# Patient Record
Sex: Female | Born: 1980 | Race: White | Hispanic: No | Marital: Married | Smoking: Never smoker
Health system: Southern US, Community
[De-identification: ages and names within clinical notes are randomized; demographics above are authoritative.]

## PROBLEM LIST (undated history)

## (undated) DIAGNOSIS — Z8742 Personal history of other diseases of the female genital tract: Secondary | ICD-10-CM

## (undated) DIAGNOSIS — S6290XA Unspecified fracture of unspecified wrist and hand, initial encounter for closed fracture: Secondary | ICD-10-CM

## (undated) DIAGNOSIS — R55 Syncope and collapse: Secondary | ICD-10-CM

## (undated) DIAGNOSIS — R5383 Other fatigue: Secondary | ICD-10-CM

## (undated) DIAGNOSIS — K469 Unspecified abdominal hernia without obstruction or gangrene: Secondary | ICD-10-CM

## (undated) DIAGNOSIS — T884XXA Failed or difficult intubation, initial encounter: Secondary | ICD-10-CM

## (undated) DIAGNOSIS — M7989 Other specified soft tissue disorders: Secondary | ICD-10-CM

## (undated) DIAGNOSIS — I499 Cardiac arrhythmia, unspecified: Secondary | ICD-10-CM

## (undated) DIAGNOSIS — M419 Scoliosis, unspecified: Secondary | ICD-10-CM

## (undated) DIAGNOSIS — H209 Unspecified iridocyclitis: Secondary | ICD-10-CM

## (undated) DIAGNOSIS — S060XAA Concussion with loss of consciousness status unknown, initial encounter: Secondary | ICD-10-CM

## (undated) DIAGNOSIS — R509 Fever, unspecified: Secondary | ICD-10-CM

## (undated) DIAGNOSIS — G43909 Migraine, unspecified, not intractable, without status migrainosus: Secondary | ICD-10-CM

## (undated) DIAGNOSIS — M542 Cervicalgia: Secondary | ICD-10-CM

## (undated) DIAGNOSIS — Z6841 Body Mass Index (BMI) 40.0 and over, adult: Secondary | ICD-10-CM

## (undated) DIAGNOSIS — R42 Dizziness and giddiness: Secondary | ICD-10-CM

## (undated) DIAGNOSIS — N912 Amenorrhea, unspecified: Secondary | ICD-10-CM

## (undated) DIAGNOSIS — M2550 Pain in unspecified joint: Secondary | ICD-10-CM

## (undated) DIAGNOSIS — M352 Behcet's disease: Secondary | ICD-10-CM

## (undated) DIAGNOSIS — F419 Anxiety disorder, unspecified: Secondary | ICD-10-CM

## (undated) DIAGNOSIS — J349 Unspecified disorder of nose and nasal sinuses: Secondary | ICD-10-CM

## (undated) DIAGNOSIS — F40240 Claustrophobia: Secondary | ICD-10-CM

## (undated) DIAGNOSIS — Z87898 Personal history of other specified conditions: Secondary | ICD-10-CM

## (undated) DIAGNOSIS — H9390 Unspecified disorder of ear, unspecified ear: Secondary | ICD-10-CM

## (undated) DIAGNOSIS — M545 Low back pain, unspecified: Secondary | ICD-10-CM

## (undated) DIAGNOSIS — I4892 Unspecified atrial flutter: Secondary | ICD-10-CM

## (undated) DIAGNOSIS — F99 Mental disorder, not otherwise specified: Secondary | ICD-10-CM

## (undated) DIAGNOSIS — R21 Rash and other nonspecific skin eruption: Secondary | ICD-10-CM

## (undated) DIAGNOSIS — R87619 Unspecified abnormal cytological findings in specimens from cervix uteri: Secondary | ICD-10-CM

## (undated) DIAGNOSIS — J392 Other diseases of pharynx: Secondary | ICD-10-CM

## (undated) DIAGNOSIS — R519 Headache, unspecified: Secondary | ICD-10-CM

## (undated) DIAGNOSIS — K219 Gastro-esophageal reflux disease without esophagitis: Secondary | ICD-10-CM

## (undated) DIAGNOSIS — M069 Rheumatoid arthritis, unspecified: Secondary | ICD-10-CM

## (undated) DIAGNOSIS — R197 Diarrhea, unspecified: Secondary | ICD-10-CM

## (undated) DIAGNOSIS — D649 Anemia, unspecified: Secondary | ICD-10-CM

## (undated) DIAGNOSIS — Z9289 Personal history of other medical treatment: Secondary | ICD-10-CM

## (undated) DIAGNOSIS — H539 Unspecified visual disturbance: Secondary | ICD-10-CM

## (undated) DIAGNOSIS — Z9889 Other specified postprocedural states: Secondary | ICD-10-CM

## (undated) DIAGNOSIS — B029 Zoster without complications: Secondary | ICD-10-CM

## (undated) DIAGNOSIS — I493 Ventricular premature depolarization: Secondary | ICD-10-CM

## (undated) DIAGNOSIS — IMO0001 Reserved for inherently not codable concepts without codable children: Secondary | ICD-10-CM

## (undated) DIAGNOSIS — R112 Nausea with vomiting, unspecified: Secondary | ICD-10-CM

## (undated) DIAGNOSIS — R002 Palpitations: Secondary | ICD-10-CM

## (undated) DIAGNOSIS — G039 Meningitis, unspecified: Secondary | ICD-10-CM

## (undated) DIAGNOSIS — R599 Enlarged lymph nodes, unspecified: Secondary | ICD-10-CM

## (undated) DIAGNOSIS — T8859XA Other complications of anesthesia, initial encounter: Secondary | ICD-10-CM

## (undated) DIAGNOSIS — N63 Unspecified lump in unspecified breast: Secondary | ICD-10-CM

## (undated) DIAGNOSIS — B009 Herpesviral infection, unspecified: Secondary | ICD-10-CM

## (undated) DIAGNOSIS — L689 Hypertrichosis, unspecified: Secondary | ICD-10-CM

## (undated) DIAGNOSIS — T7840XA Allergy, unspecified, initial encounter: Secondary | ICD-10-CM

## (undated) HISTORY — DX: Other fatigue: R53.83

## (undated) HISTORY — DX: Unspecified visual disturbance: H53.9

## (undated) HISTORY — DX: Syncope and collapse: R55

## (undated) HISTORY — DX: Anxiety disorder, unspecified: F41.9

## (undated) HISTORY — DX: Reserved for inherently not codable concepts without codable children: IMO0001

## (undated) HISTORY — DX: Dizziness and giddiness: R42

## (undated) HISTORY — DX: Personal history of other diseases of the female genital tract: Z87.42

## (undated) HISTORY — DX: Unspecified atrial flutter: I48.92

## (undated) HISTORY — DX: Headache, unspecified: R51.9

## (undated) HISTORY — DX: Other specified soft tissue disorders: M79.89

## (undated) HISTORY — DX: Zoster without complications: B02.9

## (undated) HISTORY — DX: Unspecified fracture of unspecified hand, initial encounter for closed fracture: S62.90XA

## (undated) HISTORY — DX: Failed or difficult intubation, initial encounter: T88.4XXA

## (undated) HISTORY — DX: Amenorrhea, unspecified: N91.2

## (undated) HISTORY — DX: Claustrophobia: F40.240

## (undated) HISTORY — DX: Low back pain, unspecified: M54.50

## (undated) HISTORY — DX: Unspecified lump in unspecified breast: N63.0

## (undated) HISTORY — DX: Rash and other nonspecific skin eruption: R21

## (undated) HISTORY — DX: Unspecified disorder of ear, unspecified ear: H93.90

## (undated) HISTORY — DX: Cardiac arrhythmia, unspecified: I49.9

## (undated) HISTORY — DX: Unspecified disorder of nose and nasal sinuses: J34.9

## (undated) HISTORY — DX: Cervicalgia: M54.2

## (undated) HISTORY — DX: Unspecified iridocyclitis: H20.9

## (undated) HISTORY — DX: Other diseases of pharynx: J39.2

## (undated) HISTORY — DX: Palpitations: R00.2

## (undated) HISTORY — DX: Anemia, unspecified: D64.9

## (undated) HISTORY — DX: Unspecified abnormal cytological findings in specimens from cervix uteri: R87.619

## (undated) HISTORY — DX: Personal history of other medical treatment: Z92.89

## (undated) HISTORY — DX: Pain in unspecified joint: M25.50

## (undated) HISTORY — DX: Other complications of anesthesia, initial encounter: T88.59XA

## (undated) HISTORY — DX: Behcet's disease: M35.2

## (undated) HISTORY — DX: Migraine, unspecified, not intractable, without status migrainosus: G43.909

## (undated) HISTORY — DX: Herpesviral infection, unspecified: B00.9

## (undated) HISTORY — DX: Rheumatoid arthritis, unspecified: M06.9

## (undated) HISTORY — DX: Concussion with loss of consciousness status unknown, initial encounter: S06.0XAA

## (undated) HISTORY — DX: Personal history of other specified conditions: Z87.898

## (undated) HISTORY — DX: Ventricular premature depolarization: I49.3

## (undated) HISTORY — DX: Mental disorder, not otherwise specified: F99

## (undated) HISTORY — DX: Unspecified abdominal hernia without obstruction or gangrene: K46.9

## (undated) HISTORY — PX: HYSTEROSCOPY: SHX211

## (undated) HISTORY — DX: Hypertrichosis, unspecified: L68.9

## (undated) HISTORY — PX: DILATION AND CURETTAGE, DIAGNOSTIC / THERAPEUTIC: SUR384

## (undated) HISTORY — PX: OTHER SURGICAL HISTORY: SHX169

## (undated) HISTORY — DX: Allergy, unspecified, initial encounter: T78.40XA

## (undated) HISTORY — PX: CHOLECYSTECTOMY: SHX55

## (undated) HISTORY — PX: ABDOMINAL HYSTERECTOMY: SHX81

---
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
## 2003-11-08 DIAGNOSIS — Z9889 Other specified postprocedural states: Secondary | ICD-10-CM

## 2003-11-08 HISTORY — PX: CARDIAC ABLATION: SHX51081

## 2003-11-08 HISTORY — DX: Other specified postprocedural states: Z98.890

## 2004-03-07 HISTORY — PX: OTHER SURGICAL HISTORY: SHX169

## 2007-11-08 HISTORY — PX: UPPER GASTROINTESTINAL ENDOSCOPY: SHX188

## 2007-11-08 HISTORY — PX: CHOLECYSTECTOMY: SHX55

## 2008-11-07 HISTORY — PX: D&C DIAGNOSTIC: SHX510210

## 2008-11-07 HISTORY — PX: DILATION AND CURETTAGE OF UTERUS: SHX78

## 2009-11-07 DIAGNOSIS — G039 Meningitis, unspecified: Secondary | ICD-10-CM

## 2009-11-07 HISTORY — PX: HYSTEROSCOPY: SHX211

## 2009-11-07 HISTORY — PX: PELVIC LAPAROSCOPY: SHX162

## 2009-11-07 HISTORY — DX: Meningitis, unspecified: G03.9

## 2010-11-07 HISTORY — PX: HYSTERECTOMY: SHX81

## 2012-03-05 ENCOUNTER — Ambulatory Visit (INDEPENDENT_AMBULATORY_CARE_PROVIDER_SITE_OTHER): Payer: 59 | Admitting: Physician Assistant

## 2012-03-05 ENCOUNTER — Encounter: Payer: Self-pay | Admitting: Physician Assistant

## 2012-03-05 VITALS — BP 142/81 | HR 98 | Temp 98.8°F | Ht 68.0 in | Wt 273.0 lb

## 2012-03-05 DIAGNOSIS — T7840XA Allergy, unspecified, initial encounter: Secondary | ICD-10-CM

## 2012-03-05 DIAGNOSIS — M7752 Other enthesopathy of left foot: Secondary | ICD-10-CM

## 2012-03-05 DIAGNOSIS — J309 Allergic rhinitis, unspecified: Secondary | ICD-10-CM

## 2012-03-05 DIAGNOSIS — L680 Hirsutism: Secondary | ICD-10-CM

## 2012-03-05 DIAGNOSIS — L689 Hypertrichosis, unspecified: Secondary | ICD-10-CM

## 2012-03-05 DIAGNOSIS — J302 Other seasonal allergic rhinitis: Secondary | ICD-10-CM

## 2012-03-05 MED ORDER — PREDNISONE 10 MG PO TABS: 20.0000 mg | ORAL_TABLET | Freq: Every day | ORAL | Status: DC

## 2012-03-05 NOTE — Progress Notes (Signed)
  Subjective:    Patient ID: Dana Meyer, female    DOB: 06-27-1981, 31 y.o.   MRN: [id number]  HPI Patient presents to the clinic to establish care. Past medical history was reviewed. 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 She has EpiPen but has never had to use it. Saturday night she was at a party and she did see a bowl of pineapple sitting on a table. She attempted to stay away from that hoping that she would not come in contact with the pineapple residually. That night she started to fill her the swelling and having difficulty swallowing. She initially started on Benadryl and was taking Benadryl twice a day until today's appointment. The Benadryl has helped the swelling of the lid but she feels like it is still difficult to swallow. She denies any trouble breathing. She has been able to eat and swallow it and states that she can't even swallow a pill of her needed to get her pills or tablets. She also at talks about swelling of her lymph node under her chin and a this is a common occurrence when she encounters through the she's allergic to.  She also has a bump on the back of her left heel that is painful when she is exercising. She has been exercising more frequently and more strenuously because she is training to be an [employer]. It is not painful to touch but only when her shoe is rubbing against the hard knot.      Review of Systems     Objective:   Physical Exam  Constitutional: She is oriented to person, place, and time. She appears well-developed and well-nourished.  HENT:  Head: Normocephalic and atraumatic.  Right Ear: External ear normal.  Left Ear: External ear normal.  Nose: Nose normal.  Mouth/Throat: Oropharynx is clear and moist. No oropharyngeal exudate.       TM's normal. No swelling noted in the oropharynx.  Eyes: Conjunctivae are normal.  Neck: Normal range of motion. Neck supple.       Enlarged lymphnode under the  chin no tender to palpation.  Cardiovascular: Normal rate, regular rhythm, normal heart sounds and intact distal pulses.   Pulmonary/Chest: Effort normal and breath sounds normal. She has no wheezes.  Musculoskeletal:       1cm fluctuant bursa on the left heel.  Lymphadenopathy:    She has cervical adenopathy.  Neurological: She is alert and oriented to person, place, and time.  Skin: Skin is warm and dry.  Psychiatric: She has a normal mood and affect. Her behavior is normal.          Assessment & Plan:   Allergic reaction- Gave prednisone taper over next 10 days. Continue to take benadryl for next 24 hours. Call if not improving. Use epi pen if ever have an episode where feels like throat is going to close up and not able to breathe.   Calcaneal bursitis on heel- Instructed patient to ice after activity and take ibuprofen as needed for pain and when going to be exercising for long periods of time. Consider putting gauze over bursa to avoid rubbing.

## 2012-03-05 NOTE — Patient Instructions (Addendum)
STart prednisone 2 tabs for 5 days and then 1 tab for 5 days. Call if not improving. Ice heel, take ibuprofen as needed for pain and swelling Consider using cushion in running shoes to protect from constant rubbing.

## 2012-09-26 ENCOUNTER — Encounter (HOSPITAL_COMMUNITY): Payer: Self-pay

## 2012-09-26 ENCOUNTER — Emergency Department (INDEPENDENT_AMBULATORY_CARE_PROVIDER_SITE_OTHER): Payer: 59

## 2012-09-26 ENCOUNTER — Emergency Department (HOSPITAL_COMMUNITY)
Admission: EM | Admit: 2012-09-26 | Discharge: 2012-09-26 | Disposition: A | Discharge status: Home / Self Care | Payer: 59 | Source: Home / Self Care | Attending: Emergency Medicine | Admitting: Emergency Medicine

## 2012-09-26 DIAGNOSIS — R0789 Other chest pain: Secondary | ICD-10-CM

## 2012-09-26 NOTE — ED Provider Notes (Signed)
Chief Complaint  Patient presents with  . Chest Pain    History of Present Illness:   Dana Meyer is a 31 year old female who has had a five-day history of pain in the left lateral chest area associated with numbness and tingling. Before this began when her dog jumped up on her, striking her in the chest. She did not fall down. It hurts worse if she lies on that side or her back sweats, takes a deep breath, or moves it is better with ice. She denies any shortness of breath, coughing, wheezing, or hemoptysis. She has not had any fever or chills. She denies any anterior chest pain, palpitations, dizziness, or syncope. She denies any abdominal pain, nausea, or vomiting. She has a history of a cardiac arrhythmia which was treated with a catheter ablation in 2005 at the [facility] in [place]. She's allergic to oxycodone. She has a history of allergies, migraines, and postcholecystectomy syndrome. She takes a number of medications. She is status post hysterectomy.  Review of Systems:  Other than noted above, the patient denies any of the following symptoms. Systemic:  No fever, chills, sweats, or fatigue. ENT:  No nasal congestion, rhinorrhea, or sore throat. Pulmonary:  No cough, wheezing, shortness of breath, sputum production, hemoptysis. Cardiac:  No palpitations, rapid heartbeat, dizziness, presyncope or syncope. GI:  No abdominal pain, heartburn, nausea, or vomiting. Ext:  No leg pain or swelling.  PMFSH:  Past medical history, family history, social history, meds, and allergies were reviewed and updated as needed.   Physical Exam:   Vital signs:  BP 140/96  Pulse 87  Temp 98.5 F (36.9 C) (Oral)  Resp 20  SpO2 100% Gen:  Alert, oriented, in no distress, skin warm and dry. Eye:  PERRL, lids and conjunctivas normal.  Sclera non-icteric. ENT:  Mucous membranes moist, pharynx clear. Neck:  Supple, no adenopathy or tenderness.  No JVD. Lungs:  Clear to auscultation, no wheezes,  rales or rhonchi.  No respiratory distress. Heart:  Regular rhythm.  No gallops, murmers, clicks or rubs. Chest:  There was chest wall tenderness to palpation in the left anterolateral chest area without any swelling, bruising, or deformity. Abdomen:  Soft, nontender, no organomegaly or mass.  Bowel sounds normal.  No pulsatile abdominal mass or bruit. Ext:  No edema.  No calf tenderness and Homann's sign negative.  Pulses full and equal. Skin:  Warm and dry.  No rash.  Radiology:  Dg Ribs Unilateral W/chest Left  09/26/2012  *RADIOLOGY REPORT*  Clinical Data: Left chest pain.  Remote history of rib fractures.  LEFT RIBS AND CHEST - 3+ VIEW  Comparison: None.  Findings: No pneumothorax or pleural effusion. Cardiac and mediastinal contours appear unremarkable.  The lungs appear clear.  No rib fracture is identified.  IMPRESSION: 1.  No acute findings. Please note that nondisplaced rib fractures can be occult on conventional radiography.   Original Report Authenticated By: Gaylyn Rong, M.D.    I reviewed the images independently and personally and concur with the radiologist's findings.  EKG:   Date: 09/26/2012  Rate: 78  Rhythm: normal sinus rhythm  QRS Axis: normal  Intervals: normal  ST/T Wave abnormalities: normal  Conduction Disutrbances:none  Narrative Interpretation: Normal sinus rhythm, normal EKG.  Old EKG Reviewed: none available  Assessment:  The encounter diagnosis was Musculoskeletal chest pain.  Plan:   1.  The following meds were prescribed:   New Prescriptions   No medications on file   2.  The patient was instructed in symptomatic care and handouts were given. 3.  The patient was told to return if becoming worse in any way, if no better in 3 or 4 days, and given some red flag symptoms that would indicate earlier return.    Reuben Likes, MD 09/26/12 2258

## 2012-09-26 NOTE — ED Notes (Signed)
States she has been having pain and numbness  in her left lateral upper rib area. Injury from dog jumpping up on her 11-16, denies other injury , denies rash

## 2012-11-07 HISTORY — PX: GANGLION CYST EXCISION: SHX1691

## 2013-08-25 ENCOUNTER — Encounter (HOSPITAL_COMMUNITY): Payer: Self-pay | Admitting: Emergency Medicine

## 2013-08-25 ENCOUNTER — Emergency Department (HOSPITAL_COMMUNITY)
Admission: EM | Admit: 2013-08-25 | Discharge: 2013-08-25 | Disposition: A | Discharge status: Home / Self Care | Payer: 59 | Attending: Emergency Medicine | Admitting: Emergency Medicine

## 2013-08-25 DIAGNOSIS — W268XXA Contact with other sharp object(s), not elsewhere classified, initial encounter: Secondary | ICD-10-CM | POA: Insufficient documentation

## 2013-08-25 DIAGNOSIS — S91109A Unspecified open wound of unspecified toe(s) without damage to nail, initial encounter: Secondary | ICD-10-CM | POA: Insufficient documentation

## 2013-08-25 DIAGNOSIS — Z79899 Other long term (current) drug therapy: Secondary | ICD-10-CM | POA: Insufficient documentation

## 2013-08-25 DIAGNOSIS — S99921D Unspecified injury of right foot, subsequent encounter: Secondary | ICD-10-CM

## 2013-08-25 DIAGNOSIS — Z872 Personal history of diseases of the skin and subcutaneous tissue: Secondary | ICD-10-CM | POA: Insufficient documentation

## 2013-08-25 DIAGNOSIS — L089 Local infection of the skin and subcutaneous tissue, unspecified: Secondary | ICD-10-CM

## 2013-08-25 DIAGNOSIS — Y939 Activity, unspecified: Secondary | ICD-10-CM | POA: Insufficient documentation

## 2013-08-25 DIAGNOSIS — Y929 Unspecified place or not applicable: Secondary | ICD-10-CM | POA: Insufficient documentation

## 2013-08-25 MED ORDER — NAPROXEN 500 MG PO TABS: 500.0000 mg | ORAL_TABLET | Freq: Two times a day (BID) | ORAL | Status: DC

## 2013-08-25 MED ORDER — CLINDAMYCIN HCL 150 MG PO CAPS: 450.0000 mg | ORAL_CAPSULE | Freq: Three times a day (TID) | ORAL | Status: DC

## 2013-08-25 MED ORDER — LIDOCAINE-EPINEPHRINE-TETRACAINE (LET) SOLUTION
3.0000 mL | Freq: Once | NASAL | Status: AC
  Administered 2013-08-25: 3 mL via TOPICAL
  Filled 2013-08-25: qty 3

## 2013-08-25 NOTE — ED Provider Notes (Signed)
CSN: [id number]     Arrival date & time 08/25/13  1522 History  This chart was scribed for non-physician practitioner Raymon Mutton, PA-C, working with Raeford Razor, MD, by Yevette Edwards, ED Scribe. This patient was seen in room WTR7/WTR7 and the patient's care was started at 3:51 PM.   First MD Initiated Contact with Patient 08/25/13 1534     Chief Complaint  Patient presents with  . Toe Injury    The history is provided by the patient. No language interpreter was used.   HPI Comments: Dana Meyer is a 32 y.o. female who presents to the Emergency Department complaining of an injury to her right first toe which occurred two weeks ago when  a full bottle of wine was dropped upon her toe. She received a laceration to her toe, and she was treated by Dr. [place] at a walk-in clinic three days following the injury. Dr. [place] found a sliver of glass in her toe, but the pt did not receive any stitches or antibiotics. She did receive a tetanus update as well as an x-ray of her foot. The pt had a follow-up at the walk-in clinic last week, and the toe seemed to be healing well. However, she states that two days ago the pain increased and that last night redness developed around the affected toe. The pt rates the current pain as 6/10, and she reports the pain is "achey" and is more intense than the pain associated with the initial injury. The pt also reports intermittent numbness to her toe.She has also experienced intermittent pain to the sole of her foot which she attributes to overcompensation as she is walking. The pt has been cleaning the site once a day with peroxide, and she covers the site with neosporin and a bandage. She has not had a fever since the original incident. She also denies any chills, SOB, or chest pains. The pt denies smoking.   Past Medical History  Diagnosis Date  . Allergy   . Excessive hair growth    Past Surgical History  Procedure Laterality Date  . Abdominal  hysterectomy    . Dilation and curettage, diagnostic / therapeutic    . Cholecystectomy    . Heart cath albaltion  5/05  . Wisdom tooth extraction  1998   Family History  Problem Relation Age of Onset  . Adopted: Yes   History  Substance Use Topics  . Smoking status: Never Smoker   . Smokeless tobacco: Not on file  . Alcohol Use: 0.5 oz/week    1 drink(s) per week   No OB history provided.  Review of Systems  Constitutional: Negative for chills.  HENT: Negative for trouble swallowing.   Respiratory: Negative for shortness of breath.   Cardiovascular: Negative for chest pain.  Musculoskeletal: Positive for arthralgias and gait problem.  Neurological: Positive for numbness.  All other systems reviewed and are negative.    Allergies  Oxycodone  Home Medications   Current Outpatient Rx  Name  Route  Sig  Dispense  Refill  . acyclovir cream (ZOVIRAX) 5 %   Topical   Apply 1 application topically every 3 (three) hours.         . colesevelam (WELCHOL) 625 MG tablet   Oral   Take 1,875 mg by mouth daily.          . Eflornithine HCl (VANIQA) 13.9 % cream   Topical   Apply topically 2 (two) times daily with a meal.         .  fluticasone (FLONASE) 50 MCG/ACT nasal spray   Nasal   Place 2 sprays into the nose daily.         Marland Kitchen loratadine (CLARITIN) 10 MG tablet   Oral   Take 10 mg by mouth daily.         . clindamycin (CLEOCIN) 150 MG capsule   Oral   Take 3 capsules (450 mg total) by mouth 3 (three) times daily.   90 capsule   0   . naproxen (NAPROSYN) 500 MG tablet   Oral   Take 1 tablet (500 mg total) by mouth 2 (two) times daily.   30 tablet   0    Triage Vitals: BP 146/100  Pulse 92  Temp(Src) 99.3 F (37.4 C) (Oral)  Resp 20  SpO2 97%  Physical Exam  Nursing note and vitals reviewed. Constitutional: She is oriented to person, place, and time. She appears well-developed and well-nourished. No distress.  HENT:  Head: Normocephalic and  atraumatic.  Neck: Normal range of motion. Neck supple.  Pulmonary/Chest: Effort normal and breath sounds normal. No respiratory distress.  Musculoskeletal: Normal range of motion. She exhibits tenderness.  Full range of motion to the right foot and digits. Mild decrease in flexion of the right great toe. Pain upon palpation to the right great toe.  Neurological: She is alert and oriented to person, place, and time.  Strength 5+/5+ to lower extremities bilaterally with resistance applied, equal distribution Sensation intact with differentiation to sharp and dull touch Strength intact to digits of right foot  Skin: Skin is warm and dry.  Injury to right great toe noted. Laceration healing over well. Nail appears that it will most likely fall off. Mild pus accumulation to the cuticle of the right great toe. Mild erythema surrounding toe nail margins. Negative active drainage identified - negative discharge/drainage noted to bandage. Discomfort upon palpation. Negative red streaking.  Psychiatric: She has a normal mood and affect. Her behavior is normal.    ED Course  Procedures (including critical care time)  DIAGNOSTIC STUDIES: Oxygen Saturation is 97% on room air, normal by my interpretation.    COORDINATION OF CARE:  4:03 PM- Discussed treatment plan with patient, and the patient agreed to the plan.   4:06 PM- Consulted with Dr. Juleen China.  4:07 PM- Dr. Juleen China assessed the pt.   4:15 PM Dr. Juleen China recommended that I&D be performed to remove some pus underneath the cuticle. Recommended patient to be treated with clindamycin.  INCISION AND DRAINAGE Performed by: Raymon Mutton Consent: Verbal consent obtained. Risks and benefits: risks, benefits and alternatives were discussed Type: paronychia Body area: right great toe Anesthesia: local infiltration Incision was made with a scalpel topical anesthetic: topical LET Anesthetic total: 3 ml Drainage: blood Patient tolerance: Patient  tolerated the procedure well with no immediate complications.     Labs Review Labs Reviewed - No data to display Imaging Review No results found.  EKG Interpretation   None       MDM   1. Toe injury, right, subsequent encounter   2. Toe infection    I personally performed the services described in this documentation, which was scribed in my presence. The recorded information has been reviewed and is accurate.  Patient presenting to emergency department with toe pain does been ongoing for the past 2 weeks. Approximately 2 weeks ago patient dropped a wine bottle on her right great toe, reported that there is a laceration but she did not see any medical professionals  until 2 days after. Reported that the wound is explored a walk in clinic, reported that a small please of glass was identified. Patient was given a tetanus shot in the wound extremely cleaned. Was recommended to followup closely. Patient reported that she followed up with walking clinical week ago-reported that everything was okay and healing properly. Patient is not placed on any antibiotics. Patient reports that she's noticed that there is mild swelling to the great toe with erythema surrounding the nail bed. Alert and oriented. Swelling noted to the right great toe. Erythema surrounding out of bed. Nail appears to be lifting up-will most likely fall off. Discomfort upon palpation. Negative active drainage, pus drainage identified. Negative bleeding identified. Negative area of fluctuance identified. Negative red streaking. Patient seen and assessed by attending-recommended I&D for any possible drainage of pus. I&D performed with unsuccessful results-mainly blood drainage, negative drainage of pus. Patient stable, afebrile. Cellulitis beginning in the right great toe, toe injury identified. Discharge patient with anti-inflammatories and antibiotics. Discussed with patient to apply hot compressions. Discussed with patient proper  wound care and proper hygiene. Referred patient to podiatrist for further Center. Referred patient to PCP. Discussed with patient what symptoms to watch out for-swelling, pus drainage, bleeding, swelling to the foot, redness, once to the touch, streaking up the foot or below the foot this report back to emergency apartment immediately - strict return instructions given. Patient agreed to plan of care, understood, all questions answered.    Raymon Mutton, PA-C 08/26/13 1510

## 2013-08-25 NOTE — ED Notes (Signed)
Pt c/o red striking down rt big toe, states had an injury 2wks and tx by PCP

## 2013-08-29 NOTE — ED Provider Notes (Signed)
Medical screening examination/treatment/procedure(s) were conducted as a shared visit with non-physician practitioner(s) and myself.  I personally evaluated the patient during the encounter.  EKG Interpretation   None      32 year old female with great toe pain. Recent injury near the base of the nail the foreign body was removed. Increasing pain and drainage from underneath the cuticle. Exam is consistent with a paronychia. Plan a digital block and drainage. Short course of antibiotics. Return precautions discussed. Continued wound care and warm soaks. As needed pain medication.  Raeford Razor, MD 08/29/13 1359

## 2013-09-02 ENCOUNTER — Encounter: Payer: Self-pay | Admitting: Podiatry

## 2013-09-02 ENCOUNTER — Ambulatory Visit (INDEPENDENT_AMBULATORY_CARE_PROVIDER_SITE_OTHER): Payer: 59 | Admitting: Podiatry

## 2013-09-02 ENCOUNTER — Ambulatory Visit (INDEPENDENT_AMBULATORY_CARE_PROVIDER_SITE_OTHER): Payer: 59

## 2013-09-02 DIAGNOSIS — M79609 Pain in unspecified limb: Secondary | ICD-10-CM

## 2013-09-02 DIAGNOSIS — S90121S Contusion of right lesser toe(s) without damage to nail, sequela: Secondary | ICD-10-CM

## 2013-09-02 DIAGNOSIS — IMO0002 Reserved for concepts with insufficient information to code with codable children: Secondary | ICD-10-CM

## 2013-09-02 NOTE — Patient Instructions (Signed)

## 2013-09-02 NOTE — Progress Notes (Signed)
  Subjective:    Patient ID: Dana Meyer, female    DOB: June 22, 1981, 32 y.o.   MRN: [id number] About three weeks ago I dropped a bottle of wine on my big toe and 2 days later went to the urgent care and did x-rays and pulled some glass out and went back one week later to the ER and that is when they gave the antibotics and draining and sore and tender and red color and got a tetnatus shot HPI and did cut on it to get the draining out from the ER    Review of Systems  Constitutional: Positive for fever.       Ist weekend when the bottle of wine was dropped on it-but not since then  HENT: Positive for sinus pressure.   Eyes: Negative.   Respiratory: Negative.   Cardiovascular: Negative.   Gastrointestinal: Negative.   Endocrine: Negative.   Genitourinary: Negative.   Musculoskeletal: Negative.   Skin: Negative.   Allergic/Immunologic: Positive for food allergies. Negative for environmental allergies.       Tropical fruit  Neurological: Positive for headaches.       Migraines  Hematological: Bruises/bleeds easily.  Psychiatric/Behavioral: Negative.        Objective:   Physical Exam  This 32 year old white female presents with her husband and appears to be orientated x3.  Vascular: The DP and PT pulses are two over four bilaterally.  Neurological: Sensation intact bilaterally  Dermatological: Low-grade erythema and edema at the posterior nail fold of the right hallux noted. The right hallux nail plate is partially detached from the underlying nailbed. No active drainage noted. No malodor noted.  X-ray report see attached report demonstrates small periosteal irritation of the distal phalanx of right hallux.      Assessment & Plan:    Assessment: Contusion right hallux with possible small avulsion fracture distal phalanx right hallux versus possible retained foreign body.  Plan: Offered patient avulsion of right hallux nail to decompress the toe. Patient verbally  consents. The right hallux  blocked with 3 cc of 50-50 mixture of 2% plain Xylocaine and 0.5% plain Marcaine. The toe was prepped with Betadine and exsanguinated. The right hallux nail is avulsed and evidence of some hematoma was noted. An antibiotic dressing was applied. The tourniquet was released and spontaneous capillary filling time noted to the right hallux.  Postoperative oral and written instructions provided. Patient will complete previous antibiotics. She has a surgical shoe which I suggested that she wear. Suggest Naprosyn when necessary for pain control. Reappoint at patient's request.  Richard C.Leeanne Deed, DPM

## 2013-09-16 ENCOUNTER — Encounter: Payer: Self-pay | Admitting: Podiatry

## 2013-09-16 ENCOUNTER — Ambulatory Visit: Payer: 59 | Admitting: Podiatry

## 2013-09-16 ENCOUNTER — Ambulatory Visit (INDEPENDENT_AMBULATORY_CARE_PROVIDER_SITE_OTHER): Payer: 59 | Admitting: Podiatry

## 2013-09-16 VITALS — BP 127/83 | HR 83 | Resp 16

## 2013-09-16 DIAGNOSIS — L259 Unspecified contact dermatitis, unspecified cause: Secondary | ICD-10-CM

## 2013-09-16 NOTE — Patient Instructions (Signed)
Discontinue the use of topical antibiotic, and replaced with Vaseline. Rub 1% hydrocortisone cream 4 times a day to the skin on the right great toe.

## 2013-09-16 NOTE — Progress Notes (Signed)
Patient ID: Dana Meyer, female   DOB: 10-Feb-1981, 32 y.o.   MRN: [id number] Subjective: Followup for oval-shaped of right hallux nail on 09/02/2013. Patient complaining of itching on the skin of the right hallux. She relates it to the use of latex dressings.  Objective: Scaling dry blisters on the dorsum the right hallux. Right hallux nailbed slightly moist. Some excoriations noted on the second right toe.  Assessment: Contact dermatitis right hallux. Satisfactory appearance of operative site.  Plan: DC latex dressings, topical antibiotic ointment. Rub Vaseline into right hallux nailbed and use over-the-counter 1% hydrocortisone cream into the skin on the right hallux 4 times a day until itching stops. Reappoint at patient's request.  Richard C.Leeanne Deed, DPM

## 2014-02-19 ENCOUNTER — Ambulatory Visit (INDEPENDENT_AMBULATORY_CARE_PROVIDER_SITE_OTHER): Payer: 59

## 2014-02-19 ENCOUNTER — Encounter: Payer: Self-pay | Admitting: Podiatry

## 2014-02-19 ENCOUNTER — Ambulatory Visit (INDEPENDENT_AMBULATORY_CARE_PROVIDER_SITE_OTHER): Payer: 59 | Admitting: Podiatry

## 2014-02-19 VITALS — Ht 68.0 in | Wt 270.0 lb

## 2014-02-19 DIAGNOSIS — M659 Synovitis and tenosynovitis, unspecified: Secondary | ICD-10-CM

## 2014-02-19 DIAGNOSIS — M7750 Other enthesopathy of unspecified foot: Secondary | ICD-10-CM

## 2014-02-19 DIAGNOSIS — M7660 Achilles tendinitis, unspecified leg: Secondary | ICD-10-CM

## 2014-02-19 NOTE — Patient Instructions (Addendum)
Wear Cam Walker boot on right foot except when showering, sleeping, and driving x6 weeks.

## 2014-02-19 NOTE — Progress Notes (Signed)
   Subjective:    Patient ID: Dana Meyer, female    DOB: August 31, 1981, 33 y.o.   MRN: [id number]  HPI Comments: N scar tissue L right posterior heel D painful 1 month O surgery to remove cyst 03/2013 with another medical office C hard, thickening, painful A enclosed shoes with backs, sleeping - wakens pt T warm showers loosen up, ice for pain  The patient describes an excision of the soft tissue mass on the posterior right heel approximately year ago. She said that the area was improved until approximately a month ago when it became painful and swollen. She would like to have the area evaluated. She has not returned to the original treating surgeon.  Review of Systems  HENT: Positive for sinus pressure.   All other systems reviewed and are negative.      Objective:   Physical Exam Orientated x123 33 year old white female  Vascular: DP and PT pulses are 2/4 bilaterally  Neurological: Ankle reflexes reactive bilaterally  Dermatological: There is a low-grade edema on the posterior right heel with a well-healed surgical incision noted in the area. There are no obvious palpable lesions in the area.  Musculoskeletal: Patient is able to heel off unilaterally and bilaterally. There is palpable tenderness in the posterior right heel over the edematous area. There are no palpable defects in the right tendo Achilles.  X-ray report right foot  Intact bony structure without fracture or dislocation noted. Posterior and inferior calcaneal spurs noted.  Radiographic impression: No acute bony abnormality noted right foot Posterior and inferior calcaneal spurs     Assessment & Plan:   Assessment: Possible reoccurrence of soft tissue mass Possible surgical site inflammation with possible retained foreign body, and a suture. Posterior right heel spur  Plan:  Patient is placed in Cam Walker boot to be worn on the right foot with the exception of dry again and sleeping.  Reappoint x6  weeks

## 2014-02-20 ENCOUNTER — Encounter: Payer: Self-pay | Admitting: Podiatry

## 2014-04-09 ENCOUNTER — Ambulatory Visit (INDEPENDENT_AMBULATORY_CARE_PROVIDER_SITE_OTHER): Payer: 59 | Admitting: Podiatry

## 2014-04-09 ENCOUNTER — Encounter: Payer: Self-pay | Admitting: Podiatry

## 2014-04-09 VITALS — BP 122/88 | HR 69 | Resp 14 | Ht 68.0 in | Wt 275.0 lb

## 2014-04-09 DIAGNOSIS — M7660 Achilles tendinitis, unspecified leg: Secondary | ICD-10-CM

## 2014-04-10 NOTE — Progress Notes (Signed)
Patient ID: Dana Meyer, female   DOB: [birth year], 33 y.o.   MRN: [id number]  Subjective: The patient describes an excision of the soft tissue mass on the posterior right heel approximately year ago. She said that the area was improved until approximately a month ago(02/20/2014) when it became painful and swollen. She would like to have the area evaluated. She has not returned to the original treating surgeon.  On the initial visit of February 20, 2014 a Cam Walker boot was dispensed. She still complaining of tenderness in the posterior right heel after wearing the Cam Walker boot since 02/20/2014.  Objective: Orientated x3 space white female  Dermatological: Mild posterior edema posterior right heel with a faint well-healed surgical incisions noted. Palpation of this area duplicates patient's area of discomfort.  Assessment: Edema posterior right heel versus recurrence of soft tissue lesion  Plan: This patient has not responded to immobilization I am referring patient to Dr. Alvan Dame for further evaluation and treatment as indicated.  Schedule for followup appointment for Dr. Alvan Dame

## 2014-04-25 ENCOUNTER — Ambulatory Visit (INDEPENDENT_AMBULATORY_CARE_PROVIDER_SITE_OTHER): Payer: 59

## 2014-04-25 VITALS — BP 138/89 | HR 97 | Resp 16

## 2014-04-25 DIAGNOSIS — M7660 Achilles tendinitis, unspecified leg: Secondary | ICD-10-CM

## 2014-04-25 DIAGNOSIS — M7750 Other enthesopathy of unspecified foot: Secondary | ICD-10-CM

## 2014-04-25 DIAGNOSIS — L910 Hypertrophic scar: Secondary | ICD-10-CM

## 2014-04-25 DIAGNOSIS — M659 Synovitis and tenosynovitis, unspecified: Secondary | ICD-10-CM

## 2014-04-25 NOTE — Addendum Note (Signed)
Addended by: Lottie RaterPREVETTE, ASHLEY E on: 04/25/2014 03:26 PM   Modules accepted: Orders

## 2014-04-25 NOTE — Progress Notes (Signed)
   Subjective:    Patient ID: Antonietta BarcelonaChristine Crites, female    DOB: 09-03-1981, 33 y.o.   MRN: [id number]  HPI Comments: "I have been seeing Dr Leeanne Deeduchman and he said to see Dr Ralene CorkSikora. Its hurting today. I try to keep shoes off of that spot"  Follow up posterior heel right  Foot Pain      Review of Systems no systemic changes or findings since last visit     Objective:   Physical Exam 33 year old white female presents this time referral from Dr. Leeanne Deeduchman well-developed well-nourished oriented to somewhat overweight although he noted significant distress has a nodular lesion in the scar area posterior medial right heel. Patient underwent surgery for removal ganglion cyst more than a year ago and since that time has developed persistent enlargement or induration that region appears to be bursitis or nodularity every some bony prominence although x-rays with lesion marker taken at this time did not reveal any exostoses no fracturing no cyst or tumors in the calcaneus no retrocalcaneal spurring other than the lateral spur which is not correlates this area of pain. There is some tenderness in the Achilles tendon insertion site proximal to this area and some distal to the area however most significant right underlying the incision neurovascular status intact pedal pulses are palpable epicritic and proprioceptive sensations intact there is normal plantar response DTRs not listed       Assessment & Plan:  Assessment radius residual scar tissue or fibrosis in the peritendinous area the Achilles tendon insertion cannot rule out some residual bursitis. At this time discussed conservative options suggested hot compress and ice pack alternating. Also suggested physical therapy such as ultrasound iontophoresis and cross fiber massage to break a scar tissue or lesions we'll consider physical therapy for 4 weeks and then reevaluate the next 2 months. If no significant improvement may consider more invasive options  such as surgical excision of the scar tissue however we also discussed that would likely leave additional scar tissue. Steroid injections might be considered however to avoid them or around the tendinous area patient is advised to avoid any ballistic or running activities or impact activities maintain a sewer sandal that does not it or irritate the area recheck in 2 months for followup  Alvan Dameichard Sikora DPM

## 2014-04-25 NOTE — Patient Instructions (Signed)
ICE INSTRUCTIONS  Apply ice or cold pack to the affected area at least 3 times a day for 10-15 minutes each time.  You should also use ice after prolonged activity or vigorous exercise.  Do not apply ice longer than 20 minutes at one time.  Always keep a cloth between your skin and the ice pack to prevent burns.  Being consistent and following these instructions will help control your symptoms.  We suggest you purchase a gel ice pack because they are reusable and do bit leak.  Some of them are designed to wrap around the area.  Use the method that works best for you.  Here are some other suggestions for icing.   Use a frozen bag of peas or corn-inexpensive and molds well to your body, usually stays frozen for 10 to 20 minutes.  Wet a towel with cold water and squeeze out the excess until it's damp.  Place in a bag in the freezer for 20 minutes. Then remove and use.  Alternate hot compress and ice pack 10 minutes each repeat this cycle 2 or 3 times every

## 2014-05-12 ENCOUNTER — Ambulatory Visit: Payer: 59

## 2014-05-12 DIAGNOSIS — R262 Difficulty in walking, not elsewhere classified: Secondary | ICD-10-CM | POA: Diagnosis not present

## 2014-05-12 DIAGNOSIS — M25579 Pain in unspecified ankle and joints of unspecified foot: Secondary | ICD-10-CM | POA: Diagnosis not present

## 2014-05-15 ENCOUNTER — Ambulatory Visit: Payer: 59

## 2014-05-15 DIAGNOSIS — M25579 Pain in unspecified ankle and joints of unspecified foot: Secondary | ICD-10-CM | POA: Diagnosis not present

## 2014-05-20 ENCOUNTER — Ambulatory Visit: Payer: 59 | Admitting: Physical Therapy

## 2014-05-20 DIAGNOSIS — M25579 Pain in unspecified ankle and joints of unspecified foot: Secondary | ICD-10-CM | POA: Diagnosis not present

## 2014-05-23 ENCOUNTER — Encounter: Payer: 59 | Admitting: Physical Therapy

## 2014-05-26 ENCOUNTER — Ambulatory Visit: Payer: 59

## 2014-05-26 DIAGNOSIS — M25579 Pain in unspecified ankle and joints of unspecified foot: Secondary | ICD-10-CM | POA: Diagnosis not present

## 2014-05-30 ENCOUNTER — Ambulatory Visit: Payer: 59 | Admitting: Physical Therapy

## 2014-05-30 DIAGNOSIS — M25579 Pain in unspecified ankle and joints of unspecified foot: Secondary | ICD-10-CM | POA: Diagnosis not present

## 2014-06-03 ENCOUNTER — Ambulatory Visit: Payer: 59 | Admitting: Physical Therapy

## 2014-06-03 DIAGNOSIS — M25579 Pain in unspecified ankle and joints of unspecified foot: Secondary | ICD-10-CM | POA: Diagnosis not present

## 2014-06-09 ENCOUNTER — Ambulatory Visit: Payer: 59 | Attending: Physician Assistant | Admitting: Physical Therapy

## 2014-06-09 DIAGNOSIS — R262 Difficulty in walking, not elsewhere classified: Secondary | ICD-10-CM | POA: Insufficient documentation

## 2014-06-09 DIAGNOSIS — M25579 Pain in unspecified ankle and joints of unspecified foot: Secondary | ICD-10-CM | POA: Diagnosis not present

## 2014-06-12 ENCOUNTER — Ambulatory Visit: Payer: 59 | Admitting: Physical Therapy

## 2014-06-12 DIAGNOSIS — M25579 Pain in unspecified ankle and joints of unspecified foot: Secondary | ICD-10-CM | POA: Diagnosis not present

## 2014-06-18 ENCOUNTER — Ambulatory Visit (INDEPENDENT_AMBULATORY_CARE_PROVIDER_SITE_OTHER): Payer: 59

## 2014-06-18 VITALS — BP 139/87 | HR 86 | Resp 12

## 2014-06-18 DIAGNOSIS — L910 Hypertrophic scar: Secondary | ICD-10-CM

## 2014-06-18 DIAGNOSIS — M7750 Other enthesopathy of unspecified foot: Secondary | ICD-10-CM

## 2014-06-18 DIAGNOSIS — M7660 Achilles tendinitis, unspecified leg: Secondary | ICD-10-CM

## 2014-06-18 DIAGNOSIS — M659 Synovitis and tenosynovitis, unspecified: Secondary | ICD-10-CM

## 2014-06-18 DIAGNOSIS — M65979 Unspecified synovitis and tenosynovitis, unspecified ankle and foot: Secondary | ICD-10-CM

## 2014-06-18 NOTE — Patient Instructions (Signed)
ICE INSTRUCTIONS  Apply ice or cold pack to the affected area at least 3 times a day for 10-15 minutes each time.  You should also use ice after prolonged activity or vigorous exercise.  Do not apply ice longer than 20 minutes at one time.  Always keep a cloth between your skin and the ice pack to prevent burns.  Being consistent and following these instructions will help control your symptoms.  We suggest you purchase a gel ice pack because they are reusable and do bit leak.  Some of them are designed to wrap around the area.  Use the method that works best for you.  Here are some other suggestions for icing.   Use a frozen bag of peas or corn-inexpensive and molds well to your body, usually stays frozen for 10 to 20 minutes.  Wet a towel with cold water and squeeze out the excess until it's damp.  Place in a bag in the freezer for 20 minutes. Then remove and use.  Alternate applying hot compress and ice pack 10 minutes each repeat 2 or 3 times each set   Also recommend Aspercreme or bio freeze which can be obtained at the drugstore any pain creams can also be helpful applied to this area 3 times daily

## 2014-06-18 NOTE — Progress Notes (Signed)
   Subjective:    Patient ID: Dana Meyer, female    DOB: 02-13-1981, 33 y.o.   MRN: [id number]  HPI ''RT FOOT BACK FOOT IS MUCH BETTER, BUT STILL HAVING PAIN AND LESS SWELLING.'' Review of Systems None  new findings or systemic changes at this time    Objective:   Physical Exam  Lower extremity objective findings unchanged pedal pulses are palpable epicritic and proprioceptive sensations intact patient did do physical therapy and iontophoresis has had improvement although there still some tenderness along the medial posterior scar or cyst and then remove and the heel still some mild Achilles tendinitis or. Tenderness bursitis identified. This did suggest using topical pain creams hot and cold compress applications. Producing impact activities consider nonimpact activities such as swimming and yoga or Pilates     Assessment & Plan:  Assessment improved Achilles tendinitis or scar tissue retrocalcaneal bursitis. Continue with conservative topical treatments at this time I keep the Profore Advil as needed any flareups if not resolve completely within 3 months followup may be candidate for steroid injection and immobilization and last resort for the other invasive options in maintain the open back shoe such as a dance toe are clawed whenever possible. Followup in 3 months  Alvan Dameichard Jori Thrall DPM

## 2014-06-27 ENCOUNTER — Ambulatory Visit: Payer: 59

## 2014-11-07 HISTORY — PX: UMBILICAL HERNIA REPAIR: SHX2598

## 2014-11-07 HISTORY — PX: HERNIA REPAIR: SHX51

## 2014-11-15 ENCOUNTER — Emergency Department (HOSPITAL_COMMUNITY): Payer: 59

## 2014-11-15 ENCOUNTER — Emergency Department (HOSPITAL_COMMUNITY)
Admission: EM | Admit: 2014-11-15 | Discharge: 2014-11-16 | Disposition: A | Discharge status: Home / Self Care | Payer: 59 | Attending: Emergency Medicine | Admitting: Emergency Medicine

## 2014-11-15 ENCOUNTER — Encounter (HOSPITAL_COMMUNITY): Payer: Self-pay | Admitting: Emergency Medicine

## 2014-11-15 DIAGNOSIS — K420 Umbilical hernia with obstruction, without gangrene: Secondary | ICD-10-CM

## 2014-11-15 DIAGNOSIS — Z7951 Long term (current) use of inhaled steroids: Secondary | ICD-10-CM | POA: Insufficient documentation

## 2014-11-15 DIAGNOSIS — Z91040 Latex allergy status: Secondary | ICD-10-CM | POA: Diagnosis not present

## 2014-11-15 DIAGNOSIS — L03311 Cellulitis of abdominal wall: Secondary | ICD-10-CM | POA: Insufficient documentation

## 2014-11-15 DIAGNOSIS — R1033 Periumbilical pain: Secondary | ICD-10-CM

## 2014-11-15 DIAGNOSIS — Z79899 Other long term (current) drug therapy: Secondary | ICD-10-CM | POA: Insufficient documentation

## 2014-11-15 DIAGNOSIS — G43909 Migraine, unspecified, not intractable, without status migrainosus: Secondary | ICD-10-CM | POA: Insufficient documentation

## 2014-11-15 LAB — CBC WITH DIFFERENTIAL/PLATELET
Basophils Absolute: 0 10*3/uL (ref 0.0–0.1)
Basophils Relative: 0 % (ref 0–1)
Eosinophils Absolute: 0.1 10*3/uL (ref 0.0–0.7)
Eosinophils Relative: 1 % (ref 0–5)
HCT: 41.6 % (ref 36.0–46.0)
Hemoglobin: 13.8 g/dL (ref 12.0–15.0)
Lymphocytes Relative: 33 % (ref 12–46)
Lymphs Abs: 4.4 10*3/uL — ABNORMAL HIGH (ref 0.7–4.0)
MCH: 30.6 pg (ref 26.0–34.0)
MCHC: 33.2 g/dL (ref 30.0–36.0)
MCV: 92.2 fL (ref 78.0–100.0)
Monocytes Absolute: 1 10*3/uL (ref 0.1–1.0)
Monocytes Relative: 7 % (ref 3–12)
Neutro Abs: 7.8 10*3/uL — ABNORMAL HIGH (ref 1.7–7.7)
Neutrophils Relative %: 59 % (ref 43–77)
Platelets: 350 10*3/uL (ref 150–400)
RBC: 4.51 MIL/uL (ref 3.87–5.11)
RDW: 13.6 % (ref 11.5–15.5)
WBC: 13.4 10*3/uL — ABNORMAL HIGH (ref 4.0–10.5)

## 2014-11-15 LAB — COMPREHENSIVE METABOLIC PANEL
ALT: 29 U/L (ref 0–35)
AST: 24 U/L (ref 0–37)
Albumin: 4.4 g/dL (ref 3.5–5.2)
Alkaline Phosphatase: 71 U/L (ref 39–117)
Anion gap: 8 (ref 5–15)
BUN: 14 mg/dL (ref 6–23)
CO2: 26 mmol/L (ref 19–32)
Calcium: 9.3 mg/dL (ref 8.4–10.5)
Chloride: 101 mEq/L (ref 96–112)
Creatinine, Ser: 0.71 mg/dL (ref 0.50–1.10)
GFR calc Af Amer: >90 mL/min (ref >90)
GFR calc non Af Amer: >90 mL/min (ref >90)
Glucose, Bld: 87 mg/dL (ref 70–99)
Potassium: 4.2 mmol/L (ref 3.5–5.1)
Sodium: 135 mmol/L (ref 135–145)
Total Bilirubin: 0.5 mg/dL (ref 0.3–1.2)
Total Protein: 7.6 g/dL (ref 6.0–8.3)

## 2014-11-15 LAB — LACTIC ACID, PLASMA: Lactic Acid, Venous: 1.4 mmol/L (ref 0.5–2.2)

## 2014-11-15 MED ORDER — KETOROLAC TROMETHAMINE 15 MG/ML IJ SOLN
15.0000 mg | Freq: Once | INTRAMUSCULAR | Status: AC
  Administered 2014-11-15: 15 mg via INTRAVENOUS
  Filled 2014-11-15: qty 1

## 2014-11-15 MED ORDER — HYDROMORPHONE HCL 1 MG/ML IJ SOLN
0.5000 mg | Freq: Once | INTRAMUSCULAR | Status: AC
  Administered 2014-11-15: 0.5 mg via INTRAVENOUS
  Filled 2014-11-15: qty 1

## 2014-11-15 MED ORDER — SODIUM CHLORIDE 0.9 % IV BOLUS (SEPSIS)
1000.0000 mL | Freq: Once | INTRAVENOUS | Status: AC
  Administered 2014-11-15: 1000 mL via INTRAVENOUS

## 2014-11-15 MED ORDER — ONDANSETRON HCL 4 MG/2ML IJ SOLN
4.0000 mg | Freq: Once | INTRAMUSCULAR | Status: AC
  Administered 2014-11-15: 4 mg via INTRAVENOUS
  Filled 2014-11-15: qty 2

## 2014-11-15 MED ORDER — IOHEXOL 300 MG/ML  SOLN
50.0000 mL | Freq: Once | INTRAMUSCULAR | Status: AC | PRN
  Administered 2014-11-15: 50 mL via ORAL

## 2014-11-15 MED ORDER — IOHEXOL 300 MG/ML  SOLN
100.0000 mL | Freq: Once | INTRAMUSCULAR | Status: AC | PRN
  Administered 2014-11-15: 100 mL via INTRAVENOUS

## 2014-11-15 NOTE — ED Notes (Signed)
Pt aware of need of urine sample. Unable to obtain sample at this time, pt states she will attempt after IV start

## 2014-11-15 NOTE — ED Provider Notes (Signed)
CSN: [id number]     Arrival date & time 11/15/14  2051 History   None    Chief Complaint  Patient presents with  . Abdominal Pain     (Consider location/radiation/quality/duration/timing/severity/associated sxs/prior Treatment) HPI   34 year old female with abdominal pain. Onset about a week ago and getting worse with the past 24 hours. Pain is periumbilical. Does not radiate. No appreciable exacerbating/relieving factors. Nausea, no vomiting. No urinary complaints. No change in bowel movements. No fevers or chills. Surgical history significant for hysterectomy and cholecystectomy.   Past Medical History  Diagnosis Date  . Allergy   . Excessive hair growth   . Migraine    Past Surgical History  Procedure Laterality Date  . Abdominal hysterectomy    . Dilation and curettage, diagnostic / therapeutic    . Cholecystectomy    . Heart cath albaltion  5/05  . Wisdom tooth extraction  1998   Family History  Problem Relation Age of Onset  . Adopted: Yes   History  Substance Use Topics  . Smoking status: Never Smoker   . Smokeless tobacco: Never Used  . Alcohol Use: 0.5 oz/week    1 drink(s) per week     Comment: very rarely   OB History    No data available     Review of Systems  All systems reviewed and negative, other than as noted in HPI.   Allergies  Latex and Oxycodone  Home Medications   Prior to Admission medications   Medication Sig Start Date End Date Taking? Authorizing Provider  Alcaftadine (LASTACAFT) 0.25 % SOLN Apply to eye.    Historical Provider, MD  colesevelam (WELCHOL) 625 MG tablet Take 1,875 mg by mouth daily.     Historical Provider, MD  diphenhydrAMINE (BENADRYL) 2 % cream Apply 1 application topically 2 (two) times daily as needed for itching.    Historical Provider, MD  Eflornithine HCl (VANIQA) 13.9 % cream Apply topically 2 (two) times daily with a meal.    Historical Provider, MD  fluticasone (FLONASE) 50 MCG/ACT nasal spray Place 2  sprays into the nose daily.    Historical Provider, MD  loratadine (CLARITIN) 10 MG tablet Take 10 mg by mouth daily.    Historical Provider, MD  rizatriptan (MAXALT) 10 MG tablet Take 10 mg by mouth as needed for migraine. May repeat in 2 hours if needed    Historical Provider, MD   BP 124/70 mmHg  Pulse 94  Temp(Src) 98.4 F (36.9 C) (Oral)  Resp 18  Ht 5\' 8"  (1.727 m)  Wt 275 lb (124.739 kg)  BMI 41.82 kg/m2  SpO2 99% Physical Exam  Constitutional: She appears well-developed and well-nourished. No distress.  HENT:  Head: Normocephalic and atraumatic.  Eyes: Conjunctivae are normal. Right eye exhibits no discharge. Left eye exhibits no discharge.  Neck: Neck supple.  Cardiovascular: Normal rate, regular rhythm and normal heart sounds.  Exam reveals no gallop and no friction rub.   No murmur heard. Pulmonary/Chest: Effort normal and breath sounds normal. No respiratory distress.  Abdominal:  obese abdomen. Umbilical granuloma. Periumbilical tenderness. No abdominal wall defect appreciated.   Genitourinary:  No CVA tenderness.  Musculoskeletal: She exhibits no edema or tenderness.  Neurological: She is alert.  Skin: Skin is warm and dry.  Psychiatric: She has a normal mood and affect. Her behavior is normal. Thought content normal.  Nursing note and vitals reviewed.   ED Course  Procedures (including critical care time) Labs Review Labs Reviewed  CBC WITH DIFFERENTIAL  COMPREHENSIVE METABOLIC PANEL  LACTIC ACID, PLASMA  URINALYSIS, ROUTINE W REFLEX MICROSCOPIC  POC URINE PREG, ED    Imaging Review No results found.   EKG Interpretation None      MDM   Final diagnoses:  Periumbilical pain    33yF with abdominal pain. Previous surgeries, but not discrete abdominal wall defect appreciated. No urinary symptoms. No obstructive symptoms aside from mild nausea. Basic Labs. Will CT.     Raeford Razor, MD 11/17/14 1105

## 2014-11-15 NOTE — ED Notes (Signed)
Pt arrived to the ED with complaint of mid medial abdominal pain.  Pt has had multiple surgeries around the umbilicus area.  Since Monday last she has experienced pain in increasing severity at the area and internally.  Pt is concerned for a hernia.  Pt states pain has increased dramatically today.

## 2014-11-16 LAB — URINALYSIS, ROUTINE W REFLEX MICROSCOPIC
Bilirubin Urine: NEGATIVE
Glucose, UA: NEGATIVE mg/dL
Hgb urine dipstick: NEGATIVE
Ketones, ur: NEGATIVE mg/dL
Leukocytes, UA: NEGATIVE
Nitrite: NEGATIVE
Protein, ur: NEGATIVE mg/dL
Specific Gravity, Urine: 1.03 (ref 1.005–1.030)
Urobilinogen, UA: 0.2 mg/dL (ref 0.0–1.0)
pH: 5.5 (ref 5.0–8.0)

## 2014-11-16 MED ORDER — CEPHALEXIN 500 MG PO CAPS
500.0000 mg | ORAL_CAPSULE | Freq: Once | ORAL | Status: AC
  Administered 2014-11-16: 500 mg via ORAL
  Filled 2014-11-16: qty 1

## 2014-11-16 MED ORDER — HYDROCODONE-ACETAMINOPHEN 5-325 MG PO TABS: 1.0000 | ORAL_TABLET | Freq: Four times a day (QID) | ORAL | Status: DC | PRN

## 2014-11-16 MED ORDER — MORPHINE SULFATE 4 MG/ML IJ SOLN
4.0000 mg | Freq: Once | INTRAMUSCULAR | Status: AC
  Administered 2014-11-16: 4 mg via INTRAVENOUS
  Filled 2014-11-16: qty 1

## 2014-11-16 MED ORDER — CEPHALEXIN 500 MG PO CAPS: 500.0000 mg | ORAL_CAPSULE | Freq: Three times a day (TID) | ORAL | Status: AC

## 2014-11-16 MED ORDER — ONDANSETRON HCL 4 MG PO TABS: 4.0000 mg | ORAL_TABLET | Freq: Three times a day (TID) | ORAL | Status: DC | PRN

## 2014-11-16 NOTE — ED Notes (Signed)
Awake. Verbally responsive. A/O x4. Resp even and unlabored. No audible adventitious breath sounds noted. ABC's intact. Pt reported feeling nausea. IV saline lock patent and intact. Family at bedside.

## 2014-11-16 NOTE — ED Provider Notes (Signed)
**Note Dana via Obfuscation** Pt left at change of shift to get CT results.    Pt reports started getting periumbilical pain about 5-6 days ago with pain just to right of a scar in her umbilicus from prior laparoscopic surgeries. Reports nausea and increasing pain. No fever.    Abdomen soft, tender diffusely right of umbilicus with red raised lesion in umbilicus that she reports is a scar present for the past 3 years.       02:20 Pt discussed with Dr Maisie Fushomas, suggests outpatient antibiotics and see in the office.   Pt was started on keflex. She was given information to follow up with Dr Maisie Fushomas this week or return if she feels worse.    Ct Abdomen Pelvis W Contrast  11/16/2014   CLINICAL DATA:  Increasing abdominal pain for 6 days. History of hysterectomy and cholecystectomy. Assess for hernia.  EXAM: CT ABDOMEN AND PELVIS WITH CONTRAST  TECHNIQUE: Multidetector CT imaging of the abdomen and pelvis was performed using the standard protocol following bolus administration of intravenous contrast.  CONTRAST:  50mL OMNIPAQUE IOHEXOL 300 MG/ML SOLN, 100mL OMNIPAQUE IOHEXOL 300 MG/ML SOLN  COMPARISON:  Meyer.  FINDINGS: LUNG BASES: Included view of the lung bases are clear. Visualized heart and pericardium are unremarkable.  SOLID ORGANS: The liver is enlarged, 25 cm in craniocaudad dimension. Rounded subtle density in dome of the liver measures 3.5 cm. Patchy density at the level of gallbladder fossa. Liver is diffusely hypodense. Status post cholecystectomy. Spleen, pancreas, adrenal glands are unremarkable.  GASTROINTESTINAL TRACT: The stomach, small and large bowel are normal in course and caliber without inflammatory changes. The appendix is not discretely identified, however there are no inflammatory changes in the right lower quadrant.  KIDNEYS/ URINARY TRACT: Kidneys are orthotopic, demonstrating symmetric enhancement. No nephrolithiasis, hydronephrosis or solid renal masses. The unopacified ureters are normal in course and  caliber. Urinary bladder is partially distended and unremarkable.  PERITONEUM/RETROPERITONEUM: No intraperitoneal free fluid nor free air. Aortoiliac vessels are normal in course and caliber. No lymphadenopathy by CT size criteria. Small RIGHT lower quadrant lymph nodes are likely reactive. Status post hysterectomy. Bilateral adnexal benign-appearing cyst measure up to 3.2 cm on the RIGHT.  SOFT TISSUE/OSSEOUS STRUCTURES: Nonsuspicious. Small fat containing umbilical hernia. Mid abdominal mild subcutaneous fat stranding without subcutaneous gas or radiopaque foreign bodies. Symmetric severe sacroiliac periarticular sclerosis with osteophytic spurring suggests degenerative change. Levoscoliosis on this nonweightbearing examination.  IMPRESSION: Small fat containing umbilical hernia. Mild ventral abdominal wall subcutaneous fat stranding could reflect cellulitis without abscess.  Hepatomegaly. 3.5 cm subtle area of density within the dome of the liver could reflect a hemangioma, THAD, focal fatty sparing. In addition, focal fatty sparing about the gallbladder fossa. Recommend followup.   Electronically Signed   By: Awilda Metroourtnay  Bloomer   On: 11/16/2014 00:10    Diagnoses that have been ruled out:  Meyer  Diagnoses that are still under consideration:  Meyer  Final diagnoses:  Periumbilical pain  Umbilical hernia with obstruction, without gangrene  Abdominal wall cellulitis    Plan discharge  Devoria AlbeIva Clarke Amburn, MD, Franz DellFACEP   Benson Porcaro L Webster Patrone, MD 11/16/14 [phone]

## 2014-11-16 NOTE — Discharge Instructions (Signed)
Take the antibiotics as prescribed. Take the hydrocodone for pain with ibuprofen 600 mg 4 times a day. Call Dr Maurine Ministerhomas's office tomorrow morning to be seen to discuss repair of your umbilical hernia that has fat tissue in it.  Return to the ED if you get a fever, worsening pain, redness or warmth to the skin around your umbilicus or you seem worse.

## 2014-11-16 NOTE — ED Notes (Signed)
Awake. Verbally responsive. A/O x4. Resp even and unlabored. No audible adventitious breath sounds noted. ABC's intact. Pt denies n/v/d at this time. IV saline lock patent and intact. Family at bedside. 

## 2014-11-19 ENCOUNTER — Other Ambulatory Visit (INDEPENDENT_AMBULATORY_CARE_PROVIDER_SITE_OTHER): Payer: Self-pay | Admitting: Surgery

## 2014-11-20 ENCOUNTER — Encounter (HOSPITAL_COMMUNITY)
Admission: RE | Admit: 2014-11-20 | Discharge: 2014-11-20 | Disposition: A | Discharge status: Home / Self Care | Payer: 59 | Source: Ambulatory Visit | Attending: Surgery | Admitting: Surgery

## 2014-11-20 ENCOUNTER — Encounter (HOSPITAL_COMMUNITY): Payer: Self-pay

## 2014-11-20 DIAGNOSIS — L928 Other granulomatous disorders of the skin and subcutaneous tissue: Secondary | ICD-10-CM | POA: Diagnosis not present

## 2014-11-20 DIAGNOSIS — R011 Cardiac murmur, unspecified: Secondary | ICD-10-CM | POA: Diagnosis not present

## 2014-11-20 DIAGNOSIS — K429 Umbilical hernia without obstruction or gangrene: Secondary | ICD-10-CM | POA: Diagnosis not present

## 2014-11-20 DIAGNOSIS — G43909 Migraine, unspecified, not intractable, without status migrainosus: Secondary | ICD-10-CM | POA: Diagnosis not present

## 2014-11-20 HISTORY — DX: Other specified postprocedural states: Z98.890

## 2014-11-20 HISTORY — DX: Nausea with vomiting, unspecified: R11.2

## 2014-11-20 LAB — CBC
HCT: 42.5 % (ref 36.0–46.0)
HEMOGLOBIN: 13.8 g/dL (ref 12.0–15.0)
MCH: 30.3 pg (ref 26.0–34.0)
MCHC: 32.5 g/dL (ref 30.0–36.0)
MCV: 93.4 fL (ref 78.0–100.0)
Platelets: 345 10*3/uL (ref 150–400)
RBC: 4.55 MIL/uL (ref 3.87–5.11)
RDW: 13.7 % (ref 11.5–15.5)
WBC: 9.2 10*3/uL (ref 4.0–10.5)

## 2014-11-20 MED ORDER — DEXTROSE 5 % IV SOLN
3.0000 g | INTRAVENOUS | Status: AC
  Administered 2014-11-21: 3 g via INTRAVENOUS
  Filled 2014-11-20: qty 3000

## 2014-11-20 NOTE — Progress Notes (Signed)
Clinic note, EP Cath Procedure note on chart from 2005 from [place].  Dr Leta JunglingEwell made aware of patient history  Of Ablation.  EKG done 11/20/14 states Sinus Rhythm.  No further orders given.

## 2014-11-20 NOTE — Progress Notes (Addendum)
Obtained Medical Release form from patient to obtain Records from Dr Konrad PentaPlumb in [place].  Sent medical Release form by fax to Dr Susanne BordersVance Plumb ( Medical Records ) to obtain information related to heart ablation.  Also called and spoke with Medical Records at office of Dr Konrad PentaPlumb.

## 2014-11-20 NOTE — Patient Instructions (Addendum)
Dana Meyer  11/20/2014   Your procedure is scheduled on:   Report to [facility]  Entrance and follow signs to               Short Stay Center at AM.  Call this number if you have problems the morning of surgery [phone]   Remember:  Do not eat food after midnite.  May have clear liquids until 0630am then nothing by mouth.       Take these medicines the morning of surgery with A SIP OF WATER: Flonase nasal spray, Refresh eye drops and Restasis eye drops                                You may not have any metal on your body including hair pins and              piercings  Do not wear jewelry, make-up, lotions, powders or perfumes.             Do not wear nail polish.  Do not shave  48 hours prior to surgery.              Men may shave face and neck.   Do not bring valuables to the hospital. [facility] IS NOT             RESPONSIBLE   FOR VALUABLES.  Contacts, dentures or bridgework may not be worn into surgery.  Leave suitcase in the car. After surgery it may be brought to your room.     Patients discharged the day of surgery will not be allowed to drive home.  Name and phone number of your driver:  Special Instructions: N/A              Please read over the following fact sheets you were given: _____________________________________________________________________             [facility] - Preparing for Surgery Before surgery, you can play an important role.  Because skin is not sterile, your skin needs to be as free of germs as possible.  You can reduce the number of germs on your skin by washing with CHG (chlorahexidine gluconate) soap before surgery.  CHG is an antiseptic cleaner which kills germs and bonds with the skin to continue killing germs even after washing. Please DO NOT use if you have an allergy to CHG or antibacterial soaps.  If your skin becomes reddened/irritated stop using the CHG and inform your nurse when you arrive at  Short Stay. Do not shave (including legs and underarms) for at least 48 hours prior to the first CHG shower.  You may shave your face/neck. Please follow these instructions carefully:  1.  Shower with CHG Soap the night before surgery and the  morning of Surgery.  2.  If you choose to wash your hair, wash your hair first as usual with your  normal  shampoo.  3.  After you shampoo, rinse your hair and body thoroughly to remove the  shampoo.                           4.  Use CHG as you would any other liquid soap.  You can apply chg directly  to the skin and wash  Gently with a scrungie or clean washcloth.  5.  Apply the CHG Soap to your body ONLY FROM THE NECK DOWN.   Do not use on face/ open                           Wound or open sores. Avoid contact with eyes, ears mouth and genitals (private parts).                       Wash face,  Genitals (private parts) with your normal soap.             6.  Wash thoroughly, paying special attention to the area where your surgery  will be performed.  7.  Thoroughly rinse your body with warm water from the neck down.  8.  DO NOT shower/wash with your normal soap after using and rinsing off  the CHG Soap.                9.  Pat yourself dry with a clean towel.            10.  Wear clean pajamas.            11.  Place clean sheets on your bed the night of your first shower and do not  sleep with pets. Day of Surgery : Do not apply any lotions/deodorants the morning of surgery.  Please wear clean clothes to the hospital/surgery center.  FAILURE TO FOLLOW THESE INSTRUCTIONS MAY RESULT IN THE CANCELLATION OF YOUR SURGERY PATIENT SIGNATURE_________________________________  NURSE SIGNATURE__________________________________  ________________________________________________________________________    CLEAR LIQUID DIET   Foods Allowed                                                                     Foods Excluded  Coffee and tea,  regular and decaf                             liquids that you cannot  Plain Jell-O in any flavor                                             see through such as: Fruit ices (not with fruit pulp)                                     milk, soups, orange juice  Iced Popsicles                                    All solid food Carbonated beverages, regular and diet                                    Cranberry, grape and apple juices Sports drinks like Gatorade Lightly seasoned clear broth or consume(fat free) Sugar, honey syrup  Sample Menu Breakfast                                Lunch                                     Supper Cranberry juice                    Beef broth                            Chicken broth Jell-O                                     Grape juice                           Apple juice Coffee or tea                        Jell-O                                      Popsicle                                                Coffee or tea                        Coffee or tea  _____________________________________________________________________

## 2014-11-20 NOTE — Progress Notes (Signed)
CMP- 11/15/14 in EPIC.

## 2014-11-21 ENCOUNTER — Ambulatory Visit (HOSPITAL_COMMUNITY): Payer: 59 | Admitting: Certified Registered Nurse Anesthetist

## 2014-11-21 ENCOUNTER — Encounter (HOSPITAL_COMMUNITY): Payer: Self-pay | Admitting: *Deleted

## 2014-11-21 ENCOUNTER — Encounter (HOSPITAL_COMMUNITY)
Admission: RE | Disposition: A | Discharge status: Home / Self Care | Payer: Self-pay | Source: Ambulatory Visit | Attending: Surgery

## 2014-11-21 ENCOUNTER — Ambulatory Visit (HOSPITAL_COMMUNITY)
Admission: RE | Admit: 2014-11-21 | Discharge: 2014-11-21 | Disposition: A | Discharge status: Home / Self Care | Payer: 59 | Source: Ambulatory Visit | Attending: Surgery | Admitting: Surgery

## 2014-11-21 DIAGNOSIS — R011 Cardiac murmur, unspecified: Secondary | ICD-10-CM | POA: Insufficient documentation

## 2014-11-21 DIAGNOSIS — G43909 Migraine, unspecified, not intractable, without status migrainosus: Secondary | ICD-10-CM | POA: Insufficient documentation

## 2014-11-21 DIAGNOSIS — L928 Other granulomatous disorders of the skin and subcutaneous tissue: Secondary | ICD-10-CM | POA: Insufficient documentation

## 2014-11-21 DIAGNOSIS — K429 Umbilical hernia without obstruction or gangrene: Secondary | ICD-10-CM | POA: Diagnosis not present

## 2014-11-21 HISTORY — PX: UMBILICAL HERNIA REPAIR: SHX196

## 2014-11-21 HISTORY — DX: Cardiac arrhythmia, unspecified: I49.9

## 2014-11-21 SURGERY — REPAIR, HERNIA, UMBILICAL, ADULT
Anesthesia: General

## 2014-11-21 MED ORDER — SODIUM CHLORIDE 0.9 % IV SOLN: 250.0000 mL | INTRAVENOUS | Status: DC | PRN

## 2014-11-21 MED ORDER — FENTANYL CITRATE 0.05 MG/ML IJ SOLN: 25.0000 ug | INTRAMUSCULAR | Status: DC | PRN

## 2014-11-21 MED ORDER — PROMETHAZINE HCL 25 MG PO TABS: 25.0000 mg | ORAL_TABLET | Freq: Four times a day (QID) | ORAL | Status: AC | PRN

## 2014-11-21 MED ORDER — LACTATED RINGERS IV SOLN
INTRAVENOUS | Status: DC
  Administered 2014-11-21: 1000 mL via INTRAVENOUS

## 2014-11-21 MED ORDER — NEOSTIGMINE METHYLSULFATE 10 MG/10ML IV SOLN
INTRAVENOUS | Status: AC
  Filled 2014-11-21: qty 1

## 2014-11-21 MED ORDER — FENTANYL CITRATE 0.05 MG/ML IJ SOLN
INTRAMUSCULAR | Status: AC
  Filled 2014-11-21: qty 2

## 2014-11-21 MED ORDER — BUPIVACAINE HCL (PF) 0.5 % IJ SOLN
INTRAMUSCULAR | Status: DC | PRN
  Administered 2014-11-21: 30 mL

## 2014-11-21 MED ORDER — HYDROMORPHONE HCL 1 MG/ML IJ SOLN
0.2500 mg | INTRAMUSCULAR | Status: DC | PRN
  Administered 2014-11-21 (×5): 0.25 mg via INTRAVENOUS

## 2014-11-21 MED ORDER — KETOROLAC TROMETHAMINE 30 MG/ML IJ SOLN
INTRAMUSCULAR | Status: DC | PRN
  Administered 2014-11-21: 30 mg via INTRAVENOUS

## 2014-11-21 MED ORDER — PROPOFOL 10 MG/ML IV BOLUS
INTRAVENOUS | Status: AC
Start: 2014-11-21 — End: 2014-11-21
  Filled 2014-11-21: qty 20

## 2014-11-21 MED ORDER — PROPOFOL 10 MG/ML IV BOLUS
INTRAVENOUS | Status: DC | PRN
  Administered 2014-11-21: 200 mg via INTRAVENOUS

## 2014-11-21 MED ORDER — FENTANYL CITRATE 0.05 MG/ML IJ SOLN
INTRAMUSCULAR | Status: DC | PRN
  Administered 2014-11-21: 50 ug via INTRAVENOUS
  Administered 2014-11-21: 100 ug via INTRAVENOUS
  Administered 2014-11-21 (×2): 50 ug via INTRAVENOUS

## 2014-11-21 MED ORDER — FENTANYL CITRATE 0.05 MG/ML IJ SOLN
25.0000 ug | INTRAMUSCULAR | Status: DC | PRN
  Administered 2014-11-21: 25 ug via INTRAVENOUS
  Administered 2014-11-21: 50 ug via INTRAVENOUS
  Administered 2014-11-21: 25 ug via INTRAVENOUS

## 2014-11-21 MED ORDER — ACETAMINOPHEN 325 MG PO TABS: 650.0000 mg | ORAL_TABLET | ORAL | Status: DC | PRN

## 2014-11-21 MED ORDER — SODIUM CHLORIDE 0.9 % IJ SOLN: 3.0000 mL | Freq: Two times a day (BID) | INTRAMUSCULAR | Status: DC

## 2014-11-21 MED ORDER — ONDANSETRON HCL 4 MG/2ML IJ SOLN
INTRAMUSCULAR | Status: DC | PRN
  Administered 2014-11-21: 4 mg via INTRAVENOUS

## 2014-11-21 MED ORDER — LIDOCAINE HCL (CARDIAC) 20 MG/ML IV SOLN
INTRAVENOUS | Status: DC | PRN
  Administered 2014-11-21: 100 mg via INTRAVENOUS

## 2014-11-21 MED ORDER — GLYCOPYRROLATE 0.2 MG/ML IJ SOLN
INTRAMUSCULAR | Status: AC
  Filled 2014-11-21: qty 3

## 2014-11-21 MED ORDER — SCOPOLAMINE 1 MG/3DAYS TD PT72
MEDICATED_PATCH | TRANSDERMAL | Status: AC
  Filled 2014-11-21: qty 1

## 2014-11-21 MED ORDER — ROCURONIUM BROMIDE 100 MG/10ML IV SOLN
INTRAVENOUS | Status: AC
  Filled 2014-11-21: qty 1

## 2014-11-21 MED ORDER — MIDAZOLAM HCL 5 MG/5ML IJ SOLN
INTRAMUSCULAR | Status: DC | PRN
  Administered 2014-11-21: 2 mg via INTRAVENOUS

## 2014-11-21 MED ORDER — FENTANYL CITRATE 0.05 MG/ML IJ SOLN
INTRAMUSCULAR | Status: AC
Start: 2014-11-21 — End: 2014-11-21
  Filled 2014-11-21: qty 5

## 2014-11-21 MED ORDER — MIDAZOLAM HCL 2 MG/2ML IJ SOLN
INTRAMUSCULAR | Status: AC
  Filled 2014-11-21: qty 2

## 2014-11-21 MED ORDER — 0.9 % SODIUM CHLORIDE (POUR BTL) OPTIME
TOPICAL | Status: DC | PRN
  Administered 2014-11-21: 1000 mL

## 2014-11-21 MED ORDER — DOXYCYCLINE HYCLATE 100 MG PO TABS: 100.0000 mg | ORAL_TABLET | Freq: Two times a day (BID) | ORAL | Status: AC

## 2014-11-21 MED ORDER — ONDANSETRON HCL 4 MG/2ML IJ SOLN
INTRAMUSCULAR | Status: AC
  Filled 2014-11-21: qty 2

## 2014-11-21 MED ORDER — TRAMADOL HCL 50 MG PO TABS: 50.0000 mg | ORAL_TABLET | Freq: Four times a day (QID) | ORAL | Status: AC | PRN

## 2014-11-21 MED ORDER — ACETAMINOPHEN 650 MG RE SUPP: 650.0000 mg | RECTAL | Status: DC | PRN

## 2014-11-21 MED ORDER — SCOPOLAMINE 1 MG/3DAYS TD PT72
MEDICATED_PATCH | TRANSDERMAL | Status: DC | PRN
  Administered 2014-11-21: 1 via TRANSDERMAL

## 2014-11-21 MED ORDER — ACETAMINOPHEN 10 MG/ML IV SOLN
INTRAVENOUS | Status: DC | PRN
  Administered 2014-11-21: 1000 mg via INTRAVENOUS

## 2014-11-21 MED ORDER — HYDROMORPHONE HCL 1 MG/ML IJ SOLN
INTRAMUSCULAR | Status: AC
  Filled 2014-11-21: qty 1

## 2014-11-21 MED ORDER — SODIUM CHLORIDE 0.9 % IJ SOLN: 3.0000 mL | INTRAMUSCULAR | Status: DC | PRN

## 2014-11-21 MED ORDER — HYDROMORPHONE HCL 1 MG/ML IJ SOLN
INTRAMUSCULAR | Status: DC
Start: 2014-11-21 — End: 2014-11-21
  Filled 2014-11-21: qty 1

## 2014-11-21 MED ORDER — BUPIVACAINE-EPINEPHRINE 0.25% -1:200000 IJ SOLN
INTRAMUSCULAR | Status: AC
Start: 2014-11-21 — End: 2014-11-21
  Filled 2014-11-21: qty 1

## 2014-11-21 MED ORDER — TRAMADOL HCL 50 MG PO TABS
50.0000 mg | ORAL_TABLET | Freq: Once | ORAL | Status: AC
  Administered 2014-11-21: 50 mg via ORAL
  Filled 2014-11-21: qty 1

## 2014-11-21 MED ORDER — DEXAMETHASONE SODIUM PHOSPHATE 10 MG/ML IJ SOLN
INTRAMUSCULAR | Status: DC | PRN
  Administered 2014-11-21: 10 mg via INTRAVENOUS

## 2014-11-21 SURGICAL SUPPLY — 28 items
BINDER ABDOMINAL 12 ML 46-62 (SOFTGOODS) IMPLANT
BLADE EXTENDED COATED 6.5IN (ELECTRODE) IMPLANT
BLADE HEX COATED 2.75 (ELECTRODE) ×3 IMPLANT
DECANTER SPIKE VIAL GLASS SM (MISCELLANEOUS) IMPLANT
DRAPE LAPAROSCOPIC ABDOMINAL (DRAPES) ×3 IMPLANT
ELECT REM PT RETURN 9FT ADLT (ELECTROSURGICAL) ×3
ELECTRODE REM PT RTRN 9FT ADLT (ELECTROSURGICAL) ×1 IMPLANT
GAUZE SPONGE 4X4 12PLY STRL (GAUZE/BANDAGES/DRESSINGS) ×3 IMPLANT
GLOVE BIOGEL PI IND STRL 7.0 (GLOVE) ×1 IMPLANT
GLOVE BIOGEL PI INDICATOR 7.0 (GLOVE) ×2
GLOVE SURG SIGNA 7.5 PF LTX (GLOVE) ×3 IMPLANT
GOWN STRL REUS W/TWL LRG LVL3 (GOWN DISPOSABLE) ×3 IMPLANT
GOWN STRL REUS W/TWL XL LVL3 (GOWN DISPOSABLE) ×6 IMPLANT
KIT BASIN OR (CUSTOM PROCEDURE TRAY) ×3 IMPLANT
LIQUID BAND (GAUZE/BANDAGES/DRESSINGS) ×3 IMPLANT
NEEDLE HYPO 22GX1.5 SAFETY (NEEDLE) IMPLANT
NS IRRIG 1000ML POUR BTL (IV SOLUTION) ×3 IMPLANT
PACK GENERAL/GYN (CUSTOM PROCEDURE TRAY) ×3 IMPLANT
STAPLER VISISTAT 35W (STAPLE) IMPLANT
SUT MNCRL AB 4-0 PS2 18 (SUTURE) ×3 IMPLANT
SUT NOVA NAB DX-16 0-1 5-0 T12 (SUTURE) ×3 IMPLANT
SUT VIC AB 3-0 SH 27 (SUTURE) ×2
SUT VIC AB 3-0 SH 27X BRD (SUTURE) ×1 IMPLANT
SUT VICRYL 2 0 18  UND BR (SUTURE)
SUT VICRYL 2 0 18 UND BR (SUTURE) IMPLANT
SYR CONTROL 10ML LL (SYRINGE) IMPLANT
TOWEL OR 17X26 10 PK STRL BLUE (TOWEL DISPOSABLE) ×3 IMPLANT
TRAY FOLEY CATH 14FRSI W/METER (CATHETERS) IMPLANT

## 2014-11-21 NOTE — H&P (Signed)
Dana BarcelonaChristine Meyer 11/19/2014 1:30 PM Location: [facility] Patient #: [id number] DOB: 1981-09-27 Married / Language: English / Race: White Female  History of Present Illness (My Rinke A. Magnus IvanBlackman MD; 11/19/2014 1:48 PM) Patient words: Intial for umbilical hernia.  The patient is a 34 year old female who presents with an umbilical hernia. This patient is referred by the emergency department for incarcerated umbilical hernia. She has had a granuloma at her umbilicus after previous surgery for several years. She started having abdominal discomfort little over a week ago. She denies trauma to the area. She has had nausea secondary to her pain. She describes the pain is sharp and constant. It is focal to the umbilicus. She is otherwise without complaints.   Other Problems Jake Church(Jason McDowell, LPN; 0/45/40981/13/2016 1:191:30 PM) Heart murmur Migraine Headache  Past Surgical History Jake Church(Jason McDowell, LPN; 1/47/82951/13/2016 6:211:30 PM) Foot Surgery Right. Gallbladder Surgery - Laparoscopic Hysterectomy (not due to cancer) - Partial  Diagnostic Studies History Jake Church(Jason McDowell, LPN; 3/08/65781/13/2016 4:691:30 PM) Colonoscopy never Mammogram never Pap Smear 1-5 years ago  Allergies Jake Church(Jason McDowell, LPN; 6/29/52841/13/2016 1:321:32 PM) Latex OxyCODONE HCl ER *ANALGESICS - OPIOID*  Medication History Jake Church(Jason McDowell, LPN; 4/40/10271/13/2016 2:531:34 PM) EpiPen 2-Pak (0.3MG /0.3ML Soln Auto-inj, Injection) Active. Restasis (0.05% Emulsion, Ophthalmic) Active. Welchol (625MG  Tablet, Oral) Active. Keflex (250MG  Capsule, Oral) Active. Flonase Allergy Relief (50MCG/ACT Suspension, Nasal) Active. Norco (5-325MG  Tablet, Oral) Active. Zofran ODT (4MG  Tablet Disperse, Oral) Active. Maxalt (10MG  Tablet, Oral) Active.  Social History Jake Church(Jason McDowell, LPN; 6/64/40341/13/2016 7:421:30 PM) Alcohol use Occasional alcohol use. Caffeine use Carbonated beverages, Tea. No drug use Tobacco use Never smoker.  Family History Jake Church(Jason McDowell,  LPN; 5/95/63871/13/2016 5:641:30 PM) Family history unknown First Degree Relatives  Pregnancy / Birth History Jake Church(Jason McDowell, LPN; 3/32/95181/13/2016 8:411:30 PM) Age at menarche 8 years. Contraceptive History Oral contraceptives. Gravida 1 Irregular periods Maternal age 36-30 Para 0  Review of Systems Jake Church(Jason McDowell LPN; 6/60/63011/13/2016 6:011:30 PM) General Present- Appetite Loss. Not Present- Chills, Fatigue, Fever, Night Sweats, Weight Gain and Weight Loss. Skin Present- Dryness. Not Present- Change in Wart/Mole, Hives, Jaundice, New Lesions, Non-Healing Wounds, Rash and Ulcer. HEENT Present- Wears glasses/contact lenses. Not Present- Earache, Hearing Loss, Hoarseness, Nose Bleed, Oral Ulcers, Ringing in the Ears, Seasonal Allergies, Sinus Pain, Sore Throat, Visual Disturbances and Yellow Eyes. Respiratory Not Present- Bloody sputum, Chronic Cough, Difficulty Breathing, Snoring and Wheezing. Breast Not Present- Breast Mass, Breast Pain, Nipple Discharge and Skin Changes. Cardiovascular Not Present- Chest Pain, Difficulty Breathing Lying Down, Leg Cramps, Palpitations, Rapid Heart Rate, Shortness of Breath and Swelling of Extremities. Gastrointestinal Present- Abdominal Pain, Indigestion, Nausea and Vomiting. Not Present- Bloating, Bloody Stool, Change in Bowel Habits, Chronic diarrhea, Constipation, Difficulty Swallowing, Excessive gas, Gets full quickly at meals, Hemorrhoids and Rectal Pain. Female Genitourinary Not Present- Frequency, Nocturia, Painful Urination, Pelvic Pain and Urgency. Musculoskeletal Not Present- Back Pain, Joint Pain, Joint Stiffness, Muscle Pain, Muscle Weakness and Swelling of Extremities. Neurological Present- Headaches. Not Present- Decreased Memory, Fainting, Numbness, Seizures, Tingling, Tremor, Trouble walking and Weakness. Psychiatric Not Present- Anxiety, Bipolar, Change in Sleep Pattern, Depression, Fearful and Frequent crying. Endocrine Not Present- Cold Intolerance, Excessive  Hunger, Hair Changes, Heat Intolerance, Hot flashes and New Diabetes. Hematology Not Present- Easy Bruising, Excessive bleeding, Gland problems, HIV and Persistent Infections.   Vitals Jake Church(Jason McDowell LPN; 0/93/23551/13/2016 7:321:34 PM) 11/19/2014 1:34 PM Weight: 282.25 lb Height: 68in Body Surface Area: 2.48 m Body Mass Index: 42.92 kg/m Temp.: 97.55F(Temporal)  Pulse: 64 (Regular)  Resp.: 20 (Unlabored)  BP: 160/110 (Sitting, Left Arm, Standard)    Physical Exam (Rajiv Parlato A. Magnus Ivan MD; 11/19/2014 1:54 PM) General Mental Status-Alert. General Appearance-Consistent with stated age. Hydration-Well hydrated. Voice-Normal.  Head and Neck Head-normocephalic, atraumatic with no lesions or palpable masses.  Eye Eyeball - Bilateral-Extraocular movements intact. Sclera/Conjunctiva - Bilateral-No scleral icterus.  Chest and Lung Exam Chest and lung exam reveals -quiet, even and easy respiratory effort with no use of accessory muscles and on auscultation, normal breath sounds, no adventitious sounds and normal vocal resonance. Inspection Chest Wall - Normal. Back - normal.  Cardiovascular Cardiovascular examination reveals -on palpation PMI is normal in location and amplitude, no palpable S3 or S4. Normal cardiac borders., normal heart sounds, regular rate and rhythm with no murmurs, carotid auscultation reveals no bruits and normal pedal pulses bilaterally.  Abdomen Inspection Inspection of the abdomen reveals - No Hernias. Skin - Scar - Note: There is a moderate sized granuloma at the umbilicus. Because of the tenderness I cannot palpate the defect underneath. Palpation/Percussion Palpation and Percussion of the abdomen reveal - Soft, Non Tender, No Rebound tenderness, No Rigidity (guarding) and No hepatosplenomegaly. Auscultation Auscultation of the abdomen reveals - Bowel sounds normal.  Neurologic Neurologic evaluation reveals -alert and oriented x 3  with no impairment of recent or remote memory. Mental Status-Normal.  Musculoskeletal Normal Exam - Left-Upper Extremity Strength Normal and Lower Extremity Strength Normal. Normal Exam - Right-Upper Extremity Strength Normal, Lower Extremity Weakness.    Assessment & Plan (Danil Wedge A. Magnus Ivan MD; 11/19/2014 1:56 PM) Dorethea Clan UMBILICAL HERNIA (552.1  K42.0) Impression: Repair of incarcerated umbilical hernia with mesh is recommended as well as removal of the granuloma. Again, I am uncertain whether the granuloma or the hernia is causing discomfort. The CAT scan shows only a small hernia with omentum. There is no bowel involved in the hernia. I discussed the risks of surgery which includes but is not limited to bleeding, infection, recurrence, need for further surgery, postoperative recovery, etc. She understands and wishes to proceed. Surgery is scheduled urgently     Signed by Shelly Rubenstein, MD (11/19/2014 1:56 PM)

## 2014-11-21 NOTE — Anesthesia Postprocedure Evaluation (Signed)
  Anesthesia Post-op Note  Patient: Dana Meyer  Procedure(s) Performed: Procedure(s): UMBILICAL HERNIA REPAIR WITH MESH (N/A) INSERTION OF MESH (N/A)  Patient Location: PACU  Anesthesia Type:General  Level of Consciousness: awake and alert   Airway and Oxygen Therapy: Patient Spontanous Breathing  Post-op Pain: none  Post-op Assessment: Post-op Vital signs reviewed  Post-op Vital Signs: Reviewed  Last Vitals:  Filed Vitals:   11/21/14 1623  BP: 145/84  Pulse: 83  Temp: 37.1 C  Resp: 15    Complications: No apparent anesthesia complications

## 2014-11-21 NOTE — Transfer of Care (Signed)
Immediate Anesthesia Transfer of Care Note  Patient: Dana Meyer  Procedure(s) Performed: Procedure(s): UMBILICAL HERNIA REPAIR WITH MESH (N/A) INSERTION OF MESH (N/A)  Patient Location: PACU  Anesthesia Type:General  Level of Consciousness: awake, alert , oriented and patient cooperative  Airway & Oxygen Therapy: Patient Spontanous Breathing and Patient connected to face mask oxygen  Post-op Assessment: Report given to PACU RN, Post -op Vital signs reviewed and stable and Patient moving all extremities X 4  Post vital signs: stable  Complications: No apparent anesthesia complications

## 2014-11-21 NOTE — Discharge Instructions (Signed)
[facility] _______[facility]  UMBILICAL OR INGUINAL HERNIA REPAIR: POST OP INSTRUCTIONS  Always review your discharge instruction sheet given to you by the facility where your surgery was performed. IF YOU HAVE DISABILITY OR FAMILY LEAVE FORMS, YOU MUST BRING THEM TO THE OFFICE FOR PROCESSING.   DO NOT GIVE THEM TO YOUR DOCTOR.  1. A  prescription for pain medication may be given to you upon discharge.  Take your pain medication as prescribed, if needed.  If narcotic pain medicine is not needed, then you may take acetaminophen (Tylenol) or ibuprofen (Advil) as needed. 2. Take your usually prescribed medications unless otherwise directed. 3. If you need a refill on your pain medication, please contact your pharmacy.  They will contact our office to request authorization. Prescriptions will not be filled after 5 pm or on week-ends. 4. You should follow a light diet the first 24 hours after arrival home, such as soup and crackers, etc.  Be sure to include lots of fluids daily.  Resume your normal diet the day after surgery. 5. Most patients will experience some swelling and bruising around the umbilicus or in the groin and scrotum.  Ice packs and reclining will help.  Swelling and bruising can take several days to resolve.  6. It is common to experience some constipation if taking pain medication after surgery.  Increasing fluid intake and taking a stool softener (such as Colace) will usually help or prevent this problem from occurring.  A mild laxative (Milk of Magnesia or Miralax) should be taken according to package directions if there are no bowel movements after 48 hours. 7. Unless discharge instructions indicate otherwise, you may remove your bandages 24-48 hours after surgery, and you may shower at that time.  You may have steri-strips (small skin tapes) in place directly over the incision.  These strips should be left on the skin for 7-10 days.  If your surgeon used skin glue on the  incision, you may shower in 24 hours.  The glue will flake off over the next 2-3 weeks.  Any sutures or staples will be removed at the office during your follow-up visit. 8. ACTIVITIES:  You may resume regular (light) daily activities beginning the next day--such as daily self-care, walking, climbing stairs--gradually increasing activities as tolerated.  You may have sexual intercourse when it is comfortable.  Refrain from any heavy lifting or straining until approved by your doctor. a. You may drive when you are no longer taking prescription pain medication, you can comfortably wear a seatbelt, and you can safely maneuver your car and apply brakes. b. RETURN TO WORK:  __________________________________________________________ 9. You should see your doctor in the office for a follow-up appointment approximately 2-3 weeks after your surgery.  Make sure that you call for this appointment within a day or two after you arrive home to insure a convenient appointment time. 10. OTHER INSTRUCTIONS:  _____________________________ice pack and ibuprofen also for pain 11. No lifting more than 15 pounds for 4 weeks. 12. ___________________________________________________________________________________________________________________________________________________________  WHEN TO CALL YOUR DOCTOR: 1. Fever over 101.0 2. Inability to urinate 3. Nausea and/or vomiting 4. Extreme swelling or bruising 5. Continued bleeding from incision. 6. Increased pain, redness, or drainage from the incision  The clinic staff is available to answer your questions during regular business hours.  Please dont hesitate to call and ask to speak to one of the nurses for clinical concerns.  If you have a medical emergency, go to the nearest emergency room or  call 911.  A surgeon from [facility] is always on call at the hospital ° ° °[address], [address], [place], [place]  [address] ? ° [address] [address],  [place], [place]   [address] °[phone] ? [phone] ? FAX [phone] °Web site: [email or web address] °

## 2014-11-21 NOTE — Op Note (Signed)
UMBILICAL HERNIA REPAIR WITH MESH, INSERTION OF MESH  Procedure Note  Dana Meyer 11/21/2014   Pre-op Diagnosis: Umbilical Hernia, scar granuloma     Post-op Diagnosis: same  Procedure(s): UMBILICAL HERNIA REPAIR EXCISION OF SCAR GRANULOMA  Surgeon(s): Abigail Miyamotoouglas Kasara Schomer, MD  Anesthesia: General  Staff:  Circulator: Gerda DissLynn S Barker, RN Relief Circulator: Alphonzo LemmingsLula Kimball Osborn, RN Scrub Person: Audrie GallusMisty M Tuttle, CST; Tiki W Burris  Estimated Blood Loss: Minimal               Specimens: sent to path          [facility]villeBLACKMAN,Dana Binning A   Date: 11/21/2014  Time: 2:43 PM

## 2014-11-21 NOTE — Anesthesia Preprocedure Evaluation (Addendum)
Anesthesia Evaluation  Patient identified by MRN, date of birth, ID band Patient awake    Reviewed: Allergy & Precautions, NPO status , Patient's Chart, lab work & pertinent test results  Airway        Dental   Pulmonary neg pulmonary ROS,          Cardiovascular + dysrhythmias (No current meds)     Neuro/Psych  Headaches, negative psych ROS   GI/Hepatic negative GI ROS, Neg liver ROS,   Endo/Other  Morbid obesity  Renal/GU negative Renal ROS     Musculoskeletal negative musculoskeletal ROS (+)   Abdominal   Peds  Hematology negative hematology ROS (+)   Anesthesia Other Findings   Reproductive/Obstetrics                            Anesthesia Physical Anesthesia Plan  ASA: III  Anesthesia Plan: General   Post-op Pain Management:    Induction: Intravenous  Airway Management Planned: Oral ETT  Additional Equipment:   Intra-op Plan:   Post-operative Plan: Extubation in OR  Informed Consent: I have reviewed the patients History and Physical, chart, labs and discussed the procedure including the risks, benefits and alternatives for the proposed anesthesia with the patient or authorized representative who has indicated his/her understanding and acceptance.   Dental advisory given  Plan Discussed with: CRNA  Anesthesia Plan Comments:         Anesthesia Quick Evaluation

## 2014-11-21 NOTE — Interval H&P Note (Signed)
History and Physical Interval Note:no change in h and p  11/21/2014 1:04 PM  [name]  has presented today for surgery, with the diagnosis of Umbilical Hernia  The various methods of treatment have been discussed with the patient and family. After consideration of risks, benefits and other options for treatment, the patient has consented to  Procedure(s): UMBILICAL HERNIA REPAIR WITH MESH (N/A) INSERTION OF MESH (N/A) as a surgical intervention .  The patient's history has been reviewed, patient examined, no change in status, stable for surgery.  I have reviewed the patient's chart and labs.  Questions were answered to the patient's satisfaction.     [name]

## 2014-11-22 NOTE — Op Note (Signed)
NAMAntonietta Barcelona:  Plagge, Kiran           ACCOUNT NO.:  [id number]  MEDICAL RECORD NO.:  [id number]  LOCATION:  [facility]                         [facility]  PHYSICIAN:  Abigail Miyamotoouglas Whitaker Holderman, M.D. DATE OF BIRTH:  1981-10-10  DATE OF PROCEDURE:  11/21/2014 DATE OF DISCHARGE:  11/21/2014                              OPERATIVE REPORT   PREOPERATIVE DIAGNOSES: 1. Umbilical hernia. 2. Scar granuloma.  POSTOPERATIVE DIAGNOSES: 1. Umbilical hernia. 2. Scar granuloma.  PROCEDURE: 1. Umbilical hernia repair. 2. Excision of scar granuloma.  SURGEON:  Abigail Miyamotoouglas Delania Ferg, MD  ANESTHESIA:  General and 0.25% Marcaine.  ESTIMATED BLOOD LOSS:  Minimal.  INDICATIONS:  This is a 34 year old female who presented with acute umbilical pain.  She had a granuloma at a previous scar in her umbilicus.  She had a CAT scan showing some stranding in the abdominal wall and an umbilical hernia containing probable omentum.  Because of her discomfort, decision was made to proceed to the operating room.  FINDINGS:  The patient was found to have a granuloma at the scar of the umbilicus which was quite impressive.  There was a moderate amount of edema in the abdominal wall.  The actual hernia defect itself was less than a centimeter in size.  There was no incarcerated hernia in the omentum.  I excised the granuloma as well as the hernia sac and sent it to Pathology for evaluation.  I repaired the hernia primarily.  PROCEDURE IN DETAIL:  The patient was brought to the operating room and identified as Dana Meyer.  She was placed supine on the operating table and general anesthesia was induced.  Her abdomen was then prepped and draped in usual sterile fashion.  I anesthetized the skin around this large granuloma with Marcaine.  I then made an elliptical incision incorporating the old scar at the lateral edge of the umbilicus.  I then excised the granuloma in its entirety with the electrocautery.  After  I removed this, the patient had a lot of subcutaneous fat between the skin and the fascia.  There was a hernia defect.  There was a lot of edema in the surrounding tissue and some fat necrosis.  I excised the hernia sac and the fat necrosis and what appeared to be inflamed tissue and sent this to pathology as well as the granuloma.  The actual hernia defect had no incarcerated omentum in the defect.  Because of the edema and inflammation in the abdominal wall, I was afraid to place any mesh for fear of creating an infection.  I therefore repaired the small fascial defect primarily with 2 separate #1 Novafil figure-of-eight sutures.  I then irrigated the wound with saline.  I anesthetized it further with Marcaine.  I then closed the subcutaneous tissue with interrupted 3- 0 Vicryl sutures and closed the skin with a running 4-0 Monocryl. Dermabond was then applied.  The patient tolerated the procedure well. All the counts were correct at the end of procedure.  The patient was then extubated in the operating room, and taken in stable condition to the recovery room.     Abigail Miyamotoouglas [name], M.D.     DB/MEDQ  D:  11/21/2014  T:  11/22/2014  Job:  [id number] 

## 2014-11-24 ENCOUNTER — Encounter (HOSPITAL_COMMUNITY): Payer: Self-pay | Admitting: Surgery

## 2015-01-09 NOTE — Addendum Note (Signed)
Addendum  created 01/09/15 1507 by Kennieth Radobert E Jonasia Coiner, MD   Modules edited: Anesthesia Responsible Staff

## 2015-08-03 ENCOUNTER — Other Ambulatory Visit: Payer: Self-pay | Admitting: Surgery

## 2015-08-03 DIAGNOSIS — R1033 Periumbilical pain: Secondary | ICD-10-CM

## 2015-08-03 DIAGNOSIS — Z8719 Personal history of other diseases of the digestive system: Secondary | ICD-10-CM

## 2015-08-03 DIAGNOSIS — L02216 Cutaneous abscess of umbilicus: Secondary | ICD-10-CM

## 2015-08-10 ENCOUNTER — Ambulatory Visit
Admission: RE | Admit: 2015-08-10 | Discharge: 2015-08-10 | Disposition: A | Discharge status: Home / Self Care | Payer: 59 | Source: Ambulatory Visit | Attending: Surgery | Admitting: Surgery

## 2015-08-10 MED ORDER — IOPAMIDOL (ISOVUE-300) INJECTION 61%
125.0000 mL | Freq: Once | INTRAVENOUS | Status: AC | PRN
  Administered 2015-08-10: 125 mL via INTRAVENOUS

## 2015-12-09 IMAGING — CT CT ABD-PELV W/ CM
3 of 4 series · 12 of 36 positions shown, 18 images · IV contrast (READICAT/WATER & [ID] ISOVUE 300)
Comparison: Abdominal pelvic CT 11/15/2014.

CLINICAL DATA: Umbilical pain for 4 weeks. History of umbilical
hernia repair 10 months ago. Nausea, vomiting, diarrhea and
constipation. History of cholecystectomy and partial hysterectomy.
Initial encounter.

EXAM:
CT ABDOMEN AND PELVIS WITH CONTRAST
TECHNIQUE: Multidetector CT imaging of the abdomen and pelvis was performed
using the standard protocol following bolus administration of
intravenous contrast.
CONTRAST:  125mL 9T9Y02-SHH IOPAMIDOL (9T9Y02-SHH) INJECTION 61%

[Series 3: abd/pelvis with · axial · 0.98mm/px · z∈[-400,-15]mm · 8 of 100 slices shown, 13 images]
[im 12/100  soft-tissue]
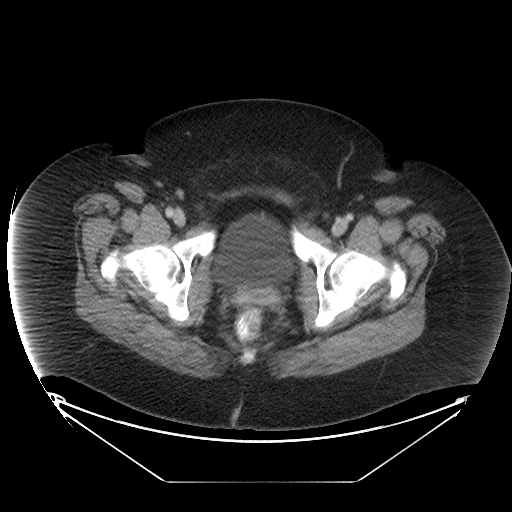
[im 12/100  bone]
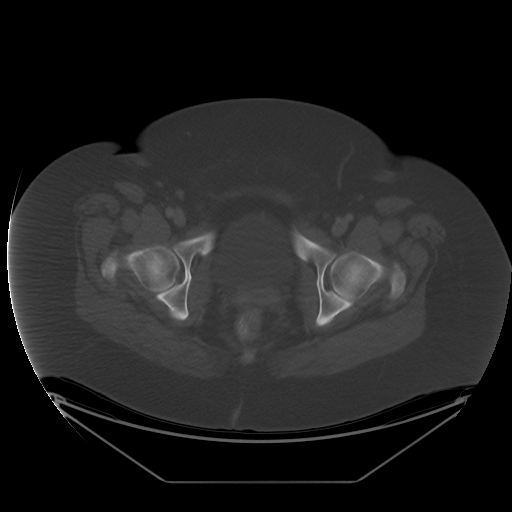
[im 23/100  soft-tissue]
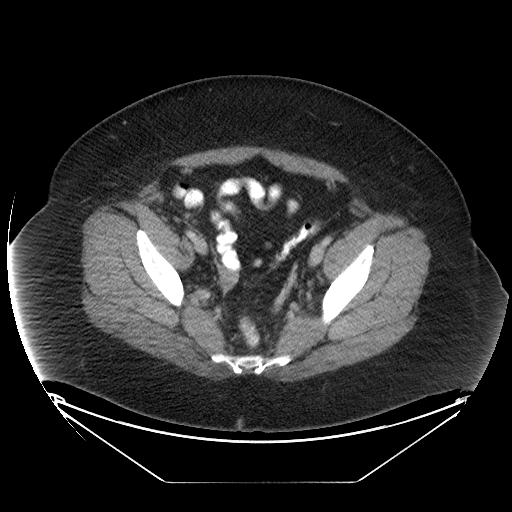
[im 34/100  soft-tissue]
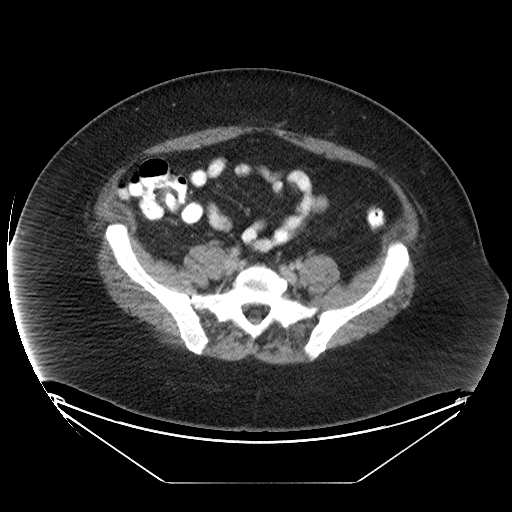
[im 45/100  soft-tissue]
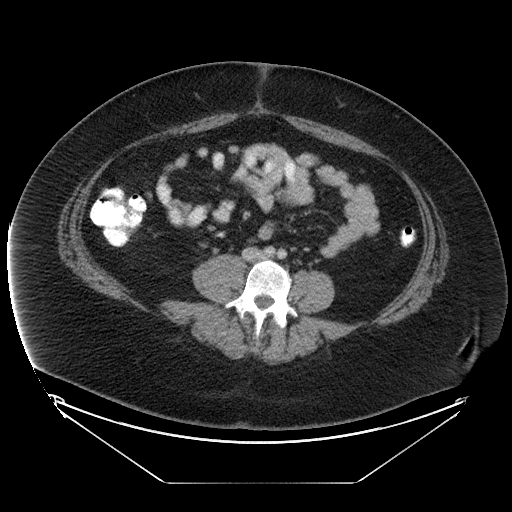
[im 56/100  soft-tissue]
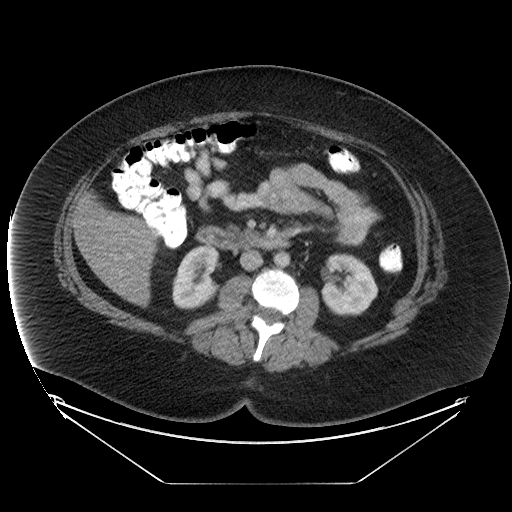
[im 56/100  lung]
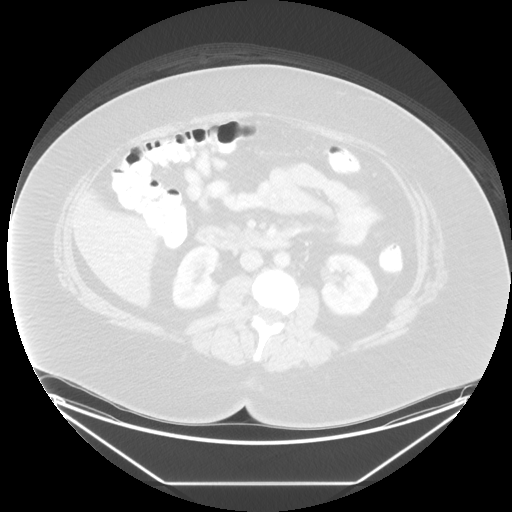
[im 67/100  soft-tissue]
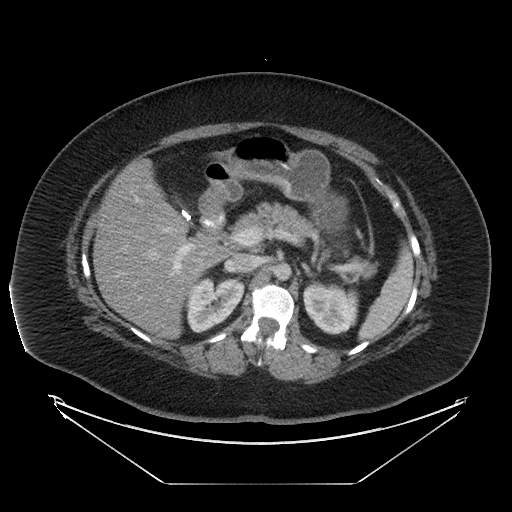
[im 67/100  lung]
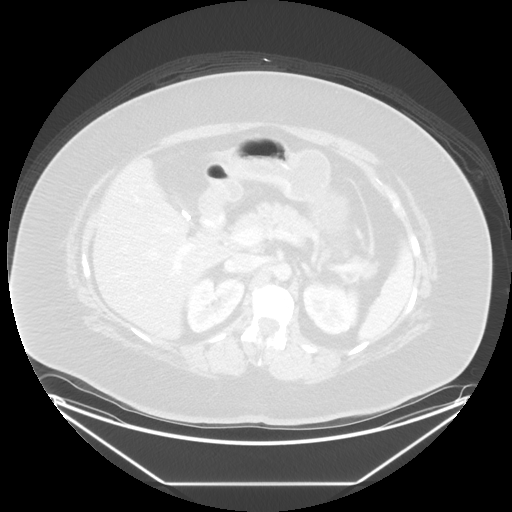
[im 78/100  soft-tissue]
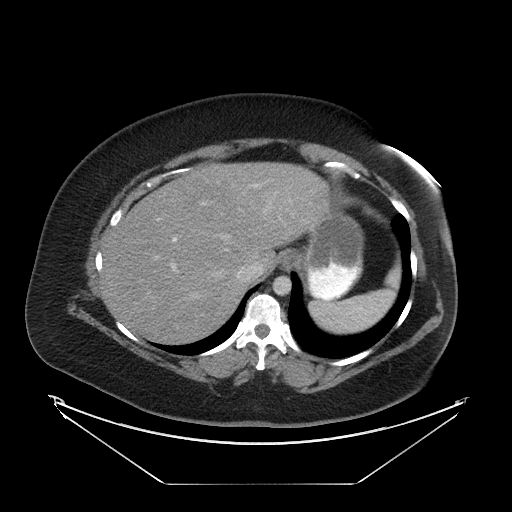
[im 78/100  lung]
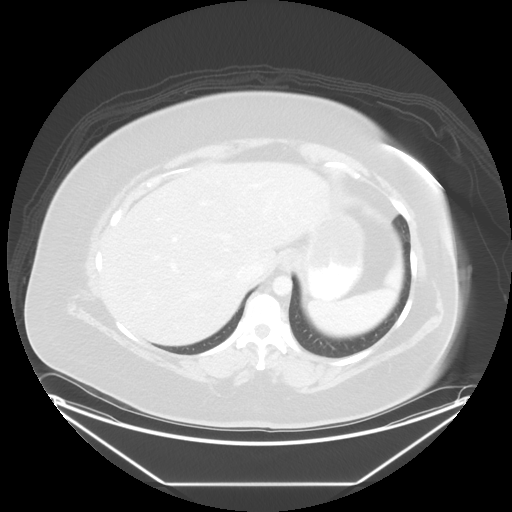
[im 89/100  soft-tissue]
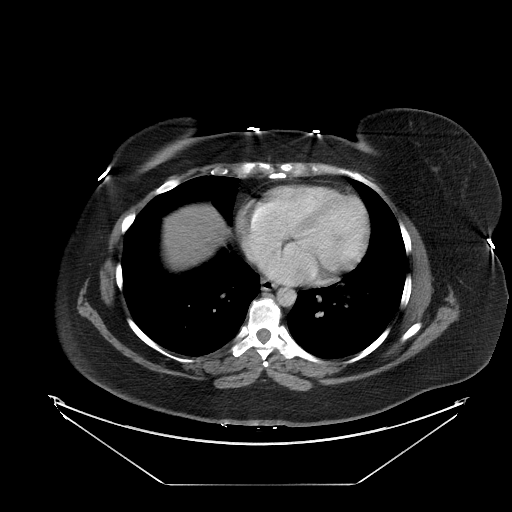
[im 89/100  lung]
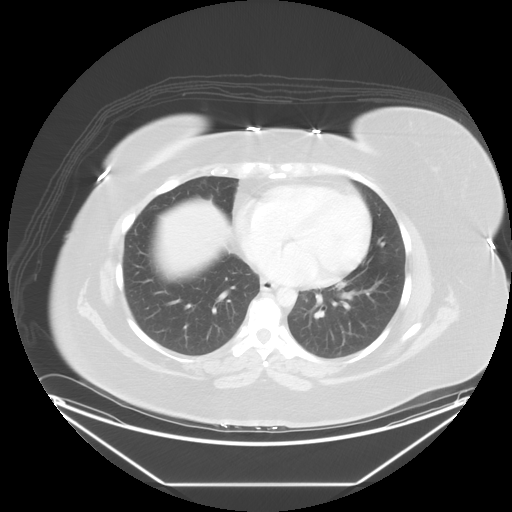

[Series 601: coronal body · coronal · 0.98mm/px · 1 of 148 slices shown, 2 images]
[im 50/148  soft-tissue]
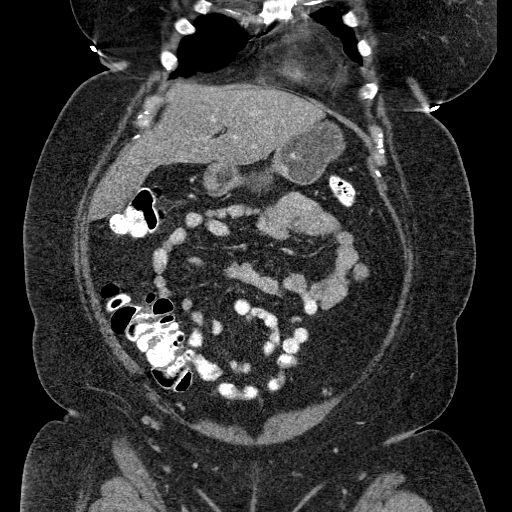
[im 50/148  bone]
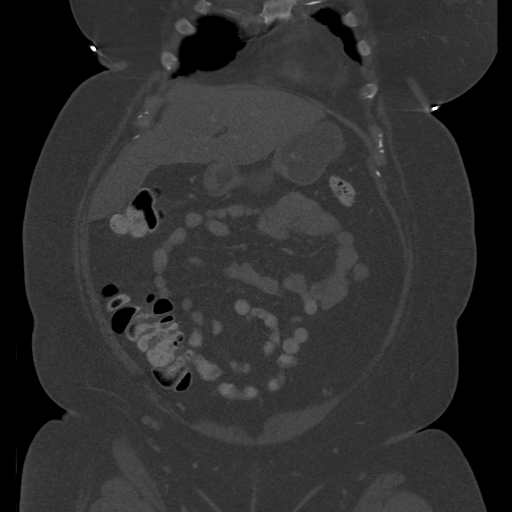

[Series 602: sagittal body · sagittal · 0.98mm/px · 3 of 198 slices shown]
[im 22/198  soft-tissue]
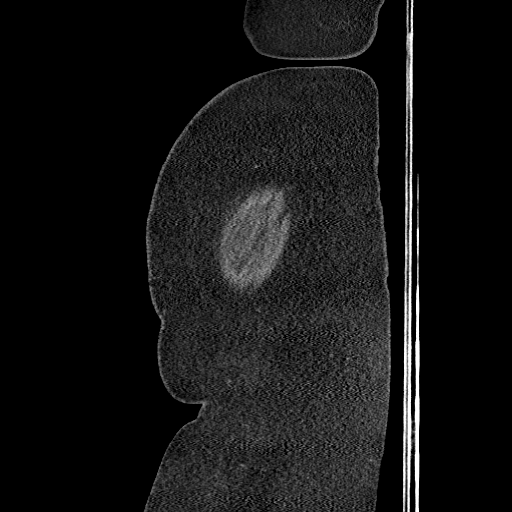
[im 44/198  soft-tissue]
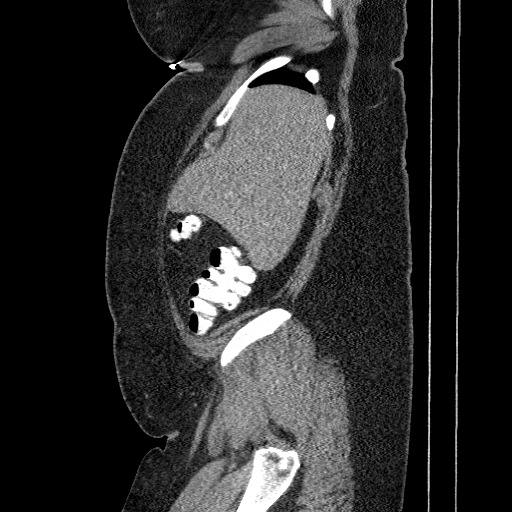
[im 66/198  soft-tissue]
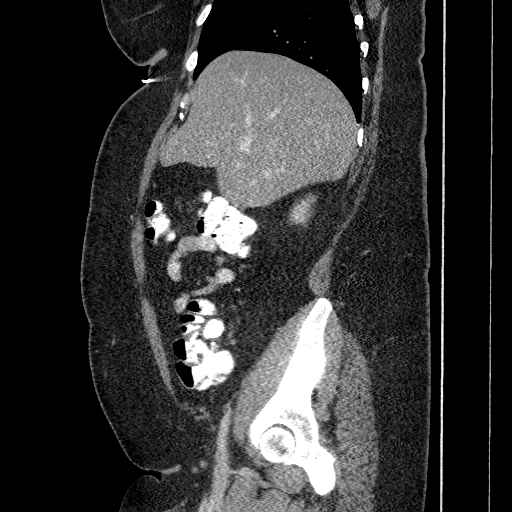

[12 of 36 positions shown; findings below may reference images not displayed]

FINDINGS: Lower chest: Clear lung bases. No significant pleural or pericardial
effusion.

Hepatobiliary: The liver remains enlarged, measuring 24 cm in
height. It demonstrates diffusely decreased density consistent with
steatosis. A relatively hyperdense 3.7 x 2.6 cm lesion in the dome
of the right hepatic lobe on image 15 is unchanged. There is similar
increased density adjacent to the gallbladder fossa. No new or
enlarging lesions identified. There is no biliary dilatation status
post cholecystectomy.

Pancreas: Unremarkable. No pancreatic ductal dilatation or
surrounding inflammatory changes.

Spleen: Normal in size without focal abnormality.

Adrenals/Urinary Tract: Both adrenal glands appear normal. The
kidneys appear normal without evidence of urinary tract calculus,
suspicious lesion or hydronephrosis. No bladder abnormalities are
seen.

Stomach/Bowel: No evidence of bowel wall thickening, distention or
surrounding inflammatory change. The appendix appears to be present
and appears unremarkable.

Vascular/Lymphatic: There are no enlarged abdominal or pelvic lymph
nodes. Mildly prominent lymph nodes in the ileocolonic mesentery are
similar to the prior study, likely reactive. No significant vascular
findings.

Reproductive: Hysterectomy. There is a probable 2.5 cm left ovarian
follicle on image 82. The right ovary appears normal. No pelvic
inflammatory changes demonstrated.

Other: Interval repair of umbilical hernia. No residual hernia,
abnormal fluid collection or surrounding inflammation identified.

Musculoskeletal: No acute osseous findings. There is a stable convex
left lumbar scoliosis. The sacroiliac joints demonstrate stable
subchondral sclerosis bilaterally.
IMPRESSION: 1. No acute findings or explanation for the patient's symptoms.
2. Postsurgical changes from umbilical hernia repair. No residual
hernia or postoperative complication identified.
3. Stable appearance of the liver since prior study of 9 months ago
with findings most consistent with steatosis and focal sparing
around the gallbladder fossa and in the dome of the right lobe. The
liver remains enlarged. The liver could be more definitively
evaluated with MRI if clinically warranted.

## 2017-09-15 ENCOUNTER — Other Ambulatory Visit (INDEPENDENT_AMBULATORY_CARE_PROVIDER_SITE_OTHER): Payer: Self-pay | Admitting: Physician Assistant

## 2018-02-02 DIAGNOSIS — K811 Chronic cholecystitis: Secondary | ICD-10-CM | POA: Insufficient documentation

## 2018-04-07 HISTORY — PX: FINGER GANGLION CYST EXCISION: SHX1636

## 2018-04-22 ENCOUNTER — Emergency Department
Admission: EM | Admit: 2018-04-22 | Discharge: 2018-04-22 | Disposition: A | Discharge status: Home / Self Care | Payer: BLUE CROSS/BLUE SHIELD | Attending: Emergency Medicine | Admitting: Emergency Medicine

## 2018-04-22 ENCOUNTER — Emergency Department: Payer: BLUE CROSS/BLUE SHIELD

## 2018-04-22 DIAGNOSIS — J029 Acute pharyngitis, unspecified: Secondary | ICD-10-CM

## 2018-04-22 DIAGNOSIS — J028 Acute pharyngitis due to other specified organisms: Secondary | ICD-10-CM | POA: Insufficient documentation

## 2018-04-22 LAB — CBC AND DIFFERENTIAL
Absolute NRBC: 0 10*3/uL (ref 0.00–0.00)
Basophils Absolute Automated: 0.03 10*3/uL (ref 0.00–0.08)
Basophils Automated: 0.3 %
Eosinophils Absolute Automated: 0.13 10*3/uL (ref 0.00–0.44)
Eosinophils Automated: 1.4 %
Hematocrit: 46 % — ABNORMAL HIGH (ref 34.7–43.7)
Hgb: 14.9 g/dL — ABNORMAL HIGH (ref 11.4–14.8)
Immature Granulocytes Absolute: 0.04 10*3/uL (ref 0.00–0.07)
Immature Granulocytes: 0.4 %
Lymphocytes Absolute Automated: 3.09 10*3/uL (ref 0.42–3.22)
Lymphocytes Automated: 32.2 %
MCH: 29.9 pg (ref 25.1–33.5)
MCHC: 32.4 g/dL (ref 31.5–35.8)
MCV: 92.4 fL (ref 78.0–96.0)
MPV: 8.5 fL — ABNORMAL LOW (ref 8.9–12.5)
Monocytes Absolute Automated: 0.5 10*3/uL (ref 0.21–0.85)
Monocytes: 5.2 %
Neutrophils Absolute: 5.82 10*3/uL (ref 1.10–6.33)
Neutrophils: 60.5 %
Nucleated RBC: 0 /100 WBC (ref 0.0–0.0)
Platelets: 386 10*3/uL — ABNORMAL HIGH (ref 142–346)
RBC: 4.98 10*6/uL (ref 3.90–5.10)
RDW: 13 % (ref 11–15)
WBC: 9.61 10*3/uL — ABNORMAL HIGH (ref 3.10–9.50)

## 2018-04-22 LAB — PT AND APTT
PT INR: 0.9 (ref 0.9–1.1)
PT: 12.1 s — ABNORMAL LOW (ref 12.6–15.0)
PTT: 31 s (ref 23–37)

## 2018-04-22 LAB — BASIC METABOLIC PANEL
BUN: 9 mg/dL (ref 7.0–19.0)
CO2: 20 mEq/L — ABNORMAL LOW (ref 22–29)
Calcium: 10.2 mg/dL (ref 8.5–10.5)
Chloride: 105 mEq/L (ref 100–111)
Creatinine: 0.8 mg/dL (ref 0.6–1.0)
Glucose: 84 mg/dL (ref 70–100)
Potassium: 4.4 mEq/L (ref 3.5–5.1)
Sodium: 136 mEq/L (ref 136–145)

## 2018-04-22 LAB — POCT PREGNANCY TEST, URINE HCG: POCT Pregnancy HCG Test, UR: NEGATIVE

## 2018-04-22 LAB — GFR: EGFR: >60

## 2018-04-22 MED ORDER — CLINDAMYCIN HCL 300 MG PO CAPS
300.00 mg | ORAL_CAPSULE | Freq: Four times a day (QID) | ORAL | 0 refills | Status: AC
Start: 2018-04-22 — End: 2018-04-29

## 2018-04-22 MED ORDER — ONDANSETRON HCL 4 MG/2ML IJ SOLN
4.00 mg | Freq: Once | INTRAMUSCULAR | Status: AC
Start: 2018-04-22 — End: 2018-04-22
  Administered 2018-04-22: 4 mg via INTRAVENOUS
  Filled 2018-04-22: qty 2

## 2018-04-22 MED ORDER — MORPHINE SULFATE 2 MG/ML IJ/IV SOLN (WRAP)
4.0000 mg | Freq: Once | Status: DC
Start: 2018-04-22 — End: 2018-04-22
  Filled 2018-04-22: qty 2

## 2018-04-22 MED ORDER — DEXAMETHASONE SODIUM PHOSPHATE 4 MG/ML IJ SOLN (WRAP)
8.00 mg | Freq: Once | INTRAMUSCULAR | Status: AC
Start: 2018-04-22 — End: 2018-04-22
  Administered 2018-04-22: 8 mg via INTRAVENOUS
  Filled 2018-04-22: qty 2

## 2018-04-22 MED ORDER — KETOROLAC TROMETHAMINE 30 MG/ML IJ SOLN
30.00 mg | Freq: Once | INTRAMUSCULAR | Status: AC
Start: 2018-04-22 — End: 2018-04-22
  Administered 2018-04-22: 30 mg via INTRAVENOUS
  Filled 2018-04-22: qty 1

## 2018-04-22 MED ORDER — SODIUM CHLORIDE 0.9 % IV BOLUS
1000.00 mL | Freq: Once | INTRAVENOUS | Status: AC
Start: 2018-04-22 — End: 2018-04-22
  Administered 2018-04-22: 1000 mL via INTRAVENOUS

## 2018-04-22 MED ORDER — IOHEXOL 350 MG/ML IV SOLN
100.00 mL | Freq: Once | INTRAVENOUS | Status: AC | PRN
Start: 2018-04-22 — End: 2018-04-22
  Administered 2018-04-22: 90 mL via INTRAVENOUS

## 2018-04-22 NOTE — ED Notes (Signed)
This patient was seen by me in triage and initial testing was ordered based on presenting complaint. Care was expedited. I am not the primary provider for this patient.   37 y.o. initially on Keflex for finger infection then doxy here with sore throat.  Strep and mono X 2  erythema but no PTA noted     Joice Lofts, MD  04/22/18 1424

## 2018-04-22 NOTE — ED Provider Notes (Signed)
I was not involved in the care of this patient.     Dr. Aggie Moats MD     Maceo Pro, MD  04/22/18 [phone]

## 2018-04-22 NOTE — ED Provider Notes (Signed)
Attending Note:     The patient was seen and examined by the mid-level (physician's assistant or nurse practitioner), or fellow, and the plan of care was discussed with me. I agree with the plan as it was presented to me.     Hx: 37 y.o. female p/w throat pain after a recent ganglion cyst removal surgery.    Patient had surgery on 04/12/18, and the surgical center started her on keflex for 5 days.  Antibiotic course ended on Tuesday, which she completed.  After the course ended, she developed what she suspected was right sided lymphadenopathy.  She contacted a tele doctor who prescribed her doxycycline on Friday.  Her symptoms were not alleviated and noted that her lymph nodes felt swollen and her neck tender.  Also noticed in the morning that she developed a "goopy cap" over her salivary glands.  Noted that she had a low grade fever, found it hard to swallow, and today the swelling has worsened and spread to the left side of her neck.    Went to urgent care this morning before arriving at the ED.    Denies: abscess, positive strep test, vomiting    PE: NCAT, Heart sounds RRR, Lungs CTAB, Abd soft, NT/ND, ext WWP.    Clinical course and plan: CT with mild submental swelling noted.  No impending airway obstruction.  Patient has no stridor or respiratory distress on exam.  Plan expand antibiotic coverage to cover anaerobes, follow-up ENT.      Final diagnoses:   Viral pharyngitis     ED Disposition     ED Disposition Condition Date/Time Comment    Discharge  Sun Apr 22, 2018  6:50 PM [name] discharge to home/self care.    Condition at disposition: Stable          Xianna Siverling H. Joseph Art MD       Lenord Fellers, MD  04/25/18 1324

## 2018-04-22 NOTE — Discharge Instructions (Signed)
Dear Mrs. Hailey [name]:    Thank you for choosing the [facility] Emergency Department, the premier emergency department in the [place] area.  I hope your visit today was EXCELLENT.    Specific instructions for your visit today:    Follow up with ENT.     Pharyngitis, Viral    You have been diagnosed with viral pharyngitis. This is the medical term for a sore throat.    Viral pharyngitis is an infection in the back of your throat and tonsils caused by a virus. It may look and feel like a Strep throat, but it usually occurs with other cold symptoms like a runny nose, sinus congestion, cough, fever (temperature higher than 100.85F / 38C), or muscle aches. It is sometimes hard to tell a viral sore throat from Strep throat. Your doctor may do a "rapid strep test" or culture to see which kind of infection you have.    Symptoms include fever, sore throat, painful swallowing, and headache. You may also have symptoms of the common cold and notice swollen tender neck glands. You might have pus or white spots on your tonsils.    Viral pharyngitis is treated with medication for pain and fever. Antibiotics DO NOT cure viral infections and may cause side-effects, like diarrhea, nausea, abdominal cramps or allergic reactions. Using antibiotics when they are not necessary can lead to resistance, meaning antibiotics will not work when you need them in the future.    YOU SHOULD SEEK MEDICAL ATTENTION IMMEDIATELY, EITHER HERE OR AT THE NEAREST EMERGENCY DEPARTMENT, IF ANY OF THE FOLLOWING OCCURS:   You have trouble breathing.   Your voice changes or sounds hoarse.   Your throat pain gets worse or you have neck pain.   You cannot swallow liquids or medication.   You drool or cannot swallow saliva.   You feel worse or don't improve after 2 to 3 days.                 If you do not continue to improve or your condition worsens, please contact your doctor or return immediately to the Emergency  Department.    Sincerely,  Lenord Fellers, MD  Attending Emergency Physician  [facility] Emergency Department    ONSITE PHARMACY  Our full service onsite pharmacy is located in the ER waiting room.  Open 7 days a week from 9 am to 9 pm.  We accept all major insurances and prices are competitive with major retailers.  Ask your provider to print your prescriptions down to the pharmacy to speed you on your way home.    OBTAINING A PRIMARY CARE APPOINTMENT    Primary care physicians (PCPs, also known as primary care doctors) are either internists or family medicine doctors. Both types of PCPs focus on health promotion, disease prevention, patient education and counseling, and treatment of acute and chronic medical conditions.    Call for an appointment with a primary care doctor.  Ask to see who is taking new patients.     [facility]  telephone:  [phone]  [email or web address]    DOCTOR REFERRALS  Call [phone] (available 24 hours a day, 7 days a week) if you need any further referrals and we can help you find a primary care doctor or specialist.  Also, available online at:  [email or web address]    YOUR CONTACT INFORMATION  Before leaving please check with registration to make sure we have an up-to-date contact number.  You can call  registration at [phone] to update your information.  For questions about your hospital bill, please call [phone].  For questions about your Emergency Dept Physician bill please call [phone].      [place]  If you need help with health or social services, please call 2-1-1 for a free referral to resources in your area.  2-1-1 is a free service connecting people with information on health insurance, free clinics, pregnancy, mental health, dental care, food assistance, housing, and substance abuse counseling.  Also, available online at:  http://www.[place].org    MEDICAL RECORDS AND TESTS  Certain laboratory  test results do not come back the same day, for example urine cultures.   We will contact you if other important findings are noted.  Radiology films are often reviewed again to ensure accuracy.  If there is any discrepancy, we will notify you.      Please call [phone] to pick up a complimentary CD of any radiology studies performed.  If you or your doctor would like to request a copy of your medical records, please call [phone].      ORTHOPEDIC INJURY   Please know that significant injuries can exist even when an initial x-ray is read as normal or negative.  This can occur because some fractures (broken bones) are not initially visible on x-rays.  For this reason, close outpatient follow-up with your primary care doctor or bone specialist (orthopedist) is required.    MEDICATIONS AND FOLLOWUP  Please be aware that some prescription medications can cause drowsiness.  Use caution when driving or operating machinery.    The examination and treatment you have received in our Emergency Department is provided on an emergency basis, and is not intended to be a substitute for your primary care physician.  It is important that your doctor checks you again and that you report any new or remaining problems at that time.      [place]  The nearest 24 hour pharmacy is:    [facility] [place], [place] [address]  [place]  [facility])  Call to start or finish an application, compare plans, enroll or ask a question.  [place]: [phone]  Web:  Healthcare.gov    Help Enrolling in [place]  503-030-3113 (TOLL-FREE)  2101094545 (TTY)  Web:  Http://www.coverva.org    Local Help Enrolling in the [place]  770-227-0289 (MAIN)  Email:  health-help@nvfs .org  Web:  http://lewis-perez.info/  Address:  [address] [place], [place] [address]    SEDATING  MEDICATIONS  Sedating medications include strong pain medications (e.g. narcotics), muscle relaxers, benzodiazepines (used for anxiety and as muscle relaxers), Benadryl/diphenhydramine and other antihistamines for allergic reactions/itching, and other medications.  If you are unsure if you have received a sedating medication, please ask your physician or nurse.  If you received a sedating medication: DO NOT drive a car. DO NOT operate machinery. DO NOT perform jobs where you need to be alert.  DO NOT drink alcoholic beverages while taking this medicine.     If you get dizzy, sit or lie down at the first signs. Be careful going up and down stairs.  Be extra careful to prevent falls.     Never give this medicine to others.     Keep this medicine out of reach of  children.     Do not take or save old medicines. Throw them away when outdated.     Keep all medicines in a cool, dry place. DO NOT keep them in your bathroom medicine cabinet or in a cabinet above the stove.    MEDICATION REFILLS  Please be aware that we cannot refill any prescriptions through the ER. If you need further treatment from what is provided at your ER visit, please follow up with your primary care doctor or your pain management specialist.    [place]  Did you know [facility] has two freestanding ERs located just a few miles away?  [facility] [facility] [place] have short wait times, easy free parking directly in front of the building and top patient satisfaction scores - and the same Board Certified Emergency Medicine doctors as [facility].

## 2018-04-26 NOTE — ED Provider Notes (Signed)
[facility] EMERGENCY DEPARTMENT APP H&P         CLINICAL SUMMARY          Diagnosis:    .     Final diagnoses:   Viral pharyngitis         MDM Notes:        MDM  Number of Diagnoses or Management Options  Viral pharyngitis: new, needed workup     Amount and/or Complexity of Data Reviewed  Clinical lab tests: ordered and reviewed  Tests in the radiology section of CPT: reviewed and ordered  Discussion of test results with the performing providers: yes  Discuss the patient with other providers: yes  Independent visualization of images, tracings, or specimens: yes        Disposition:       ED Disposition     ED Disposition Condition Date/Time Comment    Discharge  Sun Apr 22, 2018  6:50 PM [name] discharge to home/self care.    Condition at disposition: Stable             Discharge         Discharge Prescriptions     Medication Sig Dispense Auth. Provider    clindamycin (CLEOCIN) 300 MG capsule Take 1 capsule (300 mg total) by mouth 4 (four) times daily for 7 days 28 capsule Keaton Beichner, Lind Guest, PA                      CLINICAL INFORMATION        HPI:      Chief Complaint: Sore Throat  .    Hailey [name] is a 37 y.o. female who presents with a c/o sore throat.      Pt w/ recent ganglion cyst removal which is healing well; pt w/o complaint.  States she was put on Keflex and shortly thereafter developed a sore throat. Eventually began getting swollen lymph nodes and was prescribed Doxycycline by a Tele MD.  Symptoms did not resolve and eventually began getting a swollen neck.  Has noted that in the morning, she needs to remove a "cap of goop" from her sublingual salivary gland; went to Urgent Care and sent here for evaluation.  Has had 2 negative Strep Tests and 1 negative Mono test.    Pain worse w/ swallowing. Admits to subjective fevers.  Denies SOB/cp, n/v/d, rash, voice changes, HA.    History obtained from: Patient      ROS:      Positive and negative ROS elements as per  HPI.  All other systems reviewed and negative.      Physical Exam:      Pulse 99  BP (!) 164/96  Resp 18  SpO2 94 %  Temp 98.6 F (37 C)    Physical Exam   Constitutional: She is oriented to person, place, and time. She appears well-developed and well-nourished. She is active and cooperative.  Non-toxic appearance. She does not have a sickly appearance. She does not appear ill. No distress.   HENT:   Head: Normocephalic and atraumatic.   Right Ear: External ear normal.   Left Ear: External ear normal.   Mouth/Throat: Oropharynx is clear and moist. No trismus in the jaw. No oropharyngeal exudate, posterior oropharyngeal edema, posterior oropharyngeal erythema or tonsillar abscesses.   Minimal sublingual salivary gland erythema; +ttp   Eyes: Pupils are equal, round, and reactive to light. Conjunctivae and EOM are normal.   Neck: Normal range of  motion and full passive range of motion without pain. No spinous process tenderness and no muscular tenderness present. No neck rigidity. No edema, no erythema and normal range of motion present.   Cardiovascular: Normal rate, regular rhythm and normal heart sounds.  Exam reveals no gallop and no friction rub.    No murmur heard.  Pulmonary/Chest: Effort normal. No respiratory distress. She has no wheezes. She has no rales.   Lymphadenopathy:        Head (right side): No submental adenopathy present.        Head (left side): No submental adenopathy present.   Neurological: She is alert and oriented to person, place, and time. Coordination normal.   Skin: Skin is warm and dry. No rash noted. She is not diaphoretic. No erythema. No pallor.   Psychiatric: She has a normal mood and affect. Her behavior is normal. Judgment and thought content normal. Her speech is not slurred. Cognition and memory are normal. She is communicative. She is attentive.   Nursing note and vitals reviewed.                  PAST HISTORY        Primary Care Provider: Tyler Aas, PA         PMH/PSH:    .     History reviewed. No pertinent past medical history.    She has a past surgical history that includes Hysterectomy; Wisdom tooth extraction; ablasion; D&C DIAGNOSTIC; Cholecystectomy; Ganglion cyst excision; Umbilical hernia repair; and Finger ganglion cyst excision.      Social/Family History:      She reports that she has never smoked. She has never used smokeless tobacco. She reports that she drinks alcohol. Her drug history is not on file.    History reviewed. No pertinent family history.      Listed Medications on Arrival:    .     Discharge Medication List as of 04/22/2018  6:50 PM      CONTINUE these medications which have NOT CHANGED    Details   hydrOXYzine (ATARAX) 10 MG tablet Take 10 mg by mouth every 6 (six) hours as needed, Historical Med      Lifitegrast (XIIDRA) 5 % Solution Apply to eye, Historical Med      loratadine (CLARITIN) 10 MG tablet Take 10 mg by mouth daily, Historical Med      rizatriptan (MAXALT) 10 MG tablet Take 10 mg by mouth once as needed for Migraine May repeat in 2 hours if needed, Historical Med      UNABLE TO FIND Med Name: [facility] for gallbladder, Historical Med            Allergies: She is allergic to latex; other; and oxycodone.            VISIT INFORMATION        Clinical Course in the ED:            Medications Given in the ED:    .     ED Medication Orders     Start Ordered     Status Ordering Provider    04/22/18 1411 04/22/18 1410  dexamethasone (DECADRON) injection 8 mg  Once     Route: Intravenous  Ordered Dose: 8 mg     Last MAR action:  Given Joice Lofts    04/22/18 1353 04/22/18 1352  sodium chloride 0.9 % bolus 1,000 mL  Once     Route: Intravenous  Ordered  Dose: 1,000 mL     Last MAR action:  Stopped Elesa Hacker R    04/22/18 1353 04/22/18 1352  ketorolac (TORADOL) injection 30 mg  Once     Route: Intravenous  Ordered Dose: 30 mg     Last MAR action:  Given GIRMA, BELETSHACHEW R    04/22/18 1353 04/22/18 1352    Once     Route:  Intravenous  Ordered Dose: 4 mg     Discontinued GIRMA, BELETSHACHEW R    04/22/18 1353 04/22/18 1352  ondansetron (ZOFRAN) injection 4 mg  Once     Route: Intravenous  Ordered Dose: 4 mg     Last MAR action:  Given GIRMA, BELETSHACHEW R            Procedures:      Procedures      Interpretations:    O2 sat-           saturation: 94 %; Oxygen use: room air; Interpretation: Normal                   RESULTS        Lab Results:      Results     Procedure Component Value Units Date/Time    PT/APTT [[id number]]  (Abnormal) Collected:  04/22/18 1436     Updated:  04/22/18 1527     PT 12.1 (L) sec      PT INR 0.9     PT Anticoag. Given Within 48 hrs. None     PTT 31 sec     Basic Metabolic Panel [[id number]]  (Abnormal) Collected:  04/22/18 1436    Specimen:  Blood Updated:  04/22/18 1504     Glucose 84 mg/dL      BUN 9.0 mg/dL      Creatinine 0.8 mg/dL      Calcium 78.2 mg/dL      Sodium 956 mEq/L      Potassium 4.4 mEq/L      Chloride 105 mEq/L      CO2 20 (L) mEq/L     GFR [[id number]] Collected:  04/22/18 1436     Updated:  04/22/18 1504     EGFR >60.0    CBC with differential [[id number]]  (Abnormal) Collected:  04/22/18 1436    Specimen:  Blood from Blood Updated:  04/22/18 1455     WBC 9.61 (H) x10 3/uL      Hgb 14.9 (H) g/dL      Hematocrit 41.3 (H) %      Platelets 386 (H) x10 3/uL      RBC 4.98 x10 6/uL      MCV 92.4 fL      MCH 29.9 pg      MCHC 32.4 g/dL      RDW 13 %      MPV 8.5 (L) fL      Neutrophils 60.5 %      Lymphocytes Automated 32.2 %      Monocytes 5.2 %      Eosinophils Automated 1.4 %      Basophils Automated 0.3 %      Immature Granulocyte 0.4 %      Nucleated RBC 0.0 /100 WBC      Neutrophils Absolute 5.82 x10 3/uL      Abs Lymph Automated 3.09 x10 3/uL      Abs Mono Automated 0.50 x10 3/uL      Abs Eos Automated 0.13 x10 3/uL  Absolute Baso Automated 0.03 x10 3/uL      Absolute Immature Granulocyte 0.04 x10 3/uL      Absolute NRBC 0.00 x10 3/uL     Urine HCG, POC/ Qualitative [[id number]]  Collected:  04/22/18 1441    Specimen:  Urine Updated:  04/22/18 1444     POCT QC Pass     POCT Pregnancy HCG Test, UR Negative     Comment: Negative Value is Normal in Healthy Males or Healthy non-pregnant Females              Radiology Results:      CT Soft Tissue Neck with Contrast   Final Result      1. There is slight right submandibular edema. This appears to surround a   soft tissue density which could represent a lymph node, less likely   inflamed accessory salivary tissue. No abnormal fluid collections are   identified. Follow-up is recommended to ascertain resolution.   2. There is slight cervical adenopathy with borderline numerous,   borderline prominent lymph nodes seen in the upper neck.      Terrilee Croak, MD    04/22/2018 5:27 PM                  Scribe Attestation:      No scribe involved in the care of this patient                            Erick Blinks, [place]  04/26/18 2956

## 2018-05-18 ENCOUNTER — Other Ambulatory Visit: Payer: Self-pay | Admitting: Otolaryngology

## 2018-05-18 DIAGNOSIS — B999 Unspecified infectious disease: Secondary | ICD-10-CM

## 2018-05-21 ENCOUNTER — Other Ambulatory Visit: Payer: Self-pay | Admitting: Otolaryngology

## 2018-05-21 ENCOUNTER — Ambulatory Visit: Payer: BLUE CROSS/BLUE SHIELD | Attending: Otolaryngology

## 2018-05-21 DIAGNOSIS — B999 Unspecified infectious disease: Secondary | ICD-10-CM | POA: Insufficient documentation

## 2018-05-21 DIAGNOSIS — R52 Pain, unspecified: Secondary | ICD-10-CM

## 2018-05-21 MED ORDER — GADOBUTROL 1 MMOL/ML IV SOLN
10.00 mL | Freq: Once | INTRAVENOUS | Status: AC | PRN
Start: 2018-05-21 — End: 2018-05-21
  Administered 2018-05-21: 10 mmol via INTRAVENOUS
  Filled 2018-05-21: qty 10

## 2018-06-18 ENCOUNTER — Other Ambulatory Visit: Payer: Self-pay | Admitting: Otolaryngology

## 2018-07-18 ENCOUNTER — Telehealth: Payer: BLUE CROSS/BLUE SHIELD

## 2018-07-18 NOTE — Pre-Procedure Instructions (Signed)
   Required testing FBS (BMI 46%) no other testing required/ordered by surgeon.   Requested lov from infectious disease. (patient with recent sx 04/2018 see ER note) has been worked up for unknown fever for past 3 months.   Called [facility] she will fax labs cbc,cmp to [facility]   Email sent to posting re: Latex allergy

## 2018-07-23 ENCOUNTER — Ambulatory Visit
Admission: RE | Admit: 2018-07-23 | Discharge: 2018-07-23 | Disposition: A | Discharge status: Home / Self Care | Payer: BLUE CROSS/BLUE SHIELD | Source: Ambulatory Visit | Attending: Otolaryngology | Admitting: Otolaryngology

## 2018-07-23 ENCOUNTER — Ambulatory Visit: Payer: BLUE CROSS/BLUE SHIELD | Admitting: Anesthesiology

## 2018-07-23 ENCOUNTER — Ambulatory Visit: Payer: Self-pay

## 2018-07-23 ENCOUNTER — Encounter
Admission: RE | Disposition: A | Discharge status: Home / Self Care | Payer: Self-pay | Source: Ambulatory Visit | Attending: Otolaryngology

## 2018-07-23 DIAGNOSIS — L040 Acute lymphadenitis of face, head and neck: Secondary | ICD-10-CM | POA: Insufficient documentation

## 2018-07-23 DIAGNOSIS — E669 Obesity, unspecified: Secondary | ICD-10-CM | POA: Insufficient documentation

## 2018-07-23 DIAGNOSIS — K219 Gastro-esophageal reflux disease without esophagitis: Secondary | ICD-10-CM | POA: Insufficient documentation

## 2018-07-23 DIAGNOSIS — Z6841 Body Mass Index (BMI) 40.0 and over, adult: Secondary | ICD-10-CM | POA: Insufficient documentation

## 2018-07-23 DIAGNOSIS — R590 Localized enlarged lymph nodes: Secondary | ICD-10-CM

## 2018-07-23 HISTORY — DX: Nausea with vomiting, unspecified: R11.2

## 2018-07-23 HISTORY — DX: Fever, unspecified: R50.9

## 2018-07-23 HISTORY — DX: Diarrhea, unspecified: R19.7

## 2018-07-23 HISTORY — DX: Gastro-esophageal reflux disease without esophagitis: K21.9

## 2018-07-23 HISTORY — DX: Meningitis, unspecified: G03.9

## 2018-07-23 HISTORY — PX: BIOPSY, LYMPH NODE: SHX3250

## 2018-07-23 HISTORY — DX: Enlarged lymph nodes, unspecified: R59.9

## 2018-07-23 HISTORY — DX: Other specified postprocedural states: Z98.890

## 2018-07-23 HISTORY — DX: Body Mass Index (BMI) 40.0 and over, adult: Z684

## 2018-07-23 HISTORY — DX: Scoliosis, unspecified: M41.9

## 2018-07-23 SURGERY — BIOPSY, LYMPH NODE
Anesthesia: Anesthesia General | Site: Neck | Wound class: Clean

## 2018-07-23 MED ORDER — LIDOCAINE HCL (PF) 2 % IJ SOLN
INTRAMUSCULAR | Status: AC
Start: 2018-07-23 — End: ?
  Filled 2018-07-23: qty 5

## 2018-07-23 MED ORDER — CEFAZOLIN SODIUM 1 G IJ SOLR
INTRAMUSCULAR | Status: DC | PRN
Start: 2018-07-23 — End: 2018-07-23
  Administered 2018-07-23: 3 g via INTRAVENOUS

## 2018-07-23 MED ORDER — CEPHALEXIN 500 MG PO CAPS
500.00 mg | ORAL_CAPSULE | Freq: Three times a day (TID) | ORAL | 0 refills | Status: AC
Start: 2018-07-23 — End: 2018-07-30

## 2018-07-23 MED ORDER — SUCCINYLCHOLINE CHLORIDE 20 MG/ML IJ SOLN
INTRAMUSCULAR | Status: DC | PRN
Start: 2018-07-23 — End: 2018-07-23
  Administered 2018-07-23: 200 mg via INTRAVENOUS

## 2018-07-23 MED ORDER — LIDOCAINE-EPINEPHRINE 1 %-1:100000 IJ SOLN
INTRAMUSCULAR | Status: AC
Start: 2018-07-23 — End: ?
  Filled 2018-07-23: qty 20

## 2018-07-23 MED ORDER — PROPOFOL 10 MG/ML IV EMUL (WRAP)
INTRAVENOUS | Status: AC
Start: 2018-07-23 — End: ?
  Filled 2018-07-23: qty 20

## 2018-07-23 MED ORDER — ESMOLOL HCL 100 MG/10ML IV SOLN
INTRAVENOUS | Status: DC | PRN
Start: 2018-07-23 — End: 2018-07-23
  Administered 2018-07-23: 30 mg via INTRAVENOUS

## 2018-07-23 MED ORDER — CEFAZOLIN SODIUM 1 G IJ SOLR
INTRAMUSCULAR | Status: AC
Start: 2018-07-23 — End: ?
  Filled 2018-07-23: qty 1000

## 2018-07-23 MED ORDER — KETOROLAC TROMETHAMINE 30 MG/ML IJ SOLN
INTRAMUSCULAR | Status: DC | PRN
Start: 2018-07-23 — End: 2018-07-23
  Administered 2018-07-23: 30 mg via INTRAVENOUS

## 2018-07-23 MED ORDER — LACTATED RINGERS IV SOLN
100.0000 mL/h | INTRAVENOUS | Status: DC
Start: 2018-07-23 — End: 2018-07-23

## 2018-07-23 MED ORDER — GLYCOPYRROLATE 0.2 MG/ML IJ SOLN
INTRAMUSCULAR | Status: AC
Start: 2018-07-23 — End: ?
  Filled 2018-07-23: qty 1

## 2018-07-23 MED ORDER — SODIUM CHLORIDE 0.9 % IV MBP
1.00 g | INTRAVENOUS | Status: DC
Start: 2018-07-23 — End: 2018-07-23

## 2018-07-23 MED ORDER — FAMOTIDINE 10 MG/ML IV SOLN (WRAP)
INTRAVENOUS | Status: DC | PRN
Start: 2018-07-23 — End: 2018-07-23
  Administered 2018-07-23: 20 mg via INTRAVENOUS

## 2018-07-23 MED ORDER — FENTANYL CITRATE (PF) 50 MCG/ML IJ SOLN (WRAP)
INTRAMUSCULAR | Status: AC
Start: 2018-07-23 — End: ?
  Filled 2018-07-23: qty 2

## 2018-07-23 MED ORDER — PROPOFOL 10 MG/ML IV EMUL (WRAP)
INTRAVENOUS | Status: DC | PRN
Start: 2018-07-23 — End: 2018-07-23
  Administered 2018-07-23 (×2): 30 mg via INTRAVENOUS
  Administered 2018-07-23: 200 mg via INTRAVENOUS
  Administered 2018-07-23: 50 mg via INTRAVENOUS

## 2018-07-23 MED ORDER — FENTANYL CITRATE (PF) 50 MCG/ML IJ SOLN (WRAP)
INTRAMUSCULAR | Status: DC | PRN
Start: 2018-07-23 — End: 2018-07-23
  Administered 2018-07-23 (×2): 50 ug via INTRAVENOUS

## 2018-07-23 MED ORDER — HYDROMORPHONE HCL 2 MG PO TABS
ORAL_TABLET | ORAL | Status: AC
Start: 2018-07-23 — End: 2018-07-23
  Administered 2018-07-23: 2 mg via ORAL
  Filled 2018-07-23: qty 1

## 2018-07-23 MED ORDER — LACTATED RINGERS IV SOLN
INTRAVENOUS | Status: DC
Start: 2018-07-23 — End: 2018-07-23

## 2018-07-23 MED ORDER — NEOSTIGMINE METHYLSULFATE 1 MG/ML IJ/IV SOLN (WRAP)
Status: DC | PRN
Start: 2018-07-23 — End: 2018-07-23
  Administered 2018-07-23: 2 mg via INTRAVENOUS

## 2018-07-23 MED ORDER — FAMOTIDINE 20 MG/2ML IV SOLN
INTRAVENOUS | Status: AC
Start: 2018-07-23 — End: ?
  Filled 2018-07-23: qty 2

## 2018-07-23 MED ORDER — ONDANSETRON HCL 4 MG/2ML IJ SOLN
4.0000 mg | Freq: Once | INTRAMUSCULAR | Status: DC | PRN
Start: 2018-07-23 — End: 2018-07-23

## 2018-07-23 MED ORDER — ROCURONIUM BROMIDE 10 MG/ML IV SOLN (WRAP)
INTRAVENOUS | Status: DC | PRN
Start: 2018-07-23 — End: 2018-07-23
  Administered 2018-07-23: 10 mg via INTRAVENOUS

## 2018-07-23 MED ORDER — PHENYLEPHRINE HCL 10 MG/ML IV SOLN (WRAP)
Status: DC | PRN
Start: 2018-07-23 — End: 2018-07-23
  Administered 2018-07-23: 150 ug via INTRAVENOUS

## 2018-07-23 MED ORDER — ONDANSETRON HCL 4 MG/2ML IJ SOLN
INTRAMUSCULAR | Status: DC | PRN
Start: 2018-07-23 — End: 2018-07-23
  Administered 2018-07-23: 4 mg via INTRAVENOUS

## 2018-07-23 MED ORDER — GLYCOPYRROLATE 0.2 MG/ML IJ SOLN
INTRAMUSCULAR | Status: DC | PRN
Start: 2018-07-23 — End: 2018-07-23
  Administered 2018-07-23: .4 mg via INTRAVENOUS
  Administered 2018-07-23: .2 mg via INTRAVENOUS

## 2018-07-23 MED ORDER — ROCURONIUM BROMIDE 50 MG/5ML IV SOLN
INTRAVENOUS | Status: AC
Start: 2018-07-23 — End: ?
  Filled 2018-07-23: qty 5

## 2018-07-23 MED ORDER — FENTANYL CITRATE (PF) 50 MCG/ML IJ SOLN (WRAP)
50.0000 ug | INTRAMUSCULAR | Status: DC | PRN
Start: 2018-07-23 — End: 2018-07-23

## 2018-07-23 MED ORDER — LIDOCAINE HCL (PF) 1 % IJ SOLN
INTRAMUSCULAR | Status: DC | PRN
Start: 2018-07-23 — End: 2018-07-23
  Administered 2018-07-23: 100 mg via INTRAVENOUS

## 2018-07-23 MED ORDER — FENTANYL CITRATE (PF) 50 MCG/ML IJ SOLN (WRAP)
INTRAMUSCULAR | Status: AC
Start: 2018-07-23 — End: 2018-07-23
  Administered 2018-07-23: 50 ug via INTRAVENOUS
  Filled 2018-07-23: qty 2

## 2018-07-23 MED ORDER — HYDROMORPHONE HCL 2 MG PO TABS
2.0000 mg | ORAL_TABLET | Freq: Once | ORAL | Status: AC | PRN
Start: 2018-07-23 — End: 2018-07-23

## 2018-07-23 MED ORDER — HYDROMORPHONE HCL 0.5 MG/0.5 ML IJ SOLN
0.5000 mg | INTRAMUSCULAR | Status: DC | PRN
Start: 2018-07-23 — End: 2018-07-23

## 2018-07-23 MED ORDER — SUCCINYLCHOLINE CHLORIDE 20 MG/ML IJ SOLN
INTRAMUSCULAR | Status: AC
Start: 2018-07-23 — End: ?
  Filled 2018-07-23: qty 10

## 2018-07-23 SURGICAL SUPPLY — 38 items
ADHESIVE LIQUID WATERPROOF VIAL PREP NONSTAIN MASTISOL STYRAX GUM (Skin Closure) ×1 IMPLANT
ADHESIVE LQ STYRAX GUM MASTIC ALC MTHY (Skin Closure) ×2
CORD ELECTROSURGICAL GREEN 12FT PLASTIC BIPOLAR STERILE DISPOSABLE (Cautery) ×1 IMPLANT
CORD ESURG PLS 12FT STRL BP DISP GRN (Cautery) ×2
DRAIN PENROSE 1/4IN X 18IN (Drain) IMPLANT
DRESSING TRANSPARENT L4 3/4 IN X W4 IN (Dressing) ×1
DRESSING TRANSPARENT L4 3/4 IN X W4 IN POLYURETHANE ADHESIVE (Dressing) ×1 IMPLANT
DRESSING TRNS PU STD TGDRM 4.75X4IN LF (Dressing) ×1
ELECTRODE BLADE (Cautery) ×2 IMPLANT
GLOVE SRG NTR RBR 8 INDCTR BGL 299X103MM (Glove)
GLOVE SURG BIOGEL SZ7.5 (Glove) ×2 IMPLANT
GLOVE SURGICAL 8 INDICATOR BIOGEL POWDER (Glove)
GLOVE SURGICAL 8 INDICATOR BIOGEL POWDER FREE SMOOTH BEAD CUFF (Glove) IMPLANT
SPONGE LAP CTTN 18X18IN LF STRL 4 PLY (Sponge) ×1
SPONGE LAPAROTOMY L18 IN X W18 IN 4 PLY (Sponge) ×1
SPONGE LAPAROTOMY L18 IN X W18 IN 4 PLY RADIOPAQUE PYRONEMA FREE HIGH (Sponge) ×1 IMPLANT
SPONGE PEANUT C5 HOLDER DISSECTOR XRAY (Sponge) ×2
SPONGE PEANUT C5 HOLDER DISSECTOR XRAY DETECTABLE OD3/8 IN DUKAL (Sponge) ×2 IMPLANT
SPONGE PEANUT RADPQE STRL3/8IN (Sponge) ×2
SPONGE SRG VISTEC 8X4IN LF STRL 12 PLY (Sponge) ×1
SPONGE SURGICAL L8 IN X W4 IN 12 PLY (Sponge) ×1
SPONGE SURGICAL L8 IN X W4 IN 12 PLY RADIOPAQUE BAND VISTEC BLUE WHITE (Sponge) ×1 IMPLANT
STAPLER SKIN REG (Skin Closure) ×2 IMPLANT
STRIP SKIN CLOSURE L4 IN X W1/2 IN (Dressing) ×1
STRIP SKIN CLOSURE L4 IN X W1/2 IN REINFORCE STERI-STRIP POLYESTER (Dressing) ×1 IMPLANT
STRIP SKNCLS PLSTR STRSTRP 4X.5IN LF (Dressing) ×1
SUTURE ABS 4-0 PS2 VCL MTPS 18IN BRD (Suture) ×1
SUTURE COATED VICRYL 4-0 PS-2 L18 IN (Suture) ×1
SUTURE NABSB SLK 2-0 PRMHND 18IN BRD TIE (Suture) ×2
SUTURE NABSB SLK 3-0 PRMHND 18IN BRD TIE (Suture) ×3
SUTURE POLYGLACTIN 910 VICRYL 4-0 L18 IN BRAIDED UNIDIRECTIONAL PS-2 (Suture) ×1 IMPLANT
SUTURE SILK 2-0 SH 8X18IN (Suture) ×2 IMPLANT
SUTURE SILK PERMA HAND BLACK 2-0 L18 IN (Suture) ×2
SUTURE SILK PERMA HAND BLACK 2-0 L18 IN BRAID TIES 12 STRAND PRECUT (Suture) ×2 IMPLANT
SUTURE SILK PERMA HAND BLACK 3-0 L18 IN (Suture) ×3
SUTURE SILK PERMA HAND BLACK 3-0 L18 IN BRAID TIES 12 STRAND PRECUT (Suture) ×3 IMPLANT
TRAY NASAL ORAL HEAD AND NECK (Pack) ×2 IMPLANT
TRAY SKIN DRY SCRUB (Tray) ×2 IMPLANT

## 2018-07-23 NOTE — Op Note (Signed)
FULL OPERATIVE NOTE    Date Time: 07/23/18 2:11 PM  Patient Name: Hailey [name]  Attending Physician: Shelda Pal, MD      Date of Operation:   07/23/2018    Providers Performing:   Surgeon(s):  Shelda Pal, MD  Willette Brace, MD    Circulator: Ricardo Jericho, RN  Scrub Person: Alphia Kava    Operative Procedure:   Procedure(s):  BIOPSY, LYMPH NODE    Preoperative Diagnosis:   Cervical Lymphadenitis     Postoperative Diagnosis:   Cervical Lymphadenitis       Indications:   37 year old female with longstanding history of neck discomfort and nightly fevers.  She had slightly enlarged lymph nodes in the submental/submandibular region and after multiples courses of medical therapy including antibiotics and sterois and a needle biopsy was not revealing, it was recommended she undergo excision. The benefits and risks were explained to the patient.  Risks include pain, bleeding, infection, facial nerve injury, non-diagnostic node, need for reoperation, other unforeseen risks.  The patient freely consented.     Operative Notes:   The patient was brought into the operating room and placed supine on the operating room table. After appropriate timeout was performed, the patient was turned over to the anesthesia service for successful induction of general anesthesia and placement of an endotracheal tube. The patient was then turned back over to the Otolaryngology service for the duration of the procedure. The patient was prepped and draped as is standard for a neck procedure. A planned incision was made just right of midline two finger breadths beneath the mandible. This was infiltrated with 1% Lidocaine with 1:100,000 Epinephrine.  An incision was made and carried through the dermis.  A minimal amount of adipose tissue was removed to improve visualization.  The platysma was divided and a level Ib node was found and resected as lymphofatty tissue.  The marginal mandibular nerve was avoided.  This was  partly divided to allow for microbiology workup.  Hemostasis was then obtained and the wound was closed in layers.  The platysma and subcutaneous tissue was reapproximated using 4-0 Vicryl and the skin reapproximated with a subcuticular 5-0 Monocryl.  Dermabond was then applied.  The patient was then turned back over to anesthesia service for successful awakening and extubation.  She tolerated the procedure well.        Estimated Blood Loss:   Minimal    Implants:   * No implants in log *    Drains:   Drains: no    Specimens:   * No specimens in log *      Complications:   None    Signed by: Shelda Pal, MD

## 2018-07-23 NOTE — Discharge Instructions (Signed)
Excisional Biopsy: Neck Lymph Node     Many lymph nodes are found in the neck area. Your healthcare provider can show you which of your lymph nodes is affected.   The lymph nodes are part of the immune system. These small organs are located throughout the body and connected to each other by lymph vessels. There are many of them in the neck. Neck lymph nodes sometimes enlarge. This is most often due to infection, but it can also be caused by cancer. Excisional biopsy helps find the cause of an enlarged lymph node. You may have already had other tests, such as a needle biopsy. But sometimes, your healthcare provider needs more information to diagnose the problem. During an excisional biopsy, the enlarged lymph node is removed. The removed tissue is then sent to a lab for study.  Preparing for the procedure  Follow the directions you were given to prepare for the procedure. Be sure to tell your healthcare provider about all medicines you take. This includes over-the-counter medicines. It also includes herbs, vitamins, and other supplements. You may need to stop taking some or all of them before surgery. Also follow any directions you're given for not eating or drinking before procedure.  The day of the procedure  The procedure usually takes about 60 minutes. Most people go home the same day.  Before the procedure begins  Here is what to expect before surgery:  A small tube called an IV (intravenous) line is put into a vein in your arm or hand. This tube is used to give you fluids and medicines.   You will be given medicine called anesthesia to help you sleep and keep you free of pain during surgery. Local anesthesia may be injected into the area near the lymph node to numb it. You may also get medicine that provides deep sedation. This will make you relaxed and sleepy, but you will be able to breath on your own. Or, you may receive general anesthesia which puts you into a much deeper sleep. When you get general  anesthesia, you will need to have a breathing tube put in and be placed on a breathing machine during the procedure.  During the procedure  Here is what to expect during the procedure:   The skin over the enlarged lymph node is marked and cleaned.   A cut called an incision is made through the skin. If possible, the incision is made within the creases of the neck. This makes it less noticeable when it has healed.   The enlarged lymph node is removed. It is sent to a lab for testing.   The incision is closed with stitches, staples, surgical glue, or surgical strips. Then it is bandaged.   A tube (drain) may be placed near the incision. This drains fluids that may build up after the procedure.  After the procedure  You will be taken to a room to wake up from the anesthesia. You may feel sleepy and nauseated at first. You will get medicine to help with any pain. If you have a drain, you will be shown how to care for it. When you are ready to go home, have an adult family member or friend drive you. Follow your healthcare provider's instructions for recovery, such as:   Take any prescribed medicine as directed.   Care for your incision as instructed.   Don't do strenuous activity until your healthcare provider says it's OK.  Call 911  Call 911 if you have:     Chest pain or trouble breathing  When to call your healthcare provider  Be sure you have a contact number for your healthcare provider so you can get in touch after office hours and on weekends if needed. After you get home, call this number if you have any of the following:     Swelling or pain in your lower legs   Fever of 100.4F (38C) or higher, or as directed by your healthcare provider   Soreness at the biopsy site that's getting worse   Difficulty turning your head   Any increased redness or swelling, warmth, worsening pain, or foul-smelling drainage at the incision site. These could be signs of infection.   Bleeding or bruising at the  incision site   Numbness or tingling in your mouth, jaw, neck, arm, or shoulder   Problems speaking or swallowing   Hoarse voice that worsens  Follow-up  During follow-up visits, your healthcare provider will check on your healing. If you have a drain, it will be removed 1 to 2 days after the procedure. Stitches or staples are removed about 7 to 10 days after the procedure. You and your healthcare provider will also discuss your biopsy results. If cancer was found, further treatment may be needed.  Risks and possible complications  Risks of this procedure include:   Bleeding   Infection   Injury to the nerves near the lymph node   Neck abscess   Scarring   Reopening of the incision   Another enlarged lymph node   Risks of anesthesia.You will discuss these with the anesthesiologist.  Date Last Reviewed: 04/07/2017   2000-2019 [employer] [address], [place] [address]. All rights reserved. This information is not intended as a substitute for professional medical care. Always follow your healthcare professional's instructions.

## 2018-07-23 NOTE — Anesthesia Postprocedure Evaluation (Signed)
Anesthesia Post Evaluation    Patient: Hailey [name]    Procedure(s) with comments:  BIOPSY, LYMPH NODE - NECK DEEP LYMPH NODE BIOPSY    Anesthesia type: general    Last Vitals:   Vitals:    07/23/18 1510   BP: 136/60   Pulse: 96   Resp: 20   Temp:    SpO2: 93%       Anesthesia Post Evaluation:     Patient Evaluated: PACU  Patient Participation: complete - patient participated  Level of Consciousness: awake and alert  Pain Score: 0  Pain Management: adequate    Airway Patency: patent    Anesthetic complications: No      PONV Status: none    Cardiovascular status: acceptable  Respiratory status: acceptable  Hydration status: acceptable  Comments: Minor right upper lip abrasion from intubation        Anesthesia Qualified Clinical Data Registry 2018    PACU Reintubation  Did the Patient have general anesthesia with intubation: Yes  Did the Patient require reintubation in the PACU?: No  Was this a planned exubation trial (documented in the medical record)?: No    PONV Adult  Is the patient aged 20 or older: Yes  Did the patient receive recieve a general anesthestic: Yes  Does the patient have 3 or more risk factors for PONV? No        PONV Pediatric  Is the patient aged 89-17? No            PACU Transfer Checklist Protocol  Was the patient transferred to the PACU at the conclusion of surgery? Yes  Was a checklist or transfer protocol used? Yes    ICU Transfer Checklist Protocol  Was the patient transferred to the ICU at the conclusion of surgery? No      Post-op Pain Assessment Prior to Anesthesia Care End  Age >=18 and assessed for pain in PACU: Yes  Pacu pain score <7/10: Yes      Perioperative Mortality  Perioperative mortality prior to Anesthesia end time: No    Perioperative Cardiac Arrest  Did the patient have an unanticipated intraoperative cardiac arrest between anesthesia start time and anesthesia end time? No    Unplanned Admission to ICU  Did the patient have an unplanned admission to the ICU (not  initially anticipated at anesthesia start time)? No      Signed by: Caroleen Hamman, 07/23/2018 3:27 PM

## 2018-07-23 NOTE — Transfer of Care (Signed)
Anesthesia Transfer of Care Note    Patient: Hailey [name]    Procedures performed: Procedure(s) with comments:  BIOPSY, LYMPH NODE - NECK DEEP LYMPH NODE BIOPSY    Anesthesia type: General ETT    Patient location:PACU    Last vitals:   Vitals:    07/23/18 1126   BP: 146/75   Pulse: 87   Temp: 37.6 C (99.7 F)   SpO2: 96%       Post pain: Patient not complaining of pain, continue current therapy      Mental Status:awake and alert     Respiratory Function: tolerating face mask    Cardiovascular: stable    Nausea/Vomiting: patient not complaining of nausea or vomiting    Hydration Status: adequate    Post assessment: no apparent anesthetic complications, no reportable events and no evidence of recall    Signed by: Asa Lente  07/23/18 2:16 PM

## 2018-07-23 NOTE — H&P (Signed)
Patient seen and examined.  No changes to scanned in H&P.  Plan to proceed with excision of right neck lymph node.  Patient understands benefits, risks, alternatives to procedure.    HAdela Glimpse

## 2018-07-23 NOTE — PACU (Signed)
Family member at bedside.  Pt is awake, alert and oriented.  Pt tolerating PO ice and liquids w/o N/V.  VSS.  D/c instructions reviewed with pts family and prescription given to family.

## 2018-07-23 NOTE — Anesthesia Preprocedure Evaluation (Addendum)
Anesthesia Evaluation    AIRWAY           CARDIOVASCULAR           DENTAL         PULMONARY         OTHER FINDINGS    PONV              Relevant Problems   No relevant active problems               Anesthesia Plan    ASA 3     general                     intravenous induction   Detailed anesthesia plan: general endotracheal        Post op pain management: per surgeon    informed consent obtained    Plan discussed with CRNA.                   Signed by: Caroleen Hamman 07/23/18 8:22 AM    ===============================================================  Inpatient Anesthesia Evaluation    Patient Name: Hailey [name], Hailey [name]  Surgeon: Shelda Pal, MD  Patient Age / Sex: 37 y.o. / female    Medical History:     Past Medical History:   Diagnosis Date   . BMI 45.0-49.9, adult    . Diarrhea     occasional   . Fever     for past ~ 3 months (avg daily fever 100-101)   . Gastroesophageal reflux disease    . Meningitis    . Post-operative nausea and vomiting    . Scoliosis    . Swollen lymph nodes        Past Surgical History:   Procedure Laterality Date   . ablasion  2005   . CHOLECYSTECTOMY  2009   . D&C DIAGNOSTIC  2010   . FINGER GANGLION CYST EXCISION  04/2018   . GANGLION CYST EXCISION  2014    04/2018 cyst removed from index finger-[facility]   . HYSTERECTOMY  2012   . UMBILICAL HERNIA REPAIR  2016   . WISDOM TOOTH EXTRACTION  1998         Allergies:     Allergies   Allergen Reactions   . Latex Other (See Comments)     Swelling and redness throughout body    . Other      Tropical fruits like coconuts, pineapple, kiwi, etc.   . Oxycodone Other (See Comments)     Hallucinations         Medications:     No current facility-administered medications for this encounter.               Prior to Admission medications    Medication Sig Start Date End Date Taking? Authorizing Provider   hydrOXYzine (ATARAX) 10 MG tablet Take 10 mg by mouth every 6 (six) hours as needed   Yes [provider]    Lifitegrast (XIIDRA) 5 % Solution Apply 1 drop to eye 2 (two) times daily       Yes [provider]   loratadine (CLARITIN) 10 MG tablet Take 10 mg by mouth daily   Yes [provider]   rizatriptan (MAXALT) 10 MG tablet Take 10 mg by mouth once as needed for Migraine May repeat in 2 hours if needed   Yes [provider]   UNABLE TO FIND 625 mg 2 (two) times daily Med Name: [facility]  for gallbladder       Yes [provider]     Vitals        Wt Readings from Last 3 Encounters:   04/22/18 131.5 kg (290 lb)     BMI (Estimated body mass index is 46.38 kg/m as calculated from the following:    Height as of this encounter: 1.727 m (5\' 8" ).    Weight as of this encounter: 138.3 kg (305 lb).)  Temp Readings from Last 3 Encounters:   04/22/18 36.8 C (98.2 F) (Oral)     BP Readings from Last 3 Encounters:   04/22/18 (!) 137/91     Pulse Readings from Last 3 Encounters:   04/22/18 84           Labs:   CBC:  Lab Results   Component Value Date    WBC 9.61 (H) 04/22/2018    HGB 14.9 (H) 04/22/2018    HCT 46.0 (H) 04/22/2018    PLT 386 (H) 04/22/2018       Chemistries:  Lab Results   Component Value Date    NA 136 04/22/2018    K 4.4 04/22/2018    CL 105 04/22/2018    CO2 20 (L) 04/22/2018    BUN 9.0 04/22/2018    CREAT 0.8 04/22/2018    GLU 84 04/22/2018    CA 10.2 04/22/2018       Coags:  Lab Results   Component Value Date    PT 12.1 (L) 04/22/2018    PTT 31 04/22/2018    INR 0.9 04/22/2018     _____________________      Signed by: Caroleen Hamman  07/23/18   8:22 AM    =============================================================

## 2018-07-23 NOTE — Discharge Instr - AVS First Page (Signed)
Do not scrub incision    Okay to shower in am

## 2018-07-24 ENCOUNTER — Ambulatory Visit: Payer: Self-pay | Admitting: Obstetrics and Gynecology

## 2018-07-24 ENCOUNTER — Encounter: Payer: Self-pay | Admitting: Otolaryngology

## 2018-07-24 LAB — LAB USE ONLY - HISTORICAL NON-GYN MEDICAL CYTOLOGY

## 2018-07-27 LAB — LAB USE ONLY - HISTORICAL SURGICAL PATHOLOGY

## 2018-08-13 ENCOUNTER — Other Ambulatory Visit: Payer: Self-pay | Admitting: Infectious Disease

## 2018-08-17 ENCOUNTER — Other Ambulatory Visit: Payer: Self-pay | Admitting: Infectious Disease

## 2018-08-17 DIAGNOSIS — R52 Pain, unspecified: Secondary | ICD-10-CM

## 2018-08-19 ENCOUNTER — Ambulatory Visit
Admission: RE | Admit: 2018-08-19 | Discharge: 2018-08-19 | Disposition: A | Discharge status: Home / Self Care | Payer: BLUE CROSS/BLUE SHIELD | Source: Ambulatory Visit | Attending: Infectious Disease | Admitting: Infectious Disease

## 2018-08-19 ENCOUNTER — Other Ambulatory Visit: Payer: Self-pay | Admitting: Infectious Disease

## 2018-08-19 DIAGNOSIS — R52 Pain, unspecified: Secondary | ICD-10-CM

## 2018-08-19 DIAGNOSIS — R509 Fever, unspecified: Secondary | ICD-10-CM | POA: Insufficient documentation

## 2018-08-19 DIAGNOSIS — Z09 Encounter for follow-up examination after completed treatment for conditions other than malignant neoplasm: Secondary | ICD-10-CM

## 2018-08-19 DIAGNOSIS — R590 Localized enlarged lymph nodes: Secondary | ICD-10-CM | POA: Insufficient documentation

## 2018-08-19 MED ORDER — GADOBUTROL 1 MMOL/ML IV SOLN
10.00 mL | Freq: Once | INTRAVENOUS | Status: AC | PRN
Start: 2018-08-19 — End: 2018-08-19
  Administered 2018-08-19: 10 mmol via INTRAVENOUS
  Filled 2018-08-19: qty 10

## 2018-09-01 ENCOUNTER — Other Ambulatory Visit: Payer: Self-pay | Admitting: Infectious Disease

## 2018-09-13 ENCOUNTER — Other Ambulatory Visit: Payer: Self-pay | Admitting: Infectious Disease

## 2018-09-20 ENCOUNTER — Other Ambulatory Visit: Payer: Self-pay | Admitting: Infectious Disease

## 2018-09-27 ENCOUNTER — Other Ambulatory Visit: Payer: Self-pay | Admitting: Infectious Disease

## 2018-11-07 HISTORY — PX: BONE MARROW BIOPSY: SHX199

## 2018-11-27 ENCOUNTER — Ambulatory Visit: Payer: BLUE CROSS/BLUE SHIELD | Admitting: Obstetrics and Gynecology

## 2018-11-27 ENCOUNTER — Encounter: Payer: Self-pay | Admitting: Obstetrics and Gynecology

## 2018-11-27 VITALS — BP 126/74 | Ht 68.0 in | Wt 319.0 lb

## 2018-11-27 DIAGNOSIS — Z8742 Personal history of other diseases of the female genital tract: Secondary | ICD-10-CM

## 2018-11-27 DIAGNOSIS — R509 Fever, unspecified: Secondary | ICD-10-CM

## 2018-11-27 DIAGNOSIS — Z1151 Encounter for screening for human papillomavirus (HPV): Secondary | ICD-10-CM
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
## 2018-11-28 ENCOUNTER — Encounter: Payer: Self-pay | Admitting: Obstetrics and Gynecology

## 2018-11-28 LAB — ESTRADIOL: Estradiol: 53.1 pg/mL

## 2018-11-28 LAB — FOLLICLE STIMULATING HORMONE: Follicle Stimulating Hormone: 6 m[IU]/mL

## 2018-11-29 ENCOUNTER — Encounter: Payer: Self-pay | Admitting: Advanced Practice Midwife

## 2018-11-29 NOTE — Progress Notes (Signed)
Please notify pt that blood work is not indicative of menopause.  Also can you pls get the op note and pathology from that hospital in [place]?   thanks

## 2018-12-01 LAB — IMAGE GUIDED PAP WITH APTIMA HPV TP
.: 0
HPV APTIMA: NEGATIVE

## 2018-12-02 NOTE — Progress Notes (Signed)
Please call pt.  HPV testing off vaginal pap (she has had a hysterectomy) is negative.  Not enough cells retrieved from vaginal pap so we will repeat it again next year.

## 2018-12-03 ENCOUNTER — Telehealth: Payer: Self-pay

## 2018-12-03 NOTE — Telephone Encounter (Signed)
.  Left voice message on pt voice mail labs results were delivered.

## 2018-12-03 NOTE — Telephone Encounter (Signed)
-----   Message from Dipa Joylene John, MD sent at 12/02/2018 11:36 AM EST -----  Please call pt.  HPV testing off vaginal pap (she has had a hysterectomy) is negative.  Not enough cells retrieved from vaginal pap so we will repeat it again next year.

## 2018-12-13 NOTE — Progress Notes (Signed)
Spoke to pt is aware of results, still awaiting records from 2012 op note.

## 2018-12-14 ENCOUNTER — Encounter: Payer: Self-pay | Admitting: Obstetrics and Gynecology

## 2018-12-24 ENCOUNTER — Encounter: Payer: Self-pay | Admitting: Obstetrics and Gynecology

## 2018-12-24 NOTE — Progress Notes (Signed)
Pathology records reviewed:  11/26/2010 - pathology from hysterectomy benign.   In specimen: uterus and cervix.  Fibroids noted.  Proliferative endometrium.  No dysplasia, hyperplasia, or malignancy.

## 2019-01-17 ENCOUNTER — Telehealth: Payer: Self-pay

## 2019-01-17 NOTE — Telephone Encounter (Signed)
Spoke to patient in regards to scheduling. Patient will have records faxed over for review. Referred Dr. Canary Brim ph: [phone]. dx Fever of unknown origin, Ferretin level high. Best contact number for pt ph: [phone].

## 2019-01-18 ENCOUNTER — Telehealth: Payer: Self-pay

## 2019-01-22 ENCOUNTER — Telehealth: Payer: Self-pay

## 2019-04-14 NOTE — Progress Notes (Signed)
HEMATOLOGY VIDEO-CONFERENCE  ENCOUNTER DATE:04/16/2019    Verbal consent has been obtained from the patient to participate in a videoconference visit to minimize exposure to COVID-19. The time spent during this visit discussing medical issues and recommendations was 22 minutes.     HISTORY RELATING TO HEMATOLOGY ISSUES:  Patient is a 38 year old with a one year history of unexplained, low grade fevers, fatigue, malaise, and intermittent tender lymphadenopathy for which she has had extensive evaluation including thorough ID and ENT consultations.  She has not lost weight and hematologic studies have been stably normal.  She was noted to have a moderately high serum ferritin.    Vital signs reviewed.  Had temp of 101 this am (tympanic)  On visual exam, patient appears comfortable, alert/oriented, without pallor or scleral icterus. In good spirits.    HEMATOLOGY FOCUSED ROS:    General  +fever, +generalized weakness +fatigue.   Heme: No bleeding, bruising, rash, or swelling in the neck, underarms or groin.  No pain or swelling in upper or lower extremities.  All other systems were reviewed and were negative except as indicated in the HPI.      ASSESSMENT:  Fevers, fatigue and malaise. I suspect underlying inflammation, perhaps relating to obesity. Modestly elevated ferritin would be consistent with this. In the absence of an elevated TSat, very unlikely an iron overload syndrome at play (e.g., hemochromatosis).  Infectious workup has been thorough.  Possibly indolent lymphoproliferative disease is at the root or autoimmune disorder.    RECOMMENDATIONS:  Will check cbc, cmp, esr, ana screen, hsCRP, spep, sIFE, serum free light chains, and IL-6 levels.   Contingency - bone marrow asp/bx

## 2019-04-16 ENCOUNTER — Ambulatory Visit: Payer: BLUE CROSS/BLUE SHIELD | Attending: Hematology & Oncology | Admitting: Hematology & Oncology

## 2019-04-16 ENCOUNTER — Other Ambulatory Visit (FREE_STANDING_LABORATORY_FACILITY): Payer: BLUE CROSS/BLUE SHIELD

## 2019-04-16 ENCOUNTER — Encounter: Payer: Self-pay | Admitting: Hematology & Oncology

## 2019-04-16 VITALS — Temp 101.0°F | Ht 68.0 in | Wt 315.0 lb

## 2019-04-16 DIAGNOSIS — R7989 Other specified abnormal findings of blood chemistry: Secondary | ICD-10-CM

## 2019-04-16 DIAGNOSIS — R509 Fever, unspecified: Secondary | ICD-10-CM

## 2019-04-16 LAB — COMPREHENSIVE METABOLIC PANEL
ALT: 45 U/L (ref 0–55)
AST (SGOT): 29 U/L (ref 5–34)
Albumin/Globulin Ratio: 1.1 (ref 0.9–2.2)
Albumin: 4.2 g/dL (ref 3.5–5.0)
Alkaline Phosphatase: 78 U/L (ref 37–106)
BUN: 12 mg/dL (ref 7.0–19.0)
Bilirubin, Total: 0.5 mg/dL (ref 0.1–1.2)
CO2: 21 mEq/L (ref 21–29)
Calcium: 9.2 mg/dL (ref 8.5–10.5)
Chloride: 107 mEq/L (ref 100–111)
Creatinine: 0.8 mg/dL (ref 0.4–1.5)
Globulin: 3.7 g/dL (ref 2.0–3.7)
Glucose: 98 mg/dL (ref 70–100)
Potassium: 4.1 mEq/L (ref 3.5–5.1)
Protein, Total: 7.9 g/dL (ref 6.0–8.3)
Sodium: 137 mEq/L (ref 136–145)

## 2019-04-16 LAB — CBC AND DIFFERENTIAL
Absolute NRBC: 0 10*3/uL (ref 0.00–0.00)
Basophils Absolute Automated: 0.03 10*3/uL (ref 0.00–0.08)
Basophils Automated: 0.3 %
Eosinophils Absolute Automated: 0.11 10*3/uL (ref 0.00–0.44)
Eosinophils Automated: 1.2 %
Hematocrit: 43.4 % (ref 34.7–43.7)
Hgb: 14.5 g/dL (ref 11.4–14.8)
Immature Granulocytes Absolute: 0.04 10*3/uL (ref 0.00–0.07)
Immature Granulocytes: 0.4 %
Lymphocytes Absolute Automated: 2.54 10*3/uL (ref 0.42–3.22)
Lymphocytes Automated: 28.4 %
MCH: 30.5 pg (ref 25.1–33.5)
MCHC: 33.4 g/dL (ref 31.5–35.8)
MCV: 91.2 fL (ref 78.0–96.0)
MPV: 8.2 fL — ABNORMAL LOW (ref 8.9–12.5)
Monocytes Absolute Automated: 0.59 10*3/uL (ref 0.21–0.85)
Monocytes: 6.6 %
Neutrophils Absolute: 5.64 10*3/uL (ref 1.10–6.33)
Neutrophils: 63.1 %
Nucleated RBC: 0 /100 WBC (ref 0.0–0.0)
Platelets: 370 10*3/uL — ABNORMAL HIGH (ref 142–346)
RBC: 4.76 10*6/uL (ref 3.90–5.10)
RDW: 13 % (ref 11–15)
WBC: 8.95 10*3/uL (ref 3.10–9.50)

## 2019-04-16 LAB — GFR: EGFR: >60

## 2019-04-16 LAB — HEMOLYSIS INDEX: Hemolysis Index: 5 (ref 0–18)

## 2019-04-16 LAB — SEDIMENTATION RATE: Sed Rate: 11 mm/Hr (ref 0–20)

## 2019-04-16 LAB — LACTATE DEHYDROGENASE: LDH: 206 U/L (ref 125–331)

## 2019-04-16 LAB — C-REACTIVE PROTEIN HIGH SENSITIVE: C-Reactive Protein, High Sensitive: 0.24 mg/dL (ref 0.00–0.50)

## 2019-04-16 NOTE — Addendum Note (Signed)
Addended by: Higinio Plan on: 04/16/2019 09:49 AM     Modules accepted: Orders

## 2019-04-17 LAB — PROTEIN ELECTROPHORESIS, SERUM
Albumin %: 49.8 % (ref 46.6–62.6)
Albumin, Synovial: 3.7 g/dL (ref 3.4–4.8)
Alpha-1 Glob %: 3 % (ref 1.7–4.1)
Alpha-1 Globulin: 0.2 g/dL (ref 0.1–0.4)
Alpha-2 Glob %: 13.9 % (ref 8.9–14.9)
Alpha-2 Globulin: 1 g/dL (ref 0.8–1.2)
Beta Glob %: 16.7 % (ref 10.9–18.9)
Beta Globulin: 1.3 g/dL — ABNORMAL HIGH (ref 0.6–1.2)
Gamma Globulin %: 16.6 % (ref 9.8–24.4)
Gamma Globulin: 1.2 g/dL (ref 0.6–1.7)
Protein, Total: 7.5 g/dL (ref 6.0–8.3)

## 2019-04-17 LAB — IMMUNOFIXATION ELECTROPHORESIS

## 2019-04-17 LAB — FREE KAPPA & LAMBDA LIGHT CHAINS PLUS RATIO, QUANTITATIVE, SERUM
Free Kappa, Serum: 14.5 mg/L (ref 3.3–19.4)
Free Kappa/Lambda Ratio: 1.69 — ABNORMAL HIGH (ref 0.26–1.65)
Free Lambda, Serum: 8.6 mg/L (ref 5.7–26.3)

## 2019-04-17 LAB — ANA SCREEN REFLEX: ANA Screen Reflex: NEGATIVE

## 2019-04-18 LAB — INTERLEUKIN-6: Interleukin 6: 4.2 pg/mL (ref 0.0–15.5)

## 2019-04-20 LAB — IFE REVIEW, SERUM

## 2019-04-20 LAB — SERUM PROTEIN ELECTROPHORESIS REVIEW

## 2019-04-23 ENCOUNTER — Telehealth: Payer: Self-pay

## 2019-04-23 DIAGNOSIS — R509 Fever, unspecified: Secondary | ICD-10-CM

## 2019-04-23 NOTE — Telephone Encounter (Signed)
-----   Message from Tivis Ringer, MD sent at 04/22/2019  5:50 PM EDT -----  Please schedule a bone marrow aspirate and biopsy for Hailey [name].  Dx - FUO.  Thanks

## 2019-04-23 NOTE — Telephone Encounter (Addendum)
bmbx order entered  Scheduled at [facility] 6/26 at 10am, arrive at 9am, NPO after MN, no NSAIDS 5 days, have a driver, park in green garage    Pt will need COVID testing 1-4 days prior    Reviewed above with pt - voiced understanding    Mailed copy of labs and COVID test order to pts home address

## 2019-04-24 LAB — COMPREHENSIVE METABOLIC PANEL
ALT: 43 IU/L — ABNORMAL HIGH (ref 0–32)
AST (SGOT): 24 IU/L (ref 0–40)
Albumin/Globulin Ratio: 1.8 (ref 1.2–2.2)
Albumin: 4.9 g/dL (ref 3.5–5.5)
Alkaline Phosphatase: 76 IU/L (ref 39–117)
BUN / Creatinine Ratio: 22 (ref 9–23)
BUN: 16 mg/dL (ref 6–20)
Bilirubin, Total: 0.3 mg/dL (ref 0.0–1.2)
CO2: 25 mmol/L (ref 20–29)
Calcium: 9.6 mg/dL (ref 8.7–10.2)
Chloride: 100 mmol/L (ref 96–106)
Creatinine: 0.73 mg/dL (ref 0.57–1.00)
EGFR: 106 mL/min/{1.73_m2} (ref >59)
EGFR: 122 mL/min/{1.73_m2} (ref >59)
Globulin, Total: 2.8 g/dL (ref 1.5–4.5)
Glucose: 93 mg/dL (ref 65–99)
Potassium: 4.9 mmol/L (ref 3.5–5.2)
Protein, Total: 7.7 g/dL (ref 6.0–8.5)
Sodium: 139 mmol/L (ref 134–144)

## 2019-04-24 LAB — CBC AND DIFFERENTIAL
Baso(Absolute): 0 10*3/uL (ref 0.0–0.2)
Basos: 0 %
Eos: 2 %
Eosinophils Absolute: 0.1 10*3/uL (ref 0.0–0.4)
Hematocrit: 41.1 % (ref 34.0–46.6)
Hemoglobin: 14.1 g/dL (ref 11.1–15.9)
Immature Granulocytes Absolute: 0 10*3/uL (ref 0.0–0.1)
Immature Granulocytes: 0 %
Lymphocytes Absolute: 2.6 10*3/uL (ref 0.7–3.1)
Lymphocytes: 32 %
MCH: 31.1 pg (ref 26.6–33.0)
MCHC: 34.3 g/dL (ref 31.5–35.7)
MCV: 91 fL (ref 79–97)
Monocytes Absolute: 0.6 10*3/uL (ref 0.1–0.9)
Monocytes: 7 %
Neutrophils Absolute: 4.8 10*3/uL (ref 1.4–7.0)
Neutrophils: 59 %
Platelets: 391 10*3/uL — ABNORMAL HIGH (ref 150–379)
RBC: 4.54 x10E6/uL (ref 3.77–5.28)
RDW: 13.5 % (ref 12.3–15.4)
WBC: 8.2 10*3/uL (ref 3.4–10.8)

## 2019-04-24 LAB — TSH: TSH: 2.51 u[IU]/mL (ref 0.450–4.500)

## 2019-04-24 LAB — LIPID PANEL, WITHOUT TOTAL CHOLESTEROL/HDL RATIO, SERUM
Cholesterol: 182 mg/dL (ref 100–199)
HDL: 43 mg/dL (ref >39)
LDL Calculated: 117 mg/dL — ABNORMAL HIGH (ref 0–99)
Triglycerides: 112 mg/dL (ref 0–149)
VLDL Calculated: 22 mg/dL (ref 5–40)

## 2019-04-24 LAB — HEMOGLOBIN A1C: Hemoglobin A1C: 5.7 % — ABNORMAL HIGH (ref 4.8–5.6)

## 2019-04-26 ENCOUNTER — Encounter: Payer: Self-pay | Admitting: Hematology & Oncology

## 2019-04-30 ENCOUNTER — Ambulatory Visit (FREE_STANDING_LABORATORY_FACILITY): Payer: BLUE CROSS/BLUE SHIELD

## 2019-04-30 DIAGNOSIS — Z1159 Encounter for screening for other viral diseases: Secondary | ICD-10-CM

## 2019-04-30 DIAGNOSIS — Z01818 Encounter for other preprocedural examination: Secondary | ICD-10-CM

## 2019-05-01 LAB — COVID-19 (SARS-COV-2): SARS CoV 2 Overall Result: NOT DETECTED

## 2019-05-03 ENCOUNTER — Ambulatory Visit
Admission: RE | Admit: 2019-05-03 | Discharge: 2019-05-03 | Disposition: A | Discharge status: Home / Self Care | Payer: BLUE CROSS/BLUE SHIELD | Source: Ambulatory Visit | Attending: Hematology & Oncology | Admitting: Hematology & Oncology

## 2019-05-03 ENCOUNTER — Other Ambulatory Visit: Payer: Self-pay | Admitting: Hematology & Oncology

## 2019-05-03 DIAGNOSIS — R509 Fever, unspecified: Secondary | ICD-10-CM

## 2019-05-03 MED ORDER — FENTANYL CITRATE (PF) 50 MCG/ML IJ SOLN (WRAP)
INTRAMUSCULAR | Status: AC | PRN
Start: 2019-05-03 — End: 2019-05-03
  Administered 2019-05-03: 50 ug via INTRAVENOUS

## 2019-05-03 MED ORDER — MIDAZOLAM HCL 1 MG/ML IJ SOLN (WRAP)
INTRAMUSCULAR | Status: AC | PRN
Start: 2019-05-03 — End: 2019-05-03
  Administered 2019-05-03: 2 mg via INTRAVENOUS

## 2019-05-03 MED ORDER — ACETAMINOPHEN 325 MG PO TABS
650.0000 mg | ORAL_TABLET | ORAL | Status: DC | PRN
Start: 2019-05-03 — End: 2019-05-04

## 2019-05-03 MED ORDER — MIDAZOLAM HCL 1 MG/ML IJ SOLN (WRAP)
INTRAMUSCULAR | Status: AC | PRN
Start: 2019-05-03 — End: 2019-05-03
  Administered 2019-05-03: 1 mg via INTRAVENOUS

## 2019-05-03 MED ORDER — ONDANSETRON HCL 4 MG/2ML IJ SOLN
INTRAMUSCULAR | Status: AC
Start: 2019-05-03 — End: ?
  Filled 2019-05-03: qty 2

## 2019-05-03 MED ORDER — ONDANSETRON HCL 4 MG/2ML IJ SOLN
INTRAMUSCULAR | Status: AC | PRN
Start: 2019-05-03 — End: 2019-05-03
  Administered 2019-05-03: 4 mg via INTRAVENOUS

## 2019-05-03 NOTE — Sedation Documentation (Signed)
Pt had a left bone marrow biopsy completed by Dr Alfonso Ellis, pt tolerated the procedure well, VSS, no complaints of pain at this time, Fentanyl and Versed 6mg  IV given throughout procedure for pt comfort, 2x2 dressing to biopsy site CDI, will transfer to recovery area.

## 2019-05-03 NOTE — Discharge Instructions (Signed)
Interventional Radiology  Discharge Instructions following Biopsy    You have had a CT guided bone marrow biopsy performed by Dr. Alfonso Ellis on 05/03/2019.    A biopsy is a small sample of tissue or fluid taken from the body.   Image-guided biopsy allows an Interventional Radiologist take a sample from an organ or mass without surgery.  This sample can then be studied in a laboratory.     General Instructions:  1. Rest today post procedure.  2. Avoid aspirin and aspirin-like products (NSAIDS - Motrin, Ibuprofen, Advil, Aleve, Naproxen) for the next three days unless otherwise instructed by your doctor.  3. No heavy lifting (greater than 5 pounds) or straining for the next 72 hours (3 days).  4. No driving for 24 hours post procedure due to medication/sedation you may have received during your procedure.  5. Avoid alcohol for the next 24 hours after the procedure if you received sedation.    Site Care:   1. You may shower and remove the dressing tomorrow.    2. You may leave the site uncovered or replace with a new dry dressing or Band-Aid each day until healed.  3. Do NOT use creams, powders, lotions, or ointments at the site.   4. Bruising sometimes occurs at the site where the needle was inserted.    Call  the Interventional Radiologist if you observe:  1. Prolonged oozing of blood from the biopsy site  2. Pain at the site for more than 3 days    3. Increased pain or unrelieved pain  4. Dizziness or lightheadedness  5. Difficulty breathing or shortness of breath  6. Redness, warmth to touch, or discharge from biopsy site  7. Coughing up blood (more than 3 teaspoons of blood is significant)  8. Chest pain      If you have questions or concerns, please call an Interventional Radiologist:    Contact Numbers:    Regular business hours (8A-5P M-F):  [facility]:  [phone] option 3    After hours:  Answering service:  [phone]

## 2019-05-03 NOTE — H&P (Signed)
BRIEF PRE-PROCEDURE H&P    Date Time: 05/03/19 10:30 AM    PROCEDURE:     CT guided bone marrow aspirate and biopsy    INDICATION:     Fever of unknown origin    REVIEW OF SYSTEMS REVIEWED:   YES  (X  )      CURRENT MEDICATION REVIEWED   YES  (X  )      ALLERGIES:     Allergies   Allergen Reactions    Latex Other (See Comments)     Swelling and redness throughout body     Other      Tropical fruits like coconuts, pineapple, kiwi, etc.    Oxycodone Other (See Comments)     Hallucinations       PREVIOUS REACTION TO SEDATION MEDICATIONS     NO (X )   YES ( )    PHYSICAL EXAM     AIRWAY CLASSIFICATION:    CLASS I   (  )     CLASS II  (  )    CLASS III  (X  )     CLASS IV  (  )    INTUBATED (  )    CARDIAC :   (X  )  RRR  (  )  TACHYCARDIC  (  )  IRREG  (  )  MURMUR    LUNGS:   (X  )  CLEAR  (  )  DIMINISHED    (  ) RIGHT   (  )  LEFT  (  )  ABSENT          (  ) RIGHT   (  )  LEFT  (  )  TUBES            (  ) RIGHT   (  )  LEFT      ABDOMEN:   Soft,nontender      NEURO:   Alert and oriented X 3      ASA PHYSICAL STATUS   (  )  ASA 1   HEALTHY PATIENT  (  )  ASA 2   MILD SYSTEMIC ILLNESS  ( X )  ASA 3   SYSTEMIC DISEASE, NOT INCAPACITATING  (  )  ASA 4   SEVERE SYSTEMIC DISEASE, DISEASE IS CONSTANT THREAT TO LIFE                         (  )  ASA 5   MORIBUND CONDITION, NOT EXPECTED TO LIVE >24 HOURS            IRRESPECTIVE OF PROCEDURE  (  )  E           EMERGENCY PROCEDURE       PLANNED SEDATION:   (  ) NO SEDATION  (X  ) MODERATE SEDATION  (  ) DEEP SEDATION WITH ANESTHESIA      CONCLUSION:   PATIENT HAS BEEN REASSESSED IMMEDIATELY PRIOR TO THE PROCEDURE   AND IS AN APPROPRIATE CANDIDATE FOR THE PLANNED SEDATION AND   PROCEDURE.      I EXPLAINED THE RISKS, BENEFITS AND ALTERNATIVES TO THE PLANNED   PROCEDURE AND SEDATION TO THE PATIENT. RISKS INCLUDE, BUT ARE NOT LIMITED TO:  BLEEDING.     ALL QUESTIONS WERE ANSWERED. THE PATIENT AGREED TO PROCEED AFTER THIS DISCUSSION.    ( X )  YES  (  )  EMERGENCY CONSENT          Signed by Senaida Lange.

## 2019-05-03 NOTE — Progress Notes (Signed)
Patient arrived to recovery area post CT guided bone marrow biopsy. Patient resting comfortably, denies pain.

## 2019-05-03 NOTE — Progress Notes (Signed)
Patient complaining of nausea post procedure, Zofran given by Rene Kocher.

## 2019-05-03 NOTE — Progress Notes (Signed)
Discharge instructions reviewed, all questions answered. Patient discharged home with husband via wheelchair.

## 2019-05-03 NOTE — Progress Notes (Signed)
Patient tolerating PO food and fluids without complaints of nausea.

## 2019-05-03 NOTE — Procedures (Signed)
BRIEF POST- PROCEDURE NOTE      PROCEDURE:     CT guided bone marrow aspirate and biopsy    ANESTHESIA:     Moderate sedation  Local    POST-OPERATIVE DIAGNOSIS:     fever    OPERATIVE FINDINGS:     No mass    SPECIMENS REMOVED:     Aspirate and core biopsy from posterior left iliac bone    ESTIMATED BLOOD LOSS:     No significant bleed    COMPLICATIONS:     None immediately      Signed by Senaida Lange.

## 2019-05-06 LAB — LAB USE ONLY - HISTORICAL NON-GYN MEDICAL CYTOLOGY

## 2019-05-07 LAB — LAB USE ONLY - HISTORICAL SURGICAL PATHOLOGY

## 2019-05-13 NOTE — Progress Notes (Signed)
HEMATOLOGY VIDEO-CONFERENCE  ENCOUNTER DATE:04/16/2019    Verbal consent has been obtained from the patient to participate in a videoconference visit to minimize exposure to COVID-19. The time spent during this visit discussing medical issues and recommendations was 20 minutes.     HISTORY RELATING TO HEMATOLOGY ISSUES:  Patient is a 38 year old with a one year history of unexplained, low grade fevers, fatigue, malaise, and intermittent tender lymphadenopathy for which she has had extensive evaluation including thorough ID and ENT consultations.  She has not lost weight and hematologic studies have been stably normal.  She was noted to have a moderately high serum ferritin.    BONE MARROW 05/03/2019     DIAGNOSIS:     BONE MARROW, LEFT POSTERIOR ILIAC CREST, CORE BIOPSY, CLOT SECTION,   ASPIRATE SMEAR AND TOUCH PREP:     - NORMOCELLULAR BONE MARROW (approximate50-60%) WITH TRILINEAGE HEMATOPOIESIS,   NORMAL MYELOID TO ERYTHROID RATIO AND ADEQUATE NUMBERS OF   MEGAKARYOCYTES WITH UNREMARKABLE MORPHOLOGY     - A FEW SMALL REACTIVE LYMPHOID AGGREGATES IDENTIFIED, AND MILDLY   INCREASED NUMBERS OF EOSINOPHILS     - PENDING CYTOGENETIC STUDIES OF BONE MARROW SAMPLE - Remain pending as of 05/14/2019.    COMMENT:   The concurrent flow cytometry performed on the bone marrow aspirate   sample shows no evidence of immunophenotypic abnormalities     <Electronically Signed>   RUI HE, MD     INTERVAL NOTES:  05/14/2019 - Remains generally well, but with persistent fevers.  Marrow (as above) did not show evidence for lymphoma or leukemia but there were increased numbers of eosinophils and a few lymphoid aggregates.      HEMATOLOGY FOCUSED ROS:  General  +fever, +generalized weakness +fatigue.   Heme: No bleeding, bruising, rash, or swelling in the neck, underarms or groin.  No pain or swelling in upper or lower extremities.  All other systems were reviewed and were negative except as indicated in the  HPI.      PHYSICAL EXAMINATION:  General: well appearing, well nourished, NAD  HEENT: Anicteric sclera   Neck: no goiter visible  Lymph: no visible cervical / supraclavicular LAD   Resp: breaths even and unlabored, no audible wheezing, speaks full sentences no SOB  CV: no edema no cyanosis  Abd: non-distended  Skin: no visible lesions or rashes  Neuro: alert, oriented, face symmetric. Moves arms freely and symmetrically.  Mental Status: cooperative, appropriate  Psych: appropriate affect, judgement intact      ASSESSMENT:  Fevers, fatigue and malaise. I suspect underlying inflammation, perhaps relating to obesity. Modestly elevated ferritin would be consistent with this although low/normal IL-6, not so. In the absence of an elevated TSat, very unlikely an iron overload syndrome at play (e.g., hemochromatosis).  Infectious workup has been thorough.  Possibly indolent lymphoproliferative disease is at the root or autoimmune disorder although bone marrow findings are reassuring in this regard.  Mildly elevated eosinophils in the marrow raising concern forpossible for underlying allergy.    RECOMMENDATIONS:  Will discuss marrow findings with ID (Dr. Ilene Qua).  In light of eosinophils in marrow, will discuss a course of prednisone to see if fever picture is affected.

## 2019-05-14 ENCOUNTER — Ambulatory Visit: Payer: BLUE CROSS/BLUE SHIELD | Attending: Hematology & Oncology | Admitting: Hematology & Oncology

## 2019-05-14 ENCOUNTER — Encounter: Payer: Self-pay | Admitting: Hematology & Oncology

## 2019-05-14 DIAGNOSIS — R5383 Other fatigue: Secondary | ICD-10-CM

## 2019-05-14 DIAGNOSIS — R509 Fever, unspecified: Secondary | ICD-10-CM

## 2019-05-21 ENCOUNTER — Encounter: Payer: Self-pay | Admitting: Hematology & Oncology

## 2019-05-22 ENCOUNTER — Telehealth (HOSPITAL_BASED_OUTPATIENT_CLINIC_OR_DEPARTMENT_OTHER): Payer: Self-pay

## 2019-05-22 DIAGNOSIS — R509 Fever, unspecified: Secondary | ICD-10-CM

## 2019-05-22 NOTE — Telephone Encounter (Signed)
From  [name] To  [facility] Triage Pool Sent  05/21/2019 8:25 AM   Good morning,     I wondered if there is any update regarding discussing a course of steroids with my Infectious Disease doctor (Dr. Ilene Qua)? Since the follow up appointment last week, I haven't heard whether steroids are still the plan or not.     Thank you,     Hailey [name]

## 2019-05-22 NOTE — Telephone Encounter (Signed)
WBE says he will call Dr. today.

## 2019-05-23 MED ORDER — PREDNISONE 20 MG PO TABS
20.0000 mg | ORAL_TABLET | Freq: Every day | ORAL | 0 refills | Status: DC
Start: 2019-05-23 — End: 2019-06-04

## 2019-05-23 NOTE — Telephone Encounter (Addendum)
WBE spoke with Dr.Poretz  RN Spoke with patient    Ok for steroid trial  prednisone 20mg  daily start now for 2 weeks  Patient to schedule a zoom visit with WBE   in 2 weeks

## 2019-05-23 NOTE — Telephone Encounter (Signed)
Patient access  Please schedule 2 week follow up and call patient   Let me know if I can help in anyway   Thank you

## 2019-05-23 NOTE — Addendum Note (Signed)
Addended by: Gwenette Greet on: 05/23/2019 02:36 PM     Modules accepted: Orders

## 2019-05-24 NOTE — Telephone Encounter (Signed)
Please provide an apt. No available apts. We can schedule for a video visit in the pm?

## 2019-05-24 NOTE — Telephone Encounter (Signed)
Pt is scheduled to be seen on 7/30 at 2:40 pm.   Patient has been notified via mychart re apt.

## 2019-05-24 NOTE — Telephone Encounter (Signed)
WBE says that is fine to add to the end of the day.

## 2019-06-02 NOTE — Progress Notes (Signed)
HEMATOLOGY VIDEO-CONFERENCE  ENCOUNTER DATE:06/04/2019    Verbal consent has been obtained from the patient to participate in a videoconference visit to minimize exposure to COVID-19. The time spent during this visit discussing medical issues and recommendations was 20 minutes.     HISTORY RELATING TO HEMATOLOGY ISSUES:  Patient is a 38 year old with a one year history of unexplained, low grade fevers, fatigue, malaise, and intermittent tender lymphadenopathy for which she has had extensive evaluation including thorough ID and ENT consultations.  She has not lost weight and hematologic studies have been stably normal.  She was noted to have a moderately high serum ferritin.    BONE MARROW 05/03/2019     DIAGNOSIS:     BONE MARROW, LEFT POSTERIOR ILIAC CREST, CORE BIOPSY, CLOT SECTION,   ASPIRATE SMEAR AND TOUCH PREP:     - NORMOCELLULAR BONE MARROW (approximate50-60%) WITH TRILINEAGE HEMATOPOIESIS,   NORMAL MYELOID TO ERYTHROID RATIO AND ADEQUATE NUMBERS OF   MEGAKARYOCYTES WITH UNREMARKABLE MORPHOLOGY     - A FEW SMALL REACTIVE LYMPHOID AGGREGATES IDENTIFIED, AND MILDLY   INCREASED NUMBERS OF EOSINOPHILS     - PENDING CYTOGENETIC STUDIES OF BONE MARROW SAMPLE - Remain pending as of 05/14/2019.    COMMENT:   The concurrent flow cytometry performed on the bone marrow aspirate   sample shows no evidence of immunophenotypic abnormalities     <Electronically Signed>   RUI HE, MD     INTERVAL NOTES:  05/14/2019 - Remains generally well, but with persistent fevers.  Marrow (as above) did not show evidence for lymphoma or leukemia but there were increased numbers of eosinophils and a few lymphoid aggregates.      06/04/2019 - Discussed marrow findings with Dr. Ilene Qua.  Started trial of prednisone 20 mg/day.  Now, ~2 weeks on steroid, fevers may be slightly reduced, but still present.  She is experiencing some mild agitation, but for the most part is tolerating the drug well.  But still with daily  fever ~101.      HEMATOLOGY FOCUSED ROS:  General  +fever, +generalized weakness +fatigue.   Heme: No bleeding, bruising, rash, or swelling in the neck, underarms or groin.  No pain or swelling in upper or lower extremities.  All other systems were reviewed and were negative except as indicated in the HPI.      PHYSICAL EXAMINATION:  General: well appearing, well nourished, NAD  HEENT: Anicteric sclera   Neck: no goiter visible  Lymph: no visible cervical / supraclavicular LAD   Resp: breaths even and unlabored, no audible wheezing, speaks full sentences no SOB  CV: no edema no cyanosis  Abd: non-distended  Skin: no visible lesions or rashes  Neuro: alert, oriented, face symmetric. Moves arms freely and symmetrically.  Mental Status: cooperative, appropriate  Psych: appropriate affect, judgement intact      ASSESSMENT:  Fevers, fatigue and malaise. I suspect underlying inflammation, perhaps relating to obesity. Modestly elevated ferritin would be consistent with this although low/normal IL-6, not so. In the absence of an elevated TSat, very unlikely an iron overload syndrome at play (e.g., hemochromatosis).  Infectious workup has been thorough.  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  Mildly elevated eosinophils in the marrow raising concern for possible for underlying allergy.  Minor clinical response to 20 mg prednisone x2 weeks.      RECOMMENDATIONS:  Continue prednisone but will increase to 40 mg for one week  then back to 20 mg/day.    Will reevaluate in 3 weeks.

## 2019-06-04 ENCOUNTER — Encounter: Payer: Self-pay | Admitting: Hematology & Oncology

## 2019-06-04 ENCOUNTER — Ambulatory Visit: Payer: BLUE CROSS/BLUE SHIELD | Attending: Hematology & Oncology | Admitting: Hematology & Oncology

## 2019-06-04 VITALS — Temp 100.1°F | Wt 315.0 lb

## 2019-06-04 DIAGNOSIS — R509 Fever, unspecified: Secondary | ICD-10-CM

## 2019-06-04 MED ORDER — PREDNISONE 20 MG PO TABS
ORAL_TABLET | ORAL | 0 refills | Status: DC
Start: 2019-06-04 — End: 2019-07-03

## 2019-06-06 ENCOUNTER — Ambulatory Visit: Payer: BLUE CROSS/BLUE SHIELD | Admitting: Hematology & Oncology

## 2019-07-01 NOTE — Progress Notes (Signed)
HEMATOLOGY VIDEO-CONFERENCE  ENCOUNTER DATE:06/04/2019    Verbal consent has been obtained from the patient to participate in a videoconference visit to minimize exposure to COVID-19. The time spent during this visit discussing medical issues and recommendations was 20 minutes.     HISTORY RELATING TO HEMATOLOGY ISSUES:  Patient is a 38 year old with a one year history of unexplained, low grade fevers, fatigue, malaise, and intermittent tender lymphadenopathy for which she has had extensive evaluation including thorough ID and ENT consultations.  She has not lost weight and hematologic studies have been stably normal.  She was noted to have a moderately high serum ferritin.    BONE MARROW 05/03/2019     DIAGNOSIS:     BONE MARROW, LEFT POSTERIOR ILIAC CREST, CORE BIOPSY, CLOT SECTION,   ASPIRATE SMEAR AND TOUCH PREP:     - NORMOCELLULAR BONE MARROW (approximate50-60%) WITH TRILINEAGE HEMATOPOIESIS,   NORMAL MYELOID TO ERYTHROID RATIO AND ADEQUATE NUMBERS OF   MEGAKARYOCYTES WITH UNREMARKABLE MORPHOLOGY     - A FEW SMALL REACTIVE LYMPHOID AGGREGATES IDENTIFIED, AND MILDLY   INCREASED NUMBERS OF EOSINOPHILS     - PENDING CYTOGENETIC STUDIES OF BONE MARROW SAMPLE - Remain pending as of 05/14/2019.    COMMENT:   The concurrent flow cytometry performed on the bone marrow aspirate   sample shows no evidence of immunophenotypic abnormalities     <Electronically Signed>   RUI HE, MD     INTERVAL NOTES:  05/14/2019 - Remains generally well, but with persistent fevers.  Marrow (as above) did not show evidence for lymphoma or leukemia but there were increased numbers of eosinophils and a few lymphoid aggregates.      06/04/2019 - Discussed marrow findings with Dr. Ilene Qua.  Started trial of prednisone 20 mg/day.  Now, ~2 weeks on steroid, fevers may be slightly reduced, but still present.  She is experiencing some mild agitation, but for the most part is tolerating the drug well.  But still with daily  fever ~101.      07/02/2019 - Remains on prednisone with a brief bump to 40 mg and then back to 20 mg/day.  Fevers have persisted. Transiently less on steroids but recurred to high level when steroid dose reduced.  C/O b/l TMJ discomfort.      HEMATOLOGY FOCUSED ROS:  General  +fever, +generalized weakness +fatigue.   Heme: No bleeding, bruising, rash, or swelling in the neck, underarms or groin.  No pain or swelling in upper or lower extremities.  All other systems were reviewed and were negative except as indicated in the HPI.      PHYSICAL EXAMINATION:  General: well appearing, well nourished, NAD  HEENT: Anicteric sclera   Neck: no goiter visible  Lymph: no visible cervical / supraclavicular LAD   Resp: breaths even and unlabored, no audible wheezing, speaks full sentences no SOB  CV: no edema no cyanosis  Abd: non-distended  Skin: no visible lesions or rashes  Neuro: alert, oriented, face symmetric. Moves arms freely and symmetrically.  Mental Status: cooperative, appropriate  Psych: appropriate affect, judgement intact      ASSESSMENT:  Fevers, fatigue and malaise. I suspect underlying inflammation, perhaps relating to obesity. Modestly elevated ferritin would be consistent with this although low/normal IL-6, not so. In the absence of an elevated TSat, very unlikely an iron overload syndrome at play (e.g., hemochromatosis).  Infectious workup has been thorough.  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  Mildly elevated eosinophils in the marrow raising concern for possible for underlying allergy. Clinical response to steroid treatment has been minimal.      RECOMMENDATIONS:  Continue prednisone at 20 mg/day.    Will reevaluate in 3 weeks.

## 2019-07-02 ENCOUNTER — Ambulatory Visit: Payer: BLUE CROSS/BLUE SHIELD | Attending: Hematology & Oncology | Admitting: Hematology & Oncology

## 2019-07-02 ENCOUNTER — Encounter: Payer: Self-pay | Admitting: Hematology & Oncology

## 2019-07-02 VITALS — Temp 100.6°F | Wt 315.0 lb

## 2019-07-02 DIAGNOSIS — R509 Fever, unspecified: Secondary | ICD-10-CM

## 2019-07-03 ENCOUNTER — Other Ambulatory Visit (FREE_STANDING_LABORATORY_FACILITY): Payer: BLUE CROSS/BLUE SHIELD

## 2019-07-03 ENCOUNTER — Other Ambulatory Visit: Payer: Self-pay

## 2019-07-03 ENCOUNTER — Encounter: Payer: Self-pay | Admitting: Hematology & Oncology

## 2019-07-03 DIAGNOSIS — R509 Fever, unspecified: Secondary | ICD-10-CM

## 2019-07-03 LAB — COMPREHENSIVE METABOLIC PANEL
ALT: 56 U/L — ABNORMAL HIGH (ref 0–55)
AST (SGOT): 24 U/L (ref 5–34)
Albumin/Globulin Ratio: 1.2 (ref 0.9–2.2)
Albumin: 4 g/dL (ref 3.5–5.0)
Alkaline Phosphatase: 58 U/L (ref 37–106)
Anion Gap: 10 (ref 5.0–15.0)
BUN: 17 mg/dL (ref 7.0–19.0)
Bilirubin, Total: 0.4 mg/dL (ref 0.1–1.2)
CO2: 25 mEq/L (ref 21–29)
Calcium: 9.6 mg/dL (ref 8.5–10.5)
Chloride: 105 mEq/L (ref 100–111)
Creatinine: 0.9 mg/dL (ref 0.4–1.5)
Globulin: 3.3 g/dL (ref 2.0–3.7)
Glucose: 90 mg/dL (ref 70–100)
Potassium: 4.7 mEq/L (ref 3.5–5.1)
Protein, Total: 7.3 g/dL (ref 6.0–8.3)
Sodium: 140 mEq/L (ref 136–145)

## 2019-07-03 LAB — CBC WITH MANUAL DIFFERENTIAL
Absolute NRBC: 0 10*3/uL (ref 0.00–0.00)
Band Neutrophils Absolute: 0 10*3/uL (ref 0.00–1.00)
Band Neutrophils: 0 %
Basophils Absolute Manual: 0 10*3/uL (ref 0.00–0.08)
Basophils Manual: 0 %
Cell Morphology: NORMAL
Eosinophils Absolute Manual: 0.11 10*3/uL (ref 0.00–0.44)
Eosinophils Manual: 1 %
Hematocrit: 42.9 % (ref 34.7–43.7)
Hgb: 13.9 g/dL (ref 11.4–14.8)
Lymphocytes Absolute Manual: 4.91 10*3/uL — ABNORMAL HIGH (ref 0.42–3.22)
Lymphocytes Manual: 44 %
MCH: 29.8 pg (ref 25.1–33.5)
MCHC: 32.4 g/dL (ref 31.5–35.8)
MCV: 91.9 fL (ref 78.0–96.0)
MPV: 8.1 fL — ABNORMAL LOW (ref 8.9–12.5)
Monocytes Absolute: 0.67 10*3/uL (ref 0.21–0.85)
Monocytes Manual: 6 %
Neutrophils Absolute Manual: 5.47 10*3/uL (ref 1.10–6.33)
Nucleated RBC: 0 /100 WBC (ref 0.0–0.0)
Platelet Estimate: INCREASED — AB
Platelets: 391 10*3/uL — ABNORMAL HIGH (ref 142–346)
RBC: 4.67 10*6/uL (ref 3.90–5.10)
RDW: 13 % (ref 11–15)
Segmented Neutrophils: 49 %
WBC: 11.17 10*3/uL — ABNORMAL HIGH (ref 3.10–9.50)

## 2019-07-03 LAB — GFR: EGFR: >60

## 2019-07-03 MED ORDER — PREDNISONE 20 MG PO TABS
ORAL_TABLET | ORAL | 0 refills | Status: DC
Start: 2019-07-03 — End: 2019-07-29

## 2019-07-17 ENCOUNTER — Encounter: Payer: Self-pay | Admitting: Hematology & Oncology

## 2019-07-24 NOTE — Progress Notes (Signed)
HEMATOLOGY VIDEO-CONFERENCE  ENCOUNTER DATE:07/25/2019    Verbal consent has been obtained from the patient to participate in a videoconference visit to minimize exposure to COVID-19. The time spent during this visit discussing medical issues and recommendations was 15 minutes.     HISTORY RELATING TO HEMATOLOGY ISSUES:  Patient is a 38 year old with a one year history of unexplained, low grade fevers, fatigue, malaise, and intermittent tender lymphadenopathy for which she has had extensive evaluation including thorough ID and ENT consultations.  She has not lost weight and hematologic studies have been stably normal.  She was noted to have a moderately high serum ferritin.    BONE MARROW 05/03/2019     DIAGNOSIS:     BONE MARROW, LEFT POSTERIOR ILIAC CREST, CORE BIOPSY, CLOT SECTION,   ASPIRATE SMEAR AND TOUCH PREP:     - NORMOCELLULAR BONE MARROW (approximate50-60%) WITH TRILINEAGE HEMATOPOIESIS,   NORMAL MYELOID TO ERYTHROID RATIO AND ADEQUATE NUMBERS OF   MEGAKARYOCYTES WITH UNREMARKABLE MORPHOLOGY     - A FEW SMALL REACTIVE LYMPHOID AGGREGATES IDENTIFIED, AND MILDLY   INCREASED NUMBERS OF EOSINOPHILS     - PENDING CYTOGENETIC STUDIES OF BONE MARROW SAMPLE - Remain pending as of 05/14/2019.    COMMENT:   The concurrent flow cytometry performed on the bone marrow aspirate   sample shows no evidence of immunophenotypic abnormalities     <Electronically Signed>   RUI HE, MD     INTERVAL NOTES:  05/14/2019 - Remains generally well, but with persistent fevers.  Marrow (as above) did not show evidence for lymphoma or leukemia but there were increased numbers of eosinophils and a few lymphoid aggregates.      06/04/2019 - Discussed marrow findings with Dr. Ilene Qua.  Started trial of prednisone 20 mg/day.  Now, ~2 weeks on steroid, fevers may be slightly reduced, but still present.  She is experiencing some mild agitation, but for the most part is tolerating the drug well.  But still with daily  fever ~101.      07/02/2019 - Remains on prednisone with a brief bump to 40 mg and then back to 20 mg/day.  Fevers have persisted. Transiently less on steroids but recurred to high level when steroid dose reduced.  C/O b/l TMJ discomfort.    07/25/2019 -  Continues with fever, for a few days as high as 103.  No localizing symptoms.  Remains on prednisone 20 mg/day and has had some 'puffiness', particularly hands. This interferes with piano.  Able to work full time (at home).  Overall, no improvement with steroid trial. Last CBC showed an increase wbc to 11.17 with the difference being in an increase in lymphocyte fraction (TLc 4/910/uL)/      HEMATOLOGY FOCUSED ROS:  General  +fever, +generalized weakness +fatigue.   Heme: No bleeding, bruising, rash, or swelling in the neck, underarms or groin.  No pain or swelling in upper or lower extremities.  All other systems were reviewed and were negative except as indicated in the HPI.      PHYSICAL EXAMINATION:  General: well appearing, well nourished, NAD  HEENT: Anicteric sclera   Neck: no goiter visible  Lymph: no visible cervical / supraclavicular LAD   Resp: breaths even and unlabored, no audible wheezing, speaks full sentences no SOB  CV: no edema no cyanosis  Abd: non-distended  Skin: no visible lesions or rashes  Neuro: alert, oriented, face symmetric. Moves arms freely and symmetrically.  Mental Status: cooperative, appropriate  Psych: appropriate affect,  judgement intact      ASSESSMENT:  Fevers, fatigue and malaise. I suspect underlying inflammation, perhaps relating to obesity. Modestly elevated ferritin would be consistent with this although low/normal IL-6, not so. In the absence of an elevated TSat, very unlikely an iron overload syndrome at play (e.g., hemochromatosis).  Infectious workup has been thorough.  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  Mildly elevated  eosinophils in the marrow raising concern for possible for underlying allergy. Clinical response to steroid treatment has been minimal.      RECOMMENDATIONS:   Continue prednisone at 20 mg/day for now.     Repeat cbc with manual dif, viral studies (EBV. CMV), flow cytometry (leukemia, lymphoma panel), ANA.   After labs drawn, will start gradual taper down of prednisone.     Will reevaluate in 3 weeks.

## 2019-07-25 ENCOUNTER — Ambulatory Visit: Payer: BLUE CROSS/BLUE SHIELD | Attending: Hematology & Oncology | Admitting: Hematology & Oncology

## 2019-07-25 VITALS — Temp 101.1°F | Wt 315.0 lb

## 2019-07-25 DIAGNOSIS — R509 Fever, unspecified: Secondary | ICD-10-CM

## 2019-07-25 DIAGNOSIS — R7989 Other specified abnormal findings of blood chemistry: Secondary | ICD-10-CM

## 2019-07-26 ENCOUNTER — Encounter: Payer: Self-pay | Admitting: Hematology & Oncology

## 2019-07-26 ENCOUNTER — Other Ambulatory Visit (FREE_STANDING_LABORATORY_FACILITY): Payer: BLUE CROSS/BLUE SHIELD

## 2019-07-26 DIAGNOSIS — R7989 Other specified abnormal findings of blood chemistry: Secondary | ICD-10-CM

## 2019-07-26 DIAGNOSIS — R509 Fever, unspecified: Secondary | ICD-10-CM

## 2019-07-26 LAB — LACTATE DEHYDROGENASE: LDH: 234 U/L (ref 125–331)

## 2019-07-26 LAB — CYTOMEGALOVIRUS ANTIBODY, IGM: CMV Ab, IgM: <8

## 2019-07-26 LAB — CBC WITH MANUAL DIFFERENTIAL
Absolute NRBC: 0 10*3/uL (ref 0.00–0.00)
Atypical Lymphocytes %: 5 %
Atypical Lymphocytes Absolute: 0.66 10*3/uL — ABNORMAL HIGH (ref 0.00–0.00)
Band Neutrophils Absolute: 0 10*3/uL (ref 0.00–1.00)
Band Neutrophils: 0 %
Basophils Absolute Manual: 0 10*3/uL (ref 0.00–0.08)
Basophils Manual: 0 %
Cell Morphology: NORMAL
Eosinophils Absolute Manual: 0 10*3/uL (ref 0.00–0.44)
Eosinophils Manual: 0 %
Hematocrit: 42.8 % (ref 34.7–43.7)
Hgb: 14.1 g/dL (ref 11.4–14.8)
Lymphocytes Absolute Manual: 4.51 10*3/uL — ABNORMAL HIGH (ref 0.42–3.22)
Lymphocytes Manual: 34 %
MCH: 30.1 pg (ref 25.1–33.5)
MCHC: 32.9 g/dL (ref 31.5–35.8)
MCV: 91.3 fL (ref 78.0–96.0)
MPV: 8.3 fL — ABNORMAL LOW (ref 8.9–12.5)
Monocytes Absolute: 0.27 10*3/uL (ref 0.21–0.85)
Monocytes Manual: 2 %
Neutrophils Absolute Manual: 7.82 10*3/uL — ABNORMAL HIGH (ref 1.10–6.33)
Nucleated RBC: 0 /100 WBC (ref 0.0–0.0)
Platelet Estimate: INCREASED — AB
Platelets: 392 10*3/uL — ABNORMAL HIGH (ref 142–346)
RBC: 4.69 10*6/uL (ref 3.90–5.10)
RDW: 14 % (ref 11–15)
Segmented Neutrophils: 59 %
WBC: 13.26 10*3/uL — ABNORMAL HIGH (ref 3.10–9.50)

## 2019-07-26 LAB — COMPREHENSIVE METABOLIC PANEL
ALT: 50 U/L (ref 0–55)
AST (SGOT): 23 U/L (ref 5–34)
Albumin/Globulin Ratio: 1.3 (ref 0.9–2.2)
Albumin: 4.1 g/dL (ref 3.5–5.0)
Alkaline Phosphatase: 63 U/L (ref 37–106)
Anion Gap: 12 (ref 5.0–15.0)
BUN: 15.2 mg/dL (ref 7.0–19.0)
Bilirubin, Total: 0.4 mg/dL (ref 0.1–1.2)
CO2: 23 mEq/L (ref 21–29)
Calcium: 9.6 mg/dL (ref 8.5–10.5)
Chloride: 104 mEq/L (ref 100–111)
Creatinine: 0.8 mg/dL (ref 0.4–1.5)
Globulin: 3.2 g/dL (ref 2.0–3.7)
Glucose: 88 mg/dL (ref 70–100)
Potassium: 3.7 mEq/L (ref 3.5–5.1)
Protein, Total: 7.3 g/dL (ref 6.0–8.3)
Sodium: 139 mEq/L (ref 136–145)

## 2019-07-26 LAB — FERRITIN: Ferritin: 176.64 ng/mL (ref 4.60–204.00)

## 2019-07-26 LAB — SEDIMENTATION RATE: Sed Rate: 9 mm/Hr (ref 0–20)

## 2019-07-26 LAB — HEMOLYSIS INDEX: Hemolysis Index: 7 (ref 0–18)

## 2019-07-26 LAB — CYTOMEGALOVIRUS ANTIBODY, IGG: CMV AB, IgG: <0.2

## 2019-07-26 LAB — GFR: EGFR: >60

## 2019-07-28 LAB — EBV AB PROFILE, S
EBV VCA Ab, IgG: >600 U/mL — ABNORMAL HIGH (ref 0.0–17.9)
EBV VCA Ab, IgM: <36 U/mL (ref 0.0–35.9)
Epstein-Barr virus, Nuclear AG AB: 330 U/mL — ABNORMAL HIGH (ref 0.0–17.9)

## 2019-07-29 ENCOUNTER — Other Ambulatory Visit: Payer: Self-pay

## 2019-07-29 DIAGNOSIS — R509 Fever, unspecified: Secondary | ICD-10-CM

## 2019-07-29 LAB — LEUKEMIA/LYMPHOMA EVALUATION PANEL
Number of Markers:: 22
Viability: 97 %

## 2019-07-29 LAB — ANA SCREEN REFLEX: ANA Screen Reflex: NEGATIVE

## 2019-07-29 MED ORDER — PREDNISONE 20 MG PO TABS
ORAL_TABLET | ORAL | 0 refills | Status: DC
Start: 2019-07-29 — End: 2020-01-09

## 2019-07-31 ENCOUNTER — Encounter: Payer: Self-pay | Admitting: Hematology & Oncology

## 2019-08-21 NOTE — Progress Notes (Signed)
HEMATOLOGY VIDEO-CONFERENCE  ENCOUNTER DATE:08/22/2019    Verbal consent has been obtained from the patient to participate in a videoconference visit to minimize exposure to COVID-19. The time spent during this visit discussing medical issues and recommendations was 15 minutes.     HISTORY RELATING TO HEMATOLOGY ISSUES:  Patient is a 38 year old with a one year history of unexplained, low grade fevers, fatigue, malaise, and intermittent tender lymphadenopathy for which she has had extensive evaluation including thorough ID and ENT consultations.  She has not lost weight and hematologic studies have been stably normal.  She was noted to have a moderately high serum ferritin.    BONE MARROW 05/03/2019     DIAGNOSIS:     BONE MARROW, LEFT POSTERIOR ILIAC CREST, CORE BIOPSY, CLOT SECTION,   ASPIRATE SMEAR AND TOUCH PREP:     - NORMOCELLULAR BONE MARROW (approximate50-60%) WITH TRILINEAGE HEMATOPOIESIS,   NORMAL MYELOID TO ERYTHROID RATIO AND ADEQUATE NUMBERS OF   MEGAKARYOCYTES WITH UNREMARKABLE MORPHOLOGY     - A FEW SMALL REACTIVE LYMPHOID AGGREGATES IDENTIFIED, AND MILDLY   INCREASED NUMBERS OF EOSINOPHILS     - PENDING CYTOGENETIC STUDIES OF BONE MARROW SAMPLE - Remain pending as of 05/14/2019.    COMMENT:   The concurrent flow cytometry performed on the bone marrow aspirate   sample shows no evidence of immunophenotypic abnormalities     <Electronically Signed>   RUI HE, MD     INTERVAL NOTES:  05/14/2019 - Remains generally well, but with persistent fevers.  Marrow (as above) did not show evidence for lymphoma or leukemia but there were increased numbers of eosinophils and a few lymphoid aggregates.      06/04/2019 - Discussed marrow findings with Dr. Ilene Qua.  Started trial of prednisone 20 mg/day.  Now, ~2 weeks on steroid, fevers may be slightly reduced, but still present.  She is experiencing some mild agitation, but for the most part is tolerating the drug well.  But still with daily  fever ~101.      07/02/2019 - Remains on prednisone with a brief bump to 40 mg and then back to 20 mg/day.  Fevers have persisted. Transiently less on steroids but recurred to high level when steroid dose reduced.  C/O b/l TMJ discomfort.    07/25/2019 -  Continues with fever, for a few days as high as 103.  No localizing symptoms.  Remains on prednisone 20 mg/day and has had some 'puffiness', particularly hands. This interferes with piano.  Able to work full time (at home).  Overall, no improvement with steroid trial. Last CBC showed an increase wbc to 11.17 with the difference being in an increase in lymphocyte fraction (TLc 4,910/uL).    08/23/2019 - Continues with fevers, occasionally as high as 103.  Feels she is having more right neck and face swelling.  Now off prednisone.       HEMATOLOGY FOCUSED ROS:  General  +fever, +generalized weakness +fatigue.   Heme: No bleeding, bruising, rash, or swelling in the neck, underarms or groin.  No pain or swelling in upper or lower extremities.  All other systems were reviewed and were negative except as indicated in the HPI.      PHYSICAL EXAMINATION:  General: well appearing, well nourished, NAD  HEENT: Anicteric sclera   Neck: no goiter visible  Lymph: no visible cervical / supraclavicular LAD   Resp: breaths even and unlabored, no audible wheezing, speaks full sentences no SOB  CV: no edema no cyanosis  Abd: non-distended  Skin: no visible lesions or rashes  Neuro: alert, oriented, face symmetric. Moves arms freely and symmetrically.  Mental Status: cooperative, appropriate  Psych: appropriate affect, judgement intact      ASSESSMENT:  Fevers, fatigue and malaise. I suspect underlying inflammatory process, perhaps relating to obesity. Modestly elevated ferritin would be consistent with this although low/normal IL-6, not so. In the absence of an elevated TSat, very unlikely an iron overload syndrome at play (e.g., hemochromatosis).  Infectious workup has been thorough.   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  Mildly elevated eosinophils in the marrow raising concern for possible for underlying allergy. Clinical response to steroid treatment has been minimal.      RECOMMENDATIONS:   Continue OFF prednisone     Will check MRI face/neck - c/w study one year ago.    Will reevaluate in after MRI.

## 2019-08-22 ENCOUNTER — Ambulatory Visit: Payer: BLUE CROSS/BLUE SHIELD | Attending: Hematology & Oncology | Admitting: Hematology & Oncology

## 2019-08-22 ENCOUNTER — Encounter: Payer: Self-pay | Admitting: Hematology & Oncology

## 2019-08-22 VITALS — Temp 101.0°F | Wt 315.0 lb

## 2019-08-22 DIAGNOSIS — R509 Fever, unspecified: Secondary | ICD-10-CM

## 2019-08-22 MED ORDER — DIAZEPAM 5 MG PO TABS
5.0000 mg | ORAL_TABLET | Freq: Four times a day (QID) | ORAL | 0 refills | Status: DC | PRN
Start: 2019-08-22 — End: 2020-09-25

## 2019-08-26 ENCOUNTER — Encounter: Payer: Self-pay | Admitting: Hematology & Oncology

## 2019-09-05 ENCOUNTER — Ambulatory Visit
Admission: RE | Admit: 2019-09-05 | Discharge: 2019-09-05 | Disposition: A | Discharge status: Home / Self Care | Payer: BLUE CROSS/BLUE SHIELD | Source: Ambulatory Visit | Attending: Hematology & Oncology | Admitting: Hematology & Oncology

## 2019-09-05 DIAGNOSIS — R590 Localized enlarged lymph nodes: Secondary | ICD-10-CM | POA: Insufficient documentation

## 2019-09-05 DIAGNOSIS — R509 Fever, unspecified: Secondary | ICD-10-CM | POA: Insufficient documentation

## 2019-09-05 MED ORDER — GADOBUTROL 1 MMOL/ML IV SOLN
10.00 mL | Freq: Once | INTRAVENOUS | Status: AC | PRN
Start: 2019-09-05 — End: 2019-09-05
  Administered 2019-09-05: 10 mmol via INTRAVENOUS
  Filled 2019-09-05: qty 10

## 2019-09-09 ENCOUNTER — Encounter: Payer: Self-pay | Admitting: Hematology & Oncology

## 2019-09-17 ENCOUNTER — Telehealth: Payer: Self-pay | Admitting: Hematology & Oncology

## 2019-09-17 NOTE — Telephone Encounter (Signed)
Per Dr. Carney Bern:  The patient can have an appointment on 09/26/2019 at 4PM to review the MRI.   Message sent to the front staff scheduler and the patient.

## 2019-09-25 NOTE — Progress Notes (Unsigned)
HEMATOLOGY VIDEO-CONFERENCE  ENCOUNTER DATE:08/22/2019    Verbal consent has been obtained from the patient to participate in a videoconference visit to minimize exposure to COVID-19. The time spent during this visit discussing medical issues and recommendations was 15 minutes.     HISTORY RELATING TO HEMATOLOGY ISSUES:  Patient is a 38 year old with a one year history of unexplained, low grade fevers, fatigue, malaise, and intermittent tender lymphadenopathy for which she has had extensive evaluation including thorough ID and ENT consultations.  She has not lost weight and hematologic studies have been stably normal.  She was noted to have a moderately high serum ferritin.    BONE MARROW 05/03/2019     DIAGNOSIS:     BONE MARROW, LEFT POSTERIOR ILIAC CREST, CORE BIOPSY, CLOT SECTION,   ASPIRATE SMEAR AND TOUCH PREP:     - NORMOCELLULAR BONE MARROW (approximate50-60%) WITH TRILINEAGE HEMATOPOIESIS,   NORMAL MYELOID TO ERYTHROID RATIO AND ADEQUATE NUMBERS OF   MEGAKARYOCYTES WITH UNREMARKABLE MORPHOLOGY     - A FEW SMALL REACTIVE LYMPHOID AGGREGATES IDENTIFIED, AND MILDLY   INCREASED NUMBERS OF EOSINOPHILS     - PENDING CYTOGENETIC STUDIES OF BONE MARROW SAMPLE - Remain pending as of 05/14/2019.    COMMENT:   The concurrent flow cytometry performed on the bone marrow aspirate   sample shows no evidence of immunophenotypic abnormalities     <Electronically Signed>   RUI HE, MD     INTERVAL NOTES:  05/14/2019 - Remains generally well, but with persistent fevers.  Marrow (as above) did not show evidence for lymphoma or leukemia but there were increased numbers of eosinophils and a few lymphoid aggregates.      06/04/2019 - Discussed marrow findings with Dr. Ilene Qua.  Started trial of prednisone 20 mg/day.  Now, ~2 weeks on steroid, fevers may be slightly reduced, but still present.  She is experiencing some mild agitation, but for the most part is tolerating the drug well.  But still with daily  fever ~101.      07/02/2019 - Remains on prednisone with a brief bump to 40 mg and then back to 20 mg/day.  Fevers have persisted. Transiently less on steroids but recurred to high level when steroid dose reduced.  C/O b/l TMJ discomfort.    07/25/2019 -  Continues with fever, for a few days as high as 103.  No localizing symptoms.  Remains on prednisone 20 mg/day and has had some 'puffiness', particularly hands. This interferes with piano.  Able to work full time (at home).  Overall, no improvement with steroid trial. Last CBC showed an increase wbc to 11.17 with the difference being in an increase in lymphocyte fraction (TLc 4,910/uL).    08/23/2019 - Continues with fevers, occasionally as high as 103.  Feels she is having more right neck and face swelling.  Now off prednisone.       09/26/2019 -     HEMATOLOGY FOCUSED ROS:  General  +fever, +generalized weakness +fatigue.   Heme: No bleeding, bruising, rash, or swelling in the neck, underarms or groin.  No pain or swelling in upper or lower extremities.  All other systems were reviewed and were negative except as indicated in the HPI.      PHYSICAL EXAMINATION:  General: well appearing, well nourished, NAD  HEENT: Anicteric sclera   Neck: no goiter visible  Lymph: no visible cervical / supraclavicular LAD   Resp: breaths even and unlabored, no audible wheezing, speaks full sentences no SOB  CV: no edema no cyanosis  Abd: non-distended  Skin: no visible lesions or rashes  Neuro: alert, oriented, face symmetric. Moves arms freely and symmetrically.  Mental Status: cooperative, appropriate  Psych: appropriate affect, judgement intact      ASSESSMENT:  Fevers, fatigue and malaise. I suspect underlying inflammatory process, perhaps relating to obesity. Modestly elevated ferritin would be consistent with this although low/normal IL-6, not so. In the absence of an elevated TSat, very unlikely an iron overload syndrome at play (e.g., hemochromatosis).  Infectious workup  has been thorough.  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  Mildly elevated eosinophils in the marrow raising concern for possible for underlying allergy. Clinical response to steroid treatment has been minimal.      RECOMMENDATIONS:   Continue OFF prednisone     Will check MRI face/neck - c/w study one year ago.    Will reevaluate in after MRI.

## 2019-09-26 ENCOUNTER — Ambulatory Visit: Payer: BLUE CROSS/BLUE SHIELD | Attending: Hematology & Oncology | Admitting: Hematology & Oncology

## 2019-09-26 ENCOUNTER — Encounter: Payer: Self-pay | Admitting: Hematology & Oncology

## 2019-10-08 ENCOUNTER — Encounter: Payer: Self-pay | Admitting: Hematology & Oncology

## 2019-10-18 ENCOUNTER — Other Ambulatory Visit: Payer: Self-pay

## 2019-10-18 NOTE — Progress Notes (Signed)
error 

## 2019-11-20 ENCOUNTER — Other Ambulatory Visit: Payer: Self-pay

## 2019-11-20 NOTE — Progress Notes (Signed)
ERROR

## 2019-11-21 ENCOUNTER — Other Ambulatory Visit: Payer: Self-pay

## 2019-11-21 DIAGNOSIS — R509 Fever, unspecified: Secondary | ICD-10-CM

## 2019-12-03 ENCOUNTER — Other Ambulatory Visit: Payer: Self-pay | Admitting: Hematology & Oncology

## 2019-12-04 ENCOUNTER — Telehealth: Payer: Self-pay

## 2019-12-04 NOTE — Telephone Encounter (Signed)
Dr. Lorie Apley from [facility] called to discuss pt's PET Scan- relayed information to Dr. Rivka Safer guidance on next steps.

## 2019-12-05 NOTE — Telephone Encounter (Signed)
Per Dr. Carney Bern, next step for patient is MRI of the hand.

## 2019-12-06 ENCOUNTER — Encounter: Payer: Self-pay | Admitting: Hematology & Oncology

## 2019-12-06 DIAGNOSIS — R509 Fever, unspecified: Secondary | ICD-10-CM

## 2019-12-09 ENCOUNTER — Ambulatory Visit (INDEPENDENT_AMBULATORY_CARE_PROVIDER_SITE_OTHER): Payer: Self-pay | Admitting: Obstetrics and Gynecology

## 2019-12-09 NOTE — Progress Notes (Signed)
HEMATOLOGY VIDEO-CONFERENCE  ENCOUNTER DATE:08/22/2019    Verbal consent has been obtained from the patient to participate in a videoconference visit to minimize exposure to COVID-19. The time spent during this visit discussing medical issues and recommendations was 15 minutes.     HISTORY RELATING TO HEMATOLOGY ISSUES:  Patient is a 39 year old with a one year history of unexplained, low grade fevers, fatigue, malaise, and intermittent tender lymphadenopathy for which she has had extensive evaluation including thorough ID and ENT consultations.  She has not lost weight and hematologic studies have been stably normal.  She was noted to have a moderately high serum ferritin.    BONE MARROW 05/03/2019     DIAGNOSIS:     BONE MARROW, LEFT POSTERIOR ILIAC CREST, CORE BIOPSY, CLOT SECTION,   ASPIRATE SMEAR AND TOUCH PREP:     - NORMOCELLULAR BONE MARROW (approximate50-60%) WITH TRILINEAGE HEMATOPOIESIS,   NORMAL MYELOID TO ERYTHROID RATIO AND ADEQUATE NUMBERS OF   MEGAKARYOCYTES WITH UNREMARKABLE MORPHOLOGY     - A FEW SMALL REACTIVE LYMPHOID AGGREGATES IDENTIFIED, AND MILDLY   INCREASED NUMBERS OF EOSINOPHILS     - PENDING CYTOGENETIC STUDIES OF BONE MARROW SAMPLE - Remain pending as of 05/14/2019.    COMMENT:   The concurrent flow cytometry performed on the bone marrow aspirate   sample shows no evidence of immunophenotypic abnormalities     <Electronically Signed>   RUI HE, MD     INTERVAL NOTES:  05/14/2019 - Remains generally well, but with persistent fevers.  Marrow (as above) did not show evidence for lymphoma or leukemia but there were increased numbers of eosinophils and a few lymphoid aggregates.      06/04/2019 - Discussed marrow findings with Dr. Ilene Qua.  Started trial of prednisone 20 mg/day.  Now, ~2 weeks on steroid, fevers may be slightly reduced, but still present.  She is experiencing some mild agitation, but for the most part is tolerating the drug well.  But still with daily  fever ~101.      07/02/2019 - Remains on prednisone with a brief bump to 40 mg and then back to 20 mg/day.  Fevers have persisted. Transiently less on steroids but recurred to high level when steroid dose reduced.  C/O b/l TMJ discomfort.    07/25/2019 -  Continues with fever, for a few days as high as 103.  No localizing symptoms.  Remains on prednisone 20 mg/day and has had some 'puffiness', particularly hands. This interferes with piano.  Able to work full time (at home).  Overall, no improvement with steroid trial. Last CBC showed an increase wbc to 11.17 with the difference being in an increase in lymphocyte fraction (TLc 4,910/uL).    08/23/2019 - Continues with fevers, occasionally as high as 103.  Feels she is having more right neck and face swelling.  Now off prednisone.       12/09/2019 - Continues with fevers.  Off prednisone.  PET/CT obtained 12/03/2019 revealed a hyper metabolic soft tissue mass/fullness at the palmar surface right hand centered at the base of the second digit.  She relates that the clinical course of her fevers began with the removal of a ganglion from the first digit of her right hand and shortly thereafter she began to experience fevers.  She has not noticed any swelling or redness.  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    HEMATOLOGY  FOCUSED ROS:  General  +fever, +generalized weakness +fatigue.   Heme: No bleeding, bruising, rash, or swelling in the neck, underarms or groin.  No pain or swelling in upper or lower extremities.  All other systems were reviewed and were negative except as indicated in the HPI.      PHYSICAL EXAMINATION:  General: well appearing, well nourished, NAD  HEENT: Anicteric sclera   Neck: no goiter visible  Lymph: no visible cervical / supraclavicular LAD   Resp: breaths even and unlabored, no audible wheezing, speaks full sentences no SOB  CV: no edema no  cyanosis  Abd: non-distended  Skin: no visible lesions or rashes  Neuro: alert, oriented, face symmetric. Moves arms freely and symmetrically.  Mental Status: cooperative, appropriate  Psych: appropriate affect, judgement intact      ASSESSMENT:  Fevers, fatigue and malaise. I suspect underlying inflammatory process, perhaps relating to obesity. Modestly elevated ferritin would be consistent with this although low/normal IL-6, not so. In the absence of an elevated TSat, very unlikely an iron overload syndrome at play (e.g., hemochromatosis).  Infectious workup has been thorough.  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  Mildly elevated eosinophils in the marrow raising concern for possible for underlying allergy. Clinical response to steroid treatment has been minimal.      RECOMMENDATIONS:   Continue OFF prednisone     Will check MRI right hand    Will reevaluate in after MRI.

## 2019-12-09 NOTE — Telephone Encounter (Signed)
Pt has a follow up appt with Dr. Carney Bern tomorrow.

## 2019-12-10 ENCOUNTER — Encounter: Payer: Self-pay | Admitting: Hematology & Oncology

## 2019-12-10 ENCOUNTER — Ambulatory Visit: Payer: BLUE CROSS/BLUE SHIELD | Attending: Hematology & Oncology | Admitting: Hematology & Oncology

## 2019-12-10 VITALS — Temp 101.1°F

## 2019-12-10 DIAGNOSIS — M79641 Pain in right hand: Secondary | ICD-10-CM

## 2019-12-10 DIAGNOSIS — R509 Fever, unspecified: Secondary | ICD-10-CM

## 2019-12-11 ENCOUNTER — Encounter: Payer: Self-pay | Admitting: Hematology & Oncology

## 2019-12-13 ENCOUNTER — Ambulatory Visit
Admission: RE | Admit: 2019-12-13 | Discharge: 2019-12-13 | Disposition: A | Discharge status: Home / Self Care | Payer: BLUE CROSS/BLUE SHIELD | Source: Ambulatory Visit | Attending: Hematology & Oncology | Admitting: Hematology & Oncology

## 2019-12-13 DIAGNOSIS — M65831 Other synovitis and tenosynovitis, right forearm: Secondary | ICD-10-CM | POA: Insufficient documentation

## 2019-12-13 DIAGNOSIS — R509 Fever, unspecified: Secondary | ICD-10-CM | POA: Insufficient documentation

## 2019-12-13 DIAGNOSIS — M79641 Pain in right hand: Secondary | ICD-10-CM | POA: Insufficient documentation

## 2019-12-13 DIAGNOSIS — M67441 Ganglion, right hand: Secondary | ICD-10-CM | POA: Insufficient documentation

## 2019-12-13 MED ORDER — GADOBUTROL 1 MMOL/ML IV SOLN
10.00 mL | Freq: Once | INTRAVENOUS | Status: AC | PRN
Start: 2019-12-13 — End: 2019-12-13
  Administered 2019-12-13: 10 mmol via INTRAVENOUS
  Filled 2019-12-13: qty 10

## 2020-01-09 ENCOUNTER — Encounter (INDEPENDENT_AMBULATORY_CARE_PROVIDER_SITE_OTHER): Payer: Self-pay | Admitting: Obstetrics and Gynecology

## 2020-01-09 ENCOUNTER — Ambulatory Visit (INDEPENDENT_AMBULATORY_CARE_PROVIDER_SITE_OTHER): Payer: BLUE CROSS/BLUE SHIELD | Admitting: Obstetrics and Gynecology

## 2020-01-09 VITALS — BP 120/80 | Wt 315.0 lb

## 2020-01-09 DIAGNOSIS — Z01419 Encounter for gynecological examination (general) (routine) without abnormal findings: Secondary | ICD-10-CM

## 2020-01-09 DIAGNOSIS — R509 Fever, unspecified: Secondary | ICD-10-CM

## 2020-01-09 DIAGNOSIS — Z8742 Personal history of other diseases of the female genital tract: Secondary | ICD-10-CM

## 2020-01-09 DIAGNOSIS — Z1231 Encounter for screening mammogram for malignant neoplasm of breast: Secondary | ICD-10-CM

## 2020-01-09 DIAGNOSIS — Z1239 Encounter for other screening for malignant neoplasm of breast: Secondary | ICD-10-CM

## 2020-01-09 DIAGNOSIS — Z1151 Encounter for screening for human papillomavirus (HPV): Secondary | ICD-10-CM

## 2020-01-09 DIAGNOSIS — Z01411 Encounter for gynecological examination (general) (routine) with abnormal findings: Secondary | ICD-10-CM

## 2020-01-09 LAB — POCT UA URISTIX
Glucose, UA POCT: NEGATIVE mg/dL
POCT Protein, UA: NEGATIVE mg/dL
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
## 2020-01-10 ENCOUNTER — Encounter (INDEPENDENT_AMBULATORY_CARE_PROVIDER_SITE_OTHER): Payer: Self-pay

## 2020-01-10 LAB — ESTRADIOL: Estradiol: 34.9 pg/mL

## 2020-01-10 LAB — FOLLICLE STIMULATING HORMONE: Follicle Stimulating Hormone: 8.2 m[IU]/mL

## 2020-01-10 NOTE — Progress Notes (Signed)
FSH and estradiol not c/w menopause, result released

## 2020-01-15 NOTE — Progress Notes (Signed)
HEMATOLOGY VIDEO-CONFERENCE  ENCOUNTER DATE:01/16/2020    Verbal consent has been obtained from the patient to participate in a videoconference visit to minimize exposure to COVID-19. The time spent during this visit discussing medical issues and recommendations was 15 minutes.     HISTORY RELATING TO HEMATOLOGY ISSUES:  Patient is a 39 year old with a one year history of unexplained, low grade fevers, fatigue, malaise, and intermittent tender lymphadenopathy for which she has had extensive evaluation including thorough ID and ENT consultations.  She has not lost weight and hematologic studies have been stably normal.  She was noted to have a moderately high serum ferritin.    BONE MARROW 05/03/2019    BONE MARROW, LEFT POSTERIOR ILIAC CREST, CORE BIOPSY, CLOT SECTION,   ASPIRATE SMEAR AND TOUCH PREP:     - NORMOCELLULAR BONE MARROW (approximate50-60%) WITH TRILINEAGE HEMATOPOIESIS, NORMAL MYELOID TO ERYTHROID RATIO AND ADEQUATE NUMBERS OF   MEGAKARYOCYTES WITH UNREMARKABLE MORPHOLOGY     - A FEW SMALL REACTIVE LYMPHOID AGGREGATES IDENTIFIED, AND MILDLY INCREASED NUMBERS OF EOSINOPHILS     - PENDING CYTOGENETIC STUDIES OF BONE MARROW SAMPLE - Remain pending as of 05/14/2019.    COMMENT:   The concurrent flow cytometry performed on the bone marrow aspirate sample shows no evidence of immunophenotypic abnormalities     <Electronically Signed>   RUI HE, MD     INTERVAL NOTES:  05/14/2019 - Remains generally well, but with persistent fevers.  Marrow (as above) did not show evidence for lymphoma or leukemia but there were increased numbers of eosinophils and a few lymphoid aggregates.      06/04/2019 - Discussed marrow findings with Dr. Ilene Qua.  Started trial of prednisone 20 mg/day.  Now, ~2 weeks on steroid, fevers may be slightly reduced, but still present.  She is experiencing some mild agitation, but for the most part is tolerating the drug well.  But still with daily fever ~101.      07/02/2019 -  Remains on prednisone with a brief bump to 40 mg and then back to 20 mg/day.  Fevers have persisted. Transiently less on steroids but recurred to high level when steroid dose reduced.  C/O b/l TMJ discomfort.    07/25/2019 -  Continues with fever, for a few days as high as 103.  No localizing symptoms.  Remains on prednisone 20 mg/day and has had some 'puffiness', particularly hands. This interferes with piano.  Able to work full time (at home).  Overall, no improvement with steroid trial. Last CBC showed an increase wbc to 11.17 with the difference being in an increase in lymphocyte fraction (TLc 4,910/uL).    08/23/2019 - Continues with fevers, occasionally as high as 103.  Feels she is having more right neck and face swelling.  Now off prednisone.       12/09/2019 - Continues with fevers.  Off prednisone.  PET/CT obtained 12/03/2019 revealed a hyper metabolic soft tissue mass/fullness at the palmar surface right hand centered at the base of the second digit.  She relates that the clinical course of her fevers began with the removal of a ganglion from the first digit of her right hand and shortly thereafter she began to experience fevers.  She has not noticed any swelling or redness.  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    01/16/2020-Hailey [name] remains much the same with periodic spiking fevers but without localizing signs.  The fevers continue with spikes up to 102 degrees.  For the most part, most of the days she is quite okay, without fever.  She will be receiving the Covid vaccine tomorrow and then again in 3 weeks.    HEMATOLOGY FOCUSED ROS:  General  +fever, +generalized weakness +fatigue.   Heme: No bleeding, bruising, rash, or swelling in the neck, underarms or groin.  No pain or swelling in upper or lower extremities.  All other systems were reviewed and were negative except as indicated in the  HPI.      PHYSICAL EXAMINATION:  General: well appearing, well nourished, NAD  HEENT: Anicteric sclera   Neck: no goiter visible  Lymph: no visible cervical / supraclavicular LAD   Resp: breaths even and unlabored, no audible wheezing, speaks full sentences no SOB  CV: no edema no cyanosis  Abd: non-distended  Skin: no visible lesions or rashes  Neuro: alert, oriented, face symmetric. Moves arms freely and symmetrically.  Mental Status: cooperative, appropriate  Psych: appropriate affect, judgement intact      ASSESSMENT:  Fevers, fatigue and malaise. I suspect underlying inflammatory process, perhaps relating to obesity. Modestly elevated ferritin would be consistent with this although low/normal IL-6, not so. In the absence of an elevated TSat, very unlikely an iron overload syndrome at play (e.g., hemochromatosis).  Infectious workup has been thorough.  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  Mildly elevated eosinophils in the marrow raising concern for possible for underlying allergy. Clinical response to steroid treatment has been minimal.      MRI of right hand (which had lit up on recent PET/CT) --    No suspicious soft tissue mass, osseous mass, or masslike enhancement in the right hand or wrist to correlate with the diffuse FDG uptake on recent PET/CT.   There is mild enhancing synovitis in the right wrist joint without active joint erosion or bony destruction.   A small dorsal ganglion cyst overlying the scapholunate interval.    RECOMMENDATIONS:   Continue OFF prednisone     Patient will be referred to rheumatology for their assessment.   We have held as a contingency a referral to the [facility] in [place] for a complete and independent reassessment.

## 2020-01-16 ENCOUNTER — Ambulatory Visit: Payer: BLUE CROSS/BLUE SHIELD | Attending: Hematology & Oncology | Admitting: Hematology & Oncology

## 2020-01-16 ENCOUNTER — Encounter: Payer: Self-pay | Admitting: Hematology & Oncology

## 2020-01-16 ENCOUNTER — Other Ambulatory Visit (INDEPENDENT_AMBULATORY_CARE_PROVIDER_SITE_OTHER): Payer: Self-pay | Admitting: Physician Assistant

## 2020-01-16 DIAGNOSIS — J301 Allergic rhinitis due to pollen: Secondary | ICD-10-CM

## 2020-01-16 DIAGNOSIS — R509 Fever, unspecified: Secondary | ICD-10-CM

## 2020-01-16 DIAGNOSIS — R5381 Other malaise: Secondary | ICD-10-CM

## 2020-01-16 DIAGNOSIS — R5383 Other fatigue: Secondary | ICD-10-CM

## 2020-01-16 DIAGNOSIS — K811 Chronic cholecystitis: Secondary | ICD-10-CM

## 2020-01-16 LAB — IMAGE GUIDED PAP WITH APTIMA HPV TP
.: 0
HPV APTIMA: NEGATIVE

## 2020-01-16 NOTE — Progress Notes (Signed)
Nl  Pap, result released

## 2020-01-17 NOTE — Telephone Encounter (Signed)
Needs OV or VV; temp okay

## 2020-01-17 NOTE — Telephone Encounter (Signed)
LMTCB for VV

## 2020-01-21 ENCOUNTER — Other Ambulatory Visit: Payer: Self-pay | Admitting: Obstetrics and Gynecology

## 2020-01-21 ENCOUNTER — Encounter (INDEPENDENT_AMBULATORY_CARE_PROVIDER_SITE_OTHER): Payer: Self-pay | Admitting: Obstetrics and Gynecology

## 2020-01-21 NOTE — Progress Notes (Signed)
sono shows resolving CL cyst of L ovary, as well as a Bartholin's cyst 4.2 x 29 x 4.1cm in size.  Result released and detailed message sent to pt.

## 2020-01-22 ENCOUNTER — Telehealth (INDEPENDENT_AMBULATORY_CARE_PROVIDER_SITE_OTHER): Payer: Self-pay

## 2020-01-22 ENCOUNTER — Encounter (INDEPENDENT_AMBULATORY_CARE_PROVIDER_SITE_OTHER): Payer: Self-pay | Admitting: Physician Assistant

## 2020-01-22 ENCOUNTER — Other Ambulatory Visit: Payer: Self-pay | Admitting: Obstetrics and Gynecology

## 2020-01-22 ENCOUNTER — Encounter (INDEPENDENT_AMBULATORY_CARE_PROVIDER_SITE_OTHER): Payer: Self-pay | Admitting: Obstetrics and Gynecology

## 2020-01-22 DIAGNOSIS — N644 Mastodynia: Secondary | ICD-10-CM

## 2020-01-22 NOTE — Telephone Encounter (Signed)
[facility] calling states pt is there for her mammogram, but c/o left breast pain so they would like diagnostic mammogram order. Order entered in Epic and faxed as per request.

## 2020-01-23 NOTE — Progress Notes (Signed)
Mammogram and ultrasound normal.  Result released

## 2020-01-24 ENCOUNTER — Encounter (INDEPENDENT_AMBULATORY_CARE_PROVIDER_SITE_OTHER): Payer: Self-pay | Admitting: Physician Assistant

## 2020-01-24 ENCOUNTER — Telehealth (INDEPENDENT_AMBULATORY_CARE_PROVIDER_SITE_OTHER): Payer: BLUE CROSS/BLUE SHIELD | Admitting: Physician Assistant

## 2020-01-24 DIAGNOSIS — Z91018 Allergy to other foods: Secondary | ICD-10-CM

## 2020-01-24 DIAGNOSIS — K811 Chronic cholecystitis: Secondary | ICD-10-CM

## 2020-01-24 DIAGNOSIS — Z6841 Body Mass Index (BMI) 40.0 and over, adult: Secondary | ICD-10-CM | POA: Insufficient documentation

## 2020-01-24 DIAGNOSIS — G43109 Migraine with aura, not intractable, without status migrainosus: Secondary | ICD-10-CM

## 2020-01-24 DIAGNOSIS — R509 Fever, unspecified: Secondary | ICD-10-CM

## 2020-01-24 DIAGNOSIS — B001 Herpesviral vesicular dermatitis: Secondary | ICD-10-CM

## 2020-01-24 DIAGNOSIS — J3089 Other allergic rhinitis: Secondary | ICD-10-CM

## 2020-01-24 MED ORDER — RIZATRIPTAN BENZOATE 10 MG PO TABS
10.0000 mg | ORAL_TABLET | Freq: Once | ORAL | 3 refills | Status: DC | PRN
Start: 2020-01-24 — End: 2020-12-22

## 2020-01-24 MED ORDER — EPIPEN 2-PAK 0.3 MG/0.3ML IJ SOAJ
0.30 mg | Freq: Once | INTRAMUSCULAR | 1 refills | Status: DC | PRN
Start: 2020-01-24 — End: 2020-12-22

## 2020-01-24 MED ORDER — VALACYCLOVIR HCL 1 G PO TABS
ORAL_TABLET | ORAL | 3 refills | Status: DC
Start: 2020-01-24 — End: 2020-12-22

## 2020-01-24 MED ORDER — COLESEVELAM HCL 625 MG PO TABS
625.0000 mg | ORAL_TABLET | Freq: Two times a day (BID) | ORAL | 3 refills | Status: DC
Start: 2020-01-24 — End: 2020-12-22

## 2020-01-24 MED ORDER — HYDROXYZINE HCL 10 MG PO TABS
10.0000 mg | ORAL_TABLET | Freq: Every evening | ORAL | 3 refills | Status: DC
Start: 2020-01-24 — End: 2020-12-22

## 2020-01-24 NOTE — Progress Notes (Signed)
[facility]                       Date of Virtual Visit: 01/24/2020 2:00 PM        Patient ID: Hailey [name] is a 39 y.o. female.  Attending Physician: Tyler Aas, PA       Telemedicine Eligibility:      Do you give [place] permission to submit this claim to your insurance and/or bill you for any charges not paid for insurance:    [x]  Yes   []  No    State Location:    [x]  [place]  []  [place]  []  [address] [place] []  [place]    []  Other (specify):    Patient Identity Verification:    [x]  State Issued ID  []  DOB / photo ID  []  Other (specify):           Chief Complaint:    Medication Refill (hydoxyzine, welchol and epi pen)               HPI:    Due to current pandemic of COVID 19, patient being seen through a virtual video visit to minimize infectious disease risk to themselves and our medical office staff.    Pt calling in today for refills.   Reviewed med list.  No issues with meds.      1.  welchol bid for control of n/v from chronic cholecytitis following cholecystectomy; works well.  Tried stopping but unsuccessful  2.  Hydroxyzine for allergies every night; works better than  n[place]row[place] alternatives  3.  maxalt for migraines.  Works well.  At least 2 a month, sometimes a few more  4.  Epi pen for fruit allergy  never had to use it.  Over 29 years old  5.  Valtrex tabs for cold sores.  Works well.  Doesn't need often    Also, continues to ses hematologist for FUO sx.  Being sent to rheum for evaluation          Problem List:    Patient Active Problem List   Diagnosis    Chronic cholecystitis    Morbid obesity    Herpes labialis    Non-seasonal allergic rhinitis, unspecified trigger    Migraine with aura and without status migrainosus, not intractable    Fever, unknown origin    BMI 45.0-49.9, adult             Current Meds:    Current Outpatient Medications   Medication Sig Dispense Refill    colesevelam (WELCHOL) 625 MG tablet Take 1 tablet  (625 mg total) by mouth 2 (two) times daily with meals 180 tablet 3    hydrOXYzine (ATARAX) 10 MG tablet Take 1 tablet (10 mg total) by mouth nightly 90 tablet 3    rizatriptan (MAXALT) 10 MG tablet Take 1 tablet (10 mg total) by mouth once as needed for Migraine May repeat in 2 hours if needed 36 tablet 3    valacyclovir (VALTREX) 1000 MG tablet Take 2 tabs at onset; repeat once in 12 hours.  Total 4 tabs per outbreak 12 tablet 3    diazePAM (Valium) 5 MG tablet Take 1 tablet (5 mg total) by mouth every 6 (six) hours as needed for Anxiety 10 tablet 0    EPINEPHrine (EPIPEN 2-PAK) 0.3 MG/0.3ML Solution Auto-injector injection Inject 0.3 mLs (0.3 mg total) into the muscle once as needed (Anaphylaxis) ;  go to ER after injection 1 each 1    Multiple Vitamins-Minerals (MULTIVITAMIN ADULT PO) Take by mouth daily          No current facility-administered medications for this visit.          Allergies:    Allergies   Allergen Reactions    Adhesive [Wound Dressing Adhesive]      dermabond    Latex Other (See Comments)     Swelling and redness throughout body     Other      Tropical fruits like coconuts, pineapple, kiwi, etc.    Oxycodone Other (See Comments)     Hallucinations             Past Surgical History:    Past Surgical History:   Procedure Laterality Date    ablasion  2005    BIOPSY, LYMPH NODE N/A 07/23/2018    Procedure: BIOPSY, LYMPH NODE;  Surgeon: Shelda Pal, MD;  Location: [phone] [facility];  Service: ENT;  Laterality: N/A;  NECK DEEP LYMPH NODE BIOPSY    CHOLECYSTECTOMY  2009    D&C DIAGNOSTIC  2010    DILATION AND CURETTAGE OF UTERUS  2010    FINGER GANGLION CYST EXCISION  04/2018    GANGLION CYST EXCISION  2014    04/2018 cyst removed from index finger-[facility]    HERNIA REPAIR  2016    HYSTERECTOMY  2012    PELVIC LAPAROSCOPY  2011    UMBILICAL HERNIA REPAIR  2016    UPPER GASTROINTESTINAL ENDOSCOPY  2009    WISDOM TOOTH EXTRACTION  1998           Family History:     Family History   Adopted: Yes           Social History:    Social History     Tobacco Use    Smoking status: Never Smoker    Smokeless tobacco: Never Used   Substance Use Topics    Alcohol use: Not Currently     Alcohol/week: 0.0 standard drinks     Comment: rarely    Drug use: Never          The following sections were reviewed this encounter by the provider:   Tobacco   Allergies   Meds   Problems   Med Hx   Surg Hx   Fam Hx              Vital Signs:    There were no vitals taken for this visit.         ROS:    Review of Systems          Physical Exam:      GENERAL APPEARANCE: alert, in no acute distress, pleasant, well nourished.   HEAD: normal appearance  EYES: no discharge  EARS: normal hearing  NECK/THYROID: appearance -supple  PSYCH: appropriate affect, appropriate mood, normal speech, normal attention          Assessment:    1. Chronic cholecystitis  - colesevelam (WELCHOL) 625 MG tablet; Take 1 tablet (625 mg total) by mouth 2 (two) times daily with meals  Dispense: 180 tablet; Refill: 3    2. Morbid obesity    3. BMI 45.0-49.9, adult    4. Fever, unknown origin    5. Migraine with aura and without status migrainosus, not intractable  - rizatriptan (MAXALT) 10 MG tablet; Take 1 tablet (10 mg total) by mouth once as needed for  Migraine May repeat in 2 hours if needed  Dispense: 36 tablet; Refill: 3    6. Non-seasonal allergic rhinitis, unspecified trigger  - hydrOXYzine (ATARAX) 10 MG tablet; Take 1 tablet (10 mg total) by mouth nightly  Dispense: 90 tablet; Refill: 3    7. Herpes labialis  - valacyclovir (VALTREX) 1000 MG tablet; Take 2 tabs at onset; repeat once in 12 hours.  Total 4 tabs per outbreak  Dispense: 12 tablet; Refill: 3    8. H/O food allergy  - EPINEPHrine (EPIPEN 2-PAK) 0.3 MG/0.3ML Solution Auto-injector injection; Inject 0.3 mLs (0.3 mg total) into the muscle once as needed (Anaphylaxis) ; go to ER after injection  Dispense: 1 each; Refill: 1            Plan:    F/u specialists as  instructed            Follow-up:    Return in about 1 year (around 01/23/2021) for ccv.         Tyler Aas, PA

## 2020-01-27 ENCOUNTER — Ambulatory Visit (INDEPENDENT_AMBULATORY_CARE_PROVIDER_SITE_OTHER): Payer: BLUE CROSS/BLUE SHIELD | Admitting: Obstetrics and Gynecology

## 2020-01-27 ENCOUNTER — Encounter (INDEPENDENT_AMBULATORY_CARE_PROVIDER_SITE_OTHER): Payer: Self-pay | Admitting: Obstetrics and Gynecology

## 2020-01-27 VITALS — BP 120/74 | Ht 68.0 in | Wt 315.0 lb

## 2020-01-27 DIAGNOSIS — R509 Fever, unspecified: Secondary | ICD-10-CM

## 2020-01-27 DIAGNOSIS — N898 Other specified noninflammatory disorders of vagina: Secondary | ICD-10-CM

## 2020-01-27 NOTE — Progress Notes (Signed)
S: Pt presents today for repeat exam due to possible Bartholin's cyst seen on pelvic sono, 4 x 2 x 4cm in size.  Laterality not specified.  She is also being followed by multiple specialists for FUO and may be evaluated at [facility] soon.  Her cyst was not visualized on her well woman visit, nor was it seen on a PET/CT scan.    O: BP 120/74  Gen: NAD  Pelvic: NEFG.  Thorough exam of vulva and vagina reveals no obvious cyst or mass.  Patient without any significant tenderness on exam.      A/P: Vaginal cyst on ultrasound - not appreciable on exam.  -Reviewed that with a neg PET/CT and a benign exam that I am doubtful this finding would be associated with her FUO.  I would not surgically intervene given it is not appreciable on exam.  Did offer MRI for better characterization - she would like to proceed.    -Discussed that if her specialists feel this needs to be addressed then I would refer her to a surgical specialist (likely gyn onc) for eval.

## 2020-01-28 ENCOUNTER — Encounter (INDEPENDENT_AMBULATORY_CARE_PROVIDER_SITE_OTHER): Payer: Self-pay | Admitting: Obstetrics & Gynecology

## 2020-02-03 ENCOUNTER — Ambulatory Visit
Admission: RE | Admit: 2020-02-03 | Discharge: 2020-02-03 | Disposition: A | Discharge status: Home / Self Care | Payer: BLUE CROSS/BLUE SHIELD | Source: Ambulatory Visit | Attending: Obstetrics and Gynecology | Admitting: Obstetrics and Gynecology

## 2020-02-03 DIAGNOSIS — N898 Other specified noninflammatory disorders of vagina: Secondary | ICD-10-CM | POA: Insufficient documentation

## 2020-02-03 DIAGNOSIS — R509 Fever, unspecified: Secondary | ICD-10-CM | POA: Insufficient documentation

## 2020-02-03 MED ORDER — GADOBUTROL 1 MMOL/ML IV SOLN
10.00 mL | Freq: Once | INTRAVENOUS | Status: AC | PRN
Start: 2020-02-03 — End: 2020-02-03
  Administered 2020-02-03: 10 mmol via INTRAVENOUS
  Filled 2020-02-03: qty 10

## 2020-02-04 ENCOUNTER — Encounter (INDEPENDENT_AMBULATORY_CARE_PROVIDER_SITE_OTHER): Payer: Self-pay | Admitting: Obstetrics and Gynecology

## 2020-02-04 NOTE — Progress Notes (Signed)
Detailed message sent to pt re: MRI

## 2020-02-06 ENCOUNTER — Ambulatory Visit (INDEPENDENT_AMBULATORY_CARE_PROVIDER_SITE_OTHER): Payer: Self-pay

## 2020-02-06 ENCOUNTER — Other Ambulatory Visit (FREE_STANDING_LABORATORY_FACILITY): Payer: BLUE CROSS/BLUE SHIELD

## 2020-02-06 ENCOUNTER — Telehealth (INDEPENDENT_AMBULATORY_CARE_PROVIDER_SITE_OTHER): Payer: BLUE CROSS/BLUE SHIELD | Admitting: Rheumatology

## 2020-02-06 ENCOUNTER — Encounter (INDEPENDENT_AMBULATORY_CARE_PROVIDER_SITE_OTHER): Payer: Self-pay | Admitting: Rheumatology

## 2020-02-06 DIAGNOSIS — M2550 Pain in unspecified joint: Secondary | ICD-10-CM

## 2020-02-06 DIAGNOSIS — R509 Fever, unspecified: Secondary | ICD-10-CM

## 2020-02-06 DIAGNOSIS — R5382 Chronic fatigue, unspecified: Secondary | ICD-10-CM

## 2020-02-06 DIAGNOSIS — R682 Dry mouth, unspecified: Secondary | ICD-10-CM

## 2020-02-06 DIAGNOSIS — R591 Generalized enlarged lymph nodes: Secondary | ICD-10-CM

## 2020-02-06 LAB — SEDIMENTATION RATE: Sed Rate: 11 mm/Hr (ref 0–20)

## 2020-02-06 LAB — CBC
Absolute NRBC: 0 10*3/uL (ref 0.00–0.00)
Hematocrit: 42.4 % (ref 34.7–43.7)
Hgb: 13.9 g/dL (ref 11.4–14.8)
MCH: 29.8 pg (ref 25.1–33.5)
MCHC: 32.8 g/dL (ref 31.5–35.8)
MCV: 91 fL (ref 78.0–96.0)
MPV: 8.5 fL — ABNORMAL LOW (ref 8.9–12.5)
Nucleated RBC: 0 /100 WBC (ref 0.0–0.0)
Platelets: 395 10*3/uL — ABNORMAL HIGH (ref 142–346)
RBC: 4.66 10*6/uL (ref 3.90–5.10)
RDW: 13 % (ref 11–15)
WBC: 9.08 10*3/uL (ref 3.10–9.50)

## 2020-02-06 LAB — VITAMIN D,25 OH,TOTAL: Vitamin D, 25 OH, Total: 31 ng/mL (ref 30–100)

## 2020-02-06 LAB — CK: Creatine Kinase (CK): 116 U/L (ref 29–168)

## 2020-02-06 LAB — COMPREHENSIVE METABOLIC PANEL
ALT: 63 U/L — ABNORMAL HIGH (ref 0–55)
AST (SGOT): 46 U/L — ABNORMAL HIGH (ref 5–34)
Albumin/Globulin Ratio: 1.1 (ref 0.9–2.2)
Albumin: 4.2 g/dL (ref 3.5–5.0)
Alkaline Phosphatase: 80 U/L (ref 37–106)
Anion Gap: 12 (ref 5.0–15.0)
BUN: 8 mg/dL (ref 7.0–19.0)
Bilirubin, Total: 0.5 mg/dL (ref 0.1–1.2)
CO2: 24 mEq/L (ref 21–29)
Calcium: 9.3 mg/dL (ref 8.5–10.5)
Chloride: 104 mEq/L (ref 100–111)
Creatinine: 0.8 mg/dL (ref 0.4–1.5)
Globulin: 3.7 g/dL (ref 2.0–3.7)
Glucose: 97 mg/dL (ref 70–100)
Potassium: 4.2 mEq/L (ref 3.5–5.1)
Protein, Total: 7.9 g/dL (ref 6.0–8.3)
Sodium: 140 mEq/L (ref 136–145)

## 2020-02-06 LAB — C4 COMPLEMENT: C4 Complement: 32.5 mg/dL (ref 15.0–57.0)

## 2020-02-06 LAB — C3 COMPLEMENT: C3 Complement: 193 mg/dL (ref 83–193)

## 2020-02-06 LAB — C-REACTIVE PROTEIN: C-Reactive Protein: 0.2 mg/dL (ref 0.0–0.8)

## 2020-02-06 LAB — GFR: EGFR: >60

## 2020-02-06 LAB — HEMOLYSIS INDEX: Hemolysis Index: 6 (ref 0–18)

## 2020-02-06 LAB — TSH: TSH: 2.41 u[IU]/mL (ref 0.35–4.94)

## 2020-02-06 LAB — RHEUMATOID FACTOR: Rheumatoid Factor: <15 (ref 0.0–30.0)

## 2020-02-06 NOTE — Patient Instructions (Signed)
1. Unclear etiology of your symptoms  2. Will explore potential causes like sjogrens or vasuclitis  3. Obtain labs (ordered via [facility] so do not need to print order slips)  4. F/up 2 weeks after labs done to discuss results and next step

## 2020-02-06 NOTE — Telephone Encounter (Signed)
Nurse assessment completed.

## 2020-02-06 NOTE — Progress Notes (Signed)
Initial Rheumatology Consultation    Chief Complaint:     Chief Complaint   Patient presents with    Fever     100.4 F, x3-4 month, ongoing 1.5 yrs, Referred by Dr. Carney Bern      Telemedicine Video Documentation Requirements    Originating site (Patient location): home  Distant site (Provider location): office  Provider and Title: Dr. Alita Chyle  Consent obtained: YES/NO: Yes  Language, if applicable and if translator was required: English     HPI:   This patient is a 39 y.o. year old female referred by PCP/Dr. Carney Bern for 1.5 years of unexplained low grade fevers, fatigue, malaise, intermittent tender LAD. She has been evaluated by ID and hematology.  She has been seeing Dr. Carney Bern with neg work up thus far.    Jun 2019-ganglion cyst removed from RIF.   Developed salivary gland infection treated with abx, but developed ongoing fevers since then. Saw ENT first who then referred to ID with neg work up.    9/019-Underwent exsicional LN biopsy-reactive LN.  Eventually referred to hematology.     04/2019: underwent a bone marrow biopsy which was notable for increased number of eosinophils and few lymphoid aggregates.     05/2019-08/2019: Underwent a 3 month prednisone rx trial with persistent fevers. Fevers as high as 103    12/2019: PET/CT hyper metabolic soft tissue mass/fulness of plamar surface R hand. 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 MRI with some R wrist synovitis noted.       Current symptoms:  -recurrent fevers: >100 all day/everyday, relatively lower in midday  -joint pain: constant but worse, R>L hand (relates to being pianist), can get puffiness in hands-chronic. Pain mostly over mcps and CMC joints,  b/l knee pain, back pain-initially from scoliosis. Used to be more active.  -nausea-intermittent, worse with higher temps  -weeks of canker sores x 1year  -skin-itching over back, +dry skin    no malar rash, no raynauds, no alopecia but thinning of hair. +Dry mouth/dec saliva,  had u/s of salivary gland which was normal. +Dry eyes.         PMSH:     Past Medical History:   Diagnosis Date    Abnormal Pap smear of cervix     Anemia ~12    Pre-hysterectomy    BMI 45.0-49.9, adult     Breast lump     Diarrhea     occasional    Fever     for past ~ 3 months (avg daily fever 100-101)    Gastroesophageal reflux disease     Headache     Herpes simplex virus (HSV) infection     Meningitis     Post-operative nausea and vomiting     Scoliosis     Swollen lymph nodes        Past Surgical History:   Procedure Laterality Date    ablasion  2005    BIOPSY, LYMPH NODE N/A 07/23/2018    Procedure: BIOPSY, LYMPH NODE;  Surgeon: Shelda Pal, MD;  Location: [facility];  Service: ENT;  Laterality: N/A;  NECK DEEP LYMPH NODE BIOPSY    BONE MARROW BIOPSY  2020    CHOLECYSTECTOMY  2009    D&C DIAGNOSTIC  2010    DILATION AND CURETTAGE OF UTERUS  2010    FINGER GANGLION CYST EXCISION  04/2018    GANGLION CYST EXCISION  2014    04/2018 cyst removed from index  finger-[facility]    HERNIA REPAIR  2016    HYSTERECTOMY  2012    PELVIC LAPAROSCOPY  2011    UMBILICAL HERNIA REPAIR  2016    UPPER GASTROINTESTINAL ENDOSCOPY  2009    WISDOM TOOTH EXTRACTION  1998       Allergies:     Allergies   Allergen Reactions    Adhesive [Wound Dressing Adhesive]      dermabond    Latex Other (See Comments)     Swelling and redness throughout body     Other      Tropical fruits like coconuts, pineapple, kiwi, etc.    Oxycodone Other (See Comments)     Hallucinations       Meds:     Current Outpatient Medications:     colesevelam (WELCHOL) 625 MG tablet, Take 1 tablet (625 mg total) by mouth 2 (two) times daily with meals, Disp: 180 tablet, Rfl: 3    diazePAM (Valium) 5 MG tablet, Take 1 tablet (5 mg total) by mouth every 6 (six) hours as needed for Anxiety, Disp: 10 tablet, Rfl: 0    fexofenadine (ALLEGRA) 180 MG tablet, Take 180 mg by mouth daily, Disp: , Rfl:     hydrOXYzine  (ATARAX) 10 MG tablet, Take 1 tablet (10 mg total) by mouth nightly, Disp: 90 tablet, Rfl: 3    Multiple Vitamins-Minerals (MULTIVITAMIN ADULT PO), Take by mouth daily  , Disp: , Rfl:     rizatriptan (MAXALT) 10 MG tablet, Take 1 tablet (10 mg total) by mouth once as needed for Migraine May repeat in 2 hours if needed, Disp: 36 tablet, Rfl: 3    valacyclovir (VALTREX) 1000 MG tablet, Take 2 tabs at onset; repeat once in 12 hours.  Total 4 tabs per outbreak, Disp: 12 tablet, Rfl: 3    EPINEPHrine (EPIPEN 2-PAK) 0.3 MG/0.3ML Solution Auto-injector injection, Inject 0.3 mLs (0.3 mg total) into the muscle once as needed (Anaphylaxis) ; go to ER after injection, Disp: 1 each, Rfl: 1    FH:     Family History   Adopted: Yes       SH:     Social History     Socioeconomic History    Marital status: Married     Spouse name: Not on file    Number of children: Not on file    Years of education: Not on file    Highest education level: Not on file   Occupational History    Not on file   Tobacco Use    Smoking status: Never Smoker    Smokeless tobacco: Never Used   Substance and Sexual Activity    Alcohol use: Not Currently     Alcohol/week: 0.0 standard drinks     Comment: rarely    Drug use: Never    Sexual activity: Yes     Partners: Male     Birth control/protection: None     Comment: Hysterectomy   Other Topics Concern    Not on file   Social History Narrative    Not on file     Social Determinants of Health     Financial Resource Strain:     Difficulty of Paying Living Expenses:    Food Insecurity:     Worried About Programme researcher, broadcasting/film/video in the Last Year:     Barista in the Last Year:    Transportation Needs:     Freight forwarder (Medical):  Lack of Transportation (Non-Medical):    Physical Activity:     Days of Exercise per Week:     Minutes of Exercise per Session:    Stress:     Feeling of Stress :    Social Connections:     Frequency of Communication with Friends and Family:      Frequency of Social Gatherings with Friends and Family:     Attends Religious Services:     Active Member of Clubs or Organizations:     Attends Engineer, structural:     Marital Status:    Intimate Partner Violence:     Fear of Current or Ex-Partner:     Emotionally Abused:     Physically Abused:     Sexually Abused:        ROS:   All other systems reviewed and negative except as described above.        PHYSICAL EXAM:   There were no vitals filed for this visit.    General appearance - alert, well appearing, and in no distress  Mental status - alert, oriented to person, place, and time  Eyes - sclera anicteric  Chest - breathing normally, no respiratory distress, able to complete full sentences  Neurological - alert, oriented, normal speech, no focal findings or movement disorder noted  Extremities -  no extremity edema, no clubbing or cyanosis  Skin - no facial rash, no skin changes over hands. No nail changes noted   MSK- self palpation of: +ttp over L wrist and L 3rd MCP and PIP, L 2nd PIP, +ttp over R wrist, R 2nd, 3rd MCP, R 2-3 PIP and 5th PIP-no swelling.       Labs:     Component      Latest Ref Rng & Units 07/03/2019 07/26/2019   WBC      3.10 - 9.50 x10 3/uL 11.17 (H) 13.26 (H)   Hemoglobin      11.4 - 14.8 g/dL 16.1 09.6   Hematocrit      34.7 - 43.7 % 42.9 42.8   Platelet Count      142 - 346 x10 3/uL 391 (H) 392 (H)   RBC      3.90 - 5.10 x10 6/uL 4.67 4.69   MCV      78.0 - 96.0 fL 91.9 91.3   MCH      25.1 - 33.5 pg 29.8 30.1   MCHC      31.5 - 35.8 g/dL 04.5 40.9   RDW      11 - 15 % 13 14   MPV      8.9 - 12.5 fL 8.1 (L) 8.3 (L)   Nucleated RBC      0.0 - 0.0 /100 WBC 0.0 0.0   Nucleated RBC Absolute      0.00 - 0.00 x10 3/uL 0.00 0.00   Neutrophils      None % 49 59   Band Neutrophils      None % 0 0   Lymphocytes Manual      None % 44 34   Monocytes Manual      None % 6 2   Eosinophils Manual      None % 1 0   Basophils Manual      None % 0 0   Atypical Lymphocytes %      None %  5    Neutrophils Absolute Manual      1.10 - 6.33  x10 3/uL 5.47 7.82 (H)   Band Neutrophils Absolute      0.00 - 1.00 x10 3/uL 0.00 0.00   Lymphocytes Absolute Manual      0.42 - 3.22 x10 3/uL 4.91 (H) 4.51 (H)   Monocytes Absolute      0.21 - 0.85 x10 3/uL 0.67 0.27   Eosinophils Absolute Manual      0.00 - 0.44 x10 3/uL 0.11 0.00   Basophils Absolute Manual      0.00 - 0.08 x10 3/uL 0.00 0.00   Atypical Lymphocytes Absolute      0.00 - 0.00 x10 3/uL  0.66 (H)   Cell Morphology       Normal Normal   Platelet Estimate       Increased (A) Increased (A)   Platelet Clumps       Present (A) Present (A)   Glucose      70 - 100 mg/dL 90 88   BUN      7.0 - 19.0 mg/dL 57.8 46.9   Creatinine      0.4 - 1.5 mg/dL 0.9 0.8   Sodium      629 - 145 mEq/L 140 139   Potassium      3.5 - 5.1 mEq/L 4.7 3.7   Chloride      100 - 111 mEq/L 105 104   CO2      21 - 29 mEq/L 25 23   Calcium      8.5 - 10.5 mg/dL 9.6 9.6   Protein Total      6.0 - 8.3 g/dL 7.3 7.3   Albumin      3.5 - 5.0 g/dL 4.0 4.1   AST      5 - 34 U/L 24 23   ALT      0 - 55 U/L 56 (H) 50   Alkaline Phosphatase      37 - 106 U/L 58 63   Bilirubin Total      0.1 - 1.2 mg/dL 0.4 0.4   Globulin      2.0 - 3.7 g/dL 3.3 3.2   Albumin/Globulin Ratio      0.9 - 2.2 1.2 1.3   Anion Gap      5.0 - 15.0 10.0 12.0   Clinical Information        NOT PROVIDED   Specimen Type        SEE BELOW   Viability      %  97   Interpretation        SEE BELOW   Sample Description:        SEE BELOW   Gating Strategy        SEE BELOW   Markers        SEE BELOW   Number of Markers:        22   EBV VCA Ab, IgM      0.0 - 35.9 U/mL  <36.0   EBV VCA Ab, IgG      0.0 - 17.9 U/mL  >600.0 (H)   Epstein-Barr virus, Nuclear AG AB      0.0 - 17.9 U/mL  330.0 (H)   EBV Interp.        Comment   EGFR       >60.0 >60.0   Sed Rate      0 - 20 mm/Hr  9   LDH      125 - 331 U/L  234   CMV  AB, IgG      See Below  <0.20   CMV Ab, IgM      See Below  <8.00   ANA Screen Reflex        Negative   Ferritin      4.60 -  204.00 ng/mL  176.64   Hemolysis Index      0 - 18  7         Radiology:   MRI R hand:  IMPRESSION:     1.  No suspicious soft tissue mass, osseous mass, or masslike  enhancement in the right hand or wrist to correlate with the diffuse FDG  uptake on recent PET/CT.  2.  There is mild enhancing synovitis in the right wrist joint without  active joint erosion or bony destruction.  3.  A small dorsal ganglion cyst overlying the scapholunate interval.    Electronically signed by: Candi Leash M.D.  [Interpreted at: 01]  [facility]    QV: 12/14/19    MRI orbit/face/neck:  IMPRESSION:      1. No recurrent mass lesion is identified in the right submandibular  operative bed.  2. A submental lymph node on the left has mildly increased in  size/conspicuity compared to the prior examination. Prominent cervical  lymph nodes elsewhere are similar to the prior examination.      Electronically signed by: Nicoletta Dress M.D.  [Interpreted at: 41BCF]  [facility]    JT: 09/06/19    ASSESSMENT:   Hailey [name] is a 39 y.o. female with PMH of GERD now with 1 year of unexplained low grade fevers, intermittent LAD, fatigue of unclear etiology.     Fevers increased with prednisone trial.  Bone marrow bx with inc eosinophils.   WBC slightly increased      PLAN:   1.FUO  -daily fevers  -arthralgias  -+sicca symptoms  -unclear etiology -ddx includes sjogrens, egpa esp with inc eos, or auto-inflam disorders/perioidic fever syndromes  -unusual no response to steroids, neg ANA 07/2019 ([facility])  -check labs: ANA IFA, ssa/ssb, c3, c4, esr, crp, cbc, tsh, vitD,   APLS panel, RF/CCP    2. Arthralgias    3. Intermittent LAD  -reactive LN biopsy  -bone marrow biopsy -inc eosinophils    4. FUP in ~2 weeks to discuss above labs/next step    Time spent on direct and indirect patient care: 55 minutes; of which greater than 50% of this time was spent in counseling and coordination of care

## 2020-02-07 LAB — SJOGRENS SYNDROME-A EXTRACTABLE NUCLEAR ANTIBODY(SOFT)
SSA Interpretation: NEGATIVE
Sjogrens SSA (Ro) Antibody: 22 (ref 0–99)

## 2020-02-07 LAB — ANGIOTENSIN CONVERTING ENZYME: Angiotensin-Converting Enzyme: 27 U/L (ref 16–85)

## 2020-02-09 LAB — CARDIOLIPIN ANTIBODY, IGG: Cardiolipin IgG Antibody: <14 (ref <=14)

## 2020-02-09 LAB — CARDIOLIPIN ANTIBODY, IGM: Cardiolipin IgM Antibody: 14 — ABNORMAL HIGH (ref <=12)

## 2020-02-10 LAB — BETA-2 GLYCOPROTEIN ANTIBODIES
Beta-2-Glycoprotein 1 Antibody, IgG: <9.4
Beta-2-Glycoprotein 1 Antibody, IgM: <9.4

## 2020-02-10 LAB — ANCA PANEL FOR VASCULITIS, S
Myeloperoxidase AB: <0.2 U
Proteinase 3 Antibody: <0.2 U

## 2020-02-10 LAB — LUPUS ANTICOAGULANT
PTT-LA Screen: 37 s (ref <=40)
dRVVT Screen: 37 s (ref <=45)

## 2020-02-10 LAB — CYCLIC CITRUL PEPTIDE ANTIBODY, IGG: Cyclic Citrullinated Peptide AB: <15.6 U

## 2020-02-10 LAB — CARDIOLIPIN ANTIBODY, IGA: Cardiolipin IgA Antibody: <9.4

## 2020-02-27 ENCOUNTER — Telehealth (INDEPENDENT_AMBULATORY_CARE_PROVIDER_SITE_OTHER): Payer: BLUE CROSS/BLUE SHIELD | Admitting: Rheumatology

## 2020-02-27 ENCOUNTER — Encounter (INDEPENDENT_AMBULATORY_CARE_PROVIDER_SITE_OTHER): Payer: Self-pay | Admitting: Rheumatology

## 2020-02-27 ENCOUNTER — Encounter: Payer: Self-pay | Admitting: Hematology & Oncology

## 2020-02-27 DIAGNOSIS — R7401 Elevation of levels of liver transaminase levels: Secondary | ICD-10-CM

## 2020-02-27 DIAGNOSIS — R509 Fever, unspecified: Secondary | ICD-10-CM

## 2020-02-27 DIAGNOSIS — R591 Generalized enlarged lymph nodes: Secondary | ICD-10-CM

## 2020-02-27 DIAGNOSIS — M2550 Pain in unspecified joint: Secondary | ICD-10-CM

## 2020-02-27 NOTE — Patient Instructions (Signed)
1. The rheumatologic work up at this time is negative  2. We explored vasculitis, sjogrens especially given some of the symptoms/findings, but all markers were normal  3. Other possibilities include adult stills disease and sarcoid, but given normal ferritin (for adult stills) and neg LN and bone marrow biopsy (for sarcoid) this seems less likely and especially since fevers worsened with steroids which is treatment for both of these conditions.  4. Consider pursuing liver biopsy  5. Agree with plan for [facility]  6. Please return if needed

## 2020-02-27 NOTE — Progress Notes (Signed)
Rheumatology follow up    Chief Complaint:     Chief Complaint   Patient presents with    Fever    Follow-up     Telemedicine Video Documentation Requirements    Originating site (Patient location): home  Distant site (Provider location): office  Provider and Title: Dr. Alita Chyle  Consent obtained: YES/NO: Yes  Language, if applicable and if translator was required: English   HPI:   This patient is a 39 y.o. year old female who returns for fup of FUO to discuss results.     Current symptoms:   No change  Ongoing fevers      PMSH:     Past Medical History:   Diagnosis Date    Abnormal Pap smear of cervix     Anemia ~12    Pre-hysterectomy    BMI 45.0-49.9, adult     Breast lump     Diarrhea     occasional    Fever     for past ~ 3 months (avg daily fever 100-101)    Gastroesophageal reflux disease     Headache     Herpes simplex virus (HSV) infection     Meningitis     Post-operative nausea and vomiting     Scoliosis     Swollen lymph nodes          Allergies:     Allergies   Allergen Reactions    Adhesive [Wound Dressing Adhesive]      dermabond    Latex Other (See Comments)     Swelling and redness throughout body     Other      Tropical fruits like coconuts, pineapple, kiwi, etc.    Oxycodone Other (See Comments)     Hallucinations       Meds:     Current Outpatient Medications   Medication Sig Dispense Refill    colesevelam (WELCHOL) 625 MG tablet Take 1 tablet (625 mg total) by mouth 2 (two) times daily with meals 180 tablet 3    diazePAM (Valium) 5 MG tablet Take 1 tablet (5 mg total) by mouth every 6 (six) hours as needed for Anxiety 10 tablet 0    EPINEPHrine (EPIPEN 2-PAK) 0.3 MG/0.3ML Solution Auto-injector injection Inject 0.3 mLs (0.3 mg total) into the muscle once as needed (Anaphylaxis) ; go to ER after injection 1 each 1    fexofenadine (ALLEGRA) 180 MG tablet Take 180 mg by mouth daily      hydrOXYzine (ATARAX) 10 MG tablet Take 1 tablet (10 mg total) by mouth nightly 90  tablet 3    Multiple Vitamins-Minerals (MULTIVITAMIN ADULT PO) Take by mouth daily         rizatriptan (MAXALT) 10 MG tablet Take 1 tablet (10 mg total) by mouth once as needed for Migraine May repeat in 2 hours if needed 36 tablet 3    valacyclovir (VALTREX) 1000 MG tablet Take 2 tabs at onset; repeat once in 12 hours.  Total 4 tabs per outbreak 12 tablet 3     No current facility-administered medications for this visit.       ROS:   All other systems reviewed and negative except as described above.        PHYSICAL EXAM:   There were no vitals filed for this visit.    General appearance - alert, well appearing, and in no distress  Mental status - alert, oriented to person, place, and time  Eyes - sclera anicteric  Chest -  breathing normally, no respiratory distress, able to complete full sentences  Neurological - alert, oriented, normal speech, no focal findings or movement disorder noted  Extremities -  no extremity edema, no clubbing or cyanosis  Skin - no facial rash          Labs:     Component      Latest Ref Rng & Units 02/06/2020   Glucose      70 - 100 mg/dL 97   BUN      7.0 - 16.1 mg/dL 8.0   Creatinine      0.4 - 1.5 mg/dL 0.8   Sodium      096 - 145 mEq/L 140   Potassium      3.5 - 5.1 mEq/L 4.2   Chloride      100 - 111 mEq/L 104   CO2      21 - 29 mEq/L 24   Calcium      8.5 - 10.5 mg/dL 9.3   Protein Total      6.0 - 8.3 g/dL 7.9   Albumin      3.5 - 5.0 g/dL 4.2   AST      5 - 34 U/L 46 (H)   ALT      0 - 55 U/L 63 (H)   Alkaline Phosphatase      37 - 106 U/L 80   Bilirubin Total      0.1 - 1.2 mg/dL 0.5   Globulin      2.0 - 3.7 g/dL 3.7   Albumin/Globulin Ratio      0.9 - 2.2 1.1   Anion Gap      5.0 - 15.0 12.0   WBC      3.10 - 9.50 x10 3/uL 9.08   Hemoglobin      11.4 - 14.8 g/dL 04.5   Hematocrit      34.7 - 43.7 % 42.4   Platelet Count      142 - 346 x10 3/uL 395 (H)   RBC      3.90 - 5.10 x10 6/uL 4.66   MCV      78.0 - 96.0 fL 91.0   MCH      25.1 - 33.5 pg 29.8   MCHC      31.5 - 35.8  g/dL 40.9   RDW      11 - 15 % 13   MPV      8.9 - 12.5 fL 8.5 (L)   Nucleated RBC      0.0 - 0.0 /100 WBC 0.0   Nucleated RBC Absolute      0.00 - 0.00 x10 3/uL 0.00   Lupus Anticoagulant       SEE BELOW   dRVVT Mix Interpretation       Not Indicated   dRVVT Screen      <=45 sec 37   dRVVT       SEE BELOW   PTT-LA Screen      <=40 sec 37   Hexagonal Phase Confirmation       Not indicated   Additional Testing       Not indicated   Beta2 GP1 AB IGG       <9.4   BETA2 GP1 AB IGM       <9.4   SSA      0 - 99 22   SSA Interp       Negative   Myeloperoxidase AB  U <0.2   Proteinase 3 Antibody      U <0.2   C-Reactive Protein      0.0 - 0.8 mg/dL 0.2   C3 Complement      83 - 193 mg/dL 161   C4 Complement      15.0 - 57.0 mg/dL 09.6   Creatine Kinase (CK)      29 - 168 U/L 116   Sed Rate      0 - 20 mm/Hr 11   TSH      0.35 - 4.94 uIU/mL 2.41   Vitamin D 25-OH Total      30 - 100 ng/mL 31   Rheumatoid Factor      0.0 - 30.0 <15.0   Cyclic Citrullinated Peptide Ab, S      U <15.6   Angiotensin-Converting Enzyme      16 - 85 U/L 27   Cardiolipin IgA Antibody       <9.4   Cardiolipin IgG Antibody      <=14 <14   Cardiolipin IgM Antibody      <=12 14 (H)   Hemolysis Index      0 - 18 6   EGFR       >60.0         Radiology:   n/a      ASSESSMENT:   Hailey [name] is a 39 y.o. female with PMH of GERD now with 1 year of unexplained low grade fevers, intermittent LAD, fatigue of unclear etiology.     Fevers increased with prednisone trial.  Bone marrow bx with inc eosinophils.   WBC slightly increased    PLAN:   1.FUO  -daily fevers  -arthralgias  -+sicca symptoms  -unclear etiology -ddx includes sjogrens, egpa esp with inc eos,-but unlikley with negative markers. Other ddx includes auto-inflam disorders/perioidic fever syndromes or sarcoidosis, IgG4RD   -some features to suggest adult stills disease (fevers, LAD, elevated liver enzymes) but some to go against it (worse with steroids, nml ferritin).  -sarcoid less  likely with nml LN and bone marrow biopsy, but can cause LAD and liver ds.   -unusual no response to steroids, neg ANA 07/2019 (elisa)  -NML labs: ANA IFA, ssa/ssb, c3, c4, esr, crp, cbc, tsh, vitD,   APLS panel, RF/CCP unremarkable  -ferritin nml    2. Arthralgias    3. Intermittent LAD  -reactive LN biopsy  -bone marrow biopsy -inc eosinophils    4. transaminitis  -consider liver biopsy    -agree with Dr. Forest Becker plan for possible evaluation at [facility] or consider [facility] (more local).  Would also consider liver biopsy.     Pt to return as needed.     Time spent on direct and indirect patient care: 20 minutes; of which greater than 50% of this time was spent in counseling and coordination of care

## 2020-04-01 ENCOUNTER — Encounter: Payer: Self-pay | Admitting: Hematology & Oncology

## 2020-04-09 ENCOUNTER — Encounter: Payer: Self-pay | Admitting: Hematology & Oncology

## 2020-04-09 ENCOUNTER — Ambulatory Visit: Payer: BLUE CROSS/BLUE SHIELD | Attending: Hematology & Oncology | Admitting: Hematology & Oncology

## 2020-04-09 DIAGNOSIS — R509 Fever, unspecified: Secondary | ICD-10-CM

## 2020-04-09 DIAGNOSIS — R5381 Other malaise: Secondary | ICD-10-CM

## 2020-04-09 DIAGNOSIS — R5383 Other fatigue: Secondary | ICD-10-CM

## 2020-04-09 NOTE — Progress Notes (Signed)
HEMATOLOGY VIDEO-CONFERENCE  ENCOUNTER DATE:04/07/2020    Verbal consent has been obtained from the patient to participate in a videoconference visit to minimize exposure to COVID-19. The time spent during this visit discussing medical issues and recommendations was 15 minutes.     HISTORY RELATING TO HEMATOLOGY ISSUES:  Patient is a 39 year old with a one year history of unexplained, low grade fevers, fatigue, malaise, and intermittent tender lymphadenopathy for which she has had extensive evaluation including thorough ID and ENT consultations.  She has not lost weight and hematologic studies have been stably normal.  She was noted to have a moderately high serum ferritin.    BONE MARROW 05/03/2019    BONE MARROW, LEFT POSTERIOR ILIAC CREST, CORE BIOPSY, CLOT SECTION,   ASPIRATE SMEAR AND TOUCH PREP:     - NORMOCELLULAR BONE MARROW (approximate50-60%) WITH TRILINEAGE HEMATOPOIESIS, NORMAL MYELOID TO ERYTHROID RATIO AND ADEQUATE NUMBERS OF   MEGAKARYOCYTES WITH UNREMARKABLE MORPHOLOGY     - A FEW SMALL REACTIVE LYMPHOID AGGREGATES IDENTIFIED, AND MILDLY INCREASED NUMBERS OF EOSINOPHILS     - PENDING CYTOGENETIC STUDIES OF BONE MARROW SAMPLE - Remain pending as of 05/14/2019.    COMMENT:   The concurrent flow cytometry performed on the bone marrow aspirate sample shows no evidence of immunophenotypic abnormalities     <Electronically Signed>   RUI HE, MD     INTERVAL NOTES:  05/14/2019 - Remains generally well, but with persistent fevers.  Marrow (as above) did not show evidence for lymphoma or leukemia but there were increased numbers of eosinophils and a few lymphoid aggregates.      06/04/2019 - Discussed marrow findings with Dr. Ilene Qua.  Started trial of prednisone 20 mg/day.  Now, ~2 weeks on steroid, fevers may be slightly reduced, but still present.  She is experiencing some mild agitation, but for the most part is tolerating the drug well.  But still with daily fever ~101.      07/02/2019 -  Remains on prednisone with a brief bump to 40 mg and then back to 20 mg/day.  Fevers have persisted. Transiently less on steroids but recurred to high level when steroid dose reduced.  C/O b/l TMJ discomfort.    07/25/2019 -  Continues with fever, for a few days as high as 103.  No localizing symptoms.  Remains on prednisone 20 mg/day and has had some 'puffiness', particularly hands. This interferes with piano.  Able to work full time (at home).  Overall, no improvement with steroid trial. Last CBC showed an increase wbc to 11.17 with the difference being in an increase in lymphocyte fraction (TLc 4,910/uL).    08/23/2019 - Continues with fevers, occasionally as high as 103.  Feels she is having more right neck and face swelling.  Now off prednisone.       12/09/2019 - Continues with fevers.  Off prednisone.  PET/CT obtained 12/03/2019 revealed a hyper metabolic soft tissue mass/fullness at the palmar surface right hand centered at the base of the second digit.  She relates that the clinical course of her fevers began with the removal of a ganglion from the first digit of her right hand and shortly thereafter she began to experience fevers.  She has not noticed any swelling or redness.  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    01/16/2020-Hailey [name] remains much the same with periodic spiking fevers but without localizing signs.  The fevers continue with spikes up to 102 degrees.  For the most part, most of the days she is quite okay, without fever.  She will be receiving the Covid vaccine tomorrow and then again in 3 weeks.    04/09/2020 - She continues with fevers sometimes as high as 103.  Patient was to be seen at the [facility] but upon the review of preliminary information it was decided that the visit not be conducted.  She was seen by Dr. Pollyann Savoy (rheumatology).  No autoimmune disease was suspected based upon  laboratory studies and physical exam.  However, she did note the low level but persistent liver function test abnormality and raised concern for possible sarcoid.  Liver bx may be in order.      HEMATOLOGY FOCUSED ROS:  General  +fever, +generalized weakness +fatigue.   Heme: No bleeding, bruising, rash, or swelling in the neck, underarms or groin.  No pain or swelling in upper or lower extremities.  All other systems were reviewed and were negative except as indicated in the HPI.      PHYSICAL EXAMINATION:  General: well appearing, well nourished, NAD  HEENT: Anicteric sclera   Neck: no goiter visible  Lymph: no visible cervical / supraclavicular LAD   Resp: breaths even and unlabored, no audible wheezing, speaks full sentences no SOB  CV: no edema no cyanosis  Abd: non-distended  Skin: no visible lesions or rashes  Neuro: alert, oriented, face symmetric. Moves arms freely and symmetrically.  Mental Status: cooperative, appropriate  Psych: appropriate affect, judgement intact      ASSESSMENT:  Fevers, fatigue and malaise. Now two years without explanation.  I suspect underlying inflammatory process, perhaps relating to obesity. Modestly elevated ferritin would be consistent with this although low/normal IL-6, not so. In the absence of an elevated TSat, very unlikely an iron overload syndrome  (e.g., hemochromatosis).  Infectious workup has been thorough.  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  Mildly elevated eosinophils in the marrow raising concern for possible for underlying allergy. Clinical response to steroid treatment has been minimal.      MRI of right hand (which had lit up on recent PET/CT) --    No suspicious soft tissue mass, osseous mass, or masslike enhancement in the right hand or wrist to correlate with the diffuse FDG uptake on recent PET/CT.   There is mild enhancing synovitis in the right wrist joint  without active joint erosion or bony destruction.   A small dorsal ganglion cyst overlying the scapholunate interval.    RECOMMENDATIONS:   Continue OFF prednisone     Consider liver biopsy.   Review correspondence from [facility].   D/W ID once again.   RTC 6 weeks.

## 2020-05-06 NOTE — Progress Notes (Signed)
HEMATOLOGY VIDEO-CONFERENCE  ENCOUNTER DATE:05/07/2020    Verbal consent has been obtained from the patient to participate in a videoconference visit to minimize exposure to COVID-19. The time spent during this visit discussing medical issues and recommendations was 15 minutes.     HISTORY RELATING TO HEMATOLOGY ISSUES:  Patient is a 39 year old with a one year history of unexplained, low grade fevers, fatigue, malaise, and intermittent tender lymphadenopathy for which she has had extensive evaluation including thorough ID and ENT consultations.  She has not lost weight and hematologic studies have been stably normal.  She was noted to have a moderately high serum ferritin.    BONE MARROW 05/03/2019    BONE MARROW, LEFT POSTERIOR ILIAC CREST, CORE BIOPSY, CLOT SECTION,   ASPIRATE SMEAR AND TOUCH PREP:     - NORMOCELLULAR BONE MARROW (approximate50-60%) WITH TRILINEAGE HEMATOPOIESIS, NORMAL MYELOID TO ERYTHROID RATIO AND ADEQUATE NUMBERS OF   MEGAKARYOCYTES WITH UNREMARKABLE MORPHOLOGY     - A FEW SMALL REACTIVE LYMPHOID AGGREGATES IDENTIFIED, AND MILDLY INCREASED NUMBERS OF EOSINOPHILS     - PENDING CYTOGENETIC STUDIES OF BONE MARROW SAMPLE - Remain pending as of 05/14/2019.    COMMENT:   The concurrent flow cytometry performed on the bone marrow aspirate sample shows no evidence of immunophenotypic abnormalities     <Electronically Signed>   RUI HE, MD     INTERVAL NOTES:  05/14/2019 - Remains generally well, but with persistent fevers.  Marrow (as above) did not show evidence for lymphoma or leukemia but there were increased numbers of eosinophils and a few lymphoid aggregates.      06/04/2019 - Discussed marrow findings with Dr. Ilene Qua.  Started trial of prednisone 20 mg/day.  Now, ~2 weeks on steroid, fevers may be slightly reduced, but still present.  She is experiencing some mild agitation, but for the most part is tolerating the drug well.  But still with daily fever ~101.      07/02/2019 -  Remains on prednisone with a brief bump to 40 mg and then back to 20 mg/day.  Fevers have persisted. Transiently less on steroids but recurred to high level when steroid dose reduced.  C/O b/l TMJ discomfort.    07/25/2019 -  Continues with fever, for a few days as high as 103.  No localizing symptoms.  Remains on prednisone 20 mg/day and has had some 'puffiness', particularly hands. This interferes with piano.  Able to work full time (at home).  Overall, no improvement with steroid trial. Last CBC showed an increase wbc to 11.17 with the difference being in an increase in lymphocyte fraction (TLc 4,910/uL).    08/23/2019 - Continues with fevers, occasionally as high as 103.  Feels she is having more right neck and face swelling.  Now off prednisone.       12/09/2019 - Continues with fevers.  Off prednisone.  PET/CT obtained 12/03/2019 revealed a hyper metabolic soft tissue mass/fullness at the palmar surface right hand centered at the base of the second digit.  She relates that the clinical course of her fevers began with the removal of a ganglion from the first digit of her right hand and shortly thereafter she began to experience fevers.  She has not noticed any swelling or redness.  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    01/16/2020-Hailey [name] remains much the same with periodic spiking fevers but without localizing signs.  The fevers continue with spikes up to 102 degrees.  For the most part, most of the days she is quite okay, without fever.  She will be receiving the Covid vaccine tomorrow and then again in 3 weeks.     02/03/2020 - MRI of right hand (which had lit up on recent PET/CT) --    No suspicious soft tissue mass, osseous mass, or masslike enhancement in the right hand or wrist to correlate with the diffuse FDG uptake on recent PET/CT.   There is mild enhancing synovitis in the right wrist joint  without active joint erosion or bony destruction.   A small dorsal ganglion cyst overlying the scapholunate interval.    04/09/2020 - She continues with fevers sometimes as high as 103.  Patient was to be seen at the [facility] but upon the review of preliminary information it was decided that the visit not be conducted.  She was seen by Dr. Pollyann Savoy (rheumatology).  No autoimmune disease was suspected based upon laboratory studies and physical exam.  However, she did note the low level but persistent liver function test abnormality and raised concern for possible sarcoid.  Liver bx may be in order.      05/07/2020 - Continues with fevers almost every day, up to ~102 F.  For the most part devoid of other constitutional symptoms other than fatigue when with fever.  No change in appetite or associated weight loss.  She is becoming frustrated, and (I sense) depressed over inability to find the cause of fever.      HEMATOLOGY FOCUSED ROS:  General  +fever, +generalized weakness +fatigue.   Heme: No bleeding, bruising, rash, or swelling in the neck, underarms or groin.  No pain or swelling in upper or lower extremities.  All other systems were reviewed and were negative except as indicated in the HPI.      PHYSICAL EXAMINATION:  General: well appearing, well nourished, NAD  HEENT: Anicteric sclera   Neck: no goiter visible  Lymph: no visible cervical / supraclavicular LAD   Resp: breaths even and unlabored, no audible wheezing, speaks full sentences no SOB  CV: no edema no cyanosis  Abd: non-distended  Skin: no visible lesions or rashes  Neuro: alert, oriented, face symmetric. Moves arms freely and symmetrically.  Mental Status: cooperative, appropriate  Psych: appropriate affect, judgement intact      ASSESSMENT:  Fevers, fatigue and malaise. Now two years without explanation.  I suspect an underlying inflammatory process, perhaps relating to obesity. Modestly elevated ferritin would be consistent with this although  low/normal IL-6, not so. In the absence of an elevated TSat, very unlikely an iron overload syndrome  (e.g., hemochromatosis).  Infectious workup has been thorough.  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  Mildly elevated eosinophils in the marrow raising concern for possible for underlying allergy. Clinical response to steroid treatment has been minimal and she is now off steroid.      RECOMMENDATIONS:   Continue OFF prednisone     Consider liver biopsy.   Referral to FUO specialist at [facility].   D/W ID once again.   RTC 6 weeks.

## 2020-05-07 ENCOUNTER — Ambulatory Visit: Payer: BLUE CROSS/BLUE SHIELD | Attending: Hematology & Oncology | Admitting: Hematology & Oncology

## 2020-05-07 DIAGNOSIS — R509 Fever, unspecified: Secondary | ICD-10-CM

## 2020-05-11 ENCOUNTER — Encounter: Payer: Self-pay | Admitting: Hematology & Oncology

## 2020-05-18 ENCOUNTER — Other Ambulatory Visit: Payer: BLUE CROSS/BLUE SHIELD

## 2020-05-18 DIAGNOSIS — R509 Fever, unspecified: Secondary | ICD-10-CM

## 2020-05-19 LAB — CBC WITH MANUAL DIFFERENTIAL
Baso(Absolute): 0.1 10*3/uL (ref 0.0–0.2)
Basophils: 1 %
Eosinophils Absolute: 0.2 10*3/uL (ref 0.0–0.4)
Eosinophils: 2 %
Hematocrit: 44.6 % (ref 34.0–46.6)
Hemoglobin: 14.8 g/dL (ref 11.1–15.9)
Lymphocytes %: 40 %
Lymphs (Absolute): 3.2 10*3/uL — ABNORMAL HIGH (ref 0.7–3.1)
MCH: 31 pg (ref 26.6–33.0)
MCHC: 33.2 g/dL (ref 31.5–35.7)
MCV: 94 fL (ref 79–97)
Monocytes Absolute: 0.5 10*3/uL (ref 0.1–0.9)
Monocytes: 6 %
Neutrophils Absolute: 4 10*3/uL (ref 1.4–7.0)
Neutrophils: 51 %
Platelets: 402 10*3/uL (ref 150–450)
Plt Comment: ADEQUATE
RBC Comment: NORMAL
RBC: 4.77 x10E6/uL (ref 3.77–5.28)
RDW: 12.4 % (ref 11.7–15.4)
WBC: 7.9 10*3/uL (ref 3.4–10.8)

## 2020-05-19 LAB — COMPREHENSIVE METABOLIC PANEL
ALT: 70 IU/L — ABNORMAL HIGH (ref 0–32)
AST (SGOT): 59 IU/L — ABNORMAL HIGH (ref 0–40)
African American eGFR: 117 mL/min/{1.73_m2} (ref >59)
Albumin/Globulin Ratio: 1.5 (ref 1.2–2.2)
Albumin: 4.4 g/dL (ref 3.8–4.8)
Alkaline Phosphatase: 87 IU/L (ref 48–121)
BUN / Creatinine Ratio: 15 (ref 9–23)
BUN: 11 mg/dL (ref 6–24)
Bilirubin, Total: 0.4 mg/dL (ref 0.0–1.2)
CO2: 17 mmol/L — ABNORMAL LOW (ref 20–29)
Calcium: 9.8 mg/dL (ref 8.7–10.2)
Chloride: 101 mmol/L (ref 96–106)
Creatinine: 0.74 mg/dL (ref 0.57–1.00)
Globulin, Total: 3 g/dL (ref 1.5–4.5)
Glucose: 113 mg/dL — ABNORMAL HIGH (ref 65–99)
Potassium: 4.7 mmol/L (ref 3.5–5.2)
Protein, Total: 7.4 g/dL (ref 6.0–8.5)
Sodium: 139 mmol/L (ref 134–144)
non-African American eGFR: 102 mL/min/{1.73_m2} (ref >59)

## 2020-05-19 LAB — FERRITIN: Ferritin: 291 ng/mL — ABNORMAL HIGH (ref 15–150)

## 2020-06-12 ENCOUNTER — Other Ambulatory Visit: Payer: Self-pay

## 2020-06-12 NOTE — Progress Notes (Signed)
Error

## 2020-06-23 ENCOUNTER — Other Ambulatory Visit: Payer: Self-pay

## 2020-06-23 NOTE — Progress Notes (Signed)
Error

## 2020-07-05 ENCOUNTER — Ambulatory Visit (INDEPENDENT_AMBULATORY_CARE_PROVIDER_SITE_OTHER): Payer: BLUE CROSS/BLUE SHIELD

## 2020-07-05 DIAGNOSIS — Z23 Encounter for immunization: Secondary | ICD-10-CM

## 2020-07-24 ENCOUNTER — Other Ambulatory Visit: Payer: Self-pay | Admitting: Internal Medicine

## 2020-07-24 ENCOUNTER — Ambulatory Visit
Admission: RE | Admit: 2020-07-24 | Discharge: 2020-07-24 | Disposition: A | Discharge status: Home / Self Care | Payer: BLUE CROSS/BLUE SHIELD | Source: Ambulatory Visit | Attending: Internal Medicine | Admitting: Internal Medicine

## 2020-07-24 DIAGNOSIS — R748 Abnormal levels of other serum enzymes: Secondary | ICD-10-CM

## 2020-07-24 DIAGNOSIS — R509 Fever, unspecified: Secondary | ICD-10-CM

## 2020-07-24 MED ORDER — GADOBUTROL 1 MMOL/ML IV SOLN
10.00 mL | Freq: Once | INTRAVENOUS | Status: AC | PRN
Start: 2020-07-24 — End: 2020-07-24
  Administered 2020-07-24: 10 mmol via INTRAVENOUS
  Filled 2020-07-24: qty 10

## 2020-08-13 ENCOUNTER — Other Ambulatory Visit: Payer: Self-pay | Admitting: Internal Medicine

## 2020-08-13 ENCOUNTER — Ambulatory Visit
Admission: RE | Admit: 2020-08-13 | Discharge: 2020-08-13 | Disposition: A | Discharge status: Home / Self Care | Payer: BLUE CROSS/BLUE SHIELD | Source: Ambulatory Visit | Attending: Internal Medicine | Admitting: Internal Medicine

## 2020-08-13 DIAGNOSIS — R509 Fever, unspecified: Secondary | ICD-10-CM

## 2020-08-13 MED ORDER — GADOBUTROL 1 MMOL/ML IV SOLN
10.0000 mL | Freq: Once | INTRAVENOUS | Status: AC | PRN
Start: 2020-08-13 — End: 2020-08-13
  Administered 2020-08-13: 10 mmol via INTRAVENOUS
  Filled 2020-08-13: qty 10

## 2020-08-14 ENCOUNTER — Other Ambulatory Visit: Payer: Self-pay | Admitting: Internal Medicine

## 2020-08-14 DIAGNOSIS — R509 Fever, unspecified: Secondary | ICD-10-CM

## 2020-08-19 ENCOUNTER — Encounter (INDEPENDENT_AMBULATORY_CARE_PROVIDER_SITE_OTHER): Payer: Self-pay | Admitting: Physician Assistant

## 2020-09-25 ENCOUNTER — Telehealth (INDEPENDENT_AMBULATORY_CARE_PROVIDER_SITE_OTHER): Payer: BLUE CROSS/BLUE SHIELD | Admitting: Internal Medicine

## 2020-09-25 ENCOUNTER — Encounter (INDEPENDENT_AMBULATORY_CARE_PROVIDER_SITE_OTHER): Payer: Self-pay | Admitting: Internal Medicine

## 2020-09-25 DIAGNOSIS — Z8679 Personal history of other diseases of the circulatory system: Secondary | ICD-10-CM | POA: Insufficient documentation

## 2020-09-25 DIAGNOSIS — M419 Scoliosis, unspecified: Secondary | ICD-10-CM | POA: Insufficient documentation

## 2020-09-25 DIAGNOSIS — R509 Fever, unspecified: Secondary | ICD-10-CM

## 2020-09-25 DIAGNOSIS — K915 Postcholecystectomy syndrome: Secondary | ICD-10-CM

## 2020-09-25 DIAGNOSIS — G43109 Migraine with aura, not intractable, without status migrainosus: Secondary | ICD-10-CM

## 2020-09-25 DIAGNOSIS — M2550 Pain in unspecified joint: Secondary | ICD-10-CM

## 2020-09-25 NOTE — Assessment & Plan Note (Signed)
Patient has had extensive workup so far unrevealing for her fevers, joint pains, fatigue  Reviewed consults from   ID at [facility]- Dr Claudette Laws  Heme Dr Carney Bern  Rheum Dr Pollyann Savoy   (needs new outpatient rheum, suggested Dr Adin Hector)  She is scheduled for hepatology consult and consideration of liver biopsy next

## 2020-09-25 NOTE — Assessment & Plan Note (Signed)
Maxalt is helpful for migraines PRN

## 2020-09-25 NOTE — Assessment & Plan Note (Signed)
S/p ablation 2005, no cardiac symptoms

## 2020-09-25 NOTE — Progress Notes (Signed)
[facility] INTERNAL MEDICINE - AN [place] PARTNER                 Date of Virtual Visit: 09/25/2020 2:18 PM        Patient ID: Hailey [name] is a 39 y.o. female.  Attending Physician: Lurlean Horns, MD       Telemedicine Eligibility:    State Location:  [x]  [place]  []  [place]  []  [place] []  [place]  []  Other:    Personal identity shared with patient:  [x]  Yes  []  No    Education on nature of video visit shared with patient:  [x]  Yes  []  No    Verbal consent has been obtained from the patient to conduct a video and telephone visit to minimize exposure to COVID-19:   [x]  Yes  []  No    Language:  English . Translator was required:   []  Yes  [x]  No    Visit terminated since not appropriate for virtual care:  [x]  N/A  []  Reason:               Chief Complaint:    Chief Complaint   Patient presents with    Fever     x June, neck pain, joints pain              HPI:    New patient to establish PCP    She has been suffering from unexplained fevers, joint symptoms, fatigue, weakness for over 2 years. Preceding this she had a ganglion cyst removal procedure and shortly thereafter had a salivary gland infection that required multiple antibiotics. She continues have intermittent swelling of submental/submandibular lymph nodes, one has been biopsied and was OK.   She also still has waxing and waning of salivary gland inflammation under the tongue.    She has had workup with [facility]um ([facility] [place]), heme ([facility]), ID ([facility])  Initially she was tried on several courses of antibiotics and also prednisone and none have been helpful  Next plan per ID is seeing GI/Hepatology Dr Trinidad Curet  Imaging has revealed fatty liver, LFTS are mildly elevated    Other notable imaging findings  Cystic lesion at vaginal cuff, prior hysterectomy  Right hand- hypermetabolic palmar area on PET, MRI was without masses, but wrist has synovitis without erosion          Problem List:    Patient Active Problem List    Diagnosis    Morbid obesity    Herpes labialis    Non-seasonal allergic rhinitis, unspecified trigger    Migraine with aura and without status migrainosus, not intractable    Fever, unknown origin    BMI 45.0-49.9, adult    Post-cholecystectomy syndrome    History of Wolff-Parkinson-White (WPW) syndrome    Scoliosis deformity of spine             Current Meds:    Outpatient Medications Marked as Taking for the 09/25/20 encounter (Telemedicine Visit) with Lurlean Horns, MD   Medication Sig Dispense Refill    colesevelam (WELCHOL) 625 MG tablet Take 1 tablet (625 mg total) by mouth 2 (two) times daily with meals 180 tablet 3    EPINEPHrine (EPIPEN 2-PAK) 0.3 MG/0.3ML Solution Auto-injector injection Inject 0.3 mLs (0.3 mg total) into the muscle once as needed (Anaphylaxis) ; go to ER after injection 1 each 1    fexofenadine (ALLEGRA) 180 MG tablet Take 180 mg by mouth daily  hydrOXYzine (ATARAX) 10 MG tablet Take 1 tablet (10 mg total) by mouth nightly 90 tablet 3    Multiple Vitamins-Minerals (MULTIVITAMIN ADULT PO) Take by mouth daily         rizatriptan (MAXALT) 10 MG tablet Take 1 tablet (10 mg total) by mouth once as needed for Migraine May repeat in 2 hours if needed 36 tablet 3    valacyclovir (VALTREX) 1000 MG tablet Take 2 tabs at onset; repeat once in 12 hours.  Total 4 tabs per outbreak 12 tablet 3          Allergies:    Allergies   Allergen Reactions    Adhesive [Wound Dressing Adhesive]      dermabond    Latex Other (See Comments)     Swelling and redness throughout body     Other      Tropical fruits like coconuts, pineapple, kiwi, etc.    Oxycodone Other (See Comments)     Hallucinations             Past Surgical History:    Past Surgical History:   Procedure Laterality Date    ablasion  2005    BIOPSY, LYMPH NODE N/A 07/23/2018    Procedure: BIOPSY, LYMPH NODE;  Surgeon: Shelda Pal, MD;  Location: [facility];  Service: ENT;  Laterality: N/A;  NECK DEEP  LYMPH NODE BIOPSY    BONE MARROW BIOPSY  2020    CHOLECYSTECTOMY  2009    D&C DIAGNOSTIC  2010    DILATION AND CURETTAGE OF UTERUS  2010    FINGER GANGLION CYST EXCISION  04/2018    GANGLION CYST EXCISION  2014    04/2018 cyst removed from index finger-[facility]    HERNIA REPAIR  2016    HYSTERECTOMY  2012    PELVIC LAPAROSCOPY  2011    UMBILICAL HERNIA REPAIR  2016    UPPER GASTROINTESTINAL ENDOSCOPY  2009    WISDOM TOOTH EXTRACTION  1998           Family History:    Family History   Adopted: Yes           Social History:    Social History     Occupational History    Occupation: Attorney    Tobacco Use    Smoking status: Never Smoker    Smokeless tobacco: Never Used   Haematologist Use: Never used   Substance and Sexual Activity    Alcohol use: Not Currently     Alcohol/week: 0.0 standard drinks     Comment: rarely    Drug use: Never    Sexual activity: Yes     Partners: Male     Birth control/protection: None     Comment: Hysterectomy           The following sections were reviewed this encounter by the provider:   Tobacco   Allergies   Meds   Problems   Med Hx   Surg Hx   Fam Hx              Vital Signs:    Vitals:    09/25/20 1248   Temp: (!) 100.7 F (38.2 C)   TempSrc: Axillary   Weight: 142.9 kg (315 lb)      Patient reported vitals        ROS:    Review of Systems   Constitutional: Positive for fatigue and fever. Negative for chills and  unexpected weight change.   HENT:        See hpi   Respiratory: Negative for cough and shortness of breath.    Genitourinary: Negative.    Musculoskeletal: Positive for arthralgias (left knee, right hip, hands) and back pain.   Neurological: Positive for weakness. Negative for tremors and numbness.              Physical Exam:    Physical Exam  Vitals reviewed.   Constitutional:       General: She is not in acute distress.     Appearance: Normal appearance.   Pulmonary:      Effort: Pulmonary effort is normal.   Neurological:       General: No focal deficit present.      Mental Status: She is alert and oriented to person, place, and time.   Psychiatric:         Mood and Affect: Mood normal.         Behavior: Behavior normal.         Thought Content: Thought content normal.         Judgment: Judgment normal.                Assessment/Plan:        Problem List Items Addressed This Visit     Migraine with aura and without status migrainosus, not intractable     Maxalt is helpful for migraines PRN         Fever, unknown origin     Patient has had extensive workup so far unrevealing for her fevers, joint pains, fatigue  Reviewed consults from   ID at [facility]- Dr Claudette Laws  Heme Dr Carney Bern  Rheum Dr Pollyann Savoy   (needs new outpatient rheum, suggested Dr Adin Hector)  She is scheduled for hepatology consult and consideration of liver biopsy next         Relevant Orders    Ambulatory referral to Rheumatology    Post-cholecystectomy syndrome     Managed with Welchol         History of Wolff-Parkinson-White (WPW) syndrome     S/p ablation 2005, no cardiac symptoms           Other Visit Diagnoses     Polyarthralgia        Relevant Orders    Ambulatory referral to Rheumatology           This visit was completed virtually due to the restrictions of the COVID-19 pandemic. All issues above were discussed and addressed but no physical exam was performed unless allowed by visual confirmation.  If it was assessed that the patient's symptoms warranted an office visit, the patient was advised and offered an appointment in the office.           Follow-up:    Return in about 3 months (around 12/26/2020) for physical.         Lurlean Horns, MD     Time spent on telemedicine visit: 30 min

## 2020-09-25 NOTE — Assessment & Plan Note (Signed)
Managed with [employer]

## 2020-10-07 ENCOUNTER — Encounter: Payer: Self-pay | Admitting: Hematology & Oncology

## 2020-11-11 ENCOUNTER — Encounter (INDEPENDENT_AMBULATORY_CARE_PROVIDER_SITE_OTHER): Payer: Self-pay | Admitting: Internal Medicine

## 2020-11-11 ENCOUNTER — Ambulatory Visit (INDEPENDENT_AMBULATORY_CARE_PROVIDER_SITE_OTHER): Payer: Self-pay

## 2020-11-11 ENCOUNTER — Telehealth (INDEPENDENT_AMBULATORY_CARE_PROVIDER_SITE_OTHER): Payer: BLUE CROSS/BLUE SHIELD | Admitting: Internal Medicine

## 2020-11-11 DIAGNOSIS — R509 Fever, unspecified: Secondary | ICD-10-CM

## 2020-11-11 DIAGNOSIS — R059 Cough, unspecified: Secondary | ICD-10-CM

## 2020-11-11 DIAGNOSIS — R5382 Chronic fatigue, unspecified: Secondary | ICD-10-CM

## 2020-11-11 DIAGNOSIS — M659 Synovitis and tenosynovitis, unspecified: Secondary | ICD-10-CM

## 2020-11-11 NOTE — Patient Instructions (Signed)
Dear Hailey [name] ,     Thanks for arranging a video visit with me.    Here are things I'd like you to do following today's visit:  Please have blood taken for labs.  Please obtain a CXR  Please schedule with plastics/hand surgery Dr. Kellie Simmering    You may retrieve any orders from this visit in Mychart by using these steps:    After signing into Mychart  1) click Messaging  2) click Letters  3) select the Requisition  4) Review and click print    Please contact the clinic at [phone] with any questions and to schedule your follow up visit.    Sincerely,    Debe Coder, MD  Rheumatologist  [facility]  [phone]

## 2020-11-11 NOTE — Progress Notes (Signed)
RHEUMATOLOGY FOLLOW UP TELEMEDICINE NOTE    Telemedicine Documentation Requirements  Originating site (Patient location): home  Distant site (Provider location): Office  Type of Visit: Video Visit  Provider and Title: Dr. Adin Hector  Verbal Consent has been obtained to conduct a telemedicine visit on this date in order to minimize exposure to COVID-19.    PCP: Lurlean Horns, MD    HPI:    This patient is a 40 y.o. year female with Past Medical History of Prior Cholecystectomy, Wold-Parkinson White, Prior Hysterectomy who returns for fup of FUO-she is presenting as a transition of care visit-last saw Dr. Pollyann Savoy 02/2020.    Previous records reviewed and summarized here along with patient input:  HPI:    She had a previous Right hand ganglion cyst removed June 2019 from her index finger.  She says 2-3 days later-she says the salivary gland was swollen.  And developed infection in her salivary gland-she was having dysphagia.  She was seen by ENT and Infectious Disease.  She was treated with 4-5 courses of antibiotics-she had white exudate in her salivary gland-she still feels like she has enlarged cervical lymph nodes.     She reports ongoing Right hand pain-worse with movement-no morning pain or stiffness-no inflammatory arthralgias in hands or feet    She underwent excisional lymph node biopsy revealing for reactive changes.    She says she still has lower salivary gland swelling and cervical lymph nodes since this time.  She reports history of dry eyes but no recent issues.      Patient Denies any changes in health from last visit.  She still has ongoing fatigue-she feels like she could nap at any time.  She feels like she has diffuse weakness.  She feels lightheadedness frequently upon transitioning positions.  She reports daily fevers 100.4->101    She reports occasional cough, sometimes scant hemoptysis-she reports history of CXR which was normal.  She says CT Chest was 08/2018-she reports bother were normal.       Recently she notes fingers turning white-red-possibly Raynaud's-no digital ulcers or scarring pits.    She reports occasional hematochezia.    She has undergone extensive work up for FUO with Hematology, Dr. Linde Gillis and Infectious Disease (recently saw Dr. Claudette Laws at [facility]) with unrevealing work up thus far.-Labs and imaging reviewed below.    She lives with her husband and dog.  She works as an Pensions consultant.  She works with [employer] in Barr[employer]'s [employer].  She denies tobacco, She drinks alcohol very rarely.  She denies recreational drug use    -She saw Dr. Claudette Laws with infectious disease at [facility]    She saw Hepatology at [facility] was told only fatty liver disease-no autoimmune disease.    General: Denies chills, night sweats, unintentional weight loss.  HEENT: Denies vision changes, eye pain, red eye, dry eyes, dry mouth, uveitis, hearing changes, difficulty or painful swallowing, swollen lymph nodes, oral or nasal ulcers.  Cardiovascular: Denies chest pain, dyspnea, palpitations  Pulmonary: Reports intermittent cough, occasional hemoptysis/scant amounts, wheezing  Gastrointestinal: Denies abdominal pain, diarrhea, hematochezia  Genitourinary: Denies dysuria, hematuria  Musculoskeletal: Denies dactylitis, enthesitis, podagra, Raynaud's phenomenon  Dermatologic: Denies skin changes, rashes, psoriasis, alopecia, photosensitive rash  Neurologic: Denies muscle weakness, paresthesias  Miscellaneous: Denies history of blood clots, seizures, strokes, pleural or pericardial effusions  She reports one early TM miscarriage    Labs Reviewed:    Recent [facility] Reviewed:  10/23/20  C-ANCA, P-ANCA, MPO, PR3  all negative  Mitochondrial Ab-  ANA IFA-  Scl70-  [facility]-  RNP-  SMith-  TPPA Ab-      05/18/20  Ferritin 291  CMP AST/A:T 59/70  CBC elevated lymphocytes    02/06/20  MPO-  PR3-  SSA-  aCL IgM 14  aCL IgG, IgA -  Lupus anticoagulant -  B2IgG, IgM -  ACE wnl  CCP-  RF-  Vit D 31  TSH wnl  ESR wnl  CK wnl  C4  wnl  C3 wnl  CRP wnl    07/2019  ANA Screen-  LDH wnl    07/23/2018 lymph node biopsy:DIAGNOSIS:       Lymph node, deep right neck, excisional biopsy:    -Lymph node with non-specific reactive changes   Wound, AFB and Fungal cultures negative at this time    BONE MARROW 05/03/2019    BONE MARROW, LEFT POSTERIOR ILIAC CREST, CORE BIOPSY, CLOT SECTION,   ASPIRATE SMEAR AND TOUCH PREP:     - NORMOCELLULAR BONE MARROW (approximate50-60%) WITH TRILINEAGE HEMATOPOIESIS, NORMAL MYELOID TO ERYTHROID RATIO AND ADEQUATE NUMBERS OF   MEGAKARYOCYTES WIT H UNREMARKABLE MORPHOLOGY     - A FEW SMALL REACTIVE LYMPHOID AGGREGATES IDENTIFIED, AND MILDLY INCREASED NUMBERS OF EOSINOPHILS     - PENDING CYTOGENETIC STUDIES OF BONE MARROW SAMPLE - Remain pending as of 05/14/2019.    COMMENT:   The concurrent flow cytometry performed on the bone marrow aspirate sample shows no evidence of immunophenotypic abnormalities      Imaging Reviewed:    08/16/20  MRI Brain:IMPRESSION:     1. No intracranial mass, hemorrhage, or hydrocephalus is detected.  2. There is normal pattern of enhancement of the brain parenchyma and  meningeal surfaces.    9/18 MRI Abdomen:  IMPRESSION:      1. Diffuse hepatic steatosis. No suspicious focal hepatic lesions.  2. No biliary ductal dilatation or choledocholithiasis.    02/03/20  MRI Pelvis:  IMPRESSION:        Cystic structure at the vaginal cuff, flank by both ovaries not  significantly changed in size compared to 2019. No abnormal internal  enhancement. This favors a benign process such as a peritoneal inclusion  cyst or a paraovarian cyst.    12/14/19 MRI R Hand:  IMPRESSION:     1.  No suspicious soft tissue mass, osseous mass, or masslike  enhancement in the right hand or wrist to correlate with the diffuse FDG  uptake on recent PET/CT.  2.  There is mild enhancing synovitis in the right wrist joint without  active joint erosion or bony destruction.  3.  A small dorsal ganglion cyst  overlying the scapholunate interval.    12/06/19  Full Body PET Scan:    Hypermetabolic soft tissue mass in palmar R hand surface at base of the second digit-stable pulmonary nodulke 1.3 cm left lower lobe-recommend follow up Chest CT 3 months       The following sections were reviewed this encounter by the provider:   Tobacco   Allergies   Meds   Problems   Med Hx   Surg Hx   Fam Hx          PMH/PSH:  Past Medical History:   Diagnosis Date    Abnormal Pap smear of cervix     Anemia ~12    Pre-hysterectomy    BMI 45.0-49.9, adult     Breast lump     Diarrhea     occasional  Fever     for past ~ 3 months (avg daily fever 100-101)    Gastroesophageal reflux disease     Headache     Herpes simplex virus (HSV) infection     Meningitis     Post-operative nausea and vomiting     Scoliosis     Swollen lymph nodes         Past Surgical History:   Procedure Laterality Date    ablasion  2005    BIOPSY, LYMPH NODE N/A 07/23/2018    Procedure: BIOPSY, LYMPH NODE;  Surgeon: Shelda Pal, MD;  [id number];  Service: ENT;  Laterality: N/A;  NECK DEEP LYMPH NODE BIOPSY    BONE MARROW BIOPSY  2020    CHOLECYSTECTOMY  2009    D&C DIAGNOSTIC  2010    DILATION AND CURETTAGE OF UTERUS  2010    FINGER GANGLION CYST EXCISION  04/2018    GANGLION CYST EXCISION  2014    04/2018 cyst removed from index finger-[facility]    HERNIA REPAIR  2016    HYSTERECTOMY  2012    PELVIC LAPAROSCOPY  2011    UMBILICAL HERNIA REPAIR  2016    UPPER GASTROINTESTINAL ENDOSCOPY  2009    WISDOM TOOTH EXTRACTION  1998        FH/SH:  Family History   Adopted: Yes       Social History     Tobacco Use    Smoking status: Never Smoker    Smokeless tobacco: Never Used   Vaping Use    Vaping Use: Never used   Substance Use Topics    Alcohol use: Not Currently     Alcohol/week: 0.0 standard drinks     Comment: rarely    Drug use: Never        Meds/ Allergies:  Outpatient Medications Marked as Taking for  the 11/11/20 encounter (Telemedicine Visit) with Danella Deis, MD   Medication Sig Dispense Refill    colesevelam (WELCHOL) 625 MG tablet Take 1 tablet (625 mg total) by mouth 2 (two) times daily with meals 180 tablet 3    EPINEPHrine (EPIPEN 2-PAK) 0.3 MG/0.3ML Solution Auto-injector injection Inject 0.3 mLs (0.3 mg total) into the muscle once as needed (Anaphylaxis) ; go to ER after injection 1 each 1    hydrOXYzine (ATARAX) 10 MG tablet Take 1 tablet (10 mg total) by mouth nightly 90 tablet 3    Multiple Vitamins-Minerals (MULTIVITAMIN ADULT PO) Take by mouth daily         rizatriptan (MAXALT) 10 MG tablet Take 1 tablet (10 mg total) by mouth once as needed for Migraine May repeat in 2 hours if needed 36 tablet 3    valacyclovir (VALTREX) 1000 MG tablet Take 2 tabs at onset; repeat once in 12 hours.  Total 4 tabs per outbreak 12 tablet 3     Allergies   Allergen Reactions    Adhesive [Wound Dressing Adhesive]      dermabond    Latex Other (See Comments)     Swelling and redness throughout body     Other      Tropical fruits like coconuts, pineapple, kiwi, etc.    Oxycodone Other (See Comments)     Hallucinations          PHYSICAL EXAM  General- WNWD, alert and oriented, NAD  Eyes- EOMI, PERRL, no conjunctival injection, no scleral icterus  ENMT- face symmetric without lesions  Skin- no rash,  no alopecia  Pulm: No conversational dyspnea, breathing comfortable on room air  Neuro - no notable neurological deficits, no facial asymmetry        LABS:  Lab Results   Component Value Date    WBC 7.9 05/18/2020    HGB 14.8 05/18/2020    HCT 44.6 05/18/2020    MCV 94 05/18/2020    PLT 402 05/18/2020      Lab Results   Component Value Date    CREAT 0.74 05/18/2020    BUN 11 05/18/2020    NA 139 05/18/2020    K 4.7 05/18/2020    CL 101 05/18/2020    CO2 17 (L) 05/18/2020      Lab Results   Component Value Date    ALT 70 (H) 05/18/2020    AST 59 (H) 05/18/2020    ALKPHOS 87 05/18/2020    BILITOTAL 0.4 05/18/2020      Lab Results   Component Value Date    ESR 11 02/06/2020    ESR 9 07/26/2019    ESR 11 04/16/2019      Lab Results   Component Value Date    CRP 0.2 02/06/2020      No results found for: ANA   Lab Results   Component Value Date    RHEUMFACTOR <15.0 02/06/2020      Lab Results   Component Value Date    CCP <15.6 02/06/2020     No results found for: URICACID        ASSESSMENT/PLAN:    This patient is a 40 y.o. year female with Past Medical History of Prior Cholecystectomy, Wold-Parkinson White, Prior Hysterectomy who returns for fup of FUO-she is presenting as a transition of care visit-last saw Dr. Pollyann Savoy 02/2020.    Patient with persistent daily fevers, ongoing fatigue and intermittent cough with scant hemoptysis.  She reports previously normal CT Chest and CXR.  No other localizing symptoms except for endorsing mechanical hand/wrist pain.  Though her MRI R hand revealing small amount of enhancing synovitis-her symptoms are atypical of an inflammatory arthritis process and she is not endorsing symmetric symptoms.  I recommend she follow up with orthopaedic hand surgery for a synovial biopsy and to send for cultures including fungal and AFB.  No soft tissue mass was noted on the MRI R hand corresponding with the increased uptake on PET Scan.    Will re-check inflammatory markers and ANA subserologies given new Raynaud's though ANA-IFA is negative.     Will repeat CXR given intermittent cough, reported blood tinged sputum-Full Body PET Scan noted pulmonary nodule and follow up CT Imaging-will pursue CT Chest to follow up pending XRay results.    Extensive chart review and discussion with patient and her husband regarding previous work up and coordination and plan of care.    1. Synovitis of right hand  - XR Chest 2 Views; Future  - ANA Comprehensive Panel; Future  - IgG 1, 2, 3, and 4; Future  - Inflammatory Bowel Disease Panel; Future  - C Reactive Protein; Future  - Sedimentation rate (ESR); Future  - Thyroid  Antibodies; Future  - Referral to Plastic Surgery - EXTERNAL    2. Fever, unspecified fever cause  - XR Chest 2 Views; Future  - ANA Comprehensive Panel; Future  - IgG 1, 2, 3, and 4; Future  - Inflammatory Bowel Disease Panel; Future  - C Reactive Protein; Future  - Sedimentation rate (ESR); Future  - Thyroid Antibodies; Future  3. Chronic fatigue  - XR Chest 2 Views; Future  - ANA Comprehensive Panel; Future  - IgG 1, 2, 3, and 4; Future  - Inflammatory Bowel Disease Panel; Future  - C Reactive Protein; Future  - Sedimentation rate (ESR); Future  - Thyroid Antibodies; Future    4. Cough  - XR Chest 2 Views; Future  - ANA Comprehensive Panel; Future  - IgG 1, 2, 3, and 4; Future  - Inflammatory Bowel Disease Panel; Future  - C Reactive Protein; Future  - Sedimentation rate (ESR); Future  - Thyroid Antibodies; Future    5. Synovitis of wrist  - Referral to Plastic Surgery - EXTERNAL       Time Spent with patient: 41 min    Total Time spent reviewing records, interviewing patient and coordinating plan of care: 66 min    FOLLOW UP:  Return in about 4 weeks (around 12/09/2020) for Follow Up, 4-6 weeks in person, 40 minutes.                                                                                  Debe Coder, MD   Rheumatologist  [address]  [address]   [place], [place] [address]  P [phone]  F [phone]

## 2020-11-11 NOTE — Telephone Encounter (Signed)
Nursing assessment completed.

## 2020-11-21 LAB — SEDIMENTATION RATE: Sed Rate: 50 mm/hr — ABNORMAL HIGH (ref 0–32)

## 2020-11-21 LAB — ANA COMPREHENSIVE PANEL
Anti-DNA (DS) Antibody Quantitative: <1 IU/mL (ref 0–9)
Anti-Jo-1: <0.2 AI (ref 0.0–0.9)
Antichromatin Antibodies: <0.2 AI (ref 0.0–0.9)
Centromere B Antibody: <0.2 AI (ref 0.0–0.9)
RNP Antibodies: 0.4 AI (ref 0.0–0.9)
Ribosomal P Ab: <0.2 AI (ref 0.0–0.9)
SCL-70 ANTIBODY: <0.2 AI (ref 0.0–0.9)
Sjogren's Antibody (SS-A): <0.2 AI (ref 0.0–0.9)
Sjogren's Antibody (SS-B): <0.2 AI (ref 0.0–0.9)
Smith Antibodies: <0.2 AI (ref 0.0–0.9)
Smith/RNP Antibodies: <0.2 AI (ref 0.0–0.9)

## 2020-11-21 LAB — IGG 1, 2, 3, AND 4
Immunoglobulin G1: 685 mg/dL (ref 248–810)
Immunoglobulin G2: 306 mg/dL (ref 130–555)
Immunoglobulin G3: 33 mg/dL (ref 15–102)
Immunoglobulin G4: 59 mg/dL (ref 2–96)
Total IgG: 1173 mg/dL (ref 586–1602)

## 2020-11-21 LAB — C-REACTIVE PROTEIN: C-Reactive Protein: 3 mg/L (ref 0–10)

## 2020-11-24 LAB — INFLAMMATORY BOWEL DISEASE PANEL
Atypical pANCA: <1:20 {titer}
Saccharomyces cerevisiae AB, IgA: <20 Units (ref 0.0–24.9)
Saccharomyces cerevisiae Ab: <20 Units (ref 0.0–24.9)

## 2020-11-24 LAB — THYROID ANTIBODIES
THYROGLOBULIN AB: <1 IU/mL (ref 0.0–0.9)
Thyroid Peroxidase (TPO) AB: 12 IU/mL (ref 0–34)

## 2020-11-25 ENCOUNTER — Other Ambulatory Visit: Payer: Self-pay | Admitting: Otolaryngology

## 2020-11-25 ENCOUNTER — Other Ambulatory Visit: Payer: Self-pay | Admitting: Internal Medicine

## 2020-11-26 ENCOUNTER — Encounter (INDEPENDENT_AMBULATORY_CARE_PROVIDER_SITE_OTHER): Payer: Self-pay | Admitting: Internal Medicine

## 2020-11-27 ENCOUNTER — Encounter (INDEPENDENT_AMBULATORY_CARE_PROVIDER_SITE_OTHER): Payer: Self-pay | Admitting: Internal Medicine

## 2020-12-03 ENCOUNTER — Encounter
Admission: RE | Disposition: A | Discharge status: Home / Self Care | Payer: Self-pay | Source: Ambulatory Visit | Attending: Plastic Surgery

## 2020-12-03 ENCOUNTER — Ambulatory Visit: Payer: BLUE CROSS/BLUE SHIELD | Admitting: Anesthesiology

## 2020-12-03 ENCOUNTER — Ambulatory Visit: Payer: Self-pay

## 2020-12-03 ENCOUNTER — Encounter: Payer: Self-pay | Admitting: Plastic Surgery

## 2020-12-03 ENCOUNTER — Ambulatory Visit
Admission: RE | Admit: 2020-12-03 | Discharge: 2020-12-03 | Disposition: A | Discharge status: Home / Self Care | Payer: BLUE CROSS/BLUE SHIELD | Source: Ambulatory Visit | Attending: Plastic Surgery | Admitting: Plastic Surgery

## 2020-12-03 DIAGNOSIS — M25531 Pain in right wrist: Secondary | ICD-10-CM | POA: Insufficient documentation

## 2020-12-03 DIAGNOSIS — R000 Tachycardia, unspecified: Secondary | ICD-10-CM

## 2020-12-03 DIAGNOSIS — R509 Fever, unspecified: Secondary | ICD-10-CM | POA: Insufficient documentation

## 2020-12-03 DIAGNOSIS — L989 Disorder of the skin and subcutaneous tissue, unspecified: Secondary | ICD-10-CM | POA: Insufficient documentation

## 2020-12-03 HISTORY — PX: ARTHROTOMY, WRIST: SHX3198

## 2020-12-03 LAB — ECG 12-LEAD
Atrial Rate: 88 {beats}/min
P Axis: 40 degrees
P-R Interval: 196 ms
Q-T Interval: 370 ms
QRS Duration: 80 ms
QTC Calculation (Bezet): 447 ms
R Axis: 78 degrees
T Axis: 83 degrees
Ventricular Rate: 88 {beats}/min

## 2020-12-03 LAB — GLUCOSE WHOLE BLOOD - POCT: Whole Blood Glucose POCT: 106 mg/dL — ABNORMAL HIGH (ref 70–100)

## 2020-12-03 SURGERY — ARTHROTOMY, WRIST
Anesthesia: Anesthesia General | Laterality: Right | Wound class: Clean

## 2020-12-03 MED ORDER — ACETAMINOPHEN-CODEINE #3 300-30 MG PO TABS
1.0000 | ORAL_TABLET | ORAL | 0 refills | Status: AC | PRN
Start: 2020-12-03 — End: 2020-12-10

## 2020-12-03 MED ORDER — CEFAZOLIN SODIUM-DEXTROSE 2-3 GM-%(50ML) IV SOLR
2.0000 g | Freq: Three times a day (TID) | INTRAVENOUS | Status: DC
Start: 2020-12-03 — End: 2020-12-03

## 2020-12-03 MED ORDER — AMMONIA AROMATIC IN INHA
1.0000 | Freq: Once | RESPIRATORY_TRACT | Status: DC | PRN
Start: 2020-12-03 — End: 2020-12-03

## 2020-12-03 MED ORDER — HYDROMORPHONE HCL 0.5 MG/0.5 ML IJ SOLN
0.5000 mg | INTRAMUSCULAR | Status: DC | PRN
Start: 2020-12-03 — End: 2020-12-03
  Administered 2020-12-03: 0.5 mg via INTRAVENOUS

## 2020-12-03 MED ORDER — MEPERIDINE HCL 25 MG/ML IJ SOLN
6.2500 mg | Freq: Once | INTRAMUSCULAR | Status: DC | PRN
Start: 2020-12-03 — End: 2020-12-03

## 2020-12-03 MED ORDER — HYDROMORPHONE HCL 1 MG/ML IJ SOLN
INTRAMUSCULAR | Status: AC
Start: 2020-12-03 — End: 2020-12-03
  Administered 2020-12-03: 0.5 mg via INTRAVENOUS
  Filled 2020-12-03: qty 1

## 2020-12-03 MED ORDER — ONDANSETRON HCL 4 MG/2ML IJ SOLN
INTRAMUSCULAR | Status: AC
Start: 2020-12-03 — End: ?
  Filled 2020-12-03: qty 2

## 2020-12-03 MED ORDER — METOCLOPRAMIDE HCL 10 MG PO TABS
10.0000 mg | ORAL_TABLET | Freq: Four times a day (QID) | ORAL | 0 refills | Status: DC
Start: 2020-12-03 — End: 2020-12-22

## 2020-12-03 MED ORDER — LACTATED RINGERS IV SOLN
125.0000 mL/h | INTRAVENOUS | Status: DC
Start: 2020-12-03 — End: 2020-12-03

## 2020-12-03 MED ORDER — PROPOFOL 10 MG/ML IV EMUL (WRAP)
INTRAVENOUS | Status: DC | PRN
Start: 2020-12-03 — End: 2020-12-03
  Administered 2020-12-03: 200 mg via INTRAVENOUS

## 2020-12-03 MED ORDER — MIDAZOLAM HCL 1 MG/ML IJ SOLN (WRAP)
INTRAMUSCULAR | Status: AC
Start: 2020-12-03 — End: ?
  Filled 2020-12-03: qty 2

## 2020-12-03 MED ORDER — LACTATED RINGERS IV SOLN
INTRAVENOUS | Status: DC | PRN
Start: 2020-12-03 — End: 2020-12-03

## 2020-12-03 MED ORDER — DEXAMETHASONE SODIUM PHOSPHATE 4 MG/ML IJ SOLN (WRAP)
INTRAMUSCULAR | Status: DC | PRN
Start: 2020-12-03 — End: 2020-12-03
  Administered 2020-12-03: 4 mg via INTRAVENOUS

## 2020-12-03 MED ORDER — MORPHINE SULFATE 2 MG/ML IJ/IV SOLN (WRAP)
2.0000 mg | Status: DC | PRN
Start: 2020-12-03 — End: 2020-12-03

## 2020-12-03 MED ORDER — ACETAMINOPHEN 325 MG PO TABS
650.0000 mg | ORAL_TABLET | Freq: Once | ORAL | Status: DC | PRN
Start: 2020-12-03 — End: 2020-12-03

## 2020-12-03 MED ORDER — CEFAZOLIN SODIUM 1 G IJ SOLR
INTRAMUSCULAR | Status: AC
Start: 2020-12-03 — End: ?
  Filled 2020-12-03: qty 1000

## 2020-12-03 MED ORDER — CEFAZOLIN SODIUM 1 G IJ SOLR
INTRAMUSCULAR | Status: DC | PRN
Start: 2020-12-03 — End: 2020-12-03
  Administered 2020-12-03: 2 g via INTRAVENOUS

## 2020-12-03 MED ORDER — PROPOFOL INFUSION 10 MG/ML
INTRAVENOUS | Status: DC | PRN
Start: 2020-12-03 — End: 2020-12-03
  Administered 2020-12-03: 150 ug/kg/min via INTRAVENOUS

## 2020-12-03 MED ORDER — FENTANYL CITRATE (PF) 50 MCG/ML IJ SOLN (WRAP)
INTRAMUSCULAR | Status: AC
Start: 2020-12-03 — End: ?
  Filled 2020-12-03: qty 2

## 2020-12-03 MED ORDER — ONDANSETRON HCL 4 MG/2ML IJ SOLN
INTRAMUSCULAR | Status: DC | PRN
Start: 2020-12-03 — End: 2020-12-03
  Administered 2020-12-03: 4 mg via INTRAVENOUS

## 2020-12-03 MED ORDER — METOCLOPRAMIDE HCL 5 MG/ML IJ SOLN
10.0000 mg | Freq: Once | INTRAMUSCULAR | Status: DC | PRN
Start: 2020-12-03 — End: 2020-12-03

## 2020-12-03 MED ORDER — CEPHALEXIN 500 MG PO CAPS
500.0000 mg | ORAL_CAPSULE | Freq: Four times a day (QID) | ORAL | 0 refills | Status: AC
Start: 2020-12-03 — End: 2020-12-10

## 2020-12-03 MED ORDER — PROPOFOL 10 MG/ML IV EMUL (WRAP)
INTRAVENOUS | Status: AC
Start: 2020-12-03 — End: ?
  Filled 2020-12-03: qty 50

## 2020-12-03 MED ORDER — BUPIVACAINE-EPINEPHRINE (PF) 0.5% -1:200000 IJ SOLN
INTRAMUSCULAR | Status: DC | PRN
Start: 2020-12-03 — End: 2020-12-03
  Administered 2020-12-03: 10 mL

## 2020-12-03 MED ORDER — FENTANYL CITRATE (PF) 50 MCG/ML IJ SOLN (WRAP)
INTRAMUSCULAR | Status: DC | PRN
Start: 2020-12-03 — End: 2020-12-03
  Administered 2020-12-03 (×2): 50 ug via INTRAVENOUS

## 2020-12-03 MED ORDER — MIDAZOLAM HCL 1 MG/ML IJ SOLN (WRAP)
INTRAMUSCULAR | Status: DC | PRN
Start: 2020-12-03 — End: 2020-12-03
  Administered 2020-12-03: 2 mg via INTRAVENOUS

## 2020-12-03 MED ORDER — ONDANSETRON HCL 4 MG/2ML IJ SOLN
4.0000 mg | Freq: Once | INTRAMUSCULAR | Status: DC | PRN
Start: 2020-12-03 — End: 2020-12-03

## 2020-12-03 SURGICAL SUPPLY — 93 items
APPLICATOR CHLORAPREP 26 ML 70% ISOPROPYL ALCOHOL 2% CHLORHEXIDINE (Applicator) IMPLANT
APPLICATOR PRP 70% ISPRP 2% CHG 26ML BD (Applicator)
BANDAGE CMPR CTTN PLSTR MED MTRX 5YDX4IN (Bandage) ×1
BANDAGE CMPR PLSTR CTTN PRCR 5YDX2IN LF (Procedure Accessories)
BANDAGE COMPRESSION L5 YD X W4 IN ELASTIC HOOK LOOP CLOSURE STRETCH (Bandage) IMPLANT
BANDAGE GAUZE L3.6 YD X W3.4 IN 6 PLY ABSORBENT STRETCH TIGHT FINISH (Bandage) ×1 IMPLANT
BANDAGE GZE CTTN BLK2 3.6YDX3.4IN LF (Bandage) ×1
BANDAGE GZE CTTN MED KRLX 3.6YDX6.4IN LF (Dressing) ×1
BANDAGE KERLIX MEDIUM GAUZE L3.6 YD X (Dressing) IMPLANT
BANDAGE MEDLINE COMPRESSION L5 YD X W4 (Bandage) ×1
BANDAGE MEDLINE GAUZE L3.6 YD X W3.4 IN (Bandage) ×1
BANDAGE PROCARE COMPRESSION L5 YD X W2 (Procedure Accessories)
BANDAGE PROCARE COMPRESSION L5 YD X W2 IN 2 CLIP FASTENER BREATHABLE (Procedure Accessories) IMPLANT
COVER FLEXIBLE LIGHT HANDLE PLASTIC GREEN (Procedure Accessories) ×3 IMPLANT
COVER FLEXIBLE MEDLINE LIGHT HANDLE (Procedure Accessories) ×3
COVER LGHT HNDL PLS LF STRL FLXB DISP (Procedure Accessories) ×3
CUFF TOURNIQUET CYLINDRICAL L18 IN X W4 IN 2 PORT BLADDER QUICK (Procedure Accessories) IMPLANT
CUFF TRNQT CYL CLR CUF 18X4IN LF STRL 2 (Procedure Accessories)
DRAPE EQUIPMENT MINI C ARM 149CMX104IN 110788 (Drape) ×1 IMPLANT
DRAPE MINI C ARM (Drape) ×2
DRESSING PETRO 3% BI 3BRM GZE XR 8X1IN (Dressing) ×1
DRESSING PETROLATUM XEROFORM L8 IN X W1 (Dressing) ×1
DRESSING PETROLATUM XEROFORM L8 IN X W1 IN 3% BISMUTH TRIBROMOPHENATE (Dressing) ×1 IMPLANT
ELECTRODE ADULT PATIENT RETURN L9 FT REM POLYHESIVE ACRYLIC FOAM (Procedure Accessories) ×1 IMPLANT
ELECTRODE ELECTROSURGICAL NEEDLE L3 CM (Cautery)
ELECTRODE ELECTROSURGICAL NEEDLE L3 CM OD3/32 IN COLORADO TUNGSTEN (Cautery) IMPLANT
ELECTRODE PATIENT RETURN L9 FT VALLEYLAB (Procedure Accessories) ×1
ELECTRODE PT RTN RM PHSV ACRL FM C30- LB (Procedure Accessories) ×1
GLOVE SRG NTR RBR 6.5 BGL IND 285X85MM (Glove) ×1
GLOVE SRG NTR RBR 7.5 BGL IND 298X96MM (Glove) ×1
GLOVE SRG PLISPRN 6.5 BGL PI MIC 285MM (Glove) ×2 IMPLANT
GLOVE SRG PLISPRN 7.5 BGL PI MIC LF STRL (Glove) ×1
GLOVE SURGICAL 6 1/2 BIOGEL INDICATOR (Glove) ×1
GLOVE SURGICAL 6 1/2 BIOGEL INDICATOR POWDER FREE SMOOTH BEAD CUFF (Glove) ×1 IMPLANT
GLOVE SURGICAL 7 1/2 BIOGEL INDICATOR (Glove) ×1
GLOVE SURGICAL 7 1/2 BIOGEL INDICATOR POWDER FREE BEAD CUFF UNDERGLOVE (Glove) ×1 IMPLANT
GLOVE SURGICAL 7.5 BIOGEL PI MICRO (Glove) ×1
GLOVE SURGICAL 7.5 BIOGEL PI MICRO POWDER FREE ROUGH BEAD CUFF (Glove) ×1 IMPLANT
GOWN SRG XL SMARTGOWN LF STRL LVL 4 (Gown)
GOWN SURGICAL XL SMARTGOWN LEVEL 4 (Gown)
GOWN SURGICAL XL SMARTGOWN LEVEL 4 BREATHABLE (Gown) IMPLANT
NEEDLE DSCT TUNG MIC STRG CO 3CM LF HEAT (Cautery)
PAD ARMBOARD FOAM 20X8X2IN (Positioning Supplies) ×1
PAD ARMBOARD L20 IN X W8 IN X H2 IN (Positioning Supplies) ×1
PAD ARMBOARD L20 IN X W8 IN X H2 IN CONVOLUTE FOAM PURPLE (Positioning Supplies) ×1 IMPLANT
PAD SRGPRP 44X24IN NS CUF 9IN (Prep) ×2 IMPLANT
PADDING CAST L4 YD X W3 IN UNDERCAST (Cast)
PADDING CAST L4 YD X W4 IN UNDERCAST (Patient Supply)
PADDING CAST L4 YD X W4 IN UNDERCAST MEDLINE COTTON (Patient Supply) IMPLANT
PADDING CAST L4YD XW3IN UNDERCAST MILD STRETCH CHSV WEBRIL COTTON (Cast) IMPLANT
PADDING CST CTTN WBRL 4YDX3IN LF STRL (Cast)
PADDING CST CTTN WYTEX 4YDX4IN LF STRL (Patient Supply)
POSITIONER OR FM LF HI RSLNT CSHN (Positioning Supplies) ×1
POSITIONER OR HIGH RESILIENT CUSHION (Positioning Supplies) ×1
POSITIONER OR HIGH RESILIENT CUSHION MULTIRING FOAM HEAD RASPBERRY (Positioning Supplies) ×1 IMPLANT
SOLUTION IRR 0.9% NACL 1000ML LF STRL (Irrigation Solutions) ×1
SOLUTION IRRIGATION 0.9% SODIUM CHLORIDE (Irrigation Solutions) ×1
SOLUTION IRRIGATION 0.9% SODIUM CHLORIDE 1000 ML PLASTIC POUR BOTTLE (Irrigation Solutions) ×1 IMPLANT
SOLUTION SRGSCRB 10% PVP IOD 4OZ PLS BTL (Scrub Supplies)
SOLUTION SURGICAL SCRUB 10% PVP IODINE 4OZ PLASTIC BOTTLE AQUEOUS SKIN (Scrub Supplies) IMPLANT
SPLINT ORTH FBGLS SAFETYSPLINT 30X5IN LF (Splint)
SPLINT ORTHOPEDIC L30 IN X W5 IN PRECUT (Splint)
SPLINT ORTHOPEDIC L30 IN X W5 IN PRECUT LIGHTWEIGHT WASHABLE (Splint) IMPLANT
SPONGE GAUZE L4 IN X W4 IN 16 PLY (Dressing) ×1
SPONGE GAUZE L4 IN X W4 IN 16 PLY (Sponge) ×1
SPONGE GAUZE L4 IN X W4 IN 16 PLY MAXIMUM ABSORBENT TRAY CURITY (Sponge) ×1 IMPLANT
SPONGE GAUZE L4 IN X W4 IN 16 PLY MAXIMUM ABSORBENT USP TYPE VII (Dressing) IMPLANT
SPONGE GZE CTTN CRTY 4X4IN LF NS 16 PLY (Dressing) ×1
SPONGE GZE PLS CTTN CRTY 4X4IN LF STRL (Sponge) ×1
STAPLER SKIN L4.1 MM X W6.5 MM 35 WIDE (Staplers) ×1
STAPLER SKIN L4.1 MM X W6.5 MM 35 WIDE STAPLE CARTRIDGE APPOSE ULC (Staplers) ×1 IMPLANT
STAPLER SKN SS PLS APS U 4.1X6.5MM LF 35 (Staplers) ×1
SYSTEM WND IRR 0.05% CHG IRRISEPT LF (Solution)
SYSTEM WOUND IRRIGATION DEBRIDEMENT (Solution)
SYSTEM WOUND IRRIGATION DEBRIDEMENT CLEANSING IRRISEPT 0.05% (Solution) IMPLANT
TOWEL L27 IN X W17 IN COTTON PREWASH (Other) ×1
TOWEL L27 IN X W17 IN COTTON PREWASH DELINT HIGH ABSORBENT BLUE (Other) ×1 IMPLANT
TOWEL SRG CTTN 27X17IN LF STRL PREWASH (Other) ×1
TRAY PREOPERATIVE SKIN DRY SCRUB (Tray) IMPLANT
TRAY SRG HAND UPPER EXTREMITY ~~LOC~~ (Pack) ×1
TRAY SURGICAL HAND UPPER EXTREMITY ~~LOC~~ (Pack) ×1 IMPLANT
WIRE FIXATION OD.028 IN L6 IN KIRSCHNER (Guide Wire)
WIRE FIXATION OD.028 IN L6 IN KIRSCHNER STAINLESS STEEL 2 TROCAR (Guide Wire) IMPLANT
WIRE FIXATION OD.035 IN L6 IN KIRSCHNER (Guide Wire)
WIRE FIXATION OD.035 IN L6 IN KIRSCHNER STAINLESS STEEL 2 TROCAR (Guide Wire) IMPLANT
WIRE FIXATION OD.045 IN L6 IN KIRSCHNER (Instrument)
WIRE FIXATION OD.045 IN L6 IN KIRSCHNER STAINLESS STEEL 2 END TROCAR (Instrument) IMPLANT
WIRE FIXATION OD.062 IN L6 IN KIRSCHNER (Guide Wire)
WIRE FIXATION OD.062 IN L6 IN KIRSCHNER STAINLESS STEEL 2 TROCAR (Guide Wire) IMPLANT
WIRE FX SS KRSH .028IN 6IN NS 2 TROC (Guide Wire)
WIRE FX SS KRSH .035IN 6IN NS 2 TROC (Guide Wire)
WIRE FX SS KRSH .045IN 6IN NS 2 END TROC (Instrument)
WIRE FX SS KRSH .062IN 6IN NS 2 TROC (Guide Wire)

## 2020-12-03 NOTE — Anesthesia Postprocedure Evaluation (Signed)
Anesthesia Post Evaluation    Patient: Hailey [name]    Procedure(s):  RIGHT UPPER EXTREMITY SYNOVIAL BIOPSY    Anesthesia type: general    Last Vitals:   Vitals Value Taken Time   BP 122/58 12/03/20 1100   Temp 36.6 C (97.9 F) 12/03/20 1100   Pulse 86 12/03/20 1100   Resp 14 12/03/20 1100   SpO2 96 % 12/03/20 1100                 Anesthesia Post Evaluation:     Patient Evaluated: PACU  Patient Participation: complete - patient participated  Level of Consciousness: awake and alert  Pain Score: 1  Pain Management: adequate  Multimodal analgesia pain management approach    Airway Patency: patent    Anesthetic complications: No      PONV Status: none    Cardiovascular status: acceptable  Respiratory status: acceptable  Hydration status: acceptable        Signed by: Joselyn Arrow, MD, 12/03/2020 12:26 PM

## 2020-12-03 NOTE — Transfer of Care (Signed)
Anesthesia Transfer of Care Note    Patient: Hailey [name]    Procedures performed: Procedure(s):  RIGHT UPPER EXTREMITY SYNOVIAL BIOPSY    Anesthesia type: General LMA    Patient location:Phase I PACU    Last vitals:   Vitals:    12/03/20 0635   BP: 155/74   Pulse: (!) 53   Resp: 18   Temp: 36.4 C (97.5 F)   SpO2: 99%       Post pain: Patient not complaining of pain, continue current therapy      Mental Status:awake and alert     Respiratory Function: tolerating face mask    Cardiovascular: stable    Nausea/Vomiting: patient not complaining of nausea or vomiting    Hydration Status: adequate    Post assessment: no apparent anesthetic complications and no reportable events    Signed by: Joselyn Arrow, MD  12/03/20 9:17 AM

## 2020-12-03 NOTE — Op Note (Addendum)
OPERATIVE NOTE      Date Time: 12/03/20 9:01 AM    [facility]    Patient Name:   Hailey [name]    Date of Operation:   12/03/2020    Providers Performing:   Surgeon(s):  Kellie Simmering, MD  Flonnie Hailstone, PA      Assistant (s):   Circulator: Deeann Saint, RN  Scrub Person: Hermenia Fiscal Circulator: Kathleen Argue, RN    Operative Procedure:   Procedure(s):  RIGHT UPPER EXTREMITY SYNOVIAL BIOPSY       ULNAR VOLAR FOREARM DISTAL    1. EXCISION OF LESION SUBCUTANEOUS 3 CM   2. NEUROLYSIS ULNAR NERVE AND BRANCH  3. EXPLORATION VESSELS WITHOUT REPAIR  4. MEDIAN NERVE NEUROLYSIS  5. FCU TENOLYSIS  6. Z PLASTY 1X1 VOLAR WRIST    DORSAL WRIST   1. ARTHROTOMY WRIST WITH BIOPSY AND CULTURE    WRIST BLOCK    Preoperative Diagnosis:   Pre-Op Diagnosis Codes:     * Pain in right wrist [M25.531]   FEVERS UNKNOWN ORIGIN  LESION ULNAR VOLAR DISTAL FOREARM    Postoperative Diagnosis:   SAME    Anesthesia:   TYPE OF ANESTHESIA: GEN LMA    DVT PROPHYLAXIS:   SEQUENTIAL DEVICE: Yes  CHEMOPROPHYLAXIS: No    Estimated Blood Loss:   MINIMAL    Implants:   * No implants in log *    Drains:   Drains:No    Specimens:       Findings:   NO GANGLION AT DISTAL FOREARM.  ABNORMAL ADIPOSE TISSUE VS NEURO ORIGINA TISSUE AROUND ULNAR NERVE AND IN DISTAL FOREARM ULNARLY WAS SENT TO PATH    Complications:   NONE    Procedure In Detail:     INDICATIONS FOR PROCEDURE:    There is a 40 year old who is had recurrent daily fevers for prolonged period of time.  All previous work-up by multiple physicians has been negative.  She had an MRI that showed some abnormalities around the wrist joint and therefore is recommended a synovial or capsular biopsy by her rheumatologist which is her physician that is taken over management and attempt to diagnose this patient's cause of her daily multiple fevers.  Additionally she reports that she has a cystic or masslike lesion in her volar distal forearm on the ulnar side.  She has pain  in the ulnar nerve distribution and has a small soft mobile lesion that is poorly defined in the volar ulnar distal forearm but clearly is visible externally and not specifically discrete but appears to be into lobules.    Given the pain I recommended excision of this area and given her multiple fevers we recommended synovial biopsy and discussed the expectations as well as the risks benefits alternatives at length.  Patient is aware that this is a low yield procedure potentially but given that all of her previous extensive work-up for the cause of her daily fevers is negative that she would like to proceed with her rheumatologist recommendation.    Risk discussed including not limited to bleeding, infection, need for additional surgery or damage to surrounding structures, decreased function, pain, scarring, stiffness, rupture of repairs if tendon injury is repaired, chronic pain, chronic wound.  We discussed possible flap failure, graft failure, and missed injuries.  We also discussed that hand therapy could be vital to the recovery and that patient compliance and hand therapy can be a large determiner of outcomes and success as  much as the original surgery.    Risks, benefits, and alternatives were discussed at length.  All questions were answered.  Patient wished to proceed with surgery.  Informed consent was obtained prior to the procedure.    PROCEDURE IN DETAIL:    Patient was preopped and brought to the operating room.  Patient was identified as the patient by the operative team.  Patient was placed in the appropriate position on the operating table.      Patient was then placed under general anesthesia by the department anesthesia.  Appropriate position was finalized, all pressure points were cushioned, SCDs were on and functioning prior to the induction of anesthesia.  Caprini score dictated if the chemoprophylaxis was also required for DVT prevention.      Upper arm tourniquet was placed on the affected  extremity.    Patient was appropriately positioned padded and prepped and draped usual sterile fashion.  Complete timeout procedure was done.    Gentle Esmarch exsanguination and tourniquet inflation was performed.  We started with the volar wrist which had been marked off in the preoperative holding area in consultation with the patient.  A longitudinal incision was made over this area.    Skin was opened with a 15 blade and subcutaneous tissue divided with electrocautery down to the area of fullness which appeared to be largely subcutaneous fatty tissue.  No ganglion cyst was identified in the area and the fatty tissue did appear to be slightly abnormal and also had displaced the ulnar neurovascular bundle which was in a more superficial position than normal.  Dissection was taken to the flexor carpi ulnaris which was identified.  Dissection was then taken just radial to that where the ulnar neurovascular bundle was identified with the artery more superficial than the tendon and this fullness which appeared to be adipose type tissue or potentially nerve origin tissue was excised.    Neurolysis was done on the ulnar nerve.  Ulnar vessels were preserved without injury after full exploration.  There was no injury noted and no repair was required.  Flexor carpi ulnaris tendon was tenolysed.  The full tissue that was around this area was excised and sent off the field as a permanent specimen.    Dissection was also taken towards the radial side where the median nerve was sitting in what appeared to be more normal anatomic tissue and a normal anatomical position.  Neurolysis was done of the median nerve in the distal forearm.    Deep dermis and skin was closed with Monocryl sutures with the exception of an area of 1 x 1 cm.  This area was designed for a 45 degree Z-plasty to prevent longitudinal contracture.  Z-plasties were designed and incised with a 15 blade and transposed.  Corners were closed with 5-0 Monocryl in  an interrupted fashion.  Straight limbs were closed with 5-0 Monocryl in running fashion.    Attention was then turned towards the dorsal wrist.  Longitudinal incision was made over the dorsal wrist at the wrist joint.  Dissection was taken down to the fourth extensor compartment.    Dissection was taken down to the fourth extensor compartment while preserving the extensor retinaculum.  A small transverse incision in the area just distal to the extensor retinaculum was done to allow for adequate exposure and retraction of the fourth compartment tendons.  The dorsal wrist joint was then incised longitudinally to avoid injury to the PIN sensory nerve branch.  Small specimen was excised  and passed off the field as specimen.  Arthrotomy was ensured to be complete and wrist joint fluid appeared to be normal in quality and consistency.  Cultures were taken and sent to the microbiology lab.    Deep dermis was closed with Monocryl skin was closed with Monocryl in a running fashion.    Wrist block was administered with Marcaine.     Dressings and short arm splint were applied and patient was brought out of anesthesia and transferred to PACU in stable condition having tolerated this procedure well.    Counts were correct and I was present and personally performed all critical portions of the case.           Signed by: Kellie Simmering, MD

## 2020-12-03 NOTE — Anesthesia Preprocedure Evaluation (Signed)
Anesthesia Evaluation    AIRWAY    Mallampati: II    TM distance: <3 FB  Neck ROM: limited  Mouth Opening:limited  Planned to use difficult airway equipment: Yes CARDIOVASCULAR    regular and normal       DENTAL    no notable dental hx     PULMONARY    clear to auscultation     OTHER FINDINGS    Synovial cyst right wrist  BMI 49  Hx of difficult intubation                Relevant Problems   NEURO/PSYCH   (+) History of Wolff-Parkinson-White (WPW) syndrome   (+) Migraine with aura and without status migrainosus, not intractable      CARDIO   (+) Migraine with aura and without status migrainosus, not intractable               Anesthesia Plan    ASA 3     general                     Detailed anesthesia plan: general LMA            informed consent obtained      pertinent labs reviewed             Signed by: Joselyn Arrow, MD 12/03/20 7:11 AM

## 2020-12-03 NOTE — PACU (Signed)
Bigeminy and frequent PVCs were observed. Patient denied chest pain and shortness of breath. Anesthesiologist Dr. Adolphus Birchwood was notified. Dr. Adolphus Birchwood was at bedside. 12 lead EKG was ordered by Dr.Andros. 12 Lead EKG was obtained and Dr. Adolphus Birchwood was notified of results. Heart rhythm strips were given to patient. Patient was cleared to go to phase 2 by Dr. Adolphus Birchwood. Dr. Adolphus Birchwood verbalized that he will talk the patient's husband before discharge to home. Receiving RN Roopa was notified.

## 2020-12-03 NOTE — Discharge Instr - AVS First Page (Addendum)
Reason for your Hospital Admission:  Right wrist ganglion cyst removal and synovial biopsy      Instructions for after your discharge:  Discharge Instructions for Plastics Surgery  If you had general anesthesia, it may take a day or more to fully recover. So, for at least the next 24 hours: Do not drive or use machinery or power tools; do not drink alcohol; and do not make any major decisions.  Diet  Start with liquids and light foods (such as dry toast, bananas, and applesauce). As you feel up to it, slowly return to your normal diet.  To avoid nausea, eat before taking narcotic pain medications.  Medications  Take all medications as instructed.  Take pain medications on time. Do not wait until the pain is bad before taking your medications. You have had local anesthesia as a post-operative nerve block for pain control, expect this effect to last 2-4 hours and take pain medication as directed when you start to feel pain.  Avoid alcohol while on pain medications.  Activity  Non weightbearing to operative extremity with splint on at all times  Bandage Care  Keep dressings clean, dry and intact  When you bath you must keep the dressings dry. Place a bag over the dressings  JP drain: Keep a log of the output daily and bring sheet to the office   What to Expect  It is normal to have the following:  Bruising and slight swelling to the operative site  A small amount of blood on the dressing  Call the doctor if you have:  Continuous bleeding through the bandage  Excessive swelling, increased bleeding, or redness  Fever over 100.72F or chills  Pain unrelieved by pain medications  Foot feels cold to the touch or numb  Increased ache in your leg or foot  Chest pain or shortness of breath  Anything unusual that concerns you        Keep splint to right upper extremity on at all times. Keep splint clean, dry, intact  Nonweightbearing right upper extremity  Take postoperative antibiotics as prescribed until completed. Take postop  pain medications as needed for breakthrough pain, otherwise use tylenol as needed.  Contact the office to schedule an appointment with Dr. Toney Rakes in 1 week.    Anesthesia Discharge Instructions: After Your Surgery  Youve just had surgery. During surgery you were given medicine called anesthesia to keep you relaxed and comfortable. After surgery you may have some pain or nausea. This is common. Your doctor or nurse will show you how to take care of yourself when you go home. He or she will also answer your questions. Here are some tips for feeling better and getting well after surgery.        For the first 24 hours after your surgery:  Do not drive or use heavy equipment.   Do not make important decisions or sign legal papers.  Do not drink alcohol.  Have someone stay with you, if needed. He or she can watch for problems and help keep you safe.  Have an adult family member or friend drive you home.    Managing pain  If you have pain after surgery, pain medicine will help you feel better. Take it as ordered by your surgeon, before pain becomes severe. Also, ask your doctor about other ways to control pain. This might be with rest, ice, repositioning, and elevation. Follow any other instructions your surgeon or nurse gives you.    Tips  for taking pain medicine:    Stay on schedule with your medication  Most pain relievers taken by mouth need at least 20 to 30 minutes to start to work.  Taking medicine on a schedule can help you remember to take it. Try to time your medicine so that you can take it before starting an activity. This might be before you get dressed, go for a walk, or sit down for dinner.    Common side effects of prescription pain medications  Pain medicines can cause nausea. Eat a little food before taking pain medicine to avoid this.  Constipation is a common side effect of pain medicines. Drinking lots of fluids and eating foods, such as fruits and vegetables, that are high in fiber can also help.    Call your doctor before taking any medicines such as laxatives or stool softeners to help ease constipation. Also, ask if you should avoid any foods. Remember, do not take laxatives unless your surgeon has prescribed them  Drinking alcohol and taking pain medicines can cause dizziness and slow your breathing. It can even be deadly. Do not drink alcohol while taking pain medicines.  Pain medicines can make you react more slowly to things. Do not drive or run machinery while taking pain medicines.    Important facts about Acetaminophen (Tylenol)  Acetaminophen or Tylenol is a common pain reliever.  Check your prescription pain medication labels to see if it contains acetaminophen.  Your health care provider may tell you to take acetaminophen to help ease your pain. Ask him or her how much you should to take each day.   Some prescription pain medicines have acetaminophen and other ingredients. Using both prescription pain medicines and over the counter (OTC) acetaminophen for pain can cause you to overdose.  Max dose of acetaminophen is 4000mg /24 hours for most people. Overdosing on acetaminophen may lead to liver failure.  Read the labels on your (over the counter) medicines with care. This will help you to clearly know the list of ingredients, how much to take, and any warnings. This will prevent you from taking too much acetaminophen.  If you have questions or do not understand the information, ask your pharmacist or health care provider to explain it to you before you take the OTC medicine.    Managing nausea  Some people have an upset stomach after surgery. This is often because of anesthesia, pain, pain medicines, or the stress of surgery. If you were on a special food plan before surgery, ask your doctor if you should follow it while you get better. The following are tips to help you manage nausea after anesthesia:  Do not push yourself to eat. Your body will tell you when to eat and how much.  Start off with  clear liquids and soup. They are easier to digest.  Next try semi-solid foods such as mashed potatoes, applesauce, and gelatin, as you feel ready.  Slowly move to solid foods. Dont eat fatty, rich, or spicy foods at first.  Do not force yourself to have 3 large meals a day. Instead eat smaller amounts more often.  Take pain medicines with a small amount of food, such as crackers or toast, to avoid nausea.     Call your surgeon if:  You have worsening pain an hour after taking pain medicine. The pain medicine may not be strong enough.  You feel too sleepy, dizzy, or groggy. The pain medicine may be too strong.  You have side effects  like persistent nausea, vomiting, or skin changes such as rash, itching, or hives.   You have bleeding through your dressing or your dressing becomes saturated.  You have signs of infection such as redness, fever, or increased/foul smelling drainage.      If you have obstructive sleep apnea (OSA)  You were given anesthesia medicine during surgery to keep you comfortable and free of pain. After surgery, you may have more apnea spells because of this medicine and other medicines you were given. These episodes may last longer than usual.   At home:   Keep using the continuous positive airway pressure (CPAP) device when you sleep. Unless your health care provider tells you not to, use it when you sleep,   or take a nap; day or night. CPAP is a common device used to treat OSA.   *    Sleep/nap in upright position(ie. with pillows), or use a recliner for first week  and/or while on opioid medications or medications that are sedating.  *     Another adult needs to be with you at all times during the first 24 hours home.  *     Limit opioid use when possible and use alternative comfort measures.  * Talk with your provider before taking any pain medicine, muscle relaxants, or  sedatives. Your provider will tell you about the possible dangers of taking these         medicines.

## 2020-12-03 NOTE — H&P (Signed)
HISTORY AND PHYSICAL EXAM    Date Time: 12/03/20 7:09 AM  Patient Name: Hailey [name]  Attending Physician: Kellie Simmering, MD    Assessment:   40 year old, RHD female with PMH WPW, Raynaud's, and fever of unknown origin presenting for R synovial biopsy and culture.    Afebrile,VSS  Periop ABX: Ancef  DVT ppx: SCDs (Caprini Score 4)    Plan:   -Plan OR today with PRS  -NPO since MN  -Ancef periop ABX  -SCDs     History of Present Illness:   Hailey [name] is a 40 y.o. female who presents to the hospital with PMH WPW, Raynaud's, fever of unkown origin. Patient referred by rheumatologist who recommends right synovial biopsy and culture for possible cause of unknown fevers. Patient reports daily fevers. Denies pain, chills, nausea, vomiting.     Physical Exam:     Vitals:    12/03/20 0635   BP: 155/74   Pulse: (!) 53   Resp: 18   Temp: 97.5 F (36.4 C)   SpO2: 99%       Intake and Output Summary (Last 24 hours) at Date Time  No intake or output data in the 24 hours ending 12/03/20 0709    General appearance - nad, alert, appropriate  Eyes - sclera anicteric, eomi  Heart - well perfused  Lungs - non-labored breathing, no retractions or cyanosis  Neurological - alert, oriented, normal speech, no focal findings   Musculoskeletal - no obvious deformity or swelling  RUE:  -No TTP  -Full ROM  -2 Ganglion cysts noted over volar wrist  -Palpable ulnar, radial pulses 2+  -SILT  -CRT<2seconds      Past Medical History:     Past Medical History:   Diagnosis Date    Abnormal Pap smear of cervix     Anemia ~12    Pre-hysterectomy    BMI 45.0-49.9, adult     Breast lump     Diarrhea     occasional    Fever     for past ~ 3 months (avg daily fever 100-101)    Gastroesophageal reflux disease     Headache     Herpes simplex virus (HSV) infection     Meningitis     Post-operative nausea and vomiting     Scoliosis     Swollen lymph nodes        Past Surgical History:     Past Surgical History:    Procedure Laterality Date    ablasion  2005    BIOPSY, LYMPH NODE N/A 07/23/2018    Procedure: BIOPSY, LYMPH NODE;  Surgeon: Shelda Pal, MD;  Location: [facility];  Service: ENT;  Laterality: N/A;  NECK DEEP LYMPH NODE BIOPSY    BONE MARROW BIOPSY  2020    CHOLECYSTECTOMY  2009    D&C DIAGNOSTIC  2010    DILATION AND CURETTAGE OF UTERUS  2010    FINGER GANGLION CYST EXCISION  04/2018    GANGLION CYST EXCISION  2014    04/2018 cyst removed from index finger-[facility]    HERNIA REPAIR  2016    HYSTERECTOMY  2012    PELVIC LAPAROSCOPY  2011    UMBILICAL HERNIA REPAIR  2016    UPPER GASTROINTESTINAL ENDOSCOPY  2009    WISDOM TOOTH EXTRACTION  1998       Family History:     Family History   Adopted: Yes       Social  History:     Social History     Socioeconomic History    Marital status: Married     Spouse name: Not on file    Number of children: 0    Years of education: Not on file    Highest education level: Not on file   Occupational History    Occupation: Attorney    Tobacco Use    Smoking status: Never Smoker    Smokeless tobacco: Never Used   Haematologist Use: Never used   Substance and Sexual Activity    Alcohol use: Not Currently     Alcohol/week: 0.0 standard drinks     Comment: rarely    Drug use: Never    Sexual activity: Yes     Partners: Male     Birth control/protection: None     Comment: Hysterectomy   Other Topics Concern    Not on file   Social History Narrative    Diet: Non specific         Caffeine per day: tea 1 cup         ACD: N        Medical POA: N        DNR: N        Exercise: N     Social Determinants of Psychologist, prison and probation services Strain:     Difficulty of Paying Living Expenses: Not on file   Food Insecurity:     Worried About Programme researcher, broadcasting/film/video in the Last Year: Not on file    [employer] of Food in the Last Year: Not on file   Transportation Needs:     Lack of Transportation (Medical): Not on file    Lack of Transportation  (Non-Medical): Not on file   Physical Activity:     Days of Exercise per Week: Not on file    Minutes of Exercise per Session: Not on file   Stress:     Feeling of Stress : Not on file   Social Connections:     Frequency of Communication with Friends and Family: Not on file    Frequency of Social Gatherings with Friends and Family: Not on file    Attends Religious Services: Not on file    Active Member of Clubs or Organizations: Not on file    Attends Banker Meetings: Not on file    Marital Status: Not on file   Intimate Partner Violence:     Fear of Current or Ex-Partner: Not on file    Emotionally Abused: Not on file    Physically Abused: Not on file    Sexually Abused: Not on file   Housing Stability:     Unable to Pay for Housing in the Last Year: Not on file    Number of Places Lived in the Last Year: Not on file    Unstable Housing in the Last Year: Not on file       Allergies:     Allergies   Allergen Reactions    Adhesive [Wound Dressing Adhesive]      dermabond    Latex Other (See Comments)     Swelling and redness throughout body     Other      Tropical fruits like coconuts, pineapple, kiwi, etc.    Oxycodone Other (See Comments)     Hallucinations       Medications:     Medications Prior to  Admission   Medication Sig    colesevelam (WELCHOL) 625 MG tablet Take 1 tablet (625 mg total) by mouth 2 (two) times daily with meals    hydrOXYzine (ATARAX) 10 MG tablet Take 1 tablet (10 mg total) by mouth nightly    Multiple Vitamins-Minerals (MULTIVITAMIN ADULT PO) Take by mouth daily       rizatriptan (MAXALT) 10 MG tablet Take 1 tablet (10 mg total) by mouth once as needed for Migraine May repeat in 2 hours if needed    valacyclovir (VALTREX) 1000 MG tablet Take 2 tabs at onset; repeat once in 12 hours.  Total 4 tabs per outbreak    EPINEPHrine (EPIPEN 2-PAK) 0.3 MG/0.3ML Solution Auto-injector injection Inject 0.3 mLs (0.3 mg total) into the muscle once as needed  (Anaphylaxis) ; go to ER after injection    fexofenadine (ALLEGRA) 180 MG tablet Take 180 mg by mouth daily       Review of Systems:   A comprehensive review of systems was: Negative except as stated in HPI  History obtained from the patient      Labs:     Results     Procedure Component Value Units Date/Time    Glucose Whole Blood - POCT [[id number]]  (Abnormal) Collected: 12/03/20 0652     Updated: 12/03/20 0655     Whole Blood Glucose POCT 106 mg/dL                                   Rads:   Radiological Procedure reviewed.    Signed by: Flonnie Hailstone, PA

## 2020-12-04 ENCOUNTER — Encounter: Payer: Self-pay | Admitting: Plastic Surgery

## 2020-12-09 LAB — LAB USE ONLY - HISTORICAL SURGICAL PATHOLOGY

## 2020-12-16 ENCOUNTER — Encounter (INDEPENDENT_AMBULATORY_CARE_PROVIDER_SITE_OTHER): Payer: Self-pay | Admitting: Internal Medicine

## 2020-12-16 ENCOUNTER — Telehealth (INDEPENDENT_AMBULATORY_CARE_PROVIDER_SITE_OTHER): Payer: BLUE CROSS/BLUE SHIELD | Admitting: Internal Medicine

## 2020-12-16 DIAGNOSIS — M659 Synovitis and tenosynovitis, unspecified: Secondary | ICD-10-CM

## 2020-12-16 DIAGNOSIS — R682 Dry mouth, unspecified: Secondary | ICD-10-CM

## 2020-12-16 DIAGNOSIS — R509 Fever, unspecified: Secondary | ICD-10-CM

## 2020-12-16 DIAGNOSIS — Z712 Person consulting for explanation of examination or test findings: Secondary | ICD-10-CM

## 2020-12-16 NOTE — Patient Instructions (Addendum)
Dear Hailey [name] ,     It was lovely to see you in clinic today.    Please call to schedule your follow up visit [phone]    Sincerely,    Debe Coder, MD  Rheumatologist  [facility]  [phone]

## 2020-12-16 NOTE — Progress Notes (Signed)
RHEUMATOLOGY FOLLOW UP TELEMEDICINE NOTE    Telemedicine Documentation Requirements  Originating site (Patient location): home  Distant site (Provider location): Office  Type of Visit: Video Visit  Provider and Title: Dr. Adin Hector  Verbal Consent has been obtained to conduct a telemedicine visit on this date in order to minimize exposure to COVID-19.    PCP: Lurlean Horns, MD    HPI:    This patient is a 40 y.o. year female with Past Medical History of Prior Cholecystectomy, Wold-Parkinson White, Prior Hysterectomy who returns for fup of FUO-she transitioned from Dr. Pollyann Savoy 02/2020.    HPI:    She had a previous Right hand ganglion cyst removed June 2019 from her index finger.  She says 2-3 days later-she says the salivary gland was swollen.  And developed infection in her salivary gland-she was having dysphagia.  She was seen by ENT and Infectious Disease.  She was treated with 4-5 courses of antibiotics-she had white exudate in her salivary gland-she still feels like she has enlarged cervical lymph nodes.     She reports ongoing Right hand pain-worse with movement-no morning pain or stiffness-no inflammatory arthralgias in hands or feet    She underwent excisional lymph node biopsy revealing for reactive changes.    She says she still has lower salivary gland swelling and cervical lymph nodes since this time.  She reports history of dry eyes but no recent issues.      Interval History:    She had the R wrist mass excised and a synovial biopsy.  She says the Anesthesiologist told her she was having PVCs.    She saw Dr. Toney Rakes and she had an excision of the wrist and synovial biopsy.  He was also concerned about skin thickening of the palms.    She still has 100.7 F, low grade fevers.  102 after surgery    Labs Reviewed:  12/03/20  DIAGNOSIS:       A. Lesion, ulnar wrist, excision:    -Fibroadipose tissue and vasculature       B. Synovial tissue, biopsy:    -Fibroadipose tissue and synovium (see comment)        Comment:    Morphologic features typical of rheumatoid arthritis or infection are    not identified.   12/03/20  Bacterial Cultures: No Growth  Fungal Cultures: NG at one week  AFB: No growth    11/20/20  ESR 50  CRP wnl  IgG subclasses wnl  ANA comprehensive panel negative        Previous Labs:    Recent [facility] Reviewed:  10/23/20  C-ANCA, P-ANCA, MPO, PR3 all negative  Mitochondrial Ab-  ANA IFA-  Scl70-  Centromere-  RNP-  SMith-  TPPA Ab-      05/18/20  Ferritin 291  CMP AST/A:T 59/70  CBC elevated lymphocytes    02/06/20  MPO-  PR3-  SSA-  aCL IgM 14  aCL IgG, IgA -  Lupus anticoagulant -  B2IgG, IgM -  ACE wnl  CCP-  RF-  Vit D 31  TSH wnl  ESR wnl  CK wnl  C4 wnl  C3 wnl  CRP wnl    07/2019  ANA Screen-  LDH wnl    07/23/2018 lymph node biopsy:DIAGNOSIS:       Lymph node, deep right neck, excisional biopsy:    -Lymph node with non-specific reactive changes   Wound, AFB and Fungal cultures negative at this time  BONE MARROW 05/03/2019    BONE MARROW, LEFT POSTERIOR ILIAC CREST, CORE BIOPSY, CLOT SECTION,   ASPIRATE SMEAR AND TOUCH PREP:     - NORMOCELLULAR BONE MARROW (approximate50-60%) WITH TRILINEAGE HEMATOPOIESIS, NORMAL MYELOID TO ERYTHROID RATIO AND ADEQUATE NUMBERS OF   MEGAKARYOCYTES WIT H UNREMARKABLE MORPHOLOGY     - A FEW SMALL REACTIVE LYMPHOID AGGREGATES IDENTIFIED, AND MILDLY INCREASED NUMBERS OF EOSINOPHILS     - PENDING CYTOGENETIC STUDIES OF BONE MARROW SAMPLE - Remain pending as of 05/14/2019.    COMMENT:   The concurrent flow cytometry performed on the bone marrow aspirate sample shows no evidence of immunophenotypic abnormalities      Imaging Reviewed:    08/16/20  MRI Brain:IMPRESSION:     1. No intracranial mass, hemorrhage, or hydrocephalus is detected.  2. There is normal pattern of enhancement of the brain parenchyma and  meningeal surfaces.    9/18 MRI Abdomen:  IMPRESSION:      1. Diffuse hepatic steatosis. No suspicious focal hepatic  lesions.  2. No biliary ductal dilatation or choledocholithiasis.    02/03/20  MRI Pelvis:  IMPRESSION:        Cystic structure at the vaginal cuff, flank by both ovaries not  significantly changed in size compared to 2019. No abnormal internal  enhancement. This favors a benign process such as a peritoneal inclusion  cyst or a paraovarian cyst.    12/14/19 MRI R Hand:  IMPRESSION:     1.  No suspicious soft tissue mass, osseous mass, or masslike  enhancement in the right hand or wrist to correlate with the diffuse FDG  uptake on recent PET/CT.  2.  There is mild enhancing synovitis in the right wrist joint without  active joint erosion or bony destruction.  3.  A small dorsal ganglion cyst overlying the scapholunate interval.    12/06/19  Full Body PET Scan:    Hypermetabolic soft tissue mass in palmar R hand surface at base of the second digit-stable pulmonary nodulke 1.3 cm left lower lobe-recommend follow up Chest CT 3 months        The following sections were reviewed this encounter by the provider:        PMH/PSH:  Past Medical History:   Diagnosis Date    Abnormal Pap smear of cervix     Anemia ~12    Pre-hysterectomy    BMI 45.0-49.9, adult     Breast lump     Diarrhea     occasional    Fever     for past ~ 3 months (avg daily fever 100-101)    Gastroesophageal reflux disease     Headache     Herpes simplex virus (HSV) infection     Meningitis     Post-operative nausea and vomiting     Scoliosis     Swollen lymph nodes         Past Surgical History:   Procedure Laterality Date    ablasion  2005    ARTHROTOMY, WRIST Right 12/03/2020    Procedure: RIGHT UPPER EXTREMITY SYNOVIAL BIOPSY;  Surgeon: Kellie Simmering, MD;  Location: [facility];  Service: Plastics;  Laterality: Right;    BIOPSY, LYMPH NODE N/A 07/23/2018    Procedure: BIOPSY, LYMPH NODE;  Surgeon: Shelda Pal, MD;  Location: [facility];  Service: ENT;  Laterality: N/A;  NECK DEEP LYMPH NODE BIOPSY    BONE MARROW BIOPSY   2020    CHOLECYSTECTOMY  2009  D&C DIAGNOSTIC  2010    DILATION AND CURETTAGE OF UTERUS  2010    FINGER GANGLION CYST EXCISION  04/2018    GANGLION CYST EXCISION  2014    04/2018 cyst removed from index finger-[facility]    HERNIA REPAIR  2016    HYSTERECTOMY  2012    PELVIC LAPAROSCOPY  2011    UMBILICAL HERNIA REPAIR  2016    UPPER GASTROINTESTINAL ENDOSCOPY  2009    WISDOM TOOTH EXTRACTION  1998        FH/SH:  Family History   Adopted: Yes       Social History     Tobacco Use    Smoking status: Never Smoker    Smokeless tobacco: Never Used   Vaping Use    Vaping Use: Never used   Substance Use Topics    Alcohol use: Not Currently     Alcohol/week: 0.0 standard drinks     Comment: rarely    Drug use: Never        Meds/ Allergies:  No outpatient medications have been marked as taking for the 12/16/20 encounter (Telemedicine Visit) with Danella Deis, MD.     Allergies   Allergen Reactions    Adhesive [Wound Dressing Adhesive]      dermabond    Latex Other (See Comments)     Swelling and redness throughout body     Other      Tropical fruits like coconuts, pineapple, kiwi, etc.    Oxycodone Other (See Comments)     Hallucinations          PHYSICAL EXAM  General- WNWD, alert and oriented, NAD  Eyes- EOMI, PERRL, no conjunctival injection, no scleral icterus  ENMT- face symmetric without lesions  Skin- no rash, no alopecia  Pulm: No conversational dyspnea, breathing comfortable on room air  Neuro - no notable neurological deficits, no facial asymmetry    MSK: R wrist bandages in place.        LABS:  Lab Results   Component Value Date    WBC 7.9 05/18/2020    HGB 14.8 05/18/2020    HCT 44.6 05/18/2020    MCV 94 05/18/2020    PLT 402 05/18/2020      Lab Results   Component Value Date    CREAT 0.74 05/18/2020    BUN 11 05/18/2020    NA 139 05/18/2020    K 4.7 05/18/2020    CL 101 05/18/2020    CO2 17 (L) 05/18/2020      Lab Results   Component Value Date    ALT 70 (H) 05/18/2020     AST 59 (H) 05/18/2020    ALKPHOS 87 05/18/2020    BILITOTAL 0.4 05/18/2020     Lab Results   Component Value Date    ESR 50 (H) 11/20/2020    ESR 11 02/06/2020    ESR 9 07/26/2019    ESR 11 04/16/2019      Lab Results   Component Value Date    CRP 3 11/20/2020    CRP 0.2 02/06/2020      No results found for: ANA   Lab Results   Component Value Date    RHEUMFACTOR <15.0 02/06/2020      Lab Results   Component Value Date    CCP <15.6 02/06/2020     No results found for: URICACID        ASSESSMENT/PLAN:    This patient is a 40  y.o. year female with Past Medical History of Prior Cholecystectomy, Wold-Parkinson White, Prior Hysterectomy who returns for fup of FUO    Recent synovial biopsy negative for vasculitis or inflammatory arthritis.  Cultures NG thus far.      She has an elevated ESR of 50 but normal CRP.  We discuss this is relatively non specific and not diagnostic-Will repeat at her next visit.    ANA subserologies negative    CXR normal.    We will follow up with joint and skin exam in the office given reported palmar thickening    1. Synovitis of right hand    2. Fever, unspecified fever cause    3. Encounter to discuss test results    4. Dry mouth       Time Spent with patient: 15 min    Total Time spent reviewing records, interviewing patient and coordinating plan of care: 23 min    FOLLOW UP:  Return in about 4 weeks (around 01/13/2021) for Follow Up, 4 weeks in person, 40 minutes.                                                                                  Debe Coder, MD   Rheumatologist  [address]  [address]   [place], [place] [address]  P [phone]  F [phone]

## 2020-12-21 ENCOUNTER — Encounter (INDEPENDENT_AMBULATORY_CARE_PROVIDER_SITE_OTHER): Payer: Self-pay | Admitting: Internal Medicine

## 2020-12-21 ENCOUNTER — Ambulatory Visit (INDEPENDENT_AMBULATORY_CARE_PROVIDER_SITE_OTHER): Payer: BLUE CROSS/BLUE SHIELD | Admitting: Internal Medicine

## 2020-12-22 ENCOUNTER — Encounter (INDEPENDENT_AMBULATORY_CARE_PROVIDER_SITE_OTHER): Payer: Self-pay | Admitting: Internal Medicine

## 2020-12-22 ENCOUNTER — Telehealth (INDEPENDENT_AMBULATORY_CARE_PROVIDER_SITE_OTHER): Payer: BLUE CROSS/BLUE SHIELD | Admitting: Internal Medicine

## 2020-12-22 DIAGNOSIS — J3089 Other allergic rhinitis: Secondary | ICD-10-CM

## 2020-12-22 DIAGNOSIS — K811 Chronic cholecystitis: Secondary | ICD-10-CM

## 2020-12-22 DIAGNOSIS — R509 Fever, unspecified: Secondary | ICD-10-CM

## 2020-12-22 DIAGNOSIS — G43109 Migraine with aura, not intractable, without status migrainosus: Secondary | ICD-10-CM

## 2020-12-22 DIAGNOSIS — B001 Herpesviral vesicular dermatitis: Secondary | ICD-10-CM

## 2020-12-22 DIAGNOSIS — Z91018 Allergy to other foods: Secondary | ICD-10-CM

## 2020-12-22 DIAGNOSIS — K915 Postcholecystectomy syndrome: Secondary | ICD-10-CM

## 2020-12-22 MED ORDER — HYDROXYZINE HCL 10 MG PO TABS
10.0000 mg | ORAL_TABLET | Freq: Every evening | ORAL | 3 refills | Status: DC
Start: 2020-12-22 — End: 2021-12-02

## 2020-12-22 MED ORDER — VALACYCLOVIR HCL 1 G PO TABS
ORAL_TABLET | ORAL | 3 refills | Status: DC
Start: 2020-12-22 — End: 2021-12-02

## 2020-12-22 MED ORDER — EPIPEN 2-PAK 0.3 MG/0.3ML IJ SOAJ
0.3000 mg | Freq: Once | INTRAMUSCULAR | 1 refills | Status: DC | PRN
Start: 2020-12-22 — End: 2021-12-02

## 2020-12-22 MED ORDER — RIZATRIPTAN BENZOATE 10 MG PO TABS
10.0000 mg | ORAL_TABLET | Freq: Once | ORAL | 3 refills | Status: DC | PRN
Start: 2020-12-22 — End: 2021-04-13

## 2020-12-22 MED ORDER — COLESEVELAM HCL 625 MG PO TABS
625.0000 mg | ORAL_TABLET | Freq: Two times a day (BID) | ORAL | 3 refills | Status: DC
Start: 2020-12-22 — End: 2021-12-02

## 2020-12-22 NOTE — Assessment & Plan Note (Signed)
welchol is helpful for her symptoms, will continue

## 2020-12-22 NOTE — Assessment & Plan Note (Signed)
Uses valtrex rarely as needed, refills sent

## 2020-12-22 NOTE — Assessment & Plan Note (Signed)
Ongoing workup with rheumatology

## 2020-12-22 NOTE — Progress Notes (Signed)
[facility] INTERNAL MEDICINE - [facility] [place] [facility]                 Date of Virtual Visit: 12/22/2020 2:57 PM        Patient ID: Hailey [name] is a 40 y.o. female.  Attending Physician: Lurlean Horns, MD       Telemedicine Eligibility:    State Location:  [x]  [facility]  []  [place]  []  [place] []  [place]  []  Other:    Personal identity shared with patient:  [x]  Yes  []  No    Education on nature of video visit shared with patient:  [x]  Yes  []  No    Verbal consent has been obtained from the patient to conduct a video and telephone visit to minimize exposure to COVID-19:   [x]  Yes  []  No    Language:  English . Translator was required:   []  Yes  [x]  No    Visit terminated since not appropriate for virtual care:  [x]  N/A  []  Reason:               Chief Complaint:    Chief Complaint   Patient presents with    Medications refill             HPI:        S/p wrist and hand surgery recently, they repaired some issues but didn't seem like there was evidence of RA.  She continues to have her frequent fevers. After the surgery they were spiking more and there was no evidence of infection    Anesthesiologist commented that she was having frequent PVCs during her surgery, they did an EKG in PACU that was normal. She has not had any palpitations or new cardiac symptoms          Problem List:    Patient Active Problem List   Diagnosis    Morbid obesity    Herpes labialis    Non-seasonal allergic rhinitis, unspecified trigger    Migraine with aura and without status migrainosus, not intractable    Fever, unknown origin    BMI 45.0-49.9, adult    Post-cholecystectomy syndrome    History of Wolff-Parkinson-White (WPW) syndrome    Scoliosis deformity of spine             Current Meds:    Outpatient Medications Marked as Taking for the 12/22/20 encounter (Telemedicine Visit) with Lurlean Horns, MD   Medication Sig Dispense Refill    Multiple Vitamins-Minerals (MULTIVITAMIN ADULT PO)  Take by mouth daily         [DISCONTINUED] colesevelam (WELCHOL) 625 MG tablet Take 1 tablet (625 mg total) by mouth 2 (two) times daily with meals 180 tablet 3    [DISCONTINUED] EPINEPHrine (EPIPEN 2-PAK) 0.3 MG/0.3ML Solution Auto-injector injection Inject 0.3 mLs (0.3 mg total) into the muscle once as needed (Anaphylaxis) ; go to ER after injection 1 each 1    [DISCONTINUED] hydrOXYzine (ATARAX) 10 MG tablet Take 1 tablet (10 mg total) by mouth nightly 90 tablet 3    [DISCONTINUED] rizatriptan (MAXALT) 10 MG tablet Take 1 tablet (10 mg total) by mouth once as needed for Migraine May repeat in 2 hours if needed 36 tablet 3    [DISCONTINUED] valacyclovir (VALTREX) 1000 MG tablet Take 2 tabs at onset; repeat once in 12 hours.  Total 4 tabs per outbreak 12 tablet 3          Allergies:    Allergies  Allergen Reactions    Adhesive [Wound Dressing Adhesive]      dermabond    Latex Other (See Comments)     Swelling and redness throughout body     Other      Tropical fruits like coconuts, pineapple, kiwi, etc.    Oxycodone Other (See Comments)     Hallucinations             Past Surgical History:    Past Surgical History:   Procedure Laterality Date    ablasion  2005    ARTHROTOMY, WRIST Right 12/03/2020    Procedure: RIGHT UPPER EXTREMITY SYNOVIAL BIOPSY;  Surgeon: Kellie Simmering, MD;  Location: [facility];  Service: Plastics;  Laterality: Right;    BIOPSY, LYMPH NODE N/A 07/23/2018    Procedure: BIOPSY, LYMPH NODE;  Surgeon: Shelda Pal, MD;  Location: [facility];  Service: ENT;  Laterality: N/A;  NECK DEEP LYMPH NODE BIOPSY    BONE MARROW BIOPSY  2020    CHOLECYSTECTOMY  2009    D&C DIAGNOSTIC  2010    DILATION AND CURETTAGE OF UTERUS  2010    FINGER GANGLION CYST EXCISION  04/2018    GANGLION CYST EXCISION  2014    04/2018 cyst removed from index finger-[facility]    HERNIA REPAIR  2016    HYSTERECTOMY  2012    PELVIC LAPAROSCOPY  2011    UMBILICAL HERNIA REPAIR  2016     UPPER GASTROINTESTINAL ENDOSCOPY  2009    WISDOM TOOTH EXTRACTION  1998           Family History:    Family History   Adopted: Yes           Social History:    Social History     Occupational History    Occupation: Attorney    Tobacco Use    Smoking status: Never Smoker    Smokeless tobacco: Never Used   Haematologist Use: Never used   Substance and Sexual Activity    Alcohol use: Not Currently     Alcohol/week: 0.0 standard drinks     Comment: rarely    Drug use: Never    Sexual activity: Yes     Partners: Male     Birth control/protection: None     Comment: Hysterectomy            The following sections were reviewed this encounter by the provider:   Tobacco   Allergies   Meds   Problems   Med Hx   Surg Hx   Fam Hx              Vital Signs:    Vitals:    12/22/20 1353   Weight: 142.9 kg (315 lb)      Patient reported vitals        ROS:    Review of Systems   Constitutional: Positive for fatigue and fever. Negative for chills and unexpected weight change.   HENT:        See hpi   Respiratory: Negative for cough and shortness of breath.    Genitourinary: Negative.    Musculoskeletal: Positive for arthralgias (left knee, right hip, hands) and back pain.   Neurological: Positive for weakness. Negative for tremors and numbness.              Physical Exam:    Physical Exam  Vitals reviewed.   Constitutional:  General: She is not in acute distress.     Appearance: Normal appearance.   Pulmonary:      Effort: Pulmonary effort is normal.   Neurological:      General: No focal deficit present.      Mental Status: She is alert and oriented to person, place, and time.   Psychiatric:         Mood and Affect: Mood normal.         Behavior: Behavior normal.         Thought Content: Thought content normal.         Judgment: Judgment normal.                Assessment/Plan:        Problem List Items Addressed This Visit     Herpes labialis     Uses valtrex rarely as needed, refills sent         Relevant  Medications    valACYclovir (VALTREX) 1000 MG tablet    Non-seasonal allergic rhinitis, unspecified trigger    Relevant Medications    hydrOXYzine (ATARAX) 10 MG tablet    Migraine with aura and without status migrainosus, not intractable     Maxalt is helpful for migraines PRN, will continue         Relevant Medications    rizatriptan (MAXALT) 10 MG tablet    colesevelam (WELCHOL) 625 MG tablet    EPINEPHrine (EPIPEN 2-PAK) 0.3 MG/0.3ML Solution Auto-injector injection    Fever, unknown origin     Ongoing workup with rheumatology         Post-cholecystectomy syndrome     welchol is helpful for her symptoms, will continue           Other Visit Diagnoses     Chronic cholecystitis        Relevant Medications    colesevelam (WELCHOL) 625 MG tablet    H/O food allergy        Relevant Medications    EPINEPHrine (EPIPEN 2-PAK) 0.3 MG/0.3ML Solution Auto-injector injection           This visit was completed virtually due to the restrictions of the COVID-19 pandemic. All issues above were discussed and addressed but no physical exam was performed unless allowed by visual confirmation.  If it was assessed that the patient's symptoms warranted an office visit, the patient was advised and offered an appointment in the office.           Follow-up:    Return for as needed.         Lurlean Horns, MD     Time spent on telemedicine visit: 15 min

## 2020-12-22 NOTE — Assessment & Plan Note (Signed)
Maxalt is helpful for migraines PRN, will continue

## 2021-01-14 ENCOUNTER — Ambulatory Visit (INDEPENDENT_AMBULATORY_CARE_PROVIDER_SITE_OTHER): Payer: BLUE CROSS/BLUE SHIELD | Admitting: Internal Medicine

## 2021-01-14 ENCOUNTER — Encounter (INDEPENDENT_AMBULATORY_CARE_PROVIDER_SITE_OTHER): Payer: Self-pay | Admitting: Internal Medicine

## 2021-01-14 VITALS — Ht 67.0 in | Wt 315.0 lb

## 2021-01-14 DIAGNOSIS — R509 Fever, unspecified: Secondary | ICD-10-CM

## 2021-01-14 DIAGNOSIS — M2550 Pain in unspecified joint: Secondary | ICD-10-CM

## 2021-01-14 DIAGNOSIS — L231 Allergic contact dermatitis due to adhesives: Secondary | ICD-10-CM

## 2021-01-14 DIAGNOSIS — Z0000 Encounter for general adult medical examination without abnormal findings: Secondary | ICD-10-CM

## 2021-01-14 DIAGNOSIS — M5489 Other dorsalgia: Secondary | ICD-10-CM

## 2021-01-14 NOTE — Patient Instructions (Signed)
Dear Hailey [name] ,     It was lovely to see you in clinic today.    Here are things I'd like you to do following today's visit:  Please have blood taken for labs.  Please have x-rays of multiple joints    We will discuss your results at your next appointment. Please set up that appointment on your way out. If there are any extraordinary abnormal results, you will hear from me or our staff here in the office.    Sincerely,    Debe Coder, MD  Rheumatologist  [facility]  [phone]

## 2021-01-14 NOTE — Progress Notes (Signed)
RHEUMATOLOGY FOLLOW UP NOTE      PCP: Lurlean Horns, MD    HPI:    This patient is a 40 y.o. year female with Past Medical History of Prior Cholecystectomy, Wold-Parkinson White, Prior Hysterectomy who returns for follow up of FUO-she transitioned from Dr. Pollyann Savoy 02/2020.    HPI:    She had a previous Right hand ganglion cyst removed June 2019 from her index finger.  She says 2-3 days later-she says the salivary gland was swollen.  And developed infection in her salivary gland-she was having dysphagia.  She was seen by ENT and Infectious Disease.  She was treated with 4-5 courses of antibiotics-she had white exudate in her salivary gland-she still feels like she has enlarged cervical lymph nodes.     She reports ongoing Right hand pain-worse with movement-no morning pain or stiffness-no inflammatory arthralgias in hands or feet    She underwent excisional lymph node biopsy revealing for reactive changes.    She says she still has lower salivary gland swelling and cervical lymph nodes since this time.  She reports history of dry eyes but no recent issues.      Interval History:    She had w bad skin reaction-raised, red to the sutures from her synovial biopsy-healing up finally.  She reports history of allergy to adhesive glu from a previous procedure.  She reports history of occasional mouth sores, no recurrent genital ulcers, no history of blood clots.  She has not seen an allergist in some time.    With recurrent fevers she develops diffuse aches, myalgias.    Previous Labs:  12/03/20  DIAGNOSIS:       A. Lesion, ulnar wrist, excision:    -Fibroadipose tissue and vasculature       B. Synovial tissue, biopsy:    -Fibroadipose tissue and synovium (see comment)       Comment:    Morphologic features typical of rheumatoid arthritis or infection are    not identified.   12/03/20  Bacterial Cultures: No Growth  Fungal Cultures: NG at one week  AFB: No growth    11/20/20  ESR 50  CRP wnl  IgG subclasses  wnl  ANA comprehensive panel negative        Previous Labs:    Recent [facility] Reviewed:  10/23/20  C-ANCA, P-ANCA, MPO, PR3 all negative  Mitochondrial Ab-  ANA IFA-  Scl70-  Centromere-  RNP-  SMith-  TPPA Ab-      05/18/20  Ferritin 291  CMP AST/A:T 59/70  CBC elevated lymphocytes    02/06/20  MPO-  PR3-  SSA-  aCL IgM 14  aCL IgG, IgA -  Lupus anticoagulant -  B2IgG, IgM -  ACE wnl  CCP-  RF-  Vit D 31  TSH wnl  ESR wnl  CK wnl  C4 wnl  C3 wnl  CRP wnl    07/2019  ANA Screen-  LDH wnl    07/23/2018 lymph node biopsy:DIAGNOSIS:       Lymph node, deep right neck, excisional biopsy:    -Lymph node with non-specific reactive changes   Wound, AFB and Fungal cultures negative at this time    BONE MARROW 05/03/2019    BONE MARROW, LEFT POSTERIOR ILIAC CREST, CORE BIOPSY, CLOT SECTION,   ASPIRATE SMEAR AND TOUCH PREP:     - NORMOCELLULAR BONE MARROW (approximate50-60%) WITH TRILINEAGE HEMATOPOIESIS, NORMAL MYELOID TO ERYTHROID RATIO AND ADEQUATE NUMBERS OF   MEGAKARYOCYTES WIT  H UNREMARKABLE MORPHOLOGY     - A FEW SMALL REACTIVE LYMPHOID AGGREGATES IDENTIFIED, AND MILDLY INCREASED NUMBERS OF EOSINOPHILS     - PENDING CYTOGENETIC STUDIES OF BONE MARROW SAMPLE - Remain pending as of 05/14/2019.    COMMENT:   The concurrent flow cytometry performed on the bone marrow aspirate sample shows no evidence of immunophenotypic abnormalities      Imaging Reviewed:    08/16/20  MRI Brain:IMPRESSION:     1. No intracranial mass, hemorrhage, or hydrocephalus is detected.  2. There is normal pattern of enhancement of the brain parenchyma and  meningeal surfaces.    9/18 MRI Abdomen:  IMPRESSION:      1. Diffuse hepatic steatosis. No suspicious focal hepatic lesions.  2. No biliary ductal dilatation or choledocholithiasis.    02/03/20  MRI Pelvis:  IMPRESSION:        Cystic structure at the vaginal cuff, flank by both ovaries not  significantly changed in size compared to 2019. No abnormal  internal  enhancement. This favors a benign process such as a peritoneal inclusion  cyst or a paraovarian cyst.    12/14/19 MRI R Hand:  IMPRESSION:     1.  No suspicious soft tissue mass, osseous mass, or masslike  enhancement in the right hand or wrist to correlate with the diffuse FDG  uptake on recent PET/CT.  2.  There is mild enhancing synovitis in the right wrist joint without  active joint erosion or bony destruction.  3.  A small dorsal ganglion cyst overlying the scapholunate interval.    12/06/19  Full Body PET Scan:    Hypermetabolic soft tissue mass in palmar R hand surface at base of the second digit-stable pulmonary nodulke 1.3 cm left lower lobe-recommend follow up Chest CT 3 months        The following sections were reviewed this encounter by the provider:   Tobacco   Allergies   Meds   Problems   Med Hx   Surg Hx   Fam Hx          PMH/PSH:  Past Medical History:   Diagnosis Date    Abnormal Pap smear of cervix     Anemia ~12    Pre-hysterectomy    BMI 45.0-49.9, adult     Breast lump     Diarrhea     occasional    Fever     for past ~ 3 months (avg daily fever 100-101)    Gastroesophageal reflux disease     Headache     Herpes simplex virus (HSV) infection     Meningitis     Post-operative nausea and vomiting     Scoliosis     Swollen lymph nodes         Past Surgical History:   Procedure Laterality Date    ablasion  2005    ARTHROTOMY, WRIST Right 12/03/2020    Procedure: RIGHT UPPER EXTREMITY SYNOVIAL BIOPSY;  Surgeon: Kellie Simmering, MD;  Location: [facility];  Service: Plastics;  Laterality: Right;    BIOPSY, LYMPH NODE N/A 07/23/2018    Procedure: BIOPSY, LYMPH NODE;  Surgeon: Shelda Pal, MD;  Location: [facility];  Service: ENT;  Laterality: N/A;  NECK DEEP LYMPH NODE BIOPSY    BONE MARROW BIOPSY  2020    CHOLECYSTECTOMY  2009    D&C DIAGNOSTIC  2010    DILATION AND CURETTAGE OF UTERUS  2010    FINGER GANGLION CYST EXCISION  04/2018    GANGLION CYST  EXCISION  2014    04/2018 cyst removed from index finger-[facility]    HERNIA REPAIR  2016    HYSTERECTOMY  2012    PELVIC LAPAROSCOPY  2011    UMBILICAL HERNIA REPAIR  2016    UPPER GASTROINTESTINAL ENDOSCOPY  2009    WISDOM TOOTH EXTRACTION  1998        FH/SH:  Family History   Adopted: Yes       Social History     Tobacco Use    Smoking status: Never Smoker    Smokeless tobacco: Never Used   Vaping Use    Vaping Use: Never used   Substance Use Topics    Alcohol use: Not Currently     Alcohol/week: 0.0 standard drinks     Comment: rarely    Drug use: Never        Meds/ Allergies:  Outpatient Medications Marked as Taking for the 01/14/21 encounter (Office Visit) with Danella Deis, MD   Medication Sig Dispense Refill    colesevelam (WELCHOL) 625 MG tablet Take 1 tablet (625 mg total) by mouth 2 (two) times daily with meals 180 tablet 3    EPINEPHrine (EPIPEN 2-PAK) 0.3 MG/0.3ML Solution Auto-injector injection Inject 0.3 mLs (0.3 mg total) into the muscle once as needed (Anaphylaxis) ; go to ER after injection 1 each 1    hydrOXYzine (ATARAX) 10 MG tablet Take 1 tablet (10 mg total) by mouth nightly 90 tablet 3    Multiple Vitamins-Minerals (MULTIVITAMIN ADULT PO) Take by mouth daily         rizatriptan (MAXALT) 10 MG tablet Take 1 tablet (10 mg total) by mouth once as needed for Migraine May repeat in 2 hours if needed 36 tablet 3    valACYclovir (VALTREX) 1000 MG tablet Take 2 tabs at onset; repeat once in 12 hours.  Total 4 tabs per outbreak 12 tablet 3     Allergies   Allergen Reactions    Adhesive [Wound Dressing Adhesive]      dermabond    Latex Other (See Comments)     Swelling and redness throughout body     Other      Tropical fruits like coconuts, pineapple, kiwi, etc.    Oxycodone Other (See Comments)     Hallucinations          PHYSICAL EXAM  There were no vitals filed for this visit.    General: In No Apparent Distress, non toxic appearing, Appears Stated Age, Alert  and Oriented  HEENT: Normocephalic, Atraumatic, Anicteric Sclerae, Pinna appear normal without erythema or edema, Oropharyngeal Membranes without erythema or exudates, No Cervical Lymphadenopathy  Cardiovascular: Normal S1, S2, no murmurs, rubs or gallops, regular heart rate  Pulmonary: No conversational dyspnea, breathing comfortably on room air, Clear to Auscultation Bilaterally, no wheezing, rhonchi or rales  Neurologic: CN 3-12 Intact, Moving all Extremities Equally, 5/5 Muscle Strength with Hand Grip, Shoulder Abduction/Adduction, Elbow Flexion/Extension, hip flexion, knee flexion/extension, ankle dorsiflexion/plantar flexion bilaterally, No cerebellar dysmetria, Ambulates Normally  Extremities: Warm, Well Perfused, +non pitting pedal L edema, 2+ palpable radial/DP pulses bilaterally  Dermatologic: No malar rash, no discoid lesions, No psoriasis plaques, No nail dystrophy, +folliculisit upper arm-healing raised erythematous rash around Right arm suture-mild palpability, w c/d/i, no drainage    MSK:     DIP:No synovitis and full range of motion  PIP: No synovitis and full range of motion  MCP: No synovitis  and full range of motion  Wrists: No synovitis and full range of motion  Elbows: No effusions, bursitis and full range of motion  Shoulders: Full Range of Motion  Ankles: No swelling, Full Range of Motion  MTP: No synovial hypertrophy  Feet: No Pain with Tarsal Squeeze  .        LABS:  Lab Results   Component Value Date    WBC 7.9 05/18/2020    HGB 14.8 05/18/2020    HCT 44.6 05/18/2020    MCV 94 05/18/2020    PLT 402 05/18/2020      Lab Results   Component Value Date    CREAT 0.74 05/18/2020    BUN 11 05/18/2020    NA 139 05/18/2020    K 4.7 05/18/2020    CL 101 05/18/2020    CO2 17 (L) 05/18/2020      Lab Results   Component Value Date    ALT 70 (H) 05/18/2020    AST 59 (H) 05/18/2020    ALKPHOS 87 05/18/2020    BILITOTAL 0.4 05/18/2020     Lab Results   Component Value Date    ESR 50 (H) 11/20/2020    ESR  11 02/06/2020    ESR 9 07/26/2019    ESR 11 04/16/2019      Lab Results   Component Value Date    CRP 3 11/20/2020    CRP 0.2 02/06/2020      No results found for: ANA   Lab Results   Component Value Date    RHEUMFACTOR <15.0 02/06/2020      Lab Results   Component Value Date    CCP <15.6 02/06/2020     No results found for: URICACID        ASSESSMENT/PLAN:    This patient is a 40 y.o. year female with Past Medical History of Prior Cholecystectomy, Wold-Parkinson White, Prior Hysterectomy who returns for fup of FUO    Given recent skin reaction to sutures-previous allergy to [place] low grade fevers-recommen Allergy evaluation for eosinophilic vs. Hyper IgE syndrome.  As of yet no evidence of distinct autoimmune disorder.  Will Send [id number]-low suspicion for Behcets but in case wound represents pathergy?    Will obtain low back XRays given back pain.    She will follow up to discuss.    1. Allergic contact dermatitis due to adhesives  - Ambulatory referral to Allergy  - CBC and differential; Future  - IgG, IgA, IgM; Future  - IgE, Quantitative; Future  - Sedimentation rate (ESR); Future  - C Reactive Protein; Future  - Miscellaneous Testing [facility]; Future    2. Fever, unspecified fever cause  - Ambulatory referral to Allergy  - CBC and differential; Future  - IgG, IgA, IgM; Future  - IgE, Quantitative; Future  - Sedimentation rate (ESR); Future  - C Reactive Protein; Future  - Miscellaneous Testing [facility]; Future    3. Arthralgia, unspecified joint  - CBC and differential; Future  - IgG, IgA, IgM; Future  - IgE, Quantitative; Future  - Sedimentation rate (ESR); Future  - C Reactive Protein; Future  - Miscellaneous Testing [facility]; Future    4. Inflammatory back pain  - XR Sacroiliac Joint 3+ Views; Future  - XR Lumbar Spine AP And Lateral; Future    5. Preventative health care  - Family Practice Referral: Ulanda Edison, MD ([facility])         Total Time spent reviewing records, interviewing patient and  coordinating plan of care: 24 min    FOLLOW UP:  Return in about 4 weeks (around 02/11/2021) for Follow Up, 4-6 weeks, video OK.                                                                                  Debe Coder, MD   Rheumatologist  [address]  [address]   [place], [place] [address]  P [phone]  F [phone]

## 2021-01-25 ENCOUNTER — Encounter (INDEPENDENT_AMBULATORY_CARE_PROVIDER_SITE_OTHER): Payer: Self-pay | Admitting: Obstetrics and Gynecology

## 2021-01-25 ENCOUNTER — Other Ambulatory Visit: Payer: Self-pay | Admitting: Internal Medicine

## 2021-01-25 ENCOUNTER — Ambulatory Visit (INDEPENDENT_AMBULATORY_CARE_PROVIDER_SITE_OTHER): Payer: BLUE CROSS/BLUE SHIELD | Admitting: Obstetrics and Gynecology

## 2021-01-25 VITALS — BP 144/92 | Ht 68.0 in

## 2021-01-25 DIAGNOSIS — Z1231 Encounter for screening mammogram for malignant neoplasm of breast: Secondary | ICD-10-CM

## 2021-01-25 DIAGNOSIS — Z01419 Encounter for gynecological examination (general) (routine) without abnormal findings: Secondary | ICD-10-CM

## 2021-01-25 DIAGNOSIS — Z8742 Personal history of other diseases of the female genital tract: Secondary | ICD-10-CM
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
## 2021-01-26 ENCOUNTER — Encounter (INDEPENDENT_AMBULATORY_CARE_PROVIDER_SITE_OTHER): Payer: Self-pay | Admitting: Internal Medicine

## 2021-01-26 LAB — CBC AND DIFFERENTIAL
Baso(Absolute): 0 10*3/uL (ref 0.0–0.2)
Basos: 1 %
Eos: 2 %
Eosinophils Absolute: 0.1 10*3/uL (ref 0.0–0.4)
Hematocrit: 45.4 % (ref 34.0–46.6)
Hemoglobin: 14.9 g/dL (ref 11.1–15.9)
Immature Granulocytes Absolute: 0 10*3/uL (ref 0.0–0.1)
Immature Granulocytes: 0 %
Lymphocytes Absolute: 2.4 10*3/uL (ref 0.7–3.1)
Lymphocytes: 30 %
MCH: 30.6 pg (ref 26.6–33.0)
MCHC: 32.8 g/dL (ref 31.5–35.7)
MCV: 93 fL (ref 79–97)
Monocytes Absolute: 0.5 10*3/uL (ref 0.1–0.9)
Monocytes: 6 %
Neutrophils Absolute: 4.9 10*3/uL (ref 1.4–7.0)
Neutrophils: 61 %
Platelets: 407 10*3/uL (ref 150–450)
RBC: 4.87 x10E6/uL (ref 3.77–5.28)
RDW: 12.4 % (ref 11.7–15.4)
WBC: 8 10*3/uL (ref 3.4–10.8)

## 2021-01-26 LAB — SEDIMENTATION RATE: Sed Rate: 7 mm/hr (ref 0–32)

## 2021-01-26 LAB — IGG, IGA, IGM
IgM: 198 mg/dL (ref 26–217)
Immunoglobulin A: 216 mg/dL (ref 87–352)
Total IgG: 1065 mg/dL (ref 586–1602)

## 2021-01-26 LAB — C-REACTIVE PROTEIN: C-Reactive Protein: 2 mg/L (ref 0–10)

## 2021-01-29 LAB — IGE: Immunoglobulin E, Total: 23 IU/mL (ref 6–495)

## 2021-02-01 LAB — HLA B51 TYPING

## 2021-02-08 ENCOUNTER — Encounter (INDEPENDENT_AMBULATORY_CARE_PROVIDER_SITE_OTHER): Payer: Self-pay | Admitting: Internal Medicine

## 2021-02-08 ENCOUNTER — Telehealth (INDEPENDENT_AMBULATORY_CARE_PROVIDER_SITE_OTHER): Payer: BLUE CROSS/BLUE SHIELD | Admitting: Internal Medicine

## 2021-02-08 DIAGNOSIS — R748 Abnormal levels of other serum enzymes: Secondary | ICD-10-CM

## 2021-02-08 DIAGNOSIS — M352 Behcet's disease: Secondary | ICD-10-CM

## 2021-02-08 DIAGNOSIS — F40240 Claustrophobia: Secondary | ICD-10-CM

## 2021-02-08 DIAGNOSIS — M5489 Other dorsalgia: Secondary | ICD-10-CM

## 2021-02-08 DIAGNOSIS — M533 Sacrococcygeal disorders, not elsewhere classified: Secondary | ICD-10-CM

## 2021-02-08 MED ORDER — DIAZEPAM 2 MG PO TABS
2.0000 mg | ORAL_TABLET | Freq: Four times a day (QID) | ORAL | 0 refills | Status: DC | PRN
Start: 2021-02-08 — End: 2021-04-13

## 2021-02-08 MED ORDER — COLCHICINE 0.6 MG PO TABS
0.6000 mg | ORAL_TABLET | Freq: Every day | ORAL | 1 refills | Status: DC
Start: 2021-02-08 — End: 2021-03-03

## 2021-02-08 NOTE — Patient Instructions (Signed)
Dear [name] ,     Thanks for arranging a video visit with me.    Here are things I'd like you to do following today's visit:  Please have blood taken for labs 2 weeks after starting colchicine  Medication instructions:  Please schedule your MRI Pelvis  Please start taking colchicine 0.6 mg daily    You may retrieve any orders from this visit in Mychart by using these steps:    After signing into Mychart  1) click Messaging  2) click Letters  3) select the Requisition  4) Review and click print    Please contact the clinic at [phone] with any questions.  Your follow up visit has been scheduled.    Sincerely,    Debe Coder, MD  Rheumatologist  [facility]  [phone]

## 2021-02-08 NOTE — Progress Notes (Signed)
RHEUMATOLOGY FOLLOW UP TELEMEDICINE NOTE  Telemedicine Documentation Requirements  Originating site (Patient location): home  Distant site (Provider location): Office  Provider and Title: Dr. Adin Hector  Type of Visit: Video Visit  Verbal Consent has been obtained to conduct a telemedicine visit on this date in order to minimize exposure to COVID-19.      PCP: Lurlean Horns, MD    HPI:    This patient is a 40 y.o. year female with Past Medical History of Prior Cholecystectomy, Wold-Parkinson White, Prior Hysterectomy who returns for follow up of FUO-she transitioned from Dr. Pollyann Savoy 02/2020.    HPI:    She had a previous Right hand ganglion cyst removed June 2019 from her index finger.  She says 2-3 days later-she says the salivary gland was swollen.  And developed infection in her salivary gland-she was having dysphagia.  She was seen by ENT and Infectious Disease.  She was treated with 4-5 courses of antibiotics-she had white exudate in her salivary gland-she still feels like she has enlarged cervical lymph nodes.     She reports ongoing Right hand pain-worse with movement-no morning pain or stiffness-no inflammatory arthralgias in hands or feet    She underwent excisional lymph node biopsy revealing for reactive changes.      Interval History:    Still with daily fevers, myalgias.  Skin reaction from wound healing.  Intermittent joint pain.  Still with ongoing inflammatory back pain.    She reports history of several weeks bloodshot red eyes-treated with steroids many years ago-no formal diagnosis of uveitis.    Labs Reviewed:  01/26/21  CRP wnl  ESR wnl  HLAB51 positive  IgE, IgA, IGG, IgM all wnl  CBC wnl      Previous Labs:  12/03/20  DIAGNOSIS:       A. Lesion, ulnar wrist, excision:    -Fibroadipose tissue and vasculature       B. Synovial tissue, biopsy:    -Fibroadipose tissue and synovium (see comment)       Comment:    Morphologic features typical of rheumatoid arthritis or infection are    not  identified.   12/03/20  Bacterial Cultures: No Growth  Fungal Cultures: NG at one week  AFB: No growth    11/20/20  ESR 50  CRP wnl  IgG subclasses wnl  ANA comprehensive panel negative        Previous Labs:    Recent [facility] Reviewed:  10/23/20  C-ANCA, P-ANCA, MPO, PR3 all negative  Mitochondrial Ab-  ANA IFA-  Scl70-  Centromere-  RNP-  SMith-  TPPA Ab-      05/18/20  Ferritin 291  CMP AST/A:T 59/70  CBC elevated lymphocytes    02/06/20  MPO-  PR3-  SSA-  aCL IgM 14  aCL IgG, IgA -  Lupus anticoagulant -  B2IgG, IgM -  ACE wnl  CCP-  RF-  Vit D 31  TSH wnl  ESR wnl  CK wnl  C4 wnl  C3 wnl  CRP wnl    07/2019  ANA Screen-  LDH wnl    07/23/2018 lymph node biopsy:DIAGNOSIS:       Lymph node, deep right neck, excisional biopsy:    -Lymph node with non-specific reactive changes   Wound, AFB and Fungal cultures negative at this time    BONE MARROW 05/03/2019    BONE MARROW, LEFT POSTERIOR ILIAC CREST, CORE BIOPSY, CLOT SECTION,   ASPIRATE SMEAR AND TOUCH PREP:     -  NORMOCELLULAR BONE MARROW (approximate50-60%) WITH TRILINEAGE HEMATOPOIESIS, NORMAL MYELOID TO ERYTHROID RATIO AND ADEQUATE NUMBERS OF   MEGAKARYOCYTES WIT H UNREMARKABLE MORPHOLOGY     - A FEW SMALL REACTIVE LYMPHOID AGGREGATES IDENTIFIED, AND MILDLY INCREASED NUMBERS OF EOSINOPHILS     - PENDING CYTOGENETIC STUDIES OF BONE MARROW SAMPLE - Remain pending as of 05/14/2019.    COMMENT:   The concurrent flow cytometry performed on the bone marrow aspirate sample shows no evidence of immunophenotypic abnormalities      Imaging Reviewed:    08/16/20  MRI Brain:IMPRESSION:     1. No intracranial mass, hemorrhage, or hydrocephalus is detected.  2. There is normal pattern of enhancement of the brain parenchyma and  meningeal surfaces.    9/18 MRI Abdomen:  IMPRESSION:      1. Diffuse hepatic steatosis. No suspicious focal hepatic lesions.  2. No biliary ductal dilatation or choledocholithiasis.    02/03/20  MRI Pelvis:  IMPRESSION:         Cystic structure at the vaginal cuff, flank by both ovaries not  significantly changed in size compared to 2019. No abnormal internal  enhancement. This favors a benign process such as a peritoneal inclusion  cyst or a paraovarian cyst.    12/14/19 MRI R Hand:  IMPRESSION:     1.  No suspicious soft tissue mass, osseous mass, or masslike  enhancement in the right hand or wrist to correlate with the diffuse FDG  uptake on recent PET/CT.  2.  There is mild enhancing synovitis in the right wrist joint without  active joint erosion or bony destruction.  3.  A small dorsal ganglion cyst overlying the scapholunate interval.    12/06/19  Full Body PET Scan:    Hypermetabolic soft tissue mass in palmar R hand surface at base of the second digit-stable pulmonary nodulke 1.3 cm left lower lobe-recommend follow up Chest CT 3 months        The following sections were reviewed this encounter by the provider:   Tobacco   Allergies   Meds   Problems   Med Hx   Surg Hx   Fam Hx            PMH/PSH:  Past Medical History:   Diagnosis Date    Abnormal Pap smear of cervix     Anemia ~12    Pre-hysterectomy    BMI 45.0-49.9, adult     Breast lump     Diarrhea     occasional    Fever     for past ~ 3 months (avg daily fever 100-101)    Gastroesophageal reflux disease     Headache     Herpes simplex virus (HSV) infection     Meningitis     Post-operative nausea and vomiting     Scoliosis     Swollen lymph nodes         Past Surgical History:   Procedure Laterality Date    ablasion  2005    ARTHROTOMY, WRIST Right 12/03/2020    Procedure: RIGHT UPPER EXTREMITY SYNOVIAL BIOPSY;  Surgeon: Kellie Simmering, MD;  Location: [facility];  Service: Plastics;  Laterality: Right;    BIOPSY, LYMPH NODE N/A 07/23/2018    Procedure: BIOPSY, LYMPH NODE;  Surgeon: Shelda Pal, MD;  Location: [facility];  Service: ENT;  Laterality: N/A;  NECK DEEP LYMPH NODE BIOPSY    BONE MARROW BIOPSY  2020    CHOLECYSTECTOMY  2009  D&C DIAGNOSTIC  2010    DILATION AND CURETTAGE OF UTERUS  2010    FINGER GANGLION CYST EXCISION  04/2018    GANGLION CYST EXCISION  2014    04/2018 cyst removed from index finger-[facility]    HERNIA REPAIR  2016    HYSTERECTOMY  2012    PELVIC LAPAROSCOPY  2011    UMBILICAL HERNIA REPAIR  2016    UPPER GASTROINTESTINAL ENDOSCOPY  2009    WISDOM TOOTH EXTRACTION  1998        FH/SH:  Family History   Adopted: Yes       Social History     Tobacco Use    Smoking status: Never Smoker    Smokeless tobacco: Never Used   Vaping Use    Vaping Use: Never used   Substance Use Topics    Alcohol use: Not Currently     Alcohol/week: 0.0 standard drinks     Comment: rarely    Drug use: Never        Meds/ Allergies:  No outpatient medications have been marked as taking for the 02/08/21 encounter (Telemedicine Visit) with Danella Deis, MD.     Allergies   Allergen Reactions    Fruit Blend Flavor [Flavoring Agent] Anaphylaxis and Swelling    Adhesive [Wound Dressing Adhesive]      dermabond    Latex Other (See Comments), Hives and Swelling     Swelling and redness throughout body   Also severe allergy to surgical glue    Oxycodone Other (See Comments)     Hallucinations    Other Itching and Rash     Tropical fruits like coconuts, pineapple, kiwi, etc.          PHYSICAL EXAM  General- WNWD, alert and oriented, NAD  Eyes- EOMI, PERRL, no conjunctival injection, no scleral icterus  ENMT- face symmetric without lesions  Skin- no rash, no alopecia  Pulm: No conversational dyspnea, breathing comfortable on room air  Neuro - no notable neurological deficits, no facial asymmetry    .        LABS:  Lab Results   Component Value Date    WBC 8.0 01/25/2021    HGB 14.9 01/25/2021    HCT 45.4 01/25/2021    MCV 93 01/25/2021    PLT 407 01/25/2021      Lab Results   Component Value Date    CREAT 0.74 05/18/2020    BUN 11 05/18/2020    NA 139 05/18/2020    K 4.7 05/18/2020    CL 101 05/18/2020    CO2 17 (L)  05/18/2020      Lab Results   Component Value Date    ALT 70 (H) 05/18/2020    AST 59 (H) 05/18/2020    ALKPHOS 87 05/18/2020    BILITOTAL 0.4 05/18/2020     Lab Results   Component Value Date    ESR 7 01/25/2021    ESR 50 (H) 11/20/2020    ESR 11 02/06/2020    ESR 9 07/26/2019    ESR 11 04/16/2019      Lab Results   Component Value Date    CRP 2 01/25/2021    CRP 3 11/20/2020    CRP 0.2 02/06/2020      No results found for: ANA   Lab Results   Component Value Date    RHEUMFACTOR <15.0 02/06/2020      Lab Results   Component Value Date  CCP <15.6 02/06/2020     No results found for: URICACID        ASSESSMENT/PLAN:    This patient is a 40 y.o. 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  Suspect daily fevers related.  Will obtain MRI Pelvis to rule out sacroiliitis given SI sclerosis noted on XRays and inflammatory back pain.    Will trial colchicine 1 tablet daily-side effects including liver toxicity, dyspepsia, diarrhea discussed.  She was counseled to obtain CMP 2 weeks after starting given mild elevation in liver enxymesShe was provides small Rx for valium PRN for claustrophobia with MRI.    She will follow up closely.    1. Behcet's disease  - Comprehensive metabolic panel; Future  - C Reactive Protein; Future  - Sedimentation rate (ESR); Future  - colchicine 0.6 MG tablet; Take 1 tablet (0.6 mg total) by mouth daily  Dispense: 30 tablet; Refill: 1    2. Elevated liver enzymes  - Comprehensive metabolic panel; Future  - C Reactive Protein; Future  - Sedimentation rate (ESR); Future    3. Claustrophobia  - diazePAM (Valium) 2 MG tablet; Take 1 tablet (2 mg total) by mouth every 6 (six) hours as needed (for  imaging)  Dispense: 10 tablet; Refill: 0    4. Inflammatory back pain  - MRI Pelvis WO Contrast; Future    5. Sclerosis of sacroiliac joint  - MRI Pelvis WO Contrast; Future         Total Time spent reviewing records, interviewing patient and coordinating plan of care: 34 min    FOLLOW UP:  Return in about 1 month (around 03/12/2021).                                                                                  Debe Coder, MD   Rheumatologist  [address]  [address]   [place], [place] [address]  P [phone]  F [phone]

## 2021-02-12 ENCOUNTER — Encounter (INDEPENDENT_AMBULATORY_CARE_PROVIDER_SITE_OTHER): Payer: Self-pay | Admitting: Obstetrics and Gynecology

## 2021-02-12 ENCOUNTER — Encounter: Payer: Self-pay | Admitting: Obstetrics and Gynecology

## 2021-02-12 ENCOUNTER — Other Ambulatory Visit: Payer: Self-pay | Admitting: Obstetrics and Gynecology

## 2021-02-12 DIAGNOSIS — M352 Behcet's disease: Secondary | ICD-10-CM | POA: Insufficient documentation

## 2021-02-12 NOTE — Progress Notes (Signed)
Spoke w/pt re: ultrasound - stable peritoneal inclusion cyst, and what appear to be bilateral functional ovarian cysts.  Will probably reassess next year.  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  Pt relieved to have a diagnosis.  Reviewed potential gyn sequelae.  Pt appreciative of the call.

## 2021-02-15 ENCOUNTER — Encounter (INDEPENDENT_AMBULATORY_CARE_PROVIDER_SITE_OTHER): Payer: Self-pay | Admitting: Obstetrics and Gynecology

## 2021-02-23 ENCOUNTER — Encounter (INDEPENDENT_AMBULATORY_CARE_PROVIDER_SITE_OTHER): Payer: Self-pay | Admitting: Internal Medicine

## 2021-02-23 ENCOUNTER — Ambulatory Visit
Admission: RE | Admit: 2021-02-23 | Discharge: 2021-02-23 | Disposition: A | Discharge status: Home / Self Care | Payer: BLUE CROSS/BLUE SHIELD | Source: Ambulatory Visit | Attending: Internal Medicine | Admitting: Internal Medicine

## 2021-02-23 ENCOUNTER — Other Ambulatory Visit (INDEPENDENT_AMBULATORY_CARE_PROVIDER_SITE_OTHER): Payer: Self-pay | Admitting: Internal Medicine

## 2021-02-23 DIAGNOSIS — M5489 Other dorsalgia: Secondary | ICD-10-CM

## 2021-02-23 DIAGNOSIS — M533 Sacrococcygeal disorders, not elsewhere classified: Secondary | ICD-10-CM

## 2021-02-24 LAB — COMPREHENSIVE METABOLIC PANEL
ALT: 42 IU/L — ABNORMAL HIGH (ref 0–32)
AST (SGOT): 33 IU/L (ref 0–40)
Albumin/Globulin Ratio: 1.7 (ref 1.2–2.2)
Albumin: 4.5 g/dL (ref 3.8–4.8)
Alkaline Phosphatase: 72 IU/L (ref 44–121)
BUN / Creatinine Ratio: 16 (ref 9–23)
BUN: 12 mg/dL (ref 6–24)
Bilirubin, Total: 0.5 mg/dL (ref 0.0–1.2)
CO2: 20 mmol/L (ref 20–29)
Calcium: 9.8 mg/dL (ref 8.7–10.2)
Chloride: 102 mmol/L (ref 96–106)
Creatinine: 0.75 mg/dL (ref 0.57–1.00)
Globulin, Total: 2.6 g/dL (ref 1.5–4.5)
Glucose: 96 mg/dL (ref 65–99)
Potassium: 4.7 mmol/L (ref 3.5–5.2)
Protein, Total: 7.1 g/dL (ref 6.0–8.5)
Sodium: 138 mmol/L (ref 134–144)
eGFR: 103 mL/min/{1.73_m2} (ref >59)

## 2021-02-24 LAB — SEDIMENTATION RATE: Sed Rate: 6 mm/hr (ref 0–32)

## 2021-02-24 LAB — C-REACTIVE PROTEIN: C-Reactive Protein: <1 mg/L (ref 0–10)

## 2021-03-03 ENCOUNTER — Other Ambulatory Visit (INDEPENDENT_AMBULATORY_CARE_PROVIDER_SITE_OTHER): Payer: Self-pay | Admitting: Internal Medicine

## 2021-03-03 DIAGNOSIS — M352 Behcet's disease: Secondary | ICD-10-CM

## 2021-03-12 ENCOUNTER — Encounter (INDEPENDENT_AMBULATORY_CARE_PROVIDER_SITE_OTHER): Payer: Self-pay | Admitting: Internal Medicine

## 2021-03-12 ENCOUNTER — Telehealth (INDEPENDENT_AMBULATORY_CARE_PROVIDER_SITE_OTHER): Payer: BLUE CROSS/BLUE SHIELD | Admitting: Internal Medicine

## 2021-03-12 DIAGNOSIS — M352 Behcet's disease: Secondary | ICD-10-CM

## 2021-03-12 DIAGNOSIS — G43809 Other migraine, not intractable, without status migrainosus: Secondary | ICD-10-CM

## 2021-03-12 DIAGNOSIS — R748 Abnormal levels of other serum enzymes: Secondary | ICD-10-CM

## 2021-03-12 DIAGNOSIS — K760 Fatty (change of) liver, not elsewhere classified: Secondary | ICD-10-CM

## 2021-03-12 DIAGNOSIS — K120 Recurrent oral aphthae: Secondary | ICD-10-CM

## 2021-03-12 MED ORDER — APREMILAST 10 & 20 & 30 MG PO TBPK
30.0000 mg | ORAL_TABLET | Freq: Two times a day (BID) | ORAL | 2 refills | Status: DC
Start: 2021-03-12 — End: 2021-11-24

## 2021-03-12 NOTE — Patient Instructions (Addendum)
Dear [name] ,     Thanks for arranging a video visit with me.    Here are things I'd like you to do following today's visit:  Please review patient information on Otezla (Apremilast)    You may retrieve any orders from this visit in Mychart by using these steps:    After signing into Mychart  1) click Messaging  2) click Letters  3) select the Requisition  4) Review and click print    Please contact the clinic at [phone] with any questions and to schedule Your follow up visit.    Sincerely,    Debe Coder, MD  Rheumatologist  [facility]  [phone]

## 2021-03-12 NOTE — Progress Notes (Signed)
RHEUMATOLOGY FOLLOW UP TELEMEDICINE NOTE  Telemedicine Documentation Requirements  Originating site (Patient location): home  Distant site (Provider location): Office  Provider and Title: Dr. Adin Hector  Type of Visit: Video Visit  Verbal Consent has been obtained to conduct a telemedicine visit on this date in order to minimize exposure to COVID-19.      PCP: Lurlean Horns, MD    HPI:    This patient is a 40 y.o. year female with Past Medical History of Prior Cholecystectomy, Wold-Parkinson White, Prior Hysterectomy who returns for follow up of FUO-she transitioned from Dr. Pollyann Savoy 02/2020.    HPI:    She had a previous Right hand ganglion cyst removed June 2019 from her index finger.  She says 2-3 days later-she says the salivary gland was swollen.  And developed infection in her salivary gland-she was having dysphagia.  She was seen by ENT and Infectious Disease.  She was treated with 4-5 courses of antibiotics-she had white exudate in her salivary gland-she still feels like she has enlarged cervical lymph nodes.     She reports ongoing Right hand pain-worse with movement-no morning pain or stiffness-no inflammatory arthralgias in hands or feet    She underwent excisional lymph node biopsy revealing for reactive changes.      Interval History:    She started colchicine 1 tablet daily-she feels more energy.  She does have more fatigue if she overdoes it.  She is taking tablet at night.    She says her temperature was better-but not normal.    She has had more migraines since starting the medications.    She has blurry vision intermittently.    She saw the allergist-suspects pathergy from behcets.    MRI Pelvis 02/23/21  IMPRESSION:     1.  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    2.  Stable 3.8 cm cyst adjacent to the vaginal cuff status post  hysterectomy. This is unchanged since 02/03/2020  which favors a benign  finding.    3.  No other abnormality.    Previous Labs:  02/23/21  ESR wnl  CRP wnl  CMP ALT 42, AST normal    CRP wnl  ESR wnl  HLAB51 positive  IgE, IgA, IGG, IgM all wnl  CBC wnl    12/03/20  DIAGNOSIS:       A. Lesion, ulnar wrist, excision:    -Fibroadipose tissue and vasculature       B. Synovial tissue, biopsy:    -Fibroadipose tissue and synovium (see comment)       Comment:    Morphologic features typical of rheumatoid arthritis or infection are    not identified.   12/03/20  Bacterial Cultures: No Growth  Fungal Cultures: NG at one week  AFB: No growth    11/20/20  ESR 50  CRP wnl  IgG subclasses wnl  ANA comprehensive panel negative        Previous Labs:    Recent [facility] Reviewed:  10/23/20  C-ANCA, P-ANCA, MPO, PR3 all negative  Mitochondrial Ab-  ANA IFA-  Scl70-  [employer]re-  RNP-  SMith-  TPPA Ab-      05/18/20  Ferritin 291  CMP AST/A:T 59/70  CBC elevated lymphocytes    02/06/20  MPO-  PR3-  SSA-  aCL IgM 14  aCL IgG, IgA -  Lupus anticoagulant -  B2IgG, IgM -  ACE wnl  CCP-  RF-  Vit D 31  TSH wnl  ESR wnl  CK wnl  C4 wnl  C3 wnl  CRP wnl    07/2019  ANA Screen-  LDH wnl    07/23/2018 lymph node biopsy:DIAGNOSIS:       Lymph node, deep right neck, excisional biopsy:    -Lymph node with non-specific reactive changes   Wound, AFB and Fungal cultures negative at this time    BONE MARROW 05/03/2019    BONE MARROW, LEFT POSTERIOR ILIAC CREST, CORE BIOPSY, CLOT SECTION,   ASPIRATE SMEAR AND TOUCH PREP:     - NORMOCELLULAR BONE MARROW (approximate50-60%) WITH TRILINEAGE HEMATOPOIESIS, NORMAL MYELOID TO ERYTHROID RATIO AND ADEQUATE NUMBERS OF   MEGAKARYOCYTES WIT H UNREMARKABLE MORPHOLOGY     - A FEW SMALL REACTIVE LYMPHOID AGGREGATES IDENTIFIED, AND MILDLY INCREASED NUMBERS OF EOSINOPHILS     - PENDING CYTOGENETIC STUDIES OF BONE MARROW SAMPLE - Remain pending as of 05/14/2019.    COMMENT:   The concurrent flow cytometry performed on the  bone marrow aspirate sample shows no evidence of immunophenotypic abnormalities      Imaging Reviewed:    08/16/20  MRI Brain:IMPRESSION:     1. No intracranial mass, hemorrhage, or hydrocephalus is detected.  2. There is normal pattern of enhancement of the brain parenchyma and  meningeal surfaces.    9/18 MRI Abdomen:  IMPRESSION:      1. Diffuse hepatic steatosis. No suspicious focal hepatic lesions.  2. No biliary ductal dilatation or choledocholithiasis.    02/03/20  MRI Pelvis:  IMPRESSION:        Cystic structure at the vaginal cuff, flank by both ovaries not  significantly changed in size compared to 2019. No abnormal internal  enhancement. This favors a benign process such as a peritoneal inclusion  cyst or a paraovarian cyst.    12/14/19 MRI R Hand:  IMPRESSION:     1.  No suspicious soft tissue mass, osseous mass, or masslike  enhancement in the right hand or wrist to correlate with the diffuse FDG  uptake on recent PET/CT.  2.  There is mild enhancing synovitis in the right wrist joint without  active joint erosion or bony destruction.  3.  A small dorsal ganglion cyst overlying the scapholunate interval.    12/06/19  Full Body PET Scan:    Hypermetabolic soft tissue mass in palmar R hand surface at base of the second digit-stable pulmonary nodulke 1.3 cm left lower lobe-recommend follow up Chest CT 3 months        The following sections were reviewed this encounter by the provider:   Tobacco  Allergies  Meds  Problems  Med Hx  Surg Hx  Fam Hx           PMH/PSH:  Past Medical History:   Diagnosis Date   . Abnormal Pap smear of cervix    . Anemia ~12    Pre-hysterectomy   . Behcet's disease    . BMI 45.0-49.9, adult    . Breast lump    . Diarrhea     occasional   . Fever     for past ~ 3 months (avg daily fever 100-101)   . Gastroesophageal reflux disease    . Headache    . Herpes simplex virus (HSV) infection    . Meningitis    . Post-operative nausea and vomiting    . Scoliosis    . Swollen  lymph nodes  Past Surgical History:   Procedure Laterality Date   . ablasion  2005   . ARTHROTOMY, WRIST Right 12/03/2020    Procedure: RIGHT UPPER EXTREMITY SYNOVIAL BIOPSY;  Surgeon: Kellie Simmering, MD;  Location: [facility];  Service: Plastics;  Laterality: Right;   . BIOPSY, LYMPH NODE N/A 07/23/2018    Procedure: BIOPSY, LYMPH NODE;  Surgeon: Shelda Pal, MD;  Location: [facility];  Service: ENT;  Laterality: N/A;  NECK DEEP LYMPH NODE BIOPSY   . BONE MARROW BIOPSY  2020   . CHOLECYSTECTOMY  2009   . D&C DIAGNOSTIC  2010   . DILATION AND CURETTAGE OF UTERUS  2010   . FINGER GANGLION CYST EXCISION  04/2018   . GANGLION CYST EXCISION  2014    04/2018 cyst removed from index finger-[facility]   . HERNIA REPAIR  2016   . HYSTERECTOMY  2012   . PELVIC LAPAROSCOPY  2011   . UMBILICAL HERNIA REPAIR  2016   . UPPER GASTROINTESTINAL ENDOSCOPY  2009   . WISDOM TOOTH EXTRACTION  1998        FH/SH:  Family History   Adopted: Yes       Social History     Tobacco Use   . Smoking status: Never Smoker   . Smokeless tobacco: Never Used   Vaping Use   . Vaping Use: Never used   Substance Use Topics   . Alcohol use: Not Currently     Alcohol/week: 0.0 standard drinks     Comment: rarely   . Drug use: Never        Meds/ Allergies:  No outpatient medications have been marked as taking for the 03/12/21 encounter (Telemedicine Visit) with Danella Deis, MD.     Allergies   Allergen Reactions   . Fruit Blend Flavor [Flavoring Agent] Anaphylaxis and Swelling   . Adhesive [Wound Dressing Adhesive]      dermabond   . Latex Other (See Comments), Hives and Swelling     Swelling and redness throughout body   Also severe allergy to surgical glue   . Oxycodone Other (See Comments)     Hallucinations   . Other Itching and Rash     Tropical fruits like coconuts, pineapple, kiwi, etc.          PHYSICAL EXAM  General- WNWD, alert and oriented, NAD  Eyes- EOMI, PERRL, no conjunctival injection, no scleral  icterus  ENMT- face symmetric without lesions  Skin- no rash, no alopecia  Pulm: No conversational dyspnea, breathing comfortable on room air  Neuro - no notable neurological deficits, no facial asymmetry    .        LABS:  Lab Results   Component Value Date    WBC 8.0 01/25/2021    HGB 14.9 01/25/2021    HCT 45.4 01/25/2021    MCV 93 01/25/2021    PLT 407 01/25/2021      Lab Results   Component Value Date    CREAT 0.75 02/23/2021    BUN 12 02/23/2021    NA 138 02/23/2021    K 4.7 02/23/2021    CL 102 02/23/2021    CO2 20 02/23/2021      Lab Results   Component Value Date    ALT 42 (H) 02/23/2021    AST 33 02/23/2021    ALKPHOS 72 02/23/2021    BILITOTAL 0.5 02/23/2021     Lab Results   Component Value Date  ESR 6 02/23/2021    ESR 7 01/25/2021    ESR 50 (H) 11/20/2020    ESR 11 02/06/2020    ESR 9 07/26/2019      Lab Results   Component Value Date    CRP <1 02/23/2021    CRP 2 01/25/2021    CRP 3 11/20/2020    CRP 0.2 02/06/2020      No results found for: ANA   Lab Results   Component Value Date    RHEUMFACTOR <15.0 02/06/2020      Lab Results   Component Value Date    CCP <15.6 02/06/2020     No results found for: URICACID        ASSESSMENT/PLAN:    This patient is a 40 y.o. year female with Past Medical History of Prior Cholecystectomy, Wolf-Parkinson White, Prior Hysterectomy, Fatty Liver Disease who returns for follow up of Behcet's Disease.    Some improvement in symptoms with colchicine-still with fevers and oral ulcers.  Will try Otezla-methotrexate not ideal given fatty liver disease-though recent labs improved.  Side effects including diarrhea and depression discussed.    1. Behcet's disease  - Apremilast 10 & 20 & 30 MG Tablet Therapy Pack; Take 30 mg by mouth 2 (two) times daily  Dispense: 1 each; Refill: 2  - Ambulatory referral to Neurology    2. Aphthous ulcer  - Apremilast 10 & 20 & 30 MG Tablet Therapy Pack; Take 30 mg by mouth 2 (two) times daily  Dispense: 1 each; Refill: 2    3. Elevated liver  enzymes  - Apremilast 10 & 20 & 30 MG Tablet Therapy Pack; Take 30 mg by mouth 2 (two) times daily  Dispense: 1 each; Refill: 2    4. Fatty liver disease, nonalcoholic  - Apremilast 10 & 20 & 30 MG Tablet Therapy Pack; Take 30 mg by mouth 2 (two) times daily  Dispense: 1 each; Refill: 2    5. Other migraine without status migrainosus, not intractable  - Ambulatory referral to Neurology         Total Time spent reviewing records, interviewing patient and coordinating plan of care: 34 min    FOLLOW UP:  Return in about 4 weeks (around 04/09/2021) for Follow Up, 4-6 weeks video OK.                                                                                  Debe Coder, MD   Rheumatologist  [address]  [address]   [place], [place] [address]  P [phone]  F [phone]

## 2021-03-16 ENCOUNTER — Telehealth: Payer: Self-pay

## 2021-03-16 NOTE — Telephone Encounter (Signed)
PA Status Update     PA Required :    Drug: OTEZLA 10 & 20 & 30 MG Tablet Therapy Pack        Status:     PA Required

## 2021-03-17 NOTE — Telephone Encounter (Signed)
PA Status Update     PA Approved til 03/16/2022     Drug:OTEZLA 10 & 20 & 30 MG Tablet Therapy Pack        Status:   [id number]    Update liaison

## 2021-03-18 NOTE — Telephone Encounter (Signed)
Hailey [name] with patient regarding one time fill with [facility] vs filling with preferred pharmacy [facility]. She opted [facility] with [facility]. Signed patient up for Co-pay assistance with consent.   Patient signed up with $0 co-pay card active for 12 months. E-mail received by patient with co-pay card information:     ID [id number]  BIN: [id number]  Group: [id number]  PCN: [id number]    Informed patient she can call me back with any issues regarding Otezla Rx. Releasing Rx to [facility] for fill.

## 2021-03-18 NOTE — Telephone Encounter (Signed)
Awesome thank you!

## 2021-03-22 NOTE — Telephone Encounter (Signed)
[facility] [place]- Received voicemail from patient regarding Rx clarification needed per [facility]. Called [facility] [phone] spoke with Ethelene Browns B. He stated the Rx needed clarification regarding starter pack sig and maintenance dose sig thereafter.     Clarified dose for starter pack and provided verbal prescription for maintenance dose, 90 day supply with 0 refills. Called patient to inform her of changes and make sure she still has the co-pay card info for when she has delivery set up.

## 2021-03-24 ENCOUNTER — Encounter (INDEPENDENT_AMBULATORY_CARE_PROVIDER_SITE_OTHER): Payer: Self-pay | Admitting: Internal Medicine

## 2021-03-27 ENCOUNTER — Other Ambulatory Visit (INDEPENDENT_AMBULATORY_CARE_PROVIDER_SITE_OTHER): Payer: Self-pay | Admitting: Internal Medicine

## 2021-03-27 DIAGNOSIS — M352 Behcet's disease: Secondary | ICD-10-CM

## 2021-03-30 ENCOUNTER — Other Ambulatory Visit (INDEPENDENT_AMBULATORY_CARE_PROVIDER_SITE_OTHER): Payer: Self-pay | Admitting: Internal Medicine

## 2021-03-30 DIAGNOSIS — M352 Behcet's disease: Secondary | ICD-10-CM

## 2021-04-12 ENCOUNTER — Telehealth (INDEPENDENT_AMBULATORY_CARE_PROVIDER_SITE_OTHER): Payer: BLUE CROSS/BLUE SHIELD | Admitting: Internal Medicine

## 2021-04-12 ENCOUNTER — Encounter (INDEPENDENT_AMBULATORY_CARE_PROVIDER_SITE_OTHER): Payer: Self-pay | Admitting: Internal Medicine

## 2021-04-12 DIAGNOSIS — K120 Recurrent oral aphthae: Secondary | ICD-10-CM

## 2021-04-12 DIAGNOSIS — M352 Behcet's disease: Secondary | ICD-10-CM

## 2021-04-12 DIAGNOSIS — R110 Nausea: Secondary | ICD-10-CM

## 2021-04-12 MED ORDER — ONDANSETRON 4 MG PO TBDP
4.0000 mg | ORAL_TABLET | Freq: Three times a day (TID) | ORAL | 0 refills | Status: DC | PRN
Start: 2021-04-12 — End: 2022-01-14

## 2021-04-12 NOTE — Progress Notes (Signed)
RHEUMATOLOGY FOLLOW UP TELEMEDICINE NOTE  Telemedicine Documentation Requirements  Originating site (Patient location): home  Distant site (Provider location): Office  Provider and Title: Dr. Adin Hector  Type of Visit: Video Visit  Verbal Consent has been obtained to conduct a telemedicine visit on this date in order to minimize exposure to COVID-19.      PCP: Lurlean Horns, MD    HPI:    This patient is a 40 y.o. 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    HPI:    She had a previous Right hand ganglion cyst removed June 2019 from her index finger.  She says 2-3 days later-she says the salivary gland was swollen.  And developed infection in her salivary gland-she was having dysphagia.  She was seen by ENT and Infectious Disease.  She was treated with 4-5 courses of antibiotics-she had white exudate in her salivary gland-she still feels like she has enlarged cervical lymph nodes.     She reports ongoing Right hand pain-worse with movement-no morning pain or stiffness-no inflammatory arthralgias in hands or feet    She underwent excisional lymph node biopsy revealing for reactive changes.      Interval History:    She stopped taking colchicine.  She started Otezla-soing well except for diarrhea and anusea the last 2 days.  She would like to continue taking for now.  No oral ulcers.  Fevers slightly less.  Joint Pain improved.  She has still had intermittent headaches-but less so than Colchicine.    She has questions about vitamins or supplements which would be helpful.      Previous Labs:  02/23/21  ESR wnl  CRP wnl  CMP ALT 42, AST normal    CRP wnl  ESR wnl  HLAB51 positive  IgE, IgA, IGG, IgM all wnl  CBC wnl    12/03/20  DIAGNOSIS:       A. Lesion, ulnar wrist, excision:    -Fibroadipose tissue and vasculature       B. Synovial tissue, biopsy:     -Fibroadipose tissue and synovium (see comment)       Comment:    Morphologic features typical of rheumatoid arthritis or infection are    not identified.   12/03/20  Bacterial Cultures: No Growth  Fungal Cultures: NG at one week  AFB: No growth    11/20/20  ESR 50  CRP wnl  IgG subclasses wnl  ANA comprehensive panel negative        Previous Labs:    Recent [facility] Reviewed:  10/23/20  C-ANCA, P-ANCA, MPO, PR3 all negative  Mitochondrial Ab-  ANA IFA-  Scl70-  Centromere-  RNP-  SMith-  TPPA Ab-      05/18/20  Ferritin 291  CMP AST/A:T 59/70  CBC elevated lymphocytes    02/06/20  MPO-  PR3-  SSA-  aCL IgM 14  aCL IgG, IgA -  Lupus anticoagulant -  B2IgG, IgM -  ACE wnl  CCP-  RF-  Vit D 31  TSH wnl  ESR wnl  CK wnl  C4 wnl  C3 wnl  CRP wnl    07/2019  ANA Screen-  LDH wnl    07/23/2018 lymph node biopsy:DIAGNOSIS:       Lymph node, deep right neck, excisional biopsy:    -Lymph node with non-specific reactive changes   Wound,  AFB and Fungal cultures negative at this time    BONE MARROW 05/03/2019    BONE MARROW, LEFT POSTERIOR ILIAC CREST, CORE BIOPSY, CLOT SECTION,   ASPIRATE SMEAR AND TOUCH PREP:     - NORMOCELLULAR BONE MARROW (approximate50-60%) WITH TRILINEAGE HEMATOPOIESIS, NORMAL MYELOID TO ERYTHROID RATIO AND ADEQUATE NUMBERS OF   MEGAKARYOCYTES WIT H UNREMARKABLE MORPHOLOGY     - A FEW SMALL REACTIVE LYMPHOID AGGREGATES IDENTIFIED, AND MILDLY INCREASED NUMBERS OF EOSINOPHILS     - PENDING CYTOGENETIC STUDIES OF BONE MARROW SAMPLE - Remain pending as of 05/14/2019.    COMMENT:   The concurrent flow cytometry performed on the bone marrow aspirate sample shows no evidence of immunophenotypic abnormalities      Imaging Reviewed:    08/16/20  MRI Brain:IMPRESSION:     1. No intracranial mass, hemorrhage, or hydrocephalus is detected.  2. There is normal pattern of enhancement of the brain parenchyma and  meningeal surfaces.    9/18 MRI Abdomen:  IMPRESSION:      1. Diffuse  hepatic steatosis. No suspicious focal hepatic lesions.  2. No biliary ductal dilatation or choledocholithiasis.    02/03/20  MRI Pelvis:  IMPRESSION:        Cystic structure at the vaginal cuff, flank by both ovaries not  significantly changed in size compared to 2019. No abnormal internal  enhancement. This favors a benign process such as a peritoneal inclusion  cyst or a paraovarian cyst.    12/14/19 MRI R Hand:  IMPRESSION:     1.  No suspicious soft tissue mass, osseous mass, or masslike  enhancement in the right hand or wrist to correlate with the diffuse FDG  uptake on recent PET/CT.  2.  There is mild enhancing synovitis in the right wrist joint without  active joint erosion or bony destruction.  3.  A small dorsal ganglion cyst overlying the scapholunate interval.    12/06/19  Full Body PET Scan:    Hypermetabolic soft tissue mass in palmar R hand surface at base of the second digit-stable pulmonary nodulke 1.3 cm left lower lobe-recommend follow up Chest CT 3 months        The following sections were reviewed this encounter by the provider:   Tobacco   Allergies   Meds   Problems   Med Hx   Surg Hx   Fam Hx            PMH/PSH:  Past Medical History:   Diagnosis Date    Abnormal Pap smear of cervix     Anemia ~12    Pre-hysterectomy    Behcet's disease     BMI 45.0-49.9, adult     Breast lump     Diarrhea     occasional    Fever     for past ~ 3 months (avg daily fever 100-101)    Gastroesophageal reflux disease     Headache     Herpes simplex virus (HSV) infection     Meningitis     Post-operative nausea and vomiting     Scoliosis     Swollen lymph nodes         Past Surgical History:   Procedure Laterality Date    ablasion  2005    ARTHROTOMY, WRIST Right 12/03/2020    Procedure: RIGHT UPPER EXTREMITY SYNOVIAL BIOPSY;  Surgeon: Kellie Simmering, MD;  Location: [facility];  Service: Plastics;  Laterality: Right;    BIOPSY, LYMPH NODE N/A 07/23/2018  Procedure: BIOPSY, LYMPH NODE;   Surgeon: Shelda Pal, MD;  Location: [id number];  Service: ENT;  Laterality: N/A;  NECK DEEP LYMPH NODE BIOPSY    BONE MARROW BIOPSY  2020    CHOLECYSTECTOMY  2009    D&C DIAGNOSTIC  2010    DILATION AND CURETTAGE OF UTERUS  2010    FINGER GANGLION CYST EXCISION  04/2018    GANGLION CYST EXCISION  2014    04/2018 cyst removed from index finger-[facility]    HERNIA REPAIR  2016    HYSTERECTOMY  2012    PELVIC LAPAROSCOPY  2011    UMBILICAL HERNIA REPAIR  2016    UPPER GASTROINTESTINAL ENDOSCOPY  2009    WISDOM TOOTH EXTRACTION  1998        FH/SH:  Family History   Adopted: Yes       Social History     Tobacco Use    Smoking status: Never Smoker    Smokeless tobacco: Never Used   Vaping Use    Vaping Use: Never used   Substance Use Topics    Alcohol use: Not Currently     Alcohol/week: 0.0 standard drinks     Comment: rarely    Drug use: Never        Meds/ Allergies:  No outpatient medications have been marked as taking for the 04/12/21 encounter (Telemedicine Visit) with Danella Deis, MD.     Allergies   Allergen Reactions    Fruit Blend Flavor [Flavoring Agent] Anaphylaxis and Swelling    Adhesive [Wound Dressing Adhesive]      dermabond    Latex Other (See Comments), Hives and Swelling     Swelling and redness throughout body   Also severe allergy to surgical glue    Oxycodone Other (See Comments)     Hallucinations    Other Itching and Rash     Tropical fruits like coconuts, pineapple, kiwi, etc.          PHYSICAL EXAM  General- WNWD, alert and oriented, NAD  Eyes- EOMI, PERRL, no conjunctival injection, no scleral icterus  ENMT- face symmetric without lesions  Skin- no rash, no alopecia  Pulm: No conversational dyspnea, breathing comfortable on room air  Neuro - no notable neurological deficits, no facial asymmetry    .        LABS:  Lab Results   Component Value Date    WBC 8.0 01/25/2021    HGB 14.9 01/25/2021    HCT 45.4 01/25/2021    MCV 93 01/25/2021    PLT  407 01/25/2021      Lab Results   Component Value Date    CREAT 0.75 02/23/2021    BUN 12 02/23/2021    NA 138 02/23/2021    K 4.7 02/23/2021    CL 102 02/23/2021    CO2 20 02/23/2021      Lab Results   Component Value Date    ALT 42 (H) 02/23/2021    AST 33 02/23/2021    ALKPHOS 72 02/23/2021    BILITOTAL 0.5 02/23/2021     Lab Results   Component Value Date    ESR 6 02/23/2021    ESR 7 01/25/2021    ESR 50 (H) 11/20/2020    ESR 11 02/06/2020    ESR 9 07/26/2019      Lab Results   Component Value Date    CRP <1 02/23/2021    CRP 2 01/25/2021  CRP 3 11/20/2020    CRP 0.2 02/06/2020      No results found for: ANA   Lab Results   Component Value Date    RHEUMFACTOR <15.0 02/06/2020      Lab Results   Component Value Date    CCP <15.6 02/06/2020     No results found for: URICACID        ASSESSMENT/PLAN:    This patient is a 40 y.o. year female with Past Medical History of Prior Cholecystectomy, Wolf-Parkinson White, Prior Hysterectomy, Fatty Liver Disease who returns for follow up of Behcet's Disease.    Started Otezla-some improvement, having nausea and diarrhea-she will continue.  Will provide short term zofran PRN.    Will check Vitamin D level with next round of labs  In addition to CMP, inflammatory markers.    1. Behcet's disease  - ondansetron (ZOFRAN-ODT) 4 MG disintegrating tablet; Take 1 tablet (4 mg total) by mouth every 8 (eight) hours as needed for Nausea  Dispense: 30 tablet; Refill: 0    2. Aphthous ulcer  - ondansetron (ZOFRAN-ODT) 4 MG disintegrating tablet; Take 1 tablet (4 mg total) by mouth every 8 (eight) hours as needed for Nausea  Dispense: 30 tablet; Refill: 0    3. Nausea  - ondansetron (ZOFRAN-ODT) 4 MG disintegrating tablet; Take 1 tablet (4 mg total) by mouth every 8 (eight) hours as needed for Nausea  Dispense: 30 tablet; Refill: 0         Total Time spent reviewing records, interviewing patient and coordinating plan of care: 26 min    FOLLOW UP:  Return in about 4 weeks (around 05/10/2021)  for Follow Up, video OK.                                                                                  Debe Coder, MD   Rheumatologist  [address]  [address]   [place], [place] [address]  P [phone]  F [phone]

## 2021-04-12 NOTE — Patient Instructions (Signed)
Dear Hailey [name] ,     Thanks for arranging a video visit with me.    Here are things I'd like you to do following today's visit:  Medication instructions:  Please take Zofran PRN    You may retrieve any orders from this visit in Mychart by using these steps:    After signing into Mychart  1) click Messaging  2) click Letters  3) select the Requisition  4) Review and click print    Please contact the clinic at [phone] with any questions and to schedule your follow up visit.    Sincerely,    Debe Coder, MD  Rheumatologist  [facility]  [phone]

## 2021-04-13 ENCOUNTER — Ambulatory Visit (INDEPENDENT_AMBULATORY_CARE_PROVIDER_SITE_OTHER): Payer: BLUE CROSS/BLUE SHIELD | Admitting: Neurology

## 2021-04-13 ENCOUNTER — Telehealth (INDEPENDENT_AMBULATORY_CARE_PROVIDER_SITE_OTHER): Payer: Self-pay

## 2021-04-13 ENCOUNTER — Encounter (INDEPENDENT_AMBULATORY_CARE_PROVIDER_SITE_OTHER): Payer: Self-pay | Admitting: Neurology

## 2021-04-13 VITALS — BP 126/72 | HR 52 | Ht 68.0 in | Wt 314.0 lb

## 2021-04-13 DIAGNOSIS — H539 Unspecified visual disturbance: Secondary | ICD-10-CM

## 2021-04-13 DIAGNOSIS — G43109 Migraine with aura, not intractable, without status migrainosus: Secondary | ICD-10-CM

## 2021-04-13 DIAGNOSIS — R292 Abnormal reflex: Secondary | ICD-10-CM

## 2021-04-13 DIAGNOSIS — R42 Dizziness and giddiness: Secondary | ICD-10-CM

## 2021-04-13 DIAGNOSIS — M352 Behcet's disease: Secondary | ICD-10-CM

## 2021-04-13 MED ORDER — EMGALITY 120 MG/ML SC SOAJ
SUBCUTANEOUS | 11 refills | Status: DC
Start: 2021-04-13 — End: 2022-01-14

## 2021-04-13 MED ORDER — LORAZEPAM 2 MG PO TABS
ORAL_TABLET | ORAL | 0 refills | Status: DC
Start: 2021-04-13 — End: 2021-12-02

## 2021-04-13 MED ORDER — RIZATRIPTAN BENZOATE 10 MG PO TABS
10.0000 mg | ORAL_TABLET | Freq: Once | ORAL | 3 refills | Status: DC | PRN
Start: 2021-04-13 — End: 2021-06-14

## 2021-04-13 NOTE — Telephone Encounter (Signed)
Request received and beginning investigation     Emgality 120 mg/ml pen    Inject 120 mg/ml into the skin every 30 days    PA Required

## 2021-04-13 NOTE — Patient Instructions (Addendum)
IMAGING  Do not go to any other MRI facility, no matter what your insurance says. Call us if there are any issues.     [facility]) Radiology  [email or web address]  [phone]        Lora Havens, MD PHD  Director, Neuroimmunology & MS Center  [facility]    [address], [address], [place], [place] [address]  Main line / after-hours on-call: [phone]      For direct access, contact Gaylyn Cheers RN  Tel [phone]   Fax [phone]   ============  Do not take abortives more than 2-3 days a week combined, as these can worsen headaches (rebound headache). These include over-the-counter pain meds, triptans. Tramadol & narcotics worsen headaches if used even once a week.    The following supplements can help some patients:  Magnesium Oxide 400mg  twice a day  Coenzyme Q10 (CoQ10) 100mg  twice a day  Riboflavin 100mg  twice a day  Vitamin B12 1000 mcg once a day      Lora Havens, MD PHD  Director, Neuroimmunology & MS Center  [facility]    [address], [address], [place], [place] [address]  Main line / after-hours on-call: [phone]      For direct access, contact Gaylyn Cheers RN or Allayne Stack RN  Tel [phone]   Fax [phone]   ===================

## 2021-04-13 NOTE — Progress Notes (Signed)
[facility]  MULTIPLE SCLEROSIS & NEUROIMMUNOLOGY CENTER  Main Line / Scheduling / on-call: [phone]  Clinic Tel: [phone]  Fax: [phone]  Send [place] a mychart or inbasket message for the fastest response  ~~~~~~~~~~~~~~~~~~~~~~~~~~~~~~~~~~~~~~~~~~~~~~~~~~~~~~~~~~~~~~~~~~~~~~~~~~~~~~~~~~~~    IN-PERSON    CC: Behcet's ,r/o neuro behcet's    HPI:   History was obtained from patient,     40 y.o. year-old female who was diagnosed with Behcet's on 02/08/21. She has 20+ year of history of oral ulcers. In 04/2018 developed ganglion cyst in her hands and sialiadenitis. Then developed recurrent unexplained fevers, lymphadenopathy and rheumatologic symptoms. She underwent extensive infectious disease testing at [facility]. She was on cochicine, but now on otzela.    She is referred for headaches. She has hx perimenstrual headaches and eventually full migraines after a concussion in college. Her baseline frequency is 1-3/month, helped with maxalt. She tried topamax with intolerance. She was previously on a b-blocker. She had increased frequency with colchicine, 5-10/month. She just started otzela 03/25/21 and has had 4 headaches in 3 weeks. However, she has visual aura or dizzy every other day.     She has hx wolf-parkinson-white s/p cardiac ablation. EKG 12/03/20 was normal.     Review of Previous records reveals:  Presented 02/06/20 to rheumatology Dr Pollyann Savoy with unexplained fever, sicca, arthralgia /synovitis bone marrow bx w/ eosinophillic predominance. She had a PET-positive soft tissue lesion on her hand and 13mm lung lesion  Diagnosed with Behcet's on 02/08/21 based on +HLA-B51 genetic testing.    "Patient with positive HLAB51 genetic test-highly specific for Behcets-she has manifested recurrent oral ulcers in the past, questionable uveitis and has recently had pathergy to surgical wound. "      EXAM  Visit Vitals  BP 126/72 (BP Site: Left arm, Patient Position: Sitting, Cuff Size: Large)   Pulse (!) 52    Ht 1.727 m (5\' 8" )   Wt 142.4 kg (314 lb)   LMP  (LMP Unknown)   BMI 47.74 kg/m     General:   Fundoscopic: normal  Psychiatric.   Mental Status: The patient was awake, alert, appropriate  Cranial Nerves: CN II-XII wnl  Motor: 5/5  Reflex: 3+ arms, +right hoffman  Sensation: reduced right hand  Coordination: ok  Gait: ok      Imaging (my summary based on personal review of images). Reviewed in Epic.   MRI brain normal; though when I reviewed images there might be a slight degree of leptomeningeal enhancement. (08/13/20).     Testing (EMG/NCS, EKG, EEG, Echo, Evoked potentials) - Reviewed in Epic.       Labs. Reviewed in epic. See below  Lab Results   Component Value Date    HGBA1C 5.7 (H) 09/15/2017    GLU 96 02/23/2021    EGFR 103 02/23/2021     Lab Results   Component Value Date    VITD 31 02/06/2020    CREAT 0.75 02/23/2021    WBC 8.0 01/25/2021        ASSESSMENT / PLAN  RTC: Return in about 2 months (around 06/13/2021) for [place].    1. Behcet's disease    2. Migraine with aura and without status migrainosus, not intractable    3. Hyperreflexia    4. Vision changes    5. Dizziness       40 year old woman who was recently diagnosed with Behcet's based upon genetic (HLA-DR) testing, with a long history of oral ulcers but then 3 years  of unexplained fevers, synovitis, arthralgias.    Neurologically, she complains of migraine headaches, with episodic vision changes and dizziness.  Although she has a history of migraines, the frequency seems to be increasing.  Further, I do notice some suggestion of abnormal hyperreflexia especially in the arms and she complains of sensory changes in the arms more than legs.  All of these raise concern for meningeal or parenchymal cord involvement secondary to Behcet's disease.  If neurologic Behcet's is identified, this will likely impact the choice of immunosuppression as she will require immunosuppressive agent that targets the CNS.  The first step in identification of CNS  inflammatory disease is an MRI.  Spinal tap can be considered, but is neither sensitive nor specific.  Conversely, Behcet's disease is known to have a predilection for the posterior fossa and knee meninges.    I looked at the MRI images from October 2021.  At that time, the diagnosis of Behcet's was not known and therefore not reported to the radiologist.  I am wondering if there is a slight degree of leptomeningeal enhancement especially in the cerebellum that might of been overlooked by the radiologist, perhaps because none of [place] were aware of the Behcet's diagnosis.  I will discuss this with Dr. Jonna Munro.  We will also repeat the MRI since her neurologic symptoms have been worsening.    She has history of Wolff-Parkinson-White, which is stabilized status post ablation surgery.  She had a recent EKG that was normal.  Therefore, I do not have significant concern with her using triptan for control of migraines    Plan:  Start emgality; continue rizatriptan  MRI b/c/t/l +/- gad to evaluate for CNS behcet's  Agree w/ otzela  If new neuro sx develop or h/a remains intractable, can consider LP but will not pursue right now.    Orders today (details below)  Orders Placed This Encounter   Procedures    MRI Brain W WO Contrast    MRI Cervical Spine W WO Contrast    MRI Thoracic Spine W WO Contrast    MRI Lumbar Spine W WO Contrast     Orders Placed This Encounter   Medications    Galcanezumab-gnlm (Emgality) 120 MG/ML Solution Auto-injector     Sig: Inject 240 mg into the skin every 30 (thirty) days for 30 days, THEN 120 mg every 30 (thirty) days.     Dispense:  4 each     Refill:  11    rizatriptan (MAXALT) 10 MG tablet     Sig: Take 1-2 tablets (10-20 mg total) by mouth once as needed for Migraine Max 3 days a week     Dispense:  36 tablet     Refill:  3    LORazepam (ATIVAN) 2 MG tablet     Sig: 1 Tab half-hour prior to each MRI and another tab immediately before. May cause sedation - do not drive.     Dispense:   8 tablet     Refill:  0     I spent 40 minutes reviewing the records, talking with the patient and developing their care plan.      Lora Havens, MD PHD  Director, Neuroimmunology & MS Center  [facility]    [address], [place], [place] [address]  Main line / after-hours on-call: [phone]      For direct access, contact Gaylyn Cheers RN or Allayne Stack RN  Tel [phone]   Fax [phone]       PATIENT INSTRUCTIONS PROVIDED  Patient was given an "After Visit Summary" with a copy of the testing orders, medications and the following other instructions:  Patient Instructions   IMAGING  Do not go to any other MRI facility, no matter what your insurance says. Call us if there are any issues.     [facility]) Radiology  [email or web address]  [phone]        Lora Havens, MD PHD  Director, Neuroimmunology & MS Center  [facility]    [address], [address], [place], [place] [address]  Main line / after-hours on-call: [phone]      For direct access, contact Gaylyn Cheers RN  Tel [phone]   Fax [phone]   ============  Do not take abortives more than 2-3 days a week combined, as these can worsen headaches (rebound headache). These include over-the-counter pain meds, triptans. Tramadol & narcotics worsen headaches if used even once a week.    The following supplements can help some patients:  Magnesium Oxide 400mg  twice a day  Coenzyme Q10 (CoQ10) 100mg  twice a day  Riboflavin 100mg  twice a day  Vitamin B12 1000 mcg once a day      Lora Havens, MD PHD  Director, Neuroimmunology & MS Center  [facility]    [address], [address], [place], [place] [address]  Main line / after-hours on-call: [phone]      For direct access, contact Gaylyn Cheers RN or Allayne Stack  RN  Tel [phone]   Fax [phone]   ===================        REFERRAL / CARE COORDINATION  see communication section, referrals section, EPIC notes  Referring MD:No referring provider defined for this encounter.  Fax:   Primary Care on file: Tyler Aas, [place]  Tel: [phone]   Fax: [phone]  Care Team:Patient Care Team:  Tyler Aas, [place] as PCP - General (Physician Assistant)  Manfred Arch D  Provider, Generic Hpf (Inactive)  Tyler Aas, [place]  Tivis Ringer, MD as Consulting Physician (Hematology)  Lu Duffel, MD as Consulting Physician (Obstetrics and Gynecology)    IMPORTED INFORMATION FROM MEDICAL RECORDS    ICD-10-CM    1. Behcet's disease  M35.2 MRI Brain W WO Contrast     MRI Cervical Spine W WO Contrast     MRI Thoracic Spine W WO Contrast     MRI Lumbar Spine W WO Contrast   2. Migraine with aura and without status migrainosus, not intractable  G43.109 MRI Brain W WO Contrast     MRI Cervical Spine W WO Contrast     MRI Thoracic Spine W WO Contrast     rizatriptan (MAXALT) 10 MG tablet     MRI Lumbar Spine W WO Contrast   3. Hyperreflexia  R29.2 MRI Brain W WO Contrast     MRI Cervical Spine W WO Contrast     MRI Thoracic Spine W WO Contrast     MRI Lumbar Spine W WO Contrast   4. Vision changes  H53.9    5. Dizziness  R42      Orders Placed This Encounter   Procedures    MRI Brain W WO Contrast     Standing Status:   Future     Standing Expiration Date:   04/13/2022  Scheduling Instructions:      [facility]) Radiology only ([email or web address] [phone])     Order Specific Question:   Does the patient have a pacemaker or defibrillator?     Answer:   No     Order Specific Question:   What is the patient's sedation requirement?     Answer:   No Sedation     Order Specific Question:   Is the patient pregnant?     Answer:   No     Order Specific Question:   Clinical info for radiologist     Answer:   MS eval - 3T MRI     Order Specific Question:    Release to patient     Answer:   Immediate    MRI Cervical Spine W WO Contrast     Standing Status:   Future     Standing Expiration Date:   04/13/2022     Scheduling Instructions:      MRI to be scheduled at [facility]) Radiology ([email or web address] [phone]), or [place] [facility] at [facility] or [place] only. Do not schedule at any other [place] location..     Order Specific Question:   Does the patient have a pacemaker or defibrillator?     Answer:   No     Order Specific Question:   What is the patient's sedation requirement?     Answer:   No Sedation     Order Specific Question:   Is the patient pregnant?     Answer:   No     Order Specific Question:   Clinical info for radiologist     Answer:   Multiple sclerosis/demyelination - 3T scanner     Order Specific Question:   Release to patient     Answer:   Immediate    MRI Thoracic Spine W WO Contrast     Standing Status:   Future     Standing Expiration Date:   04/13/2022     Scheduling Instructions:      [facility]) Radiology only ([email or web address] [phone])     Order Specific Question:   Does the patient have a pacemaker or defibrillator?     Answer:   No     Order Specific Question:   What is the patient's sedation requirement?     Answer:   No Sedation     Order Specific Question:   Is the patient pregnant?     Answer:   No     Order Specific Question:   Clinical info for radiologist     Answer:   MS/demyelination eval - 3T scanner     Order Specific Question:   Release to patient     Answer:   Immediate    MRI Lumbar Spine W WO Contrast     Standing Status:   Future     Standing Expiration Date:   04/13/2022     Order Specific Question:   Does the patient have a pacemaker or defibrillator?     Answer:   No     Order Specific Question:   What is the patient's sedation requirement?     Answer:   No Sedation     Order Specific Question:   Is the patient pregnant?     Answer:   No     Order Specific Question:    Clinical info for radiologist     Answer:   hx behcet's. headaches. mild UE hyperreflexia. screen for meningeal or  parenchymal involvement.     Order Specific Question:   Release to patient     Answer:   Immediate

## 2021-04-13 NOTE — Telephone Encounter (Signed)
Requesting prior authorization for Emgality 120 mg/ml auto-injector     PA Outcome APPROVED    Quantity of 2 for a day supply of 30     Insurance BCBS FEP    Reference #? [id number]    Dates in effect, from 03/14/2021 through 10/10/2021    Pharmacy and phone number pending patient decision. Eligible for financial assistance and to fill with [facility].    $120.78 copay, $0 with copay card.

## 2021-04-19 ENCOUNTER — Encounter (INDEPENDENT_AMBULATORY_CARE_PROVIDER_SITE_OTHER): Payer: Self-pay | Admitting: Physician Assistant

## 2021-04-19 ENCOUNTER — Other Ambulatory Visit: Payer: Self-pay

## 2021-04-19 ENCOUNTER — Ambulatory Visit (INDEPENDENT_AMBULATORY_CARE_PROVIDER_SITE_OTHER): Payer: BLUE CROSS/BLUE SHIELD | Admitting: Physician Assistant

## 2021-04-19 VITALS — BP 122/80 | HR 52 | Temp 99.2°F | Resp 16 | Ht 68.0 in | Wt 314.0 lb

## 2021-04-19 DIAGNOSIS — M352 Behcet's disease: Secondary | ICD-10-CM

## 2021-04-19 DIAGNOSIS — Z79899 Other long term (current) drug therapy: Secondary | ICD-10-CM | POA: Insufficient documentation

## 2021-04-19 DIAGNOSIS — Z23 Encounter for immunization: Secondary | ICD-10-CM

## 2021-04-19 DIAGNOSIS — D84821 Immunodeficiency due to drugs: Secondary | ICD-10-CM

## 2021-04-19 DIAGNOSIS — L989 Disorder of the skin and subcutaneous tissue, unspecified: Secondary | ICD-10-CM

## 2021-04-19 NOTE — Progress Notes (Signed)
[facility]                       Date of Exam: 04/19/2021 12:34 PM        Patient ID: Hailey [name] is a 40 y.o. female.  Attending Physician: Tyler Aas, PA        Chief Complaint:    Chief Complaint   Patient presents with   . Discuss Autoimmune Deficiency               HPI:    Discuss autoimmune deficiency.  Pt seeing rheum for 3 years for sx but just diagnosed with Bechet disease recently.  Taking immunosupressive meds and the rheumatologist recommended pneumonia vaccine and possible shingles vaccine  Also, concern of burn spot on right foot from a pedicure.  Had over scrubbed the dry skin on her foot and then the pedicurist applied a topical cream which burned.  Is healing but hard to see and asks for me to check it            Problem List:    Patient Active Problem List   Diagnosis   . Morbid obesity   . Herpes labialis   . Non-seasonal allergic rhinitis, unspecified trigger   . Migraine with aura and without status migrainosus, not intractable   . Fever, unknown origin   . BMI 45.0-49.9, adult   . Post-cholecystectomy syndrome   . History of Wolff-Parkinson-White (WPW) syndrome   . Scoliosis deformity of spine   . Behcet's disease   . Immunocompromised state due to drug therapy             Current Meds:    Outpatient Medications Marked as Taking for the 04/19/21 encounter (Office Visit) with Tyler Aas, PA   Medication Sig Dispense Refill   . Apremilast 10 & 20 & 30 MG Tablet Therapy Pack Take 30 mg by mouth 2 (two) times daily 1 each 2   . Azelastine-Fluticasone 137-50 MCG/ACT Suspension 2 (two) times daily     . colesevelam (WELCHOL) 625 MG tablet Take 1 tablet (625 mg total) by mouth 2 (two) times daily with meals 180 tablet 3   . Galcanezumab-gnlm (Emgality) 120 MG/ML Solution Auto-injector Inject 240 mg into the skin every 30 (thirty) days for 30 days, THEN 120 mg every 30 (thirty) days. 4 each 11   . hydrOXYzine (ATARAX) 10 MG tablet Take 1  tablet (10 mg total) by mouth nightly 90 tablet 3   . Multiple Vitamins-Minerals (MULTIVITAMIN ADULT PO) Take by mouth daily        . ondansetron (ZOFRAN-ODT) 4 MG disintegrating tablet Take 1 tablet (4 mg total) by mouth every 8 (eight) hours as needed for Nausea 30 tablet 0   . rizatriptan (MAXALT) 10 MG tablet Take 1-2 tablets (10-20 mg total) by mouth once as needed for Migraine Max 3 days a week 36 tablet 3          Allergies:    Allergies   Allergen Reactions   . Fruit Blend Flavor [Flavoring Agent] Anaphylaxis and Swelling   . Adhesive [Wound Dressing Adhesive]      dermabond   . Latex Other (See Comments), Hives and Swelling     Swelling and redness throughout body   Also severe allergy to surgical glue   . Oxycodone Other (See Comments)     Hallucinations   . Other Itching and Rash  Tropical fruits like coconuts, pineapple, kiwi, etc.             Past Surgical History:    Past Surgical History:   Procedure Laterality Date   . ablasion  2005   . ARTHROTOMY, WRIST Right 12/03/2020    Procedure: RIGHT UPPER EXTREMITY SYNOVIAL BIOPSY;  Surgeon: Kellie Simmering, MD;  Location: [facility];  Service: Plastics;  Laterality: Right;   . BIOPSY, LYMPH NODE N/A 07/23/2018    Procedure: BIOPSY, LYMPH NODE;  Surgeon: Shelda Pal, MD;  Location: [facility];  Service: ENT;  Laterality: N/A;  NECK DEEP LYMPH NODE BIOPSY   . BONE MARROW BIOPSY  2020   . CHOLECYSTECTOMY  2009   . D&C DIAGNOSTIC  2010   . DILATION AND CURETTAGE OF UTERUS  2010   . FINGER GANGLION CYST EXCISION  04/2018   . GANGLION CYST EXCISION  2014    04/2018 cyst removed from index finger-[facility]   . HERNIA REPAIR  2016   . HYSTERECTOMY  2012   . PELVIC LAPAROSCOPY  2011   . UMBILICAL HERNIA REPAIR  2016   . UPPER GASTROINTESTINAL ENDOSCOPY  2009   . WISDOM TOOTH EXTRACTION  1998           Family History:    Family History   Adopted: Yes           Social History:    Social History     Tobacco Use   . Smoking status: Never  Smoker   . Smokeless tobacco: Never Used   Vaping Use   . Vaping Use: Never used   Substance Use Topics   . Alcohol use: Not Currently     Alcohol/week: 0.0 standard drinks     Comment: rarely   . Drug use: Never           The following sections were reviewed this encounter by the provider:   Tobacco  Allergies  Meds  Problems  Med Hx  Surg Hx  Fam Hx               Vital Signs:    BP 122/80 (BP Site: Right arm, Patient Position: Sitting, Cuff Size: Large)   Pulse (!) 52   Temp 99.2 F (37.3 C) (Tympanic)   Resp 16   Ht 1.727 m (5\' 8" )   Wt 142.4 kg (314 lb) Comment: Pt refused weight  LMP  (LMP Unknown)   BMI 47.74 kg/m          ROS:    Review of Systems           Physical Exam:    Physical Exam  Vitals reviewed.   Constitutional:       General: She is not in acute distress.     Appearance: Normal appearance. She is not ill-appearing.   Skin:     Comments: Right lateral foot with healing lesion--43mm scab with surrounding light pink skin and mild peeled skin.  No TTP, fluctuance, ulceration, discharge, vesicles   Neurological:      Mental Status: She is alert.              Assessment:    1. Behcet's disease  - Pneumococcal conjugate vaccine 13-valent  - Zoster Vaccine Recomb,Adjuvanted (IM)    2. Need for vaccination    3. Immunocompromised state due to drug therapy    4. Skin lesion of foot  Plan:    Per CDC, early pneumonia and shingrex vaccines recommended for immunocompromised patients  Pt reassurance foot lesion healing well          Follow-up:    Return in about 6 months (around 10/19/2021) for [place] for pneumonia 23 and shingrex #2.         Tyler Aas, PA

## 2021-04-29 ENCOUNTER — Other Ambulatory Visit (INDEPENDENT_AMBULATORY_CARE_PROVIDER_SITE_OTHER): Payer: Self-pay | Admitting: Neurology

## 2021-04-29 ENCOUNTER — Ambulatory Visit
Admission: RE | Admit: 2021-04-29 | Discharge: 2021-04-29 | Disposition: A | Discharge status: Home / Self Care | Payer: BLUE CROSS/BLUE SHIELD | Source: Ambulatory Visit | Attending: Neurology | Admitting: Neurology

## 2021-04-29 DIAGNOSIS — R292 Abnormal reflex: Secondary | ICD-10-CM

## 2021-04-29 DIAGNOSIS — G43109 Migraine with aura, not intractable, without status migrainosus: Secondary | ICD-10-CM

## 2021-04-29 DIAGNOSIS — M352 Behcet's disease: Secondary | ICD-10-CM

## 2021-04-29 MED ORDER — GADOBUTROL 1 MMOL/ML IV SOSY (WRAP)
10.0000 mL | Freq: Once | INTRAVENOUS | Status: AC
Start: 2021-04-29 — End: 2021-04-29
  Administered 2021-04-29: 10 mL via INTRAVENOUS
  Filled 2021-04-29: qty 10

## 2021-05-12 ENCOUNTER — Encounter (INDEPENDENT_AMBULATORY_CARE_PROVIDER_SITE_OTHER): Payer: Self-pay | Admitting: Internal Medicine

## 2021-05-12 ENCOUNTER — Telehealth: Payer: Self-pay

## 2021-05-12 ENCOUNTER — Telehealth (INDEPENDENT_AMBULATORY_CARE_PROVIDER_SITE_OTHER): Payer: BLUE CROSS/BLUE SHIELD | Admitting: Internal Medicine

## 2021-05-12 DIAGNOSIS — R748 Abnormal levels of other serum enzymes: Secondary | ICD-10-CM

## 2021-05-12 DIAGNOSIS — K760 Fatty (change of) liver, not elsewhere classified: Secondary | ICD-10-CM

## 2021-05-12 DIAGNOSIS — K120 Recurrent oral aphthae: Secondary | ICD-10-CM

## 2021-05-12 DIAGNOSIS — M1990 Unspecified osteoarthritis, unspecified site: Secondary | ICD-10-CM

## 2021-05-12 DIAGNOSIS — Z79899 Other long term (current) drug therapy: Secondary | ICD-10-CM

## 2021-05-12 DIAGNOSIS — G43809 Other migraine, not intractable, without status migrainosus: Secondary | ICD-10-CM

## 2021-05-12 DIAGNOSIS — M352 Behcet's disease: Secondary | ICD-10-CM

## 2021-05-12 MED ORDER — ADALIMUMAB 40 MG/0.4ML SC PNKT
40.0000 mg | PEN_INJECTOR | SUBCUTANEOUS | 3 refills | Status: DC
Start: 2021-05-12 — End: 2021-06-01

## 2021-05-12 NOTE — Progress Notes (Signed)
RHEUMATOLOGY FOLLOW UP TELEMEDICINE NOTE  Telemedicine Documentation Requirements  Originating site (Patient location): home  Distant site (Provider location): Office  Provider and Title: Dr. Adin Hector  Type of Visit: Video Visit  Verbal Consent has been obtained to conduct a telemedicine visit on this date in order to minimize exposure to COVID-19.      PCP: Tyler Aas, PA    HPI:    This patient is a 40 y.o. 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    HPI:    She had a previous Right hand ganglion cyst removed June 2019 from her index finger.  She says 2-3 days later-she says the salivary gland was swollen.  And developed infection in her salivary gland-she was having dysphagia.  She was seen by ENT and Infectious Disease.  She was treated with 4-5 courses of antibiotics-she had white exudate in her salivary gland-she still feels like she has enlarged cervical lymph nodes.     She reports ongoing Right hand pain-worse with movement-no morning pain or stiffness-no inflammatory arthralgias in hands or feet    She underwent excisional lymph node biopsy revealing for reactive changes.      Interval History:  She saw Dr. Evelena Peat on migraine medication-she had MRI studies    She stopped taking colchicine.  She started Otezla-soing well except for diarrhea and anusea the last 2 days.  She would like to continue taking for now.  No oral ulcers.  Fevers slightly less.  Joint Pain improved.  She has still had intermittent headaches-but less so than Colchicine.    She has questions about vitamins or supplements which would be helpful.    She had MRI scans-Brain-through [facility] no major abnormalities    Otezla she is tolerating-minimal diarrhea, occasional nausea.  She does not have constant fevers, but still has some cycles.  She has been on this for 6  weeks.  She has notived an improvement in mouth sores-only a couple.      Previous Labs:  02/23/21  ESR wnl  CRP wnl  CMP ALT 42, AST normal    CRP wnl  ESR wnl  HLAB51 positive  IgE, IgA, IGG, IgM all wnl  CBC wnl    12/03/20  DIAGNOSIS:          A. Lesion, ulnar wrist, excision:     -Fibroadipose tissue and vasculature          B. Synovial tissue, biopsy:     -Fibroadipose tissue and synovium (see comment)          Comment:     Morphologic features typical of rheumatoid arthritis or infection are     not identified.    12/03/20  Bacterial Cultures: No Growth  Fungal Cultures: NG at one week  AFB: No growth    11/20/20  ESR 50  CRP wnl  IgG subclasses wnl  ANA comprehensive panel negative        Previous Labs:    Recent [facility] Reviewed:  10/23/20  C-ANCA, P-ANCA, MPO, PR3 all negative  Mitochondrial Ab-  ANA IFA-  Scl70-  Centromere-  RNP-  SMith-  TPPA Ab-      05/18/20  Ferritin 291  CMP AST/A:T 59/70  CBC elevated lymphocytes    02/06/20  MPO-  PR3-  SSA-  aCL IgM 14  aCL IgG, IgA -  Lupus anticoagulant -  B2IgG, IgM -  ACE wnl  CCP-  RF-  Vit D 31  TSH wnl  ESR wnl  CK wnl  C4 wnl  C3 wnl  CRP wnl    07/2019  ANA Screen-  LDH wnl    07/23/2018 lymph node biopsy:DIAGNOSIS:          Lymph node, deep right neck, excisional biopsy:     -Lymph node with non-specific reactive changes   Wound, AFB and Fungal cultures negative at this time    BONE MARROW 05/03/2019      BONE MARROW, LEFT POSTERIOR ILIAC CREST, CORE BIOPSY, CLOT SECTION,    ASPIRATE SMEAR AND TOUCH PREP:        - NORMOCELLULAR BONE MARROW (approximate50-60%) WITH TRILINEAGE HEMATOPOIESIS,  NORMAL MYELOID TO ERYTHROID RATIO AND ADEQUATE NUMBERS OF    MEGAKARYOCYTES WIT H UNREMARKABLE MORPHOLOGY        - A FEW SMALL REACTIVE LYMPHOID AGGREGATES IDENTIFIED, AND MILDLY  INCREASED NUMBERS OF EOSINOPHILS        - PENDING CYTOGENETIC STUDIES OF BONE MARROW SAMPLE  - Remain pending as of 05/14/2019.        COMMENT:    The concurrent flow cytometry performed  on the bone marrow aspirate  sample shows no evidence of immunophenotypic abnormalities      Imaging Reviewed:    08/16/20  MRI Brain:IMPRESSION:      1. No intracranial mass, hemorrhage, or hydrocephalus is detected.  2. There is normal pattern of enhancement of the brain parenchyma and  meningeal surfaces.    9/18 MRI Abdomen:  IMPRESSION:      1. Diffuse hepatic steatosis. No suspicious focal hepatic lesions.  2. No biliary ductal dilatation or choledocholithiasis.    02/03/20  MRI Pelvis:  IMPRESSION:         Cystic structure at the vaginal cuff, flank by both ovaries not  significantly changed in size compared to 2019. No abnormal internal  enhancement. This favors a benign process such as a peritoneal inclusion  cyst or a paraovarian cyst.    12/14/19 MRI R Hand:  IMPRESSION:      1.  No suspicious soft tissue mass, osseous mass, or masslike  enhancement in the right hand or wrist to correlate with the diffuse FDG  uptake on recent PET/CT.  2.  There is mild enhancing synovitis in the right wrist joint without  active joint erosion or bony destruction.  3.  A small dorsal ganglion cyst overlying the scapholunate interval.    12/06/19  Full Body PET Scan:    Hypermetabolic soft tissue mass in palmar R hand surface at base of the second digit-stable pulmonary nodulke 1.3 cm left lower lobe-recommend follow up Chest CT 3 months         The following sections were reviewed this encounter by the provider:   Tobacco   Allergies   Meds   Problems   Med Hx   Surg Hx   Fam Hx            PMH/PSH:  Past Medical History:   Diagnosis Date    Abnormal Pap smear of cervix     Anemia ~12    Pre-hysterectomy    Behcet's disease     BMI 45.0-49.9, adult     Breast lump     Diarrhea     occasional    Fever     for past ~ 3 months (avg daily fever 100-101)  Gastroesophageal reflux disease     Headache     Herpes simplex virus (HSV) infection     Meningitis     Post-operative nausea and vomiting     Scoliosis     Swollen lymph nodes          Past Surgical History:   Procedure Laterality Date    ablasion  2005    ARTHROTOMY, WRIST Right 12/03/2020    Procedure: RIGHT UPPER EXTREMITY SYNOVIAL BIOPSY;  Surgeon: Kellie Simmering, MD;  Location: [facility];  Service: Plastics;  Laterality: Right;    BIOPSY, LYMPH NODE N/A 07/23/2018    Procedure: BIOPSY, LYMPH NODE;  Surgeon: Shelda Pal, MD;  Location: [facility];  Service: ENT;  Laterality: N/A;  NECK DEEP LYMPH NODE BIOPSY    BONE MARROW BIOPSY  2020    CHOLECYSTECTOMY  2009    D&C DIAGNOSTIC  2010    DILATION AND CURETTAGE OF UTERUS  2010    FINGER GANGLION CYST EXCISION  04/2018    GANGLION CYST EXCISION  2014    04/2018 cyst removed from index finger-[facility]    HERNIA REPAIR  2016    HYSTERECTOMY  2012    PELVIC LAPAROSCOPY  2011    UMBILICAL HERNIA REPAIR  2016    UPPER GASTROINTESTINAL ENDOSCOPY  2009    WISDOM TOOTH EXTRACTION  1998        FH/SH:  Family History   Adopted: Yes       Social History     Tobacco Use    Smoking status: Never    Smokeless tobacco: Never   Vaping Use    Vaping Use: Never used   Substance Use Topics    Alcohol use: Not Currently     Alcohol/week: 0.0 standard drinks     Comment: rarely    Drug use: Never        Meds/ Allergies:  No outpatient medications have been marked as taking for the 05/12/21 encounter (Telemedicine Visit) with Danella Deis, MD.     Allergies   Allergen Reactions    Fruit Blend Flavor [Flavoring Agent] Anaphylaxis and Swelling    Adhesive [Wound Dressing Adhesive]      dermabond    Latex Other (See Comments), Hives and Swelling     Swelling and redness throughout body   Also severe allergy to surgical glue    Oxycodone Other (See Comments)     Hallucinations    Other Itching and Rash     Tropical fruits like coconuts, pineapple, kiwi, etc.          PHYSICAL EXAM  General- WNWD, alert and oriented, NAD  Eyes- EOMI, PERRL, no conjunctival injection, no scleral icterus  ENMT- face symmetric without lesions  Skin-  no rash, no alopecia  Pulm: No conversational dyspnea, breathing comfortable on room air  Neuro - no notable neurological deficits, no facial asymmetry    .        LABS:  Lab Results   Component Value Date    WBC 8.0 01/25/2021    HGB 14.9 01/25/2021    HCT 45.4 01/25/2021    MCV 93 01/25/2021    PLT 407 01/25/2021      Lab Results   Component Value Date    CREAT 0.75 02/23/2021    BUN 12 02/23/2021    NA 138 02/23/2021    K 4.7 02/23/2021    CL 102 02/23/2021    CO2 20  02/23/2021      Lab Results   Component Value Date    ALT 42 (H) 02/23/2021    AST 33 02/23/2021    ALKPHOS 72 02/23/2021    BILITOTAL 0.5 02/23/2021     Lab Results   Component Value Date    ESR 6 02/23/2021    ESR 7 01/25/2021    ESR 50 (H) 11/20/2020    ESR 11 02/06/2020    ESR 9 07/26/2019      Lab Results   Component Value Date    CRP <1 02/23/2021    CRP 2 01/25/2021    CRP 3 11/20/2020    CRP 0.2 02/06/2020      No results found for: ANA   Lab Results   Component Value Date    RHEUMFACTOR <15.0 02/06/2020      Lab Results   Component Value Date    CCP <15.6 02/06/2020     No results found for: URICACID        ASSESSMENT/PLAN:    This patient is a 40 y.o. year female with Past Medical History of Prior Cholecystectomy, Wolf-Parkinson White, Prior Hysterectomy, Fatty Liver Disease who returns for follow up of Behcet's Disease.    Started Otezla-some improvement, still having fevers, joint pain.  We discuss methotrexate vs. Humira.  She would like to start Humira given history of fatty liver diease.    Discussed risks and benefits of biologic therapy including but not limited to hypersensitivity reaction, immunosuppression, reactivation of latent infections, and pt understands to hold administration of the biologic until an infection is resolved.  Other possible adverse effects include:  Risk of malignancy    Following with Dr. Theodoro Grist for Migraines/Headaches.    Will check Vitamin D level with next round of labs  In addition to CMP, inflammatory  markers.    1. Behcet's disease  - Adalimumab 40 MG/0.4ML Pen-injector Kit; Inject 40 mg into the skin every 14 (fourteen) days  Dispense: 2 each; Refill: 3    2. Aphthous ulcer  - Adalimumab 40 MG/0.4ML Pen-injector Kit; Inject 40 mg into the skin every 14 (fourteen) days  Dispense: 2 each; Refill: 3    3. Fatty liver disease, nonalcoholic  - Adalimumab 40 MG/0.4ML Pen-injector Kit; Inject 40 mg into the skin every 14 (fourteen) days  Dispense: 2 each; Refill: 3    4. High risk medication use    5. Other migraine without status migrainosus, not intractable    6. Elevated liver enzymes    7. Inflammatory arthritis         Total Time spent reviewing records, interviewing patient and coordinating plan of care: 29 min    FOLLOW UP:  Return in about 6 weeks (around 06/23/2021) for Follow Up, 4-6 weeks, video OK .                                                                                  Debe Coder, MD   Rheumatologist  [address]  [address]   [place], [place] [address]  P [phone]  F [phone]

## 2021-05-12 NOTE — Telephone Encounter (Signed)
PA Required        PA submitted via CMM ([id number])      Status: Pending plan determination

## 2021-05-12 NOTE — Patient Instructions (Addendum)
Dear [name] ,     Thanks for arranging a video visit with me.    Here are things I'd like you to do following today's visit:  Medication instructions:  Please read about Humira  Please continue taking Tiajuana Amass may retrieve any orders from this visit in Mychart by using these steps:    After signing into Mychart  1) click Messaging  2) click Letters  3) select the Requisition  4) Review and click print    Please contact the clinic at [phone] with any questions and to schedule Your follow up visit.    Sincerely,    Debe Coder, MD  Rheumatologist  [facility]  [phone]

## 2021-05-12 NOTE — Telephone Encounter (Signed)
Request received via In Basket            Humira 40mg/0.4ml Pen-Injector Kit  2/28        Initiating benefits investigation

## 2021-05-12 NOTE — Addendum Note (Signed)
Addended by: Debe Coder MR on: 05/12/2021 08:38 AM     Modules accepted: Level of Service

## 2021-05-13 NOTE — Telephone Encounter (Signed)
Sent appeal letter, articles, and denial letter to Liaison [place] R

## 2021-05-13 NOTE — Telephone Encounter (Signed)
OBC# [phone] (FEP) S/w Rep Jossalyn---Stated there are no alternatives for the diagnosis of behcet's disease. Stated they would cover if patient also has RA diagnosis, otherwise an appeal will need to be submitted      Would you like [place] to start working on an appeal letter for you?      Please advise thank you.

## 2021-05-13 NOTE — Telephone Encounter (Signed)
Ok-what would they like me to use instead as she is already tried colchicine and is on [place].  She has a history of fatty liver disease and elevated LFTs so methotrexate is contra-indicated.  They MUST give an alternative-patient with persistent debilitating fevers and joint pain.    Thank You!    -EM

## 2021-05-13 NOTE — Telephone Encounter (Signed)
Hello Dr. Adin Hector,    This patients Humira PA has been denied for the below reason. Please review and advise. Thanks!

## 2021-05-13 NOTE — Telephone Encounter (Signed)
Yes please-this is absolutely ridiculous, and I would like a letter from them stating in writing that they will not cover any medications for the treatment of her Behcets in the meantime so we can place that in her file for liability reasons.    Thank You,    EM

## 2021-05-14 ENCOUNTER — Encounter (INDEPENDENT_AMBULATORY_CARE_PROVIDER_SITE_OTHER): Payer: Self-pay | Admitting: Internal Medicine

## 2021-05-18 NOTE — Telephone Encounter (Signed)
Humira- Received call from patient requesting update on PA status. Informed her of need for Appeal. Received signed doc from MD. Faxed appeal letter and supporting articles to [facility] ([facility]) Service Benefit Plan [phone]) waiting for response.

## 2021-05-19 NOTE — Telephone Encounter (Signed)
Humira- Received PA denial this morning, appeal requires signed approval from patient. Called and informed patient she confirmed the doc may be sent to her e-mail for signature ([email or web address]). Will resend Appeal when signed doc is sent.

## 2021-05-24 ENCOUNTER — Encounter (INDEPENDENT_AMBULATORY_CARE_PROVIDER_SITE_OTHER): Payer: Self-pay | Admitting: Internal Medicine

## 2021-05-24 DIAGNOSIS — Z79899 Other long term (current) drug therapy: Secondary | ICD-10-CM

## 2021-05-24 DIAGNOSIS — M352 Behcet's disease: Secondary | ICD-10-CM

## 2021-05-24 NOTE — Telephone Encounter (Signed)
Humira- PA APPROVED   04/21/2021 through 05/21/2022  Reaching out to patient for FA consent and to set up delivery.   Co-pay without co-pay card [phone] 2/28 day supply.

## 2021-05-26 LAB — HEPATITIS B SURFACE ANTIBODY: HEPATITIS B SURFACE ANTIBODY: 761.2 m[IU]/mL (ref >9.9)

## 2021-05-26 LAB — HEPATITIS C ANTIBODY: HCV AB: 0.1 s/co ratio (ref 0.0–0.9)

## 2021-05-26 LAB — HIV-1/2 AG/AB 4TH GEN. W/ REFLEX: HIV Screen 4th Generation wRfx: NONREACTIVE

## 2021-05-26 LAB — HEPATITIS B SURFACE ANTIGEN W/ REFLEX TO CONFIRMATION: Hepatitis B Surface Antigen: NEGATIVE

## 2021-05-31 LAB — QUANTIFERON(R)-TB GOLD PLUS
QuantiFERON Mitogen Value: >10 IU/mL
QuantiFERON Nil Value: 0 IU/mL
Quantiferon TB Gold Plus: NEGATIVE
Quantiferon TB1 Ag Value: 0 IU/mL
Quantiferon TB2 Ag Value: 0.01 IU/mL

## 2021-06-01 ENCOUNTER — Other Ambulatory Visit: Payer: Self-pay

## 2021-06-01 DIAGNOSIS — K120 Recurrent oral aphthae: Secondary | ICD-10-CM

## 2021-06-01 DIAGNOSIS — M352 Behcet's disease: Secondary | ICD-10-CM

## 2021-06-01 DIAGNOSIS — K760 Fatty (change of) liver, not elsewhere classified: Secondary | ICD-10-CM

## 2021-06-01 NOTE — Telephone Encounter (Signed)
Hello Dr. Adin Hector,    I received a call from this patient, she is being told by her insurance she must fill with [facility] for future fills. Please authorize the queued order for her future fills.   Thanks!  -[place]

## 2021-06-02 ENCOUNTER — Encounter (INDEPENDENT_AMBULATORY_CARE_PROVIDER_SITE_OTHER): Payer: Self-pay

## 2021-06-02 MED ORDER — ADALIMUMAB 40 MG/0.4ML SC PNKT
40.0000 mg | PEN_INJECTOR | SUBCUTANEOUS | 2 refills | Status: DC
Start: 2021-06-02 — End: 2021-07-19

## 2021-06-14 ENCOUNTER — Encounter (INDEPENDENT_AMBULATORY_CARE_PROVIDER_SITE_OTHER): Payer: Self-pay | Admitting: Neurology

## 2021-06-14 ENCOUNTER — Ambulatory Visit (INDEPENDENT_AMBULATORY_CARE_PROVIDER_SITE_OTHER): Payer: BLUE CROSS/BLUE SHIELD | Admitting: Neurology

## 2021-06-14 VITALS — BP 126/72 | HR 50 | Ht 68.0 in | Wt 314.2 lb

## 2021-06-14 DIAGNOSIS — G43109 Migraine with aura, not intractable, without status migrainosus: Secondary | ICD-10-CM

## 2021-06-14 DIAGNOSIS — M5090 Cervical disc disorder, unspecified, unspecified cervical region: Secondary | ICD-10-CM

## 2021-06-14 DIAGNOSIS — M352 Behcet's disease: Secondary | ICD-10-CM

## 2021-06-14 MED ORDER — RIZATRIPTAN BENZOATE 10 MG PO TABS
10.0000 mg | ORAL_TABLET | Freq: Once | ORAL | 3 refills | Status: DC | PRN
Start: 2021-06-14 — End: 2023-05-05

## 2021-06-14 NOTE — Progress Notes (Signed)
NEUROLOGY  MULTIPLE SCLEROSIS & NEUROIMMUNOLOGY CENTER  Main Line / Scheduling / on-call: [phone]  Clinic Tel: [phone]  Fax: [phone]  Send [place] a mychart or inbasket message for the fastest response  ~~~~~~~~~~~~~~~~~~~~~~~~~~~~~~~~~~~~~~~~~~~~~~~~~~~~~~~~~~~~~~~~~~~~~~~~~~~~~~~~~~~~    IN-PERSON    CC: Hailey [name]    HPI:   History was obtained from patient,     40 y.o. year-old female dx with Hailey [name] 02/08/21 having had 20+ yr hx oral ulcers, and more recently sialedentiis, lymphadenopathy, pathergy, PET+ hand and lung lesions, and +HLA-B51 test. She is on humira per rheum Dr Adin Hector.    Neurologically, she c/o headaches. Emgality helps. Rizatriptan works. Occurs 1-2 days a week. Some right sided posterior discomfort in head, neck , shoulder.     Review of Previous records reveals:  05/12/21- last visit with rheum Dr Adin Hector. Rx'd humira      EXAM  Visit Vitals  BP 126/72 (BP Site: Right arm, Patient Position: Sitting, Cuff Size: Large)   Pulse (!) 50   Ht 1.727 m (5\' 8" )   Wt 142.5 kg (314 lb 3.2 oz)   LMP  (LMP Unknown)   BMI 47.77 kg/m     General:   Fundoscopic:   Psychiatric.   Mental Status: The patient was awake, alert, appropriate  Cranial Nerves: CN II-XII nl  Motor: 5/5  Reflex:   Sensation:   Coordination:   Gait: normal      Imaging (my summary based on personal review of images). Reviewed in Epic.   MRI b/c/t/l 04/30/21 was reveiwed. It looks normal. There is right c4-c5 foraminal stenosis. I do not see any leptomeningeal enhancement or cord lesions    Testing (EMG/NCS, EKG, EEG, Echo, Evoked potentials) - Reviewed in Epic.       Labs. Reviewed in epic. See below  Lab Results   Component Value Date    HGBA1C 5.7 (H) 09/15/2017    GLU 96 02/23/2021    EGFR 103 02/23/2021     Lab Results   Component Value Date    VITD 31 02/06/2020    CREAT 0.75 02/23/2021    WBC 8.0 01/25/2021        ASSESSMENT / PLAN  RTC: Return in about 6 months (around 12/15/2021) for [place].    1. Migraine with aura  and without status migrainosus, not intractable    2. Hailey [name] disease    3. Cervical disc disease      39y/o woman with Hailey [name] disease, but without CNS complication and she does not have neuro Hailey [name]. I see no contraindication to TNFi therapy and defer treatment of Hailey [name] to her rheumatologist. Agree w/ Humira.     She has migraines which are independent of Hailey [name].  Emgality and rizatriptan seem to work well for her.    The cervical MRI did show unilateral C4/C5 DJD, I will refer to physical therapy to treat this.  It may help her headaches.    PLAN  PT referral for cervical DJD  Continue emgality, maxalt      Orders today (details below)  Orders Placed This Encounter   Procedures    Ambulatory referral to Physical Therapy       Orders Placed This Encounter   Medications    rizatriptan (MAXALT) 10 MG tablet     Sig: Take 1-2 tablets (10-20 mg total) by mouth once as needed for Migraine Max 3 days a week     Dispense:  36 tablet     Refill:  3  I spent 40 minutes reviewing the records, talking with the patient and developing their care plan.      Lora Havens, MD PHD  Director, Neuroimmunology & MS Center  [facility] Medicine    [address], [place], [place] [address]  Main line / after-hours on-call: [phone]      For direct access, contact Allayne Stack RN  Tel [phone]   Fax [phone]   ===================================================================    PATIENT INSTRUCTIONS PROVIDED  Patient was given an "After Visit Summary" with a copy of the testing orders, medications and the following other instructions:  There are no Patient Instructions on file for this visit.    REFERRAL / CARE COORDINATION  see communication section, referrals section, EPIC notes  Referring MD:No referring provider defined for this encounter.  Fax:   Primary Care on file: Tyler Aas, [place]  Tel: [phone]   Fax: [phone]  Care  Team:Patient Care Team:  Tyler Aas, [place] as PCP - General (Physician Assistant)  Manfred Arch D  Provider, Generic Hpf (Inactive)  Tyler Aas, [place]  Tivis Ringer, MD as Consulting Physician (Hematology)  Lu Duffel, MD as Consulting Physician (Obstetrics and Gynecology)    IMPORTED INFORMATION FROM MEDICAL RECORDS    ICD-10-CM    1. Migraine with aura and without status migrainosus, not intractable  G43.109 rizatriptan (MAXALT) 10 MG tablet      2. Hailey [name] disease  M35.2       3. Cervical disc disease  M50.90 Ambulatory referral to Physical Therapy        Orders Placed This Encounter   Procedures    Ambulatory referral to Physical Therapy     Referral Priority:   Routine     Referral Type:   Consultation     Referral Reason:   Specialty Services Required     Requested Specialty:   Physical Therapy     Number of Visits Requested:   1

## 2021-06-16 ENCOUNTER — Encounter (INDEPENDENT_AMBULATORY_CARE_PROVIDER_SITE_OTHER): Payer: Self-pay | Admitting: Internal Medicine

## 2021-06-16 ENCOUNTER — Telehealth (INDEPENDENT_AMBULATORY_CARE_PROVIDER_SITE_OTHER): Payer: BLUE CROSS/BLUE SHIELD | Admitting: Internal Medicine

## 2021-06-16 DIAGNOSIS — Z79899 Other long term (current) drug therapy: Secondary | ICD-10-CM

## 2021-06-16 DIAGNOSIS — M352 Behcet's disease: Secondary | ICD-10-CM

## 2021-06-16 DIAGNOSIS — E559 Vitamin D deficiency, unspecified: Secondary | ICD-10-CM

## 2021-06-16 DIAGNOSIS — K120 Recurrent oral aphthae: Secondary | ICD-10-CM

## 2021-06-16 DIAGNOSIS — K760 Fatty (change of) liver, not elsewhere classified: Secondary | ICD-10-CM

## 2021-06-16 NOTE — Progress Notes (Signed)
RHEUMATOLOGY FOLLOW UP TELEMEDICINE NOTE  Telemedicine Documentation Requirements  Originating site (Patient location): home  Distant site (Provider location): Office  Provider and Title: Dr. Adin Hector  Type of Visit: Video Visit  Verbal Consent has been obtained to conduct a telemedicine visit on this date in order to minimize exposure to COVID-19.      PCP: Tyler Aas, PA    HPI:    This patient is a 40 y.o. 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    HPI:    She had a previous Right hand ganglion cyst removed June 2019 from her index finger.  She says 2-3 days later-she says the salivary gland was swollen.  And developed infection in her salivary gland-she was having dysphagia.  She was seen by ENT and Infectious Disease.  She was treated with 4-5 courses of antibiotics-she had white exudate in her salivary gland-she still feels like she has enlarged cervical lymph nodes.     She reports ongoing Right hand pain-worse with movement-no morning pain or stiffness-no inflammatory arthralgias in hands or feet    She follows with Dr. Theodoro Grist for migraines-is on emgality.    She underwent excisional lymph node biopsy revealing for reactive changes.      Interval History:    She started the Humira 2 weeks ago-was approved by insurance.  She feels like energy has improved by 20%.    Fever is mostly better-lower grade.    She is still taking [place].  She does not have an increase in diarrhea or nausea.    She reports labile blood pressure-wasn't sure if this was from Humira.    She says mouth sores are better.  She still has left ankle, knee pain every days.  Lower back and hips bother her intermittently.      Labs Reviewed:  05/25/21  Angela Burke-  HepbSAb wnl  HepbSAg-  HepCAb-  HIV-        Previous Labs:  02/23/21  ESR wnl  CRP wnl  CMP ALT 42, AST normal    CRP  wnl  ESR wnl  HLAB51 positive  IgE, IgA, IGG, IgM all wnl  CBC wnl    12/03/20  DIAGNOSIS:          A. Lesion, ulnar wrist, excision:     -Fibroadipose tissue and vasculature          B. Synovial tissue, biopsy:     -Fibroadipose tissue and synovium (see comment)          Comment:     Morphologic features typical of rheumatoid arthritis or infection are     not identified.    12/03/20  Bacterial Cultures: No Growth  Fungal Cultures: NG at one week  AFB: No growth    11/20/20  ESR 50  CRP wnl  IgG subclasses wnl  ANA comprehensive panel negative        Previous Labs:    Recent [facility] Reviewed:  10/23/20  C-ANCA, P-ANCA, MPO, PR3 all negative  Mitochondrial Ab-  ANA IFA-  Scl70-  Centromere-  RNP-  SMith-  TPPA Ab-      05/18/20  Ferritin 291  CMP AST/A:T 59/70  CBC elevated lymphocytes    02/06/20  MPO-  PR3-  SSA-  aCL IgM 14  aCL IgG, IgA -  Lupus anticoagulant -  B2IgG, IgM -  ACE wnl  CCP-  RF-  Vit D 31  TSH wnl  ESR wnl  CK wnl  C4 wnl  C3 wnl  CRP wnl    07/2019  ANA Screen-  LDH wnl    07/23/2018 lymph node biopsy:DIAGNOSIS:          Lymph node, deep right neck, excisional biopsy:     -Lymph node with non-specific reactive changes   Wound, AFB and Fungal cultures negative at this time    BONE MARROW 05/03/2019      BONE MARROW, LEFT POSTERIOR ILIAC CREST, CORE BIOPSY, CLOT SECTION,    ASPIRATE SMEAR AND TOUCH PREP:        - NORMOCELLULAR BONE MARROW (approximate50-60%) WITH TRILINEAGE HEMATOPOIESIS,  NORMAL MYELOID TO ERYTHROID RATIO AND ADEQUATE NUMBERS OF    MEGAKARYOCYTES WIT H UNREMARKABLE MORPHOLOGY        - A FEW SMALL REACTIVE LYMPHOID AGGREGATES IDENTIFIED, AND MILDLY  INCREASED NUMBERS OF EOSINOPHILS        - PENDING CYTOGENETIC STUDIES OF BONE MARROW SAMPLE  - Remain pending as of 05/14/2019.        COMMENT:    The concurrent flow cytometry performed on the bone marrow aspirate  sample shows no evidence of immunophenotypic abnormalities      Imaging Reviewed:    08/16/20  MRI Brain:IMPRESSION:       1. No intracranial mass, hemorrhage, or hydrocephalus is detected.  2. There is normal pattern of enhancement of the brain parenchyma and  meningeal surfaces.    9/18 MRI Abdomen:  IMPRESSION:      1. Diffuse hepatic steatosis. No suspicious focal hepatic lesions.  2. No biliary ductal dilatation or choledocholithiasis.    02/03/20  MRI Pelvis:  IMPRESSION:         Cystic structure at the vaginal cuff, flank by both ovaries not  significantly changed in size compared to 2019. No abnormal internal  enhancement. This favors a benign process such as a peritoneal inclusion  cyst or a paraovarian cyst.    12/14/19 MRI R Hand:  IMPRESSION:      1.  No suspicious soft tissue mass, osseous mass, or masslike  enhancement in the right hand or wrist to correlate with the diffuse FDG  uptake on recent PET/CT.  2.  There is mild enhancing synovitis in the right wrist joint without  active joint erosion or bony destruction.  3.  A small dorsal ganglion cyst overlying the scapholunate interval.    12/06/19  Full Body PET Scan:    Hypermetabolic soft tissue mass in palmar R hand surface at base of the second digit-stable pulmonary nodulke 1.3 cm left lower lobe-recommend follow up Chest CT 3 months         The following sections were reviewed this encounter by the provider:   Tobacco   Allergies   Meds   Problems   Med Hx   Surg Hx   Fam Hx          PMH/PSH:  Past Medical History:   Diagnosis Date    Abnormal Pap smear of cervix     Anemia ~12    Pre-hysterectomy    Behcet's disease     BMI 45.0-49.9, adult     Breast lump     Diarrhea     occasional    Fever     for past ~ 3 months (avg daily fever 100-101)    Gastroesophageal reflux disease     Headache  Herpes simplex virus (HSV) infection     Meningitis     Post-operative nausea and vomiting     Scoliosis     Swollen lymph nodes         Past Surgical History:   Procedure Laterality Date    ablasion  2005    ARTHROTOMY, WRIST Right 12/03/2020    Procedure: RIGHT UPPER  EXTREMITY SYNOVIAL BIOPSY;  Surgeon: Kellie Simmering, MD;  Location: [facility];  Service: Plastics;  Laterality: Right;    BIOPSY, LYMPH NODE N/A 07/23/2018    Procedure: BIOPSY, LYMPH NODE;  Surgeon: Shelda Pal, MD;  Location: [facility];  Service: ENT;  Laterality: N/A;  NECK DEEP LYMPH NODE BIOPSY    BONE MARROW BIOPSY  2020    CHOLECYSTECTOMY  2009    D&C DIAGNOSTIC  2010    DILATION AND CURETTAGE OF UTERUS  2010    FINGER GANGLION CYST EXCISION  04/2018    GANGLION CYST EXCISION  2014    04/2018 cyst removed from index finger-[facility]    HERNIA REPAIR  2016    HYSTERECTOMY  2012    PELVIC LAPAROSCOPY  2011    UMBILICAL HERNIA REPAIR  2016    UPPER GASTROINTESTINAL ENDOSCOPY  2009    WISDOM TOOTH EXTRACTION  1998        FH/SH:  Family History   Adopted: Yes       Social History     Tobacco Use    Smoking status: Never    Smokeless tobacco: Never   Vaping Use    Vaping Use: Never used   Substance Use Topics    Alcohol use: Not Currently     Alcohol/week: 0.0 standard drinks     Comment: rarely    Drug use: Never        Meds/ Allergies:  No outpatient medications have been marked as taking for the 06/16/21 encounter (Telemedicine Visit) with Danella Deis, MD.     Allergies   Allergen Reactions    Fruit Blend Flavor [Flavoring Agent] Anaphylaxis and Swelling    Adhesive [Wound Dressing Adhesive]      dermabond    Latex Other (See Comments), Hives and Swelling     Swelling and redness throughout body   Also severe allergy to surgical glue    Oxycodone Other (See Comments)     Hallucinations    Other Itching and Rash     Tropical fruits like coconuts, pineapple, kiwi, etc.          PHYSICAL EXAM  General- WNWD, alert and oriented, NAD  Eyes- EOMI, PERRL, no conjunctival injection, no scleral icterus  ENMT- face symmetric without lesions  Skin- no rash, no alopecia  Pulm: No conversational dyspnea, breathing comfortable on room air  Neuro - no notable neurological deficits, no  facial asymmetry    .        LABS:  Lab Results   Component Value Date    WBC 8.0 01/25/2021    HGB 14.9 01/25/2021    HCT 45.4 01/25/2021    MCV 93 01/25/2021    PLT 407 01/25/2021      Lab Results   Component Value Date    CREAT 0.75 02/23/2021    BUN 12 02/23/2021    NA 138 02/23/2021    K 4.7 02/23/2021    CL 102 02/23/2021    CO2 20 02/23/2021      Lab Results   Component Value  Date    ALT 42 (H) 02/23/2021    AST 33 02/23/2021    ALKPHOS 72 02/23/2021    BILITOTAL 0.5 02/23/2021     Lab Results   Component Value Date    ESR 6 02/23/2021    ESR 7 01/25/2021    ESR 50 (H) 11/20/2020    ESR 11 02/06/2020    ESR 9 07/26/2019      Lab Results   Component Value Date    CRP <1 02/23/2021    CRP 2 01/25/2021    CRP 3 11/20/2020    CRP 0.2 02/06/2020      No results found for: ANA   Lab Results   Component Value Date    RHEUMFACTOR <15.0 02/06/2020      Lab Results   Component Value Date    CCP <15.6 02/06/2020     No results found for: URICACID        ASSESSMENT/PLAN:    This patient is a 40 y.o. year female with Past Medical History of Prior Cholecystectomy, Wolf-Parkinson White, Prior Hysterectomy, Fatty Liver Disease who returns for follow up of Behcet's Disease.    She will continue [place] and Humira.  Otezla for recurrent aphthous ulcers, Humira for fevers and inflammatory arthralgias of Behcets.    Following with Dr. Theodoro Grist for Migraines/Headaches.    Will check Vitamin D level.    Will repeat inflammatory markers and CMP given history of fatty liver disease.    1. Behcet's disease  - CBC and differential; Future  - Comprehensive metabolic panel; Future  - Sedimentation rate (ESR); Future  - C Reactive Protein; Future  - Vitamin D,25 OH, Total; Future    2. High risk medication use  - CBC and differential; Future  - Comprehensive metabolic panel; Future  - Sedimentation rate (ESR); Future  - C Reactive Protein; Future  - Vitamin D,25 OH, Total; Future    3. Aphthous ulcer  - CBC and differential; Future  -  Comprehensive metabolic panel; Future  - Sedimentation rate (ESR); Future  - C Reactive Protein; Future  - Vitamin D,25 OH, Total; Future    4. Fatty liver disease, nonalcoholic  - CBC and differential; Future  - Comprehensive metabolic panel; Future  - Sedimentation rate (ESR); Future  - C Reactive Protein; Future  - Vitamin D,25 OH, Total; Future    5. Vitamin D deficiency  - Vitamin D,25 OH, Total; Future         Total Time spent reviewing records, interviewing patient and coordinating plan of care: 29 min    FOLLOW UP:  Return in about 8 weeks (around 08/11/2021) for Follow Up, video.                                                                                  Debe Coder, MD  Rheumatologist  [facility]  [address]  [place], [place] [address]  Phone: [phone]  Fax: [phone]

## 2021-06-16 NOTE — Patient Instructions (Signed)
Dear Tulsi Linda Macphee ,     Thanks for arranging a video visit with me.    Here are things I'd like you to do following today's visit:  Please have blood taken for labs prior to your next visit.    You may retrieve any orders from this visit in Mychart by using these steps:    After signing into Mychart  1) click Messaging  2) click Letters  3) select the Requisition  4) Review and click print    Please contact the clinic at [phone] with any questions and to schedule your follow up visit.    Sincerely,    Bethanee Redondo, MD  Rheumatologist  [facility]  [address]  [place], [place] [address]  Phone: [phone]  Fax: [phone]

## 2021-06-18 ENCOUNTER — Telehealth: Payer: Self-pay

## 2021-06-18 ENCOUNTER — Encounter (INDEPENDENT_AMBULATORY_CARE_PROVIDER_SITE_OTHER): Payer: Self-pay | Admitting: Internal Medicine

## 2021-07-01 ENCOUNTER — Telehealth: Payer: Self-pay

## 2021-07-01 ENCOUNTER — Other Ambulatory Visit (INDEPENDENT_AMBULATORY_CARE_PROVIDER_SITE_OTHER): Payer: Self-pay | Admitting: Internal Medicine

## 2021-07-01 NOTE — Telephone Encounter (Signed)
Humira- after finding patient still needs a new PA for taking Humira and [place] together, MD requests peer to peer. Sent teams chat to [facility] requesting she call and set it up.     Not able to schedule PEER TO PEER. Will Need a Letter from PT- Letter must be from pt requesting for reconsideration of Humira.Pt must sign and date submit to insurance. Can fax [phone] Once letter is received then [employer] would start the appeal process and call to schedule PEER TO PEER if needed   Sent patients appeal letter to [facility] [phone] requesting Peer to Peer be done and to refer to previously approved PA & appeal for most recent OV notes and perinate articles.

## 2021-07-05 ENCOUNTER — Encounter (INDEPENDENT_AMBULATORY_CARE_PROVIDER_SITE_OTHER): Payer: Self-pay | Admitting: Internal Medicine

## 2021-07-05 NOTE — Telephone Encounter (Signed)
Humira- Denial letter sent Sat 8/28, had nurse Christina scan in denial and pt appeal letter to pt chart per MD request. Called insurance to set up peer to peer [phone] s/w Renee. She stated they do not offer to set up peer to peers but the MD can call in and request to speak with an [facility] regarding the decision. Sent message to MD via secure chat.

## 2021-07-06 ENCOUNTER — Encounter (INDEPENDENT_AMBULATORY_CARE_PROVIDER_SITE_OTHER): Payer: Self-pay | Admitting: Internal Medicine

## 2021-07-14 ENCOUNTER — Telehealth: Payer: Self-pay

## 2021-07-14 NOTE — Telephone Encounter (Signed)
Hi Dr. Adin Hector,    I called [employer] today for this patient and spoke with clinical pharmacist Thereasa Distance. He was very hesitant however was willing to send the appeal decision back to the appeals department for a peer to peer. I am sorry to schedule this without your permission, however I was concerned we may not get this far at a later date.     I scheduled the peer to peer for Wednesday 9/14 at 12 noon. If you could be available even just to tell them a better time to discuss Thereasa Distance stated that would be appropriate. I called the patient also to make her aware.     Thanks so much!   [place]

## 2021-07-15 ENCOUNTER — Telehealth (INDEPENDENT_AMBULATORY_CARE_PROVIDER_SITE_OTHER): Payer: Self-pay | Admitting: Internal Medicine

## 2021-07-15 NOTE — Telephone Encounter (Signed)
Please advise, thank you [place]!  Dr. Adin Hector out of clinic until 07/19/21

## 2021-07-15 NOTE — Telephone Encounter (Signed)
Dr. Gaspar Garbe with [facility], an independent assessor and rheumatology doctor (for an independent review for standard of care) for ins co. [facility] [employer] called re: the appeal for Humira. Please reach out to her by 9/14 at noon on Wed. and much sooner if possible. She can be reached at: [phone].

## 2021-07-16 NOTE — Telephone Encounter (Signed)
Have been in contact with patient and Dr. Adin Hector regarding PA for Humira and [place]. Peer to Peer scheduled for Wednesday 9/14 at 12 noon. Dr. Adin Hector aware via secure chat.

## 2021-07-17 NOTE — Telephone Encounter (Signed)
SPoke with Dr. Amedeo Kinsman reports there is data to support use of Otezla and Humira together for behcets and she will forward her opinion to patient's insurance.  She is a third party doc contracted to render coverage opinion for insurance.    -EM

## 2021-07-17 NOTE — Telephone Encounter (Signed)
No worries-thanks so much for scheduling this!    -EM

## 2021-07-19 ENCOUNTER — Other Ambulatory Visit: Payer: Self-pay

## 2021-07-19 DIAGNOSIS — M352 Behcet's disease: Secondary | ICD-10-CM

## 2021-07-19 DIAGNOSIS — K120 Recurrent oral aphthae: Secondary | ICD-10-CM

## 2021-07-19 DIAGNOSIS — K760 Fatty (change of) liver, not elsewhere classified: Secondary | ICD-10-CM

## 2021-07-19 MED ORDER — ADALIMUMAB 40 MG/0.4ML SC PNKT
40.0000 mg | PEN_INJECTOR | SUBCUTANEOUS | 2 refills | Status: DC
Start: 2021-07-19 — End: 2021-09-23

## 2021-07-19 NOTE — Telephone Encounter (Signed)
Hi Dr. Adin Hector,    She has been APPROVED for Humira. [facility] told her they need a new order. Please approve the queued Rx for fill.  Thanks!   [place]

## 2021-08-11 ENCOUNTER — Encounter (INDEPENDENT_AMBULATORY_CARE_PROVIDER_SITE_OTHER): Payer: Self-pay | Admitting: Internal Medicine

## 2021-08-11 ENCOUNTER — Telehealth (INDEPENDENT_AMBULATORY_CARE_PROVIDER_SITE_OTHER): Payer: BLUE CROSS/BLUE SHIELD | Admitting: Internal Medicine

## 2021-08-11 DIAGNOSIS — Z79899 Other long term (current) drug therapy: Secondary | ICD-10-CM

## 2021-08-11 DIAGNOSIS — K760 Fatty (change of) liver, not elsewhere classified: Secondary | ICD-10-CM

## 2021-08-11 DIAGNOSIS — M352 Behcet's disease: Secondary | ICD-10-CM

## 2021-08-11 DIAGNOSIS — E559 Vitamin D deficiency, unspecified: Secondary | ICD-10-CM

## 2021-08-11 DIAGNOSIS — Z7185 Encounter for immunization safety counseling: Secondary | ICD-10-CM

## 2021-08-11 NOTE — Progress Notes (Signed)
RHEUMATOLOGY FOLLOW UP TELEMEDICINE NOTE  Telemedicine Documentation Requirements  Originating site (Patient location): home  Distant site (Provider location): Office  Provider and Title: Dr. Adin Hector  Type of Visit: Video Visit  Verbal Consent has been obtained to conduct a telemedicine visit on this date in order to minimize exposure to COVID-19.      PCP: Tyler Aas, PA    HPI:    This patient is a 40 y.o. 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    HPI:    She had a previous Right hand ganglion cyst removed June 2019 from her index finger.  She says 2-3 days later-she says the salivary gland was swollen.  And developed infection in her salivary gland-she was having dysphagia.  She was seen by ENT and Infectious Disease.  She was treated with 4-5 courses of antibiotics-she had white exudate in her salivary gland-she still feels like she has enlarged cervical lymph nodes.     She reports ongoing Right hand pain-worse with movement-no morning pain or stiffness-no inflammatory arthralgias in hands or feet    She follows with Dr. Theodoro Grist for migraines-is on emgality.    She underwent excisional lymph node biopsy revealing for reactive changes.      Interval History:    She is back on Humira since insurance improved both.  She is also on antibiotics for 13 days for an eye infection-blepharitis. Her orbital socket was swollen on the bottom after starting Humira.    Fever is mostly better-lower grade.    She is still taking [place].  She does not have an increase in diarrhea or nausea.    She takes [place]- has not stopped    No worsening of the fevers-hit over 102-no worse.  Before her blepharitis her fever is better.      She says mouth sores have improved on [place].    She says joint pain has improved.  She feels worse closer to a Humira shot.    She is  taking vitamin D and fish oil-total is 3000 IU D3 daily.    She says headaches are better on emgality.    She saw her dermatologist-injected the scar on her R wrist-she is doing another round on that in her wrist.    She is schedule to get her flut and covid    Previous Labs:  05/25/21  Quant Gold-  HepbSAb wnl  HepbSAg-  HepCAb-  HIV-        Previous Labs:  02/23/21  ESR wnl  CRP wnl  CMP ALT 42, AST normal    CRP wnl  ESR wnl  HLAB51 positive  IgE, IgA, IGG, IgM all wnl  CBC wnl    12/03/20  DIAGNOSIS:          A. Lesion, ulnar wrist, excision:     -Fibroadipose tissue and vasculature          B. Synovial tissue, biopsy:     -Fibroadipose tissue and synovium (see comment)          Comment:     Morphologic features typical of rheumatoid arthritis or infection are     not identified.    12/03/20  Bacterial Cultures: No Growth  Fungal Cultures: NG at one week  AFB: No growth    11/20/20  ESR 50  CRP wnl  IgG subclasses wnl  ANA  comprehensive panel negative        Previous Labs:    Recent [facility] [employer] Reviewed:  10/23/20  C-ANCA, P-ANCA, MPO, PR3 all negative  Mitochondrial Ab-  ANA IFA-  Scl70-  Centromere-  RNP-  SMith-  TPPA Ab-      05/18/20  Ferritin 291  CMP AST/A:T 59/70  CBC elevated lymphocytes    02/06/20  MPO-  PR3-  SSA-  aCL IgM 14  aCL IgG, IgA -  Lupus anticoagulant -  B2IgG, IgM -  ACE wnl  CCP-  RF-  Vit D 31  TSH wnl  ESR wnl  CK wnl  C4 wnl  C3 wnl  CRP wnl    07/2019  ANA Screen-  LDH wnl    07/23/2018 lymph node biopsy:DIAGNOSIS:          Lymph node, deep right neck, excisional biopsy:     -Lymph node with non-specific reactive changes   Wound, AFB and Fungal cultures negative at this time    BONE MARROW 05/03/2019      BONE MARROW, LEFT POSTERIOR ILIAC CREST, CORE BIOPSY, CLOT SECTION,    ASPIRATE SMEAR AND TOUCH PREP:        - NORMOCELLULAR BONE MARROW (approximate50-60%) WITH TRILINEAGE HEMATOPOIESIS,  NORMAL MYELOID TO ERYTHROID RATIO AND ADEQUATE NUMBERS OF    MEGAKARYOCYTES WIT H UNREMARKABLE  MORPHOLOGY        - A FEW SMALL REACTIVE LYMPHOID AGGREGATES IDENTIFIED, AND MILDLY  INCREASED NUMBERS OF EOSINOPHILS        - PENDING CYTOGENETIC STUDIES OF BONE MARROW SAMPLE  - Remain pending as of 05/14/2019.        COMMENT:    The concurrent flow cytometry performed on the bone marrow aspirate  sample shows no evidence of immunophenotypic abnormalities      Imaging Reviewed:    08/16/20  MRI Brain:IMPRESSION:      1. No intracranial mass, hemorrhage, or hydrocephalus is detected.  2. There is normal pattern of enhancement of the brain parenchyma and  meningeal surfaces.    9/18 MRI Abdomen:  IMPRESSION:      1. Diffuse hepatic steatosis. No suspicious focal hepatic lesions.  2. No biliary ductal dilatation or choledocholithiasis.    02/03/20  MRI Pelvis:  IMPRESSION:         Cystic structure at the vaginal cuff, flank by both ovaries not  significantly changed in size compared to 2019. No abnormal internal  enhancement. This favors a benign process such as a peritoneal inclusion  cyst or a paraovarian cyst.    12/14/19 MRI R Hand:  IMPRESSION:      1.  No suspicious soft tissue mass, osseous mass, or masslike  enhancement in the right hand or wrist to correlate with the diffuse FDG  uptake on recent PET/CT.  2.  There is mild enhancing synovitis in the right wrist joint without  active joint erosion or bony destruction.  3.  A small dorsal ganglion cyst overlying the scapholunate interval.    12/06/19  Full Body PET Scan:    Hypermetabolic soft tissue mass in palmar R hand surface at base of the second digit-stable pulmonary nodulke 1.3 cm left lower lobe-recommend follow up Chest CT 3 months         The following sections were reviewed this encounter by the provider:   Tobacco   Allergies   Meds   Problems   Med Hx   Surg Hx   Fam Hx  PMH/PSH:  Past Medical History:   Diagnosis Date    Abnormal Pap smear of cervix     Anemia ~12    Pre-hysterectomy    Behcet's disease     BMI 45.0-49.9, adult     Breast lump      Diarrhea     occasional    Fever     for past ~ 3 months (avg daily fever 100-101)    Gastroesophageal reflux disease     Headache     Herpes simplex virus (HSV) infection     Meningitis     Post-operative nausea and vomiting     Scoliosis     Swollen lymph nodes         Past Surgical History:   Procedure Laterality Date    ablasion  2005    ARTHROTOMY, WRIST Right 12/03/2020    Procedure: RIGHT UPPER EXTREMITY SYNOVIAL BIOPSY;  Surgeon: Kellie Simmering, MD;  Location: [facility];  Service: Plastics;  Laterality: Right;    BIOPSY, LYMPH NODE N/A 07/23/2018    Procedure: BIOPSY, LYMPH NODE;  Surgeon: Shelda Pal, MD;  Location: [facility];  Service: ENT;  Laterality: N/A;  NECK DEEP LYMPH NODE BIOPSY    BONE MARROW BIOPSY  2020    CHOLECYSTECTOMY  2009    D&C DIAGNOSTIC  2010    DILATION AND CURETTAGE OF UTERUS  2010    FINGER GANGLION CYST EXCISION  04/2018    GANGLION CYST EXCISION  2014    04/2018 cyst removed from index finger-[facility]    HERNIA REPAIR  2016    HYSTERECTOMY  2012    PELVIC LAPAROSCOPY  2011    UMBILICAL HERNIA REPAIR  2016    UPPER GASTROINTESTINAL ENDOSCOPY  2009    WISDOM TOOTH EXTRACTION  1998        FH/SH:  Family History   Adopted: Yes       Social History     Tobacco Use    Smoking status: Never    Smokeless tobacco: Never   Vaping Use    Vaping Use: Never used   Substance Use Topics    Alcohol use: Not Currently     Alcohol/week: 0.0 standard drinks     Comment: rarely    Drug use: Never        Meds/ Allergies:  No outpatient medications have been marked as taking for the 08/11/21 encounter (Telemedicine Visit) with Baltazar Najjar, MD.     Allergies   Allergen Reactions    Fruit Blend Flavor [Flavoring Agent] Anaphylaxis and Swelling    Adhesive [Wound Dressing Adhesive]      dermabond    Latex Other (See Comments), Hives and Swelling     Swelling and redness throughout body   Also severe allergy to surgical glue    Oxycodone Other (See Comments)      Hallucinations    Other Itching and Rash     Tropical fruits like coconuts, pineapple, kiwi, etc.          PHYSICAL EXAM  General- WNWD, alert and oriented, NAD  Eyes- EOMI, PERRL, no conjunctival injection, no scleral icterus  ENMT- face symmetric without lesions  Skin- no rash, no alopecia  Pulm: No conversational dyspnea, breathing comfortable on room air  Neuro - no notable neurological deficits, no facial asymmetry    .        LABS:  Lab Results   Component Value Date  WBC 8.0 01/25/2021    HGB 14.9 01/25/2021    HCT 45.4 01/25/2021    MCV 93 01/25/2021    PLT 407 01/25/2021      Lab Results   Component Value Date    CREAT 0.75 02/23/2021    BUN 12 02/23/2021    NA 138 02/23/2021    K 4.7 02/23/2021    CL 102 02/23/2021    CO2 20 02/23/2021      Lab Results   Component Value Date    ALT 42 (H) 02/23/2021    AST 33 02/23/2021    ALKPHOS 72 02/23/2021    BILITOTAL 0.5 02/23/2021     Lab Results   Component Value Date    ESR 6 02/23/2021    ESR 7 01/25/2021    ESR 50 (H) 11/20/2020    ESR 11 02/06/2020    ESR 9 07/26/2019      Lab Results   Component Value Date    CRP <1 02/23/2021    CRP 2 01/25/2021    CRP 3 11/20/2020    CRP 0.2 02/06/2020      No results found for: ANA   Lab Results   Component Value Date    RHEUMFACTOR <15.0 02/06/2020      Lab Results   Component Value Date    CCP <15.6 02/06/2020     No results found for: URICACID        ASSESSMENT/PLAN:    This patient is a 40 y.o. year female with Past Medical History of Prior Cholecystectomy, Wolf-Parkinson White, Prior Hysterectomy, Fatty Liver Disease who returns for follow up of Behcet's Disease.    She will continue [place] and Humira.  Insurance issues but finally back on.  Will obtain disease monitoring labs.    Following with Dr. Theodoro Grist for Migraines/Headaches.    Will check Vitamin D level.    She will get flu and covid-does not need to hold Humira-she will get pneumonia vaccine and shingles in December-would hold Humira for at least 1 week  after.    1. Behcet's disease  - CBC and differential; Future  - Comprehensive metabolic panel; Future  - Sedimentation rate (ESR); Future  - C Reactive Protein; Future  - Vitamin D,25 OH, Total; Future    2. High risk medication use  - CBC and differential; Future  - Comprehensive metabolic panel; Future  - Sedimentation rate (ESR); Future  - C Reactive Protein; Future  - Vitamin D,25 OH, Total; Future    3. Vitamin D deficiency  - CBC and differential; Future  - Comprehensive metabolic panel; Future  - Sedimentation rate (ESR); Future  - C Reactive Protein; Future  - Vitamin D,25 OH, Total; Future    4. Fatty liver disease, nonalcoholic  - CBC and differential; Future  - Comprehensive metabolic panel; Future  - Sedimentation rate (ESR); Future  - C Reactive Protein; Future  - Vitamin D,25 OH, Total; Future    5. Vaccine counseling         Total Time spent reviewing records, interviewing patient and coordinating plan of care: 25 min    FOLLOW UP:  Return in about 6 weeks (around 09/22/2021) for Follow Up, 6-8 weeks video OK.  Tarry Kos, MD  Rheumatologist  [facility]  [address]  [place], [place] [address]  Phone: [phone]  Fax: [phone]

## 2021-08-11 NOTE — Patient Instructions (Signed)
Dear [name] ,     Thanks for arranging a video visit with me.    Here are things I'd like you to do following today's visit:  Please have blood taken for labs.    You may retrieve any orders from this visit in Mychart by using these steps:    After signing into Mychart  1) click Messaging  2) click Letters  3) select the Requisition  4) Review and click print    Please contact the clinic at [phone] with any questions and to schedule your follow up visit.    Sincerely,    Debe Coder, MD  Rheumatologist  [facility]  [address]  [place], [place] [address]  Phone: [phone]  Fax: [phone]

## 2021-08-13 ENCOUNTER — Encounter (INDEPENDENT_AMBULATORY_CARE_PROVIDER_SITE_OTHER): Payer: Self-pay

## 2021-08-20 ENCOUNTER — Encounter (INDEPENDENT_AMBULATORY_CARE_PROVIDER_SITE_OTHER): Payer: Self-pay | Admitting: Internal Medicine

## 2021-08-23 ENCOUNTER — Encounter (INDEPENDENT_AMBULATORY_CARE_PROVIDER_SITE_OTHER): Payer: Self-pay | Admitting: Internal Medicine

## 2021-08-24 ENCOUNTER — Telehealth (INDEPENDENT_AMBULATORY_CARE_PROVIDER_SITE_OTHER): Payer: Self-pay | Admitting: Internal Medicine

## 2021-08-24 NOTE — Telephone Encounter (Signed)
Hi [place] -   Engineer, water requesting Clarification for [employer] Requested CB ...   Looks like patient is taking both Humira & Otezla. When I get a chance, am happy to call them back.    ASSESSMENT/PLAN:     This patient is a 40 y.o. year female with Past Medical History of Prior Cholecystectomy, Wolf-Parkinson White, Prior Hysterectomy, Fatty Liver Disease who returns for follow up of Behcet's Disease.     She will continue [place] and Humira.  Insurance issues but finally back on.  Will obtain disease monitoring labs.     Following with Dr. Theodoro Grist for Migraines/Headaches.     Will check Vitamin D level.     She will get flu and covid-does not need to hold Humira-she will get pneumonia vaccine and shingles in December-would hold Humira for at least 1 week after.     1. Behcet's disease  - CBC and differential; Future  - Comprehensive metabolic panel; Future  - Sedimentation rate (ESR); Future  - C Reactive Protein; Future  - Vitamin D,25 OH, Total; Future     2. High risk medication use  - CBC and differential; Future  - Comprehensive metabolic panel; Future  - Sedimentation rate (ESR); Future  - C Reactive Protein; Future  - Vitamin D,25 OH, Total; Future     3. Vitamin D deficiency  - CBC and differential; Future  - Comprehensive metabolic panel; Future  - Sedimentation rate (ESR); Future  - C Reactive Protein; Future  - Vitamin D,25 OH, Total; Future     4. Fatty liver disease, nonalcoholic  - CBC and differential; Future  - Comprehensive metabolic panel; Future  - Sedimentation rate (ESR); Future  - C Reactive Protein; Future  - Vitamin D,25 OH, Total; Future     5. Vaccine counseling           Total Time spent reviewing records, interviewing patient and coordinating plan of care: 25 min     FOLLOW UP:  Return in about 6 weeks (around 09/22/2021) for Follow Up, 6-8 weeks video OK.

## 2021-08-24 NOTE — Telephone Encounter (Signed)
Called [facility]- spoke to [place]; Rx clarification provided, as patient is taking both Humira and Breck Coons-

## 2021-08-24 NOTE — Telephone Encounter (Signed)
Thanks all!    -EM

## 2021-08-24 NOTE — Telephone Encounter (Signed)
The pharmacy is calling to clarify which medication the patient is supposed to be taking, or if the pt should continue on both. Medications in question are Humira and Otezla. Please contact the pharmacy to discuss.

## 2021-09-23 ENCOUNTER — Telehealth (INDEPENDENT_AMBULATORY_CARE_PROVIDER_SITE_OTHER): Payer: BLUE CROSS/BLUE SHIELD | Admitting: Internal Medicine

## 2021-09-23 ENCOUNTER — Encounter (INDEPENDENT_AMBULATORY_CARE_PROVIDER_SITE_OTHER): Payer: Self-pay | Admitting: Internal Medicine

## 2021-09-23 ENCOUNTER — Other Ambulatory Visit: Payer: Self-pay

## 2021-09-23 DIAGNOSIS — K760 Fatty (change of) liver, not elsewhere classified: Secondary | ICD-10-CM

## 2021-09-23 DIAGNOSIS — Z79899 Other long term (current) drug therapy: Secondary | ICD-10-CM

## 2021-09-23 DIAGNOSIS — K120 Recurrent oral aphthae: Secondary | ICD-10-CM

## 2021-09-23 DIAGNOSIS — M0600 Rheumatoid arthritis without rheumatoid factor, unspecified site: Secondary | ICD-10-CM

## 2021-09-23 DIAGNOSIS — M352 Behcet's disease: Secondary | ICD-10-CM

## 2021-09-23 MED ORDER — ADALIMUMAB 40 MG/0.4ML SC PNKT
40.0000 mg | PEN_INJECTOR | SUBCUTANEOUS | 3 refills | Status: DC
Start: 2021-09-23 — End: 2021-09-23

## 2021-09-23 NOTE — Progress Notes (Signed)
RHEUMATOLOGY FOLLOW UP TELEMEDICINE NOTE  Telemedicine Documentation Requirements  Originating site (Patient location): home  Distant site (Provider location): Office  Provider and Title: Dr. Adin Hector  Type of Visit: Video Visit  Verbal Consent has been obtained to conduct a telemedicine visit on this date in order to minimize exposure to COVID-19.      PCP: Tyler Aas, PA    HPI:    This patient is a 40 y.o. 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    HPI:    She had a previous Right hand ganglion cyst removed June 2019 from her index finger.  She says 2-3 days later-she says the salivary gland was swollen.  And developed infection in her salivary gland-she was having dysphagia.  She was seen by ENT and Infectious Disease.  She was treated with 4-5 courses of antibiotics-she had white exudate in her salivary gland-she still feels like she has enlarged cervical lymph nodes.    She follows with Dr. Theodoro Grist for migraines-is on emgality.    She underwent excisional lymph node biopsy revealing for reactive changes.      Interval History:    She reports stomach issues the last 24 hours-diarrhea, nausea, emesis.      She is on Humira every other week.  Otezla every day.  She says fever, inflammatory arthritis symptoms return 3 days before her shot is due.  Rapid onset joint pain returns.      Fever improves to 100.0 even after taking Humira-improvement from 101 to 102 without Humira.      She is still taking [place].  She does not have an increase in diarrhea or nausea.    She says her joint pain/arthritis improves 50-60% better on Humira.    She says mouth sores have improved on [place].  She says mouth sores are 90% better on the [place].    She says headaches are better on emgality.    Dermatologist started her on dapsone    Derm: Dr. Shellee Milo    Labs  Reviewed:  08/18/21  CBC wnl  CMP wnl  ESR wnl  CRP wnl    Previous Labs:  05/25/21  Quant Gold-  HepbSAb wnl  HepbSAg-  HepCAb-  HIV-        Previous Labs:  02/23/21  ESR wnl  CRP wnl  CMP ALT 42, AST normal    CRP wnl  ESR wnl  HLAB51 positive  IgE, IgA, IGG, IgM all wnl  CBC wnl    12/03/20  DIAGNOSIS:          A. Lesion, ulnar wrist, excision:     -Fibroadipose tissue and vasculature          B. Synovial tissue, biopsy:     -Fibroadipose tissue and synovium (see comment)          Comment:     Morphologic features typical of rheumatoid arthritis or infection are     not identified.    12/03/20  Bacterial Cultures: No Growth  Fungal Cultures: NG at one week  AFB: No growth    11/20/20  ESR 50  CRP wnl  IgG subclasses wnl  ANA comprehensive panel negative        Previous Labs:    Recent [facility] Reviewed:  10/23/20  C-ANCA, P-ANCA, MPO, PR3 all negative  Mitochondrial Ab-  ANA  IFA-  Scl70-  Centromere-  RNP-  SMith-  TPPA Ab-      05/18/20  Ferritin 291  CMP AST/A:T 59/70  CBC elevated lymphocytes    02/06/20  MPO-  PR3-  SSA-  aCL IgM 14  aCL IgG, IgA -  Lupus anticoagulant -  B2IgG, IgM -  ACE wnl  CCP-  RF-  Vit D 31  TSH wnl  ESR wnl  CK wnl  C4 wnl  C3 wnl  CRP wnl    07/2019  ANA Screen-  LDH wnl    07/23/2018 lymph node biopsy:DIAGNOSIS:          Lymph node, deep right neck, excisional biopsy:     -Lymph node with non-specific reactive changes   Wound, AFB and Fungal cultures negative at this time    BONE MARROW 05/03/2019      BONE MARROW, LEFT POSTERIOR ILIAC CREST, CORE BIOPSY, CLOT SECTION,    ASPIRATE SMEAR AND TOUCH PREP:        - NORMOCELLULAR BONE MARROW (approximate50-60%) WITH TRILINEAGE HEMATOPOIESIS,  NORMAL MYELOID TO ERYTHROID RATIO AND ADEQUATE NUMBERS OF    MEGAKARYOCYTES WIT H UNREMARKABLE MORPHOLOGY        - A FEW SMALL REACTIVE LYMPHOID AGGREGATES IDENTIFIED, AND MILDLY  INCREASED NUMBERS OF EOSINOPHILS        - PENDING CYTOGENETIC STUDIES OF BONE MARROW SAMPLE  - Remain pending as of  05/14/2019.        COMMENT:    The concurrent flow cytometry performed on the bone marrow aspirate  sample shows no evidence of immunophenotypic abnormalities      Imaging Reviewed:    08/16/20  MRI Brain:IMPRESSION:      1. No intracranial mass, hemorrhage, or hydrocephalus is detected.  2. There is normal pattern of enhancement of the brain parenchyma and  meningeal surfaces.    9/18 MRI Abdomen:  IMPRESSION:      1. Diffuse hepatic steatosis. No suspicious focal hepatic lesions.  2. No biliary ductal dilatation or choledocholithiasis.    02/03/20  MRI Pelvis:  IMPRESSION:         Cystic structure at the vaginal cuff, flank by both ovaries not  significantly changed in size compared to 2019. No abnormal internal  enhancement. This favors a benign process such as a peritoneal inclusion  cyst or a paraovarian cyst.    12/14/19 MRI R Hand:  IMPRESSION:      1.  No suspicious soft tissue mass, osseous mass, or masslike  enhancement in the right hand or wrist to correlate with the diffuse FDG  uptake on recent PET/CT.  2.  There is mild enhancing synovitis in the right wrist joint without  active joint erosion or bony destruction.  3.  A small dorsal ganglion cyst overlying the scapholunate interval.    12/06/19  Full Body PET Scan:    Hypermetabolic soft tissue mass in palmar R hand surface at base of the second digit-stable pulmonary nodulke 1.3 cm left lower lobe-recommend follow up Chest CT 3 months         The following sections were reviewed this encounter by the provider:   Tobacco  Allergies  Meds  Problems  Med Hx  Surg Hx  Fam Hx         PMH/PSH:  Past Medical History:   Diagnosis Date    Abnormal Pap smear of cervix     Anemia ~12    Pre-hysterectomy    Behcet's disease  BMI 45.0-49.9, adult     Breast lump     Diarrhea     occasional    Fever     for past ~ 3 months (avg daily fever 100-101)    Gastroesophageal reflux disease     Headache     Herpes simplex virus (HSV) infection     Meningitis      Post-operative nausea and vomiting     Scoliosis     Swollen lymph nodes         Past Surgical History:   Procedure Laterality Date    ablasion  2005    ARTHROTOMY, WRIST Right 12/03/2020    Procedure: RIGHT UPPER EXTREMITY SYNOVIAL BIOPSY;  Surgeon: Kellie Simmering, MD;  Location: [facility];  Service: Plastics;  Laterality: Right;    BIOPSY, LYMPH NODE N/A 07/23/2018    Procedure: BIOPSY, LYMPH NODE;  Surgeon: Shelda Pal, MD;  Location: [facility];  Service: ENT;  Laterality: N/A;  NECK DEEP LYMPH NODE BIOPSY    BONE MARROW BIOPSY  2020    CHOLECYSTECTOMY  2009    D&C DIAGNOSTIC  2010    DILATION AND CURETTAGE OF UTERUS  2010    FINGER GANGLION CYST EXCISION  04/2018    GANGLION CYST EXCISION  2014    04/2018 cyst removed from index finger-[facility]    HERNIA REPAIR  2016    HYSTERECTOMY  2012    PELVIC LAPAROSCOPY  2011    UMBILICAL HERNIA REPAIR  2016    UPPER GASTROINTESTINAL ENDOSCOPY  2009    WISDOM TOOTH EXTRACTION  1998        FH/SH:  Family History   Adopted: Yes       Social History     Tobacco Use    Smoking status: Never    Smokeless tobacco: Never   Vaping Use    Vaping Use: Never used   Substance Use Topics    Alcohol use: Not Currently     Alcohol/week: 0.0 standard drinks     Comment: rarely    Drug use: Never        Meds/ Allergies:  No outpatient medications have been marked as taking for the 09/23/21 encounter (Telemedicine Visit) with Baltazar Najjar, MD.     Allergies   Allergen Reactions    Fruit Blend Flavor [Flavoring Agent] Anaphylaxis and Swelling    Adhesive [Wound Dressing Adhesive]      dermabond    Latex Other (See Comments), Hives and Swelling     Swelling and redness throughout body   Also severe allergy to surgical glue    Oxycodone Other (See Comments)     Hallucinations    Other Itching and Rash     Tropical fruits like coconuts, pineapple, kiwi, etc.          PHYSICAL EXAM  General- WNWD, alert and oriented, NAD  Eyes- EOMI, PERRL, no conjunctival  injection, no scleral icterus  ENMT- face symmetric without lesions  Skin- no rash, no alopecia  Pulm: No conversational dyspnea, breathing comfortable on room air  Neuro - no notable neurological deficits, no facial asymmetry    .        LABS:  Lab Results   Component Value Date    WBC 8.0 01/25/2021    HGB 14.9 01/25/2021    HCT 45.4 01/25/2021    MCV 93 01/25/2021    PLT 407 01/25/2021      Lab Results  Component Value Date    CREAT 0.75 02/23/2021    BUN 12 02/23/2021    NA 138 02/23/2021    K 4.7 02/23/2021    CL 102 02/23/2021    CO2 20 02/23/2021      Lab Results   Component Value Date    ALT 42 (H) 02/23/2021    AST 33 02/23/2021    ALKPHOS 72 02/23/2021    BILITOTAL 0.5 02/23/2021     Lab Results   Component Value Date    ESR 6 02/23/2021    ESR 7 01/25/2021    ESR 50 (H) 11/20/2020    ESR 11 02/06/2020    ESR 9 07/26/2019      Lab Results   Component Value Date    CRP <1 02/23/2021    CRP 2 01/25/2021    CRP 3 11/20/2020    CRP 0.2 02/06/2020      No results found for: ANA   Lab Results   Component Value Date    RHEUMFACTOR <15.0 02/06/2020      Lab Results   Component Value Date    CCP <15.6 02/06/2020     No results found for: URICACID        ASSESSMENT/PLAN:    This patient is a 40 y.o. year female with Past Medical History of Prior Cholecystectomy, Wolf-Parkinson White, Prior Hysterectomy, Fatty Liver Disease who returns for follow up of Behcet's Disease and seronegative rheumatoid arthritis.    She will continue [place] and Humira.  She is having 60-70% benefits in terms of seronegative rheumatoid arthritis with Humira and her joint pain, however her emdication wears off 2-3 days before her next dose is due thus will increase frequency to every ten days.    Continue Otezla 30 mg twice daily for mouth sores and behcets-she has had 90% benefit in terms of her mouth sores.    Following with Dr. Theodoro Grist for Migraines/Headaches.    Recent labs normal.    1. Behcet's disease  - Adalimumab 40 MG/0.4ML  Pen-injector Kit; Inject 40 mg into the skin every 10 (ten) days  Dispense: 6 each; Refill: 3    2. Aphthous ulcer  - Adalimumab 40 MG/0.4ML Pen-injector Kit; Inject 40 mg into the skin every 10 (ten) days  Dispense: 6 each; Refill: 3    3. Fatty liver disease, nonalcoholic  - Adalimumab 40 MG/0.4ML Pen-injector Kit; Inject 40 mg into the skin every 10 (ten) days  Dispense: 6 each; Refill: 3    4. Seronegative rheumatoid arthritis  - Adalimumab 40 MG/0.4ML Pen-injector Kit; Inject 40 mg into the skin every 10 (ten) days  Dispense: 6 each; Refill: 3    5. High risk medication use         Total Time spent reviewing records, interviewing patient and coordinating plan of care: 17 min    FOLLOW UP:  Return in about 8 weeks (around 11/18/2021) for Follow Up, in person.                                                                                  Debe Coder, MD  Rheumatologist  [facility]  [phone]  [address]  [place], [place] [address]  Phone: [phone]  Fax: [phone]

## 2021-09-23 NOTE — Patient Instructions (Signed)
Dear [name] ,     Thanks for arranging a video visit with me.    Here are things I'd like you to do following today's visit:  Medication instructions:  -Please increase medication to Humira every ten days  -Please continue Otezla 30 mg twice daily    You may retrieve any orders from this visit in Mychart by using these steps:    After signing into Mychart  1) click Messaging  2) click Letters  3) select the Requisition  4) Review and click print    Please contact the clinic at [phone] with any questions and to schedule your follow up visit.    Sincerely,    Debe Coder, MD  Rheumatologist  [facility]  [address]  [place], [place] [address]  Phone: [phone]  Fax: [phone]

## 2021-09-23 NOTE — Telephone Encounter (Signed)
Hi Dr.McBride    Her insurance requires she fill with [facility]. Please approve the queued order if still applicable.   Thanks,  [place]

## 2021-09-27 MED ORDER — ADALIMUMAB 40 MG/0.4ML SC PNKT
40.0000 mg | PEN_INJECTOR | SUBCUTANEOUS | 3 refills | Status: DC
Start: 2021-09-27 — End: 2022-03-07

## 2021-10-06 ENCOUNTER — Encounter (INDEPENDENT_AMBULATORY_CARE_PROVIDER_SITE_OTHER): Payer: Self-pay | Admitting: Internal Medicine

## 2021-10-06 ENCOUNTER — Telehealth (INDEPENDENT_AMBULATORY_CARE_PROVIDER_SITE_OTHER): Payer: Self-pay | Admitting: Internal Medicine

## 2021-10-06 NOTE — Telephone Encounter (Signed)
[facility] is calling for clarification on the pts Humira Rx. They need to confirm the frequency change before they can dispense the medication to the pt. Please contact the pharmacy to discuss.

## 2021-10-06 NOTE — Telephone Encounter (Signed)
Called [facility] [phone] spoke with [facility] Caryn Bee. Verified sig has changed from 1/14 days to 1/10 days. Stated co-pay is still showing $0, sent my chart message informing patient she is able to refill.

## 2021-10-07 ENCOUNTER — Encounter (INDEPENDENT_AMBULATORY_CARE_PROVIDER_SITE_OTHER): Payer: Self-pay | Admitting: Internal Medicine

## 2021-10-19 ENCOUNTER — Other Ambulatory Visit (INDEPENDENT_AMBULATORY_CARE_PROVIDER_SITE_OTHER): Payer: Self-pay | Admitting: Internal Medicine

## 2021-10-22 ENCOUNTER — Ambulatory Visit (INDEPENDENT_AMBULATORY_CARE_PROVIDER_SITE_OTHER): Payer: BLUE CROSS/BLUE SHIELD

## 2021-10-22 DIAGNOSIS — Z23 Encounter for immunization: Secondary | ICD-10-CM

## 2021-10-22 NOTE — Progress Notes (Signed)
Pt came in to get 2nd shingrix and Prevnar 20 vaccine. Pt tolerated both injection in L deltoid area with out adverse and left in good condition.

## 2021-11-24 ENCOUNTER — Encounter (INDEPENDENT_AMBULATORY_CARE_PROVIDER_SITE_OTHER): Payer: Self-pay | Admitting: Internal Medicine

## 2021-11-24 ENCOUNTER — Ambulatory Visit (INDEPENDENT_AMBULATORY_CARE_PROVIDER_SITE_OTHER): Payer: BLUE CROSS/BLUE SHIELD | Admitting: Internal Medicine

## 2021-11-24 VITALS — BP 132/75 | HR 51 | Ht 68.0 in | Wt 314.4 lb

## 2021-11-24 DIAGNOSIS — M352 Behcet's disease: Secondary | ICD-10-CM

## 2021-11-24 DIAGNOSIS — Z79899 Other long term (current) drug therapy: Secondary | ICD-10-CM

## 2021-11-24 DIAGNOSIS — K120 Recurrent oral aphthae: Secondary | ICD-10-CM

## 2021-11-24 DIAGNOSIS — M0600 Rheumatoid arthritis without rheumatoid factor, unspecified site: Secondary | ICD-10-CM

## 2021-11-24 NOTE — Patient Instructions (Signed)
Dear Hailey [name] ,     It was lovely to see you in clinic today.    Here are things I'd like you to do following today's visit:  Medication instructions:  -Please continue Humira every ten days  -Please continue Otezla 30 mg twice daily    We will discuss your results at your next appointment. Please set up that appointment on your way out. If there are any extraordinary abnormal results, you will hear from me or our staff here in the office.    Sincerely,    Debe Coder, MD  Rheumatologist  [facility]  [address]  [place], [place] [address]  Phone: [phone]  Fax: [phone]

## 2021-11-24 NOTE — Progress Notes (Signed)
RHEUMATOLOGY FOLLOW UP NOTE        PCP: Jimmy Picket, PA    HPI:    This patient is a 41 y.o. year female with Past Medical History of Prior Cholecystectomy, Wold-Parkinson White, Prior Hysterectomy who returns for follow up of Behcet's HLAB51+, recurrent oral ulcers, pathergy, low grade fevers and seronegative arthritis.      Interval History:    She had to hold her medicines because of vaccines-She had 2 doses of Humira since,  her fevers came back but these are better.    She increased frequency of Humira to every ten days.  She says this is helping.  She overall feels that her fatigue is 50% better, her fevers are 60% better on this increased frequency of Humira.  Joint pain is 60-70% better.    She is still taking Otezla twice daily, she still ahs some intermittent nausea.  Oral mouth sores 90% better on Otezla.      Fevers improved to 98    She reports worsening "dizziness" And she has vertigo that is movement related.  She has been on inner ear medication hydroxyzine-she feels like it is worse.      She says headaches are better on emgality.    Dermatologist started her on dapsone-she has had a few scars that are healing-had injections from her dermatologist.    Derm: Dr. Shellee Milo    No lightheadedness, she does get notifications of a low heart rate on her apple watch.    Labs Reviewed:  08/18/21  CBC wnl  CMP wnl  ESR wnl  CRP wnl    Previous Labs:  05/25/21  Quant Gold-  HepbSAb wnl  HepbSAg-  HepCAb-  HIV-        Previous Labs:  02/23/21  ESR wnl  CRP wnl  CMP ALT 42, AST normal    CRP wnl  ESR wnl  HLAB51 positive  IgE, IgA, IGG, IgM all wnl  CBC wnl    12/03/20  DIAGNOSIS:          A. Lesion, ulnar wrist, excision:     -Fibroadipose tissue and vasculature          B. Synovial tissue, biopsy:     -Fibroadipose tissue and synovium (see comment)          Comment:     Morphologic features typical of rheumatoid arthritis or infection are     not identified.    12/03/20  Bacterial Cultures: No Growth  Fungal  Cultures: NG at one week  AFB: No growth    11/20/20  ESR 50  CRP wnl  IgG subclasses wnl  ANA comprehensive panel negative        Previous Labs:    Recent [facility] Reviewed:  10/23/20  C-ANCA, P-ANCA, MPO, PR3 all negative  Mitochondrial Ab-  ANA IFA-  Scl70-  Centromere-  RNP-  SMith-  TPPA Ab-      05/18/20  Ferritin 291  CMP AST/A:T 59/70  CBC elevated lymphocytes    02/06/20  MPO-  PR3-  SSA-  aCL IgM 14  aCL IgG, IgA -  Lupus anticoagulant -  B2IgG, IgM -  ACE wnl  CCP-  RF-  Vit D 31  TSH wnl  ESR wnl  CK wnl  C4 wnl  C3 wnl  CRP wnl    07/2019  ANA Screen-  LDH wnl    07/23/2018 lymph node biopsy:DIAGNOSIS:  Lymph node, deep right neck, excisional biopsy:     -Lymph node with non-specific reactive changes   Wound, AFB and Fungal cultures negative at this time    BONE MARROW 05/03/2019      BONE MARROW, LEFT POSTERIOR ILIAC CREST, CORE BIOPSY, CLOT SECTION,    ASPIRATE SMEAR AND TOUCH PREP:        - NORMOCELLULAR BONE MARROW (approximate50-60%) WITH TRILINEAGE HEMATOPOIESIS,  NORMAL MYELOID TO ERYTHROID RATIO AND ADEQUATE NUMBERS OF    MEGAKARYOCYTES WIT H UNREMARKABLE MORPHOLOGY        - A FEW SMALL REACTIVE LYMPHOID AGGREGATES IDENTIFIED, AND MILDLY  INCREASED NUMBERS OF EOSINOPHILS        - PENDING CYTOGENETIC STUDIES OF BONE MARROW SAMPLE  - Remain pending as of 05/14/2019.        COMMENT:    The concurrent flow cytometry performed on the bone marrow aspirate  sample shows no evidence of immunophenotypic abnormalities      Imaging Reviewed:    08/16/20  MRI Brain:IMPRESSION:      1. No intracranial mass, hemorrhage, or hydrocephalus is detected.  2. There is normal pattern of enhancement of the brain parenchyma and  meningeal surfaces.    9/18 MRI Abdomen:  IMPRESSION:      1. Diffuse hepatic steatosis. No suspicious focal hepatic lesions.  2. No biliary ductal dilatation or choledocholithiasis.    02/03/20  MRI Pelvis:  IMPRESSION:         Cystic structure at the vaginal cuff, flank by both  ovaries not  significantly changed in size compared to 2019. No abnormal internal  enhancement. This favors a benign process such as a peritoneal inclusion  cyst or a paraovarian cyst.    12/14/19 MRI R Hand:  IMPRESSION:      1.  No suspicious soft tissue mass, osseous mass, or masslike  enhancement in the right hand or wrist to correlate with the diffuse FDG  uptake on recent PET/CT.  2.  There is mild enhancing synovitis in the right wrist joint without  active joint erosion or bony destruction.  3.  A small dorsal ganglion cyst overlying the scapholunate interval.    12/06/19  Full Body PET Scan:    Hypermetabolic soft tissue mass in palmar R hand surface at base of the second digit-stable pulmonary nodulke 1.3 cm left lower lobe-recommend follow up Chest CT 3 months         The following sections were reviewed this encounter by the provider:        PMH/PSH:  Past Medical History:   Diagnosis Date    Abnormal Pap smear of cervix     Anemia ~12    Pre-hysterectomy    Behcet's disease     BMI 45.0-49.9, adult     Breast lump     Diarrhea     occasional    Fever     for past ~ 3 months (avg daily fever 100-101)    Gastroesophageal reflux disease     Headache     Herpes simplex virus (HSV) infection     Meningitis     Post-operative nausea and vomiting     Scoliosis     Swollen lymph nodes         Past Surgical History:   Procedure Laterality Date    ablasion  2005    ARTHROTOMY, WRIST Right 12/03/2020    Procedure: RIGHT UPPER EXTREMITY SYNOVIAL BIOPSY;  Surgeon: Kellie Simmering, MD;  Location: [facility];  Service: Plastics;  Laterality: Right;    BIOPSY, LYMPH NODE N/A 07/23/2018    Procedure: BIOPSY, LYMPH NODE;  Surgeon: Shelda Pal, MD;  Location: [facility];  Service: ENT;  Laterality: N/A;  NECK DEEP LYMPH NODE BIOPSY    BONE MARROW BIOPSY  2020    CHOLECYSTECTOMY  2009    D&C DIAGNOSTIC  2010    DILATION AND CURETTAGE OF UTERUS  2010    FINGER GANGLION CYST EXCISION  04/2018    GANGLION CYST  EXCISION  2014    04/2018 cyst removed from index finger-[facility]    HERNIA REPAIR  2016    HYSTERECTOMY  2012    PELVIC LAPAROSCOPY  2011    UMBILICAL HERNIA REPAIR  2016    UPPER GASTROINTESTINAL ENDOSCOPY  2009    WISDOM TOOTH EXTRACTION  1998        FH/SH:  Family History   Adopted: Yes       Social History     Tobacco Use    Smoking status: Never    Smokeless tobacco: Never   Vaping Use    Vaping Use: Never used   Substance Use Topics    Alcohol use: Not Currently     Alcohol/week: 0.0 standard drinks     Comment: rarely    Drug use: Never        Meds/ Allergies:  Outpatient Medications Marked as Taking for the 11/24/21 encounter (Office Visit) with Baltazar Najjar, MD   Medication Sig Dispense Refill    Adalimumab 40 MG/0.4ML Pen-injector Kit Inject 40 mg into the skin every 10 (ten) days 6 each 3    Azelastine-Fluticasone 137-50 MCG/ACT Suspension 2 (two) times daily as needed      colesevelam (WELCHOL) 625 MG tablet Take 1 tablet (625 mg total) by mouth 2 (two) times daily with meals 180 tablet 3    EPINEPHrine (EPIPEN 2-PAK) 0.3 MG/0.3ML Solution Auto-injector injection Inject 0.3 mLs (0.3 mg total) into the muscle once as needed (Anaphylaxis) ; go to ER after injection 1 each 1    Galcanezumab-gnlm (Emgality) 120 MG/ML Solution Auto-injector Inject 240 mg into the skin every 30 (thirty) days for 30 days, THEN 120 mg every 30 (thirty) days. 4 each 11    hydrOXYzine (ATARAX) 10 MG tablet Take 1 tablet (10 mg total) by mouth nightly 90 tablet 3    LORazepam (ATIVAN) 2 MG tablet 1 Tab half-hour prior to each MRI and another tab immediately before. May cause sedation - do not drive. 8 tablet 0    Multiple Vitamins-Minerals (MULTIVITAMIN ADULT PO) Take by mouth daily         ondansetron (ZOFRAN-ODT) 4 MG disintegrating tablet Take 1 tablet (4 mg total) by mouth every 8 (eight) hours as needed for Nausea 30 tablet 0    Otezla 30 MG Tab TAKE 1 TABLET BY MOUTH 2 TIMES A DAY. 60 tablet 2    rizatriptan  (MAXALT) 10 MG tablet Take 1-2 tablets (10-20 mg total) by mouth once as needed for Migraine Max 3 days a week 36 tablet 3    valACYclovir (VALTREX) 1000 MG tablet Take 2 tabs at onset; repeat once in 12 hours.  Total 4 tabs per outbreak 12 tablet 3     Allergies   Allergen Reactions    Fruit Blend Flavor [Flavoring Agent] Anaphylaxis and Swelling    Adhesive [Wound Dressing Adhesive]      dermabond    Latex  Other (See Comments), Hives and Swelling     Swelling and redness throughout body   Also severe allergy to surgical glue    Oxycodone Other (See Comments)     Hallucinations    Other Itching and Rash     Tropical fruits like coconuts, pineapple, kiwi, etc.          PHYSICAL EXAM  Vitals:    11/24/21 1035   BP: 132/75   Pulse: (!) 51         General- WNWD, alert and oriented, NAD  Eyes- EOMI, PERRL, no conjunctival injection, no scleral icterus  ENMT- face symmetric without lesions  Skin- healing pathergy round R anterior lower arm, Left foot, faint healing urticaria over chest  CV: nl S1/S2, no m/r/g  Pulm: CTA B, no w/r/r, no conversational dyspnea  Neuro - no notable neurological deficits, no facial asymmetry    MSK:  R 2/3 PIP TTP, no swelling  DIP: no synovitis  MCP: no synovitis  Wrists: no synovitis  Elbows: FROM, no effusions  Shoulders: FROM  Ankles: no effusions  MTP: no TTP    .        LABS:  Lab Results   Component Value Date    WBC 8.0 01/25/2021    HGB 14.9 01/25/2021    HCT 45.4 01/25/2021    MCV 93 01/25/2021    PLT 407 01/25/2021      Lab Results   Component Value Date    CREAT 0.75 02/23/2021    BUN 12 02/23/2021    NA 138 02/23/2021    K 4.7 02/23/2021    CL 102 02/23/2021    CO2 20 02/23/2021      Lab Results   Component Value Date    ALT 42 (H) 02/23/2021    AST 33 02/23/2021    ALKPHOS 72 02/23/2021    BILITOTAL 0.5 02/23/2021     Lab Results   Component Value Date    ESR 6 02/23/2021    ESR 7 01/25/2021    ESR 50 (H) 11/20/2020    ESR 11 02/06/2020    ESR 9 07/26/2019      Lab Results    Component Value Date    CRP <1 02/23/2021    CRP 2 01/25/2021    CRP 3 11/20/2020    CRP 0.2 02/06/2020      No results found for: ANA   Lab Results   Component Value Date    RHEUMFACTOR <15.0 02/06/2020      Lab Results   Component Value Date    CCP <15.6 02/06/2020     No results found for: URICACID        ASSESSMENT/PLAN:    This patient is a 41 y.o. year female with Past Medical History of Prior Cholecystectomy, Wolf-Parkinson White, Prior Hysterectomy, Fatty Liver Disease who returns for follow up of Behcet's Disease and seronegative rheumatoid arthritis.    She will continue [place] and Humira.  She is having 60-70% benefits in terms of seronegative rheumatoid arthritis with Humira and her joint pain, much improved with every ten day dosing.    Continue Otezla 30 mg twice daily for mouth sores and behcets-she has had 90% benefit in terms of her mouth sores.    Following with Dr. Theodoro Grist for Migraines/Headaches.  She will discuss vertigo symptoms with him.    We will likely recommend cardiology evaluation for bradycardia.    Recent labs normal.    1. Behcet's disease  -  CBC and differential; Future  - Comprehensive metabolic panel; Future  - Sedimentation rate (ESR); Future  - C Reactive Protein; Future    2. Aphthous ulcer  - CBC and differential; Future  - Comprehensive metabolic panel; Future  - Sedimentation rate (ESR); Future  - C Reactive Protein; Future    3. Seronegative rheumatoid arthritis  - CBC and differential; Future  - Comprehensive metabolic panel; Future  - Sedimentation rate (ESR); Future  - C Reactive Protein; Future    4. High risk medication use  - CBC and differential; Future  - Comprehensive metabolic panel; Future  - Sedimentation rate (ESR); Future  - C Reactive Protein; Future           Total Time spent reviewing records, interviewing patient and coordinating plan of care: 33 min    FOLLOW UP:  Return in about 2 months (around 01/22/2022) for Follow Up, video .                                                                                   Debe Coder, MD  Rheumatologist  [facility]  [address]  [place], [place] [address]  Phone: [phone]  Fax: [phone]

## 2021-12-02 ENCOUNTER — Telehealth (INDEPENDENT_AMBULATORY_CARE_PROVIDER_SITE_OTHER): Payer: BLUE CROSS/BLUE SHIELD | Admitting: Family Medicine

## 2021-12-02 ENCOUNTER — Encounter (INDEPENDENT_AMBULATORY_CARE_PROVIDER_SITE_OTHER): Payer: Self-pay | Admitting: Family Medicine

## 2021-12-02 DIAGNOSIS — F419 Anxiety disorder, unspecified: Secondary | ICD-10-CM

## 2021-12-02 DIAGNOSIS — J3089 Other allergic rhinitis: Secondary | ICD-10-CM

## 2021-12-02 DIAGNOSIS — R001 Bradycardia, unspecified: Secondary | ICD-10-CM

## 2021-12-02 DIAGNOSIS — K811 Chronic cholecystitis: Secondary | ICD-10-CM

## 2021-12-02 DIAGNOSIS — Z91018 Allergy to other foods: Secondary | ICD-10-CM

## 2021-12-02 DIAGNOSIS — B001 Herpesviral vesicular dermatitis: Secondary | ICD-10-CM

## 2021-12-02 DIAGNOSIS — M352 Behcet's disease: Secondary | ICD-10-CM

## 2021-12-02 MED ORDER — EPIPEN 2-PAK 0.3 MG/0.3ML IJ SOAJ
0.3000 mg | Freq: Once | INTRAMUSCULAR | 1 refills | Status: DC | PRN
Start: 2021-12-02 — End: 2022-11-25

## 2021-12-02 MED ORDER — COLESEVELAM HCL 625 MG PO TABS
625.0000 mg | ORAL_TABLET | Freq: Two times a day (BID) | ORAL | 3 refills | Status: DC
Start: 2021-12-02 — End: 2022-11-25

## 2021-12-02 MED ORDER — VALACYCLOVIR HCL 1 G PO TABS
ORAL_TABLET | ORAL | 3 refills | Status: DC
Start: 2021-12-02 — End: 2022-03-02

## 2021-12-02 MED ORDER — HYDROXYZINE HCL 10 MG PO TABS
10.0000 mg | ORAL_TABLET | Freq: Every evening | ORAL | 3 refills | Status: DC
Start: 2021-12-02 — End: 2023-01-23

## 2021-12-02 MED ORDER — BUSPIRONE HCL 5 MG PO TABS
5.0000 mg | ORAL_TABLET | Freq: Three times a day (TID) | ORAL | 0 refills | Status: DC | PRN
Start: 2021-12-02 — End: 2021-12-26

## 2021-12-02 NOTE — Progress Notes (Signed)
[facility]    Subjective:      Date: 12/02/2021 2:46 PM   Patient ID: Hailey [name] is a 41 y.o. female.    Verbal consent has been obtained from the patient to conduct a video and telephone visit: yes    Chief Complaint:  Chief Complaint   Patient presents with    Establish Care       HPI:  HPI  Patient presents to establish care, previous PCP left the area.  Pt is a Biochemist, clinical for the govt  She has known hx of Behcet's on immunosuppressive therapy (managed by rheum), WPW syndrome s/p ablation, migraine headaches, post-cholecystectomy syndrome.    # Behcet's disease  - Managed by rheum  - On immunosuppressive therapy  - Saw rheum last week, doing better on current regimen    # Hx of WPW syndrome  - S/p ablation with EP in 2005  - Lately, has been feeling some dizziness on occasion (supposed to be managed with atarax), states it is not frequent  - Also reports soft BPs  - Planning on seeing neuro and if eval normal, then planning on f/u with EP    # Migraine HA  - Has been on prn triptan  - Reasonably well controlled    # Post-cholecystectomy syndrome  - On colesevelam, symptoms well controlled  - Typically, can tell when she misses her doses because she will typically develop abdominal pain  - Needs refill    # Anxiety  - Lately, has been having more episodes of anxiety, not only dealing with her own medical issues and stress from her work, but also with her husband's recently diagnosed health issues  - Has not been on anxiety medicine in the past, but is wanting to try a medication to help keep her anxiety well controlled  - Would like to try as needed medication for now since she is not having many panic attacks, but when she had one recently it was debilitating       Problem List:  Patient Active Problem List   Diagnosis    Morbid obesity    Herpes labialis    Non-seasonal allergic rhinitis, unspecified trigger    Migraine with aura and without status migrainosus, not  intractable    BMI 45.0-49.9, adult    Post-cholecystectomy syndrome    History of Wolff-Parkinson-White (WPW) syndrome    Scoliosis deformity of spine    Behcet's disease    Immunocompromised state due to drug therapy       Current Medications:  Current Outpatient Medications   Medication Sig Dispense Refill    Adalimumab 40 MG/0.4ML Pen-injector Kit Inject 40 mg into the skin every 10 (ten) days 6 each 3    Dapsone 7.5 % Gel APPLY DAILY TO ACNE/FOLLICULITIS ON FACE      fluocinonide (LIDEX) 0.05 % gel APPLY TO AFFECTED SORES ON SKIN OR R MOUTH TWICE DAILY UNTIL RESOLVED.      Galcanezumab-gnlm (Emgality) 120 MG/ML Solution Auto-injector Inject 240 mg into the skin every 30 (thirty) days for 30 days, THEN 120 mg every 30 (thirty) days. 4 each 11    Multiple Vitamins-Minerals (MULTIVITAMIN ADULT PO) Take by mouth daily         Otezla 30 MG Tab TAKE 1 TABLET BY MOUTH 2 TIMES A DAY. 60 tablet 2    Azelastine-Fluticasone 137-50 MCG/ACT Suspension 2 (two) times daily as needed      busPIRone (BUSPAR) 5 MG tablet Take  1 tablet (5 mg) by mouth 3 (three) times daily as needed (anxiety) 90 tablet 0    clindamycin (CLEOCIN T) 1 % lotion APPLY TWICE A DAY TO BREAKOUTS ON FACE, WASH WITH OVER THE COUNTER BENZOYL PEROXIDE BEFORE HAND      colesevelam (WELCHOL) 625 MG tablet Take 1 tablet (625 mg) by mouth 2 (two) times daily with meals 180 tablet 3    EPINEPHrine (EPIPEN 2-PAK) 0.3 MG/0.3ML Solution Auto-injector injection Inject 0.3 mLs (0.3 mg) into the muscle once as needed (Anaphylaxis) ; go to ER after injection 1 each 1    hydrOXYzine (ATARAX) 10 MG tablet Take 1 tablet (10 mg) by mouth nightly 90 tablet 3    lidocaine-prilocaine (EMLA) cream       ondansetron (ZOFRAN-ODT) 4 MG disintegrating tablet Take 1 tablet (4 mg total) by mouth every 8 (eight) hours as needed for Nausea 30 tablet 0    rizatriptan (MAXALT) 10 MG tablet Take 1-2 tablets (10-20 mg total) by mouth once as needed for Migraine Max 3 days a week 36 tablet  3    valACYclovir (VALTREX) 1000 MG tablet Take 2 tabs at onset; repeat once in 12 hours.  Total 4 tabs per outbreak 12 tablet 3     No current facility-administered medications for this visit.       Allergies:  Allergies   Allergen Reactions    Fruit Blend Flavor [Flavoring Agent] Anaphylaxis and Swelling    Adhesive [Wound Dressing Adhesive]      dermabond    Latex Other (See Comments), Hives and Swelling     Swelling and redness throughout body   Also severe allergy to surgical glue    Oxycodone Other (See Comments)     Hallucinations    Other Itching and Rash     Tropical fruits like coconuts, pineapple, kiwi, etc.       Past Medical History:  Past Medical History:   Diagnosis Date    Abnormal Pap smear of cervix     Anemia ~12    Pre-hysterectomy    Behcet's disease     BMI 45.0-49.9, adult     Breast lump     Diarrhea     occasional    Fever     for past ~ 3 months (avg daily fever 100-101)    Gastroesophageal reflux disease     Headache     Herpes simplex virus (HSV) infection     Meningitis     Post-operative nausea and vomiting     Scoliosis     Swollen lymph nodes        Past Surgical History:  Past Surgical History:   Procedure Laterality Date    ablasion  2005    ARTHROTOMY, WRIST Right 12/03/2020    Procedure: RIGHT UPPER EXTREMITY SYNOVIAL BIOPSY;  Surgeon: Kellie Simmering, MD;  Location: [facility];  Service: Plastics;  Laterality: Right;    BIOPSY, LYMPH NODE N/A 07/23/2018    Procedure: BIOPSY, LYMPH NODE;  Surgeon: Shelda Pal, MD;  Location: [phone];  Service: ENT;  Laterality: N/A;  NECK DEEP LYMPH NODE BIOPSY    BONE MARROW BIOPSY  2020    CHOLECYSTECTOMY  2009    D&C DIAGNOSTIC  2010    DILATION AND CURETTAGE OF UTERUS  2010    FINGER GANGLION CYST EXCISION  04/2018    GANGLION CYST EXCISION  2014    04/2018 cyst removed from index finger-[facility]  HERNIA REPAIR  2016    HYSTERECTOMY  2012    PELVIC LAPAROSCOPY  2011    UMBILICAL HERNIA REPAIR  2016    UPPER  GASTROINTESTINAL ENDOSCOPY  2009    WISDOM TOOTH EXTRACTION  1998       Family History:  Family History   Adopted: Yes       Social History:  Social History     Socioeconomic History    Marital status: Married    Number of children: 0   Occupational History    Occupation: Pensions consultant    Tobacco Use    Smoking status: Never    Smokeless tobacco: Never   Vaping Use    Vaping Use: Never used   Substance and Sexual Activity    Alcohol use: Not Currently     Alcohol/week: 0.0 standard drinks     Comment: rarely    Drug use: Never    Sexual activity: Yes     Partners: Male     Birth control/protection: None     Comment: Hysterectomy   Social History Narrative    Diet: Non specific         Caffeine per day: tea 1 cup         ACD: N        Medical POA: N        DNR: N        Exercise: N     Social Determinants of Psychologist, prison and probation services Strain: Low Risk     Difficulty of Paying Living Expenses: Not hard at all   Food Insecurity: No Food Insecurity    Worried About Programme researcher, broadcasting/film/video in the Last Year: Never true    Ran Out of Food in the Last Year: Never true   Transportation Needs: No Transportation Needs    Lack of Transportation (Medical): No    Lack of Transportation (Non-Medical): No   Physical Activity: Unknown    Days of Exercise per Week: Patient refused   Stress: Stress Concern Present    Feeling of Stress : To some extent   Social Connections: Socially Integrated    Frequency of Communication with Friends and Family: Three times a week    Frequency of Social Gatherings with Friends and Family: Once a week    Attends Religious Services: More than 4 times per year    Active Member of [employer] or Organizations: Yes    Attends Engineer, structural: More than 4 times per year    Marital Status: Married   Catering manager Violence: Not At Risk    Fear of Current or Ex-Partner: No    Emotionally Abused: No    Physically Abused: No    Sexually Abused: No   Housing Stability: Low Risk     Unable to Pay for Housing  in the Last Year: No    Number of Places Lived in the Last Year: 1    Unstable Housing in the Last Year: No        The following sections were reviewed this encounter by the provider:   Tobacco   Allergies   Meds   Problems   Med Hx   Surg Hx   Fam Hx          ROS:  Review of Systems   Constitutional:  Positive for fatigue. Negative for chills and fever.   Respiratory:  Negative for cough, shortness of breath and wheezing.  Cardiovascular:  Negative for chest pain and leg swelling.   Gastrointestinal:  Negative for abdominal pain, diarrhea, nausea and vomiting.   Neurological:  Negative for weakness, light-headedness, numbness and headaches.   Psychiatric/Behavioral:  The patient is nervous/anxious.       Objective:     Physical Exam:  Physical Exam  Nursing note reviewed.   Constitutional:       Appearance: Normal appearance.   HENT:      Mouth/Throat:      Mouth: Mucous membranes are moist.   Eyes:      Extraocular Movements: Extraocular movements intact.   Pulmonary:      Effort: Pulmonary effort is normal.   Neurological:      Mental Status: She is alert.   Psychiatric:         Mood and Affect: Mood normal.       Assessment/Plan:       1. Behcet's disease  - Managed by rheum    2. Chronic cholecystitis  - Well controlled on current medication. Continue current medication regimen.  - colesevelam (WELCHOL) 625 MG tablet; Take 1 tablet (625 mg) by mouth 2 (two) times daily with meals  Dispense: 180 tablet; Refill: 3    3. Bradycardia  - Pt having blood work done soon, recommended having pt come in for her CPE so we can have EKG done and draw TSH (last EKG was prior to her ablation and was normal)  - Will plan on having her f/u with EP for further eval of this issue.  - TSH, Abn Reflex to Free T4, Serum; Future  - Arrhythmia Referral: Albertine GratesMarc Wish, MD [facility]    4. Anxiety  - Discussed different options for medical therapy. Already on atarax, which is too sedating at low dose to manage symptoms in panic attack.  Will use buspar off-label as needed for anxiety for now  - Discussed role of benzodiazepine use in panic attacks and agreed that it would be ok to use if buspar does not help and as long as she is not having many panic attacks (No more than 2 panic attacks per month). If her anxiety is too uncontrolled, would then opt to have her take SSRI daily.  - busPIRone (BUSPAR) 5 MG tablet; Take 1 tablet (5 mg) by mouth 3 (three) times daily as needed (anxiety)  Dispense: 90 tablet; Refill: 0    5. Non-seasonal allergic rhinitis, unspecified trigger  - hydrOXYzine (ATARAX) 10 MG tablet; Take 1 tablet (10 mg) by mouth nightly  Dispense: 90 tablet; Refill: 3    6. Herpes labialis  - valACYclovir (VALTREX) 1000 MG tablet; Take 2 tabs at onset; repeat once in 12 hours.  Total 4 tabs per outbreak  Dispense: 12 tablet; Refill: 3    7. H/O food allergy  - EPINEPHrine (EPIPEN 2-PAK) 0.3 MG/0.3ML Solution Auto-injector injection; Inject 0.3 mLs (0.3 mg) into the muscle once as needed (Anaphylaxis) ; go to ER after injection  Dispense: 1 each; Refill: 1      Return in about 4 weeks (around 12/30/2021) for Annual Physical.    Virgilio Freeshristian Loreley Schwall, MD

## 2021-12-15 ENCOUNTER — Ambulatory Visit: Payer: BLUE CROSS/BLUE SHIELD | Attending: Neurology | Admitting: Neurology

## 2021-12-15 ENCOUNTER — Ambulatory Visit: Payer: BLUE CROSS/BLUE SHIELD

## 2021-12-15 ENCOUNTER — Encounter: Payer: Self-pay | Admitting: Neurology

## 2021-12-15 ENCOUNTER — Telehealth: Payer: Self-pay

## 2021-12-15 VITALS — BP 130/90 | HR 96 | Temp 97.5°F | Resp 18 | Ht 68.0 in | Wt 315.0 lb

## 2021-12-15 DIAGNOSIS — M5090 Cervical disc disorder, unspecified, unspecified cervical region: Secondary | ICD-10-CM | POA: Insufficient documentation

## 2021-12-15 DIAGNOSIS — M352 Behcet's disease: Secondary | ICD-10-CM | POA: Insufficient documentation

## 2021-12-15 DIAGNOSIS — G43109 Migraine with aura, not intractable, without status migrainosus: Secondary | ICD-10-CM | POA: Insufficient documentation

## 2021-12-15 DIAGNOSIS — R42 Dizziness and giddiness: Secondary | ICD-10-CM | POA: Insufficient documentation

## 2021-12-15 MED ORDER — RIMEGEPANT SULFATE 75 MG PO TBDP
75.0000 mg | ORAL_TABLET | Freq: Every day | ORAL | 11 refills | Status: DC | PRN
Start: 2021-12-15 — End: 2022-04-29

## 2021-12-15 NOTE — Progress Notes (Signed)
12/15/21 1200   Pain History   Pain Symptoms Pain   Pain Location Neck;Lower Back;Shoulder  Right;Hip  Right   Pain Description Dull/Aching;Sharp/Stabbing;Chronic(>3 months)   Pain Frequency Constant   Foot Drop No   Incontinence No   Treatments   Have you visited a chiropractor for this problem? No   Have you ever had physical therapy for this problem? No   Have you ever received an injection for this problem? No   Diagnostic Tests   MRI Lumbar;Thoracic;Cervical  (in epic from 04/2021)   Care Management   How did you hear about our program MD Referral   ISP Appointment   ISP Physician Peggye Ley   ISP Appointment Date 12/17/21  (1:30pm)   ISP Appointment Location [facility]/ Peggye Ley

## 2021-12-15 NOTE — Progress Notes (Unsigned)
[facility]  MULTIPLE SCLEROSIS & NEUROIMMUNOLOGY CENTER  Main Line / Scheduling / on-call: [phone]  Clinic Tel: [phone]  Fax: [phone]  Send [place] a mychart or inbasket message for the fastest response  ~~~~~~~~~~~~~~~~~~~~~~~~~~~~~~~~~~~~~~~~~~~~~~~~~~~~~~~~~~~~~~~~~~~~~~~~~~~~~~~~~~~~    IN-PERSON    CC: Headaches    HPI:   History was obtained from patient,     41 y.o. year-old female with migraines headaches. Significant dizziness. Even with Emgality, feels 15 headaches/month. Rizatriptan helps some.     Hx of Behchet's, on humira.     Review of Previous records reveals:        EXAM  Visit Vitals  BP 130/90 (BP Site: Left arm, Patient Position: Sitting, Cuff Size: Large)   Pulse 96   Temp 97.5 F (36.4 C) (Oral)   Resp 18   Ht 1.727 m (5\' 8" )   Wt 142.9 kg (315 lb)   LMP  (LMP Unknown)   SpO2 97%   BMI 47.90 kg/m     General:   Fundoscopic:   Psychiatric.   Mental Status: The patient was awake, alert, appropriate  Cranial Nerves: CN II-XII wnl  Motor: 5/5  Reflex:   Sensation:   Coordination:   Gait: normal. Tandems well      Imaging (my summary based on personal review of images). Reviewed in Epic.       Testing (EMG/NCS, EKG, EEG, Echo, Evoked potentials) - Reviewed in Epic.       Labs. Reviewed in epic. See below  Lab Results   Component Value Date    HGBA1C 5.7 (H) 09/15/2017    GLU 96 02/23/2021    EGFR 103 02/23/2021     Lab Results   Component Value Date    VITD 31 02/06/2020    CREAT 0.75 02/23/2021    WBC 8.0 01/25/2021        ASSESSMENT / PLAN  RTC: Return for Headache (Kastl / Cinski).    1. Migraine with aura and without status migrainosus, not intractable    2. Cervical disc disease    3. Behcet's disease    4. Dizziness      40y/o woman with migraine, intractable. Refer to headache clinic. Refer to spine clinic for C spine DJD w/ foraminal stenosis, as that can exacerbate / contribute to headache.     She has Behcet's disease and seronegative RA, on Humira and [place]. There  is  no MRI evidence of CNS complication from either disease. Neuro exam is stable and therefore repeat imaging is not necessary. If behcet's becomes poorly controlled and/or she develops new neuro sx not explainable by migraine, she should return to neuroimmunology clinic. The migraines are likely separate from her rheumatologic disease.    Migraine: failed rizatriptan, imitrex, emgality, buspar, topamax, reglan, propranolol, hydroxyzine    PLAN:  Rx: nurtec 75mg  daily prn migraine  Continue rizatriptan prn  F/u headache neurology    Orders today (details below)  Orders Placed This Encounter   Procedures    Referral to [facility]     Orders Placed This Encounter   Medications    Rimegepant Sulfate 75 MG Tablet Dispersible     Sig: Take 1 tablet (75 mg) by mouth daily as needed (headache)     Dispense:  30 tablet     Refill:  11     rizatriptan, imitrex, emgality, buspar, topamax, reglan, propranolol, hydroxyzine     Order Specific Question:   Pharmacy Preference:     Answer:  Opted into [facility]     Order Specific Question:   Did Patient Give Verbal Consent to Change to [place]?     Answer:   Yes     Order Specific Question:   Change to [facility]:     Answer:   New Patient Enrollment - Patient Opted into [facility]     Order Specific Question:   Change Pharm[facility] To:     [facility][id number]     Order Specific Question:   Prescription is New or Refill:     Answer:   New     Order Specific Question:   Prior Authorization Completed:     Answer:   Yes     Order Specific Question:   Appeal Completed:     Answer:   Not Applicable     Order Specific Question:   Financial Assistance Secure:     Answer:   Not Applicable     Order Specific Question:   Free Drug Secured:     Answer:   Not Applicable     Order Specific Question:   Restricted Drug:     Answer:   Not Applicable     Order Specific Question:   Restricted Payor:     Answer:   Not Applicable     Order  Specific Question:   Has Liaison seen Patient in person:     Answer:   Not Applicable       I spent 40 minutes reviewing the records, talking with the patient and developing their care plan.      Lora Havens, MD PHD  Director, Neuroimmunology & MS Center  [facility]    [address], [place], [place] [address]  Main line / after-hours on-call: [phone]      For direct access, contact Allayne Stack RN  Tel [phone]   Fax [phone]   ===================================================================    PATIENT INSTRUCTIONS PROVIDED  Patient was given an "After Visit Summary" with a copy of the testing orders, medications and the following other instructions:  Patient Instructions   Do not take abortives more than 2-3 days a week combined, as these can worsen headaches (rebound headache). These include over-the-counter pain meds, triptans. Tramadol & narcotics worsen headaches if used even once a week.    The following supplements can help some patients:  Magnesium Oxide 400mg  twice a day  Coenzyme Q10 (CoQ10) 100mg  twice a day  Riboflavin 100mg  twice a day  Vitamin B12 1000 mcg once a day      Lora Havens, MD PHD  Director, Neuroimmunology & MS Center  [facility]    [address], [place], [place] [address]  Main line / after-hours on-call: [phone]      For direct access, contact Allayne Stack RN  Tel [phone]   Fax [phone]       REFERRAL / CARE COORDINATION  see communication section, referrals section, EPIC notes  Referring MD:No referring provider defined for this encounter.  Fax:   Primary Care on file: Virgilio Frees, MD  Tel: [phone]   Fax: [phone]  Care Team:Patient Care Team:  Virgilio Frees, MD as PCP - General (Family Medicine)  Manfred Arch D  Provider, Generic Hpf (Inactive)  Tyler Aas,  [place]  Tivis Ringer, MD as  Consulting Physician (Hematology)  Lu Duffel, MD as Consulting Physician (Obstetrics and Gynecology)    IMPORTED INFORMATION FROM MEDICAL RECORDS    ICD-10-CM    1. Migraine with aura and without status migrainosus, not intractable  G43.109 Referral to [facility]      2. Cervical disc disease  M50.90 Referral to [facility]      3. Behcet's disease  M35.2 Referral to [facility]      4. Dizziness  R42 Referral to [facility]        Orders Placed This Encounter   Procedures    Referral to [facility]     Referral Priority:   Routine     Referral Type:   Consultation     Referral Reason:   Specialty Services Required     Requested Specialty:   Spine Surgery     Number of Visits Requested:   1

## 2021-12-15 NOTE — Telephone Encounter (Signed)
Dr. Theodoro Grist,    Pt has [place] BCBS and they often only cover 1 CGRP.     You've prescribed Nurtec today - will you be d/cing the Emgality?

## 2021-12-15 NOTE — Patient Instructions (Signed)
Do not take abortives more than 2-3 days a week combined, as these can worsen headaches (rebound headache). These include over-the-counter pain meds, triptans. Tramadol & narcotics worsen headaches if used even once a week.    The following supplements can help some patients:  Magnesium Oxide 400mg twice a day  Coenzyme Q10 (CoQ10) 100mg twice a day  Riboflavin 100mg twice a day  Vitamin B12 1000 mcg once a day      Makyah Lavigne, MD PHD  Director, Neuroimmunology & MS Center  [facility]  [employer]    [address], [place], [place] [address]  Main line / after-hours on-call: [phone]      For direct access, contact Cheryl Robson RN  Tel [phone]   Fax [phone]

## 2021-12-15 NOTE — Telephone Encounter (Signed)
Received request via [employer] and beginning benefits investigation for Nurtec ODT 75mg      Take 1 tab by mouth daily as needed    PA required. Initiated via [facility]

## 2021-12-16 NOTE — Telephone Encounter (Signed)
Prior Authorization for Nurtec ODT 75mg     PA Outcome APPROVED    Case ID [id number]    Effective 11/15/2021 - 06/13/2022    Authorization is valid for 56 every 90 days    $908.31 for #16/30-day supply    Pt is eligible to sign up for copay savings card

## 2021-12-20 ENCOUNTER — Other Ambulatory Visit (INDEPENDENT_AMBULATORY_CARE_PROVIDER_SITE_OTHER): Payer: Self-pay | Admitting: Neurological Surgery

## 2021-12-21 ENCOUNTER — Encounter: Payer: Self-pay | Admitting: No Specialty

## 2021-12-24 ENCOUNTER — Other Ambulatory Visit (INDEPENDENT_AMBULATORY_CARE_PROVIDER_SITE_OTHER): Payer: Self-pay | Admitting: Family Medicine

## 2021-12-24 DIAGNOSIS — F419 Anxiety disorder, unspecified: Secondary | ICD-10-CM

## 2022-01-14 ENCOUNTER — Ambulatory Visit (INDEPENDENT_AMBULATORY_CARE_PROVIDER_SITE_OTHER): Payer: BLUE CROSS/BLUE SHIELD | Admitting: Family Medicine

## 2022-01-14 ENCOUNTER — Encounter (INDEPENDENT_AMBULATORY_CARE_PROVIDER_SITE_OTHER): Payer: Self-pay | Admitting: Family Medicine

## 2022-01-14 VITALS — BP 124/79 | HR 94 | Temp 97.4°F | Resp 20 | Ht 66.34 in | Wt 315.8 lb

## 2022-01-14 DIAGNOSIS — Z90710 Acquired absence of both cervix and uterus: Secondary | ICD-10-CM | POA: Insufficient documentation

## 2022-01-14 DIAGNOSIS — K915 Postcholecystectomy syndrome: Secondary | ICD-10-CM

## 2022-01-14 DIAGNOSIS — K120 Recurrent oral aphthae: Secondary | ICD-10-CM

## 2022-01-14 DIAGNOSIS — K811 Chronic cholecystitis: Secondary | ICD-10-CM

## 2022-01-14 DIAGNOSIS — Z8679 Personal history of other diseases of the circulatory system: Secondary | ICD-10-CM

## 2022-01-14 DIAGNOSIS — Z79899 Other long term (current) drug therapy: Secondary | ICD-10-CM

## 2022-01-14 DIAGNOSIS — J3089 Other allergic rhinitis: Secondary | ICD-10-CM

## 2022-01-14 DIAGNOSIS — M352 Behcet's disease: Secondary | ICD-10-CM

## 2022-01-14 DIAGNOSIS — B001 Herpesviral vesicular dermatitis: Secondary | ICD-10-CM

## 2022-01-14 DIAGNOSIS — G43109 Migraine with aura, not intractable, without status migrainosus: Secondary | ICD-10-CM

## 2022-01-14 DIAGNOSIS — D84821 Immunodeficiency due to drugs: Secondary | ICD-10-CM

## 2022-01-14 DIAGNOSIS — Z0000 Encounter for general adult medical examination without abnormal findings: Secondary | ICD-10-CM

## 2022-01-14 DIAGNOSIS — R110 Nausea: Secondary | ICD-10-CM

## 2022-01-14 MED ORDER — ONDANSETRON 4 MG PO TBDP
4.0000 mg | ORAL_TABLET | Freq: Three times a day (TID) | ORAL | 0 refills | Status: DC | PRN
Start: 2022-01-14 — End: 2022-04-25

## 2022-01-14 NOTE — Progress Notes (Signed)
Have you seen any specialists since your last visit with [place]?  Yes  Neuro  Neuro surgeon  The patient was informed that the following HM items are still outstanding:       Nothing at this time.

## 2022-01-14 NOTE — Progress Notes (Signed)
Subjective:      Patient ID: Hailey [name] is a 42 y.o. female.    Chief Complaint:  Chief Complaint   Patient presents with    Annual Exam     Fasting        HPI  Visit Type: Health Maintenance Visit  Work Status: working full-time  Reported Health: good health  Reported Diet: moderate compliance with well-balanced diet  Reported Exercise: none  Dental: regular dental visits twice a year  Vision: glasses, contact lenses, and regular eye exams   Hearing: normal hearing  Immunization Status: immunizations up to date  Reproductive Health: sexually active and s/p hysterectomy  Prior Screening Tests: no previous colorectal cancer screening, pap smear 1 year ago, and mammogram 1 year ago  General Health Risks:  Adopted, no fhx  Safety Elements Used: uses seat belts and smoke detectors in household  Depression Screening: Negative    Problem List:  Patient Active Problem List   Diagnosis    Morbid obesity    Herpes labialis    Non-seasonal allergic rhinitis, unspecified trigger    Migraine with aura and without status migrainosus, not intractable    Post-cholecystectomy syndrome    History of Wolff-Parkinson-White (WPW) syndrome    Scoliosis deformity of spine    Behcet's disease    Immunocompromised state due to drug therapy    S/P hysterectomy       Current Medications:  Outpatient Medications Marked as Taking for the 01/14/22 encounter (Office Visit) with Virgilio Frees, MD   Medication Sig Dispense Refill    Adalimumab 40 MG/0.4ML Pen-injector Kit Inject 40 mg into the skin every 10 (ten) days 6 each 3    Azelastine-Fluticasone 137-50 MCG/ACT Suspension 2 (two) times daily as needed      busPIRone (BUSPAR) 5 MG tablet TAKE 1 TABLET (5 MG) BY MOUTH 3 (THREE) TIMES DAILY AS NEEDED (ANXIETY) 270 tablet 0    clindamycin (CLEOCIN T) 1 % lotion APPLY TWICE A DAY TO BREAKOUTS ON FACE, WASH WITH OVER THE COUNTER BENZOYL PEROXIDE BEFORE HAND      colesevelam (WELCHOL) 625 MG tablet Take 1 tablet (625 mg) by mouth 2  (two) times daily with meals 180 tablet 3    Dapsone 7.5 % Gel APPLY DAILY TO ACNE/FOLLICULITIS ON FACE      fluocinonide (LIDEX) 0.05 % gel APPLY TO AFFECTED SORES ON SKIN OR R MOUTH TWICE DAILY UNTIL RESOLVED.      hydrOXYzine (ATARAX) 10 MG tablet Take 1 tablet (10 mg) by mouth nightly 90 tablet 3    lidocaine-prilocaine (EMLA) cream       Multiple Vitamins-Minerals (MULTIVITAMIN ADULT PO) Take by mouth daily         Otezla 30 MG Tab TAKE 1 TABLET BY MOUTH 2 TIMES A DAY. 60 tablet 2    Rimegepant Sulfate 75 MG Tablet Dispersible Take 1 tablet (75 mg) by mouth daily as needed (headache) 30 tablet 11    rizatriptan (MAXALT) 10 MG tablet Take 1-2 tablets (10-20 mg total) by mouth once as needed for Migraine Max 3 days a week 36 tablet 3    valACYclovir (VALTREX) 1000 MG tablet Take 2 tabs at onset; repeat once in 12 hours.  Total 4 tabs per outbreak 12 tablet 3    [DISCONTINUED] ondansetron (ZOFRAN-ODT) 4 MG disintegrating tablet Take 1 tablet (4 mg total) by mouth every 8 (eight) hours as needed for Nausea 30 tablet 0       Allergies:  Allergies  Allergen Reactions    Fruit Blend Flavor [Flavoring Agent] Anaphylaxis and Swelling    Adhesive [Wound Dressing Adhesive]      dermabond    Latex Other (See Comments), Hives and Swelling     Swelling and redness throughout body   Also severe allergy to surgical glue    Oxycodone Other (See Comments)     Hallucinations    Other Itching and Rash     Tropical fruits like coconuts, pineapple, kiwi, etc.       Past Medical History:  Past Medical History:   Diagnosis Date    Abnormal Pap smear of cervix     Anemia ~12    Pre-hysterectomy    Behcet's disease     BMI 45.0-49.9, adult     Breast lump     Diarrhea     occasional    Fever     for past ~ 3 months (avg daily fever 100-101)    Gastroesophageal reflux disease     Headache     Herpes simplex virus (HSV) infection     Lower back pain     Meningitis     Neck pain     Post-operative nausea and vomiting     Scoliosis      Swollen lymph nodes        Past Surgical History:  Past Surgical History:   Procedure Laterality Date    ablasion  2005    ARTHROTOMY, WRIST Right 12/03/2020    Procedure: RIGHT UPPER EXTREMITY SYNOVIAL BIOPSY;  Surgeon: Kellie Simmering, MD;  Location: [facility];  Service: Plastics;  Laterality: Right;    BIOPSY, LYMPH NODE N/A 07/23/2018    Procedure: BIOPSY, LYMPH NODE;  Surgeon: Shelda Pal, MD;  Location: [facility];  Service: ENT;  Laterality: N/A;  NECK DEEP LYMPH NODE BIOPSY    BONE MARROW BIOPSY  2020    CHOLECYSTECTOMY  2009    D&C DIAGNOSTIC  2010    DILATION AND CURETTAGE OF UTERUS  2010    FINGER GANGLION CYST EXCISION  04/2018    GANGLION CYST EXCISION  2014    04/2018 cyst removed from index finger-[facility]    HERNIA REPAIR  2016    HYSTERECTOMY  2012    PELVIC LAPAROSCOPY  2011    UMBILICAL HERNIA REPAIR  2016    UPPER GASTROINTESTINAL ENDOSCOPY  2009    WISDOM TOOTH EXTRACTION  1998       Family History:  Family History   Adopted: Yes       Social History:  Social History     Tobacco Use    Smoking status: Never    Smokeless tobacco: Never   Vaping Use    Vaping Use: Never used   Substance Use Topics    Alcohol use: Not Currently     Alcohol/week: 0.0 standard drinks     Comment: rarely    Drug use: Never          The following sections were reviewed this encounter by the provider:   Tobacco  Allergies  Meds  Problems  Med Hx  Surg Hx  Fam Hx           Review of Systems   Constitutional:  Negative for chills, fatigue and fever.   HENT:  Negative for congestion, ear pain, rhinorrhea and sore throat.    Eyes:  Negative for visual disturbance.   Respiratory:  Negative for cough, shortness of breath and  wheezing.    Cardiovascular:  Negative for chest pain, palpitations and leg swelling.   Gastrointestinal:  Positive for nausea. Negative for abdominal pain, blood in stool, diarrhea and vomiting.   Genitourinary:  Negative for difficulty urinating, dysuria, hematuria,  menstrual problem, vaginal bleeding, vaginal discharge and vaginal pain.   Musculoskeletal:  Negative for arthralgias and myalgias.   Skin:  Negative for rash.   Neurological:  Positive for headaches. Negative for dizziness, weakness, light-headedness and numbness.   Hematological:  Negative for adenopathy.   Psychiatric/Behavioral:  Negative for confusion.       Objective:   Vitals:  BP 124/79   Pulse 94   Temp 97.4 F (36.3 C)   Resp 20   Ht 1.685 m (5' 6.34")   Wt 143.2 kg (315 lb 12.8 oz)   LMP  (LMP Unknown)   SpO2 97%   BMI 50.45 kg/m     Physical Exam  Vitals reviewed.   Constitutional:       Appearance: Normal appearance.   HENT:      Head: Normocephalic.      Nose: Nose normal.      Mouth/Throat:      Mouth: Mucous membranes are moist.   Eyes:      Extraocular Movements: Extraocular movements intact.      Pupils: Pupils are equal, round, and reactive to light.   Cardiovascular:      Rate and Rhythm: Normal rate and regular rhythm.      Pulses: Normal pulses.      Heart sounds: Normal heart sounds. No murmur heard.  Pulmonary:      Effort: Pulmonary effort is normal. No respiratory distress.      Breath sounds: Normal breath sounds. No wheezing or rales.   Abdominal:      General: Bowel sounds are normal. There is no distension.      Palpations: Abdomen is soft.      Tenderness: There is no abdominal tenderness.   Musculoskeletal:         General: Normal range of motion.      Cervical back: Normal range of motion.   Lymphadenopathy:      Cervical: No cervical adenopathy.   Skin:     General: Skin is warm.   Neurological:      General: No focal deficit present.      Mental Status: She is alert.      Cranial Nerves: No cranial nerve deficit.   Psychiatric:         Mood and Affect: Mood normal.        Assessment/Plan:       1. Annual physical exam    2. Behcet's disease  - ondansetron (ZOFRAN-ODT) 4 MG disintegrating tablet; Take 1 tablet (4 mg) by mouth every 8 (eight) hours as needed for Nausea   Dispense: 30 tablet; Refill: 0    3. Aphthous ulcer  - ondansetron (ZOFRAN-ODT) 4 MG disintegrating tablet; Take 1 tablet (4 mg) by mouth every 8 (eight) hours as needed for Nausea  Dispense: 30 tablet; Refill: 0    4. Nausea  - ondansetron (ZOFRAN-ODT) 4 MG disintegrating tablet; Take 1 tablet (4 mg) by mouth every 8 (eight) hours as needed for Nausea  Dispense: 30 tablet; Refill: 0    5. Chronic cholecystitis    6. Morbid obesity  - TSH, Abn Reflex to Free T4, Serum  - Lipid panel    7. Migraine with aura and without status migrainosus, not intractable  8. Non-seasonal allergic rhinitis, unspecified trigger    9. Herpes labialis    10. Post-cholecystectomy syndrome    11. History of Wolff-Parkinson-White (WPW) syndrome    12. Immunocompromised state due to drug therapy    13. S/P hysterectomy      Health Maintenance:  High BMI follow-up: Encouragement to Exercise. Recommend optimizing low carbohydrate diet efforts and obtaining at least 150 minutes of aerobic exercise per week. Recommend 20-25 grams of dietary fiber daily. Recommend drinking at least 60-80 ounces of water per day.  Immunizations UTD. Vision screening UTD. Dental Screening UTD. Mammogram screening is UTD. Gynecologist surveillance is UTD.      Return in about 1 year (around 01/15/2023) for Annual Physical.    Virgilio Frees, MD

## 2022-01-15 LAB — COMPREHENSIVE METABOLIC PANEL
ALT: 28 IU/L (ref 0–32)
AST (SGOT): 22 IU/L (ref 0–40)
Albumin/Globulin Ratio: 1.8 (ref 1.2–2.2)
Albumin: 4.8 g/dL (ref 3.8–4.8)
Alkaline Phosphatase: 62 IU/L (ref 44–121)
BUN / Creatinine Ratio: 16 (ref 9–23)
BUN: 13 mg/dL (ref 6–24)
Bilirubin, Total: 0.4 mg/dL (ref 0.0–1.2)
CO2: 18 mmol/L — ABNORMAL LOW (ref 20–29)
Calcium: 9.8 mg/dL (ref 8.7–10.2)
Chloride: 105 mmol/L (ref 96–106)
Creatinine: 0.82 mg/dL (ref 0.57–1.00)
Globulin, Total: 2.6 g/dL (ref 1.5–4.5)
Glucose: 99 mg/dL (ref 70–99)
Potassium: 5.1 mmol/L (ref 3.5–5.2)
Protein, Total: 7.4 g/dL (ref 6.0–8.5)
Sodium: 141 mmol/L (ref 134–144)
eGFR: 92 mL/min/{1.73_m2} (ref >59)

## 2022-01-15 LAB — CBC AND DIFFERENTIAL
Baso(Absolute): 0 10*3/uL (ref 0.0–0.2)
Basophils Automated: 0 %
Eosinophils Absolute: 0.1 10*3/uL (ref 0.0–0.4)
Eosinophils Automated: 1 %
Hematocrit: 44.4 % (ref 34.0–46.6)
Hemoglobin: 15.3 g/dL (ref 11.1–15.9)
Immature Granulocytes Absolute: 0 10*3/uL (ref 0.0–0.1)
Immature Granulocytes: 0 %
Lymphocytes Absolute: 2.7 10*3/uL (ref 0.7–3.1)
Lymphocytes Automated: 30 %
MCH: 31.1 pg (ref 26.6–33.0)
MCHC: 34.5 g/dL (ref 31.5–35.7)
MCV: 90 fL (ref 79–97)
Monocytes Absolute: 0.6 10*3/uL (ref 0.1–0.9)
Monocytes: 6 %
Neutrophils Absolute Count: 5.7 10*3/uL (ref 1.4–7.0)
Neutrophils: 63 %
Platelets: 370 10*3/uL (ref 150–450)
RBC: 4.92 x10E6/uL (ref 3.77–5.28)
RDW: 12.6 % (ref 11.7–15.4)
WBC: 9.1 10*3/uL (ref 3.4–10.8)

## 2022-01-15 LAB — VITAMIN D,25 OH,TOTAL: Vitamin D 25-Hydroxy: 31.9 ng/mL (ref 30.0–100.0)

## 2022-01-15 LAB — THYROID STIMULATING HORMONE (TSH), REFLEX ON ABNORMAL TO FREE T4, SERUM: TSH: 2.04 u[IU]/mL (ref 0.450–4.500)

## 2022-01-15 LAB — SEDIMENTATION RATE: Sed Rate: 15 mm/hr (ref 0–32)

## 2022-01-15 LAB — C-REACTIVE PROTEIN: C-Reactive Protein: 1 mg/L (ref 0–10)

## 2022-01-15 LAB — LIPID PANEL
Cholesterol / HDL Ratio: 3.3 ratio (ref 0.0–4.4)
Cholesterol: 152 mg/dL (ref 100–199)
HDL: 46 mg/dL (ref >39)
LDL Chol Calculated (NIH): 87 mg/dL (ref 0–99)
Triglycerides: 103 mg/dL (ref 0–149)
VLDL Calculated: 19 mg/dL (ref 5–40)

## 2022-01-18 ENCOUNTER — Inpatient Hospital Stay: Payer: BLUE CROSS/BLUE SHIELD | Attending: Neurological Surgery | Admitting: Physical Therapist

## 2022-01-18 ENCOUNTER — Encounter: Payer: Self-pay | Admitting: Physical Therapist

## 2022-01-18 VITALS — BP 113/85 | HR 101

## 2022-01-18 DIAGNOSIS — G4486 Cervicogenic headache: Secondary | ICD-10-CM | POA: Insufficient documentation

## 2022-01-18 NOTE — Progress Notes (Signed)
Name:Hailey [name] Age: 41 y.o.   Date of Service: 01/18/2022  Referring Physician: Elder Cyphers, MD   Date of Injury: 12/15/2021  Date Care Plan Established/Reviewed: 01/18/2022  Date Treatment Started: 01/18/2022  Date Care Plan Established/Reviewed No data was found  Date Treatment Started No data was found  (Historic) Date Care Plan Established/Reviewed No data was found  (Historic) Date Treatment Started No data was found   End of Certification Date: 04/17/2022  Sessions in Plan of Care: 16  Surgery Date: No data was found  MD Follow-up: No data was found  Medbridge Code: No data was found    Visit Count: 1   Diagnosis:   1. Cervicogenic headache        Subjective     History of Present Illness   Mechanism of injury: Insidious onset with progressive worsening  History of Present Illness: Pt with history of scoliosis and recent diagnosis of Behcets syndrome (around 1 year ago) arrives to PT with reports that she was referred by her neurosurgeon due to  increase in frequency of migraines and neck pain that started last November/December alongside recent imaging that revealed arthritis and bones spurs in her cervical spine. She reports that she has already had increase in migraine medication dosage by her MD with little to no effect, so she told to try PT in order to identify if there is any musculoskeletal structural causes to her recent increase in symptoms. She describes her symptoms as migraines that either cause pressure/discomfort of the posterior occipital region of the skull bilaterally or at her L eye that radiates to the L side of the occipital region of the skull accompanied by tightness of the posterior neck muscles as well as difficulty rotating her head to either side, but especially to the R. Pt reports of no numbness/tingling nor any head trauma recently. In terms of her baseline prior to these increased symptoms, she would have 5-6 migraines per month due to her Behcets syndrome, but  currently she is having 10-15 migraines per month. In terms of imaging, she had a recent CT (results can be found above the objective section of the note).    Aggravating Factors: Sitting for prolonged periods of time (2-3 hours), looking downward to read or use phone, Cervical rotation to the R   Easing Factors: Actively moving her neck, resting with neck support, Ice    Functional Limits  Lifting objects items more than 10 lbs such as laundry  Sitting for prolonged periods of time 2-3 hours for using her computer  Difficulty with raising R UE OH for tasks due to pain    History of injury or major surgery: lymph node removal at anterior neck (2019), no major injury or surgeries to posterior neck     No Red Flags- No personal history of cancer, no dysphagia, no dysphagia, no nystagmus, no sudden falls (drop attacks)      Functional Limitations (PLOF): Difficulty/Symptomatic with sitting for prolonged periods of time for more than 2-3 hours for desk work (able to perform prior without difficulty/symptom), Difficulty/Symptomatic with lifting 10lbs or more for household duties such as laundry (able to perform prior without difficulty/symptom), Difficulty/Symptomatic with raising R UE for OH tasks such as reaching for cabinet (able to perform prior without difficulty/symptom),          Outcome Measure   Tool Used/Details: FOTO  Score: 56  Predicted Functional Outcome: 67    Pain   Location: Posterior neck and skull (  occipital region)    Social Support/Occupation    Lives in: multiple level home    Lives with: spouse    Occupation: Pensions consultant (desk)    Precautions: No data was found  Allergies: Fruit blend flavor [flavoring agent], Adhesive [wound dressing adhesive], Latex, Oxycodone, and Other    Past Medical History:   Diagnosis Date    Abnormal Pap smear of cervix     Anemia ~12    Pre-hysterectomy    Behcet's disease     BMI 45.0-49.9, adult     Breast lump     Diarrhea     occasional    Fever     for past ~ 3 months  (avg daily fever 100-101)    Gastroesophageal reflux disease     Headache     Herpes simplex virus (HSV) infection     Lower back pain     Meningitis     Neck pain     Post-operative nausea and vomiting     Scoliosis     Swollen lymph nodes      CT CERVICAL SPINE WITHOUT CONTRAST 12/21/21     HISTORY: Migraine headaches and neck pain. Cervical spondylosis.     COMPARISON: No prior CT. Correlation made with prior cervical MR dated  04/29/2021.     TECHNIQUE: CT of the cervical spine performed without intravenous contrast.  Multiplanar reformatted images were created and reviewed. The following  dose reduction techniques were utilized: automated exposure control and/or  adjustment of the mA and/or KV according to patient size, and the use of an  iterative reconstruction technique.     FINDINGS:     The cervical spine is normal in alignment on both coronal and sagittal  reconstructions. The relationship of the dens to the ring of C1 is normal  and the facet joints are well aligned bilaterally. No acute fracture is  identified. No prevertebral soft tissue swelling is evident. The central  canal contents are unremarkable within the limits of this modality.  Intrinsic disc degeneration is most notable at C5-6, C6-7 and C7-T1 with  endplate irregularities and ventral disc osteophyte formation. There is no  significant bony foraminal encroachment. Facet arthrosis is most notable at  C2-C3 on the left with hypertrophic spurring.     IMPRESSION:         No acute cervical spine fracture or traumatic malalignment. Cervical  spondylosis and facet arthrosis. No significant change in comparison to the  MR of 04/29/2021, given for differences in modality.     Electronically signed by: Waynard Edwards M.D.  [facility]    Objective     Posture     Head  Forward.    Shoulders  Rounded.    Thoracic Spine  Hyperkyphosis.    Integumentary   no wound, lesion or rash noted    Active Range of Motion (degrees)    Cervical/Thoracic Spine   Cervical    Flexion: 45 degrees   Extension: 30 degrees   Left lateral flexion: 30 degrees   Right lateral flexion: 20 degrees   Left rotation: 60 degrees   Right rotation: 50 degrees     Left Shoulder   Flexion: 155 degrees   Abduction: 175 degrees     Right Shoulder   Flexion: 140 degrees   Abduction: 160 degrees     Strength/Myotome Testing (/5)     Left Shoulder     Planes of Motion   Flexion: 5   Abduction:  5   External rotation at 0 (C5): 5   Internal rotation at 0: 5     Right Shoulder     Planes of Motion   Flexion: 5   Abduction: 5   External rotation at 0 (C5): 5   Internal rotation at 0: 5     Left Elbow   Flexion: 5  Extension: 5    Right Elbow   Flexion: 5  Extension: 5    Left Wrist/Hand     Wrist extension: 5  Wrist flexion: 5    Right Wrist/Hand     Wrist extension: 5  Wrist flexion: 5    Palpation TTP to R suboccipitals, R cervical paraspinals, R SCM, and R UT     Joint Mobility     Comments  Cervical joint mobility comments: Hypomobile in thoracic spine T1-T12.        Tests   Cervical   Deep neck flexor endurance: 24 seconds    Left (Cervical)   Negative cervical distraction.     Right (Cervical)   Negative cervical distraction.     Neurological Testing     Sensation   Cervical/Thoracic   Left   Intact: light touch    Right   Intact: light touch           BP: 113/85 Heart Rate: (!) 101    Treatment     Therapeutic Exercises - Justified to address any of the following:  To develop strength, endurance, ROM and/or flexibility.     Provided pt with HEP and reviewed with pt.    Manual Therapy - Justified to address any of the following:    Mobilization of joints and soft tissues, manipulation, manual lymphatic drainage, and/or manual traction.      Grade V mobilization of T1-T12 in prone    STM to R UT, R SCM, R cervical paraspinals, and R UT in supine    Home Exercises   Access Code: [email or web address]  URL: [email or web address]  Date: 01/18/2022  Prepared by: Lorenza Cambridge    Exercises  Supine Chin Tuck - 1 x daily - 7 x weekly - 2 sets - 10 reps  Seated Passive Cervical Retraction - 1 x daily - 7 x weekly - 2 sets - 10 reps  Seated Upper Trapezius Stretch - 1 x daily - 7 x weekly - 1 sets - 3 reps - 30 hold  Seated Levator Scapulae Stretch - 1 x daily - 7 x weekly - 1 sets - 3 reps - 30 hold  Seated Shoulder Shrugs - 1 x daily - 7 x weekly - 3 sets - 10 reps  Seated Scapular Retraction - 1 x daily - 7 x weekly - 2 sets - 10 reps       ---      Flowsheet Row ---   Total Time    Timed Minutes 20 minutes   Untimed Minutes 25 minutes   Total Time 45 minutes          Assessment   Tanashia is a 41 y.o. female with Beh-cets syndrome and scoliosis presenting with decreased cervical ROM, cervical neck pain, and headaches/migraines with signs pointing to possible cervicogenic neck pain causing headaches who requires skilled Physical Therapy services.    Clinical presentation: evolving - Pt has confounding Beh-cets disorder that also causes migraines among other symptoms  Barriers to therapy: Comorbidities - Beh-cets syndrome and scoliosis       Impairments: Pain that limits and interferes  with functional ability  Impaired postural alignment  Decreased range of motion  Decreased strength  Decreased functional stability  Decreased joint mobility  Decreased soft tissue mobility  Decreased/impaired motor control  Functional Limitations (PLOF): Difficulty/Symptomatic with sitting for prolonged periods of time for more than 2-3 hours for desk work (able to perform prior without difficulty/symptom), Difficulty/Symptomatic with lifting 10lbs or more for household duties such as laundry (able to perform prior without difficulty/symptom), Difficulty/Symptomatic with raising R UE for OH tasks such as reaching for cabinet (able to perform prior without difficulty/symptom),        Prognosis: good  Patient is aware of diagnosis, prognosis and consents to plan of care: Yes  Plan   Visits per week:  2  Number of Sessions: 16  Direct One on One  16109: Therapeutic Exercise: To Develop Strength and Endurance, ROM and Flexibility  [phone]: Neuromuscular Reeducation (Proprioceptive Neuromuscular Faciliation)  [phone]: Self Care/Home Mgmt Training (ADLs, safety procedures, use of assistive devices)  97140: Manual Therapy techniques (mobilization, manipulation, manual traction) (Grade I-V to cervical spine, thoracic spine, shoulder girdle, and regionally interdependent joints, soft tissue mobilization, instrument assisted soft tissue mobilization.)  97530: Therapeutic Activities: Dynamic activities to improve functional performance  Dry Needling  Supervised Modalities  97010: Thermal modalities: hot/cold packs  [id number]: Mechnical traction  97014: Electrical stimulation  Plan for Next Session -: review HEP and begin exercises to strengthen scapular and cervical DNF muscles as well as improve thoracic mobility. Take measurements of B scapular strength.     Goals      Goal 1: Patient will demonstrate independence in prescribed HEP with proper form, sets and reps for safe discharge to an independent program.     Sessions: 16      Goal 2: Patient will demonstrate improvement in FOTO score from initial score of 56  to score of 67  to demonstrate functional improvement during course of care     Sessions: 16      Goal 3: Patient will demonstrate supine chin tuck and lift for 30 seconds to demonstrate deep neck flexor strength to allow patient to sit for desk work for 3 hours without pain and without compensation    Sessions: 16      Goal 4: Patient will demonstrate R shoulder flexion AROM of greater than or equal to 155 degrees to allow patient to lift objects into overhead cabinets without pain and without compensation     Sessions: 16                                  Lorenza Cambridge, DPT

## 2022-01-20 ENCOUNTER — Inpatient Hospital Stay: Payer: BLUE CROSS/BLUE SHIELD | Attending: Neurological Surgery | Admitting: Physical Therapist

## 2022-01-20 DIAGNOSIS — G4486 Cervicogenic headache: Secondary | ICD-10-CM | POA: Insufficient documentation

## 2022-01-20 NOTE — PT/OT Therapy Note (Signed)
Name: Lindsay Straka Age: 41 y.o.   Date of Service: 01/20/2022  Referring Physician: Elder Cyphers, MD   Date of Injury: 12/15/2021  Date Care Plan Established/Reviewed: 01/18/2022  Date Treatment Started: 01/18/2022  Date Care Plan Established/Reviewed No data was found  Date Treatment Started No data was found  (Historic) Date Care Plan Established/Reviewed No data was found  (Historic) Date Treatment Started No data was found   End of Certification Date: 04/17/2022  Sessions in Plan of Care: 16  Surgery Date: No data was found  MD Follow-up: No data was found  Medbridge Code: No data was found    Visit Count: 2   Diagnosis:   1. Cervicogenic headache        Subjective     Daily Subjective   Pt reports that she has participated her HEP with deferment of the trapezius stretch due to exacerbation of neck pain with some headache symptoms. Otherwise, her symptoms have been stable     Social Support/Occupation    Lives in: multiple level home    Lives with: spouse    Occupation: Pensions consultant (desk)      Precautions: No data was found  Allergies: Fruit blend flavor [flavoring agent], Adhesive [wound dressing adhesive], Latex, Oxycodone, and Other    Objective     Strength/Myotome Testing (/5)     Left Shoulder     Isolated Muscles   Lower trapezius: 3+   Middle trapezius: 4   Rhomboids: 4     Right Shoulder     Isolated Muscles   Lower trapezius: 3+   Middle trapezius: 4   Rhomboids: 4                     Treatment     Therapeutic Exercises - Justified to address any of the following:  To develop strength, endurance, ROM and/or flexibility.     Subjective taken for planning of session     Verbal/tactile cues provided to promote correct form including ROM and exercise positioning as well as to promote safe activity for the following exercises.    Manual stretch of L UT in supine    Contract-relax x5 followed by manual stretch of R UT in supine    Supine cervical lateral flexion AROM 2x10 on each side      Contract-relax with R cervical rotation followed by cervical rotation SNAG to R 1x20 in supine    Seated thoracic open books at wall 1x15 on each side     Seated shoulder Rolls 1x20  -forward x1 set  -backwards x1 set           Neuromuscular Re-Education - Justified to address any of the following:   Re-education of movement, balance, coordination, kinesthetic sense, posture and/or proprioception for sitting and/or standing activities.     Verbal/tactile cues provided to promote correct form including ROM, exercise position, and to facilitate activation/engagement of desired musculature.    Supine chintucks 2x10    Prone scapular retraction 1x15  -cueing to promote scapular retraction without UT activation    Prone B shoulder extensions 1x10  -cueing to promote scapular retraction hold without UT activation        Manual Therapy - Justified to address any of the following:    Mobilization of joints and soft tissues, manipulation, manual lymphatic drainage, and/or manual traction.      Extensive STM to R suboccipitals, B cervical paraspinals, B SCM, and B UT in supine  Manual cervical traction in supine        Home Exercises   Access Code: [email or web address]  URL: [email or web address]  Date: 01/18/2022  Prepared by: Lorenza Cambridge     Exercises  Supine Chin Tuck - 1 x daily - 7 x weekly - 2 sets - 10 reps  Seated Passive Cervical Retraction - 1 x daily - 7 x weekly - 2 sets - 10 reps  Seated Upper Trapezius Stretch - 1 x daily - 7 x weekly - 1 sets - 3 reps - 30 hold  Seated Levator Scapulae Stretch - 1 x daily - 7 x weekly - 1 sets - 3 reps - 30 hold  Seated Shoulder Shrugs - 1 x daily - 7 x weekly - 3 sets - 10 reps  Seated Scapular Retraction - 1 x daily - 7 x weekly - 2 sets - 10 reps       ---      Flowsheet Row ---   Total Time    Timed Minutes 45 minutes   Total Time 45 minutes          Assessment   Today is pt's first session following initial evaluation with pt reporting her headache symptoms have not  changed, but there has been decreased tightness and pain of her neck musculature after participating in her HEP. Began session with manual intervention of neck and upper quarter musculature with more focus on R side. After STM, pt reported positive response with decreased tightness/tension. Due to pt report of decreased cervical ROM when stretching, performed contract relax stretching of cervical neck musculature with pt reporting decreased tightness. Followed contract-relax with cervical ROM exercises to reinforce newly promoted motion. Will update HEP with SNAGs, scapular strengthening and thoracic mobility if pt responds positively  and review contract-relax technique if appropriate. Pt tolerated overall session well. Pt to continue to benefit from PT to improve thoracic mobility and cervical ROM,  strengthen cervical and scapular muscles, and reinforce patient education as needed in order to return to PLOF.    Plan   Continue with POC to improve thoracic mobility and cervical ROM, strengthen scapular and cervical musculature, and provide education on sitting posture for desk work. Update HEP with SNAGs, give instruction for contract-relax technique, and self-STM method.      Goals      Goal 1: Patient will demonstrate independence in prescribed HEP with proper form, sets and reps for safe discharge to an independent program.     Sessions: 16     Goal 2: Patient will demonstrate improvement in FOTO score from initial score of 56  to score of 67  to demonstrate functional improvement during course of care     Sessions: 16     Goal 3: Patient will demonstrate supine chin tuck and lift for 30 seconds to demonstrate deep neck flexor strength to allow patient to sit for desk work for 3 hours without pain and without compensation    Sessions: 16     Goal 4: Patient will demonstrate R shoulder flexion AROM of greater than or equal to 155 degrees to allow patient to lift objects into overhead cabinets without pain and  without compensation     Sessions: 16  Oswaldo Milian, DPT

## 2022-01-24 ENCOUNTER — Encounter (INDEPENDENT_AMBULATORY_CARE_PROVIDER_SITE_OTHER): Payer: Self-pay | Admitting: Internal Medicine

## 2022-01-24 ENCOUNTER — Telehealth (INDEPENDENT_AMBULATORY_CARE_PROVIDER_SITE_OTHER): Payer: BLUE CROSS/BLUE SHIELD | Admitting: Internal Medicine

## 2022-01-24 ENCOUNTER — Inpatient Hospital Stay: Payer: BLUE CROSS/BLUE SHIELD | Admitting: Physical Therapist

## 2022-01-24 DIAGNOSIS — J069 Acute upper respiratory infection, unspecified: Secondary | ICD-10-CM

## 2022-01-24 DIAGNOSIS — M0600 Rheumatoid arthritis without rheumatoid factor, unspecified site: Secondary | ICD-10-CM

## 2022-01-24 DIAGNOSIS — M352 Behcet's disease: Secondary | ICD-10-CM

## 2022-01-24 DIAGNOSIS — Z79899 Other long term (current) drug therapy: Secondary | ICD-10-CM

## 2022-01-24 NOTE — Progress Notes (Signed)
RHEUMATOLOGY FOLLOW UP TELEMEDICINE NOTE  Telemedicine Documentation Requirements  Originating site (Patient location): home  Distant site (Provider location): Office  Provider and Title: Dr. Adin Hector  Type of Visit: Video Visit  Verbal Consent has been obtained to conduct a telemedicine visit on this date in order to minimize exposure to COVID-19.          PCP: Virgilio Frees, MD    HPI:    This patient is a 41 y.o. year female with Past Medical History of Prior Cholecystectomy, Wold-Parkinson White, Prior Hysterectomy who returns for follow up of Behcet's HLAB51+, recurrent oral ulcers, pathergy, low grade fevers and seronegative arthritis.      Interval History:    She has low grade fever, sore throat after having company over.    Home covid test was negative.    Her next Humira dose is due this Thursday.    She says she was feeling well alst week before she got sick.    She has still had a recurrent issue with Her R hand middle knuckle.  Also issue with swelling in her R wrist.    She saw her PCP-Dr. Wilmon Arms.  She is on buspirone PRN for panic attacks    She is now taking vitamin D separately daily.    Her migraine medicine was switched and she is having some stomach side effects.    She has an appointment to see the cardiologist-EP    She saw a neurosurgeon for opinion on migraines-they referred her to PT.      Dermatologist started her on dapsone-she has had a few scars that are healing-had injections from her dermatologist.    Derm: Dr. Shellee Milo      Labs Reviewed:  01/14/22  CMP wnl  CRP wnl  ViT D 31  ESR wnl  Lipid panel wnl  TSH wnl      08/18/21  CBC wnl  CMP wnl  ESR wnl  CRP wnl    Previous Labs:  05/25/21  Quant Gold-  HepbSAb wnl  HepbSAg-  HepCAb-  HIV-        Previous Labs:  02/23/21  ESR wnl  CRP wnl  CMP ALT 42, AST normal    CRP wnl  ESR wnl  HLAB51 positive  IgE, IgA, IGG, IgM all wnl  CBC wnl    12/03/20  DIAGNOSIS:          A. Lesion, ulnar wrist, excision:     -Fibroadipose tissue and  vasculature          B. Synovial tissue, biopsy:     -Fibroadipose tissue and synovium (see comment)          Comment:     Morphologic features typical of rheumatoid arthritis or infection are     not identified.    12/03/20  Bacterial Cultures: No Growth  Fungal Cultures: NG at one week  AFB: No growth    11/20/20  ESR 50  CRP wnl  IgG subclasses wnl  ANA comprehensive panel negative        Previous Labs:    Recent [facility] Reviewed:  10/23/20  C-ANCA, P-ANCA, MPO, PR3 all negative  Mitochondrial Ab-  ANA IFA-  Scl70-  Centromere-  RNP-  SMith-  TPPA Ab-      05/18/20  Ferritin 291  CMP AST/A:T 59/70  CBC elevated lymphocytes    02/06/20  MPO-  PR3-  SSA-  aCL IgM 14  aCL IgG, IgA -  Lupus anticoagulant -  B2IgG, IgM -  ACE wnl  CCP-  RF-  Vit D 31  TSH wnl  ESR wnl  CK wnl  C4 wnl  C3 wnl  CRP wnl    07/2019  ANA Screen-  LDH wnl    07/23/2018 lymph node biopsy:DIAGNOSIS:          Lymph node, deep right neck, excisional biopsy:     -Lymph node with non-specific reactive changes   Wound, AFB and Fungal cultures negative at this time    BONE MARROW 05/03/2019      BONE MARROW, LEFT POSTERIOR ILIAC CREST, CORE BIOPSY, CLOT SECTION,    ASPIRATE SMEAR AND TOUCH PREP:        - NORMOCELLULAR BONE MARROW (approximate50-60%) WITH TRILINEAGE HEMATOPOIESIS,  NORMAL MYELOID TO ERYTHROID RATIO AND ADEQUATE NUMBERS OF    MEGAKARYOCYTES WIT H UNREMARKABLE MORPHOLOGY        - A FEW SMALL REACTIVE LYMPHOID AGGREGATES IDENTIFIED, AND MILDLY  INCREASED NUMBERS OF EOSINOPHILS        - PENDING CYTOGENETIC STUDIES OF BONE MARROW SAMPLE  - Remain pending as of 05/14/2019.        COMMENT:    The concurrent flow cytometry performed on the bone marrow aspirate  sample shows no evidence of immunophenotypic abnormalities      Imaging Reviewed:    08/16/20  MRI Brain:IMPRESSION:      1. No intracranial mass, hemorrhage, or hydrocephalus is detected.  2. There is normal pattern of enhancement of the brain parenchyma and  meningeal  surfaces.    9/18 MRI Abdomen:  IMPRESSION:      1. Diffuse hepatic steatosis. No suspicious focal hepatic lesions.  2. No biliary ductal dilatation or choledocholithiasis.    02/03/20  MRI Pelvis:  IMPRESSION:         Cystic structure at the vaginal cuff, flank by both ovaries not  significantly changed in size compared to 2019. No abnormal internal  enhancement. This favors a benign process such as a peritoneal inclusion  cyst or a paraovarian cyst.    12/14/19 MRI R Hand:  IMPRESSION:      1.  No suspicious soft tissue mass, osseous mass, or masslike  enhancement in the right hand or wrist to correlate with the diffuse FDG  uptake on recent PET/CT.  2.  There is mild enhancing synovitis in the right wrist joint without  active joint erosion or bony destruction.  3.  A small dorsal ganglion cyst overlying the scapholunate interval.    12/06/19  Full Body PET Scan:    Hypermetabolic soft tissue mass in palmar R hand surface at base of the second digit-stable pulmonary nodulke 1.3 cm left lower lobe-recommend follow up Chest CT 3 months         The following sections were reviewed this encounter by the provider:   Tobacco  Allergies  Meds  Problems  Med Hx  Surg Hx  Fam Hx         PMH/PSH:  Past Medical History:   Diagnosis Date    Abnormal Pap smear of cervix     Anemia ~12    Pre-hysterectomy    Behcet's disease     BMI 45.0-49.9, adult     Breast lump     Diarrhea     occasional    Fever     for past ~ 3 months (avg daily fever 100-101)    Gastroesophageal reflux disease  Headache     Herpes simplex virus (HSV) infection     Lower back pain     Meningitis     Neck pain     Post-operative nausea and vomiting     Scoliosis     Swollen lymph nodes         Past Surgical History:   Procedure Laterality Date    ablasion  2005    ARTHROTOMY, WRIST Right 12/03/2020    Procedure: RIGHT UPPER EXTREMITY SYNOVIAL BIOPSY;  Surgeon: Kellie Simmering, MD;  Location: [facility];  Service: Plastics;  Laterality: Right;     BIOPSY, LYMPH NODE N/A 07/23/2018    Procedure: BIOPSY, LYMPH NODE;  Surgeon: Shelda Pal, MD;  Location: [facility] [facility];  Service: ENT;  Laterality: N/A;  NECK DEEP LYMPH NODE BIOPSY    BONE MARROW BIOPSY  2020    CHOLECYSTECTOMY  2009    D&C DIAGNOSTIC  2010    DILATION AND CURETTAGE OF UTERUS  2010    FINGER GANGLION CYST EXCISION  04/2018    GANGLION CYST EXCISION  2014    04/2018 cyst removed from index finger-[facility]    HERNIA REPAIR  2016    HYSTERECTOMY  2012    PELVIC LAPAROSCOPY  2011    UMBILICAL HERNIA REPAIR  2016    UPPER GASTROINTESTINAL ENDOSCOPY  2009    WISDOM TOOTH EXTRACTION  1998        FH/SH:  Family History   Adopted: Yes       Social History     Tobacco Use    Smoking status: Never    Smokeless tobacco: Never   Vaping Use    Vaping status: Never Used   Substance Use Topics    Alcohol use: Not Currently     Alcohol/week: 0.0 standard drinks of alcohol     Comment: rarely    Drug use: Never        Meds/ Allergies:  No outpatient medications have been marked as taking for the 01/24/22 encounter (Telemedicine Visit) with Baltazar Najjar, MD.     Allergies   Allergen Reactions    Fruit Blend Flavor [Flavoring Agent] Anaphylaxis and Swelling    Adhesive [Wound Dressing Adhesive]      dermabond    Latex Other (See Comments), Hives and Swelling     Swelling and redness throughout body   Also severe allergy to surgical glue    Oxycodone Other (See Comments)     Hallucinations    Other Itching and Rash     Tropical fruits like coconuts, pineapple, kiwi, etc.          PHYSICAL EXAM  General- WNWD, alert and oriented, NAD  Eyes- EOMI, PERRL, no conjunctival injection, no scleral icterus  ENMT- face symmetric without lesions  Skin- no rash, no alopecia  Pulm: No conversational dyspnea, breathing comfortable on room air      .        LABS:  Lab Results   Component Value Date    WBC 9.1 01/14/2022    HGB 15.3 01/14/2022    HCT 44.4 01/14/2022    MCV 90 01/14/2022    PLT 370  01/14/2022      Lab Results   Component Value Date    CREAT 0.82 01/14/2022    BUN 13 01/14/2022    NA 141 01/14/2022    K 5.1 01/14/2022    CL 105 01/14/2022    CO2 18 (L)  01/14/2022      Lab Results   Component Value Date    ALT 28 01/14/2022    AST 22 01/14/2022    ALKPHOS 62 01/14/2022    BILITOTAL 0.4 01/14/2022     Lab Results   Component Value Date    ESR 15 01/14/2022    ESR 6 02/23/2021    ESR 7 01/25/2021    ESR 50 (H) 11/20/2020    ESR 11 02/06/2020      Lab Results   Component Value Date    CRP 1 01/14/2022    CRP <1 02/23/2021    CRP 2 01/25/2021    CRP 3 11/20/2020    CRP 0.2 02/06/2020      No results found for: ANA   Lab Results   Component Value Date    RHEUMFACTOR <15.0 02/06/2020      Lab Results   Component Value Date    CCP <15.6 02/06/2020     No results found for: URICACID        ASSESSMENT/PLAN:    This patient is a 41 y.o. year female with Past Medical History of Prior Cholecystectomy, Wolf-Parkinson White, Prior Hysterectomy, Fatty Liver Disease who returns for follow up of Behcet's Disease and seronegative rheumatoid arthritis.    She will hold [place] and Humira for now given sore throat and low grade fever-covid negative so far.  She will go to urgent care tomorrow for testing of Strep, flu and COVID (very end of lu season!)  If she becomes COVID positive she will notify us and will provide paxlovid.    Following with Neurology for migraines.    Cardiology evaluation for bradycardia.    Recent labs normal.    1. Behcet's disease    2. Seronegative rheumatoid arthritis    3. High risk medication use    4. Viral URI             Total Time spent reviewing records, interviewing patient and coordinating plan of care: 33 min    FOLLOW UP:  Return in about 6 weeks (around 03/07/2022) for Follow Up, 6-8 weeks video OK.                                                                                  Debe Coder, MD  Rheumatologist  [facility]  [address]  [place], [place]  [address]  Phone: [phone]  Fax: [phone]

## 2022-01-24 NOTE — Patient Instructions (Signed)
Dear Hailey [name] ,     Thanks for arranging a video visit with me.    Here are things I'd like you to do following today's visit:  Medication instructions:  -Please hold your Humira dose this Thursday  -Please hold your Tiajuana Amass may retrieve any orders from this visit in Mychart by using these steps:    After signing into Mychart  1) click Messaging  2) click Letters  3) select the Requisition  4) Review and click print    Please contact the clinic at [phone] with any questions and to schedule your follow up visit.    Sincerely,    Debe Coder, MD  Rheumatologist  [facility]  [address]  [place], [place] [address]  Phone: [phone]  Fax: [phone]

## 2022-01-25 ENCOUNTER — Ambulatory Visit (INDEPENDENT_AMBULATORY_CARE_PROVIDER_SITE_OTHER): Payer: BLUE CROSS/BLUE SHIELD | Admitting: Family

## 2022-01-25 ENCOUNTER — Encounter (INDEPENDENT_AMBULATORY_CARE_PROVIDER_SITE_OTHER): Payer: Self-pay | Admitting: Internal Medicine

## 2022-01-25 ENCOUNTER — Encounter (INDEPENDENT_AMBULATORY_CARE_PROVIDER_SITE_OTHER): Payer: Self-pay

## 2022-01-25 VITALS — BP 150/83 | HR 75 | Temp 99.5°F | Resp 20 | Ht 68.0 in | Wt 315.0 lb

## 2022-01-25 DIAGNOSIS — J020 Streptococcal pharyngitis: Secondary | ICD-10-CM

## 2022-01-25 LAB — POCT RAPID STREP A: Rapid Strep A Screen POCT: POSITIVE — AB

## 2022-01-25 LAB — POCT INFLUENZA A/B
POCT Rapid Influenza A AG: NEGATIVE
POCT Rapid Influenza B AG: NEGATIVE

## 2022-01-25 LAB — IN OFFICE COVID POCT ABBOTT ID NOW: SARS CoV 2 Overall Result: NEGATIVE

## 2022-01-25 MED ORDER — AMOXICILLIN 875 MG PO TABS
875.0000 mg | ORAL_TABLET | Freq: Two times a day (BID) | ORAL | 0 refills | Status: AC
Start: 2022-01-25 — End: 2022-02-04

## 2022-01-25 NOTE — Progress Notes (Signed)
[facility]  PROGRESS NOTE     Patient: Hailey [name]   Date: 01/25/2022   MRN: [id number]       Scout Guyett is a 41 y.o. female      HISTORY     History obtained from: Patient    Chief Complaint   Patient presents with    URI     Onset 3/19, c/o sore throat, pain when swallowing, headache, sneezing, low grade fever. Wants to get tested for Covid, Strep & Flu. No meds taken today for sx. Covid x5 vaxxed.           History provided by:  Patient  Language interpreter used: No    URI   This is a new problem. Episode onset: 3 days. The problem has been unchanged. There has been no fever. Associated symptoms include congestion, headaches, rhinorrhea and a sore throat. Pertinent negatives include no abdominal pain, chest pain, coughing, diarrhea, ear pain, nausea, neck pain, rash, vomiting or wheezing. She has tried nothing for the symptoms. The treatment provided no relief.        Review of Systems   Constitutional:  Negative for appetite change, chills, diaphoresis and fever.   HENT:  Positive for congestion, postnasal drip, rhinorrhea and sore throat. Negative for ear pain, hearing loss, sinus pressure and trouble swallowing.    Eyes:  Negative for discharge.   Respiratory:  Negative for cough, chest tightness, shortness of breath and wheezing.    Cardiovascular:  Negative for chest pain.   Gastrointestinal:  Negative for abdominal pain, diarrhea, nausea and vomiting.   Musculoskeletal:  Negative for arthralgias, myalgias, neck pain and neck stiffness.   Skin:  Negative for rash.   Allergic/Immunologic: Negative for environmental allergies.   Neurological:  Positive for headaches. Negative for dizziness.   Hematological:  Negative for adenopathy.   Psychiatric/Behavioral:  Negative for confusion and sleep disturbance.        History:    Pertinent Past Medical, Surgical, Family and Social History were reviewed.        Current Outpatient Medications:     Adalimumab 40 MG/0.4ML Pen-injector Kit,  Inject 40 mg into the skin every 10 (ten) days, Disp: 6 each, Rfl: 3    Azelastine-Fluticasone 137-50 MCG/ACT Suspension, 2 (two) times daily as needed, Disp: , Rfl:     busPIRone (BUSPAR) 5 MG tablet, TAKE 1 TABLET (5 MG) BY MOUTH 3 (THREE) TIMES DAILY AS NEEDED (ANXIETY), Disp: 270 tablet, Rfl: 0    clindamycin (CLEOCIN T) 1 % lotion, APPLY TWICE A DAY TO BREAKOUTS ON FACE, WASH WITH OVER THE COUNTER BENZOYL PEROXIDE BEFORE HAND, Disp: , Rfl:     colesevelam (WELCHOL) 625 MG tablet, Take 1 tablet (625 mg) by mouth 2 (two) times daily with meals, Disp: 180 tablet, Rfl: 3    Dapsone 7.5 % Gel, APPLY DAILY TO ACNE/FOLLICULITIS ON FACE, Disp: , Rfl:     fluocinonide (LIDEX) 0.05 % gel, APPLY TO AFFECTED SORES ON SKIN OR R MOUTH TWICE DAILY UNTIL RESOLVED., Disp: , Rfl:     hydrOXYzine (ATARAX) 10 MG tablet, Take 1 tablet (10 mg) by mouth nightly, Disp: 90 tablet, Rfl: 3    lidocaine-prilocaine (EMLA) cream, , Disp: , Rfl:     Multiple Vitamins-Minerals (MULTIVITAMIN ADULT PO), Take by mouth daily  , Disp: , Rfl:     ondansetron (ZOFRAN-ODT) 4 MG disintegrating tablet, Take 1 tablet (4 mg) by mouth every 8 (eight) hours as needed for  Nausea, Disp: 30 tablet, Rfl: 0    Otezla 30 MG Tab, TAKE 1 TABLET BY MOUTH 2 TIMES A DAY., Disp: 60 tablet, Rfl: 2    Rimegepant Sulfate 75 MG Tablet Dispersible, Take 1 tablet (75 mg) by mouth daily as needed (headache), Disp: 30 tablet, Rfl: 11    rizatriptan (MAXALT) 10 MG tablet, Take 1-2 tablets (10-20 mg total) by mouth once as needed for Migraine Max 3 days a week, Disp: 36 tablet, Rfl: 3    valACYclovir (VALTREX) 1000 MG tablet, Take 2 tabs at onset; repeat once in 12 hours.  Total 4 tabs per outbreak, Disp: 12 tablet, Rfl: 3    amoxicillin (AMOXIL) 875 MG tablet, Take 1 tablet (875 mg) by mouth 2 (two) times daily for 10 days, Disp: 20 tablet, Rfl: 0    EPINEPHrine (EPIPEN 2-PAK) 0.3 MG/0.3ML Solution Auto-injector injection, Inject 0.3 mLs (0.3 mg) into the muscle once as needed  (Anaphylaxis) ; go to ER after injection (Patient not taking: Reported on 01/14/2022), Disp: 1 each, Rfl: 1    Allergies   Allergen Reactions    Fruit Blend Flavor [Flavoring Agent] Anaphylaxis and Swelling    Adhesive [Wound Dressing Adhesive]      dermabond    Latex Other (See Comments), Hives and Swelling     Swelling and redness throughout body   Also severe allergy to surgical glue    Oxycodone Other (See Comments)     Hallucinations    Other Itching and Rash     Tropical fruits like coconuts, pineapple, kiwi, etc.       Medications and Allergies reviewed.    PHYSICAL EXAM     Vitals:    01/25/22 1655   BP: 150/83   Pulse: 75   Resp: 20   Temp: 99.5 F (37.5 C)   SpO2: 98%   Weight: 142.9 kg (315 lb)   Height: 1.727 m (5\' 8" )       Physical Exam  Constitutional:       General: She is not in acute distress.     Appearance: She is well-developed.   HENT:      Head: Normocephalic and atraumatic.      Right Ear: Hearing, tympanic membrane, ear canal and external ear normal.      Left Ear: Hearing, tympanic membrane, ear canal and external ear normal.      Nose: Mucosal edema and congestion present.      Mouth/Throat:      Pharynx: No oropharyngeal exudate.     Eyes: Conjunctivae are normal. Right conjunctiva is not injected. Left conjunctiva is not injected. No scleral icterus. Cardiovascular:      Rate and Rhythm: Normal rate and regular rhythm.      Heart sounds: Normal heart sounds. No murmur heard.  Pulmonary:      Effort: Pulmonary effort is normal. No respiratory distress.      Breath sounds: Normal breath sounds. No wheezing.   Musculoskeletal:      Cervical back: Normal range of motion and neck supple.   Lymphadenopathy:      Cervical: No cervical adenopathy.   Neurological:      Mental Status: She is alert and oriented to person, place, and time.   Skin:     General: Skin is warm and dry.      Findings: No rash.   Vitals and nursing note reviewed.         UCC COURSE     LABS  The following POCT tests were  ordered, reviewed and discussed with the patient/family.     Results       Procedure Component Value Units Date/Time    Rapid Group A Strep [[id number]]  (Abnormal) Collected: 01/25/22 1720    Specimen: Throat Updated: 01/25/22 1720     POCT QC Pass     Rapid Strep A Screen POCT Positive     Comment Negative Results should be confirmed by throat Cx to confirm absence of Strep A inf.    Influenza A/B [[id number]]  (Normal) Collected: 01/25/22 1719     Updated: 01/25/22 1719     POCT QC Pass     POCT Rapid Influenza A AG Negative     POCT Rapid Influenza B AG Negative    [facility] ID Now SARS-COV-2 POCT [[id number]]  (Normal) Collected: 01/25/22 1719    Specimen: Nasal Swab COVID-19 Updated: 01/25/22 1719     SARS CoV 2 Overall Result Negative            There were no x-rays reviewed with this patient during the visit.    No current facility-administered medications for this visit.       PROCEDURES     Procedures    MEDICAL DECISION MAKING     History, physical, labs/studies most consistent with pharyngitis  as the diagnosis.    Chart Review:  Prior PCP, Specialist and/or ED notes reviewed today: Yes  Prior labs/images/studies reviewed today: Yes    Differential Diagnosis: pharyngitis, uri, covid, flu      ASSESSMENT     Encounter Diagnosis   Name Primary?    Pharyngitis due to Streptococcus species Yes            PLAN      PLAN:     VSS  RST +  Amoxicillin rx  Supportive care discussed with pt.       Orders Placed This Encounter   Procedures    Abbott ID Now SARS-COV-2 POCT    Influenza A/B    Rapid Group A Strep     Requested Prescriptions     Signed Prescriptions Disp Refills    amoxicillin (AMOXIL) 875 MG tablet 20 tablet 0     Sig: Take 1 tablet (875 mg) by mouth 2 (two) times daily for 10 days       Discussed results and diagnosis with patient/family.  Reviewed warning signs for worsening condition, as well as, indications for follow-up with primary care physician and return to urgent care clinic.   Patient/family  expressed understanding of instructions.     An After Visit Summary was provided to the patient.

## 2022-01-27 ENCOUNTER — Inpatient Hospital Stay: Payer: BLUE CROSS/BLUE SHIELD | Admitting: Physical Therapist

## 2022-01-31 ENCOUNTER — Ambulatory Visit (INDEPENDENT_AMBULATORY_CARE_PROVIDER_SITE_OTHER): Payer: BLUE CROSS/BLUE SHIELD | Admitting: Obstetrics and Gynecology

## 2022-01-31 ENCOUNTER — Inpatient Hospital Stay: Payer: BLUE CROSS/BLUE SHIELD | Admitting: Physical Therapist

## 2022-02-02 ENCOUNTER — Ambulatory Visit (INDEPENDENT_AMBULATORY_CARE_PROVIDER_SITE_OTHER): Payer: BLUE CROSS/BLUE SHIELD | Admitting: Clinical Cardiac Electrophysiology

## 2022-02-03 ENCOUNTER — Inpatient Hospital Stay: Payer: BLUE CROSS/BLUE SHIELD

## 2022-02-04 ENCOUNTER — Other Ambulatory Visit (INDEPENDENT_AMBULATORY_CARE_PROVIDER_SITE_OTHER): Payer: Self-pay | Admitting: Internal Medicine

## 2022-02-05 NOTE — PT/OT Therapy Note (Signed)
Name: Hailey [name] Age: 41 y.o.   Date of Service: 02/07/2022  Referring Physician: Elder Cyphers, MD   Date of Injury: 12/15/2021  Date Care Plan Established/Reviewed: 01/18/2022  Date Treatment Started: 01/18/2022  Date Care Plan Established/Reviewed No data was found  Date Treatment Started No data was found  (Historic) Date Care Plan Established/Reviewed No data was found  (Historic) Date Treatment Started No data was found   End of Certification Date: 04/17/2022  Sessions in Plan of Care: 16  Surgery Date: No data was found  MD Follow-up: No data was found  Medbridge Code: No data was found    Visit Count: 3   Diagnosis:   1. Cervicogenic headache        Subjective     Daily Subjective   Patient reported she had strep throat and was on antibiotics for two weeks so she has not been doing HEP and she had to stop all her medications so she has not been feeling well but did note that when she was on antibiotics she did not have a migraine.     Social Support/Occupation    Lives in: multiple level home    Lives with: spouse    Occupation: Pensions consultant (desk)      Precautions: No data was found  Allergies: Fruit blend flavor [flavoring agent], Adhesive [wound dressing adhesive], Latex, Oxycodone, and Other    Objective                     Treatment     Therapeutic Exercises - Justified to address any of the following:  To develop strength, endurance, ROM and/or flexibility.   Active warmup on UBE x 5 minutes with subjective captured to plan for today's treatment session    B open books x 10 reps x 5 second hold with verbal and tactile cues for proper form and technique      Educated patient on proper form, technique, reps, and sets for updated HEP     Neuromuscular Re-Education - Justified to address any of the following:   Re-education of movement, balance, coordination, kinesthetic sense, posture and/or proprioception for sitting and/or standing activities.   Chin tucks into towel roll x 20 reps x 5 second hold  with verbal and tactile cues for cervical mm recruitment      B shoulder horizontal ABD RTB with B cervical rotations x 10 reps x 5 second hold with verbal and tactile cues to prevent UT compensation    Seated Ws 2 sets x 10 reps with verbal and tactile cues for scapular mm recruitment     B shoulder extensions GTB 2 sets x 10 reps with verbal and tactile cues to prevent UT compensation    Manual Therapy - Justified to address any of the following:    Mobilization of joints and soft tissues, manipulation, manual lymphatic drainage, and/or manual traction.    STM B cervical paraspinals, SO, UT and LS    Home Exercises   Access Code: [email or web address]  URL: [email or web address]  Date: 02/07/2022  Prepared by: Danie Binder    Exercises  - Sidelying Open Book Thoracic Lumbar Rotation and Extension  - 1 x daily - 7 x weekly - 10 reps - 5 hold  - Supine Chin Tuck  - 1 x daily - 7 x weekly - 2 sets - 10 reps  - Seated Passive Cervical Retraction  - 1 x daily - 7 x weekly - 2 sets - 10  reps  - Seated Upper Trapezius Stretch  - 1 x daily - 7 x weekly - 1 sets - 3 reps - 30 hold  - Seated Levator Scapulae Stretch  - 1 x daily - 7 x weekly - 1 sets - 3 reps - 30 hold  - Seated Shoulder Shrugs  - 1 x daily - 7 x weekly - 3 sets - 10 reps  - Seated Scapular Retraction  - 1 x daily - 7 x weekly - 2 sets - 10 reps       ---      Flowsheet Row ---   Total Time    Timed Minutes 45 minutes   Total Time 45 minutes          Assessment   Patient presented to session reporting she had strep throat and was on antibiotics for two weeks so she has not been doing HEP and she had to stop all her medications so she has not been feeling well. Today's session focused on cervical mobility and cervical and scapular strengthening. Patient tolerated session well and would continue to benefit from skilled PT to address remaining mobility and strength deficits to allow for successful return to PLOF.   Plan   Continue with POC to improve  thoracic mobility and cervical ROM and strengthen scapular and cervical musculature.    *Patient is immunocompromised and may want to be in a room if people are not wearing masks/may want therapist to wear a mask*      Goals      Goal 1: Patient will demonstrate independence in prescribed HEP with proper form, sets and reps for safe discharge to an independent program.     Sessions: 16     Goal 2: Patient will demonstrate improvement in FOTO score from initial score of 56  to score of 67  to demonstrate functional improvement during course of care     Sessions: 16     Goal 3: Patient will demonstrate supine chin tuck and lift for 30 seconds to demonstrate deep neck flexor strength to allow patient to sit for desk work for 3 hours without pain and without compensation    Sessions: 16     Goal 4: Patient will demonstrate R shoulder flexion AROM of greater than or equal to 155 degrees to allow patient to lift objects into overhead cabinets without pain and without compensation     Sessions: 16                                                Danie Binder, DPT

## 2022-02-07 ENCOUNTER — Inpatient Hospital Stay: Payer: BLUE CROSS/BLUE SHIELD | Attending: Neurological Surgery

## 2022-02-07 DIAGNOSIS — G4486 Cervicogenic headache: Secondary | ICD-10-CM

## 2022-02-10 ENCOUNTER — Inpatient Hospital Stay: Payer: BLUE CROSS/BLUE SHIELD | Attending: Neurological Surgery | Admitting: Physical Therapist

## 2022-02-10 DIAGNOSIS — G4486 Cervicogenic headache: Secondary | ICD-10-CM | POA: Insufficient documentation

## 2022-02-10 NOTE — PT/OT Therapy Note (Signed)
Name: Lesta Limbert Age: 41 y.o.   Date of Service: 02/10/2022  Referring Physician: Elder Cyphers, MD   Date of Injury: 12/15/2021  Date Care Plan Established/Reviewed: 01/18/2022  Date Treatment Started: 01/18/2022  Date Care Plan Established/Reviewed No data was found  Date Treatment Started No data was found  (Historic) Date Care Plan Established/Reviewed No data was found  (Historic) Date Treatment Started No data was found   End of Certification Date: 04/17/2022  Sessions in Plan of Care: 16  Surgery Date: No data was found  MD Follow-up: No data was found  Medbridge Code: No data was found    Visit Count: 4   Diagnosis:   1. Cervicogenic headache        Subjective     Daily Subjective   Patient states since last session she has been feeling better with doing her HEP, gentle with UT stretching.  Did have a migraine yesterday lasted for about four hours; usually on left as well as more structural pain on right.    Social Support/Occupation    Lives in: multiple level home    Lives with: spouse    Occupation: Pensions consultant (desk)      Precautions: No data was found  Allergies: Fruit blend flavor [flavoring agent], Adhesive [wound dressing adhesive], Latex, Oxycodone, and Other    Objective                     Treatment     Therapeutic Exercises - Justified to address any of the following:  To develop strength, endurance, ROM and/or flexibility.   Subjective captured to plan for today's treatment session    Stretching of R shoulder all directions     B open books x 10 reps x 5 second hold with verbal and tactile cues for proper form and technique      Neuromuscular Re-Education - Justified to address any of the following:   Re-education of movement, balance, coordination, kinesthetic sense, posture and/or proprioception for sitting and/or standing activities.   Chin tuck with head lift 2x10 cuing for DNF    B shoulder horizontal ABD RTB with B cervical rotations x 10 reps x 5 second hold with verbal and tactile  cues to prevent UT compensation    Seated ER and scapular retraction vs RTB 2x10 cuing for scapular setting    Row vs RTB 2x10 cuing to decrease trunk extension     Manual Therapy - Justified to address any of the following:    Mobilization of joints and soft tissues, manipulation, manual lymphatic drainage, and/or manual traction.    STM R cervical paraspinals, SO, UT and LS    Home Exercises   Access Code: [email or web address]  URL: [email or web address]  Date: 02/07/2022  Prepared by: Danie Binder    Exercises  - Sidelying Open Book Thoracic Lumbar Rotation and Extension  - 1 x daily - 7 x weekly - 10 reps - 5 hold  - Supine Chin Tuck  - 1 x daily - 7 x weekly - 2 sets - 10 reps  - Seated Passive Cervical Retraction  - 1 x daily - 7 x weekly - 2 sets - 10 reps  - Seated Upper Trapezius Stretch  - 1 x daily - 7 x weekly - 1 sets - 3 reps - 30 hold  - Seated Levator Scapulae Stretch  - 1 x daily - 7 x weekly - 1 sets - 3 reps - 30 hold  - Seated  Shoulder Shrugs  - 1 x daily - 7 x weekly - 3 sets - 10 reps  - Seated Scapular Retraction  - 1 x daily - 7 x weekly - 2 sets - 10 reps       ---      Flowsheet Row ---   Total Time    Timed Minutes 40 minutes   Total Time 40 minutes          Assessment   Patient tolerated therapy session well with emphasis on cervical and postural stability with nil symptoms noted.  Patient required moderate cuing for proper body mechanics throughout POC exercises and progressions.  Patient fatigued and appropriate rest breaks taken.   Plan   Continue with POC to improve thoracic mobility and cervical ROM and strengthen scapular and cervical musculature.  Update HEP appropriately     *Patient is immunocompromised and may want to be in a room if people are not wearing masks/may want therapist to wear a mask*      Goals      Goal 1: Patient will demonstrate independence in prescribed HEP with proper form, sets and reps for safe discharge to an independent program.     Sessions: 16      Goal 2: Patient will demonstrate improvement in FOTO score from initial score of 56  to score of 67  to demonstrate functional improvement during course of care     Sessions: 16     Goal 3: Patient will demonstrate supine chin tuck and lift for 30 seconds to demonstrate deep neck flexor strength to allow patient to sit for desk work for 3 hours without pain and without compensation    Sessions: 16     Goal 4: Patient will demonstrate R shoulder flexion AROM of greater than or equal to 155 degrees to allow patient to lift objects into overhead cabinets without pain and without compensation     Sessions: 16                                                Thressa Sheller, DPT

## 2022-02-14 ENCOUNTER — Inpatient Hospital Stay: Payer: BLUE CROSS/BLUE SHIELD | Attending: Neurological Surgery | Admitting: Physical Therapist

## 2022-02-14 DIAGNOSIS — G4486 Cervicogenic headache: Secondary | ICD-10-CM | POA: Insufficient documentation

## 2022-02-14 NOTE — PT/OT Therapy Note (Signed)
Name: Hailey [name] Age: 41 y.o.   Date of Service: 02/14/2022  Referring Physician: Elder Cyphers, MD   Date of Injury: 12/15/2021  Date Care Plan Established/Reviewed: 01/18/2022  Date Treatment Started: 01/18/2022  Date Care Plan Established/Reviewed No data was found  Date Treatment Started No data was found  (Historic) Date Care Plan Established/Reviewed No data was found  (Historic) Date Treatment Started No data was found   End of Certification Date: 04/17/2022  Sessions in Plan of Care: 16  Surgery Date: No data was found  MD Follow-up: No data was found  Medbridge Code: No data was found    Visit Count: 5   Diagnosis:   1. Cervicogenic headache        Subjective     Daily Subjective   Icing and meds;   Stiffness in neck  Ankles R hip and hands    Social Support/Occupation    Lives in: multiple level home    Lives with: spouse    Occupation: Pensions consultant (desk)      Precautions: No data was found  Allergies: Fruit blend flavor [flavoring agent], Adhesive [wound dressing adhesive], Latex, Oxycodone, and Other    Objective                     Treatment     Therapeutic Exercises - Justified to address any of the following:  To develop strength, endurance, ROM and/or flexibility.   Subjective captured to plan for today's treatment session    Stretching of R shoulder all directions     Shoulder rolls x10 B       Neuromuscular Re-Education - Justified to address any of the following:   Re-education of movement, balance, coordination, kinesthetic sense, posture and/or proprioception for sitting and/or standing activities.   Chin tuck with head lift 2x10 cuing for DNF    B shoulder horizontal ABD RTB with B cervical rotations x10 with verbal and tactile cues to prevent UT compensation    ER walkout vs RTB x10 B cuing for alignment     BTB shoulder extension 2x10 cuing for scapular setting then rows    Manual Therapy - Justified to address any of the following:    Mobilization of joints and soft tissues,  manipulation, manual lymphatic drainage, and/or manual traction.    STM R cervical paraspinals, SO, UT and LS    AA and OA joint mobility grade III     Home Exercises   Access Code: [email or web address]  URL: [email or web address]  Date: 02/07/2022  Prepared by: Danie Binder    Exercises  - Sidelying Open Book Thoracic Lumbar Rotation and Extension  - 1 x daily - 7 x weekly - 10 reps - 5 hold  - Supine Chin Tuck  - 1 x daily - 7 x weekly - 2 sets - 10 reps  - Seated Passive Cervical Retraction  - 1 x daily - 7 x weekly - 2 sets - 10 reps  - Seated Upper Trapezius Stretch  - 1 x daily - 7 x weekly - 1 sets - 3 reps - 30 hold  - Seated Levator Scapulae Stretch  - 1 x daily - 7 x weekly - 1 sets - 3 reps - 30 hold  - Seated Shoulder Shrugs  - 1 x daily - 7 x weekly - 3 sets - 10 reps  - Seated Scapular Retraction  - 1 x daily - 7 x weekly - 2 sets - 10 reps       ---  Flowsheet Row ---   Total Time    Timed Minutes 45 minutes   Total Time 45 minutes          Assessment   Patient presents to therapy session with acute exacerbation of arthritis in B ankles, hands, and R hip that started over the weekend.  Patient denies changes in neck and R shoulder symptoms however notes not having any migraines over the weekend.  Patient demonstrates increased TTP and tissue tension with SOR L > R addressed through manual therapy and OA/AA joint mobility.   Plan   Update HEP; check in with what bands patient has at home  Update objective measures  Continue with POC to improve thoracic mobility and cervical ROM and strengthen scapular and cervical musculature.      *Patient is immunocompromised and may want to be in a room if people are not wearing masks/may want therapist to wear a mask*      Goals      Goal 1: Patient will demonstrate independence in prescribed HEP with proper form, sets and reps for safe discharge to an independent program.     Sessions: 16     Goal 2: Patient will demonstrate improvement in FOTO score from  initial score of 56  to score of 67  to demonstrate functional improvement during course of care     Sessions: 16     Goal 3: Patient will demonstrate supine chin tuck and lift for 30 seconds to demonstrate deep neck flexor strength to allow patient to sit for desk work for 3 hours without pain and without compensation    Sessions: 16     Goal 4: Patient will demonstrate R shoulder flexion AROM of greater than or equal to 155 degrees to allow patient to lift objects into overhead cabinets without pain and without compensation     Sessions: 16                                                Thressa Sheller, DPT

## 2022-02-16 ENCOUNTER — Inpatient Hospital Stay: Payer: BLUE CROSS/BLUE SHIELD | Attending: Neurological Surgery | Admitting: Physical Therapist

## 2022-02-16 DIAGNOSIS — G4486 Cervicogenic headache: Secondary | ICD-10-CM | POA: Insufficient documentation

## 2022-02-16 NOTE — PT/OT Therapy Note (Signed)
Name: Hailey RamaChristine Linda [name] Age: 41 y.o.   Date of Service: 02/16/2022  Referring Physician: Elder CyphersGorsen, Robert M, MD   Date of Injury: 12/15/2021  Date Care Plan Established/Reviewed: 01/18/2022  Date Treatment Started: 01/18/2022  Date Care Plan Established/Reviewed No data was found  Date Treatment Started No data was found  (Historic) Date Care Plan Established/Reviewed No data was found  (Historic) Date Treatment Started No data was found   End of Certification Date: 04/17/2022  Sessions in Plan of Care: 16  Surgery Date: No data was found  MD Follow-up: No data was found  Medbridge Code: No data was found    Visit Count: 6   Diagnosis:   1. Cervicogenic headache        Subjective     Outcome Measure   Tool Used/Details: FOTO 02/16/22 KD  Score: 54  Predicted Functional Outcome: 66    Daily Subjective   Pt reports that she had a rough weekend this past week with a spike in arthritis pain that has contributed to increase in neck tension. Her arthritic pain affects the R UE, low back, R knee, and R hip. In terms of migraines and headaches, she did not have any migraines while she was on anti-biotics. After being off the antiobiotics, she has had 3 migraines over the last week, but noted that this is less than before.     Social Support/Occupation    Lives in: multiple level home    Lives with: spouse    Occupation: Pensions consultantAttorney (desk)      Precautions: No data was found  Allergies: Fruit blend flavor [flavoring agent], Adhesive [wound dressing adhesive], Latex, Oxycodone, and Other    Objective     Active Range of Motion (degrees)     Right Shoulder   Flexion: 145 degrees     Tests   Cervical   Deep neck flexor endurance: 24 seconds                    Treatment     Therapeutic Exercises - Justified to address any of the following:  To develop strength, endurance, ROM and/or flexibility.     Subjective taken for planning of session     Objective measures taken and FOTO score captured to update goals    Verbal/tactile cues  provided to promote correct form including ROM and exercise positioning as well as to promote safe activity for the following exercises.    Supine chintuck 1x10 with 1-2" hold     Seated UT stretch 2x30" on each side     Seated cervical lateral flexion with UE overpressure 1x15 with 1-2" hold on each side     Seated shoulder shrugs forward 1x15, backwards 1x15           Neuromuscular Re-Education - Justified to address any of the following:   Re-education of movement, balance, coordination, kinesthetic sense, posture and/or proprioception for sitting and/or standing activities.     Verbal/tactile cues provided to promote correct form including ROM, exercise position, and to facilitate activation/engagement of desired musculature.    Supine chintuck hold with head lift 1x8 with 10" hold    Seated band pull aparts GTB 2x10     Standing rows blue TB 2x10 with 3" hold       Manual Therapy - Justified to address any of the following:    Mobilization of joints and soft tissues, manipulation, manual lymphatic drainage, and/or manual traction.      Extensive  STM to B suboccipitals, cervical paraspinals, SCM, UT, and LS in supine           ---      Flowsheet Row ---   Total Time    Timed Minutes 45 minutes   Total Time 45 minutes          Assessment   Today's session focused on improving cervical musculature extensibility and cervical/scapular strength to improve postural stability. For FOTO score, pt had slight decrease in score by 3 points which could possibly correlate to pt's recent episode of strep throat and inability to perform HEP exercises. Via objective measures, pt demonstrates maintenance in cervical DNF endurance and slight improvement in R shoulder ROM. Began session by performing extensive manual intervention for pt's cervical and scapular region including contract relax stretching and MFR to decrease tension of upper quarter with pt responding positively. Followed with cervical and scapular strengthening to  reinforce new muscular mobility. Pt tolerated overall session well. Pt to continue to benefit from PT to improve cervical ROM and mobility,  strengthen B upper quarter and DNF strength, and reinforce patient education in order to return to PLOF.     Plan   Continue with POC to address to cervical strength and improve scapular muscular strength in order to improve postural stability. Objective measures taken on: 02/16/22       Goals      Goal 1: Patient will demonstrate independence in prescribed HEP with proper form, sets and reps for safe discharge to an independent program.    02/16/22 KD: Pt was performing HEP consistently prior to being sick (in progress)      Sessions: 16     Goal 2: Patient will demonstrate improvement in FOTO score from initial score of 56  to score of 66  to demonstrate functional improvement during course of care    02/16/22 KD: FOTO score 54/66 (In progress)     Sessions: 16     Goal 3: Patient will demonstrate supine chin tuck and lift for 30 seconds to demonstrate deep neck flexor strength to allow patient to sit for desk work for 3 hours without pain and without compensation     02/16/22 KD: Pt supine chintuck hold and head lift performed for 24 seconds and pt reports that she can sit at her desk for about 30 minutes before discomfort or compensation occurs (in progress)   Sessions: 16     Goal 4: Patient will demonstrate R shoulder flexion AROM of greater than or equal to 155 degrees to allow patient to lift objects into overhead cabinets without pain and without compensation    02/16/22 KD: R shoulder flexion AROM 145 (in progress)     Sessions: 16                                                Lorenza Cambridge, DPT

## 2022-02-21 ENCOUNTER — Inpatient Hospital Stay: Payer: BLUE CROSS/BLUE SHIELD | Admitting: Physical Therapist

## 2022-02-21 NOTE — PT/OT Therapy Note (Deleted)
Name: Vyctoria Dickman Age: 41 y.o.   Date of Service: 02/21/2022  Referring Physician: Elder Cyphers, MD   Date of Injury: No data was found  Date Care Plan Established/Reviewed: No data was found  Date Treatment Started: No data was found  Date Care Plan Established/Reviewed No data was found  Date Treatment Started No data was found  (Historic) Date Care Plan Established/Reviewed No data was found  (Historic) Date Treatment Started No data was found   End of Certification Date: No data was found  Sessions in Plan of Care: No data was found  Surgery Date: No data was found  MD Follow-up: No data was found  Medbridge Code: No data was found    Visit Count: Visit count could not be calculated. Make sure you are using a visit which is associated with an episode.   Diagnosis: No diagnosis found.    Subjective     Social Support/Occupation    Lives in: multiple level home    Lives with: spouse    Occupation: Pensions consultant (desk)      Precautions: No data was found  Allergies: Fruit blend flavor [flavoring agent], Adhesive [wound dressing adhesive], Latex, Oxycodone, and Other    Objective                     Treatment     Therapeutic Exercises - Justified to address any of the following:  To develop strength, endurance, ROM and/or flexibility.   Subjective taken for planning of session     Supine chintuck 1x10 with 1-2" hold     Seated cervical lateral flexion with UE overpressure 1x15 with 1-2" hold on each side     Seated shoulder shrugs forward 1x15, backwards 1x15           Neuromuscular Re-Education - Justified to address any of the following:   Re-education of movement, balance, coordination, kinesthetic sense, posture and/or proprioception for sitting and/or standing activities.   Supine chintuck hold with head lift 1x8 with 10" hold    Seated band pull aparts GTB 2x10     Standing rows blue TB 2x10 with 3" hold       Manual Therapy - Justified to address any of the following:    Mobilization of joints and  soft tissues, manipulation, manual lymphatic drainage, and/or manual traction.    STM to B suboccipitals, cervical paraspinals, SCM, UT, and LS in supine         Assessment   maintenance in cervical DNF endurance and slight improvement in R shoulder ROM.    improve cervical ROM and mobility,  strengthen B upper quarter and DNF strength, and reinforce patient education in order to return to PLOF.     Plan   Continue with POC to address to cervical strength and improve scapular muscular strength in order to improve postural stability. Objective measures taken on: 02/16/22       Thressa Sheller, DPT

## 2022-02-24 ENCOUNTER — Encounter (INDEPENDENT_AMBULATORY_CARE_PROVIDER_SITE_OTHER): Payer: Self-pay | Admitting: Obstetrics and Gynecology

## 2022-02-24 ENCOUNTER — Inpatient Hospital Stay: Payer: BLUE CROSS/BLUE SHIELD | Attending: Neurological Surgery | Admitting: Physical Therapist

## 2022-02-24 ENCOUNTER — Ambulatory Visit (INDEPENDENT_AMBULATORY_CARE_PROVIDER_SITE_OTHER): Payer: BLUE CROSS/BLUE SHIELD | Admitting: Obstetrics and Gynecology

## 2022-02-24 VITALS — BP 140/80 | Ht 68.0 in | Wt 315.0 lb

## 2022-02-24 DIAGNOSIS — Z1231 Encounter for screening mammogram for malignant neoplasm of breast: Secondary | ICD-10-CM

## 2022-02-24 DIAGNOSIS — G4486 Cervicogenic headache: Secondary | ICD-10-CM | POA: Insufficient documentation

## 2022-02-24 DIAGNOSIS — N83209 Unspecified ovarian cyst, unspecified side: Secondary | ICD-10-CM

## 2022-02-24 DIAGNOSIS — Z01419 Encounter for gynecological examination (general) (routine) without abnormal findings: Secondary | ICD-10-CM

## 2022-02-24 NOTE — PT/OT Therapy Note (Signed)
Name: Hailey [name] Age: 41 y.o.   Date of Service: 02/24/2022  Referring Physician: Elder Cyphers, MD   Date of Injury: 12/15/2021  Date Care Plan Established/Reviewed: 01/18/2022  Date Treatment Started: 01/18/2022  Date Care Plan Established/Reviewed No data was found  Date Treatment Started No data was found  (Historic) Date Care Plan Established/Reviewed No data was found  (Historic) Date Treatment Started No data was found   End of Certification Date: 04/17/2022  Sessions in Plan of Care: 16  Surgery Date: No data was found  MD Follow-up: No data was found  Medbridge Code: No data was found    Visit Count: 7   Diagnosis:   1. Cervicogenic headache        Subjective     Daily Subjective   Patient reports migraine and dizziness over the past two days; better today and yesterday.  Feels muscles are very tight. Right shoulder is feeling better, aware of it throughout the day with sitting/typing.    Social Support/Occupation    Lives in: multiple level home    Lives with: spouse    Occupation: Pensions consultant (desk)      Precautions: No data was found  Allergies: Fruit blend flavor [flavoring agent], Adhesive [wound dressing adhesive], Latex, Oxycodone, and Other    Objective                     Treatment     Therapeutic Exercises - Justified to address any of the following:  To develop strength, endurance, ROM and/or flexibility.   Subjective taken for planning of session     LS stretching by therapist 2x60sec B by therapist in supine    Shoulder rolls x10 each with #4    HEP updated and provided to patient           Neuromuscular Re-Education - Justified to address any of the following:   Re-education of movement, balance, coordination, kinesthetic sense, posture and/or proprioception for sitting and/or standing activities.   Supine chin tuck hold with head lift x10 cuing for retraction maintenance     Supine horizontal abd with cervical rotation x10 B vs GTB cuing to decrease UT compensation    Seated ER vs GTB  x15 cuing for spinal stacking     Shoulder extension vs GTB x15 cuing for eccentric control    Scaption #2 x15 cuing to stay in plane of motion     CK[facility] shoulder taps x10 cuing for appropriate weight shift     Manual Therapy - Justified to address any of the following:    Mobilization of joints and soft tissues, manipulation, manual lymphatic drainage, and/or manual traction.    STM to B suboccipitals, cervical paraspinals, SCM, UT, and LS in supine    SOR        Home Exercises   Access Code: [email or web address]  URL: [email or web address]  Date: 02/24/2022  Prepared by: Thressa Sheller    Exercises  - Sidelying Open Book Thoracic Lumbar Rotation and Extension  - 1 x daily - 7 x weekly - 10 reps - 5 hold  - Supine Deep Neck Flexor Training - Repetitions  - 1 x daily - 7 x weekly - 2 sets - 10 reps  - Seated Shoulder Rolls  - 1 x daily - 7 x weekly - 2 sets - 10 reps  - Seated Upper Trapezius Stretch  - 1 x daily - 7 x weekly - 1 sets - 3 reps - 30  hold  - Seated Levator Scapulae Stretch  - 1 x daily - 7 x weekly - 1 sets - 3 reps - 30 hold  - Supine Shoulder Horizontal Abduction with Resistance  - 1 x daily - 7 x weekly - 2 sets - 10 reps  - Shoulder External Rotation and Scapular Retraction with Resistance  - 1 x daily - 7 x weekly - 2 sets - 10 reps  - Scaption with Dumbbells  - 1 x daily - 7 x weekly - 2 sets - 10 reps     ---      Flowsheet Row ---   Total Time    Timed Minutes 44 minutes   Total Time 44 minutes          Assessment   Patient tolerated therapy session well with continued emphasis on cervical and postural stability.  Patient fatigued appropriately with POC exercises and progressions.  Patient demonstrates difficulty maintaining neutral cervical position throughout UE dynamic motions secondary to decreased endurance and motor control of DNF.  Patient provided with updated HEP, verbalized understanding and intent to comply.    Plan   Continue with POC to address to cervical strength and improve scapular  muscular strength in order to improve postural stability. Objective measures taken on: 02/16/22     Progress [facility]      Goals      Goal 1: Patient will demonstrate independence in prescribed HEP with proper form, sets and reps for safe discharge to an independent program.    02/16/22 KD: Pt was performing HEP consistently prior to being sick (in progress)      Sessions: 16     Goal 2: Patient will demonstrate improvement in FOTO score from initial score of 56  to score of 66  to demonstrate functional improvement during course of care    02/16/22 KD: FOTO score 54/66 (In progress)     Sessions: 16     Goal 3: Patient will demonstrate supine chin tuck and lift for 30 seconds to demonstrate deep neck flexor strength to allow patient to sit for desk work for 3 hours without pain and without compensation     02/16/22 KD: Pt supine chintuck hold and head lift performed for 24 seconds and pt reports that she can sit at her desk for about 30 minutes before discomfort or compensation occurs (in progress)   Sessions: 16     Goal 4: Patient will demonstrate R shoulder flexion AROM of greater than or equal to 155 degrees to allow patient to lift objects into overhead cabinets without pain and without compensation    02/16/22 KD: R shoulder flexion AROM 145 (in progress)     Sessions: 16                                                Thressa Sheller, DPT
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
## 2022-02-28 ENCOUNTER — Inpatient Hospital Stay: Payer: BLUE CROSS/BLUE SHIELD | Attending: Neurological Surgery | Admitting: Physical Therapist

## 2022-02-28 DIAGNOSIS — G4486 Cervicogenic headache: Secondary | ICD-10-CM | POA: Insufficient documentation

## 2022-02-28 NOTE — PT/OT Therapy Note (Signed)
Name: Hailey [name] Age: 41 y.o.   Date of Service: 02/28/2022  Referring Physician: Elder CyphersGorsen, Robert M, MD   Date of Injury: 12/15/2021  Date Care Plan Established/Reviewed: 01/18/2022  Date Treatment Started: 01/18/2022  Date Care Plan Established/Reviewed No data was found  Date Treatment Started No data was found  (Historic) Date Care Plan Established/Reviewed No data was found  (Historic) Date Treatment Started No data was found   End of Certification Date: 04/17/2022  Sessions in Plan of Care: 16  Surgery Date: No data was found  MD Follow-up: No data was found  Medbridge Code: No data was found    Visit Count: 8   Diagnosis:   1. Cervicogenic headache        Subjective     Daily Subjective   Patient states new HEP is going well however is having some pain with chin tucks at the base of her skill that dissipates when she stops.  Has had three migraines in the last week as well as dizziness; following up with MD in the next two weeks to address.  Feels her sleeping position is not helping her symptoms at all, usually side sleeper.    Social Support/Occupation    Lives in: multiple level home    Lives with: spouse    Occupation: Pensions consultantAttorney (desk)      Precautions: No data was found  Allergies: Fruit blend flavor [flavoring agent], Adhesive [wound dressing adhesive], Latex, Oxycodone, and Other    Objective                     Treatment     Therapeutic Exercises - Justified to address any of the following:  To develop strength, endurance, ROM and/or flexibility.   Subjective taken for planning of session     Supine cervical rotation x10 B cuing for full AROM    Shoulder rolls x10 each with #4          Neuromuscular Re-Education - Justified to address any of the following:   Re-education of movement, balance, coordination, kinesthetic sense, posture and/or proprioception for sitting and/or standing activities.   Supine chin tuck hold with head lift x10 cuing for retraction maintenance     Shoulder extension vs BTB  2X10 cuing for eccentric control    Standing horizontal abd vs GTB 2x10 cuing to decrease UT compensation    CKC shoulder taps x10 cuing for appropriate weight shift     Manual Therapy - Justified to address any of the following:    Mobilization of joints and soft tissues, manipulation, manual lymphatic drainage, and/or manual traction.    STM to B suboccipitals, cervical paraspinals, SCM, UT, and LS in supine    SOR    Joint mobility grade III of OA and AA        Therapeutic Activity - Justified to address the following:  Dynamic activities to improve functional performance.  Sleeping position discussion with proper neck position     Home Exercises   Access Code: [email or web address]  URL: [email or web address]  Date: 02/24/2022  Prepared by: Thressa ShellerElise Cassidie Veiga    Exercises  - Sidelying Open Book Thoracic Lumbar Rotation and Extension  - 1 x daily - 7 x weekly - 10 reps - 5 hold  - Supine Deep Neck Flexor Training - Repetitions  - 1 x daily - 7 x weekly - 2 sets - 10 reps  - Seated Shoulder Rolls  - 1 x daily - 7 x weekly -  2 sets - 10 reps  - Seated Upper Trapezius Stretch  - 1 x daily - 7 x weekly - 1 sets - 3 reps - 30 hold  - Seated Levator Scapulae Stretch  - 1 x daily - 7 x weekly - 1 sets - 3 reps - 30 hold  - Supine Shoulder Horizontal Abduction with Resistance  - 1 x daily - 7 x weekly - 2 sets - 10 reps  - Shoulder External Rotation and Scapular Retraction with Resistance  - 1 x daily - 7 x weekly - 2 sets - 10 reps  - Scaption with Dumbbells  - 1 x daily - 7 x weekly - 2 sets - 10 reps       ---      Flowsheet Row ---   Total Time    Timed Minutes 45 minutes   Total Time 45 minutes          Assessment   Patient presents with continued symptoms of dizziness and migraines, will be following up with MD to determine causation.  Reports compliance with HEP.  Moderated time today spend on sleeping position discussion to improve neutral neck position, verbalized understanding and intent to comply.  Plan   Continue with  POC to address to cervical strength and improve scapular muscular strength in order to improve postural stability. Objective measures taken on: 02/16/22     Progress CKC      Goals      Goal 1: Patient will demonstrate independence in prescribed HEP with proper form, sets and reps for safe discharge to an independent program.    02/16/22 KD: Pt was performing HEP consistently prior to being sick (in progress)      Sessions: 16     Goal 2: Patient will demonstrate improvement in FOTO score from initial score of 56  to score of 66  to demonstrate functional improvement during course of care    02/16/22 KD: FOTO score 54/66 (In progress)     Sessions: 16     Goal 3: Patient will demonstrate supine chin tuck and lift for 30 seconds to demonstrate deep neck flexor strength to allow patient to sit for desk work for 3 hours without pain and without compensation     02/16/22 KD: Pt supine chintuck hold and head lift performed for 24 seconds and pt reports that she can sit at her desk for about 30 minutes before discomfort or compensation occurs (in progress)   Sessions: 16     Goal 4: Patient will demonstrate R shoulder flexion AROM of greater than or equal to 155 degrees to allow patient to lift objects into overhead cabinets without pain and without compensation    02/16/22 KD: R shoulder flexion AROM 145 (in progress)     Sessions: 16                                                Thressa Sheller, DPT

## 2022-03-02 ENCOUNTER — Encounter (INDEPENDENT_AMBULATORY_CARE_PROVIDER_SITE_OTHER): Payer: Self-pay | Admitting: Clinical Cardiac Electrophysiology

## 2022-03-02 ENCOUNTER — Ambulatory Visit (INDEPENDENT_AMBULATORY_CARE_PROVIDER_SITE_OTHER): Payer: BLUE CROSS/BLUE SHIELD | Admitting: Clinical Cardiac Electrophysiology

## 2022-03-02 ENCOUNTER — Ambulatory Visit (INDEPENDENT_AMBULATORY_CARE_PROVIDER_SITE_OTHER): Payer: BLUE CROSS/BLUE SHIELD

## 2022-03-02 VITALS — BP 124/80 | HR 100 | Ht 68.0 in | Wt 314.0 lb

## 2022-03-02 DIAGNOSIS — I493 Ventricular premature depolarization: Secondary | ICD-10-CM

## 2022-03-02 DIAGNOSIS — R002 Palpitations: Secondary | ICD-10-CM

## 2022-03-02 DIAGNOSIS — Z8679 Personal history of other diseases of the circulatory system: Secondary | ICD-10-CM

## 2022-03-02 NOTE — Progress Notes (Signed)
Registered vital connect. 72 hr monitor. Patch [id number]    Patient has intolerance to adhesive but she would like to attempt wearing the monitor. S/w Helmut Muster at vital connect, she recommended OTC hydrocortisone cream be applied over the skin and then apply monitor once skin has completely dried. Walked through connecting monitor to phone, advised calling vital connect or clinic for assistance. Given second patch in case she has intolerance to adhesion and she can apply new monitor to different area. Patient advised to discontinue monitor if she developed any discomfort.

## 2022-03-02 NOTE — Progress Notes (Signed)
[facility] ARRHYTHM[facility] NEW OFFICE CONSULTATION    I had the pleasure of seeing Hailey [name] today for outpatient cardiac electrophysiology consultation. She presents for evaluation of palpitations, potential bradycardia, lightheadedness.  She is a patient of Dr. Wilmon Arms.     HPI:     The patient is a very pleasant 41 year old woman who underwent ablation in 2005 at [facility] [place] for Wolff-Parkinson-White.  This took care of her syncope and sustained palpitations.    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  She sees Dr. Adin Hector.  She has a history of migraines, has seen Dr. Theodoro Grist.    She has been told at different doctors visits that she has heart rates in the 40s.  This has also been indicated to her by her Apple watch.  She sometimes feels skipped beats, but has none of the palpitations like prior to her ablation.  She gets episodes of lightheadedness.  As opposed to sometimes feeling that the room is spinning, she feels lightheaded.  This happens perhaps 4 or 5 days/week.  She has to steady herself.    No history of hypertension diabetes hypercholesterolemia or cigarette smoking.    No family history available, as she is adopted.    She is an Pensions consultant for [employer].      PMH:   Past Medical History:   Diagnosis Date    Abnormal Pap smear of cervix     Anemia ~12    Pre-hysterectomy    Behcet's disease     BMI 45.0-49.9, adult     Breast lump     Diarrhea     occasional    Fever     for past ~ 3 months (avg daily fever 100-101)    Gastroesophageal reflux disease     Headache     Herpes simplex virus (HSV) infection     Lower back pain     Meningitis     Neck pain     Post-operative nausea and vomiting     Scoliosis     Swollen lymph nodes         MEDICATIONS:     Current Outpatient Medications   Medication Sig Dispense Refill    Adalimumab 40 MG/0.4ML Pen-injector Kit Inject 40 mg into the skin every 10 (ten) days 6 each 3    Azelastine-Fluticasone 137-50 MCG/ACT Suspension 2 (two)  times daily as needed      busPIRone (BUSPAR) 5 MG tablet TAKE 1 TABLET (5 MG) BY MOUTH 3 (THREE) TIMES DAILY AS NEEDED (ANXIETY) 270 tablet 0    clindamycin (CLEOCIN T) 1 % lotion APPLY TWICE A DAY TO BREAKOUTS ON FACE, WASH WITH OVER THE COUNTER BENZOYL PEROXIDE BEFORE HAND      colesevelam (WELCHOL) 625 MG tablet Take 1 tablet (625 mg) by mouth 2 (two) times daily with meals 180 tablet 3    Dapsone 7.5 % Gel APPLY DAILY TO ACNE/FOLLICULITIS ON FACE      fexofenadine (Allegra Allergy) 180 MG tablet       fluocinonide (LIDEX) 0.05 % gel APPLY TO AFFECTED SORES ON SKIN OR R MOUTH TWICE DAILY UNTIL RESOLVED.      hydrOXYzine (ATARAX) 10 MG tablet Take 1 tablet (10 mg) by mouth nightly 90 tablet 3    Multiple Vitamins-Minerals (MULTIVITAMIN ADULT PO) Take by mouth daily         Omega-3 1000 MG Cap Take by mouth      ondansetron (ZOFRAN-ODT) 4 MG  disintegrating tablet Take 1 tablet (4 mg) by mouth every 8 (eight) hours as needed for Nausea 30 tablet 0    Otezla 30 MG Tab TAKE 1 TABLET BY MOUTH 2 TIMES A DAY. 60 tablet 2    Rimegepant Sulfate 75 MG Tablet Dispersible Take 1 tablet (75 mg) by mouth daily as needed (headache) 30 tablet 11    rizatriptan (MAXALT) 10 MG tablet Take 1-2 tablets (10-20 mg total) by mouth once as needed for Migraine Max 3 days a week 36 tablet 3    vitamin D (CHOLECALCIFEROL) 25 MCG (1000 UT) tablet Take 1,000 Units by mouth daily      EPINEPHrine (EPIPEN 2-PAK) 0.3 MG/0.3ML Solution Auto-injector injection Inject 0.3 mLs (0.3 mg) into the muscle once as needed (Anaphylaxis) ; go to ER after injection (Patient not taking: Reported on 01/14/2022) 1 each 1    lidocaine-prilocaine (EMLA) cream  (Patient not taking: Reported on 03/02/2022)       No current facility-administered medications for this visit.        SH:   Social History     Tobacco Use    Smoking status: Never    Smokeless tobacco: Never   Vaping Use    Vaping status: Never Used   Substance Use Topics    Alcohol use: Not Currently      Alcohol/week: 0.0 standard drinks of alcohol     Comment: rarely    Drug use: Never       FH: no family history of sudden death.  Family history reviewed and is otherwise not pertinent    REVIEW OF SYSTEMS: All other systems reviewed and negative except as above.    PHYSICAL EXAMINATION  General Appearance: A well-appearing female in no acute distress.   Vital Signs: BP 124/80 (BP Site: Left arm, Patient Position: Sitting, Cuff Size: Large)   Pulse 100   Ht 1.727 m (5\' 8" )   Wt 142.4 kg (314 lb)   LMP  (LMP Unknown)   BMI 47.74 kg/m    HEENT: Sclera anicteric, conjunctiva without pallor, moist mucous membranes, normal dentition.   Neck: Supple without jugular venous distention. Thyroid nonpalpable. Normal carotid upstrokes without bruits.  Chest: Clear to auscultation bilaterally with good air movement and respiratory effort and no wheezes, rales, or rhonchi  Cardiovascular: Normal S1 and physiologically split S2 without murmurs, gallops or rub. PMI of normal size and nondisplaced.   Abdomen: Soft, nontender. No organomegaly.  No pulsatile masses or bruits.    Extremities: Warm without edema. All peripheral pulses are full and equal.  Skin: No rash, xanthoma or xanthelasma.   Neuro: Alert and oriented x3. Grossly intact. Strength is symmetrical. Normal mood and affect.     ECG: Sinus with bigeminy.  PVC morphology is upright in leads II, III and aVL.  Upright in V1 and V2, consistent with left ventricular outflow tract origin.  The coupling interval is a bit variable.    IMPRESSION/RECOMMENDATIONS: Ms. Hailey [name] is a 41 y.o. female who presents for outpatient consultation for evaluation of episodes of lightheadedness, skipped beats, and a patient whom we see today to have frequent ventricular ectopy.     The bigeminy explains the spurious heart rate of 40.  The PVC may not generate an effective systole, and the pulse ox, or person feeling a pulse, may feel only the narrow complex.     Bigeminy, and a low  effective heart rate, may explain her dizziness.    When we see frequent  ectopy, we need to see if there is any underlying structural heart disease, and quantify it.  If the burden of ectopy exceeds 20%, we need to be concerned about the possibility of developing a longer-term cardiomyopathy.  We would need to be aggressive about it.  If the percentage is less, we do not need to worry about a myopathy, but still need to worry about any symptoms that she may have relative to it.    We will start with an echocardiogram and a 72-hour Holter.  Depending upon the results, may also need to consider a cardiac MRI in a patient with Behcet's.

## 2022-03-03 ENCOUNTER — Inpatient Hospital Stay: Payer: BLUE CROSS/BLUE SHIELD | Admitting: Physical Therapist

## 2022-03-05 ENCOUNTER — Other Ambulatory Visit (INDEPENDENT_AMBULATORY_CARE_PROVIDER_SITE_OTHER): Payer: Self-pay | Admitting: Obstetrics and Gynecology

## 2022-03-07 ENCOUNTER — Inpatient Hospital Stay: Payer: BLUE CROSS/BLUE SHIELD | Attending: Neurological Surgery | Admitting: Physical Therapist

## 2022-03-07 ENCOUNTER — Telehealth (INDEPENDENT_AMBULATORY_CARE_PROVIDER_SITE_OTHER): Payer: BLUE CROSS/BLUE SHIELD | Admitting: Internal Medicine

## 2022-03-07 ENCOUNTER — Encounter (INDEPENDENT_AMBULATORY_CARE_PROVIDER_SITE_OTHER): Payer: Self-pay | Admitting: Internal Medicine

## 2022-03-07 ENCOUNTER — Telehealth: Payer: Self-pay

## 2022-03-07 DIAGNOSIS — M0600 Rheumatoid arthritis without rheumatoid factor, unspecified site: Secondary | ICD-10-CM

## 2022-03-07 DIAGNOSIS — K760 Fatty (change of) liver, not elsewhere classified: Secondary | ICD-10-CM

## 2022-03-07 DIAGNOSIS — M352 Behcet's disease: Secondary | ICD-10-CM

## 2022-03-07 DIAGNOSIS — Z79899 Other long term (current) drug therapy: Secondary | ICD-10-CM

## 2022-03-07 DIAGNOSIS — K120 Recurrent oral aphthae: Secondary | ICD-10-CM

## 2022-03-07 DIAGNOSIS — G4486 Cervicogenic headache: Secondary | ICD-10-CM

## 2022-03-07 MED ORDER — ADALIMUMAB 40 MG/0.4ML SC PNKT
40.0000 mg | PEN_INJECTOR | SUBCUTANEOUS | 1 refills | Status: DC
Start: 2022-03-07 — End: 2022-05-05

## 2022-03-07 NOTE — PT/OT Therapy Note (Addendum)
Name: Hailey [name] Age: 41 y.o.   Date of Service: 03/07/2022  Referring Physician: Elder Cyphers, MD   Date of Injury: 12/15/2021  Date Care Plan Established/Reviewed: 01/18/2022  Date Treatment Started: 01/18/2022  Date Care Plan Established/Reviewed No data was found  Date Treatment Started No data was found  (Historic) Date Care Plan Established/Reviewed No data was found  (Historic) Date Treatment Started No data was found   End of Certification Date: 04/17/2022  Sessions in Plan of Care: 16  Surgery Date: No data was found  MD Follow-up: No data was found  Medbridge Code: No data was found    Visit Count: 9   Diagnosis:   1. Cervicogenic headache        Subjective     Daily Subjective   Patient states having to cancel last apt due to needing to wear heart rate monitor for 72 hours.  Overall stiffness is less and HEP is going well.  Has follow up with cardiology, derm, rheumatology this week.  Only one migraines since last session.  Did adjust her pillows after last session which has helped her morning stiffness and symptoms.    Social Support/Occupation    Lives in: multiple level home    Lives with: spouse    Occupation: Pensions consultant (desk)      Precautions: No data was found  Allergies: Fruit blend flavor [flavoring agent], Adhesive [wound dressing adhesive], Latex, Oxycodone, and Other    Objective                     Treatment     Therapeutic Exercises - Justified to address any of the following:  To develop strength, endurance, ROM and/or flexibility.   Subjective taken for planning of session     Objective measures updated and goals accordingly; FOTO captured    Shoulder flexion #4 bar 2x10 cuing for eccentric return     Alternating bicep curls #4 2x10 cuing full AROM              Neuromuscular Re-Education - Justified to address any of the following:   Re-education of movement, balance, coordination, kinesthetic sense, posture and/or proprioception for sitting and/or standing activities.   Supine  chin tuck hold with head lift x10 cuing for retraction maintenance     Bent over row #5 2x10 B at table cuing for alignment of spine     Serratus flexion x15 vs RTB cuing for eccentric control     Manual Therapy - Justified to address any of the following:    Mobilization of joints and soft tissues, manipulation, manual lymphatic drainage, and/or manual traction.    STM to B suboccipitals, cervical paraspinals, SCM, UT, and LS in supine    SOR    Joint mobility grade III of OA and AA    Home Exercises   Access Code: [email or web address]  URL: [email or web address]  Date: 02/24/2022  Prepared by: Thressa Sheller    Exercises  - Sidelying Open Book Thoracic Lumbar Rotation and Extension  - 1 x daily - 7 x weekly - 10 reps - 5 hold  - Supine Deep Neck Flexor Training - Repetitions  - 1 x daily - 7 x weekly - 2 sets - 10 reps  - Seated Shoulder Rolls  - 1 x daily - 7 x weekly - 2 sets - 10 reps  - Seated Upper Trapezius Stretch  - 1 x daily - 7 x weekly - 1 sets - 3 reps -  30 hold  - Seated Levator Scapulae Stretch  - 1 x daily - 7 x weekly - 1 sets - 3 reps - 30 hold  - Supine Shoulder Horizontal Abduction with Resistance  - 1 x daily - 7 x weekly - 2 sets - 10 reps  - Shoulder External Rotation and Scapular Retraction with Resistance  - 1 x daily - 7 x weekly - 2 sets - 10 reps  - Scaption with Dumbbells  - 1 x daily - 7 x weekly - 2 sets - 10 reps       ---      Flowsheet Row ---   Total Time    Timed Minutes 40 minutes   Total Time 40 minutes          Assessment   Patient reports improved ability to sit for up to two hours before symptoms begin in her neck, also less stiffness throughout the neck and reduction in migraine frequency.  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  Patient fatigued appropriately and is progressing well toward therapy goals at this time.   Plan   Continue with POC to address to cervical strength and improve scapular muscular strength in order to improve postural stability.  Objective measures taken on: 02/16/22       Goals      Goal 1: Patient will demonstrate independence in prescribed HEP with proper form, sets and reps for safe discharge to an independent program.    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 and without compensation     02/16/22 KD: Pt supine chintuck hold and head lift performed for 24 seconds and pt reports that she can sit at her desk for about 30 minutes before discomfort or compensation occurs (in progress)    03/07/22 ESQ  Reports ability to sit for two hours before symptoms    Sessions: 16     Goal 4: Patient will demonstrate R shoulder flexion AROM of greater than or equal to 155 degrees to allow patient to lift objects into overhead cabinets without pain and without compensation    02/16/22 KD: R shoulder flexion AROM 145 (in progress)    03/07/22 ESQ  Goal met    Sessions: 16                                                Thressa ShellerElise Deidre Carino, DPT

## 2022-03-07 NOTE — Progress Notes (Signed)
RHEUMATOLOGY FOLLOW UP TELEMEDICINE NOTE  Telemedicine Documentation Requirements  Originating site (Patient location): home  Distant site (Provider location): Office  Provider and Title: Dr. Adin Hector  Type of Visit: Video Visit  Verbal Consent has been obtained to conduct a telemedicine visit on this date in order to minimize exposure to COVID-19.          PCP: Virgilio Frees, MD    HPI:    This patient is a 41 y.o. year female with Past Medical History of Prior Cholecystectomy, Wold-Parkinson White, Prior Hysterectomy who returns for follow up of Behcet's HLAB51+, recurrent oral ulcers, pathergy, low grade fevers and seronegative arthritis.      Interval History:    She recovered from sterp throat-2 weeks of bad symptoms.    She resumed her meds-she has been on Humira for 2-3 shots.  She is back on [place].  She takes Humira every ten days.      She saw Dr. Cameron Ali is having frequent PVCs, he suspects stress related-wants to evaluate for any potential structural heart issues    She is in PT for neck pain/migraines-she thinks the PT is helping.    She has reported occasional vaginal ulcers-not really painful.    Dermatologist started her on dapsone-she has had a few scars that are healing-had injections from her dermatologist.    Derm: Dr. Shellee Milo      Previous Labs:  01/14/22  CMP wnl  CRP wnl  ViT D 31  ESR wnl  Lipid panel wnl  TSH wnl      08/18/21  CBC wnl  CMP wnl  ESR wnl  CRP wnl    Previous Labs:  05/25/21  Quant Gold-  HepbSAb wnl  HepbSAg-  HepCAb-  HIV-        Previous Labs:  02/23/21  ESR wnl  CRP wnl  CMP ALT 42, AST normal    CRP wnl  ESR wnl  HLAB51 positive  IgE, IgA, IGG, IgM all wnl  CBC wnl    12/03/20  DIAGNOSIS:          A. Lesion, ulnar wrist, excision:     -Fibroadipose tissue and vasculature          B. Synovial tissue, biopsy:     -Fibroadipose tissue and synovium (see comment)          Comment:     Morphologic features typical of rheumatoid arthritis or infection are     not  identified.    12/03/20  Bacterial Cultures: No Growth  Fungal Cultures: NG at one week  AFB: No growth    11/20/20  ESR 50  CRP wnl  IgG subclasses wnl  ANA comprehensive panel negative        Previous Labs:    [facility] Labs:  10/23/20  C-ANCA, P-ANCA, MPO, PR3 all negative  Mitochondrial Ab-  ANA IFA-  Scl70-  Centromere-  RNP-  SMith-  TPPA Ab-      05/18/20  Ferritin 291  CMP AST/A:T 59/70  CBC elevated lymphocytes    02/06/20  MPO-  PR3-  SSA-  aCL IgM 14  aCL IgG, IgA -  Lupus anticoagulant -  B2IgG, IgM -  ACE wnl  CCP-  RF-  Vit D 31  TSH wnl  ESR wnl  CK wnl  C4 wnl  C3 wnl  CRP wnl    07/2019  ANA Screen-  LDH wnl    07/23/2018 lymph node biopsy:DIAGNOSIS:  Lymph node, deep right neck, excisional biopsy:     -Lymph node with non-specific reactive changes   Wound, AFB and Fungal cultures negative at this time    BONE MARROW 05/03/2019      BONE MARROW, LEFT POSTERIOR ILIAC CREST, CORE BIOPSY, CLOT SECTION,    ASPIRATE SMEAR AND TOUCH PREP:        - NORMOCELLULAR BONE MARROW (approximate50-60%) WITH TRILINEAGE HEMATOPOIESIS,  NORMAL MYELOID TO ERYTHROID RATIO AND ADEQUATE NUMBERS OF    MEGAKARYOCYTES WIT H UNREMARKABLE MORPHOLOGY        - A FEW SMALL REACTIVE LYMPHOID AGGREGATES IDENTIFIED, AND MILDLY  INCREASED NUMBERS OF EOSINOPHILS        - PENDING CYTOGENETIC STUDIES OF BONE MARROW SAMPLE  - Remain pending as of 05/14/2019.        COMMENT:    The concurrent flow cytometry performed on the bone marrow aspirate  sample shows no evidence of immunophenotypic abnormalities      Imaging Reviewed:    08/16/20  MRI Brain:IMPRESSION:      1. No intracranial mass, hemorrhage, or hydrocephalus is detected.  2. There is normal pattern of enhancement of the brain parenchyma and  meningeal surfaces.    9/18 MRI Abdomen:  IMPRESSION:      1. Diffuse hepatic steatosis. No suspicious focal hepatic lesions.  2. No biliary ductal dilatation or choledocholithiasis.    02/03/20  MRI Pelvis:  IMPRESSION:         Cystic structure  at the vaginal cuff, flank by both ovaries not  significantly changed in size compared to 2019. No abnormal internal  enhancement. This favors a benign process such as a peritoneal inclusion  cyst or a paraovarian cyst.    12/14/19 MRI R Hand:  IMPRESSION:      1.  No suspicious soft tissue mass, osseous mass, or masslike  enhancement in the right hand or wrist to correlate with the diffuse FDG  uptake on recent PET/CT.  2.  There is mild enhancing synovitis in the right wrist joint without  active joint erosion or bony destruction.  3.  A small dorsal ganglion cyst overlying the scapholunate interval.    12/06/19  Full Body PET Scan:    Hypermetabolic soft tissue mass in palmar R hand surface at base of the second digit-stable pulmonary nodulke 1.3 cm left lower lobe-recommend follow up Chest CT 3 months         The following sections were reviewed this encounter by the provider:   Tobacco  Allergies  Meds  Problems  Med Hx  Surg Hx  Fam Hx         PMH/PSH:  Past Medical History:   Diagnosis Date    Abnormal Pap smear of cervix     Anemia ~12    Pre-hysterectomy    Behcet's disease     BMI 45.0-49.9, adult     Breast lump     Diarrhea     occasional    Fever     for past ~ 3 months (avg daily fever 100-101)    Gastroesophageal reflux disease     Headache     Herpes simplex virus (HSV) infection     Lower back pain     Meningitis     Neck pain     Post-operative nausea and vomiting     Scoliosis     Swollen lymph nodes         Past Surgical History:   Procedure  Laterality Date    ablasion  2005    ARTHROTOMY, WRIST Right 12/03/2020    Procedure: RIGHT UPPER EXTREMITY SYNOVIAL BIOPSY;  Surgeon: Kellie Simmering, MD;  Location: [facility] [facility];  Service: Plastics;  Laterality: Right;    BIOPSY, LYMPH NODE N/A 07/23/2018    Procedure: BIOPSY, LYMPH NODE;  Surgeon: Shelda Pal, MD;  Location: [facility];  Service: ENT;  Laterality: N/A;  NECK DEEP LYMPH NODE BIOPSY    BONE MARROW BIOPSY  2020     CHOLECYSTECTOMY  2009    D&C DIAGNOSTIC  2010    DILATION AND CURETTAGE OF UTERUS  2010    FINGER GANGLION CYST EXCISION  04/2018    GANGLION CYST EXCISION  2014    04/2018 cyst removed from index finger-[facility]    HERNIA REPAIR  2016    HYSTERECTOMY  2012    PELVIC LAPAROSCOPY  2011    UMBILICAL HERNIA REPAIR  2016    UPPER GASTROINTESTINAL ENDOSCOPY  2009    WISDOM TOOTH EXTRACTION  1998        FH/SH:  Family History   Adopted: Yes       Social History     Tobacco Use    Smoking status: Never    Smokeless tobacco: Never   Vaping Use    Vaping status: Never Used   Substance Use Topics    Alcohol use: Not Currently     Alcohol/week: 0.0 standard drinks of alcohol     Comment: rarely    Drug use: Never        Meds/ Allergies:  No outpatient medications have been marked as taking for the 03/07/22 encounter (Telemedicine Visit) with Baltazar Najjar, MD.     Allergies   Allergen Reactions    Fruit Blend Flavor [Flavoring Agent] Anaphylaxis and Swelling    Adhesive [Wound Dressing Adhesive]      dermabond    Latex Other (See Comments), Hives and Swelling     Swelling and redness throughout body   Also severe allergy to surgical glue    Oxycodone Other (See Comments)     Hallucinations    Other Itching and Rash     Tropical fruits like coconuts, pineapple, kiwi, etc.          PHYSICAL EXAM  General- WNWD, alert and oriented, NAD  Eyes- EOMI, PERRL, no conjunctival injection, no scleral icterus  ENMT- face symmetric without lesions  Skin- no rash, no alopecia  Pulm: No conversational dyspnea, breathing comfortable on room air      .        LABS:  Lab Results   Component Value Date    WBC 9.1 01/14/2022    HGB 15.3 01/14/2022    HCT 44.4 01/14/2022    MCV 90 01/14/2022    PLT 370 01/14/2022      Lab Results   Component Value Date    CREAT 0.82 01/14/2022    BUN 13 01/14/2022    NA 141 01/14/2022    K 5.1 01/14/2022    CL 105 01/14/2022    CO2 18 (L) 01/14/2022      Lab Results   Component Value Date    ALT 28  01/14/2022    AST 22 01/14/2022    ALKPHOS 62 01/14/2022    BILITOTAL 0.4 01/14/2022     Lab Results   Component Value Date    ESR 15 01/14/2022    ESR 6 02/23/2021  ESR 7 01/25/2021    ESR 50 (H) 11/20/2020    ESR 11 02/06/2020      Lab Results   Component Value Date    CRP 1 01/14/2022    CRP <1 02/23/2021    CRP 2 01/25/2021    CRP 3 11/20/2020    CRP 0.2 02/06/2020      No results found for: ANA   Lab Results   Component Value Date    RHEUMFACTOR <15.0 02/06/2020      Lab Results   Component Value Date    CCP <15.6 02/06/2020     No results found for: URICACID        ASSESSMENT/PLAN:    This patient is a 41 y.o. year female with Past Medical History of Prior Cholecystectomy, Wolf-Parkinson White, Prior Hysterectomy, Fatty Liver Disease who returns for follow up of Behcet's Disease and seronegative rheumatoid arthritis.    Will continue Otezla 30 mg BID, and Humira 40 mg every ten days.  Some low back pain prior to next dose-we may need to increase frequency to ever week.    Following with cardiology for work up of PVCs.    Following with Neurology for migraines.      1. Behcet's disease  - Adalimumab 40 MG/0.4ML Pen-injector Kit; Inject 40 mg into the skin every 10 (ten) days  Dispense: 6 each; Refill: 1    2. Aphthous ulcer  - Adalimumab 40 MG/0.4ML Pen-injector Kit; Inject 40 mg into the skin every 10 (ten) days  Dispense: 6 each; Refill: 1    3. Fatty liver disease, nonalcoholic  - Adalimumab 40 MG/0.4ML Pen-injector Kit; Inject 40 mg into the skin every 10 (ten) days  Dispense: 6 each; Refill: 1    4. Seronegative rheumatoid arthritis  - Adalimumab 40 MG/0.4ML Pen-injector Kit; Inject 40 mg into the skin every 10 (ten) days  Dispense: 6 each; Refill: 1    5. High risk medication use               Total Time spent reviewing records, interviewing patient and coordinating plan of care: 22 min    FOLLOW UP:  Return in about 2 months (around 05/07/2022) for Follow Up.                                                                                   Debe Coder, MD  Rheumatologist  [facility]  [address]  [place], [place] [address]  Phone: [phone]  Fax: [phone]

## 2022-03-07 NOTE — Patient Instructions (Addendum)
Dear Hailey [name] ,     Thanks for arranging a video visit with me.    You may retrieve any orders from this visit in Mychart by using these steps:    After signing into Mychart  1) click Messaging  2) click Letters  3) select the Requisition  4) Review and click print    Please contact the clinic at [phone] with any questions and to schedule your follow up visit.    Sincerely,    Debe Coder, MD  Rheumatologist  [facility]  [address]  [place], [place] [address]  Phone: [phone]  Fax: [phone]

## 2022-03-07 NOTE — Telephone Encounter (Signed)
Humira- Released new MSOT order to [facility], previous PA approval still active.

## 2022-03-09 ENCOUNTER — Ambulatory Visit
Admission: RE | Admit: 2022-03-09 | Discharge: 2022-03-09 | Disposition: A | Discharge status: Home / Self Care | Payer: BLUE CROSS/BLUE SHIELD | Source: Ambulatory Visit | Attending: Clinical Cardiac Electrophysiology | Admitting: Clinical Cardiac Electrophysiology

## 2022-03-09 DIAGNOSIS — R002 Palpitations: Secondary | ICD-10-CM | POA: Insufficient documentation

## 2022-03-09 DIAGNOSIS — I493 Ventricular premature depolarization: Secondary | ICD-10-CM | POA: Insufficient documentation

## 2022-03-10 ENCOUNTER — Inpatient Hospital Stay: Payer: BLUE CROSS/BLUE SHIELD | Attending: Neurological Surgery | Admitting: Physical Therapist

## 2022-03-10 DIAGNOSIS — G4486 Cervicogenic headache: Secondary | ICD-10-CM | POA: Insufficient documentation

## 2022-03-10 NOTE — PT/OT Therapy Note (Deleted)
Name: Hailey [name] Age: 41 y.o.   Date of Service: 03/10/2022  Referring Physician: Elder Cyphers, MD   Date of Injury: 12/15/2021  Date Care Plan Established/Reviewed: 01/18/2022  Date Treatment Started: 01/18/2022  Date Care Plan Established/Reviewed No data was found  Date Treatment Started No data was found  (Historic) Date Care Plan Established/Reviewed No data was found  (Historic) Date Treatment Started No data was found   End of Certification Date: 04/17/2022  Sessions in Plan of Care: 16  Surgery Date: No data was found  MD Follow-up: No data was found  Medbridge Code: No data was found    Visit Count: 10   Diagnosis:   1. Cervicogenic headache        Subjective     Daily Subjective   Patient report having migraine later in the day yesterday; sleeping has been better, as well as sitting at computer for work.  When she notices starting to be uncomfortable in neck is able to correct posture and feels better.    Social Support/Occupation    Lives in: multiple level home    Lives with: spouse    Occupation: Pensions consultant (desk)      Precautions: No data was found  Allergies: Fruit blend flavor [flavoring agent], Adhesive [wound dressing adhesive], Latex, Oxycodone, and Other    Objective                     Treatment     Therapeutic Exercises - Justified to address any of the following:  To develop strength, endurance, ROM and/or flexibility.   Subjective taken for planning of session     Shoulder flexion #4 bar x10 cuing for eccentric return               Neuromuscular Re-Education - Justified to address any of the following:   Re-education of movement, balance, coordination, kinesthetic sense, posture and/or proprioception for sitting and/or standing activities.   Serratus punch #5 x10 B    Rhythmic stabilization all directions R shoulder x60sec each    Supine chin tuck hold with head lift 10x5sec cuing for retraction maintenance     Scapular clocks vs RTB x5 cuing for scapular protraction     Serratus  flexion x15 vs RTB cuing for eccentric control     Manual Therapy - Justified to address any of the following:    Mobilization of joints and soft tissues, manipulation, manual lymphatic drainage, and/or manual traction.    STM to B suboccipitals, cervical paraspinals, SCM, UT, and LS in supine    SOR    Joint mobility grade III of OA and AA    Home Exercises   Access Code: [email or web address]  URL: [email or web address]  Date: 02/24/2022  Prepared by: Thressa Sheller    Exercises  - Sidelying Open Book Thoracic Lumbar Rotation and Extension  - 1 x daily - 7 x weekly - 10 reps - 5 hold  - Supine Deep Neck Flexor Training - Repetitions  - 1 x daily - 7 x weekly - 2 sets - 10 reps  - Seated Shoulder Rolls  - 1 x daily - 7 x weekly - 2 sets - 10 reps  - Seated Upper Trapezius Stretch  - 1 x daily - 7 x weekly - 1 sets - 3 reps - 30 hold  - Seated Levator Scapulae Stretch  - 1 x daily - 7 x weekly - 1 sets - 3 reps - 30 hold  -  Supine Shoulder Horizontal Abduction with Resistance  - 1 x daily - 7 x weekly - 2 sets - 10 reps  - Shoulder External Rotation and Scapular Retraction with Resistance  - 1 x daily - 7 x weekly - 2 sets - 10 reps  - Scaption with Dumbbells  - 1 x daily - 7 x weekly - 2 sets - 10 reps       ---      Flowsheet Row ---   Total Time    Timed Minutes 42 minutes   Total Time 42 minutes          Assessment   Patient reports improved ability to sit for up to two hours before symptoms begin in her neck, also less stiffness throughout the neck and reduction in migraine frequency.  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  Patient fatigued appropriately and is progressing well toward therapy goals at this time.   Plan   Continue with POC to address to cervical strength and improve scapular muscular strength in order to improve postural stability. Objective measures taken on: 02/16/22       Goals      Goal 1: Patient will demonstrate independence in prescribed HEP with proper form, sets and reps  for safe discharge to an independent program.    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 and without compensation     02/16/22 KD: Pt supine chintuck hold and head lift performed for 24 seconds and pt reports that she can sit at her desk for about 30 minutes before discomfort or compensation occurs (in progress)    03/07/22 ESQ  Reports ability to sit for two hours before symptoms    Sessions: 16     Goal 4: Patient will demonstrate R shoulder flexion AROM of greater than or equal to 155 degrees to allow patient to lift objects into overhead cabinets without pain and without compensation    02/16/22 KD: R shoulder flexion AROM 145 (in progress)    03/07/22 ESQ  Goal met    Sessions: 16                                                Thressa Sheller, DPT

## 2022-03-10 NOTE — Progress Notes (Signed)
Name:Hailey [name] Age: 41 y.o.   Date of Service: 03/10/2022  Referring Physician: Elder Cyphers, MD   Date of Injury: 12/15/2021  Date Care Plan Established/Reviewed: 01/18/2022  Date Treatment Started: 01/18/2022  Date Care Plan Established/Reviewed No data was found  Date Treatment Started No data was found  (Historic) Date Care Plan Established/Reviewed No data was found  (Historic) Date Treatment Started No data was found   End of Certification Date: 04/17/2022  Sessions in Plan of Care: 16  Surgery Date: No data was found  MD Follow-up: No data was found  Medbridge Code: No data was found    Visit Count: 10   Diagnosis:   1. Cervicogenic headache        Subjective     Daily Subjective   Patient report having migraine later in the day yesterday; sleeping has been better, as well as sitting at computer for work.  When she notices starting to be uncomfortable in neck is able to correct posture and feels better.  Reaching overhead is better, still a strain rotating to the right when driving, difficulty maintaining proper sitting posture as well as lifting from the ground.     Social Support/Occupation    Lives in: multiple level home    Lives with: spouse    Occupation: Pensions consultant (desk)      Precautions: No data was found  Allergies: Fruit blend flavor [flavoring agent], Adhesive [wound dressing adhesive], Latex, Oxycodone, and Other    Past Medical History:   Diagnosis Date    Abnormal Pap smear of cervix     Anemia ~12    Pre-hysterectomy    Behcet's disease     BMI 45.0-49.9, adult     Breast lump     Diarrhea     occasional    Fever     for past ~ 3 months (avg daily fever 100-101)    Gastroesophageal reflux disease     Headache     Herpes simplex virus (HSV) infection     Lower back pain     Meningitis     Neck pain     Post-operative nausea and vomiting     Scoliosis     Swollen lymph nodes        Objective                     Treatment     Therapeutic Exercises - Justified to address any of the  following:  To develop strength, endurance, ROM and/or flexibility.   Subjective taken for planning of session      Shoulder flexion #4 bar x10 cuing for eccentric return        Neuromuscular Re-Education - Justified to address any of the following:   Re-education of movement, balance, coordination, kinesthetic sense, posture and/or proprioception for sitting and/or standing activities.   Serratus punch #5 x10 B     Rhythmic stabilization all directions R shoulder x60sec each     Supine chin tuck hold with head lift 10x5sec cuing for retraction maintenance      Scapular clocks vs RTB x5 cuing for scapular protraction      Serratus flexion x15 vs RTB cuing for eccentric control     90/90 ER vs cuing to decrease UT compensation x10 B vs RTB    Bent over row x10 B #5 cuing for mechanics    Manual Therapy - Justified to address any of the following:    Mobilization  of joints and soft tissues, manipulation, manual lymphatic drainage, and/or manual traction.    STM to B suboccipitals, cervical paraspinals, SCM, UT, and LS in supine     SOR     Joint mobility grade III of OA and AA    Home Exercises   ccess Code: [email or web address]  URL: [email or web address]  Date: 02/24/2022  Prepared by: Thressa Sheller     Exercises  - Sidelying Open Book Thoracic Lumbar Rotation and Extension  - 1 x daily - 7 x weekly - 10 reps - 5 hold  - Supine Deep Neck Flexor Training - Repetitions  - 1 x daily - 7 x weekly - 2 sets - 10 reps  - Seated Shoulder Rolls  - 1 x daily - 7 x weekly - 2 sets - 10 reps  - Seated Upper Trapezius Stretch  - 1 x daily - 7 x weekly - 1 sets - 3 reps - 30 hold  - Seated Levator Scapulae Stretch  - 1 x daily - 7 x weekly - 1 sets - 3 reps - 30 hold  - Supine Shoulder Horizontal Abduction with Resistance  - 1 x daily - 7 x weekly - 2 sets - 10 reps  - Shoulder External Rotation and Scapular Retraction with Resistance  - 1 x daily - 7 x weekly - 2 sets - 10 reps  - Scaption with Dumbbells  - 1 x daily - 7 x weekly -  2 sets - 10 reps          ---      Flowsheet Row ---   Total Time    Timed Minutes 42 minutes   Total Time 42 minutes          Assessment   Patient has been seen for 10 physical therapu visits for neck pain and headaches.  Therapy emphasis has been on cervical mobility, cervical and postural stability, and body mechanics.  Patient reports reduction in frequency and intensity of headaches with ability to self correct posture.  Patient has made improvements however deficits still remain affecting ability to reach overhead, lifting from the floor, turning neck while driving, and maintaining proper sitting posture.  Skilled PT is warranted to address aforementioned impairments in order to return to PLOF.   Plan   Postural stability       Goals      Goal 1: Patient will demonstrate independence in prescribed HEP with proper form, sets and reps for safe discharge to an independent program.    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 and without compensation     02/16/22 KD: Pt supine chintuck hold and head lift performed for 24 seconds and pt reports that she can sit at her desk for about 30 minutes before discomfort or compensation occurs (in progress)    03/07/22  ESQ  Reports ability to sit for two hours before symptoms    Sessions: 16     Goal 4: Patient will demonstrate R shoulder flexion AROM of greater than or equal to 155 degrees to allow patient to lift objects into overhead cabinets  without pain and without compensation    02/16/22 KD: R shoulder flexion AROM 145 (in progress)    03/07/22 ESQ  Goal met    Sessions: 16                                                Thressa ShellerElise Keven Osborn, DPT

## 2022-03-14 ENCOUNTER — Inpatient Hospital Stay: Payer: BLUE CROSS/BLUE SHIELD | Attending: Neurological Surgery | Admitting: Physical Therapist

## 2022-03-14 DIAGNOSIS — G4486 Cervicogenic headache: Secondary | ICD-10-CM

## 2022-03-14 NOTE — PT/OT Therapy Note (Signed)
Name: Hailey RamaChristine Linda [name] Age: 41 y.o.   Date of Service: 03/14/2022  Referring Physician: Elder CyphersGorsen, Robert M, MD   Date of Injury: 12/15/2021  Date Care Plan Established/Reviewed: 01/18/2022  Date Treatment Started: 01/18/2022  Date Care Plan Established/Reviewed No data was found  Date Treatment Started No data was found  (Historic) Date Care Plan Established/Reviewed No data was found  (Historic) Date Treatment Started No data was found   End of Certification Date: 04/17/2022  Sessions in Plan of Care: 16  Surgery Date: No data was found  MD Follow-up: No data was found  Medbridge Code: No data was found    Visit Count: 11   Diagnosis:   1. Cervicogenic headache        Subjective     Daily Subjective   Patient reports things are feeling good;  yesterday migraine as well as Thursday afternoon with change in atmospheric pressure.  Did get a deep tissue massage 90min Saturday.     Social Support/Occupation    Lives in: multiple level home    Lives with: spouse    Occupation: Pensions consultantAttorney (desk)      Precautions: No data was found  Allergies: Fruit blend flavor [flavoring agent], Adhesive [wound dressing adhesive], Latex, Oxycodone, and Other    Objective                     Treatment     Therapeutic Exercises - Justified to address any of the following:  To develop strength, endurance, ROM and/or flexibility.   Subjective taken for planning of session      HEP updated and provided to patient     Neuromuscular Re-Education - Justified to address any of the following:   Re-education of movement, balance, coordination, kinesthetic sense, posture and/or proprioception for sitting and/or standing activities.   Supine chin tuck hold with head lift 10x5sec cuing for retraction maintenance 2 sounds     Serratus flexion x15 vs RTB cuing for eccentric control the straight     90/90 ER vs cuing to decrease UT compensation 2x10 B vs RTB    Rhythmic stabilization     Manual Therapy - Justified to address any of the following:     Mobilization of joints and soft tissues, manipulation, manual lymphatic drainage, and/or manual traction.    STM to B suboccipitals, cervical paraspinals     SOR    Therapeutic Activity - Justified to address the following:  Dynamic activities to improve functional performance.  Scapular clocks vs RTB x5 cuing for scapular protraction and neutral spine    Overhead ball taps 2x511min cuing for shoulder extension         Home Exercises   Access Code: [email or web address]  URL: [email or web address]  Date: 03/14/2022  Prepared by: Thressa ShellerElise Rutherford Alarie    Exercises  - Supine Deep Neck Flexor Training - Repetitions  - 1 x daily - 7 x weekly - 2 sets - 10 reps  - Seated Shoulder Rolls  - 1 x daily - 7 x weekly - 2 sets - 10 reps  - Seated Levator Scapulae Stretch  - 1 x daily - 7 x weekly - 1 sets - 3 reps - 30 hold  - Shoulder External Rotation and Scapular Retraction with Resistance  - 1 x daily - 7 x weekly - 2 sets - 10 reps  - Standing Shoulder Horizontal Abduction with Resistance  - 1 x daily - 7 x weekly - 2 sets - 10 reps  -  Shoulder Flexion Serratus Activation with Resistance  - 1 x daily - 7 x weekly - 2 sets - 10 reps  - Scaption with Dumbbells  - 1 x daily - 7 x weekly - 2 sets - 10 reps     ---      Flowsheet Row ---   Total Time    Timed Minutes 42 minutes   Total Time 42 minutes          Assessment   Patient tolerated therapy session well with decreased tissue tension noted to B UT and LS from previous sessions.  Patient provided with  updated HEP, verbalized understanding and intent to comply.  Patient initiated overhead stability exercises with cuing to decrease UT compensation with good carryover, fatigued quickly.  Plan   Progress CKC, overhead, and postural stability       Goals      Goal 1: Patient will demonstrate independence in prescribed HEP with proper form, sets and reps for safe discharge to an independent program.    02/16/22 KD: Pt was performing HEP consistently prior to being sick (in progress)      03/07/22 ES  Complaint with current program   Sessions: 16     Goal 2: Patient will demonstrate improvement in FOTO score from initial score of 56  to score of 66  to demonstrate functional improvement during course of care    02/16/22 KD: FOTO score 54/66 (In progress)    03/07/22 ESQ   Progressing 65     Sessions: 16     Goal 3: Patient will demonstrate supine chin tuck and lift for 30 seconds to demonstrate deep neck flexor strength to allow patient to sit for desk work for 3 hours without pain and without compensation     02/16/22 KD: Pt supine chintuck hold and head lift performed for 24 seconds and pt reports that she can sit at her desk for about 30 minutes before discomfort or compensation occurs (in progress)    03/07/22 ESQ  Reports ability to sit for two hours before symptoms    Sessions: 16     Goal 4: Patient will demonstrate R shoulder flexion AROM of greater than or equal to 155 degrees to allow patient to lift objects into overhead cabinets without pain and without compensation    02/16/22 KD: R shoulder flexion AROM 145 (in progress)    03/07/22 ESQ  Goal met    Sessions: 16                                                Thressa Sheller, DPT

## 2022-03-15 ENCOUNTER — Encounter (INDEPENDENT_AMBULATORY_CARE_PROVIDER_SITE_OTHER): Payer: Self-pay | Admitting: Internal Medicine

## 2022-03-15 ENCOUNTER — Telehealth: Payer: Self-pay

## 2022-03-15 ENCOUNTER — Encounter: Payer: Self-pay | Admitting: No Specialty

## 2022-03-15 ENCOUNTER — Ambulatory Visit: Payer: BLUE CROSS/BLUE SHIELD | Attending: No Specialty | Admitting: No Specialty

## 2022-03-15 VITALS — BP 139/96 | HR 75 | Temp 98.8°F | Resp 18 | Ht 68.0 in | Wt 314.0 lb

## 2022-03-15 DIAGNOSIS — G43709 Chronic migraine without aura, not intractable, without status migrainosus: Secondary | ICD-10-CM | POA: Insufficient documentation

## 2022-03-15 DIAGNOSIS — I493 Ventricular premature depolarization: Secondary | ICD-10-CM

## 2022-03-15 DIAGNOSIS — R42 Dizziness and giddiness: Secondary | ICD-10-CM | POA: Insufficient documentation

## 2022-03-15 DIAGNOSIS — M5090 Cervical disc disorder, unspecified, unspecified cervical region: Secondary | ICD-10-CM | POA: Insufficient documentation

## 2022-03-15 DIAGNOSIS — M352 Behcet's disease: Secondary | ICD-10-CM | POA: Insufficient documentation

## 2022-03-15 MED ORDER — BOTOX 200 UNITS IJ SOLR
INTRAMUSCULAR | 3 refills | Status: DC
Start: 2022-03-15 — End: 2023-02-21

## 2022-03-15 NOTE — Telephone Encounter (Signed)
submitted PA via CMM (Key: [id number]). If [employer] has not responded to your request within 24 hours, contact [employer] at [phone].

## 2022-03-15 NOTE — Telephone Encounter (Signed)
Requesting prior authorization for Botox 200 units     PA Outcome APPROVED    Quantity of 200 for a day supply of 84     Insurance BCBS FEP    Reference #? [id number]    Dates in effect, from 02/13/2022 through 03/15/2023    Pharmacy and phone number pending patient decision    $185 copay, eligible for savings card and to fill with [facility]    Will reach out to patient

## 2022-03-15 NOTE — Progress Notes (Signed)
03/15/2022      CC: migraine    History was obtained from patient   History of Present Illness:  Hailey [name] is a 41 y.o. 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     Headache hx: since college  Location:bilateral and band like and left eye  Described as sharp and throbbing  Occurs:>15 HA days per month  Duration:>4 hours  Intensity: moderate to severe  Aura:none  Associated with photo/phonophobia, nausea and dizziness  Denies focal motor/sensory or visual changes.   No positional or autonomic symptoms  Exacerbating factors:unknown  Alleviating factors:unknown  Sleep:OK  Mood:high, working in coping mechanisms    Past medications tried:  Wellsite geologist  Rizatriptan  imitrex  buspar,   Topiramate: SE  reglan,   Propranolol ineffective  hydroxyzine        HIT6:62  Records Reviewed:  Dr.Dave notes for headache      Past Medical History:  Past Medical History:   Diagnosis Date    Abnormal Pap smear of cervix     Anemia ~12    Pre-hysterectomy    Behcet's disease     BMI 45.0-49.9, adult     Breast lump     Diarrhea     occasional    Fever     for past ~ 3 months (avg daily fever 100-101)    Gastroesophageal reflux disease     Headache     Herpes simplex virus (HSV) infection     Lower back pain     Meningitis     Neck pain     Post-operative nausea and vomiting     Scoliosis     Swollen lymph nodes        Past Surgical History:  Past Surgical History:   Procedure Laterality Date    ablasion  2005    ARTHROTOMY, WRIST Right 12/03/2020    Procedure: RIGHT UPPER EXTREMITY SYNOVIAL BIOPSY;  Surgeon: Kellie Simmering, MD;  Location: [facility];  Service: Plastics;  Laterality: Right;    BIOPSY, LYMPH NODE N/A 07/23/2018    Procedure: BIOPSY, LYMPH NODE;  Surgeon: Shelda Pal, MD;  Location: [facility];  Service: ENT;  Laterality: N/A;  NECK DEEP LYMPH NODE BIOPSY    BONE MARROW BIOPSY  2020    CHOLECYSTECTOMY  2009    D&C DIAGNOSTIC  2010    DILATION AND  CURETTAGE OF UTERUS  2010    FINGER GANGLION CYST EXCISION  04/2018    GANGLION CYST EXCISION  2014    04/2018 cyst removed from index finger-[facility]    HERNIA REPAIR  2016    HYSTERECTOMY  2012    PELVIC LAPAROSCOPY  2011    UMBILICAL HERNIA REPAIR  2016    UPPER GASTROINTESTINAL ENDOSCOPY  2009    WISDOM TOOTH EXTRACTION  1998       Allergies:  Fruit blend flavor [flavoring agent], Adhesive [wound dressing adhesive], Latex, Oxycodone, and Other    Family History:  Family History   Adopted: Yes     No other family history related to current complaints.    Social History:  Social History     Tobacco Use    Smoking status: Never    Smokeless tobacco: Never   Vaping Use    Vaping status: Never Used   Substance Use Topics    Alcohol use: Not Currently     Alcohol/week: 0.0 standard drinks of alcohol  Comment: rarely    Drug use: Never       Medications:  Current Outpatient Medications   Medication Sig Dispense Refill    Adalimumab 40 MG/0.4ML Pen-injector Kit Inject 40 mg into the skin every 10 (ten) days 6 each 1    Azelastine-Fluticasone 137-50 MCG/ACT Suspension 2 (two) times daily as needed      busPIRone (BUSPAR) 5 MG tablet TAKE 1 TABLET (5 MG) BY MOUTH 3 (THREE) TIMES DAILY AS NEEDED (ANXIETY) 270 tablet 0    clindamycin (CLEOCIN T) 1 % lotion APPLY TWICE A DAY TO BREAKOUTS ON FACE, WASH WITH OVER THE COUNTER BENZOYL PEROXIDE BEFORE HAND      colesevelam (WELCHOL) 625 MG tablet Take 1 tablet (625 mg) by mouth 2 (two) times daily with meals 180 tablet 3    Dapsone 7.5 % Gel APPLY DAILY TO ACNE/FOLLICULITIS ON FACE      EPINEPHrine (EPIPEN 2-PAK) 0.3 MG/0.3ML Solution Auto-injector injection Inject 0.3 mLs (0.3 mg) into the muscle once as needed (Anaphylaxis) ; go to ER after injection (Patient not taking: Reported on 01/14/2022) 1 each 1    fexofenadine (Allegra Allergy) 180 MG tablet       fluocinonide (LIDEX) 0.05 % gel APPLY TO AFFECTED SORES ON SKIN OR R MOUTH TWICE DAILY UNTIL RESOLVED.       hydrOXYzine (ATARAX) 10 MG tablet Take 1 tablet (10 mg) by mouth nightly 90 tablet 3    lidocaine-prilocaine (EMLA) cream  (Patient not taking: Reported on 03/02/2022)      Multiple Vitamins-Minerals (MULTIVITAMIN ADULT PO) Take by mouth daily         Omega-3 1000 MG Cap Take by mouth      ondansetron (ZOFRAN-ODT) 4 MG disintegrating tablet Take 1 tablet (4 mg) by mouth every 8 (eight) hours as needed for Nausea 30 tablet 0    Otezla 30 MG Tab TAKE 1 TABLET BY MOUTH 2 TIMES A DAY. 60 tablet 2    Rimegepant Sulfate 75 MG Tablet Dispersible Take 1 tablet (75 mg) by mouth daily as needed (headache) 30 tablet 11    rizatriptan (MAXALT) 10 MG tablet Take 1-2 tablets (10-20 mg total) by mouth once as needed for Migraine Max 3 days a week 36 tablet 3    vitamin D (CHOLECALCIFEROL) 25 MCG (1000 UT) tablet Take 1,000 Units by mouth daily       No current facility-administered medications for this visit.       General Exam:  BP (!) 139/96 (BP Site: Left arm, Patient Position: Sitting, Cuff Size: Large)   Pulse 75   Temp 98.8 F (37.1 C) (Oral)   Resp 18   Ht 1.727 m (5\' 8" )   Wt 142.4 kg (314 lb)   LMP  (LMP Unknown)   SpO2 96%   BMI 47.74 kg/m   Respiratory rate wnl.   Gen:  Well-developed, well-nourished.  No acute distress.  Cooperative with exam.  HEENT:  NC/AT. No icterus or conjunctival hemorrhage noted.  Neck: Full range of motion.    Lungs: No audible wheezing or dyspnea at rest.   Skin:  No obvious rashes or lesions.   Extrem:  No obvious edema noted in hands.     NEUROLOGICAL EXAM    Mental Status: Awake, alert, oriented. Speech fluent without dysarthria. Follows commands well. Attention, memory, and concentration intact. Fund of knowledge appropriate for education.     Cranial Nerves: VFFTC.  Extraocular movements are full. No nystagmus seen. Facial sensation to  light touch appears intact. Face is symmetric. Hearing is intact to conversational speech. Cranial nerves II-XII otherwise appears intact.      Motor: Moves all extremities well    Coordination: Finger to nose intact without dysmetria. No truncal ataxia.    Gait: Normal and steady.      Investigations:      Imaging:  MRI Brain W WO Contrast    Result Date: 04/30/2021  Impression:  1. No intracranial abnormality is detected. MR imaging of the brain is stable with 08/13/2020. 2. There is no lesion within the spinal cord. 3. Mild cervical, thoracic and lumbar spondylosis as discussed above. Electronically signed by: Otho Ket D.O.  [Interpreted at: [facility] BG: 04/30/21    MRI Cervical Spine W WO Contrast    Result Date: 04/30/2021  Impression:  1. No intracranial abnormality is detected. MR imaging of the brain is stable with 08/13/2020. 2. There is no lesion within the spinal cord. 3. Mild cervical, thoracic and lumbar spondylosis as discussed above. Electronically signed by: Otho Ket D.O.  [Interpreted at: [facility] BG: 04/30/21    MRI Lumbar Spine W WO Contrast    Result Date: 04/30/2021  Impression:  1. No intracranial abnormality is detected. MR imaging of the brain is stable with 08/13/2020. 2. There is no lesion within the spinal cord. 3. Mild cervical, thoracic and lumbar spondylosis as discussed above. Electronically signed by: Otho Ket D.O.  [Interpreted at: [facility] BG: 04/30/21    MRI Thoracic Spine W WO Contrast    Result Date: 04/30/2021  Impression:  1. No intracranial abnormality is detected. MR imaging of the brain is stable with 08/13/2020. 2. There is no lesion within the spinal cord. 3. Mild cervical, thoracic and lumbar spondylosis as discussed above. Electronically signed by: Otho Ket D.O.  [Interpreted at: [facility] BG: 04/30/21       Recommendations:  1. 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 Behcet's disease    3. Cervical disc disease    4.  Dizziness    Hailey [name] is a 41 y.o. 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   MRI brain shows no acute pathology    Plan:   Patient meets the criteria for chronic migraine.  She has headaches >15 days per month and at least 8 of these are migrainous.  Migraines last >4 hours if untreated.  This pattern has continued for >3 months.  She has failed at least two preventive medications.  I therefore recommended a trial of Botox via the PREEMPT protocol for migraine prophylaxis.      Continue Nurtec 75mg  every other day    Continue rizatriptan PRN abortive    45  minutes were spent on the day of service including face to face time with patient, coordinating care, record review, and documentation.       Referring (see communications section)      The patient should seek emergent care if there is any change in the symptoms. Proper use and all side effects of medications discussed    Discussed the importance of sleep hygiene, maintaining appropriate hydration, avoid overuse of caffeine and OTC medications for headache lifestyle modification      Please contact me with any questions. Patients and [facility] Providers can reach me via MyChart.    Fraser Din, MD  [facility]  [place]: [phone]  [place]: [phone]    *DISCLAIMER: This note was generated by the Epic EMR system/ Dragon speech recognition and may contain inherent errors or omissions not intended by the user. Grammatical errors, random word insertions, deletions, pronoun errors and incomplete sentences are occasional consequences of this technology due to software limitations. Not all errors are caught or corrected. If there are questions or concerns about the content of this note or information contained within the body of this dictation they should be addressed directly with the author for clarification.*

## 2022-03-15 NOTE — Patient Instructions (Signed)
Our plan:     Continue current medications    Start Botox        Today's Visit:      In today's visit, I reviewed your medications and records relating your health - prior testing, blood work, reports of other health care providers present in your electronic medical record.     If you have pertinent records from any non-[facility] doctors that you would like to review, please have them sent to [place] or bring to them to your next office visit.     A copy of today's visit will be sent to your referring doctor and/or primary care doctor, if you have one listed in our system.    Let me know if there are things we could have done better for your office visit.    Patient satisfaction survey:      If you receive a patient satisfaction survey, I would greatly appreciate it if you would complete it. We value your feedback.     Contact me online:      Patient Portal online - Please sign up for MyChart -- this is the best way to communicate with your team here.  There is a messaging feature, where you can [place] messages anytime of day.  It is the best way to communicate with [place] and get test results, medication refills, or ask questions.     You can expect to get a response within 24-48 hours during weekdays.  If you do not receive a timely response, resend your request and inform us. My goal is for every question answered ever day.  Average response for a phone call maybe 3 days due to the volume we receive (which is why MyChart is preferred).    If you are having a medical emergency -- call 911, DO NOT SEND A MESSAGE THROUGH MYCHART.     Coupons for medication:      If you have any trouble affording your medications, check out www.[email or web address] for coupons and competitive prices in your area.  If you need further assistance, let us know so we can work with you and your insurance to make sure you get the best care.    Thank you for trusting me with your health.      Take care,  Fraser Din, MD  [facility]   [facility]: [phone]  [place]: [phone]

## 2022-03-15 NOTE — Telephone Encounter (Signed)
Request received and beginning investigation     Botox 200 units    PA Required    Initiated via CMM (Key: [id number]). Will finalize submission once office notes are finished and diagnosis code added

## 2022-03-16 LAB — ECHOCARDIOGRAM ADULT COMPLETE W CLR/ DOPP WAVEFORM
AV Area (Cont Eq VTI): 1.82
AV Area (Cont Eq VTI): 2.539
AV Mean Gradient: 5
AV Peak Velocity: 148
Ao Root Diameter (2D): 3
BP Mod LV Ejection Fraction: 51.6
IVS Diastolic Thickness (2D): 0.894
LA Dimension (2D): 4.8
LA Volume Index (BP A-L): 0.031
LVID diastole (2D): 5.54
LVID systole (2D): 4.05
MV Area (PHT): 5.537
MV E/A: 1.8
MV E/A: 1.849
MV E/e' (Average): 8.049
Prox Ascending Aorta Diameter: 3
Pulmonary Valve Findings: NORMAL
RV Basal Diastolic Dimension: 3.23
RV Function: NORMAL
RV Systolic Pressure: 27
RV Systolic Pressure: 28
Site RA Size (AS): NORMAL
Site RV Size (AS): NORMAL
TAPSE: 2.03
Tricuspid Valve Findings: NORMAL

## 2022-03-17 ENCOUNTER — Inpatient Hospital Stay: Payer: BLUE CROSS/BLUE SHIELD | Attending: Neurological Surgery | Admitting: Physical Therapist

## 2022-03-17 DIAGNOSIS — G4486 Cervicogenic headache: Secondary | ICD-10-CM | POA: Insufficient documentation

## 2022-03-17 NOTE — PT/OT Therapy Note (Signed)
Name: Hailey [name] Age: 41 y.o.   Date of Service: 03/17/2022  Referring Physician: Elder CyphersGorsen, Robert M, MD   Date of Injury: 12/15/2021  Date Care Plan Established/Reviewed: 01/18/2022  Date Treatment Started: 01/18/2022  Date Care Plan Established/Reviewed No data was found  Date Treatment Started No data was found  (Historic) Date Care Plan Established/Reviewed No data was found  (Historic) Date Treatment Started No data was found   End of Certification Date: 04/17/2022  Sessions in Plan of Care: 16  Surgery Date: No data was found  MD Follow-up: No data was found  Medbridge Code: No data was found    Visit Count: 12   Diagnosis:   1. Cervicogenic headache        Subjective     Daily Subjective   Patient states seeing migraine neurologist earlier in the week; botox starting next week and continue PT trial. Reports having extreme fatigue from last day of medication Humera for immune system.     Social Support/Occupation    Lives in: multiple level home    Lives with: spouse    Occupation: Pensions consultantAttorney (desk)      Precautions: No data was found  Allergies: Fruit blend flavor [flavoring agent], Adhesive [wound dressing adhesive], Latex, Oxycodone, and Other    Objective                     Treatment     Therapeutic Exercises - Justified to address any of the following:  To develop strength, endurance, ROM and/or flexibility.   Subjective taken for planning of session     Stretching of B LS 2x60sec by therapist     Thoracic rotation x15 B vs GTB cuing for AROM -     #4 bar shoulder flexion in sitting x20 cuing to decrease UT compensation      Neuromuscular Re-Education - Justified to address any of the following:   Re-education of movement, balance, coordination, kinesthetic sense, posture and/or proprioception for sitting and/or standing activities.   Supine chin tuck hold with head lift 10x5sec cuing for retraction maintenance 2 rounds    Serratus punch #5 2x10 cuing for eccentric control     Supine horizontal  shoulder abd with cervical rotation vs GTB x10 B       Manual Therapy - Justified to address any of the following:    Mobilization of joints and soft tissues, manipulation, manual lymphatic drainage, and/or manual traction.    STM to B suboccipitals, cervical paraspinals, B LS and UT     SOR    Therapeutic Activity - Justified to address the following:  Dynamic activities to improve functional performance.          Home Exercises   Access Code: [email or web address]  URL: [email or web address]  Date: 03/14/2022  Prepared by: Thressa ShellerElise Geza Beranek    Exercises  - Supine Deep Neck Flexor Training - Repetitions  - 1 x daily - 7 x weekly - 2 sets - 10 reps  - Seated Shoulder Rolls  - 1 x daily - 7 x weekly - 2 sets - 10 reps  - Seated Levator Scapulae Stretch  - 1 x daily - 7 x weekly - 1 sets - 3 reps - 30 hold  - Shoulder External Rotation and Scapular Retraction with Resistance  - 1 x daily - 7 x weekly - 2 sets - 10 reps  - Standing Shoulder Horizontal Abduction with Resistance  - 1 x daily - 7 x weekly -  2 sets - 10 reps  - Shoulder Flexion Serratus Activation with Resistance  - 1 x daily - 7 x weekly - 2 sets - 10 reps  - Scaption with Dumbbells  - 1 x daily - 7 x weekly - 2 sets - 10 reps       ---      Flowsheet Row ---   Total Time    Timed Minutes 40 minutes   Total Time 40 minutes          Assessment   Patient reports extreme full body fatigue upon arrival to therapy session secondary to last day of immune medication.  Therapy session was modified with appropriate rest breaks taken and exercises performed as tolerated.  Patient will reduce POC frequency to 1x/week due to continued progress and other medical interventions at this time.  Plan   Progress CKC, overhead, and postural stability       Goals      Goal 1: Patient will demonstrate independence in prescribed HEP with proper form, sets and reps for safe discharge to an independent program.    02/16/22 KD: Pt was performing HEP consistently prior to being sick (in  progress)     03/07/22 ES  Complaint with current program   Sessions: 16     Goal 2: Patient will demonstrate improvement in FOTO score from initial score of 56  to score of 66  to demonstrate functional improvement during course of care    02/16/22 KD: FOTO score 54/66 (In progress)    03/07/22 ESQ   Progressing 65     Sessions: 16     Goal 3: Patient will demonstrate supine chin tuck and lift for 30 seconds to demonstrate deep neck flexor strength to allow patient to sit for desk work for 3 hours without pain and without compensation     02/16/22 KD: Pt supine chintuck hold and head lift performed for 24 seconds and pt reports that she can sit at her desk for about 30 minutes before discomfort or compensation occurs (in progress)    03/07/22 ESQ  Reports ability to sit for two hours before symptoms    Sessions: 16     Goal 4: Patient will demonstrate R shoulder flexion AROM of greater than or equal to 155 degrees to allow patient to lift objects into overhead cabinets without pain and without compensation    02/16/22 KD: R shoulder flexion AROM 145 (in progress)    03/07/22 ESQ  Goal met    Sessions: 16                                                Thressa Sheller, DPT

## 2022-03-18 ENCOUNTER — Other Ambulatory Visit (INDEPENDENT_AMBULATORY_CARE_PROVIDER_SITE_OTHER): Payer: Self-pay | Admitting: Obstetrics and Gynecology

## 2022-03-18 ENCOUNTER — Telehealth (INDEPENDENT_AMBULATORY_CARE_PROVIDER_SITE_OTHER): Payer: Self-pay

## 2022-03-18 NOTE — Progress Notes (Signed)
Spoke to pt re: sono result.  Repeat next year

## 2022-03-18 NOTE — Telephone Encounter (Signed)
-----   Message from Tod Persia, MD sent at 03/17/2022 12:33 PM EDT -----  Mildly reduced EF at 52% (normal 55%).  Await monitor results. mw  ----- Message -----  From: Interface, Rad Results In  Sent: 03/16/2022  11:54 PM EDT  To: Tod Persia, MD

## 2022-03-18 NOTE — Telephone Encounter (Signed)
Monitor in your bin for review. FYI, printed double sided! Next OV 06/02/22    * 1 supraventricular episodes were found. Longest SVT Episode 4 beats  Overall PVC Burden at 40.04 %  Overall PSVC Burden at < 0.01 %  There is a total of 0 patient events.

## 2022-03-19 ENCOUNTER — Other Ambulatory Visit (INDEPENDENT_AMBULATORY_CARE_PROVIDER_SITE_OTHER): Payer: Self-pay | Admitting: Family Medicine

## 2022-03-19 DIAGNOSIS — F419 Anxiety disorder, unspecified: Secondary | ICD-10-CM

## 2022-03-21 ENCOUNTER — Other Ambulatory Visit (INDEPENDENT_AMBULATORY_CARE_PROVIDER_SITE_OTHER): Payer: Self-pay | Admitting: Clinical Cardiac Electrophysiology

## 2022-03-21 ENCOUNTER — Inpatient Hospital Stay: Payer: BLUE CROSS/BLUE SHIELD | Admitting: Physical Therapist

## 2022-03-21 ENCOUNTER — Encounter (INDEPENDENT_AMBULATORY_CARE_PROVIDER_SITE_OTHER): Payer: Self-pay

## 2022-03-21 ENCOUNTER — Telehealth (INDEPENDENT_AMBULATORY_CARE_PROVIDER_SITE_OTHER): Payer: Self-pay

## 2022-03-21 DIAGNOSIS — I493 Ventricular premature depolarization: Secondary | ICD-10-CM

## 2022-03-21 NOTE — Telephone Encounter (Signed)
Per your note, patient made aware of monitor results and your suggestion of cardiac MRI for 40% PVC's

## 2022-03-21 NOTE — Telephone Encounter (Signed)
Per Dr Lujean Amel, Cardiac MRI ,PVC burden 40% / Patient aware.

## 2022-03-23 ENCOUNTER — Encounter: Payer: Self-pay | Admitting: No Specialty

## 2022-03-23 ENCOUNTER — Ambulatory Visit: Payer: BLUE CROSS/BLUE SHIELD | Attending: No Specialty | Admitting: No Specialty

## 2022-03-23 ENCOUNTER — Inpatient Hospital Stay: Payer: BLUE CROSS/BLUE SHIELD | Admitting: Physical Therapist

## 2022-03-23 VITALS — BP 134/80 | HR 48 | Temp 97.6°F | Resp 16 | Wt 312.2 lb

## 2022-03-23 DIAGNOSIS — G43109 Migraine with aura, not intractable, without status migrainosus: Secondary | ICD-10-CM

## 2022-03-23 DIAGNOSIS — M47896 Other spondylosis, lumbar region: Secondary | ICD-10-CM | POA: Insufficient documentation

## 2022-03-23 DIAGNOSIS — I456 Pre-excitation syndrome: Secondary | ICD-10-CM | POA: Insufficient documentation

## 2022-03-23 DIAGNOSIS — M47892 Other spondylosis, cervical region: Secondary | ICD-10-CM | POA: Insufficient documentation

## 2022-03-23 DIAGNOSIS — M47894 Other spondylosis, thoracic region: Secondary | ICD-10-CM | POA: Insufficient documentation

## 2022-03-23 DIAGNOSIS — M352 Behcet's disease: Secondary | ICD-10-CM | POA: Insufficient documentation

## 2022-03-23 DIAGNOSIS — G43709 Chronic migraine without aura, not intractable, without status migrainosus: Secondary | ICD-10-CM | POA: Insufficient documentation

## 2022-03-23 MED ORDER — ONABOTULINUMTOXINA 200 UNITS IJ SOLR
155.0000 [IU] | Freq: Once | INTRAMUSCULAR | Status: AC
Start: 2022-03-23 — End: 2022-03-23
  Administered 2022-03-23: 155 [IU] via INTRAMUSCULAR

## 2022-03-23 NOTE — Progress Notes (Signed)
Botox 200 units witness by DR.Kastl Botox 200 units mixed with 4 ml N/S  draw in  each 1 ml syringe and 30 G. Needle order read back and verified to DR. KASTL

## 2022-03-23 NOTE — Patient Instructions (Signed)
Our plan:     Continue current medications        Today's Visit:      In today's visit, I reviewed your medications and records relating your health - prior testing, blood work, reports of other health care providers present in your electronic medical record.     If you have pertinent records from any non-[facility] doctors that you would like to review, please have them sent to us or bring to them to your next office visit.     A copy of today's visit will be sent to your referring doctor and/or primary care doctor, if you have one listed in our system.    Let me know if there are things we could have done better for your office visit.    Patient satisfaction survey:      If you receive a patient satisfaction survey, I would greatly appreciate it if you would complete it. We value your feedback.     Contact me online:      Patient Portal online - Please sign up for MyChart -- this is the best way to communicate with your team here.  There is a messaging feature, where you can us messages anytime of day.  It is the best way to communicate with us and get test results, medication refills, or ask questions.     You can expect to get a response within 24-48 hours during weekdays.  If you do not receive a timely response, resend your request and inform us. My goal is for every question answered ever day.  Average response for a phone call maybe 3 days due to the volume we receive (which is why MyChart is preferred).    If you are having a medical emergency -- call 911, DO NOT SEND A MESSAGE THROUGH MYCHART.     Coupons for medication:      If you have any trouble affording your medications, check out [email or web address] for coupons and competitive prices in your area.  If you need further assistance, let us know so we can work with you and your insurance to make sure you get the best care.    Thank you for trusting me with your health.      Take care,  Lori Popowski, MD  [facility]   ICPH: [phone]  : [phone]

## 2022-03-23 NOTE — Progress Notes (Signed)
03/23/2022      CC: migraine    History was obtained from patient   History of Present Illness:  Hailey [name] is a 41 y.o. 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     Mar 23, 2022  She presents for Botox injections. No other issues or conerns.     Notes reviewed: Rheumatology message re: pathergy and Botox injections    Mar 15 2022  Headache hx: since college  Location:bilateral and band like and left eye  Described as sharp and throbbing  Occurs:>15 HA days per month  Duration:>4 hours  Intensity: moderate to severe  Aura:none  Associated with photo/phonophobia, nausea and dizziness  Denies focal motor/sensory or visual changes.   No positional or autonomic symptoms  Exacerbating factors:unknown  Alleviating factors:unknown  Sleep:OK  Mood:high, working in coping mechanisms    Past medications tried:  Wellsite geologisturtec  Emgality  Rizatriptan  imitrex  buspar,   Topiramate: SE  reglan,   Propranolol ineffective  hydroxyzine        HIT6:62  Records Reviewed:  Dr.Dave notes for headache      Past Medical History:  Past Medical History:   Diagnosis Date    Abnormal Pap smear of cervix     Anemia ~12    Pre-hysterectomy    Behcet's disease     BMI 45.0-49.9, adult     Breast lump     Diarrhea     occasional    Fever     for past ~ 3 months (avg daily fever 100-101)    Gastroesophageal reflux disease     Headache     Herpes simplex virus (HSV) infection     Lower back pain     Meningitis     Neck pain     Post-operative nausea and vomiting     Scoliosis     Swollen lymph nodes        Past Surgical History:  Past Surgical History:   Procedure Laterality Date    ablasion  2005    ARTHROTOMY, WRIST Right 12/03/2020    Procedure: RIGHT UPPER EXTREMITY SYNOVIAL BIOPSY;  Surgeon: Kellie SimmeringMehan, Vineet, MD;  Location: [facility];  Service: Plastics;  Laterality: Right;    BIOPSY, LYMPH NODE N/A 07/23/2018    Procedure: BIOPSY, LYMPH NODE;  Surgeon: Shelda Palreyfuss, Heath F, MD;  Location: [facility];  Service: ENT;  Laterality: N/A;  NECK DEEP LYMPH NODE BIOPSY    BONE MARROW BIOPSY  2020    CHOLECYSTECTOMY  2009    D&C DIAGNOSTIC  2010    DILATION AND CURETTAGE OF UTERUS  2010    FINGER GANGLION CYST EXCISION  04/2018    GANGLION CYST EXCISION  2014    04/2018 cyst removed from index finger-[facility]    HERNIA REPAIR  2016    HYSTERECTOMY  2012    PELVIC LAPAROSCOPY  2011    UMBILICAL HERNIA REPAIR  2016    UPPER GASTROINTESTINAL ENDOSCOPY  2009    WISDOM TOOTH EXTRACTION  1998       Allergies:  Fruit blend flavor [flavoring agent], Adhesive [wound dressing adhesive], Latex, Oxycodone, and Other    Family History:  Family History   Adopted: Yes     No other family history related to current complaints.    Social History:  Social History     Tobacco Use    Smoking status: Never    Smokeless tobacco:  Never   Vaping Use    Vaping status: Never Used   Substance Use Topics    Alcohol use: Not Currently     Alcohol/week: 0.0 standard drinks of alcohol     Comment: rarely    Drug use: Never       Medications:  Current Outpatient Medications   Medication Sig Dispense Refill    Adalimumab 40 MG/0.4ML Pen-injector Kit Inject 40 mg into the skin every 10 (ten) days 6 each 1    Azelastine-Fluticasone 137-50 MCG/ACT Suspension 2 (two) times daily as needed      botulinum Toxin Type A (Botox) 200 units Recon Soln injection Inject 155 units IM every 12 weeks 1 each 3    busPIRone (BUSPAR) 5 MG tablet TAKE 1 TABLET (5 MG) BY MOUTH 3 (THREE) TIMES DAILY AS NEEDED (ANXIETY) 270 tablet 0    clindamycin (CLEOCIN T) 1 % lotion APPLY TWICE A DAY TO BREAKOUTS ON FACE, WASH WITH OVER THE COUNTER BENZOYL PEROXIDE BEFORE HAND      colesevelam (WELCHOL) 625 MG tablet Take 1 tablet (625 mg) by mouth 2 (two) times daily with meals 180 tablet 3    Dapsone 7.5 % Gel APPLY DAILY TO ACNE/FOLLICULITIS ON FACE      EPINEPHrine (EPIPEN 2-PAK) 0.3 MG/0.3ML Solution Auto-injector injection Inject 0.3 mLs (0.3 mg) into the  muscle once as needed (Anaphylaxis) ; go to ER after injection 1 each 1    fexofenadine (Allegra Allergy) 180 MG tablet       fluocinonide (LIDEX) 0.05 % gel APPLY TO AFFECTED SORES ON SKIN OR R MOUTH TWICE DAILY UNTIL RESOLVED.      hydrOXYzine (ATARAX) 10 MG tablet Take 1 tablet (10 mg) by mouth nightly 90 tablet 3    lidocaine-prilocaine (EMLA) cream       Multiple Vitamins-Minerals (MULTIVITAMIN ADULT PO) Take by mouth daily         Omega-3 1000 MG Cap Take by mouth      ondansetron (ZOFRAN-ODT) 4 MG disintegrating tablet Take 1 tablet (4 mg) by mouth every 8 (eight) hours as needed for Nausea 30 tablet 0    Otezla 30 MG Tab TAKE 1 TABLET BY MOUTH 2 TIMES A DAY. 60 tablet 2    Rimegepant Sulfate 75 MG Tablet Dispersible Take 1 tablet (75 mg) by mouth daily as needed (headache) 30 tablet 11    rizatriptan (MAXALT) 10 MG tablet Take 1-2 tablets (10-20 mg total) by mouth once as needed for Migraine Max 3 days a week 36 tablet 3    vitamin D (CHOLECALCIFEROL) 25 MCG (1000 UT) tablet Take 1,000 Units by mouth daily       No current facility-administered medications for this visit.       General Exam:  BP 134/80 (BP Site: Left arm, Patient Position: Sitting, Cuff Size: Large)   Pulse (!) 48   Temp 97.6 F (36.4 C) (Oral)   Resp 16   Wt 141.6 kg (312 lb 3.2 oz)   LMP  (LMP Unknown)   SpO2 97%   BMI 47.47 kg/m   Respiratory rate wnl.   Gen:  Well-developed, well-nourished.  No acute distress.  Cooperative with exam.  HEENT:  NC/AT. No icterus or conjunctival hemorrhage noted.  Neck: Full range of motion.    Lungs: No audible wheezing or dyspnea at rest.   Skin:  No obvious rashes or lesions.   Extrem:  No obvious edema noted in hands.     NEUROLOGICAL EXAM  Mental Status: Awake, alert, oriented. Speech fluent without dysarthria. Follows commands well. Attention, memory, and concentration intact. Fund of knowledge appropriate for education.     Cranial Nerves:   Extraocular movements are full. No nystagmus  seen. Facial sensation to light touch appears intact. Face is symmetric. Hearing is intact to conversational speech. Cranial nerves II-XII otherwise appears intact.     Motor: Moves all extremities well    Coordination:  No truncal ataxia.    Gait: Normal and steady.      Investigations:      Imaging:  MRI Brain W WO Contrast    Result Date: 04/30/2021  Impression:  1. No intracranial abnormality is detected. MR imaging of the brain is stable with 08/13/2020. 2. There is no lesion within the spinal cord. 3. Mild cervical, thoracic and lumbar spondylosis as discussed above. Electronically signed by: Otho Ket D.O.  [Interpreted at: [facility] BG: 04/30/21    MRI Cervical Spine W WO Contrast    Result Date: 04/30/2021  Impression:  1. No intracranial abnormality is detected. MR imaging of the brain is stable with 08/13/2020. 2. There is no lesion within the spinal cord. 3. Mild cervical, thoracic and lumbar spondylosis as discussed above. Electronically signed by: Otho Ket D.O.  [Interpreted at: [facility] BG: 04/30/21    MRI Lumbar Spine W WO Contrast    Result Date: 04/30/2021  Impression:  1. No intracranial abnormality is detected. MR imaging of the brain is stable with 08/13/2020. 2. There is no lesion within the spinal cord. 3. Mild cervical, thoracic and lumbar spondylosis as discussed above. Electronically signed by: Otho Ket D.O.  [Interpreted at: [facility] BG: 04/30/21    MRI Thoracic Spine W WO Contrast    Result Date: 04/30/2021  Impression:  1. No intracranial abnormality is detected. MR imaging of the brain is stable with 08/13/2020. 2. There is no lesion within the spinal cord. 3. Mild cervical, thoracic and lumbar spondylosis as discussed above. Electronically signed by: Otho Ket D.O.  [Interpreted at: [facility] BG: 04/30/21          Procedure:  Chemodeneveration.   Indication: Chronic migraine     Discussed Botox procedure, answered questions, consent is in the chart.    One  vial of 200 units of Botox was diluted,  with 4  mL of 0.9% NS. Using a 30 gauge half-inch needle, the following muscles were injected with the following doses:   Bilateral corrugator: 10 units divided in 2 sites  Procerus: 5 units in 1 site  Frontalis: 20 units divided in 4 sites  Temporalis: 40 units divided in 8 sites   Occipitalis: 30 units divided in 6 sites  Cervical paraspinals: 20 units divided in 4 sites  Trapezius: 30 units divided in 6 sites   A total of 155 units of botulinum toxin A was administered, divided in 31 sites.   There was 45 units waste.   The patient tolerated procedure well and No other adverse effects immediately noted.  LOT [id number]  Exp 12/2024    Fraser Din, MD  [facility]        Recommendations:  1. 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 Behcet's disease    3. Migraine with aura and without status migrainosus, not intractable      Syliva Mee is a 41 y.o. female with Behcet's  and seronegative arthritis and WPW who is seen for chronic migraine.     Tolerated Botox well today     Plan:   Repeat Botox in 12 weeks    Continue Nurtec 75mg  every other day    Continue rizatriptan PRN abortive          Referring (see communications section)      The patient should seek emergent care if there is any change in the symptoms. Proper use and all side effects of medications discussed    Discussed the importance of sleep hygiene, maintaining appropriate hydration, avoid overuse of caffeine and OTC medications for headache lifestyle modification      Please contact me with any questions. Patients and [facility] Providers can reach me via MyChart.    , MD  [facility]  [facility]: [phone]  [phone]: [phone]    *DISCLAIMER: This note was generated by the  [employer] EMR system/ Dragon speech recognition and may contain inherent errors or omissions not intended by the user. Grammatical errors, random word insertions, deletions, pronoun errors and incomplete sentences are occasional consequences of this technology due to software limitations. Not all errors are caught or corrected. If there are questions or concerns about the content of this note or information contained within the body of this dictation they should be addressed directly with the author for clarification.*

## 2022-03-24 NOTE — Progress Notes (Signed)
03/02/22 3D monitor end report reviewed by MW. End report scanned, billed, and placed in nursing bin for results call back.

## 2022-03-28 ENCOUNTER — Inpatient Hospital Stay: Payer: BLUE CROSS/BLUE SHIELD | Attending: Neurological Surgery | Admitting: Physical Therapist

## 2022-03-28 DIAGNOSIS — G4486 Cervicogenic headache: Secondary | ICD-10-CM | POA: Insufficient documentation

## 2022-03-28 NOTE — PT/OT Therapy Note (Signed)
Name: Aleida Crandell Age: 41 y.o.   Date of Service: 03/28/2022  Referring Physician: Elder Cyphers, MD   Date of Injury: 12/15/2021  Date Care Plan Established/Reviewed: 01/18/2022  Date Treatment Started: 01/18/2022  Date Care Plan Established/Reviewed No data was found  Date Treatment Started No data was found  (Historic) Date Care Plan Established/Reviewed No data was found  (Historic) Date Treatment Started No data was found   End of Certification Date: 04/17/2022  Sessions in Plan of Care: 16  Surgery Date: No data was found  MD Follow-up: No data was found  Medbridge Code: No data was found    Visit Count: 13   Diagnosis:   1. Cervicogenic headache        Subjective     Daily Subjective   Patient reports starting botox last Wednesday; 12 weeks will have the next round; since then no migraines to note, stiffness that day.  Cardiac MRI to in two weeks.  Today neck feels tight more so on R but better than usual.  Feels fatigued often.     Social Support/Occupation    Lives in: multiple level home    Lives with: spouse    Occupation: Pensions consultant (desk)      Precautions: No data was found  Allergies: Fruit blend flavor [flavoring agent], Adhesive [wound dressing adhesive], Latex, Oxycodone, and Other    Objective                     Treatment     Therapeutic Exercises - Justified to address any of the following:  To develop strength, endurance, ROM and/or flexibility.   Subjective taken for planning of session     Stretching of R LS 2x60sec by therapist     #4 bar shoulder flexion in sitting x20 cuing to decrease UT compensation      Neuromuscular Re-Education - Justified to address any of the following:   Re-education of movement, balance, coordination, kinesthetic sense, posture and/or proprioception for sitting and/or standing activities.   Supine chin tuck hold with head lift 10x5sec cuing for retraction maintenance 2 rounds    Serratus punch #5 2x10 cuing for eccentric control     Shoulder extension vs BTB  2x10 cuing for scapular setting    90/90 shoulder ER vs YTB 2x10 cuing to decrease UT compensation     CKC at plinth with shoulder horizontal abd 2x10 cuing for appropriate weight shift       Manual Therapy - Justified to address any of the following:    Mobilization of joints and soft tissues, manipulation, manual lymphatic drainage, and/or manual traction.    STM to B suboccipitals, cervical paraspinals, R LS and UT     SOR    Home Exercises   Access Code: [email or web address]  URL: [email or web address]  Date: 03/14/2022  Prepared by: Thressa Sheller    Exercises  - Supine Deep Neck Flexor Training - Repetitions  - 1 x daily - 7 x weekly - 2 sets - 10 reps  - Seated Shoulder Rolls  - 1 x daily - 7 x weekly - 2 sets - 10 reps  - Seated Levator Scapulae Stretch  - 1 x daily - 7 x weekly - 1 sets - 3 reps - 30 hold  - Shoulder External Rotation and Scapular Retraction with Resistance  - 1 x daily - 7 x weekly - 2 sets - 10 reps  - Standing Shoulder Horizontal Abduction with Resistance  - 1  x daily - 7 x weekly - 2 sets - 10 reps  - Shoulder Flexion Serratus Activation with Resistance  - 1 x daily - 7 x weekly - 2 sets - 10 reps  - Scaption with Dumbbells  - 1 x daily - 7 x weekly - 2 sets - 10 reps       ---      Flowsheet Row ---   Total Time    Timed Minutes 44 minutes   Total Time 44 minutes          Assessment   Patient reports decrease in frequency of migraines after initiate of botox treatment last week.  Patient demonstrates improved ability to self correct posture and perform POC exercises with appropriate body mechanics compared to previous sessions.  Patient educated on activity modification and performance of HEP throughout the day to avoid excessive fatigue secondary to PVCs.   Plan   Progress CKC, overhead, and postural stability       Goals      Goal 1: Patient will demonstrate independence in prescribed HEP with proper form, sets and reps for safe discharge to an independent program.    02/16/22 KD: Pt was  performing HEP consistently prior to being sick (in progress)     03/07/22 ES  Complaint with current program   Sessions: 16     Goal 2: Patient will demonstrate improvement in FOTO score from initial score of 56  to score of 66  to demonstrate functional improvement during course of care    02/16/22 KD: FOTO score 54/66 (In progress)    03/07/22 ESQ   Progressing 65     Sessions: 16     Goal 3: Patient will demonstrate supine chin tuck and lift for 30 seconds to demonstrate deep neck flexor strength to allow patient to sit for desk work for 3 hours without pain and without compensation     02/16/22 KD: Pt supine chintuck hold and head lift performed for 24 seconds and pt reports that she can sit at her desk for about 30 minutes before discomfort or compensation occurs (in progress)    03/07/22 ESQ  Reports ability to sit for two hours before symptoms    Sessions: 16     Goal 4: Patient will demonstrate R shoulder flexion AROM of greater than or equal to 155 degrees to allow patient to lift objects into overhead cabinets without pain and without compensation    02/16/22 KD: R shoulder flexion AROM 145 (in progress)    03/07/22 ESQ  Goal met    Sessions: 16                                                Thressa ShellerElise Cardell Rachel, DPT

## 2022-03-30 ENCOUNTER — Inpatient Hospital Stay: Payer: BLUE CROSS/BLUE SHIELD | Admitting: Physical Therapist

## 2022-04-06 ENCOUNTER — Inpatient Hospital Stay: Payer: BLUE CROSS/BLUE SHIELD | Admitting: Physical Therapist

## 2022-04-12 ENCOUNTER — Ambulatory Visit
Admission: RE | Admit: 2022-04-12 | Discharge: 2022-04-12 | Disposition: A | Discharge status: Home / Self Care | Payer: BLUE CROSS/BLUE SHIELD | Source: Ambulatory Visit | Attending: Clinical Cardiac Electrophysiology | Admitting: Clinical Cardiac Electrophysiology

## 2022-04-12 ENCOUNTER — Other Ambulatory Visit: Payer: Self-pay | Admitting: Clinical Cardiac Electrophysiology

## 2022-04-12 DIAGNOSIS — I493 Ventricular premature depolarization: Secondary | ICD-10-CM | POA: Insufficient documentation

## 2022-04-12 MED ORDER — GADOBUTROL 1 MMOL/ML IV SOSY (WRAP)
20.0000 mL | Freq: Once | INTRAVENOUS | Status: AC | PRN
Start: 2022-04-12 — End: 2022-04-12
  Administered 2022-04-12: 20 mL via INTRAVENOUS
  Filled 2022-04-12: qty 20

## 2022-04-13 ENCOUNTER — Telehealth (INDEPENDENT_AMBULATORY_CARE_PROVIDER_SITE_OTHER): Payer: Self-pay

## 2022-04-13 NOTE — Telephone Encounter (Signed)
-----   Message from Tod [place]ia, MD sent at 04/13/2022 11:42 AM EDT -----  Echo EF 52%.  MRI showed no scar, Reduced LVEF, but report states that number could be off.  Holter showed 40% ectopy.  Has appt next month, will need EKG confirmation of ectopy, and potential ablation.  mw  ----- Message -----  From: Letta Moynahan, [place]  Sent: 04/13/2022  10:55 AM EDT  To: Tod [place]ia, MD; [facility]    40% PVCs

## 2022-04-14 ENCOUNTER — Inpatient Hospital Stay: Payer: BLUE CROSS/BLUE SHIELD | Attending: Neurological Surgery | Admitting: Physical Therapist

## 2022-04-14 DIAGNOSIS — G4486 Cervicogenic headache: Secondary | ICD-10-CM | POA: Insufficient documentation

## 2022-04-14 NOTE — PT/OT Therapy Note (Signed)
Name: Hailey RamaChristine Linda [name] Age: 41 y.o.   Date of Service: 04/14/2022  Referring Physician: Elder CyphersGorsen, Robert M, MD   Date of Injury: 12/15/2021  Date Care Plan Established/Reviewed: 01/18/2022  Date Treatment Started: 01/18/2022  Date Care Plan Established/Reviewed No data was found  Date Treatment Started No data was found  (Historic) Date Care Plan Established/Reviewed No data was found  (Historic) Date Treatment Started No data was found   End of Certification Date: 04/17/2022  Sessions in Plan of Care: 16  Surgery Date: No data was found  MD Follow-up: No data was found  Medbridge Code: No data was found    Visit Count: 14   Diagnosis:   1. Cervicogenic headache        Subjective     Daily Subjective   Patient reports overall for the neck and shoulder things are going well; is doing a lot better with postural awareness throughout the day. Has about two migraines a week, which is normal for her, intensity is less.  Does occasionally have neck pain when turning to check blind spots.  HEP is going well for management.    Social Support/Occupation    Lives in: multiple level home    Lives with: spouse    Occupation: Pensions consultantAttorney (desk)      Precautions: No data was found  Allergies: Fruit blend flavor [flavoring agent], Adhesive [wound dressing adhesive], Latex, Oxycodone, and Other    Objective                     Treatment     Therapeutic Exercises - Justified to address any of the following:  To develop strength, endurance, ROM and/or flexibility.   Subjective taken for planning of session     Stretching of R LS and UT 2x60sec by therapist     #4 bar shoulder flexion in sitting x20 cuing to decrease UT compensation    HEP discussion and discharge planning       Neuromuscular Re-Education - Justified to address any of the following:   Re-education of movement, balance, coordination, kinesthetic sense, posture and/or proprioception for sitting and/or standing activities.   Supine chin tuck hold with head lift 10x5sec cuing  for retraction maintenance 2 rounds    Shoulder extension vs BTB 2x10 cuing for scapular setting    90/90 shoulder ER vs YTB 2x10 cuing to decrease UT compensation       Manual Therapy - Justified to address any of the following:    Mobilization of joints and soft tissues, manipulation, manual lymphatic drainage, and/or manual traction.    STM to B suboccipitals, cervical paraspinals, LS and UT     SOR    Home Exercises   Access Code: [email or web address]  URL: [email or web address]  Date: 03/14/2022  Prepared by: Thressa ShellerElise Nicolasa Milbrath    Exercises  - Supine Deep Neck Flexor Training - Repetitions  - 1 x daily - 7 x weekly - 2 sets - 10 reps  - Seated Shoulder Rolls  - 1 x daily - 7 x weekly - 2 sets - 10 reps  - Seated Levator Scapulae Stretch  - 1 x daily - 7 x weekly - 1 sets - 3 reps - 30 hold  - Shoulder External Rotation and Scapular Retraction with Resistance  - 1 x daily - 7 x weekly - 2 sets - 10 reps  - Standing Shoulder Horizontal Abduction with Resistance  - 1 x daily - 7 x weekly - 2 sets -  10 reps  - Shoulder Flexion Serratus Activation with Resistance  - 1 x daily - 7 x weekly - 2 sets - 10 reps  - Scaption with Dumbbells  - 1 x daily - 7 x weekly - 2 sets - 10 reps       ---      Flowsheet Row ---   Total Time    Timed Minutes 40 minutes   Total Time 40 minutes          Assessment   Patient presents with improved functional abilities and reports of ability to manage symptoms.  Patient tolerated therapy session well and is discharged from PT at this time to independence in HEP.    Plan   [employer] to independence in HEP      Goals      Goal 1: Patient will demonstrate independence in prescribed HEP with proper form, sets and reps for safe discharge to an independent program.    02/16/22 KD: Pt was performing HEP consistently prior to being sick (in progress)     03/07/22 ES  Complaint with current program   Sessions: 16     Goal 2: Patient will demonstrate improvement in FOTO score from initial score of 56  to score of 66   to demonstrate functional improvement during course of care    02/16/22 KD: FOTO score 54/66 (In progress)    03/07/22 ESQ   Progressing 65     Sessions: 16     Goal 3: Patient will demonstrate supine chin tuck and lift for 30 seconds to demonstrate deep neck flexor strength to allow patient to sit for desk work for 3 hours without pain and without compensation     02/16/22 KD: Pt supine chintuck hold and head lift performed for 24 seconds and pt reports that she can sit at her desk for about 30 minutes before discomfort or compensation occurs (in progress)    03/07/22 ESQ  Reports ability to sit for two hours before symptoms    Sessions: 16     Goal 4: Patient will demonstrate R shoulder flexion AROM of greater than or equal to 155 degrees to allow patient to lift objects into overhead cabinets without pain and without compensation    02/16/22 KD: R shoulder flexion AROM 145 (in progress)    03/07/22 ESQ  Goal met    Sessions: 16                                                Thressa Sheller, DPT

## 2022-04-15 NOTE — Telephone Encounter (Signed)
Patient notified of appt change.

## 2022-04-15 NOTE — Telephone Encounter (Signed)
Spoke to patient , she would very much like an earlier appt to discuss with MW, if at all possible ?

## 2022-04-19 ENCOUNTER — Encounter (INDEPENDENT_AMBULATORY_CARE_PROVIDER_SITE_OTHER): Payer: Self-pay | Admitting: Internal Medicine

## 2022-04-19 ENCOUNTER — Encounter (INDEPENDENT_AMBULATORY_CARE_PROVIDER_SITE_OTHER): Payer: Self-pay

## 2022-04-19 ENCOUNTER — Ambulatory Visit (FREE_STANDING_LABORATORY_FACILITY): Payer: BLUE CROSS/BLUE SHIELD | Admitting: Family

## 2022-04-19 VITALS — BP 137/85 | HR 60 | Temp 98.5°F | Resp 16 | Ht 66.54 in | Wt 315.0 lb

## 2022-04-19 DIAGNOSIS — J0110 Acute frontal sinusitis, unspecified: Secondary | ICD-10-CM

## 2022-04-19 DIAGNOSIS — Z20822 Contact with and (suspected) exposure to covid-19: Secondary | ICD-10-CM

## 2022-04-19 LAB — POCT RAPID STREP A: Rapid Strep A Screen POCT: NEGATIVE

## 2022-04-19 LAB — POCT INFLUENZA A/B
POCT Rapid Influenza A AG: NEGATIVE
POCT Rapid Influenza B AG: NEGATIVE

## 2022-04-19 LAB — IN OFFICE COVID POCT ABBOTT ID NOW: SARS CoV 2 Overall Result: NEGATIVE

## 2022-04-19 MED ORDER — AMOXICILLIN-POT CLAVULANATE 875-125 MG PO TABS
1.0000 | ORAL_TABLET | Freq: Two times a day (BID) | ORAL | 0 refills | Status: AC
Start: 2022-04-19 — End: 2022-04-26

## 2022-04-19 NOTE — Patient Instructions (Signed)
Please call your rheumatologist today and notify them of Augmentin prescription that we gave you today.  Follow-up with your primary care provider if your symptoms do not improve in the next 2 to 3 days.  Please seek emergency care if the symptoms are worsening.

## 2022-04-19 NOTE — Progress Notes (Signed)
[facility]  URGENT  CARE  PROGRESS NOTE     Patient: Hailey [name]   Date: 04/19/2022   MRN: [id number]       Bayyinah Dukeman is a 41 y.o. female      HISTORY     History obtained from: Patient    Chief Complaint   Patient presents with    Sore Throat    Cough    Fever     Onsets X 3 days.          Sore Throat   Associated symptoms include coughing.   Cough  Associated symptoms include a fever.   Fever   Associated symptoms include coughing.      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  States that her last dose of Humira was on Sunday.  States that she spoke with her rheumatologist this morning and notify them of the symptoms.  Denies shortness of breath, chest pain, nausea/vomiting, abdominal pain, difficulty swallowing, voice changes.    Review of Systems   Constitutional:  Positive for fever.   Respiratory:  Positive for cough.        History:    Pertinent Past Medical, Surgical, Family and Social History were reviewed.        Current Outpatient Medications:     Adalimumab 40 MG/0.4ML Pen-injector Kit, Inject 40 mg into the skin every 10 (ten) days, Disp: 6 each, Rfl: 1    botulinum Toxin Type A (Botox) 200 units Recon Soln injection, Inject 155 units IM every 12 weeks, Disp: 1 each, Rfl: 3    busPIRone (BUSPAR) 5 MG tablet, TAKE 1 TABLET (5 MG) BY MOUTH 3 (THREE) TIMES DAILY AS NEEDED (ANXIETY), Disp: 270 tablet, Rfl: 0    clindamycin (CLEOCIN T) 1 % lotion, APPLY TWICE A DAY TO BREAKOUTS ON FACE, WASH WITH OVER THE COUNTER BENZOYL PEROXIDE BEFORE HAND, Disp: , Rfl:     colesevelam (WELCHOL) 625 MG tablet, Take 1 tablet (625 mg) by mouth 2 (two) times daily with meals, Disp: 180 tablet, Rfl: 3    Dapsone 7.5 % Gel, APPLY DAILY TO ACNE/FOLLICULITIS ON FACE, Disp: , Rfl:     EPINEPHrine (EPIPEN 2-PAK) 0.3  MG/0.3ML Solution Auto-injector injection, Inject 0.3 mLs (0.3 mg) into the muscle once as needed (Anaphylaxis) ; go to ER after injection, Disp: 1 each, Rfl: 1    fexofenadine (Allegra Allergy) 180 MG tablet, , Disp: , Rfl:     fluocinonide (LIDEX) 0.05 % gel, APPLY TO AFFECTED SORES ON SKIN OR R MOUTH TWICE DAILY UNTIL RESOLVED., Disp: , Rfl:     hydrOXYzine (ATARAX) 10 MG tablet, Take 1 tablet (10 mg) by mouth nightly, Disp: 90 tablet, Rfl: 3    lidocaine-prilocaine (EMLA) cream, , Disp: , Rfl:     Multiple Vitamins-Minerals (MULTIVITAMIN ADULT PO), Take by mouth daily  , Disp: , Rfl:     Omega-3 1000 MG Cap, Take by mouth, Disp: , Rfl:     ondansetron (ZOFRAN-ODT) 4 MG disintegrating tablet, Take 1 tablet (4 mg) by mouth every 8 (eight) hours as needed for Nausea, Disp: 30 tablet, Rfl: 0    Otezla 30 MG Tab, TAKE 1 TABLET BY MOUTH 2 TIMES A DAY., Disp: 60 tablet, Rfl: 2    Rimegepant Sulfate 75 MG Tablet Dispersible, Take  1 tablet (75 mg) by mouth daily as needed (headache), Disp: 30 tablet, Rfl: 11    rizatriptan (MAXALT) 10 MG tablet, Take 1-2 tablets (10-20 mg total) by mouth once as needed for Migraine Max 3 days a week, Disp: 36 tablet, Rfl: 3    vitamin D (CHOLECALCIFEROL) 25 MCG (1000 UT) tablet, Take 1 tablet (1,000 Units) by mouth daily, Disp: , Rfl:     amoxicillin-clavulanate (AUGMENTIN) 875-125 MG per tablet, Take 1 tablet by mouth every 12 (twelve) hours for 7 days, Disp: 14 tablet, Rfl: 0    Azelastine-Fluticasone 137-50 MCG/ACT Suspension, 2 (two) times daily as needed, Disp: , Rfl:     Allergies   Allergen Reactions    Fruit Blend Flavor [Flavoring Agent] Anaphylaxis and Swelling    Adhesive [Wound Dressing Adhesive]      dermabond    Latex Other (See Comments), Hives and Swelling     Swelling and redness throughout body   Also severe allergy to surgical glue    Oxycodone Other (See Comments)     Hallucinations    Other Itching and Rash     Tropical fruits like coconuts, pineapple, kiwi, etc.        Medications and Allergies reviewed.    PHYSICAL EXAM     Vitals:    04/19/22 1156   BP: 137/85   Pulse: 60   Resp: 16   Temp: 98.5 F (36.9 C)   TempSrc: Axillary   SpO2: 97%   Weight: 142.9 kg (315 lb)   Height: 1.69 m (5' 6.54")       Physical Exam  Constitutional:       Appearance: Normal appearance.   HENT:      Head: Normocephalic and atraumatic.      Comments: Frontal sinus tenderness.     Right Ear: Tympanic membrane, ear canal and external ear normal.      Left Ear: Tympanic membrane, ear canal and external ear normal.      Nose: Congestion present.      Mouth/Throat:      Mouth: Mucous membranes are moist.      Pharynx: Posterior oropharyngeal erythema present. No oropharyngeal exudate.      Comments: Tonsils are erythematous but no enlargement or exudate.  Uvula is midline.    Eyes: Conjunctivae are normal. Pupils are equal, round, and reactive to light. Cardiovascular:      Rate and Rhythm: Normal rate and regular rhythm.      Pulses: Normal pulses.      Heart sounds: Normal heart sounds.   Pulmonary:      Effort: Pulmonary effort is normal.      Breath sounds: Normal breath sounds.   Abdominal:      General: Bowel sounds are normal.      Palpations: Abdomen is soft.      Tenderness: There is no abdominal tenderness.   Musculoskeletal:         General: Normal range of motion.      Cervical back: Normal range of motion and neck supple.   Neurological:      Mental Status: She is alert and oriented to person, place, and time.   Skin:     General: Skin is warm and dry.      Capillary Refill: Capillary refill takes less than 2 seconds.   Psychiatric:         Behavior: Behavior normal.         UCC COURSE  LABS  The following POCT tests were ordered, reviewed and discussed with the patient/family.     Results       Procedure Component Value Units Date/Time    POCT Rapid Group A Strep [[id number]]  (Normal) Collected: 04/19/22 1237    Specimen: Throat Updated: 04/19/22 1237     POCT QC Pass     Rapid  Strep A Screen POCT Negative     Comment Negative Results should be confirmed by throat Cx to confirm absence of Strep A inf.    POCT Influenza A/B [[id number]]  (Normal) Collected: 04/19/22 1236     Updated: 04/19/22 1237     POCT QC Pass     POCT Rapid Influenza A AG Negative     POCT Rapid Influenza B AG Negative    Abbott ID Now SARS-COV-2 POCT [[id number]]  (Normal) Collected: 04/19/22 1236    Specimen: Nasal Swab COVID-19 Updated: 04/19/22 1236     SARS CoV 2 Overall Result Negative            There were no x-rays reviewed with this patient during the visit.    No current facility-administered medications for this visit.       PROCEDURES     Procedures    MEDICAL DECISION MAKING         Chart Review:  Prior PCP, Specialist and/or ED notes reviewed today: No  Prior labs/images/studies reviewed today: No          ASSESSMENT     Encounter Diagnosis   Name Primary?    Acute frontal sinusitis, recurrence not specified Yes            PLAN      PLAN: RST, rapid COVID, flu are negative.  Shared decision making with patient to treat her for bacterial sinusitis.  Prescribed 7-day course of Augmentin.  Discussed red flags, ER precautions, over-the-counter symptomatic relief, follow-up with PCP if no improvement.  Also advised her to follow-up with her rheumatologist today and let them know that she is going to be on antibiotics.            Orders Placed This Encounter   Procedures    Throat Culture    Abbott ID Now SARS-COV-2 POCT    POCT Influenza A/B    POCT Rapid Group A Strep     Requested Prescriptions     Signed Prescriptions Disp Refills    amoxicillin-clavulanate (AUGMENTIN) 875-125 MG per tablet 14 tablet 0     Sig: Take 1 tablet by mouth every 12 (twelve) hours for 7 days       Discussed results and diagnosis with patient/family.  Reviewed warning signs for worsening condition, as well as, indications for follow-up with primary care physician and return to urgent care clinic.   Patient/family expressed  understanding of instructions.     An After Visit Summary was provided to the patient.

## 2022-04-21 ENCOUNTER — Inpatient Hospital Stay: Payer: BLUE CROSS/BLUE SHIELD | Admitting: Physical Therapist

## 2022-04-22 NOTE — Progress Notes (Signed)
Please notify patient throat culture is negative. If they are taking antibiotics, then they may stop. Please follow up if still having symptoms.

## 2022-04-24 ENCOUNTER — Other Ambulatory Visit (INDEPENDENT_AMBULATORY_CARE_PROVIDER_SITE_OTHER): Payer: Self-pay | Admitting: Family Medicine

## 2022-04-24 DIAGNOSIS — M352 Behcet's disease: Secondary | ICD-10-CM

## 2022-04-24 DIAGNOSIS — K120 Recurrent oral aphthae: Secondary | ICD-10-CM

## 2022-04-24 DIAGNOSIS — R110 Nausea: Secondary | ICD-10-CM

## 2022-04-27 ENCOUNTER — Encounter: Payer: Self-pay | Admitting: No Specialty

## 2022-04-28 ENCOUNTER — Inpatient Hospital Stay: Payer: BLUE CROSS/BLUE SHIELD | Admitting: Physical Therapist

## 2022-04-29 ENCOUNTER — Other Ambulatory Visit: Payer: Self-pay | Admitting: No Specialty

## 2022-04-29 MED ORDER — RIMEGEPANT SULFATE 75 MG PO TBDP
75.0000 mg | ORAL_TABLET | ORAL | 5 refills | Status: DC
Start: 2022-04-29 — End: 2022-09-12

## 2022-05-04 ENCOUNTER — Encounter (INDEPENDENT_AMBULATORY_CARE_PROVIDER_SITE_OTHER): Payer: Self-pay | Admitting: Internal Medicine

## 2022-05-04 ENCOUNTER — Telehealth (INDEPENDENT_AMBULATORY_CARE_PROVIDER_SITE_OTHER): Payer: BLUE CROSS/BLUE SHIELD | Admitting: Internal Medicine

## 2022-05-04 DIAGNOSIS — M352 Behcet's disease: Secondary | ICD-10-CM

## 2022-05-04 DIAGNOSIS — M0600 Rheumatoid arthritis without rheumatoid factor, unspecified site: Secondary | ICD-10-CM

## 2022-05-04 DIAGNOSIS — Z79899 Other long term (current) drug therapy: Secondary | ICD-10-CM

## 2022-05-04 DIAGNOSIS — K760 Fatty (change of) liver, not elsewhere classified: Secondary | ICD-10-CM

## 2022-05-04 NOTE — Patient Instructions (Signed)
Dear Hailey [name] ,     Thanks for arranging a video visit with me.    Here are things I'd like you to do following today's visit:  Please have blood taken for labs prior to your next visit.    You may retrieve any orders from this visit in Mychart by using these steps:    After signing into Mychart  1) click Messaging  2) click Letters  3) select the Requisition  4) Review and click print    Please contact the clinic at [phone] with any questions and to schedule your follow up visit.    Sincerely,    Debe Coder, MD  Rheumatologist  [facility]  [address]  [place], [place] [address]  Phone: [phone]  Fax: [phone]

## 2022-05-04 NOTE — Progress Notes (Signed)
RHEUMATOLOGY FOLLOW UP TELEMEDICINE NOTE  Telemedicine Documentation Requirements  Originating site (Patient location): home  Distant site (Provider location): Office  Provider and Title: Dr. Adin HectorMcBride  Type of Visit: Video Visit  Verbal Consent has been obtained to conduct a telemedicine visit on this date in order to minimize exposure to COVID-19.      PCP: Virgilio FreesMachado, Christian, MD    HPI:    This patient is a 41 y.o. year female with Past Medical History of Prior Cholecystectomy, Wold-Parkinson White, Prior Hysterectomy who returns for follow up of Behcet's HLAB51+, recurrent oral ulcers, pathergy, low grade fevers and seronegative arthritis.      Interval History:    She is pursuing cardiac evaluation, had monitor, ECHO and cardiac MRI-PVC burden is over 40%-She is awaiting follow up with Dr. Lujean AmelWish for work up of her arrythmia-this appears to be affecting her.    She is back on Humira every 10 days, otezla BID.  She completed antibiotics for a sinus infection.      She is on topical treatment for skin issues.    She has had an increase in GI symptoms-she has more diarrhea, more nausea and cramping.  She has some hematochezia.      She has reported occasional vaginal ulcers-not really painful.    She is having a lot of facial, flushing burning(?)    Dermatologist started her on dapsone-she has had a few scars that are healing-had injections from her dermatologist.    Derm: Dr. Shellee MiloPatricia Lucy    Imaging Reports Reviewed:  04/12/22  Cardiac MRI:  IMPRESSION:       1. No delayed gadolinium enhancement to suggest myocardial fibrosis or  scarring.  2. Normal right ventricular systolic function.  3. Qualitatively mild global left ventricular hypokinesis. Left ventricular  systolic function measures 32% and may be artifactually reduced secondary  to significant motion artifact.    Labs Reviewed:  04/19/22  Throat culture no B-hemolytic strep  POCT flu A/B  Abbot -      Previous Labs:  01/14/22  CMP wnl  CRP wnl  ViT D 31  ESR  wnl  Lipid panel wnl  TSH wnl      08/18/21  CBC wnl  CMP wnl  ESR wnl  CRP wnl    Previous Labs:  05/25/21  Quant Gold-  HepbSAb wnl  HepbSAg-  HepCAb-  HIV-        Previous Labs:  02/23/21  ESR wnl  CRP wnl  CMP ALT 42, AST normal    CRP wnl  ESR wnl  HLAB51 positive  IgE, IgA, IGG, IgM all wnl  CBC wnl    12/03/20  DIAGNOSIS:          A. Lesion, ulnar wrist, excision:     -Fibroadipose tissue and vasculature          B. Synovial tissue, biopsy:     -Fibroadipose tissue and synovium (see comment)          Comment:     Morphologic features typical of rheumatoid arthritis or infection are     not identified.    12/03/20  Bacterial Cultures: No Growth  Fungal Cultures: NG at one week  AFB: No growth    11/20/20  ESR 50  CRP wnl  IgG subclasses wnl  ANA comprehensive panel negative        Previous Labs:    [facility] Labs:  10/23/20  C-ANCA, P-ANCA, MPO, PR3 all negative  Mitochondrial Ab-  ANA IFA-  Scl70-  Centromere-  RNP-  SMith-  TPPA Ab-      05/18/20  Ferritin 291  CMP AST/A:T 59/70  CBC elevated lymphocytes    02/06/20  MPO-  PR3-  SSA-  aCL IgM 14  aCL IgG, IgA -  Lupus anticoagulant -  B2IgG, IgM -  ACE wnl  CCP-  RF-  Vit D 31  TSH wnl  ESR wnl  CK wnl  C4 wnl  C3 wnl  CRP wnl    07/2019  ANA Screen-  LDH wnl    07/23/2018 lymph node biopsy:DIAGNOSIS:          Lymph node, deep right neck, excisional biopsy:     -Lymph node with non-specific reactive changes   Wound, AFB and Fungal cultures negative at this time    BONE MARROW 05/03/2019      BONE MARROW, LEFT POSTERIOR ILIAC CREST, CORE BIOPSY, CLOT SECTION,    ASPIRATE SMEAR AND TOUCH PREP:        - NORMOCELLULAR BONE MARROW (approximate50-60%) WITH TRILINEAGE HEMATOPOIESIS,  NORMAL MYELOID TO ERYTHROID RATIO AND ADEQUATE NUMBERS OF    MEGAKARYOCYTES WIT H UNREMARKABLE MORPHOLOGY        - A FEW SMALL REACTIVE LYMPHOID AGGREGATES IDENTIFIED, AND MILDLY  INCREASED NUMBERS OF EOSINOPHILS        - PENDING CYTOGENETIC STUDIES OF BONE MARROW SAMPLE  - Remain pending as of  05/14/2019.        COMMENT:    The concurrent flow cytometry performed on the bone marrow aspirate  sample shows no evidence of immunophenotypic abnormalities      Imaging Reviewed:    08/16/20  MRI Brain:IMPRESSION:      1. No intracranial mass, hemorrhage, or hydrocephalus is detected.  2. There is normal pattern of enhancement of the brain parenchyma and  meningeal surfaces.    9/18 MRI Abdomen:  IMPRESSION:      1. Diffuse hepatic steatosis. No suspicious focal hepatic lesions.  2. No biliary ductal dilatation or choledocholithiasis.    02/03/20  MRI Pelvis:  IMPRESSION:         Cystic structure at the vaginal cuff, flank by both ovaries not  significantly changed in size compared to 2019. No abnormal internal  enhancement. This favors a benign process such as a peritoneal inclusion  cyst or a paraovarian cyst.    12/14/19 MRI R Hand:  IMPRESSION:      1.  No suspicious soft tissue mass, osseous mass, or masslike  enhancement in the right hand or wrist to correlate with the diffuse FDG  uptake on recent PET/CT.  2.  There is mild enhancing synovitis in the right wrist joint without  active joint erosion or bony destruction.  3.  A small dorsal ganglion cyst overlying the scapholunate interval.    12/06/19  Full Body PET Scan:    Hypermetabolic soft tissue mass in palmar R hand surface at base of the second digit-stable pulmonary nodulke 1.3 cm left lower lobe-recommend follow up Chest CT 3 months         The following sections were reviewed this encounter by the provider:   Tobacco  Allergies  Meds  Problems  Med Hx  Surg Hx  Fam Hx         PMH/PSH:  Past Medical History:   Diagnosis Date    Abnormal Pap smear of cervix     Anemia ~12    Pre-hysterectomy    Behcet's disease  BMI 45.0-49.9, adult     Breast lump     Diarrhea     occasional    Fever     for past ~ 3 months (avg daily fever 100-101)    Gastroesophageal reflux disease     Headache     Herpes simplex virus (HSV) infection     Lower back pain      Meningitis     Neck pain     Post-operative nausea and vomiting     Scoliosis     Swollen lymph nodes         Past Surgical History:   Procedure Laterality Date    ablasion  2005    ARTHROTOMY, WRIST Right 12/03/2020    Procedure: RIGHT UPPER EXTREMITY SYNOVIAL BIOPSY;  Surgeon: Kellie Simmering, MD;  Location: [place] [facility];  Service: Plastics;  Laterality: Right;    BIOPSY, LYMPH NODE N/A 07/23/2018    Procedure: BIOPSY, LYMPH NODE;  Surgeon: Shelda Pal, MD;  Location: [place] [facility];  Service: ENT;  Laterality: N/A;  NECK DEEP LYMPH NODE BIOPSY    BONE MARROW BIOPSY  2020    CHOLECYSTECTOMY  2009    D&C DIAGNOSTIC  2010    DILATION AND CURETTAGE OF UTERUS  2010    FINGER GANGLION CYST EXCISION  04/2018    GANGLION CYST EXCISION  2014    04/2018 cyst removed from index finger-[facility]    HERNIA REPAIR  2016    HYSTERECTOMY  2012    PELVIC LAPAROSCOPY  2011    UMBILICAL HERNIA REPAIR  2016    UPPER GASTROINTESTINAL ENDOSCOPY  2009    WISDOM TOOTH EXTRACTION  1998        FH/SH:  Family History   Adopted: Yes       Social History     Tobacco Use    Smoking status: Never    Smokeless tobacco: Never   Vaping Use    Vaping Use: Never used   Substance Use Topics    Alcohol use: Not Currently     Alcohol/week: 0.0 standard drinks of alcohol     Comment: rarely    Drug use: Never        Meds/ Allergies:  No outpatient medications have been marked as taking for the 05/04/22 encounter (Telemedicine Visit) with Baltazar Najjar, MD.     Allergies   Allergen Reactions    Fruit Blend Flavor [Flavoring Agent] Anaphylaxis and Swelling    Adhesive [Wound Dressing Adhesive]      dermabond    Latex Other (See Comments), Hives and Swelling     Swelling and redness throughout body   Also severe allergy to surgical glue    Oxycodone Other (See Comments)     Hallucinations    Other Itching and Rash     Tropical fruits like coconuts, pineapple, kiwi, etc.          PHYSICAL EXAM  General- WNWD, alert and oriented,  NAD  Eyes- EOMI, PERRL, no conjunctival injection, no scleral icterus  ENMT- face symmetric without lesions  Skin- no rash, no alopecia  Pulm: No conversational dyspnea, breathing comfortable on room air      .        LABS:  Lab Results   Component Value Date    WBC 9.1 01/14/2022    HGB 15.3 01/14/2022    HCT 44.4 01/14/2022    MCV 90 01/14/2022    PLT 370 01/14/2022  Lab Results   Component Value Date    CREAT 0.82 01/14/2022    BUN 13 01/14/2022    NA 141 01/14/2022    K 5.1 01/14/2022    CL 105 01/14/2022    CO2 18 (L) 01/14/2022      Lab Results   Component Value Date    ALT 28 01/14/2022    AST 22 01/14/2022    ALKPHOS 62 01/14/2022    BILITOTAL 0.4 01/14/2022     Lab Results   Component Value Date    ESR 15 01/14/2022    ESR 6 02/23/2021    ESR 7 01/25/2021    ESR 50 (H) 11/20/2020    ESR 11 02/06/2020      Lab Results   Component Value Date    CRP 1 01/14/2022    CRP <1 02/23/2021    CRP 2 01/25/2021    CRP 3 11/20/2020    CRP 0.2 02/06/2020      No results found for: "ANA"   Lab Results   Component Value Date    RHEUMFACTOR <15.0 02/06/2020      Lab Results   Component Value Date    CCP <15.6 02/06/2020     No results found for: "URICACID"        ASSESSMENT/PLAN:    This patient is a 41 y.o. year female with Past Medical History of Prior Cholecystectomy, Wolf-Parkinson White, Prior Hysterectomy, Fatty Liver Disease who returns for follow up of Behcet's Disease and seronegative rheumatoid arthritis.    Will continue Otezla 30 mg BID, and Humira 40 mg every ten days.     Following with cardiology for work up of PVCs-may be affecting heart function-she is following with cardiology.    Following with Neurology for migraines.    Would consider GI evaluation given intermittent hematochezia but suspect diarrhea and abdominal cramping may be otezla related.      1. Behcet's disease  - CBC and differential; Future  - Comprehensive metabolic panel; Future  - Sedimentation rate (ESR); Future  - C Reactive Protein;  Future    2. Fatty liver disease, nonalcoholic  - CBC and differential; Future  - Comprehensive metabolic panel; Future  - Sedimentation rate (ESR); Future  - C Reactive Protein; Future    3. Seronegative rheumatoid arthritis  - CBC and differential; Future  - Comprehensive metabolic panel; Future  - Sedimentation rate (ESR); Future  - C Reactive Protein; Future    4. High risk medication use  - CBC and differential; Future  - Comprehensive metabolic panel; Future  - Sedimentation rate (ESR); Future  - C Reactive Protein; Future                 Total Time spent reviewing records, interviewing patient and coordinating plan of care: 20 min    FOLLOW UP:  Return in about 3 months (around 08/04/2022) for Follow Up, video OK.                                                                                  Debe Coder, MD  Rheumatologist  [facility]  [address]  [place], [place]  [address]  Phone: [phone]  Fax: [phone]

## 2022-05-05 ENCOUNTER — Encounter (INDEPENDENT_AMBULATORY_CARE_PROVIDER_SITE_OTHER): Payer: Self-pay | Admitting: Internal Medicine

## 2022-05-05 DIAGNOSIS — K760 Fatty (change of) liver, not elsewhere classified: Secondary | ICD-10-CM

## 2022-05-05 DIAGNOSIS — M0600 Rheumatoid arthritis without rheumatoid factor, unspecified site: Secondary | ICD-10-CM

## 2022-05-05 DIAGNOSIS — K120 Recurrent oral aphthae: Secondary | ICD-10-CM

## 2022-05-05 DIAGNOSIS — M352 Behcet's disease: Secondary | ICD-10-CM

## 2022-05-05 MED ORDER — OTEZLA 30 MG PO TABS
1.0000 | ORAL_TABLET | Freq: Two times a day (BID) | ORAL | 1 refills | Status: DC
Start: 2022-05-05 — End: 2022-11-11

## 2022-05-05 MED ORDER — ADALIMUMAB 40 MG/0.4ML SC PNKT
40.0000 mg | PEN_INJECTOR | SUBCUTANEOUS | 1 refills | Status: DC
Start: 2022-05-05 — End: 2022-08-04

## 2022-05-17 ENCOUNTER — Encounter (INDEPENDENT_AMBULATORY_CARE_PROVIDER_SITE_OTHER): Payer: Self-pay | Admitting: Internal Medicine

## 2022-05-17 ENCOUNTER — Encounter (INDEPENDENT_AMBULATORY_CARE_PROVIDER_SITE_OTHER): Payer: Self-pay | Admitting: Clinical Cardiac Electrophysiology

## 2022-05-17 ENCOUNTER — Ambulatory Visit (INDEPENDENT_AMBULATORY_CARE_PROVIDER_SITE_OTHER): Payer: BLUE CROSS/BLUE SHIELD | Admitting: Clinical Cardiac Electrophysiology

## 2022-05-17 VITALS — BP 130/82 | HR 99 | Ht 66.54 in | Wt 315.0 lb

## 2022-05-17 DIAGNOSIS — I428 Other cardiomyopathies: Secondary | ICD-10-CM | POA: Insufficient documentation

## 2022-05-17 DIAGNOSIS — Z8679 Personal history of other diseases of the circulatory system: Secondary | ICD-10-CM

## 2022-05-17 DIAGNOSIS — I493 Ventricular premature depolarization: Secondary | ICD-10-CM

## 2022-05-17 LAB — ECG 12-LEAD
Atrial Rate: 99 {beats}/min
P Axis: 61 degrees
P-R Interval: 168 ms
Q-T Interval: 352 ms
QRS Duration: 74 ms
QTC Calculation (Bezet): 451 ms
R Axis: 81 degrees
T Axis: 80 degrees
Ventricular Rate: 99 {beats}/min

## 2022-05-17 NOTE — Progress Notes (Signed)
IMG ARRHYTHMIA OFFICE VISIT    I had the pleasure of seeing Hailey [name] today for arrhythmia follow up.       She is a patient of Dr. Wilmon Arms.    The patient is a 41 year old woman who underwent ablation in 2005 at [facility] [place] for Wolff-Parkinson-White.  This took care of her syncope and sustained palpitations.     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  She sees Dr. Adin Hector.  She has a history of migraines, has seen Dr. Theodoro Grist.    We initially saw her in April.  She was getting episodes of lightheadedness, skipped beats, and heart rate monitor showing bradycardia.   Our EKG at that time showed bigeminy.  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  Coupling interval was variable.    3-day monitor showed 40% PVCs.  Echocardiogram showed ejection fraction 52% with mild global hypokinesis.  MRI showed no delayed gadolinium enhancement.  Left ventricular ejection fraction 32%.    She is aware of palpitations, not feeling well.    PMH:   Past Medical History:   Diagnosis Date    Abnormal Pap smear of cervix     Anemia ~12    Pre-hysterectomy    Behcet's disease     BMI 45.0-49.9, adult     Breast lump     Diarrhea     occasional    Fever     for past ~ 3 months (avg daily fever 100-101)    Gastroesophageal reflux disease     Headache     Herpes simplex virus (HSV) infection     Lower back pain     Meningitis     Neck pain     Post-operative nausea and vomiting     Scoliosis     Swollen lymph nodes         MEDICATIONS:   Current Outpatient Medications   Medication Sig Dispense Refill    Adalimumab 40 MG/0.4ML Pen-injector Kit Inject 40 mg into the skin every 10 (ten) days 6 each 1    Apremilast (Otezla) 30 MG Tab Take 1 tablet (30 mg) by mouth 2 (two) times daily 180 tablet 1    Azelastine-Fluticasone 137-50 MCG/ACT Suspension 2 (two) times daily as needed      botulinum Toxin Type A (Botox) 200 units Recon Soln injection Inject 155 units IM every 12 weeks 1  each 3    busPIRone (BUSPAR) 5 MG tablet TAKE 1 TABLET (5 MG) BY MOUTH 3 (THREE) TIMES DAILY AS NEEDED (ANXIETY) 270 tablet 0    clindamycin (CLEOCIN T) 1 % lotion APPLY TWICE A DAY TO BREAKOUTS ON FACE, WASH WITH OVER THE COUNTER BENZOYL PEROXIDE BEFORE HAND      colesevelam (WELCHOL) 625 MG tablet Take 1 tablet (625 mg) by mouth 2 (two) times daily with meals 180 tablet 3    Dapsone 7.5 % Gel APPLY DAILY TO ACNE/FOLLICULITIS ON FACE      fexofenadine (Allegra Allergy) 180 MG tablet       fluocinonide (LIDEX) 0.05 % gel APPLY TO AFFECTED SORES ON SKIN OR R MOUTH TWICE DAILY UNTIL RESOLVED.      hydrOXYzine (ATARAX) 10 MG tablet Take 1 tablet (10 mg) by mouth nightly 90 tablet 3    lidocaine-prilocaine (EMLA) cream       Multiple Vitamins-Minerals (MULTIVITAMIN ADULT PO) Take by mouth daily         Omega-3 1000 MG Cap  Take by mouth      ondansetron (ZOFRAN-ODT) 4 MG disintegrating tablet TAKE 1 TABLET (4 MG) BY MOUTH EVERY 8 (EIGHT) HOURS AS NEEDED FOR NAUSEA 30 tablet 2    Rimegepant Sulfate 75 MG Tablet Dispersible Take 1 tablet (75 mg) by mouth every other day 16 tablet 5    rizatriptan (MAXALT) 10 MG tablet Take 1-2 tablets (10-20 mg total) by mouth once as needed for Migraine Max 3 days a week 36 tablet 3    vitamin D (CHOLECALCIFEROL) 25 MCG (1000 UT) tablet Take 1 tablet (1,000 Units) by mouth daily      EPINEPHrine (EPIPEN 2-PAK) 0.3 MG/0.3ML Solution Auto-injector injection Inject 0.3 mLs (0.3 mg) into the muscle once as needed (Anaphylaxis) ; go to ER after injection (Patient not taking: Reported on 05/17/2022) 1 each 1     No current facility-administered medications for this visit.        Meds reviewed, no changes since last visit.    SH:   Social History     Tobacco Use    Smoking status: Never    Smokeless tobacco: Never   Vaping Use    Vaping Use: Never used   Substance Use Topics    Alcohol use: Not Currently     Alcohol/week: 0.0 standard drinks of alcohol     Comment: rarely    Drug use: Never        FH: no history of sudden death.  Family History reviewed and is otherwise not pertinent    REVIEW OF SYSTEMS: All other systems reviewed and negative except as above.    PHYSICAL EXAMINATION  General Appearance: A well-appearing female in no acute distress.   Vital Signs: BP 130/82 (BP Site: Right arm, Patient Position: Sitting, Cuff Size: Large)   Pulse 99   Ht 1.69 m (5' 6.54")   Wt 142.9 kg (315 lb)   LMP  (LMP Unknown)   BMI 50.03 kg/m    Neck: Supple without jugular venous distention. Thyroid nonpalpable. Normal carotid upstrokes without bruits.  Chest: Clear to auscultation bilaterally with good air movement and respiratory effort and no wheezes, rales, or rhonchi  Cardiovascular: Normal S1 and physiologically split S2 without murmurs, gallops or rub. PMI of normal size and nondisplaced.     ECG: frequent PVC's noted, normal sinus rhythm at 99 bpm, normal axis and intervals, no significant ST/T changes.      IMPRESSION/RECOMMENDATIONS: [name] is a 41 y.o. female who presents for follow up.      Frequent ventricular ectopy.  Holter monitor showed burden 40%.  Echocardiogram showed ejection fraction 52%, but MRI showed ejection fraction 32%.  The burden of her ectopy is causing a myopathy.  We need to proceed with an ablation.  I discussed with them that I suspect that this is left ventricular outflow tract, but could be epicardial.  I explained to them the difference between transaortic, into the coronary sinus, versus the need to go epicardial.  We discussed the procedure.      Ablation Scheduling Addendum   1.   Procedure:  PVC  2.   Mapping:   CARTO  3.   Ablation:   RF  4.   Anesthesia Plan:  MAC   5.   Pre-Op Imaging:  NONE  6.   Anticoag Plan:  N/A  7.   Other Meds:  Take all medications on day of procedure  8.   Discharge Plan:  Discharge Plan: SAME DAY DISCHARGE FROM  [facility]  9.   Additional Info:     VK

## 2022-05-27 ENCOUNTER — Other Ambulatory Visit (INDEPENDENT_AMBULATORY_CARE_PROVIDER_SITE_OTHER): Payer: Self-pay | Admitting: Cardiovascular Disease

## 2022-05-28 LAB — CBC AND DIFFERENTIAL
Baso(Absolute): 0 10*3/uL (ref 0.0–0.2)
Basophils Automated: 0 %
Eosinophils Absolute: 0.1 10*3/uL (ref 0.0–0.4)
Eosinophils Automated: 1 %
Hematocrit: 42.9 % (ref 34.0–46.6)
Hemoglobin: 14.5 g/dL (ref 11.1–15.9)
Immature Granulocytes Absolute: 0 10*3/uL (ref 0.0–0.1)
Immature Granulocytes: 0 %
Lymphocytes Absolute: 3 10*3/uL (ref 0.7–3.1)
Lymphocytes Automated: 34 %
MCH: 30.5 pg (ref 26.6–33.0)
MCHC: 33.8 g/dL (ref 31.5–35.7)
MCV: 90 fL (ref 79–97)
Monocytes Absolute: 0.6 10*3/uL (ref 0.1–0.9)
Monocytes: 7 %
Neutrophils Absolute Count: 5.1 10*3/uL (ref 1.4–7.0)
Neutrophils: 58 %
Platelets: 356 10*3/uL (ref 150–450)
RBC: 4.75 x10E6/uL (ref 3.77–5.28)
RDW: 12.3 % (ref 11.7–15.4)
WBC: 8.8 10*3/uL (ref 3.4–10.8)

## 2022-05-28 LAB — BASIC METABOLIC PANEL
BUN / Creatinine Ratio: 13 (ref 9–23)
BUN: 10 mg/dL (ref 6–24)
CO2: 21 mmol/L (ref 20–29)
Calcium: 10 mg/dL (ref 8.7–10.2)
Chloride: 102 mmol/L (ref 96–106)
Creatinine: 0.8 mg/dL (ref 0.57–1.00)
Glucose: 92 mg/dL (ref 70–99)
Potassium: 5 mmol/L (ref 3.5–5.2)
Sodium: 139 mmol/L (ref 134–144)
eGFR: 94 mL/min/{1.73_m2} (ref >59)

## 2022-06-02 ENCOUNTER — Ambulatory Visit (INDEPENDENT_AMBULATORY_CARE_PROVIDER_SITE_OTHER): Payer: BLUE CROSS/BLUE SHIELD | Admitting: Clinical Cardiac Electrophysiology

## 2022-06-03 ENCOUNTER — Encounter (INDEPENDENT_AMBULATORY_CARE_PROVIDER_SITE_OTHER): Payer: Self-pay

## 2022-06-03 ENCOUNTER — Ambulatory Visit (INDEPENDENT_AMBULATORY_CARE_PROVIDER_SITE_OTHER): Payer: BLUE CROSS/BLUE SHIELD

## 2022-06-03 NOTE — PSS Phone Screening (Signed)
Pre-Anesthesia Evaluation    Pre-op phone visit requested by:   Reason for pre-op phone visit: Patient anticipating Ablation - PVC's procedure.         No orders of the defined types were placed in this encounter.      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   09/25/2020    History of Wolff-Parkinson-White (WPW) syndrome Z86.79   09/25/2020    Overview Signed 09/25/2020  1:30 PM by Lurlean Horns, MD     Ablation 2005         Scoliosis deformity of spine M41.9   09/25/2020    Behcet's disease M35.2   02/12/2021    Immunocompromised state due to drug therapy D84.821, Z79.899   04/19/2021    S/P hysterectomy Z90.710   01/14/2022    Premature ventricular contractions (PVCs) (VPCs) I49.3   03/02/2022    Palpitations R00.2   03/02/2022    NICM (nonischemic cardiomyopathy) I42.8   05/17/2022        Medical History   Diagnosis Date    Abnormal Pap smear of cervix     Behcet's disease     She sees Dr. Adin Hector    BMI 45.0-49.9, adult     Diarrhea     occasional // poss. r/t meds    Headache     on Rx    Lower back pain     Meningitis 2011    Viral // treated and resolved    Neck pain     PT// Improved    Post-operative nausea and vomiting     PVC (premature ventricular contraction)     Scoliosis     moderate// used a Brace at a young age     Past Surgical History:   Procedure Laterality Date    ARTHROTOMY, WRIST Right 12/03/2020    Procedure: RIGHT UPPER EXTREMITY SYNOVIAL BIOPSY;  Surgeon: Kellie Simmering, MD;  Location: [facility];  Service: Plastics;   Laterality: Right;    BIOPSY, LYMPH NODE N/A 07/23/2018    Procedure: BIOPSY, LYMPH NODE;  Surgeon: Shelda Pal, MD;  Location: [facility];  Service: ENT;  Laterality: N/A;  NECK DEEP LYMPH NODE BIOPSY    BONE MARROW BIOPSY  2020    CARDIAC ABLATION  2005    [facility] for Wolff-Parkinson-White    CHOLECYSTECTOMY  2009    D&C DIAGNOSTIC  2010    DILATION AND CURETTAGE OF UTERUS  2010    FINGER GANGLION CYST EXCISION  04/2018    GANGLION CYST EXCISION  2014    04/2018 cyst removed from index finger-[facility]    HERNIA REPAIR  2016    HYSTERECTOMY  2012    PELVIC LAPAROSCOPY  2011    UMBILICAL HERNIA REPAIR  2016    UPPER GASTROINTESTINAL ENDOSCOPY  2009    WISDOM  TOOTH EXTRACTION  1998        Medication List            Accurate as of June 03, 2022  1:01 PM. Always use your most recent med list.                Adalimumab 40 MG/0.4ML Pnkt  Inject 40 mg into the skin every 10 (ten) days  Medication Adjustments for Surgery: Take as prescribed     Allegra Allergy 180 MG tablet  Take 1 tablet (180 mg) by mouth daily  Generic drug: fexofenadine  Medication Adjustments for Surgery: Take as prescribed     Azelastine-Fluticasone 137-50 MCG/ACT Susp  1 spray by Each Nare route 2 (two) times daily as needed  Medication Adjustments for Surgery: Take as prescribed     Botox 200 units Solr injection  Inject 155 units IM every 12 weeks  Generic drug: botulinum Toxin Type A  Medication Adjustments for Surgery: Take as prescribed     busPIRone 5 MG tablet  TAKE 1 TABLET (5 MG) BY MOUTH 3 (THREE) TIMES DAILY AS NEEDED (ANXIETY)  Commonly known as: BUSPAR  Medication Adjustments for Surgery: Take as prescribed     clindamycin 1 % lotion  Apply topically every morning  Commonly known as: CLEOCIN T  Medication Adjustments for Surgery: Hold day of surgery     colesevelam 625 MG tablet  Take 1 tablet (625 mg) by mouth 2 (two) times daily with meals  Commonly known as: WELCHOL  Medication  Adjustments for Surgery: Take as prescribed     Dapsone 7.5 % Gel  Apply topically every morning  Medication Adjustments for Surgery: Hold day of surgery     EPINEPHrine 0.3 MG/0.3ML Soaj injection  Inject 0.3 mLs (0.3 mg) into the muscle once as needed (Anaphylaxis) ; go to ER after injection  Medication Adjustments for Surgery: Take as needed     hydrOXYzine 10 MG tablet  Take 1 tablet (10 mg) by mouth nightly  Commonly known as: ATARAX  Medication Adjustments for Surgery: Take as prescribed     lidocaine-prilocaine cream  Apply topically as needed Dermatology procedures  Commonly known as: EMLA  Medication Adjustments for Surgery: Take as needed     MULTIVITAMIN ADULT PO  Take by mouth nightly  Medication Adjustments for Surgery: Stop 7 days before surgery     Omega-3 1000 MG Caps  Take by mouth nightly  Medication Adjustments for Surgery: Stop 7 days before surgery     ondansetron 4 MG disintegrating tablet  TAKE 1 TABLET (4 MG) BY MOUTH EVERY 8 (EIGHT) HOURS AS NEEDED FOR NAUSEA  Commonly known as: ZOFRAN-ODT  Medication Adjustments for Surgery: Take as needed     Otezla 30 MG Tabs  Take 1 tablet (30 mg) by mouth 2 (two) times daily  Generic drug: Apremilast  Medication Adjustments for Surgery: Take as prescribed     Rimegepant Sulfate 75 MG Tbdp  Take 1 tablet (75 mg) by mouth every other day  Medication Adjustments for Surgery: Take as prescribed     rizatriptan 10 MG tablet  Take 1-2 tablets (10-20 mg total) by mouth once as needed for Migraine Max 3 days a week  Commonly known as: MAXALT  Medication Adjustments for Surgery: Take as prescribed     vitamin D 25 MCG (1000 UT) tablet  Take 1 tablet (1,000 Units) by mouth nightly  Commonly known as: CHOLECALCIFEROL  Medication Adjustments for Surgery: Stop 7 days before  surgery            Allergies   Allergen Reactions    Fruit Blend Flavor [Flavoring Agent] Anaphylaxis and Swelling    Latex Other (See Comments), Hives and Swelling     Swelling and redness  throughout body   Also severe allergy to surgical glue    Adhesive [Wound Dressing Adhesive] Itching     dermabond    Other Itching and Rash     Sutures Monocryl // and Tropical fruits like coconuts, pineapple, kiwi, etc.    Oxycodone Hallucinations     Family History   Adopted: Yes     Social History     Occupational History    Occupation: Attorney    Tobacco Use    Smoking status: Never    Smokeless tobacco: Never   Vaping Use    Vaping Use: Never used   Substance and Sexual Activity    Alcohol use: Not Currently     Alcohol/week: 0.0 standard drinks of alcohol     Comment: rarely    Drug use: Never    Sexual activity: Yes     Partners: Male     Birth control/protection: None     Comment: Hysterectomy       Menstrual History:   LMP / Status  Hysterectomy     No LMP recorded (lmp unknown). Patient has had a hysterectomy.    Tubal Ligation?  No valid surgical or medical questions entered.             Exam Scores:   SDB score      PONV score      MST score      Allergy score      Frailty score         Visit Vitals  Ht 1.727 m (5\' 8" )   Wt 142.9 kg (315 lb)   LMP  (LMP Unknown)   BMI 47.90 kg/m       Recent Labs   CBC (last 180 days) 01/14/22  0000 05/27/22  1001   WBC 9.1 8.8   RBC 4.92 4.75   Hemoglobin 15.3 14.5   Hematocrit 44.4 42.9   MCV 90 90   MCH 31.1 30.5   MCHC 34.5 33.8   RDW 12.6 12.3   Platelets 370 356     Recent Labs   BMP (last 180 days) 01/14/22  0000 05/27/22  1001   Glucose 99 92   BUN 13 10   Creatinine 0.82 0.80   Sodium 141 139   Potassium 5.1 5.0   Chloride 105 102   CO2 18* 21   Calcium 9.8 10.0   eGFR 92 94         Recent Labs   Other (last 180 days) 01/14/22  0000 01/25/22  1719 04/19/22  1236   TSH 2.040  --   --    Bilirubin, Total 0.4  --   --    ALT 28  --   --    AST (SGOT) 22  --   --    Protein, Total 7.4  --   --    SARS CoV 2 Overall Result  --  Negative Negative

## 2022-06-03 NOTE — Pre-Procedure Instructions (Signed)
Important Instructions Before Your Procedure    Your case is currently scheduled for 06/08/2022 with [name], MD.      The date and/or time of your surgery may change.  Your surgeon's office will notify you, up until the business day before surgery, if there is any change to your surgery date or time.  Please don't hesitate to call your surgeon's office directly with any questions.    Arrival Time: Arrival Time: EP Navigator will call you 24-48 hours before your procedure date with the time of your procedure and your arrival time.     The date and/or time of your surgery may change.  Your surgeon's office will notify you, up until the business day before surgery, if there is any change to your surgery date or time.  Please don't hesitate to call your surgeon's office directly with any questions.    Your procedure will take place at:   [facility]  [address], [place] [address]   Follow signs to the [facility]  Cardiac Cath Lab - Electrophysiology Lab - Vascular Interventional Radiology (CC/EP/IR) - Ground Floor - [phone]    Day of procedure contact:    Call [phone] if you are running late, run into traffic or any other delay the day of your surgery/procedure    Eating and Drinking Instructions:    Follow your surgeon's instruction regarding your eating and drinking prior procedure   If you have not been given instructions by the surgeon:   Have nothing to eat after 11:00 PM the night before your procedure, including gum, hard candy or mints  You may have sips of clear liquids 4 hours before your procedure start time, or as directed by your doctor  Clear liquids includes water, Gatorade, black coffee (no creamer, or any milk) or regular tea (no herbal or green tea)    Directions:   From the "G" Level of the [employer], enter the [employer] [facility])    Continue down the corridor until it opens into a round  lobby  The registration desk for CC/EP/IR will be on your left.  Valet Parking:   Valet parking is at the [employer] marked "[facility]"; Leave your car with the Valet and continue across the atrium to the procedure check in. Wheelchairs are available upon request.     Other Instructions:  You must have a responsible adult family or friend to take you home safely; patients cannot go home in an [employer], or taxi-like service without a responsible adult with you.  You may have no more than 2 adults with you the day of surgery  Bring a list of your medications (Do Not bring any medications from home)  Bring photo ID, insurance card, and method of payment for copay  Bring a case for glasses, hearing aids, dentures, marked with your name and date of birth.      Do not use any lotions, perfumes/colognes, cosmetics, lotions, creams, powders, or deodorant   Avoid shaving the surgical site                                    Wear loose comfortable clothing, easy to change in and out of  Wear no jewelry or metal; if jewelry is not removable, notify pre-op nurse when you arrive  If you become sick or your condition changes, immediately call your surgeon's office                         Avoid Alcohol/tobacco for at least 24 hours        QUESTIONS:  If you have any questions about the information from this interview, please call the Pre-Procedure Nurse at: [phone].     IMPORTANT You must visit the Preparing for Your Procedure guide link below for additional instructions including fasting guidelines and directions before your procedure. If you received fasting or skin preparation instructions from your surgeon or pre-procedural provider, please follow those specific instructions. The instructions here are general instructions that do not pertain to all patients.  [email or web address]  If the link doesn't open,  please copy and paste in your browser

## 2022-06-06 NOTE — Progress Notes (Signed)
Spoke with patient at 0915 on 7/31.   Procedure Type: Ablation Procedures: PVC   Procedure time and date given as 0900 on 8/2.   Arrival time given as 0700.      Labs 7/21    Per MD note:   Ablation Scheduling Addendum   1.   Procedure:            PVC  2.   Mapping:               CARTO  3.   Ablation:                RF  4.   Anesthesia Plan:   MAC   5.   Pre-Op Imaging:   NONE  6.   Anticoag Plan:       N/A  7.   Other Meds:          Take all medications on day of procedure  8.   Discharge Plan:    Discharge Plan: SAME DAY DISCHARGE FROM [facility]  9.   Additional Info:     VK

## 2022-06-07 ENCOUNTER — Encounter: Payer: Self-pay | Admitting: Cardiovascular Disease

## 2022-06-07 NOTE — Anesthesia Preprocedure Evaluation (Signed)
Anesthesia Evaluation    AIRWAY    Mallampati: II    TM distance: >3 FB  Neck ROM: full  Mouth Opening:full  Planned to use difficult airway equipment: No CARDIOVASCULAR    cardiovascular exam normal       DENTAL    no notable dental hx               PULMONARY    pulmonary exam normal     OTHER FINDINGS    05/27/22 10:01  WBC: 8.8  Hemoglobin: 14.5  Hematocrit: 42.9  Platelet Count: 356    05/27/22 10:01  Glucose: 92  BUN: 10  Creatinine: 0.80  BUN / Creatinine Ratio: 13  Sodium: 139  Potassium: 5.0  Chloride: 102  CO2: 21    Echo:  Summary    * The left ventricle is mildly dilated.    * Left ventricular wall thickness is normal.    * Left ventricular systolic function is mildly decreased with an ejection  fraction by Biplane Method of Discs of  52 %.    * There is mild global hypokinesis.    * Left ventricular diastolic filling parameters demonstrate normal diastolic  function.    * The right ventricular cavity size is normal in size.    * Normal right ventricular systolic function.    * The left atrium is upper normal in size.    * There is mild mitral regurgitation.    * There is trace tricuspid regurgitation.    * Insufficient tricuspid regurgitation jet to estimate pulmonary artery  systolic pressure.    * No pulmonary hypertension with estimated right ventricular systolic  pressure of at least 28 mmHg.    * The IVC is normal in size with > 50% respiratory variance consistent with  normal RA pressure of 3 mmHg.    * The aortic root is normal in size.    * No pericardial effusion visualized.    * No prior study is available for comparison    Cardiology:  I had the pleasure of seeing Hailey [name] today for arrhythmia follow up.      She is a patient of Dr. Wilmon Arms.    The patient is a 41 year old woman who underwent ablation in 2005 at [facility] [place] for Wolff-Parkinson-White. This took care of her syncope and sustained palpitations.    She subsequently had FUO, and was found to have  Behcet's disease. She sees Dr. Adin Hector. She has a history of migraines, has seen Dr. Theodoro Grist.    We initially saw her in April.  She was getting episodes of lightheadedness, skipped beats, and heart rate monitor showing bradycardia.   Our EKG at that time showed bigeminy.  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  Coupling interval was variable.    3-day monitor showed 40% PVCs.  Echocardiogram showed ejection fraction 52% with mild global hypokinesis.  MRI showed no delayed gadolinium enhancement.  Left ventricular ejection fraction 32%.    She is aware of palpitations, not feeling well.    IMPRESSION/RECOMMENDATIONS: Hailey [name] is a 41 y.o. female who presents for follow up.      Frequent ventricular ectopy.  Holter monitor showed burden 40%.  Echocardiogram showed ejection fraction 52%, but MRI showed ejection fraction 32%.  The burden of her ectopy is causing a myopathy.  We need to proceed with an ablation.  I discussed with them that I suspect that this is left ventricular outflow tract, but  could be epicardial.  I explained to them the difference between transaortic, into the coronary sinus, versus the need to go epicardial.  We discussed the procedure.      Ablation Scheduling Addendum   1.   Procedure:            PVC  2.   Mapping:               CARTO  3.   Ablation:                RF  4.   Anesthesia Plan:   MAC   5.   Pre-Op Imaging:   NONE  6.   Anticoag Plan:       N/A  7.   Other Meds:          Take all medications on day of procedure  8.   Discharge Plan:    Discharge Plan: SAME DAY DISCHARGE FROM [facility]  9.   Additional Info:     VK                                     Relevant Problems   NEURO/PSYCH   (+) Migraine with aura and without status migrainosus, not intractable      CARDIO   (+) Behcet's disease   (+) Migraine with aura and without status migrainosus, not intractable   (+) PVC's (premature ventricular contractions)   (+) Premature ventricular contractions (PVCs) (VPCs)      Post-operative nausea and vomiting [R11.2, Z98.890]       Meningitis [G03.9] 2011 Viral // treated and resolved    Diarrhea [R19.7]  occasional // poss. r/t meds    Scoliosis [M41.9]  moderate// used a Brace at a young age    BMI 45.0-49.9, adult [Z68.42]      Abnormal Pap smear of cervix [R87.619]      Headache [R51.9]  on Rx    Neck pain [M54.2]  PT// Improved    Lower back pain [M54.50]      Behcet's disease [M35.2]  She sees Dr. Adin Hector    PVC (premature ventricular contraction) [I49.3]                      Anesthesia Plan    ASA 3     general                     intravenous induction   Detailed anesthesia plan: general IV, MAC and general LMA        Post op pain management: per surgeon    informed consent obtained    Plan discussed with CRNA.      pertinent labs reviewed             Signed by: Lovena Neighbours, MD 06/07/22 8:41 PM

## 2022-06-08 ENCOUNTER — Ambulatory Visit: Payer: BLUE CROSS/BLUE SHIELD | Admitting: Anesthesiology

## 2022-06-08 ENCOUNTER — Encounter (INDEPENDENT_AMBULATORY_CARE_PROVIDER_SITE_OTHER): Payer: Self-pay | Admitting: Cardiovascular Disease

## 2022-06-08 ENCOUNTER — Encounter
Admission: RE | Disposition: A | Discharge status: Home / Self Care | Payer: Self-pay | Source: Ambulatory Visit | Attending: Cardiovascular Disease

## 2022-06-08 ENCOUNTER — Telehealth: Payer: Self-pay

## 2022-06-08 ENCOUNTER — Ambulatory Visit
Admission: RE | Admit: 2022-06-08 | Discharge: 2022-06-08 | Disposition: A | Discharge status: Home / Self Care | Payer: BLUE CROSS/BLUE SHIELD | Source: Ambulatory Visit | Attending: Cardiovascular Disease | Admitting: Cardiovascular Disease

## 2022-06-08 DIAGNOSIS — I493 Ventricular premature depolarization: Secondary | ICD-10-CM | POA: Insufficient documentation

## 2022-06-08 HISTORY — PX: ABLATION - PVC'S: EP6

## 2022-06-08 LAB — I-STAT ACT KAOLIN
i-STAT ACT Kaolin: 299 s — ABNORMAL HIGH (ref 74–147)
i-STAT ACT Kaolin: 131 s (ref 74–147)
i-STAT ACT Kaolin: 335 s — ABNORMAL HIGH (ref 74–147)

## 2022-06-08 SURGERY — ABLATION - PVC'S
Anesthesia: Anesthesia General

## 2022-06-08 MED ORDER — PROPOFOL INFUSION 10 MG/ML
INTRAVENOUS | Status: DC | PRN
Start: 2022-06-08 — End: 2022-06-08
  Administered 2022-06-08: 50 ug/kg/min via INTRAVENOUS

## 2022-06-08 MED ORDER — ONDANSETRON HCL 4 MG/2ML IJ SOLN
INTRAMUSCULAR | Status: DC | PRN
Start: 2022-06-08 — End: 2022-06-08
  Administered 2022-06-08 (×2): 4 mg via INTRAVENOUS

## 2022-06-08 MED ORDER — PROTAMINE SULFATE 10 MG/ML IV SOLN
INTRAVENOUS | Status: AC
Start: 2022-06-08 — End: ?
  Filled 2022-06-08: qty 25

## 2022-06-08 MED ORDER — HEPARIN (PORCINE) IN NACL 1000-0.9 UT/500ML-% IV SOLN - TABLE FLUSH (SEDATION NARRATOR)
INTRAVENOUS | Status: AC | PRN
Start: 2022-06-08 — End: 2022-06-08
  Administered 2022-06-08: 2000 [IU]

## 2022-06-08 MED ORDER — ASPIRIN 325 MG PO TBEC
325.0000 mg | DELAYED_RELEASE_TABLET | Freq: Every day | ORAL | 0 refills | Status: DC
Start: 2022-06-08 — End: 2022-08-27

## 2022-06-08 MED ORDER — LIDOCAINE 4 % EX CREA
TOPICAL_CREAM | Freq: Once | CUTANEOUS | Status: DC | PRN
Start: 2022-06-08 — End: 2022-06-08

## 2022-06-08 MED ORDER — HEPARIN SODIUM (PORCINE) 1000 UNIT/ML IJ SOLN
INTRAMUSCULAR | Status: AC
Start: 2022-06-08 — End: ?
  Filled 2022-06-08: qty 20

## 2022-06-08 MED ORDER — LACTATED RINGERS IV SOLN
INTRAVENOUS | Status: DC | PRN
Start: 2022-06-08 — End: 2022-06-08

## 2022-06-08 MED ORDER — FENTANYL CITRATE (PF) 50 MCG/ML IJ SOLN (WRAP)
25.0000 ug | INTRAMUSCULAR | Status: DC | PRN
Start: 2022-06-08 — End: 2022-06-08

## 2022-06-08 MED ORDER — PROTAMINE SULFATE 10 MG/ML IV SOLN
INTRAVENOUS | Status: DC | PRN
Start: 2022-06-08 — End: 2022-06-08
  Administered 2022-06-08: 70 mg via INTRAVENOUS

## 2022-06-08 MED ORDER — LIDOCAINE HCL 1 % IJ SOLN
1.0000 mL | Freq: Once | INTRAMUSCULAR | Status: DC | PRN
Start: 2022-06-08 — End: 2022-06-08

## 2022-06-08 MED ORDER — PHENYLEPHRINE 100 MCG/ML IV BOLUS (ANESTHESIA)
PREFILLED_SYRINGE | INTRAVENOUS | Status: DC | PRN
Start: 2022-06-08 — End: 2022-06-08
  Administered 2022-06-08 (×2): 100 ug via INTRAVENOUS

## 2022-06-08 MED ORDER — FAMOTIDINE 10 MG/ML IV SOLN (WRAP)
INTRAVENOUS | Status: DC | PRN
Start: 2022-06-08 — End: 2022-06-08
  Administered 2022-06-08: 20 mg via INTRAVENOUS

## 2022-06-08 MED ORDER — PROPOFOL 10 MG/ML IV EMUL (WRAP)
INTRAVENOUS | Status: AC
Start: 2022-06-08 — End: ?
  Filled 2022-06-08: qty 20

## 2022-06-08 MED ORDER — PHENYLEPHRINE HCL-NACL 100-0.9 MG/250ML-% IV SOLN
INTRAVENOUS | Status: AC
Start: 2022-06-08 — End: ?
  Filled 2022-06-08: qty 250

## 2022-06-08 MED ORDER — LACTATED RINGERS IV SOLN
INTRAVENOUS | Status: DC
Start: 2022-06-08 — End: 2022-06-08

## 2022-06-08 MED ORDER — IODIXANOL 320 MG/ML IV SOLN
INTRAVENOUS | Status: AC | PRN
Start: 2022-06-08 — End: 2022-06-08
  Administered 2022-06-08: 40 mL via INTRAVENOUS

## 2022-06-08 MED ORDER — ONDANSETRON HCL 4 MG/2ML IJ SOLN
4.0000 mg | Freq: Once | INTRAMUSCULAR | Status: DC | PRN
Start: 2022-06-08 — End: 2022-06-08

## 2022-06-08 MED ORDER — LIDOCAINE HCL 1 % IJ SOLN
INTRAMUSCULAR | Status: AC | PRN
Start: 2022-06-08 — End: 2022-06-08
  Administered 2022-06-08: 10 mL

## 2022-06-08 MED ORDER — ACETAMINOPHEN 325 MG PO TABS
650.0000 mg | ORAL_TABLET | Freq: Once | ORAL | Status: DC | PRN
Start: 2022-06-08 — End: 2022-06-08

## 2022-06-08 MED ORDER — FENTANYL CITRATE (PF) 50 MCG/ML IJ SOLN (WRAP)
INTRAMUSCULAR | Status: AC
Start: 2022-06-08 — End: ?
  Filled 2022-06-08: qty 2

## 2022-06-08 MED ORDER — ONDANSETRON HCL 4 MG/2ML IJ SOLN
INTRAMUSCULAR | Status: AC
Start: 2022-06-08 — End: ?
  Filled 2022-06-08: qty 2

## 2022-06-08 MED ORDER — LIDOCAINE HCL 1 % IJ SOLN
INTRAMUSCULAR | Status: AC
Start: 2022-06-08 — End: ?
  Filled 2022-06-08: qty 20

## 2022-06-08 MED ORDER — HEPARIN SODIUM (PORCINE) 1000 UNIT/ML IJ SOLN
INTRAMUSCULAR | Status: DC | PRN
Start: 2022-06-08 — End: 2022-06-08
  Administered 2022-06-08: 4000 [IU] via INTRAVENOUS
  Administered 2022-06-08: 20000 [IU] via INTRAVENOUS

## 2022-06-08 MED ORDER — FENTANYL CITRATE (PF) 50 MCG/ML IJ SOLN (WRAP)
INTRAMUSCULAR | Status: DC | PRN
Start: 2022-06-08 — End: 2022-06-08
  Administered 2022-06-08 (×2): 25 ug via INTRAVENOUS

## 2022-06-08 MED ORDER — HEPARIN (PORCINE) IN NACL 2-0.9 UNIT/ML-% IJ SOLN (WRAP)
INTRAVENOUS | Status: AC
Start: 2022-06-08 — End: ?
  Filled 2022-06-08: qty 1500

## 2022-06-08 MED ORDER — PHENYLEPHRINE 100 MCG/ML IV SOSY (WRAP)
PREFILLED_SYRINGE | INTRAVENOUS | Status: AC
Start: 2022-06-08 — End: ?
  Filled 2022-06-08: qty 10

## 2022-06-08 MED ORDER — PROPOFOL 10 MG/ML IV EMUL (WRAP)
INTRAVENOUS | Status: AC
Start: 2022-06-08 — End: ?
  Filled 2022-06-08: qty 100

## 2022-06-08 MED ORDER — SODIUM CHLORIDE 0.9 % IV SOLN
INTRAVENOUS | Status: DC | PRN
Start: 2022-06-08 — End: 2022-06-08
  Administered 2022-06-08: 30 ug/min via INTRAVENOUS

## 2022-06-08 SURGICAL SUPPLY — 34 items
5 FR X 110CM, 2-5-2MM SPACING, 1MM TIP, LARGE CURVE, INQUIRY DECAPOLAR STEERABLE EP CATH (Catheter, EP) ×1 IMPLANT
CATH SOUNDSTAR GE 8F (Catheter) ×2
CATHETER ABLATION UNIDIRECTIONAL (Catheter) ×1 IMPLANT
CATHETER ABLATION UNIDIRECTIONAL THERMOCOUPLE IRRIGATE TIP ENHANCE (Catheter) IMPLANT
CATHETER ABLT D-F CRV THERMOCOOL (Catheter) ×2 IMPLANT
CATHETER ELECTROPHYSIOLOGY L110 CM OD5 (Catheter) ×1
CATHETER ELECTROPHYSIOLOGY L110 CM OD5 FR 5 MM SPACE 4 PIN FIX (Catheter) IMPLANT
CATHETER ELECTROPHYSIOLOGY OD7 FR 2-6-2 (Catheter) ×1
CATHETER ELECTROPHYSIOLOGY OD7 FR 2-6-2 MM D CURVE NAVIGATION (Catheter) IMPLANT
CATHETER EP 2-6-2MM D CRV PENTARAY 7FR (Catheter) ×2
CATHETER EP 5MM SPC WBSTR 5FR 110CM 4 (Catheter) ×2
CATHETER MAPPING EXTERNAL CARTO 3 (Catheter Miscellaneous) IMPLANT
CATHETER OD8 FR SOUNDSTAR ANGIOGRAPHIC (Catheter) IMPLANT
GUIDEWIRE VASC PTFE 3MM RDS J CRV .035IN (Guidwire) ×6
GUIDEWIRE VASCULAR OD.035 IN L150 CM 3 (Guidwire) ×3
GUIDEWIRE VASCULAR OD.035 IN L150 CM 3 MM RADIUS J CURVE FIX CORE PTFE (Guidwire) IMPLANT
INTRODUCER GUIDEWIRE S-MAK L10 CM OD4 (Sheaths) ×1
INTRODUCER GUIDEWIRE S-MAK L10 CM OD4 FR ODSEC21 GA STAINLESS STEEL (Sheaths) IMPLANT
INTRODUCER GUIDEWIRE S-MAKâ„¢ L10 CM OD4 FR ODSEC21 GA STAINLESS STEEL (Sheaths) ×1 IMPLANT
INTRODUCER GW SS .018IN MERIT S-MAK 4FR (Sheaths) ×2
INTRODUCER SHEATH OD8 FR L23 CM AVANTI+ (Sheaths) ×1
INTRODUCER SHEATH OD8 FR L23 CM AVANTI+ MID LENGTH BLUE (Sheaths) IMPLANT
INTRODUCER SHEATH OD9 FR L23 CM (Introducer) ×1
INTRODUCER SHEATH OD9 FR L23 CM RADIOPAQUE HEMOSTASIS VALVE BRITE TIP (Introducer) IMPLANT
INTRODUCER SHTH MID LGTH AVNT+ 8FR 23CM (Sheaths) ×2
INTRODUCER SHTH SIL BRTP 9FR 23CM LF (Introducer) ×2
PATCH EXTERNAL REFERENCE (Catheter Micellaneous) ×2
SET INTRO S ARWFLX 8FR 13.75IN LF 3W (Procedure Accessories) ×2
SET INTRODUCER L13 3/4 IN 3 WAY STOPCOCK TISSUE DILATOR OBTURATOR (Procedure Accessories) IMPLANT
SET SMARTABLATE IRRIG TUBE (Irrigation Solutions) ×2
SHEATH GD SS .038IN PNCL 8FR 10CM SNPON (Introducer) ×2
SHEATH GUIDING L10 CM .038 IN SNAP ON (Introducer) ×1
SHEATH GUIDING L10 CM .038 IN SNAP ON DILATOR LOCK KINK RESISTANT (Introducer) IMPLANT
TUBING ABLATION PUMP CARTO SMARTABLATE (Irrigation Solutions) IMPLANT

## 2022-06-08 NOTE — Procedures (Addendum)
Procedure details:     Patient presented to electrophysiology lab in  sinus rhythm with frequent PVC .  Sedation was provided by the anaesthesia, please see their note for details      One 8 and One 9 French sheath were placed in the right common femoral vein using micropuncture needle and ultrasound.      RCFA access was then obtained using fluoroscopy, micropuncture needle and USG guidance and a long 765F sheath was then placed in the RCFA.     ICE catheter was advanced to RA and a 3D Carto sound  map of the aortic cusps and MA was  made. After this Biosense Webster 3.5 mm smart touch D-F ablation nablation catheter was advanced under 3-D mapping guidance to the right atrium.A 3-D map of the right atrium, SVC, IVC ,RVOT and coronary sinus was then made.  We tried to advance a 65F decapolar catheter to GCV but were unsuccessful due to tortuosity and it was left in the CS        Patient was having frequent PVC's        PVC Morphology:  RBBB RIGHT INFERIOR AXIS        ACTIVATION MAPPING   Activation and pace mapping was then done for the PVC.  A Biosense [employer] cathter was advanced to Aortic Cusps, then LV and an activation map was done, Earliest activation was located at anterior Mitral Annulus which was 10 msec pre QRS. Pace mapping here showed a 95-97%pace match        ABLATION :  Ablation was then performed at this spot at  40W for duration of  60-90seconds.  With this there was IMMEDIATE SUPPRESSION OF PVC   A few extra lesions were given around the spot.  The catheter was then into Aorta.   There was no recurrence of PVCs.    We then waited for 20 minutes without any PVC    Procedure was then considered complete.  Catheters were withdrawn. Heparin was then reversed with Protamine.   Groin sheath were removed and manual compression held to achieve hemostasis     Conclusion:  1) Successful Ablation of PVC from [facility]   2) Aspirin 325mg  po daily for 1 months  3) F/u in clinic with Dr.Wish after a holter

## 2022-06-08 NOTE — Transfer of Care (Signed)
Anesthesia Transfer of Care Note    Patient: Hailey [name]    Procedures performed: Procedure(s) with comments:  Ablation - PVC's - SAME DAY DISCHARGE  CARTO NO TEE    Anesthesia type: General TIVA    Patient location:[facility]    Last vitals:   Vitals:    06/08/22 1204   BP: 132/77   Pulse: 80   Resp: 22   Temp: 36.7 C (98 F)   SpO2: 98%       Post pain: Patient not complaining of pain, continue current therapy      Mental Status:awake and alert     Respiratory Function: tolerating room air    Cardiovascular: stable    Nausea/Vomiting: patient not complaining of nausea or vomiting    Hydration Status: adequate    Post assessment: no apparent anesthetic complications and no reportable events    Signed by: Delana Meyer, CRNA  06/08/22 12:04 PM

## 2022-06-08 NOTE — Progress Notes (Signed)
The history and physical currently available in the chart has been reviewed and there are no significant interval changes since prior evaluation.  She has no complaints.  She was seen and examined by me prior to the procedure.  The risks, benefits and alternatives of the procedure have been discussed in detail and she has indicated that she understands the procedure, indications, and risks inherent to the procedure and is amenable to proceeding.  All questions were answered. Informed consent was signed and verified.      Procedure : PVC Ablation    Indication: PVC/ CARDIOMYOPATHY

## 2022-06-08 NOTE — Nursing Progress Note (Signed)
Outpatient  from home to [facility] room 3 for PVC's Ablation with Dr. Lucianne Muss. ID band, NPO status, and allergies verified. Vital signs stable. Patient AOX4. Denies pain at this time. Spouse at bedside. Peripheral IV inserted/flushed. Procedure and recovery expectation explained to patient and spouse at bedside. Call bell within reach; will continue to monitor care and maintain safety.

## 2022-06-08 NOTE — Anesthesia Postprocedure Evaluation (Signed)
Anesthesia Post Evaluation    Patient: Hailey [name]    Procedure(s) with comments:  Ablation - PVC's - SAME DAY DISCHARGE  CARTO NO TEE    Anesthesia type: general    Last Vitals:   Vitals Value Taken Time   BP 132/77 06/08/22 1204   Temp 36.7 C (98 F) 06/08/22 1204   Pulse 80 06/08/22 1204   Resp 16 06/08/22 1215   SpO2 98 % 06/08/22 1204                 Anesthesia Post Evaluation:     Patient Evaluated: PACU  Patient Participation: complete - patient participated  Level of Consciousness: awake and alert  Pain Score: 0  Pain Management: adequate  Multimodal analgesia pain management approach    Airway Patency: patent    Anesthetic complications: No      PONV Status: none    Cardiovascular status: acceptable  Respiratory status: acceptable  Hydration status: acceptable        Signed by: Lovena Neighbours, MD, 06/08/2022 12:57 PM

## 2022-06-08 NOTE — Progress Notes (Signed)
EP ABLATION PROCEDURE HANDOFF REPORT    Date Time: 06/08/22 11:16 AM    INDICATION   PVCs  PROCEDURE:    PVC ablation  ALLERGIES:    Fruit blend flavor [flavoring agent], Latex, Adhesive [wound dressing adhesive], Other, and Oxycodone   MEDICAL HISTORY:      Past Medical History:   Diagnosis Date    Abnormal Pap smear of cervix     Behcet's disease     She sees Dr. Adin Hector    BMI 45.0-49.9, adult     Diarrhea     occasional // poss. r/t meds    Headache     on Rx    Lower back pain     Meningitis 2011    Viral // treated and resolved    Neck pain     PT// Improved    Post-operative nausea and vomiting     PVC (premature ventricular contraction)     Scoliosis     moderate// used a Brace at a young age      ACCESS:    30 F sheath in right femoral artery  8 F, 9 F sheath in right femoral vein  Hemostasis: Manual pressure for RFA, Manual pressure and Figure of 8 for RFV   Post procedure pulses: palpable  Visual appearance: clean/dry/intact   MEDICATIONS:    Protamine 70 mg    See Anesthesia report for anesthesia meds  VITALS:    BP: 120/ 60      HR: 79      O2 SAT: 99%       Rhythm:   Sinus rhythm  PROCEDURE DETAILS:    See physician procedure note    Last ACT: 131  i-STAT ACT Kaolin   Date/Time Value Ref Range Status   06/08/2022 10:42 AM 335 (H) 74 - 147 sec Final        Outcomes: PVC ablation                               Complications: None               Report given to: [employer]

## 2022-06-08 NOTE — Progress Notes (Signed)
PT discharged home, A/O x4 denies pain, N/V, SOB or dizziness. VS WDL. Right femoral sites dry, clean and intact without signs of hematoma or bleeding. PT ambulated twice in hallway and used restroom without difficulty. Discharge instructions given to PT and family member.

## 2022-06-08 NOTE — Procedures (Signed)
FINAL        Name:            Pedro, [place]enburg               Age            41                   Study Date       06/08/2022                           Gender         Female               MRN              [id number]                           Race           Caucasian            EP Study No      [id number]                           Ht(in)         68.1                 Account No.      [id number]                        Wt(lbs)        312.0                Date of Birth    July 08, 1981                         BSA            2.50                    CASE INFORMATION  Primary Physician:                 Kumar,Vineet,M.D.                                 Anesthesia Type:                   IV Anesthesia                                     Fluoro Time (min):                 15.9                                                 EP Procedures Performed  Ablation PVC  Catheter Locations  Intracardiac Site               Sheath Size         Catheter Type         Access Site           CS                                                  Deca                  RFV                   Arterial                                                                  RFA                   ICE                                                 ICE                   RFV                      Procedural Medications  Medication                 Time             Dose            Unit             Route                       1% Lidocaine               9:30:52 AM       10              ml               Subcut                      Hydrochloride                                                                                            Heparin                    9:47:46 AM       20000  units            IV                          Heparin                    10:22:59 AM      4000            units            IV                          Protamine                  11:02:40  AM      70              mg               IV                             All medications given under the direct verbal order of MD with staff verification.     Contrast Agent          Amount      Units       Visipaque (V047)              40          ml             All contrast given under the direct verbal order of MD with staff verification.           _____________________________________      Kumar,Vineet,M.D.     Pre Operative Diagnoses  Premature Ventricular Contractions                                                           Post Operative Diagnoses  Premature Ventricular Contractions                                                           ACT  Time of ACT            Seconds to Clot          10:10:09 AM            299                      10:46:12 AM            335                      11:24:22 AM            131                         Procedural Staff  Duty  Name                                     Electrophysiologist             Kumar,Vineet,M.D.                        CRNA                            Leodis Binet                               Anesthesiologist                Karthic R Kumaran                        Monitor                         Radonna Ricker                              Monitor                         Wintana Tassie                           Scrub                           Junie Bame Bereznitskaia                     Circulator                      Jolayne Panther                                    Event Log  Time               Note                                                                            08:33              Protocol: Baseline                                                              08:48              ______Pre-Case Documentation______  08:52              *****Procedural Staff*******                                                    08:52              .                                                                                08:56              ***PT ARRIVES IN ROOM***                                                        08:56              .                                                                               08:57              BOARDING PASS DATA REVIEWED AND VERIFIED                                        08:57              Consent Signed \\T \ On Chart                                                     08:57              Pre-Procedure Teaching Performed                                                08:57              Patient Verbalized Understanding of Procedure                                   08:57              Mallampatti score and ASA score completed  08:57              H \\T \ P On Chart                                                                08:57              .                                                                               08:57              Armband Checked \\T \ On Patient                                                  08:57              Lab Results Checked                                                             08:57              _________IV FLUIDS / SITE________                                               08:57              IV's Infusing:                                                                  08:57              IV Site Location:                                                               08:57              IV site checked; no edema and erythema  08:57              _______ALLERGY ASSESMENT____________                                            08:57              Patient Allergies: See chart                                                    09:00              ________PT. POSITION / VITALS_____________                                      09:00              Patient on table in supine position                                             09:00              Defib pads on                                                                    09:00              Grounding Pad \\T \ Foam Padding (elbows, hands, heels, head)                     09:00              Arms supported on sleds with padding                                            09:00              Wrist "Posey" on for safety                                                     09:00              Body warming blanket on                                                         09:00              _______ANESTHESIA CASES__________  09:00              Anesthesia, present upon patient arrival                                        09:00              Anesthesia Type - General/ IV Anesth. / IV Sedation/ LMAC/ LOC                  09:00              Temperature monitored by anesthesia                                             09:00              Vitals monitored by anesthesia. NBP \\T \ O2 Sat.                                 09:00              ________PATIENT PREP_________                                                   09:00              Pre-Op Skin Condition: C,D,I                                                    09:01              Pre-Procedure Pulses Checked                                                    09:06              Bovie Site: Bilateral lower back                                                09:06              PVC 1                                                                           09:08              Pre Case Conscious Sedation Assessment  09:08              Physician Arrived                                                               09:23              Right \\T \ left groin clipped/prepped                                            09:23              *----------EP PROCEDURE----------*                                              09:23              Patient prepped and draped in a sterile fashion by: YB                          09:28               Pre procedure pause completed                                                   09:28              FIRE SAFETY REVIEWED                                                            09:28              Boarding Pass Completed                                                         09:28              .                                                                               09:30              1% Lidocaine Hydrochloride Subcut 10 ml  09:31              ******LOCAL GIVEN SUB-Q******                                                   09:31              .                                                                               09:31              Case Event Type:Interventional EP,Physician:KUMAR,VINEET                        09:31              ******CASE START******                                                          09:31              .                                                                               09:31              Phase: Baseline                                                                 09:31              ******CASE EVENT******                                                          09:31              ******PRINT BASELINE EKG*******                                                 09:31              12-Lead  Print                                                                   09:35              Ultrasound guided access                                                        09:35              * Micro-puncture* access to: RFV x2                                             09:37              Ultrasound guided access                                                        09:37              * Micro-puncture* access to: RFAx1                                              09:38              --------------------------SHEATH INSERTION------                                09:39              __8,9_F sheath inserted into RFV                                                 09:39              Contrast shot of arterial access completed                                      09:40              Contrast: Visipaque (V047) 40 mL                                                09:41              _8__F sheath inserted into RFA  09:43              -------------------------CATHETER  PLACEMENT--------                            09:47              Heparin IV 20,000 units                                                         09:56              AH 79 HV 53                                                                     09:57              Catheter inserted into Coronary Sinus for recording                             10:04              ACT drawn and  running                                                          10:05              IntraCardiacEcho catheter inserted                                              10:07              Pentaray advanced for 3D mapping with BioSense                                  10:10              ******************************************                                      10:10              ****** POC ****** POC ****** POC ****** POC ******                              10:10              ******************************************  10:10              ---------- ACT RESULTS ----------                                               10:10              ACT: 299  secs; performed by: SA                                                10:10              Normal Range: 85-120 secs                                                       10:10              Therapeutic Range: 200-250 secs                                                 10:10              Panic Value: >300 secs                                                          10:10              Physician Notified                                                              10:12              Resultant therapy changes: 4000units  of Heparin                                 10:22              Heparin IV 4,000 units                                                          10:31              p2  10:40              ACT running                                                                     10:46              ---------- ACT RESULTS ----------                                               10:46              ACT: 335  secs; performed by: DH \\T \ WT                                         10:46              Normal Range: 85-120 secs                                                       10:46              Therapeutic Range: 200-250 secs                                                 10:46              Panic Value: >300 secs                                                          10:46              Physician Notified                                                              10:46              No action required at this time                                                 10:54              Wait period post ablation began  11:01              22F long exchanged for short 22F for angiogram                                    11:02              Protamine IV 70 mg                                                              11:04              Angiogram of the RFA completed                                                  11:07              No effusion on ICE per MD                                                       11:09              Catheters removed from body                                                     11:09              Procedure completed                                                             11:09              ***CASE END***                                                                  11:09              ***CASE END***                                                                  11:10  ______POST CASE Documentation______                                             11:10              Sheath pulled and Figure of eight suture put in for RFV closure                 11:11              Procedure: ABLATION - PVC's                                                     11:20              ACT running befor pulling RFA sheaths                                           11:24              ---------- ACT RESULTS ----------                                               11:24              ACT: 131  secs; performed by: SA                                                11:24              Normal Range: 85-120 secs                                                       11:24              Therapeutic Range: 200-250 secs                                                 11:24              Panic Value: >300 secs                                                          11:24              Physician Notified  11:24              No action required at this time                                                 11:25              Sheaths Pulled \\T \ Manual Pressure Applied for the RFA                          11:32              \R\\R\\R\PLEASE ENTER X-RAY INFO\R\\R\\R\                                       11:32              AK mGY= 497                                                                     11:44              Hemostasis Obtained                                                             11:44              Post Procedure Pulses Checked                                                   11:44              Bovie site intact                                                               11:44              Bovie pad removed                                                               11:44              Bovie site checked  11:49              Report Called To: [facility] RN Thayer Ohm                                                  11:49              Patient to be transferred to: [facility] BED 23                                       11:50              Post Procedure Teaching to be Performed in [facility]                                 11:50              ******PT LEAVES ROOM*****                                                       11:50              .                                                                               11:51              ******MASTER LIST REVIEW******                                                  11:51              Complication: No complications                                                  11:51              ******MASTER LIST REVIEW******                                                     _________________________________  Staff Signature

## 2022-06-08 NOTE — Discharge Instr - AVS First Page (Addendum)
Interventional Cardiovascular Admission and Recovery  EP Discharge Instructions  Ablation    ACTIVITY:  For the first 24 hours after your procedure: No driving following your procedure due to medications you may have received (have an adult family member or friend drive you home after the procedure). Don't make important decisions or sign legal documents. Don't drink alcohol.  Rest today and tomorrow, gradually resuming your usual activities.  Limit stair usage for the next 24 hours. If you must use the stairs, take one stair at a time leading with the unaffected leg.  Support the puncture site/s with your hand.  Do not lift anything over 10 pounds and you should not do any strenuous activities for at least five (5) days.  That includes pushing, pulling, dragging or moving anything.  Ask the doctor when you may resume strenuous activity  Support the bandaged site when coughing or sneezing. Do not strain when having a bowel movement.   Ask the doctor when you may return to work.    If you received anesthesia:   Common side effects after anesthesia:   Nausea and vomiting  Sore throat or hoarseness (usually temporary)   Urinary retention - If you are experiencing difficulty urinating, please call your physician or go to the nearest emergency room.    Managing nausea  Some people have an upset stomach after the procedure/surgery. This is often because of anesthesia, pain, or pain medicine, or the stress of the procedure/surgery. These tips may help you handle nausea:  Don't push yourself to eat. Your body will tell you when to eat and how much.  Start off with clear liquids and soup. They are easier to digest. Then progress to solid foods as tolerated.  If pain medications were prescribed, they may upset your stomach (It may help to take with some food).    WOUND/INCISION CARE:  You may shower 24 hours after your procedure. REMOVE the bandage/s, let the water passively flow over the site/s, wash gently with your hand,  pat the area dry with a clean towel. Leave site/s open to air. Do not submerge site in water (tub bath, pool, etc.) until area is completely healed.  Do not apply any creams, powders, lotions or ointments to the site/s.  Do not rub, scrub, pick, scratch or even use a wash cloth over the site/s.  Observe for signs of infection: redness, warmth, swelling, drainage or if you have a temperature greater than 100.4 degrees F. If you suspect infection, call the doctor who performed the procedure.  You may have some tenderness. May take ACETAMINOPHEN (Tylenol) if needed.  You may experience some mild bruising.  If you notice bleeding either at the incision site or underneath the skin, you should lay down flat and hold pressure on the area for 10 minutes and call your doctor.  If the bleeding does not stop, you should continue to hold pressure and call 9-1-1.  If your leg becomes cold, numb, painful, grayish in color, or change from usual color/sensation occurs, you should call 9-1-1.   If you have trouble breathing or facial swelling, call 9-1-1 right away.    WHEN TO CALL THE DOCTOR:  1.  If you suspect infection.  2.  Rapid beating of the heart for more than 30 seconds.  3.  Chest pain, shortness of breath, dizziness or lightheadedness while having palpitations.  4.  Chest discomfort or intolerable pain.  5.  Difficulty swallowing  6.  Coughing up blood     7.  Rash, itching, or hives may mean you have an allergic reaction. Report this right away.  Note: Ask your doctor what to expect about your heart rate / rhythm.  Sometimes the irregularity goes away immediately after the procedure, other times it may take longer to subside.

## 2022-06-09 ENCOUNTER — Encounter: Payer: Self-pay | Admitting: Cardiovascular Disease

## 2022-06-13 ENCOUNTER — Encounter (INDEPENDENT_AMBULATORY_CARE_PROVIDER_SITE_OTHER): Payer: Self-pay | Admitting: Clinical Cardiac Electrophysiology

## 2022-06-13 ENCOUNTER — Telehealth: Payer: Self-pay

## 2022-06-13 NOTE — Progress Notes (Signed)
Procedure note says to follow up with MW after holter, but does not specify when to wear the holter. MW also is not available until November. Please advise.

## 2022-06-13 NOTE — Telephone Encounter (Signed)
Botox 200 units:     Botox will be ready on 8/9 for patient's appt for 8/16 you can pick up from [facility]

## 2022-06-15 ENCOUNTER — Ambulatory Visit: Payer: BLUE CROSS/BLUE SHIELD | Admitting: Family Nurse Practitioner

## 2022-06-22 ENCOUNTER — Encounter: Payer: Self-pay | Admitting: Family Nurse Practitioner

## 2022-06-22 ENCOUNTER — Ambulatory Visit: Payer: BLUE CROSS/BLUE SHIELD | Attending: Family Nurse Practitioner | Admitting: Family Nurse Practitioner

## 2022-06-22 VITALS — BP 162/84 | HR 93 | Temp 99.0°F | Resp 12 | Ht 68.0 in | Wt 312.0 lb

## 2022-06-22 DIAGNOSIS — M352 Behcet's disease: Secondary | ICD-10-CM

## 2022-06-22 DIAGNOSIS — G43109 Migraine with aura, not intractable, without status migrainosus: Secondary | ICD-10-CM | POA: Insufficient documentation

## 2022-06-22 DIAGNOSIS — G43709 Chronic migraine without aura, not intractable, without status migrainosus: Secondary | ICD-10-CM | POA: Insufficient documentation

## 2022-06-22 MED ORDER — ONABOTULINUMTOXINA 200 UNITS IJ SOLR
155.0000 [IU] | Freq: Once | INTRAMUSCULAR | Status: AC
Start: 2022-06-22 — End: 2022-06-22
  Administered 2022-06-22: 155 [IU] via INTRAMUSCULAR

## 2022-06-22 NOTE — Patient Instructions (Addendum)
Our plan:     -You were injected with Botox today    -You may have pain and discomfort at the injection sites    -Please do not rub the injection sites    -Common side effects include headache pain, neck pain, or weakness    -If you have any side effects of concerns, please call the office and let me know    -Please track your headache days using a headache log and bring that information to your follow up visit    -Please return in 12 weeks for repeat Botox injection     -- 155 units of Botox administered today, patient tolerated procedure well  -- continue  Nurtec 75mg  every other day for additional migraine prevention  -- for mild pain use anti-inflammatories (naproxen/ ibuprofen)   -- For moderate headaches take oral rizatriptan 10mg ...  -- patient counseled on limiting triptans to less than 10 days/month       Discussed the importance of lifestyle modifications:  - sleep hygiene ( keep a regular sleep schedule) maintaining appropriate hydration,   - avoid overuse of caffeine,   - eat regular meals - minimizing processed foods and emphasizing lean proteins and fruits and vegetables.     --Keep headache diary, record frequency of headache, location, duration, intensity, preceding symptoms (aura occurrence), triggers and pain relieving measures.  -- recommend Migraine Buddy app     -Remember to limit acute headache treatment medicines (like triptans or anti-inflammatories) to 2-3 days/week to avoid medication-overuse headache.         For migraine education resources:  American Headache Society: https://americanheadachesociety.org  American Migraine Foundation: https://americanmigrainefoundation.org    Today's Visit:      In today's visit, I reviewed your medications and records relating your health - prior testing, blood work, reports of other health care providers present in your electronic medical record.     If you have pertinent records from any non-[place] doctors that you would like to review, please have them  sent to [place] or bring to them to your next office visit.     A copy of today's visit will be sent to your referring doctor and/or primary care doctor, if you have one listed in our system.      Patient satisfaction survey:      If you receive a patient satisfaction survey, I would greatly appreciate it if you would complete it. We value your feedback.     Contact me online:      Patient Portal online - Please sign up for MyChart -- this is the best way to communicate with your team here.  There is a messaging feature, where you can [place] messages anytime of day.  It is the best way to communicate with [place] and get test results, medication refills, or ask questions.    If you are having a medical emergency -- call 911, DO NOT SEND A MESSAGE THROUGH MYCHART.     Coupons for medication:      If you have any trouble affording your medications, check out www.goodrx.com for coupons and competitive prices in your area.  If you need further assistance, let us know so we can work with you and your insurance to make sure you get the best care.    Lowanda Foster FNP-C  [facility]   [facility]: [phone])- [phone]  [place]: [phone])- [phone]

## 2022-06-22 NOTE — Progress Notes (Signed)
Botox 200 units witness by Armstead Leandra FNP  Botox 200 units mixed with 4 ml N/S  draw in each  1 ml syringe and 30 G. Needle order read back and verified to Armstead Leandra FNP

## 2022-06-22 NOTE — Progress Notes (Signed)
06/22/2022      CC: migraine    History was obtained from patient   History of Present Illness:  Hailey [name] is a 41 y.o. 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     06/22/22    Patient comes in today for her second Botox treatment for chronic migraine  Today patient tells me she is noted a decrease in the severity of her migraines but reports about the same frequency  Patient had some mild pathergy after last botox administration  She estimates her 30 day headache frequency/severity profile is about 10-15/several   2 weeks ago she had a  cardiac cath for an ablation due to frequent ventricular ectopy  Patient continues Nurtec QOD and is using rizatriptan as needed       Mar 23, 2022  She presents for Botox injections. No other issues or conerns.     Notes reviewed: Rheumatology message re: pathergy and Botox injections    Mar 15 2022  Headache hx: since college  Location:bilateral and band like and left eye  Described as sharp and throbbing  Occurs:>15 HA days per month  Duration:>4 hours  Intensity: moderate to severe  Aura:none  Associated with photo/phonophobia, nausea and dizziness  Denies focal motor/sensory or visual changes.   No positional or autonomic symptoms  Exacerbating factors:unknown  Alleviating factors:unknown  Sleep:OK  Mood:high, working in coping mechanisms    Past medications tried:  Wellsite geologist  Rizatriptan  imitrex  buspar,   Topiramate: SE  reglan,   Propranolol ineffective  hydroxyzine      HIT6:62    Records Reviewed:  Dr. Thayer Jew Progress notes  Dr. Albertine Grates Cardiology notes  Dr.Dave notes for headache      Past Medical History:  Past Medical History:   Diagnosis Date    Abnormal Pap smear of cervix     Behcet's disease     She sees Dr. Adin Hector    BMI 45.0-49.9, adult     Diarrhea     occasional // poss. r/t meds    Headache     on Rx    Lower back pain     Meningitis 2011    Viral // treated and resolved    Neck pain     PT//  Improved    Post-operative nausea and vomiting     PVC (premature ventricular contraction)     Scoliosis     moderate// used a Brace at a young age       Past Surgical History:  Past Surgical History:   Procedure Laterality Date    ABLATION - PVC'S N/A 06/08/2022    Procedure: Ablation - PVC's;  Surgeon: [name], MD;  Location: [facility];  Service: Cardiovascular;  Laterality: N/A;  SAME DAY DISCHARGE  CARTO NO TEE    ARTHROTOMY, WRIST Right 12/03/2020    Procedure: RIGHT UPPER EXTREMITY SYNOVIAL BIOPSY;  Surgeon: Kellie Simmering, MD;  Location: [facility];  Service: Plastics;  Laterality: Right;    BIOPSY, LYMPH NODE N/A 07/23/2018    Procedure: BIOPSY, LYMPH NODE;  Surgeon: Shelda Pal, MD;  Location: [facility];  Service: ENT;  Laterality: N/A;  NECK DEEP LYMPH NODE BIOPSY    BONE MARROW BIOPSY  2020    CARDIAC ABLATION  2005    [facility] for Wolff-Parkinson[facility]    CHOLECYSTECTOMY  2009    [facility] DIAGNOSTIC  2010  DILATION AND CURETTAGE OF UTERUS  2010    FINGER GANGLION CYST EXCISION  04/2018    GANGLION CYST EXCISION  2014    04/2018 cyst removed from index finger-[facility]    HERNIA REPAIR  2016    HYSTERECTOMY  2012    PELVIC LAPAROSCOPY  2011    UMBILICAL HERNIA REPAIR  2016    UPPER GASTROINTESTINAL ENDOSCOPY  2009    WISDOM TOOTH EXTRACTION  1998       Allergies:  Fruit blend flavor [flavoring agent], Latex, Adhesive [wound dressing adhesive], Other, and Oxycodone    Family History:  Family History   Adopted: Yes     No other family history related to current complaints.    Social History:  Social History     Tobacco Use    Smoking status: Never    Smokeless tobacco: Never   Vaping Use    Vaping Use: Never used   Substance Use Topics    Alcohol use: Not Currently     Alcohol/week: 0.0 standard drinks of alcohol     Comment: rarely    Drug use: Never       Medications:  Current Outpatient Medications   Medication Sig Dispense Refill    Adalimumab 40  MG/0.4ML Pen-injector Kit Inject 40 mg into the skin every 10 (ten) days (Patient taking differently: Inject 0.4 mLs (40 mg) into the skin every 10 (ten) days) 6 each 1    Apremilast (Otezla) 30 MG Tab Take 1 tablet (30 mg) by mouth 2 (two) times daily (Patient taking differently: Take 1 tablet (30 mg) by mouth 2 (two) times daily) 180 tablet 1    aspirin EC 325 MG tablet Take 1 tablet (325 mg) by mouth daily 30 tablet 0    Azelastine-Fluticasone 137-50 MCG/ACT Suspension 1 spray by Each Nare route 2 (two) times daily as needed      botulinum Toxin Type A (Botox) 200 units Recon Soln injection Inject 155 units IM every 12 weeks 1 each 3    busPIRone (BUSPAR) 5 MG tablet TAKE 1 TABLET (5 MG) BY MOUTH 3 (THREE) TIMES DAILY AS NEEDED (ANXIETY) 270 tablet 0    clindamycin (CLEOCIN T) 1 % lotion Apply topically every morning      colesevelam (WELCHOL) 625 MG tablet Take 1 tablet (625 mg) by mouth 2 (two) times daily with meals (Patient taking differently: Take 2 tablets (1,250 mg) by mouth every morning) 180 tablet 3    Dapsone 7.5 % Gel Apply topically every morning      EPINEPHrine (EPIPEN 2-PAK) 0.3 MG/0.3ML Solution Auto-injector injection Inject 0.3 mLs (0.3 mg) into the muscle once as needed (Anaphylaxis) ; go to ER after injection 1 each 1    fexofenadine (Allegra Allergy) 180 MG tablet Take 1 tablet (180 mg) by mouth daily      hydrOXYzine (ATARAX) 10 MG tablet Take 1 tablet (10 mg) by mouth nightly 90 tablet 3    lidocaine-prilocaine (EMLA) cream Apply topically as needed Dermatology procedures      Multiple Vitamins-Minerals (MULTIVITAMIN ADULT PO) Take by mouth nightly      Omega-3 1000 MG Cap Take by mouth nightly      ondansetron (ZOFRAN-ODT) 4 MG disintegrating tablet TAKE 1 TABLET (4 MG) BY MOUTH EVERY 8 (EIGHT) HOURS AS NEEDED FOR NAUSEA 30 tablet 2    Rimegepant Sulfate 75 MG Tablet Dispersible Take 1 tablet (75 mg) by mouth every other day (Patient taking differently: Take 1 tablet (  75 mg) by mouth  every other day Night) 16 tablet 5    rizatriptan (MAXALT) 10 MG tablet Take 1-2 tablets (10-20 mg total) by mouth once as needed for Migraine Max 3 days a week 36 tablet 3    vitamin D (CHOLECALCIFEROL) 25 MCG (1000 UT) tablet Take 1 tablet (1,000 Units) by mouth nightly       Current Facility-Administered Medications   Medication Dose Route Frequency Provider Last Rate Last Admin    botulinum Toxin Type A (BOTOX) injection 155 Units  155 Units Injection Once Nahsir Venezia, Golden Pop, FNP           General Exam:  BP 162/84 (BP Site: Left arm, Patient Position: Sitting, Cuff Size: Medium)   Pulse 93   Temp 99 F (37.2 C)   Resp 12   Ht 1.727 m (5\' 8" )   Wt 141.5 kg (312 lb)   LMP  (LMP Unknown)   SpO2 97%   BMI 47.44 kg/m     PHYSICAL EXAM:    Gen: pleasant, NAD. Appears stated age.    HEENT: MMM, OP clear, sclera non-icteric    Respiratory: no respiratory distress; normal rhythm and effort.     MS: alert, appropriate, speech fluent, follows commands    CN: EOM intact, PERRL 3-->36mm, face symmetric, hearing intact to voice.     No lateralizing features. No pronator drift.    Gait normal    Investigations:      Imaging:  MRI Brain W WO Contrast    Result Date: 04/30/2021  Impression:  1. No intracranial abnormality is detected. MR imaging of the brain is stable with 08/13/2020. 2. There is no lesion within the spinal cord. 3. Mild cervical, thoracic and lumbar spondylosis as discussed above. Electronically signed by: Otho Ket D.O.  [Interpreted at: [facility] BG: 04/30/21    MRI Cervical Spine W WO Contrast    Result Date: 04/30/2021  Impression:  1. No intracranial abnormality is detected. MR imaging of the brain is stable with 08/13/2020. 2. There is no lesion within the spinal cord. 3. Mild cervical, thoracic and lumbar spondylosis as discussed above. Electronically signed by: Otho Ket D.O.  [Interpreted at: [facility] BG: 04/30/21    MRI  Lumbar Spine W WO Contrast    Result Date: 04/30/2021  Impression:  1. No intracranial abnormality is detected. MR imaging of the brain is stable with 08/13/2020. 2. There is no lesion within the spinal cord. 3. Mild cervical, thoracic and lumbar spondylosis as discussed above. Electronically signed by: Otho Ket D.O.  [Interpreted at: [facility] BG: 04/30/21    MRI Thoracic Spine W WO Contrast    Result Date: 04/30/2021  Impression:  1. No intracranial abnormality is detected. MR imaging of the brain is stable with 08/13/2020. 2. There is no lesion within the spinal cord. 3. Mild cervical, thoracic and lumbar spondylosis as discussed above. Electronically signed by: Otho Ket D.O.  [Interpreted at: [facility] BG: 04/30/21        Procedure Note:  BOTOX injection is indicated for the prophylaxis of headaches in adult patients with chronic migraine.  Patient meets indications for BOTOX therapy.   Potential risks and benefits were reviewed.  Side effects including, but not limited to, potential systemic allergic reactions of the anaphylactic type as well as local injection site reactions of blepharoptosis, diplopia, infection, bleeding, pain, redness and bruising were reviewed.  The  potential for headaches and/or neck pain post procedure were reviewed.   The patient's questions were answered.  The patient signed a consent form.  Patient understands that depending on their insurance carrier, there may be a copay for this treatment.   BOTOX was reconstituted using 200 units diluted with 4 mL of sterile saline.  BOTOX was injected as per the PREEMPT trial injection paradigm with dose administered as 5 unit intramuscular (IM) injections per site using a sterile, 30-gauge 0.5 inch needle as follows:     Muscle    Dose, # of Sites   Corrugator   10 units divided in 2 sites     Procerus   5 units in 1 site     Frontalis   20 units divided in 4  sites     Temporalis   40 units divided in 8 sites     Occipitalis   30 units divided in 6 sites     Cervical paraspinal  20 units divided in 4 sites     Trapezius   30 units divided in 6 sites    Each site was cleaned with alcohol prior to injection.  A total dose of 155 units were injected.  45 units were discarded/wasted. The patient tolerated the procedure well with no immediate complications.     Medication Info:  [facility] [id number]  Lot # [id number]  Exp 12/2024      Impression:    Hailey [name] is a 41 y.o. 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  She has headaches >15 days per month and at least 8 of these are migrainous.  Migraines last >4 hours if untreated.  This pattern has continued for >3 months.  She has failed at least two preventive medications.      -- patients notes an improvement in migraine severity after her first botox treatment. I therefore recommended a continuation  of Botox via the PREEMPT protocol for migraine prophylaxis.      Plan/ Recommendations:    1. Chronic migraine without aura without status migrainosus, not intractable  - botulinum Toxin Type A (BOTOX) injection 155 Units    2. Migraine with aura and without status migrainosus, not intractable  - botulinum Toxin Type A (BOTOX) injection 155 Units    -- 155 units of Botox administered today, patient tolerated procedure well  -- continue  Nurtec 75mg  every other day for additional migraine prevention  -- for mild pain use anti-inflammatories (naproxen/ ibuprofen)   -- For moderate headaches take oral rizatriptan 10mg   -- patient counseled on limiting triptans to less than 10 days/month     Discussed the importance of lifestyle modifications:  - sleep hygiene ( keep a regular sleep schedule) maintaining appropriate hydration,   - avoid overuse of caffeine,   - eat regular meals -  minimizing processed foods and emphasizing lean proteins and fruits and vegetables.     --Keep headache diary, record frequency of headache, location, duration, intensity, preceding symptoms (aura occurrence), triggers and pain relieving measures.  -- recommend Migraine Buddy app     -Remember to limit acute headache treatment medicines (like triptans or anti-inflammatories) to 2-3 days/week to avoid medication-overuse headache.      The patient should seek emergent care if there is any change in the symptoms. Proper use and all side effects of medications discussed  Please contact me with any questions. Patients and [place] Providers can reach me via MyChart.    Dr. Fraser Din was available in a supervisory role    Lowanda Foster FNP-C  [facility]   [facility]: [phone])- [phone]  [place]: [phone])- [phone]

## 2022-06-23 NOTE — Telephone Encounter (Signed)
Closing encounter

## 2022-07-04 ENCOUNTER — Ambulatory Visit (INDEPENDENT_AMBULATORY_CARE_PROVIDER_SITE_OTHER): Payer: BLUE CROSS/BLUE SHIELD

## 2022-07-04 DIAGNOSIS — I493 Ventricular premature depolarization: Secondary | ICD-10-CM

## 2022-07-04 DIAGNOSIS — R002 Palpitations: Secondary | ICD-10-CM

## 2022-07-04 NOTE — Progress Notes (Signed)
Vital connect monitor applied to patient. Instructions given and all questions answered. Patient aware to send phone and charger back in pouch via UPS.    Device name: [id number]   Patch ID number: [id number]

## 2022-07-14 ENCOUNTER — Encounter (INDEPENDENT_AMBULATORY_CARE_PROVIDER_SITE_OTHER): Payer: Self-pay

## 2022-07-14 ENCOUNTER — Encounter (INDEPENDENT_AMBULATORY_CARE_PROVIDER_SITE_OTHER): Payer: BLUE CROSS/BLUE SHIELD | Admitting: Cardiovascular Disease

## 2022-07-14 NOTE — Progress Notes (Signed)
07/04/22 7D Vital connect monitor end report printed and placed in VK bin for review.

## 2022-07-18 ENCOUNTER — Telehealth (INDEPENDENT_AMBULATORY_CARE_PROVIDER_SITE_OTHER): Payer: Self-pay

## 2022-07-18 NOTE — Telephone Encounter (Signed)
-----   Message from [name], MD sent at 07/15/2022  5:04 PM EDT -----  Regarding: Holter Monitor Results  Is let her know her PVC burden post ablation is 0.1% which is excellent news.

## 2022-07-18 NOTE — Telephone Encounter (Signed)
Relayed results of monitor report per VK

## 2022-07-25 ENCOUNTER — Other Ambulatory Visit (INDEPENDENT_AMBULATORY_CARE_PROVIDER_SITE_OTHER): Payer: Self-pay | Admitting: Family Medicine

## 2022-07-25 DIAGNOSIS — B001 Herpesviral vesicular dermatitis: Secondary | ICD-10-CM

## 2022-07-25 MED ORDER — VALACYCLOVIR HCL 1 G PO TABS
ORAL_TABLET | ORAL | 3 refills | Status: DC
Start: 2022-07-25 — End: 2024-10-02

## 2022-07-25 NOTE — Telephone Encounter (Signed)
Name, strength, directions of requested refill(s):  valacyclovir (VALTREX) 1 MG tablet     How much medication is remaining:     Pharmacy to send refill to or patient to pick up rx from office (mark requested pharmacy in BOLD):      [id number] [place] [place] - [address] [place] AT [facility] [address]  [address], [place]  [place] [address]  Phone: [phone] Fax: [phone]    [facility] [place] [place] [facility] [address]  [address]  [place] [address]  Phone: [phone] Fax: [phone]        Please mark "X" next to the preferred call back number:    Mobile: [phone] (mobile) X    Home: [phone] (home)    Work: @WORKPHONE @        Medication refill request, see above. Thank you   Patient has been informed that medication refill requests should be called in up to one week prior to running out of medication.    Additional Notes:  Next Visit: MM/DD/YY

## 2022-07-29 DIAGNOSIS — I493 Ventricular premature depolarization: Secondary | ICD-10-CM

## 2022-07-29 DIAGNOSIS — R002 Palpitations: Secondary | ICD-10-CM

## 2022-08-02 ENCOUNTER — Ambulatory Visit (INDEPENDENT_AMBULATORY_CARE_PROVIDER_SITE_OTHER): Payer: BLUE CROSS/BLUE SHIELD | Admitting: Clinical Cardiac Electrophysiology

## 2022-08-02 ENCOUNTER — Encounter (INDEPENDENT_AMBULATORY_CARE_PROVIDER_SITE_OTHER): Payer: Self-pay | Admitting: Clinical Cardiac Electrophysiology

## 2022-08-02 VITALS — BP 120/70 | HR 97 | Wt 312.0 lb

## 2022-08-02 DIAGNOSIS — I428 Other cardiomyopathies: Secondary | ICD-10-CM

## 2022-08-02 DIAGNOSIS — R002 Palpitations: Secondary | ICD-10-CM

## 2022-08-02 DIAGNOSIS — I493 Ventricular premature depolarization: Secondary | ICD-10-CM

## 2022-08-02 NOTE — Progress Notes (Signed)
IMG ARRHYTHMIA OFFICE VISIT    I had the pleasure of seeing Ms. Stimson today for arrhythmia follow up.        She is a patient of Dr. Wilmon Arms.     The patient is a 41 year old woman who underwent ablation in 2005 at [facility] for Wolff-Parkinson-White.  This took care of her syncope and sustained palpitations.     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  She sees Dr. Adin Hector.  She has a history of migraines, has seen Dr. Theodoro Grist.     We initially saw her in April, 2023.  She was getting episodes of lightheadedness, skipped beats, and heart rate monitor showing bradycardia.   Our EKG at that time showed bigeminy.  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  Coupling interval was variable.     3-day monitor showed 40% PVCs.  Echocardiogram showed ejection fraction 52% with mild global hypokinesis.  MRI showed no delayed gadolinium enhancement.  Left ventricular ejection fraction 32%.    On August 2, she underwent PVC ablation.  Site of earliest activation was anterior mitral annulus.  There was immediate suppression of PVCs.  Additional RF delivered.    She is feeling better.  Less dizziness, more energy.  Her feeling of an irregular beat is infrequent.    Follow-up monitor for 7 days showed a PVC burden of 0.1%.        PMH:   Past Medical History:   Diagnosis Date    Abnormal Pap smear of cervix     Behcet's disease     She sees Dr. Adin Hector    BMI 45.0-49.9, adult     Diarrhea     occasional // poss. r/t meds    Headache     on Rx    Lower back pain     Meningitis 2011    Viral // treated and resolved    Neck pain     PT// Improved    Post-operative nausea and vomiting     PVC (premature ventricular contraction)     Scoliosis     moderate// used a Brace at a young age        MEDICATIONS:   Current Outpatient Medications   Medication Sig Dispense Refill    Adalimumab 40 MG/0.4ML Pen-injector Kit Inject 40 mg into the skin every 10 (ten) days (Patient  taking differently: Inject 0.4 mLs (40 mg) into the skin every 10 (ten) days) 6 each 1    Apremilast (Otezla) 30 MG Tab Take 1 tablet (30 mg) by mouth 2 (two) times daily (Patient taking differently: Take 1 tablet (30 mg) by mouth 2 (two) times daily) 180 tablet 1    aspirin EC 325 MG tablet Take 1 tablet (325 mg) by mouth daily 30 tablet 0    Azelastine-Fluticasone 137-50 MCG/ACT Suspension 1 spray by Each Nare route 2 (two) times daily as needed      botulinum Toxin Type A (Botox) 200 units Recon Soln injection Inject 155 units IM every 12 weeks 1 each 3    busPIRone (BUSPAR) 5 MG tablet TAKE 1 TABLET (5 MG) BY MOUTH 3 (THREE) TIMES DAILY AS NEEDED (ANXIETY) 270 tablet 0    clindamycin (CLEOCIN T) 1 % lotion Apply topically every morning      colesevelam (WELCHOL) 625 MG tablet Take 1 tablet (625 mg) by mouth 2 (two) times daily with meals (Patient taking differently: Take 2 tablets (1,250 mg)  by mouth every morning) 180 tablet 3    Dapsone 7.5 % Gel Apply topically every morning      EPINEPHrine (EPIPEN 2-PAK) 0.3 MG/0.3ML Solution Auto-injector injection Inject 0.3 mLs (0.3 mg) into the muscle once as needed (Anaphylaxis) ; go to ER after injection 1 each 1    fexofenadine (Allegra Allergy) 180 MG tablet Take 1 tablet (180 mg) by mouth daily      hydrOXYzine (ATARAX) 10 MG tablet Take 1 tablet (10 mg) by mouth nightly 90 tablet 3    lidocaine-prilocaine (EMLA) cream Apply topically as needed Dermatology procedures      Multiple Vitamins-Minerals (MULTIVITAMIN ADULT PO) Take by mouth nightly      Omega-3 1000 MG Cap Take by mouth nightly      ondansetron (ZOFRAN-ODT) 4 MG disintegrating tablet TAKE 1 TABLET (4 MG) BY MOUTH EVERY 8 (EIGHT) HOURS AS NEEDED FOR NAUSEA 30 tablet 2    Rimegepant Sulfate 75 MG Tablet Dispersible Take 1 tablet (75 mg) by mouth every other day (Patient taking differently: Take 1 tablet (75 mg) by mouth every other day Night) 16 tablet 5    rizatriptan (MAXALT) 10 MG tablet Take 1-2  tablets (10-20 mg total) by mouth once as needed for Migraine Max 3 days a week 36 tablet 3    valACYclovir (VALTREX) 1000 MG tablet Take 2 tabs at onset; repeat once in 12 hours.  Total 4 tabs per outbreak 12 tablet 3    vitamin D (CHOLECALCIFEROL) 25 MCG (1000 UT) tablet Take 1 tablet (1,000 Units) by mouth nightly       No current facility-administered medications for this visit.        Meds reviewed, no changes since last visit.    SH:   Social History     Tobacco Use    Smoking status: Never    Smokeless tobacco: Never   Vaping Use    Vaping Use: Never used   Substance Use Topics    Alcohol use: Not Currently     Alcohol/week: 0.0 standard drinks of alcohol     Comment: rarely    Drug use: Never       FH: no history of sudden death.  Family History reviewed and is otherwise not pertinent    REVIEW OF SYSTEMS: All other systems reviewed and negative except as above.    PHYSICAL EXAMINATION  General Appearance: A well-appearing female in no acute distress.   Vital Signs: BP 120/70   Pulse 97   Wt 141.5 kg (312 lb)   LMP  (LMP Unknown)   BMI 47.44 kg/m    Neck: Supple without jugular venous distention. Thyroid nonpalpable. Normal carotid upstrokes without bruits.  Chest: Clear to auscultation bilaterally with good air movement and respiratory effort and no wheezes, rales, or rhonchi  Cardiovascular: Normal S1 and physiologically split S2 without murmurs, gallops or rub. PMI of normal size and nondisplaced.       ECG: normal sinus rhythm at 97 bpm, normal axis and intervals, no significant ST/T changes, normal EKG.      IMPRESSION/RECOMMENDATIONS: Ms. Wolfert is a 41 y.o. female who presents for follow up.      Frequent ventricular ectopy, 40% burden, with cardiomyopathy.  Status post ablation, burden now 0.1%.    At 3 months after ablation, we will obtain a follow-up echocardiogram to see if LV function has normalized.

## 2022-08-04 ENCOUNTER — Telehealth (INDEPENDENT_AMBULATORY_CARE_PROVIDER_SITE_OTHER): Payer: BLUE CROSS/BLUE SHIELD | Admitting: Internal Medicine

## 2022-08-04 ENCOUNTER — Encounter (INDEPENDENT_AMBULATORY_CARE_PROVIDER_SITE_OTHER): Payer: Self-pay | Admitting: Internal Medicine

## 2022-08-04 DIAGNOSIS — M62838 Other muscle spasm: Secondary | ICD-10-CM

## 2022-08-04 DIAGNOSIS — K760 Fatty (change of) liver, not elsewhere classified: Secondary | ICD-10-CM

## 2022-08-04 DIAGNOSIS — M352 Behcet's disease: Secondary | ICD-10-CM

## 2022-08-04 DIAGNOSIS — R1084 Generalized abdominal pain: Secondary | ICD-10-CM

## 2022-08-04 DIAGNOSIS — R110 Nausea: Secondary | ICD-10-CM

## 2022-08-04 DIAGNOSIS — R197 Diarrhea, unspecified: Secondary | ICD-10-CM

## 2022-08-04 DIAGNOSIS — M0600 Rheumatoid arthritis without rheumatoid factor, unspecified site: Secondary | ICD-10-CM

## 2022-08-04 DIAGNOSIS — K120 Recurrent oral aphthae: Secondary | ICD-10-CM

## 2022-08-04 MED ORDER — CYCLOBENZAPRINE HCL 5 MG PO TABS
ORAL_TABLET | ORAL | 1 refills | Status: DC
Start: 2022-08-04 — End: 2022-09-14

## 2022-08-04 MED ORDER — ADALIMUMAB 40 MG/0.4ML SC PNKT
40.0000 mg | PEN_INJECTOR | SUBCUTANEOUS | 3 refills | Status: DC
Start: 2022-08-04 — End: 2022-11-09

## 2022-08-04 NOTE — Patient Instructions (Signed)
Dear [name] ,     Thanks for arranging a video visit with me.    Here are things I'd like you to do following today's visit:  Medication instructions:  -Please increase Humira to every 7 day frequency.    You may retrieve any orders from this visit in Mychart by using these steps:    After signing into Mychart  1) click Messaging  2) click Letters  3) select the Requisition  4) Review and click print    Please contact the clinic at [phone] with any questions and to schedule your follow up visit.    Sincerely,    Debe Coder, MD  Rheumatologist  [facility]  [address]  [place], [place] [address]  Phone: [phone]  Fax: [phone]

## 2022-08-04 NOTE — Progress Notes (Signed)
RHEUMATOLOGY FOLLOW UP TELEMEDICINE NOTE  Telemedicine Documentation Requirements  Originating site (Patient location): home  Distant site (Provider location): Office  Provider and Title: Dr. Adin Hector  Type of Visit: Video Visit  Verbal Consent has been obtained to conduct a telemedicine visit on this date in order to minimize exposure to COVID-19.      PCP: Virgilio Frees, MD    HPI:    This patient is a 41 y.o. year female with Past Medical History of Prior Cholecystectomy, Wold-Parkinson White, Prior Hysterectomy who returns for follow up of Behcet's HLAB51+, recurrent oral ulcers, pathergy, low grade fevers and seronegative arthritis.      Interval History:  Last visit 05/04/22    She had ablation procedure in August.  Went well, she had post op appointment a few days ago.  She will obtain a repeat ECHO.    She feels better, lightheadedness is much better.    She is on Humira every 10 days, otezla BID.  She will have 3 days prior to her shot where her fever gets worse, joint pain gets worse fatigue gets worse.  She feels like the Humira is wearing off before her next shot is due.    She is taking Otezla 30 mg BID    She is schedule for GI in March.       She saw eye doctor last week-everything fine.    Dermatologist started her on dapsone-she has had a few scars that are healing-had injections from her dermatologist.    Derm: Dr. Shellee Milo    Labs Reviewed:  07/25/22  ESR 37  CRP wnl  CMP ALt 39  CBC wnl      Imaging Reports Reviewed:  04/12/22  Cardiac MRI:  IMPRESSION:       1. No delayed gadolinium enhancement to suggest myocardial fibrosis or  scarring.  2. Normal right ventricular systolic function.  3. Qualitatively mild global left ventricular hypokinesis. Left ventricular  systolic function measures 32% and may be artifactually reduced secondary  to significant motion artifact.    Labs Reviewed:  04/19/22  Throat culture no B-hemolytic strep  POCT flu A/B  Abbot -      Previous Labs:  01/14/22  CMP  wnl  CRP wnl  ViT D 31  ESR wnl  Lipid panel wnl  TSH wnl      08/18/21  CBC wnl  CMP wnl  ESR wnl  CRP wnl    Previous Labs:  05/25/21  Quant Gold-  HepbSAb wnl  HepbSAg-  HepCAb-  HIV-        Previous Labs:  02/23/21  ESR wnl  CRP wnl  CMP ALT 42, AST normal    CRP wnl  ESR wnl  HLAB51 positive  IgE, IgA, IGG, IgM all wnl  CBC wnl    12/03/20  DIAGNOSIS:          A. Lesion, ulnar wrist, excision:     -Fibroadipose tissue and vasculature          B. Synovial tissue, biopsy:     -Fibroadipose tissue and synovium (see comment)          Comment:     Morphologic features typical of rheumatoid arthritis or infection are     not identified.    12/03/20  Bacterial Cultures: No Growth  Fungal Cultures: NG at one week  AFB: No growth    11/20/20  ESR 50  CRP wnl  IgG subclasses wnl  ANA comprehensive panel  negative        Previous Labs:    [facility] Labs:  10/23/20  C-ANCA, P-ANCA, MPO, PR3 all negative  Mitochondrial Ab-  ANA IFA-  Scl70-  Centromere-  RNP-  SMith-  TPPA Ab-      05/18/20  Ferritin 291  CMP AST/A:T 59/70  CBC elevated lymphocytes    02/06/20  MPO-  PR3-  SSA-  aCL IgM 14  aCL IgG, IgA -  Lupus anticoagulant -  B2IgG, IgM -  ACE wnl  CCP-  RF-  Vit D 31  TSH wnl  ESR wnl  CK wnl  C4 wnl  C3 wnl  CRP wnl    07/2019  ANA Screen-  LDH wnl    07/23/2018 lymph node biopsy:DIAGNOSIS:          Lymph node, deep right neck, excisional biopsy:     -Lymph node with non-specific reactive changes   Wound, AFB and Fungal cultures negative at this time    BONE MARROW 05/03/2019      BONE MARROW, LEFT POSTERIOR ILIAC CREST, CORE BIOPSY, CLOT SECTION,    ASPIRATE SMEAR AND TOUCH PREP:        - NORMOCELLULAR BONE MARROW (approximate50-60%) WITH TRILINEAGE HEMATOPOIESIS,  NORMAL MYELOID TO ERYTHROID RATIO AND ADEQUATE NUMBERS OF    MEGAKARYOCYTES WIT H UNREMARKABLE MORPHOLOGY        - A FEW SMALL REACTIVE LYMPHOID AGGREGATES IDENTIFIED, AND MILDLY  INCREASED NUMBERS OF EOSINOPHILS        - PENDING CYTOGENETIC STUDIES OF BONE MARROW SAMPLE   - Remain pending as of 05/14/2019.        COMMENT:    The concurrent flow cytometry performed on the bone marrow aspirate  sample shows no evidence of immunophenotypic abnormalities      Imaging Reviewed:    08/16/20  MRI Brain:IMPRESSION:      1. No intracranial mass, hemorrhage, or hydrocephalus is detected.  2. There is normal pattern of enhancement of the brain parenchyma and  meningeal surfaces.    9/18 MRI Abdomen:  IMPRESSION:      1. Diffuse hepatic steatosis. No suspicious focal hepatic lesions.  2. No biliary ductal dilatation or choledocholithiasis.    02/03/20  MRI Pelvis:  IMPRESSION:         Cystic structure at the vaginal cuff, flank by both ovaries not  significantly changed in size compared to 2019. No abnormal internal  enhancement. This favors a benign process such as a peritoneal inclusion  cyst or a paraovarian cyst.    12/14/19 MRI R Hand:  IMPRESSION:      1.  No suspicious soft tissue mass, osseous mass, or masslike  enhancement in the right hand or wrist to correlate with the diffuse FDG  uptake on recent PET/CT.  2.  There is mild enhancing synovitis in the right wrist joint without  active joint erosion or bony destruction.  3.  A small dorsal ganglion cyst overlying the scapholunate interval.    12/06/19  Full Body PET Scan:    Hypermetabolic soft tissue mass in palmar R hand surface at base of the second digit-stable pulmonary nodulke 1.3 cm left lower lobe-recommend follow up Chest CT 3 months         The following sections were reviewed this encounter by the provider:   Tobacco  Allergies  Meds  Problems  Med Hx  Surg Hx  Fam Hx         PMH/PSH:  Past  Medical History:   Diagnosis Date    Abnormal Pap smear of cervix     Behcet's disease     She sees Dr. Adin Hector    BMI 45.0-49.9, adult     Diarrhea     occasional // poss. r/t meds    Headache     on Rx    Lower back pain     Meningitis 2011    Viral // treated and resolved    Neck pain     PT// Improved    Post-operative nausea and  vomiting     PVC (premature ventricular contraction)     Scoliosis     moderate// used a Brace at a young age        Past Surgical History:   Procedure Laterality Date    ABLATION - PVC'S N/A 06/08/2022    Procedure: Ablation - PVC's;  Surgeon: [name], MD;  Location: [facility];  Service: Cardiovascular;  Laterality: N/A;  SAME DAY DISCHARGE  CARTO NO TEE    ARTHROTOMY, WRIST Right 12/03/2020    Procedure: RIGHT UPPER EXTREMITY SYNOVIAL BIOPSY;  Surgeon: Kellie Simmering, MD;  Location: [facility];  Service: Plastics;  Laterality: Right;    BIOPSY, LYMPH NODE N/A 07/23/2018    Procedure: BIOPSY, LYMPH NODE;  Surgeon: Shelda Pal, MD;  Location: [facility];  Service: ENT;  Laterality: N/A;  NECK DEEP LYMPH NODE BIOPSY    BONE MARROW BIOPSY  2020    CARDIAC ABLATION  2005    [facility] for Wolff-Parkinson-White    CHOLECYSTECTOMY  2009    D&C DIAGNOSTIC  2010    DILATION AND CURETTAGE OF UTERUS  2010    FINGER GANGLION CYST EXCISION  04/2018    GANGLION CYST EXCISION  2014    04/2018 cyst removed from index finger-[facility]    HERNIA REPAIR  2016    HYSTERECTOMY  2012    PELVIC LAPAROSCOPY  2011    UMBILICAL HERNIA REPAIR  2016    UPPER GASTROINTESTINAL ENDOSCOPY  2009    WISDOM TOOTH EXTRACTION  1998        FH/SH:  Family History   Adopted: Yes       Social History     Tobacco Use    Smoking status: Never    Smokeless tobacco: Never   Vaping Use    Vaping Use: Never used   Substance Use Topics    Alcohol use: Not Currently     Alcohol/week: 0.0 standard drinks of alcohol     Comment: rarely    Drug use: Never        Meds/ Allergies:  No outpatient medications have been marked as taking for the 08/04/22 encounter (Telemedicine Visit) with Baltazar Najjar, MD.     Allergies   Allergen Reactions    Fruit Blend Flavor [Flavoring Agent] Anaphylaxis and Swelling    Latex Other (See Comments), Hives and Swelling     Swelling and redness throughout body   Also severe  allergy to surgical glue    Adhesive [Wound Dressing Adhesive] Itching     dermabond    Other Itching and Rash     Sutures Monocryl // and Tropical fruits like coconuts, pineapple, kiwi, etc.    Oxycodone Hallucinations          PHYSICAL EXAM  General- WNWD, alert and oriented, NAD  Eyes- EOMI, PERRL, no conjunctival injection, no scleral icterus  ENMT- face symmetric  without lesions  Skin- no rash, no alopecia  Pulm: No conversational dyspnea, breathing comfortable on room air      .        LABS:  Lab Results   Component Value Date    WBC 7.8 07/25/2022    HGB 14.1 07/25/2022    HCT 42.1 07/25/2022    MCV 91 07/25/2022    PLT 419 07/25/2022      Lab Results   Component Value Date    CREAT 0.72 07/25/2022    BUN 10 07/25/2022    NA 139 07/25/2022    K 4.5 07/25/2022    CL 102 07/25/2022    CO2 19 (L) 07/25/2022      Lab Results   Component Value Date    ALT 39 (H) 07/25/2022    AST 28 07/25/2022    ALKPHOS 71 07/25/2022    BILITOTAL 0.3 07/25/2022     Lab Results   Component Value Date    ESR 37 (H) 07/25/2022    ESR 15 01/14/2022    ESR 6 02/23/2021    ESR 7 01/25/2021    ESR 50 (H) 11/20/2020      Lab Results   Component Value Date    CRP 1 07/25/2022    CRP 1 01/14/2022    CRP <1 02/23/2021    CRP 2 01/25/2021    CRP 3 11/20/2020      No results found for: "ANA"   Lab Results   Component Value Date    RHEUMFACTOR <15.0 02/06/2020      Lab Results   Component Value Date    CCP <15.6 02/06/2020     No results found for: "URICACID"        ASSESSMENT/PLAN:    This patient is a 41 y.o. year female with Past Medical History of Prior Cholecystectomy, Wolf-Parkinson White, Prior Hysterectomy, Fatty Liver Disease who returns for follow up of Behcet's Disease and seronegative rheumatoid arthritis.    Will continue Otezla 30 mg BID, but increase Humira to 40 mg every ten days.  She develops recurrent fevers, joint pain, swelling and fatigue 3 days before her shot is due-Humira seems to be wearing off.  Have placed a new  order in the system for increased frequency.    Flexeril for muscle spasms.    S/P ablation for cardiac arrythmia.    Following with Neurology for migraines.    She will see GI for diarrhea, abdominal pain, nausea.      1. Generalized abdominal pain  - Referral to Gastroenterology ([place]); Future    2. Diarrhea, unspecified type  - Referral to Gastroenterology ([place]); Future    3. Nausea  - Referral to Gastroenterology ([place]); Future    4. Muscle spasm  - cyclobenzaprine (FLEXERIL) 5 MG tablet; Take 1 tablet for muscle spasms at night as needed  Dispense: 30 tablet; Refill: 1    5. Behcet's disease  - Adalimumab 40 MG/0.4ML Pen-injector Kit; Inject 0.4 mLs (40 mg) into the skin once a week  Dispense: 8 each; Refill: 3    6. Aphthous ulcer  - Adalimumab 40 MG/0.4ML Pen-injector Kit; Inject 0.4 mLs (40 mg) into the skin once a week  Dispense: 8 each; Refill: 3    7. Fatty liver disease, nonalcoholic  - Adalimumab 40 MG/0.4ML Pen-injector Kit; Inject 0.4 mLs (40 mg) into the skin once a week  Dispense: 8 each; Refill: 3    8. Seronegative rheumatoid arthritis  - Adalimumab 40  MG/0.4ML Pen-injector Kit; Inject 0.4 mLs (40 mg) into the skin once a week  Dispense: 8 each; Refill: 3                   Total Time spent reviewing records, interviewing patient and coordinating plan of care: 20 min    FOLLOW UP:  Return in about 6 weeks (around 09/15/2022) for Follow Up, in person, 6-8 weeks in person.                                                                                  Debe Coder, MD  Rheumatologist  [facility]  [address]  [place], [place] [address]  Phone: [phone]  Fax: [phone]

## 2022-08-08 ENCOUNTER — Telehealth: Payer: Self-pay

## 2022-08-08 NOTE — Telephone Encounter (Signed)
Humira    Received new script from Dr. Lauro Franklin dosing       New dosing (every 7 days now)...Marland KitchenCalled [phone] (FEP) and spoke to rep. Redid a PA verbally for new dosing with insurance rep. PA approved until 08/08/2023    Spoke to patient and informed her script released to [facility]

## 2022-08-12 NOTE — Progress Notes (Signed)
07/04/22 7D monitor end report reviewed by VK. End report scanned, billed, and results called back by nursing .

## 2022-08-25 ENCOUNTER — Telehealth: Payer: Self-pay

## 2022-08-26 ENCOUNTER — Observation Stay
Admission: EM | Admit: 2022-08-26 | Discharge: 2022-08-29 | Disposition: A | Discharge status: Home Health | Payer: BLUE CROSS/BLUE SHIELD | Attending: Internal Medicine | Admitting: Internal Medicine

## 2022-08-26 DIAGNOSIS — N898 Other specified noninflammatory disorders of vagina: Secondary | ICD-10-CM | POA: Insufficient documentation

## 2022-08-26 DIAGNOSIS — D72829 Elevated white blood cell count, unspecified: Secondary | ICD-10-CM | POA: Insufficient documentation

## 2022-08-26 DIAGNOSIS — M7062 Trochanteric bursitis, left hip: Secondary | ICD-10-CM | POA: Insufficient documentation

## 2022-08-26 DIAGNOSIS — R600 Localized edema: Secondary | ICD-10-CM | POA: Insufficient documentation

## 2022-08-26 DIAGNOSIS — E872 Acidosis, unspecified: Secondary | ICD-10-CM | POA: Insufficient documentation

## 2022-08-26 DIAGNOSIS — Z79899 Other long term (current) drug therapy: Secondary | ICD-10-CM | POA: Insufficient documentation

## 2022-08-26 DIAGNOSIS — M352 Behcet's disease: Secondary | ICD-10-CM | POA: Insufficient documentation

## 2022-08-26 DIAGNOSIS — M069 Rheumatoid arthritis, unspecified: Secondary | ICD-10-CM | POA: Insufficient documentation

## 2022-08-26 DIAGNOSIS — M25552 Pain in left hip: Principal | ICD-10-CM | POA: Insufficient documentation

## 2022-08-26 DIAGNOSIS — R110 Nausea: Secondary | ICD-10-CM | POA: Insufficient documentation

## 2022-08-26 DIAGNOSIS — F419 Anxiety disorder, unspecified: Secondary | ICD-10-CM | POA: Insufficient documentation

## 2022-08-26 DIAGNOSIS — Z79620 Long term (current) use of immunosuppressive biologic: Secondary | ICD-10-CM | POA: Insufficient documentation

## 2022-08-26 DIAGNOSIS — B0089 Other herpesviral infection: Secondary | ICD-10-CM | POA: Insufficient documentation

## 2022-08-26 DIAGNOSIS — G43909 Migraine, unspecified, not intractable, without status migrainosus: Secondary | ICD-10-CM | POA: Insufficient documentation

## 2022-08-26 DIAGNOSIS — I10 Essential (primary) hypertension: Secondary | ICD-10-CM | POA: Insufficient documentation

## 2022-08-26 DIAGNOSIS — M0600 Rheumatoid arthritis without rheumatoid factor, unspecified site: Secondary | ICD-10-CM | POA: Insufficient documentation

## 2022-08-26 DIAGNOSIS — M25452 Effusion, left hip: Secondary | ICD-10-CM | POA: Insufficient documentation

## 2022-08-26 DIAGNOSIS — Z8679 Personal history of other diseases of the circulatory system: Secondary | ICD-10-CM | POA: Insufficient documentation

## 2022-08-26 DIAGNOSIS — K120 Recurrent oral aphthae: Secondary | ICD-10-CM | POA: Insufficient documentation

## 2022-08-26 DIAGNOSIS — Z9049 Acquired absence of other specified parts of digestive tract: Secondary | ICD-10-CM | POA: Insufficient documentation

## 2022-08-26 DIAGNOSIS — M1990 Unspecified osteoarthritis, unspecified site: Secondary | ICD-10-CM | POA: Insufficient documentation

## 2022-08-26 NOTE — ED Triage Notes (Signed)
Pt c/o  10/10   left hip shooting pain not relieved by muscle relaxants, N/V prior to coming to ED, pt has hx of rheumatoid arthritis,   scoliosis,  ablation 08/ 2023.

## 2022-08-27 ENCOUNTER — Observation Stay: Payer: BLUE CROSS/BLUE SHIELD

## 2022-08-27 ENCOUNTER — Emergency Department: Payer: BLUE CROSS/BLUE SHIELD

## 2022-08-27 DIAGNOSIS — M25552 Pain in left hip: Secondary | ICD-10-CM | POA: Diagnosis present

## 2022-08-27 LAB — CBC AND DIFFERENTIAL
Absolute NRBC: 0 10*3/uL (ref 0.00–0.00)
Basophils Absolute Automated: 0.03 10*3/uL (ref 0.00–0.08)
Basophils Automated: 0.3 %
Eosinophils Absolute Automated: 0.05 10*3/uL (ref 0.00–0.44)
Eosinophils Automated: 0.4 %
Hematocrit: 36.7 % (ref 34.7–43.7)
Hgb: 12.2 g/dL (ref 11.4–14.8)
Immature Granulocytes Absolute: 0.03 10*3/uL (ref 0.00–0.07)
Immature Granulocytes: 0.3 %
Instrument Absolute Neutrophil Count: 7.74 10*3/uL — ABNORMAL HIGH (ref 1.10–6.33)
Lymphocytes Absolute Automated: 2.77 10*3/uL (ref 0.42–3.22)
Lymphocytes Automated: 24.4 %
MCH: 30.7 pg (ref 25.1–33.5)
MCHC: 33.2 g/dL (ref 31.5–35.8)
MCV: 92.2 fL (ref 78.0–96.0)
MPV: 8.7 fL — ABNORMAL LOW (ref 8.9–12.5)
Monocytes Absolute Automated: 0.71 10*3/uL (ref 0.21–0.85)
Monocytes: 6.3 %
Neutrophils Absolute: 7.74 10*3/uL — ABNORMAL HIGH (ref 1.10–6.33)
Neutrophils: 68.3 %
Nucleated RBC: 0 /100 WBC (ref 0.0–0.0)
Platelets: 353 10*3/uL — ABNORMAL HIGH (ref 142–346)
RBC: 3.98 10*6/uL (ref 3.90–5.10)
RDW: 13 % (ref 11–15)
WBC: 11.33 10*3/uL — ABNORMAL HIGH (ref 3.10–9.50)

## 2022-08-27 LAB — COMPREHENSIVE METABOLIC PANEL
ALT: 45 U/L (ref 0–55)
AST (SGOT): 27 U/L (ref 5–41)
Albumin/Globulin Ratio: 1.1 (ref 0.9–2.2)
Albumin: 3.6 g/dL (ref 3.5–5.0)
Alkaline Phosphatase: 65 U/L (ref 37–117)
Anion Gap: 11 (ref 5.0–15.0)
BUN: 12 mg/dL (ref 7.0–21.0)
Bilirubin, Total: 0.3 mg/dL (ref 0.2–1.2)
CO2: 19 mEq/L (ref 17–29)
Calcium: 9.6 mg/dL (ref 8.5–10.5)
Chloride: 107 mEq/L (ref 99–111)
Creatinine: 0.7 mg/dL (ref 0.4–1.0)
Globulin: 3.4 g/dL (ref 2.0–3.6)
Glucose: 99 mg/dL (ref 70–100)
Potassium: 4.1 mEq/L (ref 3.5–5.3)
Protein, Total: 7 g/dL (ref 6.0–8.3)
Sodium: 137 mEq/L (ref 135–145)
eGFR: >60 mL/min/{1.73_m2} (ref >=60)

## 2022-08-27 LAB — LACTIC ACID, PLASMA
Lactic Acid: 2.2 mmol/L — ABNORMAL HIGH (ref 0.2–2.0)
Lactic Acid: 2 mmol/L (ref 0.2–2.0)

## 2022-08-27 LAB — C-REACTIVE PROTEIN: C-Reactive Protein: 0.1 mg/dL (ref 0.0–1.1)

## 2022-08-27 LAB — MAGNESIUM: Magnesium: 1.9 mg/dL (ref 1.6–2.6)

## 2022-08-27 LAB — SEDIMENTATION RATE: Sed Rate: 18 mm/Hr (ref 0–20)

## 2022-08-27 LAB — PHOSPHORUS: Phosphorus: 2.9 mg/dL (ref 2.3–4.7)

## 2022-08-27 MED ORDER — ONDANSETRON HCL 4 MG/2ML IJ SOLN
4.0000 mg | Freq: Four times a day (QID) | INTRAMUSCULAR | Status: DC | PRN
Start: 2022-08-27 — End: 2022-08-29
  Administered 2022-08-27 – 2022-08-28 (×2): 4 mg via INTRAVENOUS
  Filled 2022-08-27 (×2): qty 2

## 2022-08-27 MED ORDER — SUMATRIPTAN SUCCINATE 25 MG PO TABS
25.0000 mg | ORAL_TABLET | Freq: Two times a day (BID) | ORAL | Status: DC | PRN
Start: 2022-08-27 — End: 2022-08-29
  Administered 2022-08-29: 25 mg via ORAL
  Filled 2022-08-27 (×2): qty 1

## 2022-08-27 MED ORDER — GADOTERATE MEGLUMINE 10 MMOL/20ML IV SOLN (CLARISCAN)
20.0000 mL | Freq: Once | INTRAVENOUS | Status: AC | PRN
Start: 2022-08-27 — End: 2022-08-27
  Administered 2022-08-27: 20 mL via INTRAVENOUS

## 2022-08-27 MED ORDER — DIAZEPAM 5 MG/ML IJ SOLN
5.0000 mg | Freq: Once | INTRAMUSCULAR | Status: AC
Start: 2022-08-27 — End: 2022-08-27
  Administered 2022-08-27: 5 mg via INTRAVENOUS
  Filled 2022-08-27: qty 2

## 2022-08-27 MED ORDER — SODIUM CHLORIDE 0.9 % IV BOLUS
1000.0000 mL | Freq: Once | INTRAVENOUS | Status: AC
Start: 2022-08-27 — End: 2022-08-27
  Administered 2022-08-27: 1000 mL via INTRAVENOUS

## 2022-08-27 MED ORDER — VANCOMYCIN HCL IN NACL 1.25-0.9 GM/250ML-% IV SOLN
1250.0000 mg | Freq: Two times a day (BID) | INTRAVENOUS | Status: DC
Start: 2022-08-27 — End: 2022-08-28
  Administered 2022-08-27 (×2): 1250 mg via INTRAVENOUS
  Filled 2022-08-27 (×3): qty 250

## 2022-08-27 MED ORDER — SODIUM CHLORIDE 0.9 % IV MBP
4.5000 g | Freq: Four times a day (QID) | INTRAVENOUS | Status: DC
Start: 2022-08-27 — End: 2022-08-28
  Administered 2022-08-27 – 2022-08-28 (×4): 4.5 g via INTRAVENOUS
  Filled 2022-08-27 (×5): qty 20

## 2022-08-27 MED ORDER — HYDROMORPHONE HCL 0.5 MG/0.5 ML IJ SOLN
0.2000 mg | INTRAMUSCULAR | Status: DC | PRN
Start: 2022-08-27 — End: 2022-08-28
  Administered 2022-08-27 (×3): 0.2 mg via INTRAVENOUS
  Filled 2022-08-27 (×4): qty 1

## 2022-08-27 MED ORDER — GLUCOSE 40 % PO GEL (WRAP)
15.0000 g | ORAL | Status: DC | PRN
Start: 2022-08-27 — End: 2022-08-29

## 2022-08-27 MED ORDER — KETOROLAC TROMETHAMINE 30 MG/ML IJ SOLN
30.0000 mg | Freq: Once | INTRAMUSCULAR | Status: AC
Start: 2022-08-27 — End: 2022-08-27
  Administered 2022-08-27: 30 mg via INTRAVENOUS
  Filled 2022-08-27: qty 1

## 2022-08-27 MED ORDER — ACETAMINOPHEN 650 MG RE SUPP
650.0000 mg | Freq: Four times a day (QID) | RECTAL | Status: DC | PRN
Start: 2022-08-27 — End: 2022-08-29

## 2022-08-27 MED ORDER — MORPHINE SULFATE 4 MG/ML IJ/IV SOLN (WRAP)
4.0000 mg | Freq: Once | Status: AC
Start: 2022-08-27 — End: 2022-08-27
  Administered 2022-08-27: 4 mg via INTRAVENOUS
  Filled 2022-08-27: qty 1

## 2022-08-27 MED ORDER — LIDOCAINE 5 % EX PTCH
1.0000 | MEDICATED_PATCH | CUTANEOUS | Status: DC
Start: 2022-08-27 — End: 2022-08-29
  Filled 2022-08-27: qty 1

## 2022-08-27 MED ORDER — DEXTROSE 10 % IV BOLUS
12.5000 g | INTRAVENOUS | Status: DC | PRN
Start: 2022-08-27 — End: 2022-08-29

## 2022-08-27 MED ORDER — TRAMADOL HCL 50 MG PO TABS
50.0000 mg | ORAL_TABLET | Freq: Three times a day (TID) | ORAL | Status: DC
Start: 2022-08-27 — End: 2022-08-29
  Administered 2022-08-27 – 2022-08-29 (×7): 50 mg via ORAL
  Filled 2022-08-27 (×7): qty 1

## 2022-08-27 MED ORDER — MELATONIN 3 MG PO TABS
3.0000 mg | ORAL_TABLET | Freq: Every evening | ORAL | Status: DC | PRN
Start: 2022-08-27 — End: 2022-08-29

## 2022-08-27 MED ORDER — ONDANSETRON 4 MG PO TBDP
4.0000 mg | ORAL_TABLET | Freq: Four times a day (QID) | ORAL | Status: DC | PRN
Start: 2022-08-27 — End: 2022-08-29
  Administered 2022-08-29: 4 mg via ORAL
  Filled 2022-08-27: qty 1

## 2022-08-27 MED ORDER — ACETAMINOPHEN 500 MG PO TABS
1000.0000 mg | ORAL_TABLET | Freq: Once | ORAL | Status: AC
Start: 2022-08-27 — End: 2022-08-27
  Administered 2022-08-27: 1000 mg via ORAL
  Filled 2022-08-27: qty 2

## 2022-08-27 MED ORDER — ACETAMINOPHEN 325 MG PO TABS
650.0000 mg | ORAL_TABLET | Freq: Once | ORAL | Status: AC
Start: 2022-08-27 — End: 2022-08-27
  Administered 2022-08-27: 650 mg via ORAL

## 2022-08-27 MED ORDER — COLESEVELAM HCL 625 MG PO TABS
1250.0000 mg | ORAL_TABLET | Freq: Every morning | ORAL | Status: DC
Start: 2022-08-27 — End: 2022-08-29
  Administered 2022-08-27 – 2022-08-29 (×3): 1250 mg via ORAL
  Filled 2022-08-27 (×4): qty 2

## 2022-08-27 MED ORDER — DEXTROSE 50 % IV SOLN
12.5000 g | INTRAVENOUS | Status: DC | PRN
Start: 2022-08-27 — End: 2022-08-29

## 2022-08-27 MED ORDER — NALOXONE HCL 0.4 MG/ML IJ SOLN (WRAP)
0.2000 mg | INTRAMUSCULAR | Status: DC | PRN
Start: 2022-08-27 — End: 2022-08-29

## 2022-08-27 MED ORDER — ACETAMINOPHEN 325 MG PO TABS
650.0000 mg | ORAL_TABLET | Freq: Four times a day (QID) | ORAL | Status: DC | PRN
Start: 2022-08-27 — End: 2022-08-29
  Filled 2022-08-27: qty 2

## 2022-08-27 MED ORDER — VANCOMYCIN PHARMACY TO DOSE PLACEHOLDER
INTRAVENOUS | Status: DC
Start: 2022-08-27 — End: 2022-08-28

## 2022-08-27 MED ORDER — GLUCAGON 1 MG IJ SOLR (WRAP)
1.0000 mg | INTRAMUSCULAR | Status: DC | PRN
Start: 2022-08-27 — End: 2022-08-29

## 2022-08-27 MED ORDER — LORAZEPAM 1 MG PO TABS
0.5000 mg | ORAL_TABLET | Freq: Once | ORAL | Status: AC | PRN
Start: 2022-08-27 — End: 2022-08-27
  Administered 2022-08-27: 0.5 mg via ORAL
  Filled 2022-08-27: qty 1

## 2022-08-27 MED ORDER — ONDANSETRON HCL 4 MG/2ML IJ SOLN
4.0000 mg | Freq: Once | INTRAMUSCULAR | Status: AC
Start: 2022-08-27 — End: 2022-08-27
  Administered 2022-08-27: 4 mg via INTRAVENOUS
  Filled 2022-08-27: qty 2

## 2022-08-27 MED ORDER — VALACYCLOVIR HCL 500 MG PO TABS
1000.0000 mg | ORAL_TABLET | Freq: Two times a day (BID) | ORAL | Status: DC
Start: 2022-08-27 — End: 2022-08-29
  Filled 2022-08-27 (×7): qty 2

## 2022-08-27 NOTE — Plan of Care (Addendum)
Orientation: A&Ox4  Rhythm on tele: n/a  Oxygen/airway: room air, sat mid-90's  Pain: pt reports 8/10 pain with movement, mild at rest and managed with dilaudid, tylenol, tramadol and ice  Lines/Drips: 20 G R AC  Diet: NPO, switched to regular  GI/GU: continent x2, last BM 10/20  Ambulation: x1 standby assist  Fall Score: high, per protocol, preca     Critical Lab/Imaging/Procedures:  -MRI of L hip shows tear of anterosuperior acetabular labrum  -XR hip neg     Plan:    -pain management  -rheumatoid consultation  -orthopaedics following

## 2022-08-27 NOTE — ED Provider Notes (Signed)
[facility] EMERGENCY DEPARTMENT  ATTENDING PHYSICIAN HISTORY AND PHYSICAL EXAM     Patient Name: Hailey [name], Hailey  Department:[facility] EMERGENCY DEPT  Encounter Date:  08/26/2022  Attending Physician: Tiajuana Amass, MD   Age: 41 y.o. female  Patient Room: S 7/S 7  PCP: Virgilio Frees, MD           Diagnosis/Disposition:     Final diagnoses:   None       ED Disposition       None            Follow-Up Providers (if applicable)    No follow-up provider specified.     New Prescriptions    No medications on file           Medical Decision Making:   {THIS REVIEW SECTION WILL AUTODELETE ONCE NOTE IS SIGNED  Action Links   Snapshot  Orders  Decision Support  Secure Chat  Transfer Documents  Patient Care Timeline  Workup  Dispo  END REVIEW SECTION(Optional):55325}    ED Course as of 08/27/22 0030   Sat Aug 27, 2022   0025 Ddx includes fracture, dislocation, MSK strain, septic joint, arthritis flare. Patient with L hip muscle spasms, similar to prior bechets arthritis flares. No fevers, trauma, saddle anesthesia.  [AZ]      ED Course User Index  [AZ] Bosie Clos Loleta Rose, MD       Medical Decision Making  Risk  OTC drugs.  Prescription drug management.      {Records Reviewed (internal and external)? (Optional):61520::"N/A"}  {Was management discussed with a consultant? (Optional):61519::"N/A"}  {Diagnostic test considered and not performed (Optional):61521::"N/A"}  {Prescription medications considered and not given (Optional):61522::"N/A"}  {Hospitalization considered but not done (Optional):61523}  {Was the decision around the need for surgery discussed with a consultant? (Optional):61527::"N/A"}  {Social Determinants of Health Considerations (Optional):61524::"N/A"}  {Was there decision to not resuscitate or to de-escalate care due to poor prognosis? (Optional):61525::"N/A"}        History of Presenting Illness:   {THIS REVIEW SECTION WILL AUTODELETE ONCE NOTE IS SIGNED  History Review Links   Snapshot  Problem List   Care Everywhere  PMH  PSH  FamHx  SocHx  Allergies  Meds  Immunizations  END REVIEW SECTION(Optional):55325}  Nursing Triage note: Pt BIBA with cc of back pain. Pt reports chronic joint pain. Pt states that pain worsened two hours PTA. Pt reports pain to left hip and "spine". Pt denies injury or trauma. Reports taking "muscle relaxer yesterday with minimal relief." Pt reports vomiting x1 due to pain. Pt denies numbness/tingling to extremities. good PMS reported.  Chief complaint: Hip Pain and Back Pain    Hailey [name] is a 41 y.o. female with history of Behcet's and rheumatoid arthritis who presents with acute on chronic joint pain.  Started having pain in her left hip since last night.  Did take a muscle relaxer which did seem to take the edge off.  She was able to go to work today.  Around 7 to 8 PM she started having muscle spasms in her left hip.  Tried to get up to use the bathroom but did not feel like she could lift her left leg and so husband brought her to the ED for evaluation.  No recent fevers.  No urinary symptoms.  No recent falls trauma.  Typically does have flares of joint pain like this especially in her left hip but this time the pain is worse.  Review of Systems:  Physical Exam:     Review of Systems    Positive and negative ROS per above and in HPI. All other systems reviewed and negative.     Pulse 100  BP (!) 144/114  Resp 20  SpO2 100 %  Temp 98 F (36.7 C)     Nursing notes and vital signs reviewed    General: Patient in no acute distress, overall appears nontoxic  Head: Normocephalic, atraumatic  Neck: supple  Eyes: No scleral icterus  Ears/Nose/Throat: Moist mucous membranes, airway is patent, no facial swelling  Cardiovascular: Regular rate and rhythm with no murmurs/rubs appreciated, no LE swelling  Pulmonary: Normal respiratory effort, CTAB w/o wheezes/crackles  GI: Abdomen is soft, non-distended, and non-tender.  Peritoneal signs are absent.  Back: No gross  deformities.  Musculoskeletal: No gross injuries or deformities.  Tenderness to palpation over the left lateral hip, 2+ DP pulses in bilateral lower extremities, no saddle anesthesia  Neurological: Alert and oriented x3, face symmetric, hearing intact to voice, speech normal, no gross deficits  Psych: Behaving appropriately  Skin: Warm, dry, no rashes            Interpretations, Clinical Decision Tools and Critical Care:   {THIS REVIEW SECTION WILL AUTODELETE ONCE NOTE IS SIGNED  Interpretation/Data Review Links   Snapshot  Patient Care Timeline  Workup Results Review  EKG  Labs  Imaging  Media  Encounter Orders   END REVIEW SECTION(Optional):55325}  O2 Sat:  The patient's oxygen saturation was 100 % on room air. This was independently interpreted by me as Normal.                Procedures:   Procedures      Attestations:     Scribe Attestation: I am the first provider for this patient and I personally performed the services documented. Orlean PattenSpencer Austyn Perriello is scribing for me on this chart. This note and the patient instructions accurately reflect work and decisions made by me.      Documentation Notes:  Parts of this note were generated by the Epic EMR system/ Dragon speech recognition and may contain inherent errors or omissions not intended by the user. Grammatical errors, random word insertions, deletions, pronoun errors and incomplete sentences are occasional consequences of this technology due to software limitations. Not all errors are caught or corrected.  My documentation is often completed after the patient is no longer under my clinical care. In some cases, the Epic EMR may pull updated results into the above documentation which may not reflect all results or information that were available to me at the time of my medical decision making.   If there are questions or concerns about the content of this note or information contained within the body of this dictation they should be addressed directly with the  author for clarification.

## 2022-08-27 NOTE — Progress Notes (Signed)
Assessment Smart phrase for obs and low risk patient     Introduced [employer] and self to patient and/or family member/care giver (Allea Edynn Gillock  [phone])    Discussed plan of care and possible discharge needs with patient/family/care-giver - yes    Patient has cognitive ability to participate in care decisions and follow-up care as needed - Yes    Verified (and corrected if needed) demographics and PCP on facesheet - Yes    Assessed for lack of insurance or underinsured. - Yes    Patient is NOT identified as a Medicare focused patient or high risk for failed discharge plan or readmission as detailed in the High Risk Patient Screening policy - Yes    Plan of care: Patient lives with husband in 2 story home. Pt has a cane that she uses around the house mostly, no HHC in place. Husband will be able to provide transportation home once discharged.     Discharge Plan A: Home with family  Discharge Plan B: TBD    Barriers to discharge: None      Joette Catching, LMSW  [facility]  Social Work Case Manager 1  [phone]

## 2022-08-27 NOTE — Plan of Care (Signed)
NURSING PROGRESS NOTE    Patient Name: Hailey [name] (41 y.o. female)  Admission Date: 08/26/2022 [facility] Day 0)    Shift Note: {Comments}    Recent Labs   Lab 08/27/22  0239   Sodium 137   Potassium 4.1   Chloride 107   CO2 19   BUN 12.0   Creatinine 0.7   eGFR >60.0   Glucose 99   Calcium 9.6       Recent Labs   Lab 08/27/22  0239   WBC 11.33*   Hgb 12.2   Hematocrit 36.7   Platelets 353*         Patient Lines/Drains/Airways Status       Active Lines, Drains and Airways       Name Placement date Placement time Site Days    Peripheral IV 08/27/22 20 G Standard Right Antecubital 08/27/22  0036  Antecubital  less than 1                    MEWS Score: {Comment}    Last BM: {Comment}  Pending Orders: {Comment}  Discharge Plan: {Comment}    Safety Checklist   Fall Precautions {Y/N}   Avasys {Y/N}   Seizure Precautions {Y/N}   Aspiration Precautions {Y/N}   Belongings Checked {Y/N}     Service Essentials  Medication Teaching {Y/N}   Purposeful Hourly Rounding {Y/N}       Interpreter Services:  Does the patient require an Interpreter? {Y/N}    If yes, what form of interpreter services was used? {Comment}     If family was utilized, is Tour manager form signed and in the chart? {Y/N/NA}     Problem: Safety  Goal: Patient will be free from injury during hospitalization  Outcome: Progressing  Goal: Patient will be free from infection during hospitalization  Outcome: Progressing     Problem: Pain  Goal: Pain at adequate level as identified by patient  Outcome: Progressing     Problem: Side Effects from Pain Analgesia  Goal: Patient will experience minimal side effects of analgesic therapy  Outcome: Progressing     Problem: Discharge Barriers  Goal: Patient will be discharged home or other facility with appropriate resources  Outcome: Progressing     Problem: Psychosocial and Spiritual Needs  Goal: Demonstrates ability to cope with hospitalization/illness  Outcome: Progressing     Problem: Pain interferes  with ability to perform ADL  Goal: Pain at adequate level as identified by patient  Outcome: Progressing     Problem: Moderate/High Fall Risk Score >5  Goal: Patient will remain free of falls  Outcome: Progressing

## 2022-08-27 NOTE — Consults (Signed)
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 Ortho consulted for r/o septic arthritis    Reductions/Procedures/Splinting/Anesthesia performed:  Reductions: NO reduction performed  Splinting/Casting: N/A  Procedures: N/A  Anesthesia: N/A    Plan:    -Recommend MRI w/ contrast of L hip  -Recommend CT guided aspiration of the hip  -WBAT   -Recommend multimodal pain control and PTOT as able       Medical Decision Making: (need 2 of 3)  Diagnosis Complexity:  (based on presenting problem and/or final diagnosis)    Complicated with risk to bodily function (based on presenting problem and/or Dx)         -  Is fracture pathologic (include osteoporosis or neoplastic lesions): No/Not applicable        -  Has Medical Co-morbidities/Surgical Risk-factors: yes       Data (Level 4- need 1, Level 5- need 2): X-rays  - Data reviewed (need 3 pts): Radiology reports reviewed: X-rays     Risk of Management: High: Admit to Hospital and Needs IV Pain meds    Bayard Males, MD     Orthopaedic on-call pager [phone]     HPI     Hailey [name] is a middle aged appearing adult female w/ a 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 Was doing okay until last night at which point the pain progressed to the point where she could not bear weight on the leg. Her pain is "deep" in the groin and is not muscular or typical of her chronic autoimmune disease related pain. She is also worried that her HR is quite elevated for her norm. No hx of trauma or hx of recent infections     Since she has gotten some IV pain medication, her pain level has improved, and she can tolerate more motion but cannot stand on the  leg.    [Orthopaedic consultation has specifically been requested to address this patient's current musculoskeletal presentation.     Past Medical and Surgical History     Past Medical History:   Diagnosis Date    Abnormal Pap smear of cervix     Behcet's disease     She sees Dr. Adin Hector    BMI 45.0-49.9, adult     Diarrhea     occasional // poss. r/t meds    Headache     on Rx    Lower back pain     Meningitis 2011    Viral // treated and resolved    Neck pain     PT// Improved    Post-operative nausea and vomiting     PVC (premature ventricular contraction)     Scoliosis     moderate// used a Brace at a young age            Patient has BMI=Body mass index is 47.9 kg/m.  Diagnosis: Obesity Class 3 (formerly known as Morbid Obesity) based on BMI criteria      Past Surgical History:   Procedure Laterality Date    ABLATION - PVC'S N/A 06/08/2022  Procedure: Ablation - PVC's;  Surgeon: [name], MD;  Location: [facility];  Service: Cardiovascular;  Laterality: N/A;  SAME DAY DISCHARGE  CARTO NO TEE    ARTHROTOMY, WRIST Right 12/03/2020    Procedure: RIGHT UPPER EXTREMITY SYNOVIAL BIOPSY;  Surgeon: Kellie Simmering, MD;  Location: [place] [facility];  Service: Plastics;  Laterality: Right;    BIOPSY, LYMPH NODE N/A 07/23/2018    Procedure: BIOPSY, LYMPH NODE;  Surgeon: Shelda Pal, MD;  Location: [place] [facility];  Service: ENT;  Laterality: N/A;  NECK DEEP LYMPH NODE BIOPSY    BONE MARROW BIOPSY  2020    CARDIAC ABLATION  2005    [facility] for Wolff-Parkinson-White    CHOLECYSTECTOMY  2009    D&C DIAGNOSTIC  2010    DILATION AND CURETTAGE OF UTERUS  2010    FINGER GANGLION CYST EXCISION  04/2018    GANGLION CYST EXCISION  2014    04/2018 cyst removed from index finger-[facility]    HERNIA REPAIR  2016    HYSTERECTOMY  2012    PELVIC LAPAROSCOPY  2011    UMBILICAL HERNIA REPAIR  2016    UPPER GASTROINTESTINAL ENDOSCOPY  2009    WISDOM TOOTH EXTRACTION  1998       Labs     Results        Procedure Component Value Units Date/Time    Magnesium [[id number]] Collected: 08/27/22 0239    Specimen: Blood Updated: 08/27/22 0740    Phosphorus [[id number]] Collected: 08/27/22 0239    Specimen: Blood Updated: 08/27/22 0740    Lactic Acid [[id number]] Collected: 08/27/22 0444    Specimen: Blood Updated: 08/27/22 0503     Lactic Acid 2.0 mmol/L     Sedimentation rate (ESR) [[id number]] Collected: 08/27/22 0239    Specimen: Blood Updated: 08/27/22 0501     Sed Rate 18 mm/Hr     Comprehensive metabolic panel [[id number]] Collected: 08/27/22 0239    Specimen: Blood Updated: 08/27/22 0331     Glucose 99 mg/dL      BUN 24.4 mg/dL      Creatinine 0.7 mg/dL      Sodium 010 mEq/L      Potassium 4.1 mEq/L      Chloride 107 mEq/L      CO2 19 mEq/L      Calcium 9.6 mg/dL      Protein, Total 7.0 g/dL      Albumin 3.6 g/dL      AST (SGOT) 27 U/L      ALT 45 U/L      Alkaline Phosphatase 65 U/L      Bilirubin, Total 0.3 mg/dL      Globulin 3.4 g/dL      Albumin/Globulin Ratio 1.1     Anion Gap 11.0     eGFR >60.0 mL/min/1.73 m2     C Reactive Protein [[id number]] Collected: 08/27/22 0239    Specimen: Blood Updated: 08/27/22 0328     C-Reactive Protein 0.1 mg/dL     CBC and differential [[id number]]  (Abnormal) Collected: 08/27/22 0239    Specimen: Blood Updated: 08/27/22 0318     WBC 11.33 x10 3/uL      Hgb 12.2 g/dL      Hematocrit 63.8 %      Platelets 353 x10 3/uL      RBC 3.98 x10 6/uL      MCV 92.2 fL      MCH 30.7 pg  MCHC 33.2 g/dL      RDW 13 %      MPV 8.7 fL      Instrument Absolute Neutrophil Count 7.74 x10 3/uL      Neutrophils 68.3 %      Lymphocytes Automated 24.4 %      Monocytes 6.3 %      Eosinophils Automated 0.4 %      Basophils Automated 0.3 %      Immature Granulocytes 0.3 %      Nucleated RBC 0.0 /100 WBC      Neutrophils Absolute 7.74 x10 3/uL      Lymphocytes Absolute Automated 2.77 x10 3/uL      Monocytes Absolute Automated 0.71 x10 3/uL      Eosinophils Absolute Automated 0.05 x10 3/uL       Basophils Absolute Automated 0.03 x10 3/uL      Immature Granulocytes Absolute 0.03 x10 3/uL      Absolute NRBC 0.00 x10 3/uL     Lactic Acid [[id number]]  (Abnormal) Collected: 08/27/22 0239    Specimen: Blood Updated: 08/27/22 0304     Lactic Acid 2.2 mmol/L          Home Medications     Prior to Admission medications    Medication Sig Start Date End Date Taking? Authorizing Provider   Adalimumab 40 MG/0.4ML Pen-injector Kit Inject 0.4 mLs (40 mg) into the skin once a week 08/04/22 08/04/23 Yes McBride, Altamease Oiler, MD   Apremilast [place]) 30 MG Tab Take 1 tablet (30 mg) by mouth 2 (two) times daily  Patient taking differently: Take 1 tablet (30 mg) by mouth 2 (two) times daily 05/05/22  Yes Baltazar Najjar, MD   Azelastine-Fluticasone [phone] MCG/ACT Suspension 1 spray by Each Nare route 2 (two) times daily as needed 03/05/21  Yes [provider]   clindamycin (CLEOCIN T) 1 % lotion Apply topically every morning 11/25/21  Yes [provider]   colesevelam (WELCHOL) 625 MG tablet Take 1 tablet (625 mg) by mouth 2 (two) times daily with meals  Patient taking differently: Take 2 tablets (1,250 mg) by mouth every morning 12/02/21  Yes Virgilio Frees, MD   cyclobenzaprine (FLEXERIL) 5 MG tablet Take 1 tablet for muscle spasms at night as needed 08/04/22  Yes Baltazar Najjar, MD   Dapsone 7.5 % Gel Apply topically every morning 10/12/21  Yes [provider]   fexofenadine (Allegra Allergy) 180 MG tablet Take 1 tablet (180 mg) by mouth daily 02/21/22  Yes [provider]   hydrOXYzine (ATARAX) 10 MG tablet Take 1 tablet (10 mg) by mouth nightly 12/02/21  Yes Virgilio Frees, MD   Rimegepant Sulfate 75 MG Tablet Dispersible Take 1 tablet (75 mg) by mouth every other day  Patient taking differently: Take 1 tablet (75 mg) by mouth every other day Night 04/29/22  Yes Bertram Denver, MD   rizatriptan (MAXALT) 10 MG tablet Take 1-2 tablets (10-20 mg total) by mouth once as needed for Migraine  Max 3 days a week 06/14/21  Yes Vivi Martens, MD PhD   valACYclovir (VALTREX) 1000 MG tablet Take 2 tabs at onset; repeat once in 12 hours.  Total 4 tabs per outbreak 07/25/22  Yes Virgilio Frees, MD   botulinum Toxin Type A (Botox) 200 units Recon Soln injection Inject 155 units IM every 12 weeks 03/15/22   Bertram Denver, MD   busPIRone (BUSPAR) 5 MG tablet TAKE 1 TABLET (5 MG) BY MOUTH  3 (THREE) TIMES DAILY AS NEEDED (ANXIETY) 03/20/22   Born, Humberto Leep, MD   EPINEPHrine (EPIPEN 2-PAK) 0.3 MG/0.3ML Solution Auto-injector injection Inject 0.3 mLs (0.3 mg) into the muscle once as needed (Anaphylaxis) ; go to ER after injection 12/02/21 12/02/22  Virgilio Frees, MD   lidocaine-prilocaine (EMLA) cream Apply topically as needed Dermatology procedures 11/23/21   [provider]   Multiple Vitamins-Minerals (MULTIVITAMIN ADULT PO) Take by mouth nightly    [provider]   Omega-3 1000 MG Cap Take by mouth nightly    [provider]   ondansetron (ZOFRAN-ODT) 4 MG disintegrating tablet TAKE 1 TABLET (4 MG) BY MOUTH EVERY 8 (EIGHT) HOURS AS NEEDED FOR NAUSEA 04/25/22   Virgilio Frees, MD   vitamin D (CHOLECALCIFEROL) 25 MCG (1000 UT) tablet Take 1 tablet (1,000 Units) by mouth nightly    [provider]   aspirin EC 325 MG tablet Take 1 tablet (325 mg) by mouth daily 06/08/22 08/27/22  [name], MD       Radiology Studies: (actual Orthopaedic relevant films interpreted & Radiology Reports reviewed by Orthopaedics)     (Personal Interpretation)  XR of the hip demonstrates preserved joint space without evidence of fx or dislocation    XR Hip left 2-3 vw with pelvis    Result Date: 08/27/2022   No acute abnormality. [name], MD 08/27/2022 1:49 AM          Physical Exam:   Patient is a 41 y.o. year old female who is alert, well appearing, and in no distress.  Orientation: Fully Oriented    BP 149/81   Pulse 93   Temp 98.3 F (36.8 C) (Oral)   Resp 17   Ht 1.727 m (5\' 8" )    Wt 142.9 kg (315 lb)   LMP  (LMP Unknown)   SpO2 97%   BMI 47.90 kg/m   142.9 kg (315 lb)   1.727 m (5\' 8" )    Gait: deferred    Heart: normal rate  Lungs: non-labored breathing  Abdomen: soft, non-tender        Right Upper Extremity:   Inspection:  No swelling, erythema, deformity, atrophy or hypertrophy noted  Palpation:  Tenderness- No tenderness to palpation throughout extremity.   ROM:  Full ROM of shoulder, elbow, wrist, hand without pain.   Strength: Intact motor strength to distal hand to median, radial, ulnar nerves.   Skin: normal   Peripheral Vascular: radial pulse present  Sensation: Intact sensation to median, radial, ulnar nerve distributions.           Right Lower Extremity:   Inspection:  No swelling, erythema, deformity, atrophy or hypertrophy noted  Palpation:   No tenderness to palpation throughout extremity.   ROM:  Full ROM of the hip, knee, ankle, foot without pain.   Joint Stability: normal joint stability to the hip, knee, ankle, midfoot.   Strength: Intact motor strength to IP, Q, hamstrings, AT, GS, EHL, FHL.   Skin: normal   Peripheral Vascular: Tibialis posterior pulse: present and Dorsalis pedis pulse: present  Sensation: Intact gross sensation to DPN, SPN, TN distributions.        Left Upper Extremity:   Inspection:  No swelling, erythema, deformity, atrophy or hypertrophy noted  Palpation:  Tenderness- No tenderness to palpation throughout extremity.   ROM:  Full ROM of shoulder, elbow, wrist, hand without pain.   Strength: Intact motor strength to distal hand to median, radial, ulnar nerves.   Skin:  normal   Peripheral Vascular: radial pulse present  Sensation: Intact sensation to median, radial, ulnar nerve distributions.           Left Lower Extremity:   Inspection:  No swelling, erythema, deformity, atrophy or hypertrophy noted  Palpation:   TTP about the hip  ROM:  knee, ankle, foot without pain. Any attempted flexion of the hip or logroll produces considerable discomfort  especially at terminal internal rotation  Strength: Intact motor strength to IP, Q, hamstrings, AT, GS, EHL, FHL, however proximal muscle groups limited by pain   Skin: normal   Peripheral Vascular: Tibialis posterior pulse: present and Dorsalis pedis pulse: present  Sensation: Intact gross sensation to DPN, SPN, TN distributions.        Pelvis:   Skin: normal  Palpation: Tenderness- none  Stability: normal       N.B. The review of the patient's medications does not in any way constitute an endorsement, by this clinician, of their use, dosage, indications, route, efficacy, interactions, or other clinical parameters.This is also true of any test,lab,or other review that this clinician did not order.    This note was generated within the EPIC EMR using Dragon medical speech recognition software and may contain inherent errors or omissions not intended by the user. Grammatical and punctuation errors, random word insertions, deletions, pronoun errors and incomplete sentences are occasional consequences of this technology due to software limitations. Not all errors are caught or corrected.  Although every attempt is made to root out erroneus and incomplete transcription, the note may still not fully represent the intent or opinion of the author. If there are questions or concerns about the content of this note or information contained within the body of this dictation they should be addressed directly with the author for clarification.

## 2022-08-27 NOTE — H&P (Signed)
ADMISSION HISTORY AND PHYSICAL EXAM    Date Time: 08/27/22 4:56 AM  Patient Name: [name]  Attending Physician: Terese Door, MD  Primary Care Physician: Virgilio Frees, MD    CC: Left hip pain      Assessment:    41 y.o. 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    Plan:   #Left Hip Pain  -MRI of hip to rule out any mechanical causes and to evaluate for any joint swelling    #Leukocytosis  Likely reactive. WBC mildly elevated   -ED consulted ortho for concerns for possible septic. -Ortho stated that if patient needs joint aspiration to be done with CT guided.-she will need an order if she needs a joint aspiration.  -On physical examination, patient does not look septic, she remains afebrile and the joint is not warm or tender to touch, I do not think she needs urgent aspiration. Will first evaluate with MRI before aspiration  -will keep NPO in case MRI show swelling and patient will need joint aspiration for further evaluation  -will cover for any type of infection with vanc and zosyn    #Lactic Acidosis -resolved  S/p 1 L NS      #Nausea  Zofran prn    #HTN  Most likely in setting of pain  Pain control: given tylenol 1000 mg, toradol 30 mg, morphine 4 mg  Will have tylenol prn,  iv dialudid prn for pain    Chronic  #RA  On humira  Will hold in sestting of any possible infection    Bechet  -skin: clindamycin and dapsone    #PVC s/p Ablation  -will get ekg  in setting of mild tachycardia    #WPW    #Anxiety  Buspar    #Migraine  On home Rimegeprant-not here in hospital  On botox injections  Sumatriptan prn    #Hx of HSV  -valacyclovir    #hx of cholecystectomy   -Welchol        DVT Ppx: holding in setting in case she needs aspiration  Diet: NPO  Telemetry: yes  Code Status: Discussion had with patient Code status is  Full          Disposition: (Please see PAF column for Expected D/C Date)   Today's date: 08/27/2022  Admit Date: 08/26/2022 11:29 PM  Service status: Inpatient: risk of progressive disease  Clinical Milestones: Hip pain  Anticipated discharge needs: MRI    History of Presenting Illness:   Gerarda Conklin is a 41 y.o. 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    She states that it is a 10/10 and got worse yesterday. She states it shooting pain into hip but not down the leg. No fever. She states she has always low grade fevers in setting of behcets but her temperature has not increased during this period. She states she was not able to bear  weight as the pain was intoelrable. She did not notice any swelling. She feels that this is different from RA. No reject injury. No shooting pain down the leg. No recent infections.    Past Medical History:     Past Medical History:   Diagnosis Date  Abnormal Pap smear of cervix     Behcet's disease     She sees Dr. Adin Hector    BMI 45.0-49.9, adult     Diarrhea     occasional // poss. r/t meds    Headache     on Rx    Lower back pain     Meningitis 2011    Viral // treated and resolved    Neck pain     PT// Improved    Post-operative nausea and vomiting     PVC (premature ventricular contraction)     Scoliosis     moderate// used a Brace at a young age         Past Surgical History:     Past Surgical History:   Procedure Laterality Date    ABLATION - PVC'S N/A 06/08/2022    Procedure: Ablation - PVC's;  Surgeon: [name], MD;  Location: [facility];  Service: Cardiovascular;  Laterality: N/A;  SAME DAY DISCHARGE  CARTO NO TEE    ARTHROTOMY, WRIST Right 12/03/2020    Procedure: RIGHT UPPER EXTREMITY SYNOVIAL BIOPSY;  Surgeon: Kellie Simmering, MD;  Location: [place] [facility];  Service: Plastics;  Laterality: Right;    BIOPSY, LYMPH NODE N/A  07/23/2018    Procedure: BIOPSY, LYMPH NODE;  Surgeon: Shelda Pal, MD;  Location: [place] [facility];  Service: ENT;  Laterality: N/A;  NECK DEEP LYMPH NODE BIOPSY    BONE MARROW BIOPSY  2020    CARDIAC ABLATION  2005    [facility] for Wolff-Park[facility]    CHOLECYSTECTOMY  2009    D&C DIAGNOSTIC  2010    DILATION AND CURETTAGE OF UTERUS  2010    FINGER GANGLION CYST EXCISION  04/2018    GANGLION CYST EXCISION  2014    04/2018 cyst removed from index finger-[facility]    HERNIA REPAIR  2016    HYSTERECTOMY  2012    PELVIC LAPAROSCOPY  2011    UMBILICAL HERNIA REPAIR  2016    UPPER GASTROINTESTINAL ENDOSCOPY  2009    WISDOM TOOTH EXTRACTION  1998       Family History:   Adopted does not known    Social History:     Social History     Tobacco Use   Smoking Status Never   Smokeless Tobacco Never     Social History     Substance and Sexual Activity   Alcohol Use Not Currently    Alcohol/week: 0.0 standard drinks of alcohol    Comment: rarely     Social History     Substance and Sexual Activity   Drug Use Never       Allergies:     Allergies   Allergen Reactions    Fruit Blend Flavor [Flavoring Agent] Anaphylaxis and Swelling    Latex Other (See Comments), Hives and Swelling     Swelling and redness throughout body   Also severe allergy to surgical glue    Adhesive [Wound Dressing Adhesive] Itching     dermabond    Other Itching and Rash     Sutures Monocryl // and Tropical fruits like coconuts, pineapple, kiwi, etc.    Oxycodone Hallucinations       Medications:     Home Medications               Adalimumab 40 MG/0.4ML Pen-injector Kit     Inject 0.4 mLs (40 mg) into the skin once a week  Apremilast (Otezla) 30 MG Tab     Take 1 tablet (30 mg) by mouth 2 (two) times daily     Patient taking differently: Take 1 tablet (30 mg) by mouth 2 (two) times daily     Azelastine-Fluticasone 137-50 MCG/ACT Suspension     1 spray by Each Nare route 2 (two) times daily as needed      botulinum Toxin Type A (Botox) 200 units Recon Soln injection     Inject 155 units IM every 12 weeks     busPIRone (BUSPAR) 5 MG tablet     TAKE 1 TABLET (5 MG) BY MOUTH 3 (THREE) TIMES DAILY AS NEEDED (ANXIETY)     clindamycin (CLEOCIN T) 1 % lotion     Apply topically every morning     colesevelam (WELCHOL) 625 MG tablet     Take 1 tablet (625 mg) by mouth 2 (two) times daily with meals     Patient taking differently: Take 2 tablets (1,250 mg) by mouth every morning     cyclobenzaprine (FLEXERIL) 5 MG tablet     Take 1 tablet for muscle spasms at night as needed     Dapsone 7.5 % Gel     Apply topically every morning     EPINEPHrine (EPIPEN 2-PAK) 0.3 MG/0.3ML Solution Auto-injector injection     Inject 0.3 mLs (0.3 mg) into the muscle once as needed (Anaphylaxis) ; go to ER after injection     fexofenadine (Allegra Allergy) 180 MG tablet     Take 1 tablet (180 mg) by mouth daily     hydrOXYzine (ATARAX) 10 MG tablet     Take 1 tablet (10 mg) by mouth nightly     lidocaine-prilocaine (EMLA) cream     Apply topically as needed Dermatology procedures     Multiple Vitamins-Minerals (MULTIVITAMIN ADULT PO)     Take by mouth nightly     Omega-3 1000 MG Cap     Take by mouth nightly     ondansetron (ZOFRAN-ODT) 4 MG disintegrating tablet     TAKE 1 TABLET (4 MG) BY MOUTH EVERY 8 (EIGHT) HOURS AS NEEDED FOR NAUSEA     Rimegepant Sulfate 75 MG Tablet Dispersible     Take 1 tablet (75 mg) by mouth every other day     Patient taking differently: Take 1 tablet (75 mg) by mouth every other day Night     rizatriptan (MAXALT) 10 MG tablet     Take 1-2 tablets (10-20 mg total) by mouth once as needed for Migraine Max 3 days a week     valACYclovir (VALTREX) 1000 MG tablet     Take 2 tabs at onset; repeat once in 12 hours.  Total 4 tabs per outbreak     vitamin D (CHOLECALCIFEROL) 25 MCG (1000 UT) tablet     Take 1 tablet (1,000 Units) by mouth nightly                  Method by which medications were confirmed on admission: with  patient    Review of Systems:   All other systems were reviewed and are negative except: as stated in HPI    Physical Exam:   Patient Vitals for the past 24 hrs:   BP Temp Temp src Pulse Resp SpO2 Height Weight   08/27/22 0354 -- -- -- 96 -- -- -- --   08/27/22 0223 128/59 -- -- 100 -- 96 % -- --   08/26/22 2345 142/74 -- --  99 19 97 % -- --   08/26/22 2218 (!) 144/114 98 F (36.7 C) Oral 100 20 100 % 1.727 m (5\' 8" ) 142.9 kg (315 lb)     Body mass index is 47.9 kg/m.  No intake or output data in the 24 hours ending 08/27/22 0456    General: awake, alert, oriented x 3; no acute distress.  HEENT: perrla, eomi, sclera anicteric  oropharynx clear without lesions, mucous membranes moist  Neck: supple, no lymphadenopathy, no thyromegaly, no JVD, no carotid bruits  Cardiovascular: regular rate and rhythm, no murmurs, rubs or gallops  Lungs: clear to auscultation bilaterally, without wheezing, rhonchi, or rales  Abdomen: soft, non-tender, non-distended; no palpable masses, no hepatosplenomegaly, normoactive bowel sounds, no rebound or guarding  Extremities: no clubbing, cyanosis, or edema  MSK: left hip is non tender to palpation, normal temperature, non erythematous, motion limited by pain in all directions. Right hip normal examination. Strength   Neuro: cranial nerves grossly intact, strength 5/5 in upper and lower extremities, sensation intact,   Skin: no rashes or lesions noted        Labs:     Results       Procedure Component Value Units Date/Time    Lactic Acid [[id number]] Collected: 08/27/22 0444    Specimen: Blood Updated: 08/27/22 0445    Narrative:      No tourniquet;on ice    Comprehensive metabolic panel [[id number]] Collected: 08/27/22 0239    Specimen: Blood Updated: 08/27/22 0331     Glucose 99 mg/dL      BUN 78.2 mg/dL      Creatinine 0.7 mg/dL      Sodium 956 mEq/L      Potassium 4.1 mEq/L      Chloride 107 mEq/L      CO2 19 mEq/L      Calcium 9.6 mg/dL      Protein, Total 7.0 g/dL      Albumin 3.6 g/dL       AST (SGOT) 27 U/L      ALT 45 U/L      Alkaline Phosphatase 65 U/L      Bilirubin, Total 0.3 mg/dL      Globulin 3.4 g/dL      Albumin/Globulin Ratio 1.1     Anion Gap 11.0     eGFR >60.0 mL/min/1.73 m2     C Reactive Protein [[id number]] Collected: 08/27/22 0239    Specimen: Blood Updated: 08/27/22 0328     C-Reactive Protein 0.1 mg/dL     CBC and differential [[id number]]  (Abnormal) Collected: 08/27/22 0239    Specimen: Blood Updated: 08/27/22 0318     WBC 11.33 x10 3/uL      Hgb 12.2 g/dL      Hematocrit 41.3 %      Platelets 353 x10 3/uL      RBC 3.98 x10 6/uL      MCV 92.2 fL      MCH 30.7 pg      MCHC 33.2 g/dL      RDW 13 %      MPV 8.7 fL      Instrument Absolute Neutrophil Count 7.74 x10 3/uL      Neutrophils 68.3 %      Lymphocytes Automated 24.4 %      Monocytes 6.3 %      Eosinophils Automated 0.4 %      Basophils Automated 0.3 %      Immature Granulocytes 0.3 %  Nucleated RBC 0.0 /100 WBC      Neutrophils Absolute 7.74 x10 3/uL      Lymphocytes Absolute Automated 2.77 x10 3/uL      Monocytes Absolute Automated 0.71 x10 3/uL      Eosinophils Absolute Automated 0.05 x10 3/uL      Basophils Absolute Automated 0.03 x10 3/uL      Immature Granulocytes Absolute 0.03 x10 3/uL      Absolute NRBC 0.00 x10 3/uL     Lactic Acid [[id number]]  (Abnormal) Collected: 08/27/22 0239    Specimen: Blood Updated: 08/27/22 0304     Lactic Acid 2.2 mmol/L     Sedimentation rate (ESR) [[id number]] Collected: 08/27/22 0239    Specimen: Blood Updated: 08/27/22 0250            Imaging personally reviewed, including: XR Hip left 2-3 vw with pelvis    Result Date: 08/27/2022   No acute abnormality. [name], MD 08/27/2022 1:49 AM      Safety Checklist  DVT prophylaxis:  CHEST guideline (See page [id number]) Chemical-on hold in setting of possible procedure   Foley:  [place] Rn Foley protocol Not present   IVs:  Peripheral IV   PT/OT: Ordered   Daily CBC & or Chem ordered:  SHM/ABIM guidelines (see #5) Yes, due to clinical and  lab instability       Signed by: Waunita Schooner, DO  NW:[id number], Ephriam Knuckles, MD

## 2022-08-27 NOTE — Progress Notes (Signed)
MEDICINE PROGRESS NOTE    Date Time: 08/27/22 7:39 AM  Patient Name: Hailey [name]  Attending Physician: Larena Sox, MD    Assessment:   This is 41 year old female with history Behcet's of and seronegative rheumatoid arthritis, WPW, ectopy induced myopathy with EF of 40% status post PVC ablation on 06/08/2022.  Also had migraine and presented to the hospital with left hip pain of 2 days concerning for rheumatoid flare.    Left hip pain  Leukocytosis  Seronegative RA on Humira  Lactic acidosis  History of WPW  History of PVC ablation  History of hypertension  History of Behcet's  Anxiety  Migraine  History of HSV on valacyclovir  Left adnexal cyst  Plan:   Vital signs stable overnight.  Labs reviewed slight leukocytosis at 11.3 and platelets slightly up at 353 otherwise labs look unremarkable.  X-ray of the hip with no acute abnormality noted    Left hip pain  Patient admitted an MRI ordered for pain control Tylenol, Toradol and morphine ordered.  Ortho evaluated patient overnight concern for possible septic arthritis.  Recommended CT-guided joint aspiration.    Afternoon update  MRI updated: MRI shows trace left joint effusion without significant synovitis.  Tear of left anterior superior acetabular labrum.  Mild to moderate left the distal gluteus minimus tendonitis.   For better pain control patient was started on tramadol standing dose.  Patient stated that she is allergic to oxycodone with severe hallucination which lingers for days to weeks.  Waiting for Ortho recommendation regarding the tear and consulted rheumatology tomorrow.  Patient still on IV antibiotics including coverage for possible septic arthritis.  Case discussed with:       Additional Diagnoses:        Safety Checklist:     DVT prophylaxis:  CHEST guideline (See page e199S) Chemical   Foley:  [place] Rn Foley protocol Not present   IVs:  Peripheral IV   PT/OT: Not needed   Daily CBC & or Chem ordered:  SHM/ABIM guidelines (see #5) Yes,  due to clinical and lab instability   Reference for approximate charges of common labs: CBC auto diff - $76  BMP - $99  Mg - $79    Lines:     Patient Lines/Drains/Airways Status       Active PICC Line / CVC Line / PIV Line / Drain / Airway / Intraosseous Line / Epidural Line / ART Line / Line / Wound / Pressure Ulcer / NG/OG Tube       Name Placement date Placement time Site Days    Peripheral IV 08/27/22 20 G Standard Right Antecubital 08/27/22  0036  Antecubital  less than 1    Wound 12/03/20 Surgical Incision Arm Right splint 12/03/20  0858  Arm  631                     Disposition: (Please see PAF column for Expected D/C Date)   Today's date: 08/27/2022  Admit Date: 08/26/2022 11:29 PM  LOS: 0        Subjective     CC: Left hip pain    Interval History/24 hour events:     HPI/Subjective:     Review of Systems:     As per HPI    Physical Exam:     VITAL SIGNS PHYSICAL EXAM   Temp:  [98 F (36.7 C)-98.3 F (36.8 C)] 98.3 F (36.8 C)  Heart Rate:  [93-100] 93  Resp Rate:  [  16-20] 17  BP: (128-149)/(59-114) 149/81  Blood Glucose:    Telemetry:     No intake or output data in the 24 hours ending 08/27/22 0739 Physical Exam  General: awake, alert X 3         Meds:     Medications were reviewed:  Current Facility-Administered Medications   Medication Dose Route Frequency    colesevelam  1,250 mg Oral QAM    lidocaine  1 patch Transdermal Q24H    piperacillin-tazobactam  4.5 g Intravenous Q6H    valACYclovir  1,000 mg Oral BID    vancomycin  1,250 mg Intravenous Q12H    vancomycin   Intravenous See Admin Instructions     Current Facility-Administered Medications   Medication Dose Route Frequency Last Rate     Current Facility-Administered Medications   Medication Dose Route    acetaminophen  650 mg Oral    Or    acetaminophen  650 mg Rectal    dextrose  15 g of glucose Oral    Or    dextrose  12.5 g Intravenous    Or    dextrose  12.5 g Intravenous    Or    glucagon (rDNA)  1 mg Intramuscular    HYDROmorphone   0.2 mg Intravenous    LORazepam  0.5 mg Oral    melatonin  3 mg Oral    naloxone  0.2 mg Intravenous    ondansetron  4 mg Oral    Or    ondansetron  4 mg Intravenous    SUMAtriptan  25 mg Oral         Labs:     Labs (last 72 hours):    Recent Labs   Lab 08/27/22  0239   WBC 11.33*   Hgb 12.2   Hematocrit 36.7   Platelets 353*          Recent Labs   Lab 08/27/22  0239   Sodium 137   Potassium 4.1   Chloride 107   CO2 19   BUN 12.0   Creatinine 0.7   Calcium 9.6   Albumin 3.6   Protein, Total 7.0   Bilirubin, Total 0.3   Alkaline Phosphatase 65   ALT 45   AST (SGOT) 27   Glucose 99                   Microbiology, reviewed and are significant for:  Microbiology Results (last 15 days)       ** No results found for the last 360 hours. **                Signed by: Larena Sox, MD

## 2022-08-27 NOTE — Progress Notes (Signed)
Vancomycin Day #1  Indication: Empiric coverage  Maintenance Dose: 1250 mg q12h   Target Guidance: AUC 400-600 mg/L hr  Level due: 10/22 with AM labs  Other anti-infectives: Zosyn  Age: 41 y.o.  Recent Labs     08/27/22  0239   WBC 11.33*   Creatinine 0.7   BUN 12.0     Wt Readings from Last 1 Encounters:   08/27/22 142.9 kg (315 lb)     Estimated Creatinine Clearance: 157.8 mL/min (based on SCr of 0.7 mg/dL).  Is renal function stable? yes  Cultures: not ordered yet  Comments/Recommendations: Recommend vancomycin 1250 mg based on the predicted kinetics below:    Regimen: 1250 mg IV every 12 hours.  Exposure target: AUC24 (range)400-600 mg/L.hr   AUC24,ss: 554 mg/L.hr  Probability of AUC24 > 400: 73 %  Ctrough,ss: 15.8 mg/L  Probability of Ctrough,ss > 20: 38 %  Probability of nephrotoxicity (Lodise CID 2009): 11 %    Thank you for this consult. Pharmacy will continue to follow this patient's progress with you until consult is discontinued.      Darrel Reach, Pharm.D.  Phone: [phone]    Reference:  Vancomycin Dosing Guideline for Adults

## 2022-08-27 NOTE — ED to IP RN Note (Signed)
[facility] HOSPITAL EMERGENCY DEPT  ED NURSING NOTE FOR THE RECEIVING INPATIENT NURSE   ED NURSE Chuck Hint [id number]   ED CHARGE RN Isaiah Serge   ADMISSION INFORMATION   Nixon Sparr is a 41 y.o. female admitted with an ED diagnosis of:    1. Left hip pain         Isolation: None   Allergies: Fruit blend flavor [flavoring agent], Latex, Adhesive [wound dressing adhesive], Other, and Oxycodone   Holding Orders confirmed? No   Belongings Documented? No   Home medications sent to pharmacy confirmed? No   NURSING CARE   Patient Comes From:   Mental Status: Home Independent  alert and oriented   ADL: Needs assistance with ADLs   Ambulation: ambulates with: cane   Pertinent Information  and Safety Concerns:     Broset Violence Risk Level: Low Pt BIBA with cc of back pain. Pt reports chronic joint pain. Pt states that pain worsened two hours PTA. Pt reports pain to left hip and "spine". Pt denies injury or trauma. Reports taking "muscle relaxer yesterday with minimal relief." Pt reports vomiting x1 due to pain. Pt denies numbness/tingling to extremities. good PMS reported.     CT / NIH   CT Head ordered on this patient?  No   NIH/Dysphagia assessment done prior to admission? No   VITAL SIGNS (at the time of this note)      Vitals:    08/27/22 0354   BP:    Pulse: 96   Resp:    Temp:    SpO2:

## 2022-08-27 NOTE — OT Eval Note (Signed)
[facility]   Occupational Therapy Cancellation Note      Patient:  Hailey [name] MRN#:  [id number]  Unit:  [facility] 7 Room/Bed:  F737/F737.01    08/27/2022  Time: 12:18 PM       OT Cancellation: Visit  OT Visit Cancellation Reason: Testing/Procedure (off unit to MRI, will follow up as appropriate and schedule allows.)       Rolla Plate, OTD, OTR/L, Pager # [phone]

## 2022-08-27 NOTE — Progress Notes (Signed)
4 eyes in 4 hours pressure injury assessment note:      Completed with: Dahlia Client, RN  Unit & Time admitted: NT7, 0630             Bony Prominences: Check appropriate box; if wound is present enter wound assessment in LDA     Occiput:                 [x] WNL  []  Wound present  Face:                     [x] WNL  []  Wound present  Ears:                      [x] WNL  []  Wound present  Spine:                    [x] WNL  []  Wound present  Shoulders:             [x] WNL  []  Wound present  Elbows:                  [x] WNL  []  Wound present  Sacrum/coccyx:     [x] WNL  []  Wound present  Ischial Tuberosity:  [x] WNL  []  Wound present  Trochanter/Hip:      [x] WNL  []  Wound present  Knees:                   [x] WNL  []  Wound present  Ankles:                   [x] WNL  []  Wound present  Heels:                    [x] WNL  []  Wound present  Other pressure areas:  []  Wound location       Device related: []  Device name:         LDA completed if wound present: yes/no  Consult WOCN if necessary    Other skin related issues, ie tears, rash, etc, document in Integumentary flowsheet

## 2022-08-27 NOTE — Plan of Care (Signed)
0630 Pt arrives from ER with husband at bedside. Oriented to room and call bell. Safety precautions in place, hourly rounding performed and all needs addressed    Pt reporting 10/10 pain. Patient declined to compete dual skin assessment and bath at this time due to patient pain. PRN dilaudid administered. MRI screening form completed with patient.     Problem: Safety  Goal: Patient will be free from injury during hospitalization  Outcome: Progressing     Problem: Pain  Goal: Pain at adequate level as identified by patient  Outcome: Progressing  Flowsheets (Taken 08/27/2022 0736)  Pain at adequate level as identified by patient:   Identify patient comfort function goal   Assess pain on admission, during daily assessment and/or before any "as needed" intervention(s)   Reassess pain within 30-60 minutes of any procedure/intervention, per Pain Assessment, Intervention, Reassessment (AIR) Cycle   Evaluate if patient comfort function goal is met   Offer non-pharmacological pain management interventions   Evaluate patient's satisfaction with pain management progress   Consult/collaborate with Pain Service   Consult/collaborate with Physical Therapy, Occupational Therapy, and/or Speech Therapy   Include patient/patient care companion in decisions related to pain management as needed   Assess for risk of opioid induced respiratory depression, including snoring/sleep apnea. Alert healthcare team of risk factors identified.     Problem: Side Effects from Pain Analgesia  Goal: Patient will experience minimal side effects of analgesic therapy  Outcome: Progressing  Flowsheets (Taken 08/27/2022 0736)  Patient will experience minimal side effects of analgesic therapy:   Monitor/assess patient's respiratory status (RR depth, effort, breath sounds)   Assess for changes in cognitive function   Prevent/manage side effects per LIP orders (i.e. nausea, vomiting, pruritus, constipation, urinary retention, etc.)   Evaluate for  opioid-induced sedation with appropriate assessment tool (i.e. POSS)     Problem: Pain interferes with ability to perform ADL  Goal: Pain at adequate level as identified by patient  Outcome: Progressing  Flowsheets (Taken 08/27/2022 0736)  Pain at adequate level as identified by patient:   Identify patient comfort function goal   Assess pain on admission, during daily assessment and/or before any "as needed" intervention(s)   Reassess pain within 30-60 minutes of any procedure/intervention, per Pain Assessment, Intervention, Reassessment (AIR) Cycle   Evaluate if patient comfort function goal is met   Offer non-pharmacological pain management interventions   Evaluate patient's satisfaction with pain management progress   Consult/collaborate with Pain Service   Consult/collaborate with Physical Therapy, Occupational Therapy, and/or Speech Therapy   Include patient/patient care companion in decisions related to pain management as needed   Assess for risk of opioid induced respiratory depression, including snoring/sleep apnea. Alert healthcare team of risk factors identified.

## 2022-08-27 NOTE — UM Notes (Signed)
08/27/22 0452  Adult Admit to Observation  Once         08/27/22 0453     Initial OBS review  Unit: Neuro    41 y.o. 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     ED Course:  VS: 98, 100, 20, 144/114, 97%  Labs: wbc 11.33, plt 353, abs neutrophils 7.74, lactic acid 2.2,   XR Left hip: negative  Meds: Valium 5 mg IV X 1, Toradol 30 mg iV X 1, Tylenol 1000 mg po X 1, Zofran 4 mg IV X 1, Morphine 4 mg IV X 1, Sodium chloride 0.9% IV 1000 ml bolus X 1     Current Facility-Administered Medications   Medication Dose Route Frequency    colesevelam  1,250 mg Oral QAM    lidocaine  1 patch Transdermal Q24H    piperacillin-tazobactam  4.5 g Intravenous Q6H    valACYclovir  1,000 mg Oral BID    vancomycin  1,250 mg Intravenous Q12H    vancomycin   Intravenous See Admin Instructions     Continuous Infusions:  PRN Meds:.acetaminophen **OR** acetaminophen, dextrose **OR** dextrose **OR** dextrose **OR** glucagon (rDNA), HYDROmorphone, LORazepam, melatonin, naloxone, ondansetron **OR** ondansetron, SUMAtriptan    Per Orthopedics:  Recommend MRI w/ contrast of L hip  -Recommend CT guided aspiration of the hip  -WBAT   -Recommend multimodal pain control and PTOT as able    Plan:  #Left Hip Pain  -MRI of hip to rule out any mechanical causes and to evaluate for any joint swelling     #Leukocytosis  Likely reactive. WBC mildly elevated   -ED consulted ortho for concerns for possible septic. -Ortho stated that if patient needs joint aspiration to be done with CT guided.-she will need an order if she needs a joint aspiration.  -On physical examination, patient does not look septic, she remains afebrile and the joint is not warm or tender to touch, I do not think she needs urgent aspiration. Will first evaluate with MRI before aspiration  -will keep NPO in case MRI show swelling and  patient will need joint aspiration for further evaluation  -will cover for any type of infection with vanc and zosyn    Pollyann Kennedy, RN, BSN CNRN  UR Case Manager   Utilization Review  [facility]   [address]  [address]  [place], [place] [address]  Phone : [phone]   [place] [phone]  Fax: [phone]    [email or web address]

## 2022-08-28 DIAGNOSIS — Z8679 Personal history of other diseases of the circulatory system: Secondary | ICD-10-CM

## 2022-08-28 LAB — VANCOMYCIN, RANDOM: Vancomycin Random: 14.2 ug/mL

## 2022-08-28 LAB — CBC AND DIFFERENTIAL
Absolute NRBC: 0 10*3/uL (ref 0.00–0.00)
Basophils Absolute Automated: 0.04 10*3/uL (ref 0.00–0.08)
Basophils Automated: 0.4 %
Eosinophils Absolute Automated: 0.18 10*3/uL (ref 0.00–0.44)
Eosinophils Automated: 1.8 %
Hematocrit: 37.8 % (ref 34.7–43.7)
Hgb: 12.5 g/dL (ref 11.4–14.8)
Immature Granulocytes Absolute: 0.03 10*3/uL (ref 0.00–0.07)
Immature Granulocytes: 0.3 %
Instrument Absolute Neutrophil Count: 5.97 10*3/uL (ref 1.10–6.33)
Lymphocytes Absolute Automated: 2.97 10*3/uL (ref 0.42–3.22)
Lymphocytes Automated: 30.4 %
MCH: 31.2 pg (ref 25.1–33.5)
MCHC: 33.1 g/dL (ref 31.5–35.8)
MCV: 94.3 fL (ref 78.0–96.0)
MPV: 8.7 fL — ABNORMAL LOW (ref 8.9–12.5)
Monocytes Absolute Automated: 0.59 10*3/uL (ref 0.21–0.85)
Monocytes: 6 %
Neutrophils Absolute: 5.97 10*3/uL (ref 1.10–6.33)
Neutrophils: 61.1 %
Nucleated RBC: 0 /100 WBC (ref 0.0–0.0)
Platelets: 348 10*3/uL — ABNORMAL HIGH (ref 142–346)
RBC: 4.01 10*6/uL (ref 3.90–5.10)
RDW: 13 % (ref 11–15)
WBC: 9.78 10*3/uL — ABNORMAL HIGH (ref 3.10–9.50)

## 2022-08-28 LAB — BASIC METABOLIC PANEL
Anion Gap: 11 (ref 5.0–15.0)
BUN: 10 mg/dL (ref 7.0–21.0)
CO2: 20 mEq/L (ref 17–29)
Calcium: 8.9 mg/dL (ref 8.5–10.5)
Chloride: 106 mEq/L (ref 99–111)
Creatinine: 0.7 mg/dL (ref 0.4–1.0)
Glucose: 96 mg/dL (ref 70–100)
Potassium: 4 mEq/L (ref 3.5–5.3)
Sodium: 137 mEq/L (ref 135–145)
eGFR: >60 mL/min/{1.73_m2} (ref >=60)

## 2022-08-28 MED ORDER — KETOROLAC TROMETHAMINE 30 MG/ML IJ SOLN
15.0000 mg | Freq: Four times a day (QID) | INTRAMUSCULAR | Status: DC
Start: 2022-08-28 — End: 2022-08-29
  Administered 2022-08-28 – 2022-08-29 (×5): 15 mg via INTRAVENOUS
  Filled 2022-08-28 (×5): qty 1

## 2022-08-28 MED ORDER — HYDROMORPHONE HCL 2 MG PO TABS
1.0000 mg | ORAL_TABLET | ORAL | Status: DC | PRN
Start: 2022-08-28 — End: 2022-08-29
  Administered 2022-08-28 (×3): 1 mg via ORAL
  Filled 2022-08-28 (×3): qty 1

## 2022-08-29 DIAGNOSIS — M352 Behcet's disease: Secondary | ICD-10-CM

## 2022-08-29 DIAGNOSIS — Z79899 Other long term (current) drug therapy: Secondary | ICD-10-CM

## 2022-08-29 DIAGNOSIS — D84821 Immunodeficiency due to drugs: Secondary | ICD-10-CM

## 2022-08-29 DIAGNOSIS — M25552 Pain in left hip: Secondary | ICD-10-CM

## 2022-08-29 DIAGNOSIS — M1990 Unspecified osteoarthritis, unspecified site: Secondary | ICD-10-CM

## 2022-08-29 LAB — CBC AND DIFFERENTIAL
Absolute NRBC: 0 10*3/uL (ref 0.00–0.00)
Basophils Absolute Automated: 0.03 10*3/uL (ref 0.00–0.08)
Basophils Automated: 0.4 %
Eosinophils Absolute Automated: 0.27 10*3/uL (ref 0.00–0.44)
Eosinophils Automated: 3.3 %
Hematocrit: 37.6 % (ref 34.7–43.7)
Hgb: 12.2 g/dL (ref 11.4–14.8)
Immature Granulocytes Absolute: 0.03 10*3/uL (ref 0.00–0.07)
Immature Granulocytes: 0.4 %
Instrument Absolute Neutrophil Count: 4.43 10*3/uL (ref 1.10–6.33)
Lymphocytes Absolute Automated: 3 10*3/uL (ref 0.42–3.22)
Lymphocytes Automated: 36.2 %
MCH: 30.6 pg (ref 25.1–33.5)
MCHC: 32.4 g/dL (ref 31.5–35.8)
MCV: 94.2 fL (ref 78.0–96.0)
MPV: 8.6 fL — ABNORMAL LOW (ref 8.9–12.5)
Monocytes Absolute Automated: 0.52 10*3/uL (ref 0.21–0.85)
Monocytes: 6.3 %
Neutrophils Absolute: 4.43 10*3/uL (ref 1.10–6.33)
Neutrophils: 53.4 %
Nucleated RBC: 0 /100 WBC (ref 0.0–0.0)
Platelets: 350 10*3/uL — ABNORMAL HIGH (ref 142–346)
RBC: 3.99 10*6/uL (ref 3.90–5.10)
RDW: 13 % (ref 11–15)
WBC: 8.28 10*3/uL (ref 3.10–9.50)

## 2022-08-29 LAB — BASIC METABOLIC PANEL
Anion Gap: 13 (ref 5.0–15.0)
BUN: 10 mg/dL (ref 7.0–21.0)
CO2: 20 mEq/L (ref 17–29)
Calcium: 9 mg/dL (ref 8.5–10.5)
Chloride: 104 mEq/L (ref 99–111)
Creatinine: 0.7 mg/dL (ref 0.4–1.0)
Glucose: 91 mg/dL (ref 70–100)
Potassium: 3.8 mEq/L (ref 3.5–5.3)
Sodium: 137 mEq/L (ref 135–145)
eGFR: >60 mL/min/{1.73_m2} (ref >=60)

## 2022-08-29 MED ORDER — TRAMADOL HCL 50 MG PO TABS
50.0000 mg | ORAL_TABLET | Freq: Three times a day (TID) | ORAL | 0 refills | Status: DC | PRN
Start: 2022-08-29 — End: 2022-09-04

## 2022-08-29 MED ORDER — TRAMADOL HCL 50 MG PO TABS
50.0000 mg | ORAL_TABLET | Freq: Three times a day (TID) | ORAL | 0 refills | Status: DC | PRN
Start: 2022-08-29 — End: 2022-08-29

## 2022-08-29 MED ORDER — FAMOTIDINE 20 MG PO TABS
20.0000 mg | ORAL_TABLET | Freq: Two times a day (BID) | ORAL | 0 refills | Status: DC | PRN
Start: 2022-08-29 — End: 2022-09-04

## 2022-08-29 MED ORDER — IBUPROFEN 600 MG PO TABS
600.0000 mg | ORAL_TABLET | Freq: Three times a day (TID) | ORAL | 0 refills | Status: DC | PRN
Start: 2022-08-29 — End: 2022-10-27

## 2022-08-29 NOTE — PT Progress Note (Addendum)
Physical Therapy Treatment Hailey [name]  Post Acute Care Therapy Recommendations:     Discharge Recommendations:  Home with supervision, Home with home health PT  D/C Milestones: none     DME needs IF patient is discharging home: Front wheel walker, Shower chair, Transport chair    Therapy discharge recommendations may change with patient status.  Please refer to most recent note for up-to-date recommendations.      Assessment:   Significant Findings: none    Pt received supine in bed and agreeable to participation with PT session. Emphasis placed on gait training and transfer training - including simulated stair negotiation. Pt able to complete ambulation with supervision and RW and simulated stairs with SBA/CGA. Slow, step-to sequence with no LOB or limb buckling. VC for optimal sequencing, AD management and recommended supervision / hands on assist from spouse - primarily with stairs. Also discussed gradual activity progression and importance of continued mobility with pt and spouse verbalizing understanding. L hip pain of 5-6/10 throughout. Pt will continue to benefit from acute PT services to maximize safety and independence with mobility prior to discharge.     Assessment: Decreased LE strength, Decreased endurance/activity tolerance, Gait impairment, Decreased balance, Decreased functional mobility  Progress: Progressing toward goals  Prognosis: Good, With continued PT status post acute discharge  Risks/Benefits/POC Discussed with Pt/Family: With patient/family    Rehab tech present: none    Treatment Activities: Gait training     Educated the patient to role of physical therapy, plan of care, goals of therapy and safety with mobility and ADLs.    Plan:   Treatment/Interventions: Exercise, Gait training, Stair training, Functional transfer training, LE strengthening/ROM, Patient/family training, Equipment eval/education, Bed mobility, Compensatory technique education, Continued evaluation    PT  Frequency: 4-5x/wk    Continue plan of care.    Unit: [facility] TOWER 7  Bed: F737/F737.01     Precautions and Contraindications:   Falls   LLE WBAT per ortho     Updated Medical Status/Imaging/Labs: Reviewed     Subjective:    "It does feel better to the right"  Patient's medical condition is appropriate for Physical Therapy intervention at this time.  Patient is agreeable to participation in the therapy session. Nursing clears patient for therapy.    Pain Assessment: numeric scale   Pain score: 5 at rest 6 with mobility   Pain location: L hip   Pain intervention(s): Repositioned , Rest , and Nursing informed     Objective:   Patient is in bed with PIV access in place.    Cognition  Arousal/alertness: Appropriate response to stimuli  Attention span: Appears intact   Behavior: Calm  and Cooperative     Functional Mobility  Supine to sit: Supervision   Sit to stand: Supervision  with Rolling walker    Stand to sit: Supervision  with Rolling walker    Cueing required: VC/TC for AD management , upright posture, sequencing , LE activation, hand placement, and Anterior weight shifting     PMP - Progressive Mobility Protocol   PMP Activity: Step 6 - Walks in Room  Distance Walked (ft) (Step 6,7): 35 Feet (one seated rest break between trials)     Ambulation  Distance: see above  Device: Rolling walker    Assist level: Supervision   Stairs: up / down 1 step x 3 simulated with floor mat with BUE support from bed railing and SBA/CGA.   Pattern: decreased cadence, decreased step length ,  decreased foot clearance, decreased heel-toe gait, step to, and discontinuous steps   Cueing required: VC/TC for AD management , upright posture, sequencing , LE activation, and hand placement      Participation and Activity Tolerance  Participation effort: Good  Patient endurance: Good    Patient left in bed with call bell within reach, all needs met and all questions answered.  SCDs off as found, fall mat in place, bed  alarm on, chair alarm n/a   RN notified of session outcome and patient response.     PPE worn by therapist: gloves, surgical mask   PPE worn by patient: none     Goals  Goal Formulation: With patient/family  Time for Goal Acheivement: 5 visits  Goals: Select goal  Pt Will Go Supine To Sit: modified independent (HOB flat)  Pt Will Perform Sit To Supine: modified independent (HOB flat)  Pt Will Perform Sit to Stand: modified independent  Pt Will Transfer Bed/Chair: with rolling walker, modified independent  Pt Will Ambulate: 51-100 feet, with rolling walker, with supervision  Pt Will Go Up / Down Stairs: 3-5 stairs, With rail, with contact guard assist    Time of Treatment:  PT Received On: 08/29/22  Start Time: 1214  Stop Time: 1245  Time Calculation (min): 31 min  Treatment # 1 out of 5 visits    [name], DPT, [phone]

## 2022-08-29 NOTE — Progress Notes (Signed)
still looking for HH due to out of network( insurance). I already spoke with pt was informed that getting a prescription from PCP is also possible option as an outpatient.  verbalized understand. Will f/u

## 2022-08-29 NOTE — Progress Notes (Signed)
08/29/22 1110   Case Management Quick Doc   Acknowledgment of Outpatient/Observation Observation letter given   CMA Tasks   CMA tasks MOON delivered

## 2022-08-29 NOTE — Consults (Addendum)
CONSULTATION    Date Time: 08/29/22 1:46 PM  Patient Name: Hailey [name], Hailey [name]  Requesting Physician: Larena Sox, MD      Reason for Consultation:   L hip pain    Assessment:   Hailey [name] is a 41 y.o. female with PMH of WPW s/p ablation, fatty liver, morbid obesity, Behcets disease with seroneg arthritis on humira and otezla now admitted with acute L hip pain.     MRI hip 10/21 with trace effusion, but L acetabular labrum tear noted. No synovitis. Also with L gluteus tendinosis and trochanteric bursitis.     Plan:   L hip pain- acute  - MRI - labrum tear- without synovitis noted,agree with PT  - ortho team signed off, no intervention, rec outpt sports ortho team  - MRI not c/w arthritic flare - no need of escalation of IS meds at this time      2. Bechets  - manifested by apthous ulcers, inflam arthritis, rash, fevers-- stable currently   - controlled with humira/otezla  - can resume home meds given no evidence of infection: otezla 30mg /bid, and humira 40mg /SC injection every 7 days (due 10/21), can obtain at home today v/s inpt here if avail.     3. Seronegative arthritis  - likely part of her Bechets   - cont home meds      Dispo:  -Ok for discharge home from rheum perspective.   - Has f/up with Dr. Adin Hector on 11/17    Discussed plan with pt and husband at bedside, and Dr. Diona Fanti    History:   Hailey [name] is a 41 y.o. female who presents to the hospital on 08/26/2022 with PMH of WPW s/p ablation, fatty liver, morbid obesity, Behcets disease with seroneg arthritis on humira and otezla now admitted with acute L hip pain.     MRI hip 10/21 with trace effusion, but L acetabular labrum tear noted. No synovitis. Also with L gluteus tendinosis and trochanteric bursitis.      Upon d/w pt, pain is 50% better already.  Minimal lateral hip pain.  No other joint symptoms.  No fevers.  Skin stable.   Recently seen at outpt rheum and humira frequency was increased from q10 days to q7 days last  visit on 9/28.  Pt was due to have her humira injection on Saturday while admitted here but due to work up of possible septic joint, it was held.      Past Medical History:     Past Medical History:   Diagnosis Date    Abnormal Pap smear of cervix     Behcet's disease     She sees Dr. Adin Hector    BMI 45.0-49.9, adult     Diarrhea     occasional // poss. r/t meds    Headache     on Rx    Lower back pain     Meningitis 2011    Viral // treated and resolved    Neck pain     PT// Improved    Post-operative nausea and vomiting     PVC (premature ventricular contraction)     Scoliosis     moderate// used a Brace at a young age       Past Surgical History:     Past Surgical History:   Procedure Laterality Date    ABLATION - PVC'S N/A 06/08/2022    Procedure: Ablation - PVC's;  Surgeon: [name], MD;  Location: [facility];  Service: Cardiovascular;  Laterality: N/A;  SAME DAY DISCHARGE  CARTO NO TEE    ARTHROTOMY, WRIST Right 12/03/2020    Procedure: RIGHT UPPER EXTREMITY SYNOVIAL BIOPSY;  Surgeon: Kellie Simmering, MD;  Location: [facility] OR;  Service: Plastics;  Laterality: Right;    BIOPSY, LYMPH NODE N/A 07/23/2018    Procedure: BIOPSY, LYMPH NODE;  Surgeon: Shelda Pal, MD;  Location: [facility];  Service: ENT;  Laterality: N/A;  NECK DEEP LYMPH NODE BIOPSY    BONE MARROW BIOPSY  2020    CARDIAC ABLATION  2005    [facility] for Wolff-Parkinson-[facility]    CHOLECYSTECTOMY  2009    D&C DIAGNOSTIC  2010    DILATION AND CURETTAGE OF UTERUS  2010    FINGER GANGLION CYST EXCISION  04/2018    GANGLION CYST EXCISION  2014    04[facility]    HERNIA REPAIR  2016    HYSTERECTOMY  2012    PELVIC LAPAROSCOPY  2011    UMBILICAL HERNIA REPAIR  2016    UPPER GASTROINTESTINAL ENDOSCOPY  2009    WISDOM TOOTH EXTRACTION  1998       Family History:     Family History   Adopted: Yes       Social History:     Social History     Socioeconomic History    Marital  status: Married     Spouse name: Not on file    Number of children: 0    Years of education: Not on file    Highest education level: Not on file   Occupational History    Occupation: Attorney    Tobacco Use    Smoking status: Never    Smokeless tobacco: Never   Vaping Use    Vaping Use: Never used   Substance and Sexual Activity    Alcohol use: Not Currently     Alcohol/week: 0.0 standard drinks of alcohol     Comment: rarely    Drug use: Never    Sexual activity: Yes     Partners: Male     Birth control/protection: None     Comment: Hysterectomy   Other Topics Concern    Not on file   Social History Narrative    Diet: Non specific         Caffeine per day: tea 1 cup         ACD: N        Medical POA: N        DNR: N        Exercise: N     Social Determinants of Health     Financial Resource Strain: Low Risk  (02/23/2022)    Overall Financial Resource Strain (CARDIA)     Difficulty of Paying Living Expenses: Not hard at all   Food Insecurity: No Food Insecurity (02/23/2022)    Hunger Vital Sign     Worried About Running Out of Food in the Last Year: Never true     Ran Out of Food in the Last Year: Never true   Transportation Needs: No Transportation Needs (02/23/2022)    PRAPARE - Therapist, art (Medical): No     Lack of Transportation (Non-Medical): No   Physical Activity: Unknown (02/23/2022)    Exercise Vital Sign     Days of Exercise per Week: Patient refused     Minutes of Exercise per Session: 10 min  Recent Concern: Physical Activity - Insufficiently Active (01/11/2022)    Exercise Vital Sign     Days of Exercise per Week: 2 days     Minutes of Exercise per Session: 10 min   Stress: Stress Concern Present (02/23/2022)    [employer] of Occupational Health - Occupational Stress Questionnaire     Feeling of Stress : Rather much   Social Connections: Moderately Integrated (02/23/2022)    Social Connection and Isolation Panel [NHANES]     Frequency of Communication with Friends and  Family: Twice a week     Frequency of Social Gatherings with Friends and Family: Once a week     Attends Religious Services: More than 4 times per year     Active Member of [employer] or Organizations: No     Attends Banker Meetings: Never     Marital Status: Married   Catering manager Violence: Not At Risk (02/23/2022)    Humiliation, Afraid, Rape, and Kick questionnaire     Fear of Current or Ex-Partner: No     Emotionally Abused: No     Physically Abused: No     Sexually Abused: No   Housing Stability: Low Risk  (02/23/2022)    Housing Stability Vital Sign     Unable to Pay for Housing in the Last Year: No     Number of Places Lived in the Last Year: 1     Unstable Housing in the Last Year: No       Allergies:     Allergies   Allergen Reactions    Fruit Blend Flavor [Flavoring Agent] Anaphylaxis and Swelling    Latex Other (See Comments), Hives and Swelling     Swelling and redness throughout body   Also severe allergy to surgical glue    Adhesive [Wound Dressing Adhesive] Itching     dermabond    Other Itching and Rash     Sutures Monocryl // and Tropical fruits like coconuts, pineapple, kiwi, etc.    Oxycodone Hallucinations       Medications:     Current Facility-Administered Medications   Medication Dose Route Frequency    colesevelam  1,250 mg Oral QAM    ketorolac  15 mg Intravenous 4 times per day    lidocaine  1 patch Transdermal Q24H    traMADol  50 mg Oral TID    valACYclovir  1,000 mg Oral BID       Review of Systems:   All systems reviewed and negative except as per HPI     Physical Exam:     Vitals:    08/29/22 1117   BP: 135/74   Pulse: 85   Resp: 17   Temp: 98.5 F (36.9 C)   SpO2: 96%       Intake and Output Summary (Last 24 hours) at Date Time  No intake or output data in the 24 hours ending 08/29/22 1346    General appearance - alert, well appearing, and in no distress  Mental status - alert, oriented to person, place, and time  Eyes - pupils equal and reactive, extraocular eye movements  intact  Chest - clear to auscultation, no wheezes, rales or rhonchi, symmetric air entry  Heart - normal rate, regular rhythm, normal S1, S2, no murmurs, rubs, clicks or gallops  Abdomen - soft, nontender, nondistended, no masses or organomegaly  Neurological - alert, oriented, normal speech, no focal findings or movement disorder noted  Musculoskeletal - limited ROM of  L hip due to pain. No ttp over lateral trochanteric area.  No synovitis noted. Mild puffiness of hands.   Extremities - peripheral pulses normal, no pedal edema, no clubbing or cyanosis  Skin - slight facial flushing/redness. Otw normal coloration and turgor, no rashes, no suspicious skin lesions noted    Labs Reviewed:     Results       Procedure Component Value Units Date/Time    Basic Metabolic Panel [[id number]] Collected: 08/29/22 0319    Specimen: Blood Updated: 08/29/22 0410     Glucose 91 mg/dL      BUN 40.9 mg/dL      Creatinine 0.7 mg/dL      Calcium 9.0 mg/dL      Sodium 811 mEq/L      Potassium 3.8 mEq/L      Chloride 104 mEq/L      CO2 20 mEq/L      Anion Gap 13.0     eGFR >60.0 mL/min/1.73 m2     CBC and differential [[id number]]  (Abnormal) Collected: 08/29/22 0319    Specimen: Blood Updated: 08/29/22 0401     WBC 8.28 x10 3/uL      Hgb 12.2 g/dL      Hematocrit 21.3 %      Platelets 350 x10 3/uL      RBC 3.99 x10 6/uL      MCV 94.2 fL      MCH 30.6 pg      MCHC 32.4 g/dL      RDW 13 %      MPV 8.6 fL      Nucleated RBC 0.0 /100 WBC      Absolute NRBC 0.00 x10 3/uL      Instrument Absolute Neutrophil Count 4.43 x10 3/uL      Neutrophils 53.4 %      Lymphocytes Automated 36.2 %      Monocytes 6.3 %      Eosinophils Automated 3.3 %      Basophils Automated 0.4 %      Immature Granulocytes 0.4 %      Neutrophils Absolute 4.43 x10 3/uL      Lymphocytes Absolute Automated 3.00 x10 3/uL      Monocytes Absolute Automated 0.52 x10 3/uL      Eosinophils Absolute Automated 0.27 x10 3/uL      Basophils Absolute Automated 0.03 x10 3/uL      Immature  Granulocytes Absolute 0.03 x10 3/uL                 Rads:   Radiological Procedure reviewed.     Signed by: Demetrius Revel, MD

## 2022-08-29 NOTE — Progress Notes (Signed)
DISCHARGE NOTE      Patient: Hailey [name], Hailey [name] (41 y.o., [birth year])  Admission Date: 08/26/2022 (LOS: 0)    Discharge Date: 08/29/22  Discharge Disposition: Home  Report Given: N/A  Transportation Method: Family  Discharge Medication Education Provided: Y  Additional Discharge Info: Discharge instructions given. Patient and family verbalized understanding of education which included medication administration, follow-up appointments, and when to seek emergency medical attention. IV removed from patient, escorted patient to lobby and departed with husband.      Discharge Medication List       Medication List        START taking these medications      famotidine 20 MG tablet  Commonly known as: PEPCID  Take 1 tablet (20 mg) by mouth 2 (two) times daily as needed for Heartburn (with advil)     ibuprofen 600 MG tablet  Commonly known as: ADVIL  Take 1 tablet (600 mg) by mouth every 8 (eight) hours as needed for Pain     traMADol 50 MG tablet  Commonly known as: ULTRAM  Take 1 tablet (50 mg) by mouth every 8 (eight) hours as needed for Pain            CONTINUE taking these medications      Adalimumab 40 MG/0.4ML Pnkt  Inject 0.4 mLs (40 mg) into the skin once a week     Allegra Allergy 180 MG tablet  Generic drug: fexofenadine     Azelastine-Fluticasone 137-50 MCG/ACT Susp     Botox 200 units Solr injection  Generic drug: botulinum Toxin Type A  Inject 155 units IM every 12 weeks     busPIRone 5 MG tablet  Commonly known as: BUSPAR  TAKE 1 TABLET (5 MG) BY MOUTH 3 (THREE) TIMES DAILY AS NEEDED (ANXIETY)     clindamycin 1 % lotion  Commonly known as: CLEOCIN T     colesevelam 625 MG tablet  Commonly known as: WELCHOL  Take 1 tablet (625 mg) by mouth 2 (two) times daily with meals     cyclobenzaprine 5 MG tablet  Commonly known as: FLEXERIL  Take 1 tablet for muscle spasms at night as needed     Dapsone 7.5 % Gel     EPINEPHrine 0.3 MG/0.3ML Soaj injection  Inject 0.3 mLs (0.3 mg) into the muscle once as needed  (Anaphylaxis) ; go to ER after injection     hydrOXYzine 10 MG tablet  Commonly known as: ATARAX  Take 1 tablet (10 mg) by mouth nightly     lidocaine-prilocaine cream  Commonly known as: EMLA     MULTIVITAMIN ADULT PO     Omega-3 1000 MG Caps     ondansetron 4 MG disintegrating tablet  Commonly known as: ZOFRAN-ODT  TAKE 1 TABLET (4 MG) BY MOUTH EVERY 8 (EIGHT) HOURS AS NEEDED FOR NAUSEA     Otezla 30 MG Tabs  Generic drug: Apremilast  Take 1 tablet (30 mg) by mouth 2 (two) times daily     Rimegepant Sulfate 75 MG Tbdp  Take 1 tablet (75 mg) by mouth every other day     rizatriptan 10 MG tablet  Commonly known as: MAXALT  Take 1-2 tablets (10-20 mg total) by mouth once as needed for Migraine Max 3 days a week     valACYclovir 1000 MG tablet  Commonly known as: VALTREX  Take 2 tabs at onset; repeat once in 12 hours.  Total 4 tabs per outbreak     vitamin D  25 MCG (1000 UT) tablet  Commonly known as: CHOLECALCIFEROL               Where to Get Your Medications        These medications were sent to [id number] - [place], [place] - [address] [place], [address] [facility] [address]  [address] [place], [place] [address]      Phone: [phone]   famotidine 20 MG tablet  ibuprofen 600 MG tablet  traMADol 50 MG tablet

## 2022-08-29 NOTE — OT Progress Note (Signed)
[facility]   Occupational Therapy Cancellation Note      Patient:  Hailey [name] MRN#:  [id number]  Unit:  [facility] TOWER 7 Room/Bed:  F737/F737.01    08/29/2022  Time: 12:48 PM       OT Cancellation: Visit  OT Visit Cancellation Reason: Testing/Procedure (PT at bedside, will follow up as schedule allows and appropriate.)         Rolla Plate, OTD, OTR/L, Pager # [phone]

## 2022-08-29 NOTE — Progress Notes (Signed)
Patient medically cleared to discharge home with [facility] PT (pending accepting agency) and front wheel walker provided per PT rec. Family to transport patient home.    Joette Catching, LMSW  [facility]  Social Work Case Manager 1  [phone]

## 2022-08-29 NOTE — Progress Notes (Signed)
08/29/22 1642   CMA Tasks   CMA tasks DME delivered     Case Management Department     CMS received [employer] equipment Rx for patient Hailey [name] from the Case Management team.       CMS delivered patient Hailey [name] to room @ 4:07pm.     CMS emailed equipment Rx form to [employer].    Comer Locket  Case Management Specialist  Case Management Department  [facility]  [email or web address]

## 2022-08-29 NOTE — OT Eval Note (Signed)
Occupational Therapy Eval Hailey [name]        Post Acute Care Therapy Recommendations:     Discharge Recommendations:  Home with supervision, Home with home health OT (hands on assist from husband as needed at d/c)  DME needs IF patient is discharging home: Front wheel walker, Shower chair    Therapy discharge recommendations may change with patient status.  Please refer to most recent note for up-to-date recommendations.    Assessment:   Significant Findings: None    Hailey [name] is a 41 y.o. female admitted 08/26/2022 with L anterosuperior acetabular labrum tear. WBAT LLE per ortho.  Patient presents with impairments in LLE AROM, strength, and coordination; balance; endurance; and significant pain negatively impacting functional performance for ADLs and household mobility at baseline level. Grossly SBA for OOB mobility and standing ADLs at sink side, increased independent-MIN A for ADLs as below. Facilitated initial education and training on energy conservation/activity modification for ADLs and mobility, AD/DME recommendations, LB adaptive equipment recommendations, and ADL retraining / bed mobility modifications as below. Pt repositioned to comfort, all needs met.  Recommend continued skilled acute OT services to maximize independence with functional performance.    Therapy Diagnosis: Assessment: decreased ROM;decreased strength;balance deficits;decreased independence with ADLs;decreased independence with IADLs;decreased endurance/activity tolerance    Rehabilitation Potential: Prognosis: Good;With family;With continued OT s/p acute discharge     Treatment Activities: OT eval, ADL retraining, functional transfer training, pt and pt family education and training   Educated the patient to role of occupational therapy, plan of care, goals of therapy and safety with mobility and ADLs, energy conservation techniques, discharge instructions, home safety.    Plan:   OT Frequency Recommended:  2-3x/wk     Treatment Interventions: ADL retraining;Functional transfer training;Endurance training;Patient/Family training;Equipment eval/education;Neuro muscular reeducation;Compensatory technique education     Risks/benefits/POC discussed       Unit: [facility] TOWER 7  Bed: F737/F737.01       Precautions and Contraindications:   Precautions  Other Precautions: LLE WBAT per chart      Consult received for Hailey [name] for OT Evaluation and Treatment.  Patient's medical condition is appropriate for Occupational Therapy intervention at this time.    Admitting Diagnosis: Left hip pain [M25.552]      History of Present Illness:    Hailey [name] is a 41 y.o. 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 No surgical intervention by ortho at this time, requests MRI." per H&P      Past Medical/Surgical History:  Past Medical History:   Diagnosis Date    Abnormal Pap smear of cervix     Behcet's disease     She sees Hailey [name]    BMI 45.0-49.9, adult     Diarrhea     occasional // poss. r/t meds    Headache     on Rx    Lower back pain     Meningitis 2011    Viral // treated and resolved    Neck pain     PT// Improved    Post-operative nausea and vomiting     PVC (premature ventricular contraction)     Scoliosis     moderate// used a Brace at a young age     Past Surgical History:   Procedure Laterality Date  ABLATION - PVC'S N/A 06/08/2022    Procedure: Ablation - PVC's;  Surgeon: Hailey Hough, MD;  Location: [facility];  Service: Cardiovascular;  Laterality: N/A;  SAME DAY DISCHARGE  CARTO NO TEE    ARTHROTOMY, WRIST Right 12/03/2020    Procedure: RIGHT UPPER EXTREMITY SYNOVIAL BIOPSY;  Surgeon: Hailey Simmering, MD;  Location: [facility] TOWER OR;  Service: Plastics;  Laterality: Right;    BIOPSY, LYMPH NODE N/A  07/23/2018    Procedure: BIOPSY, LYMPH NODE;  Surgeon: Hailey Pal, MD;  Location: [facility] T[facility] OR;  Service: ENT;  Laterality: N/A;  NECK DEEP LYMPH NODE BIOPSY    BONE MARROW BIOPSY  2020    CARDIAC ABLATION  2005    [facility] for Wolff-Parkinson-White    CHOLECYSTECTOMY  2009    D&C DIAGNOSTIC  2010    DILATION AND CURETTAGE OF UTERUS  2010    FINGER GANGLION CYST EXCISION  04/2018    GANGLION CYST EXCISION  2014    04/2018 cyst removed from index finger-[facility]    HERNIA REPAIR  2016    HYSTERECTOMY  2012    PELVIC LAPAROSCOPY  2011    UMBILICAL HERNIA REPAIR  2016    UPPER GASTROINTESTINAL ENDOSCOPY  2009    WISDOM TOOTH EXTRACTION  1998       Imaging/Tests/Labs:  MRI Hip Left W WO Contrast    Result Date: 08/27/2022  1.Trace left hip joint effusion without significant synovitis. Tear of the left anterosuperior acetabular labrum. Otherwise unremarkable left hip joint. 2.Mild to moderate left distal gluteus minimus tendinosis and mild to moderate left greater trochanteric bursitis with bursal edema. There is mild left proximal hamstring tendinosis. 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 4.There has been interval enlargement of a left adnexal cyst since pelvic ultrasound 03/18/2022, now measuring 3.3 cm. This could be further evaluated with follow-up pelvic ultrasound. 5.Unchanged 3.8 cm cyst adjacent to the vaginal cuff post hysterectomy since multiple prior exams favoring a benign etiology. Hailey Darling, MD 08/27/2022 1:53 PM    XR Hip left 2-3 vw with pelvis    Result Date: 08/27/2022   No acute abnormality. 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 about a 5"      Patient is agreeable to participation in the therapy session. Family and/or guardian are agreeable to patient's participation in the therapy session. Nursing clears patient for therapy.          Pain Assessment  Pain Assessment: Numeric Scale (0-10)  Pain Score: 5-moderate pain  Pain Location:  (LLE)  Pain Intervention(s): Repositioned;Emotional support (RN notified and aware)  Objective:   Observation of Patient/Vital Signs:  Patient is in bed with IV access, fall mat, bed alarm in place.  Pt wore mask during therapy session: no     Vital Signs:  - SBP 130s, HR 80-90s, SpO2 >96% on RA     Cognitive Status and Neuro Exam:  Cognition/Neuro Status  Arousal/Alertness: Appropriate responses to stimuli  Attention Span: Appears intact  Orientation Level: Oriented X4  Memory: Appears intact  Following Commands: independent  Safety Awareness: minimal verbal instruction  Insights: Educated in safety awareness  Problem Solving: Assistance required to identify errors made;Assistance required to generate solutions  Behavior: calm;cooperative  Coordination: GMC impaired (LLE)    Neuro Status  Behavior: calm;cooperative  Coordination: GMC impaired (LLE)         Musculoskeletal Examination  Gross ROM  Right Upper Extremity ROM: within functional limits  Left Upper Extremity ROM: within functional limits  Right Lower Extremity ROM: within functional limits  Left Lower Extremity ROM:  (pain limited at  hip, reduced >25%, distally WFL)    Gross Strength  Right Upper Extremity Strength:  (4+/5)  Left Upper Extremity Strength:  (4+/5)  Right Lower Extremity Strength: within functional limits  Left Lower Extremity Strength: 3-/5 (at hip, distally WFL)              Sensory/Oculomotor Examination  Sensory  Auditory: intact  Tactile - Light Touch: intact  Visual Acuity: wears glasses (AAT, denies acute changes in vision)         Activities of Daily Living  Self-care and Home Management  Eating: Independent  Grooming: Stand by Assist;standing at sink  Bathing: Minimal Assist (for distal LLE, educated on recommendation for long handled sponge and seated completion)  UB Dressing: Independent  LB Dressing: Minimal Assist (education on recommendation for reacher and sock aide use, long handled shoe horn prn)  Toileting: Stand by Assist (w/ FWW, increased time and effort)  Functional Transfers: Stand by Assist (w/ FWW, increased time and effort)    Functional Mobility:  Mobility and Transfers  Supine to Sit: Stand by Assist  Sit to Supine: Minimal Assist (for LLE, education and training facilitated on use of long flat sheet versus leg lift for pain management and increased independence during bed mobility on flat bed surface (to simulate home))  Sit to Stand: Stand by Assist (w/ FWW, needs reminders for hand placement despite education and training)  Functional Mobility/Ambulation: Stand by Assist (w/ FWW)     PMP Activity: Step 6 - Walks in Room     Balance  Balance  Static Sitting Balance: Supervision  Dyanamic Sitting Balance: Stand by Assist  Static Standing Balance: Stand by Assist  Dynamic Standing Balance: Stand by Assist    Participation and Activity Tolerance  Participation and Endurance  Participation Effort: good  Endurance: Tolerates 10 - 20 min exercise with multiple rests    Patient left with call bell within reach, all needs met, SCDs not in place prior, fall mat in place, bed alarm engaged, chair alarm n/a and  all questions answered. RN notified of session outcome and patient response.       Goals:  Time For Goal Achievement: 3 visits  ADL Goals  Patient will groom self: Modified Independent, at sinkside  Patient will dress lower body: Modified Independent, with AE  Pt will complete bathing: Supervision  Patient will toilet: Modified Independent  Mobility and Transfer Goals  Pt will perform functional transfers: Modified Independent (w/ LRAD)  Neuro Re-Ed  Goals  Pt will perform dynamic standing balance: Supervision, for 10 minutes, to complete standing ADLs safely                        PPE worn during session: gloves, procedural mask     Tech present: n/a  PPE worn by tech: n/a    Rolla PlateLindsey McDonald, OTD, OTR/L, Pager # [phone]       Time of treatment:   OT Received On: 08/29/22   Start Time: 1450   Stop Time: 1525

## 2022-08-29 NOTE — Discharge Summary (Signed)
PROGRESS NOTE/DISCHARGE SUMMARY      Date Time: 08/29/22 3:37 PM  Patient Name: Hailey [name]  Attending Physician: Larena Sox, MD  Primary Care Physician: Virgilio Frees, MD    Date of Admission:   08/26/2022    Date of Discharge:       Discharge Dx:     Patient Active Problem List    Diagnosis Date Noted    Left hip pain 08/27/2022    PVC's (premature ventricular contractions) 06/08/2022    NICM (nonischemic cardiomyopathy) 05/17/2022    Premature ventricular contractions (PVCs) (VPCs) 03/02/2022    Palpitations 03/02/2022    S/P hysterectomy 01/14/2022    Immunocompromised state due to drug therapy 04/19/2021    Behcet's disease 02/12/2021    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    History of Wolff-Parkinson-White (WPW) syndrome 09/25/2020    Scoliosis deformity of spine 09/25/2020    Herpes labialis 01/24/2020    Non-seasonal allergic rhinitis, unspecified trigger 01/24/2020    Migraine with aura and without status migrainosus, not intractable 01/24/2020    Morbid obesity 02/02/2018        Consultations:   Treatment Team: Attending Provider: Larena Sox, MD; Social Worker: Joette Catching; Physical Therapist: Torrie Mayers, PT; Registered Nurse: Tivis Ringer, RN; Registered Nurse: Consuello Bossier, RN; Occupational Therapist: Rolla Plate, OT     Procedures/Radiology performed:     Results       Procedure Component Value Units Date/Time    Basic Metabolic Panel [[id number]] Collected: 08/29/22 0319    Specimen: Blood Updated: 08/29/22 0410     Glucose 91 mg/dL      BUN 40.9 mg/dL      Creatinine 0.7 mg/dL      Calcium 9.0 mg/dL      Sodium 811 mEq/L      Potassium 3.8 mEq/L      Chloride 104 mEq/L      CO2 20 mEq/L      Anion Gap 13.0     eGFR >60.0 mL/min/1.73 m2     CBC and differential [[id number]]  (Abnormal) Collected: 08/29/22 0319    Specimen: Blood Updated: 08/29/22 0401     WBC 8.28 x10 3/uL      Hgb 12.2 g/dL      Hematocrit 21.3 %      Platelets 350 x10 3/uL       RBC 3.99 x10 6/uL      MCV 94.2 fL      MCH 30.6 pg      MCHC 32.4 g/dL      RDW 13 %      MPV 8.6 fL      Nucleated RBC 0.0 /100 WBC      Absolute NRBC 0.00 x10 3/uL      Instrument Absolute Neutrophil Count 4.43 x10 3/uL      Neutrophils 53.4 %      Lymphocytes Automated 36.2 %      Monocytes 6.3 %      Eosinophils Automated 3.3 %      Basophils Automated 0.4 %      Immature Granulocytes 0.4 %      Neutrophils Absolute 4.43 x10 3/uL      Lymphocytes Absolute Automated 3.00 x10 3/uL      Monocytes Absolute Automated 0.52 x10 3/uL      Eosinophils Absolute Automated 0.27 x10 3/uL      Basophils Absolute Automated 0.03 x10 3/uL      Immature Granulocytes Absolute 0.03 x10 3/uL  Vancomycin, random [[id number]] Collected: 08/28/22 0304    Specimen: Blood Updated: 08/28/22 0412     Vancomycin Random 14.2 ug/mL      Vancomycin Time of Last Dose UNK     Vancomycin Date of Last Dose 08/27/2022    Basic Metabolic Panel [[id number]] Collected: 08/28/22 0304    Specimen: Blood Updated: 08/28/22 0353     Glucose 96 mg/dL      BUN 78.2 mg/dL      Creatinine 0.7 mg/dL      Calcium 8.9 mg/dL      Sodium 956 mEq/L      Potassium 4.0 mEq/L      Chloride 106 mEq/L      CO2 20 mEq/L      Anion Gap 11.0     eGFR >60.0 mL/min/1.73 m2     CBC and differential [[id number]]  (Abnormal) Collected: 08/28/22 0304    Specimen: Blood Updated: 08/28/22 0341     WBC 9.78 x10 3/uL      Hgb 12.5 g/dL      Hematocrit 46.9 %      Platelets 348 x10 3/uL      RBC 4.01 x10 6/uL      MCV 94.3 fL      MCH 31.2 pg      MCHC 33.1 g/dL      RDW 13 %      MPV 8.7 fL      Nucleated RBC 0.0 /100 WBC      Absolute NRBC 0.00 x10 3/uL      Instrument Absolute Neutrophil Count 5.97 x10 3/uL      Neutrophils 61.1 %      Lymphocytes Automated 30.4 %      Monocytes 6.0 %      Eosinophils Automated 1.8 %      Basophils Automated 0.4 %      Immature Granulocytes 0.3 %      Neutrophils Absolute 5.97 x10 3/uL      Lymphocytes Absolute Automated 2.97 x10 3/uL       Monocytes Absolute Automated 0.59 x10 3/uL      Eosinophils Absolute Automated 0.18 x10 3/uL      Basophils Absolute Automated 0.04 x10 3/uL      Immature Granulocytes Absolute 0.03 x10 3/uL     Magnesium [[id number]] Collected: 08/27/22 0239    Specimen: Blood Updated: 08/27/22 0757     Magnesium 1.9 mg/dL     Phosphorus [[id number]] Collected: 08/27/22 0239    Specimen: Blood Updated: 08/27/22 0757     Phosphorus 2.9 mg/dL     Lactic Acid [[id number]] Collected: 08/27/22 0444    Specimen: Blood Updated: 08/27/22 0503     Lactic Acid 2.0 mmol/L     Sedimentation rate (ESR) [[id number]] Collected: 08/27/22 0239    Specimen: Blood Updated: 08/27/22 0501     Sed Rate 18 mm/Hr     Comprehensive metabolic panel [[id number]] Collected: 08/27/22 0239    Specimen: Blood Updated: 08/27/22 0331     Glucose 99 mg/dL      BUN 18.8 mg/dL      Creatinine 0.7 mg/dL      Sodium 416 mEq/L      Potassium 4.1 mEq/L      Chloride 107 mEq/L      CO2 19 mEq/L      Calcium 9.6 mg/dL      Protein, Total 7.0 g/dL      Albumin 3.6 g/dL      AST (SGOT)  27 U/L      ALT 45 U/L      Alkaline Phosphatase 65 U/L      Bilirubin, Total 0.3 mg/dL      Globulin 3.4 g/dL      Albumin/Globulin Ratio 1.1     Anion Gap 11.0     eGFR >60.0 mL/min/1.73 m2     C Reactive Protein [[id number]] Collected: 08/27/22 0239    Specimen: Blood Updated: 08/27/22 0328     C-Reactive Protein 0.1 mg/dL     CBC and differential [[id number]]  (Abnormal) Collected: 08/27/22 0239    Specimen: Blood Updated: 08/27/22 0318     WBC 11.33 x10 3/uL      Hgb 12.2 g/dL      Hematocrit 78.2 %      Platelets 353 x10 3/uL      RBC 3.98 x10 6/uL      MCV 92.2 fL      MCH 30.7 pg      MCHC 33.2 g/dL      RDW 13 %      MPV 8.7 fL      Instrument Absolute Neutrophil Count 7.74 x10 3/uL      Neutrophils 68.3 %      Lymphocytes Automated 24.4 %      Monocytes 6.3 %      Eosinophils Automated 0.4 %      Basophils Automated 0.3 %      Immature Granulocytes 0.3 %      Nucleated RBC 0.0 /100 WBC       Neutrophils Absolute 7.74 x10 3/uL      Lymphocytes Absolute Automated 2.77 x10 3/uL      Monocytes Absolute Automated 0.71 x10 3/uL      Eosinophils Absolute Automated 0.05 x10 3/uL      Basophils Absolute Automated 0.03 x10 3/uL      Immature Granulocytes Absolute 0.03 x10 3/uL      Absolute NRBC 0.00 x10 3/uL     Lactic Acid [[id number]]  (Abnormal) Collected: 08/27/22 0239    Specimen: Blood Updated: 08/27/22 0304     Lactic Acid 2.2 mmol/L               Hospital Course:   This is 41 year old female with history Behcet's of and seronegative rheumatoid arthritis, WPW, ectopy induced myopathy with EF of 40% status post PVC ablation on 06/08/2022.  Also had migraine and presented to the hospital with left hip pain of 2 days concerning for rheumatoid flare and septic arthritis. MRI done with trace joint effusion but otherwise no significant synovitis. Or no evidence of Septic arthritis .   Orthopedics signed off referring for outpatient sports medicine for acetabular labrum tear. (Dr. Oscar La), no need for Aspiration based on MRI finding as per Ortho.  Rheum also evaluated pt and no concern for flare may resume Sharlynn Oliphant . Will Devon with NSIAD and toradol with Pepcid.      DISCHARGE DAY EXAM:  Patient Vitals for the past 24 hrs:   BP Temp Temp src Pulse Resp SpO2   08/29/22 1117 135/74 98.5 F (36.9 C) Oral 85 17 96 %   08/29/22 0746 141/87 98.4 F (36.9 C) Oral 85 17 97 %   08/29/22 0310 125/81 98.9 F (37.2 C) Oral 94 13 98 %   08/28/22 2321 128/82 98.6 F (37 C) Oral 82 13 95 %   08/28/22 1915 129/83 98.1 F (36.7 C) Oral 92 17 97 %  Body mass index is 47.9 kg/m.  General: awake, alert, oriented x 3; no acute distress.      Discharge Medications:        Medication List        START taking these medications      famotidine 20 MG tablet  Commonly known as: PEPCID  Take 1 tablet (20 mg) by mouth 2 (two) times daily as needed for Heartburn (with advil)     ibuprofen 600 MG tablet  Commonly known as: ADVIL  Take 1  tablet (600 mg) by mouth every 8 (eight) hours as needed for Pain     traMADol 50 MG tablet  Commonly known as: ULTRAM  Take 1 tablet (50 mg) by mouth every 8 (eight) hours as needed for Pain            CONTINUE taking these medications      Adalimumab 40 MG/0.4ML Pnkt  Inject 0.4 mLs (40 mg) into the skin once a week     Allegra Allergy 180 MG tablet  Generic drug: fexofenadine     Azelastine-Fluticasone 137-50 MCG/ACT Susp     Botox 200 units Solr injection  Generic drug: botulinum Toxin Type A  Inject 155 units IM every 12 weeks     busPIRone 5 MG tablet  Commonly known as: BUSPAR  TAKE 1 TABLET (5 MG) BY MOUTH 3 (THREE) TIMES DAILY AS NEEDED (ANXIETY)     clindamycin 1 % lotion  Commonly known as: CLEOCIN T     colesevelam 625 MG tablet  Commonly known as: WELCHOL  Take 1 tablet (625 mg) by mouth 2 (two) times daily with meals     cyclobenzaprine 5 MG tablet  Commonly known as: FLEXERIL  Take 1 tablet for muscle spasms at night as needed     Dapsone 7.5 % Gel     EPINEPHrine 0.3 MG/0.3ML Soaj injection  Inject 0.3 mLs (0.3 mg) into the muscle once as needed (Anaphylaxis) ; go to ER after injection     hydrOXYzine 10 MG tablet  Commonly known as: ATARAX  Take 1 tablet (10 mg) by mouth nightly     lidocaine-prilocaine cream  Commonly known as: EMLA     MULTIVITAMIN ADULT PO     Omega-3 1000 MG Caps     ondansetron 4 MG disintegrating tablet  Commonly known as: ZOFRAN-ODT  TAKE 1 TABLET (4 MG) BY MOUTH EVERY 8 (EIGHT) HOURS AS NEEDED FOR NAUSEA     Otezla 30 MG Tabs  Generic drug: Apremilast  Take 1 tablet (30 mg) by mouth 2 (two) times daily     Rimegepant Sulfate 75 MG Tbdp  Take 1 tablet (75 mg) by mouth every other day     rizatriptan 10 MG tablet  Commonly known as: MAXALT  Take 1-2 tablets (10-20 mg total) by mouth once as needed for Migraine Max 3 days a week     valACYclovir 1000 MG tablet  Commonly known as: VALTREX  Take 2 tabs at onset; repeat once in 12 hours.  Total 4 tabs per outbreak     vitamin D 25  MCG (1000 UT) tablet  Commonly known as: CHOLECALCIFEROL               Where to Get Your Medications        These medications were sent to [id number] - [place], [place] - [address]  [address],  [place] [address]      Phone: [phone]   famotidine 20 MG tablet  ibuprofen 600 MG tablet  traMADol 50 MG tablet         (FYI: you must refresh the link after final D/C Med reconciliation)    Discharge Instructions:   Home with walker     Disposition:  home    Patient was instructed to follow up with Primary Care Doctor Virgilio Frees, MD in 1 week   and with rheumatology     Minutes spent coordinating discharge and reviewing discharge plan:40 minutes      Signed by: Larena Sox, MD, MD    CC: Virgilio Frees, MD

## 2022-08-29 NOTE — Progress Notes (Signed)
CASE MANAGEMENT PROGRESS NOTE      Date Time: 08/29/22 10:56 AM  Patient Name: Hailey [name]  Attending Physician: Larena Sox, MD  Hospital Day: 0    Date of Admission:  08/26/2022    Reason for Admission:  Left hip pain [M25.552]    CM chart review. Rheumatology and ortho following. IV antibiotics discontinued. Patient unable to tolerate PT yesterday, plan for another assessment today. Current recs are home with PT, may improve to outpatient PT.     DISPO: Home with [facility] PT    CM following for hospital course outcomes, Interdisciplinary Team recommendations, discharge readiness and potential barriers to discharge.    Joette Catching, LMSW  [facility]  Social Work Case Manager 1  [phone]

## 2022-08-29 NOTE — Plan of Care (Signed)
Problem: Safety  Goal: Patient will be free from injury during hospitalization  Outcome: Progressing  Patient will be free from injury during hospitalization:   Assess patient's risk for falls and implement fall prevention plan of care per policy   Provide and maintain safe environment   Use appropriate transfer methods   Ensure appropriate safety devices are available at the bedside   Include patient/ family/ care giver in decisions related to safety   Hourly rounding   Assess for patients risk for elopement and implement Elopement Risk Plan per policy  Goal: Patient will be free from infection during hospitalization  Outcome: Progressing  Free from Infection during hospitalization:   Assess and monitor for signs and symptoms of infection   Monitor lab/diagnostic results   Monitor all insertion sites (i.e. indwelling lines, tubes, urinary catheters, and drains)     Problem: Pain  Goal: Pain at adequate level as identified by patient  Outcome: Progressing  Pain at adequate level as identified by patient:   Identify patient comfort function goal   Assess for risk of opioid induced respiratory depression, including snoring/sleep apnea. Alert healthcare team of risk factors identified.   Assess pain on admission, during daily assessment and/or before any "as needed" intervention(s)   Evaluate if patient comfort function goal is met   Offer non-pharmacological pain management interventions   Reassess pain within 30-60 minutes of any procedure/intervention, per Pain Assessment, Intervention, Reassessment (AIR) Cycle   Evaluate patient's satisfaction with pain management progress   Consult/collaborate with Physical Therapy, Occupational Therapy, and/or Speech Therapy   Include patient/patient care companion in decisions related to pain management as needed     Problem: Pain interferes with ability to perform ADL  Goal: Pain at adequate level as identified by patient  Outcome: Progressing  Pain at adequate level as  identified by patient:   Identify patient comfort function goal   Assess for risk of opioid induced respiratory depression, including snoring/sleep apnea. Alert healthcare team of risk factors identified.   Assess pain on admission, during daily assessment and/or before any "as needed" intervention(s)   Evaluate if patient comfort function goal is met   Offer non-pharmacological pain management interventions   Reassess pain within 30-60 minutes of any procedure/intervention, per Pain Assessment, Intervention, Reassessment (AIR) Cycle   Evaluate patient's satisfaction with pain management progress   Consult/collaborate with Physical Therapy, Occupational Therapy, and/or Speech Therapy   Include patient/patient care companion in decisions related to pain management as needed     Problem: Moderate/High Fall Risk Score >5  Goal: Patient will remain free of falls  Outcome: Progressing  Flowsheets  Taken 08/29/2022 0800 by Consuello Bossier, RN  High (Greater than 13):   HIGH-Visual cue at entrance to patient's room   HIGH-Bed alarm on at all times while patient in bed   HIGH-Apply yellow "Fall Risk" arm band   HIGH-Initiate use of floor mats as appropriate   HIGH-Consider use of low bed  VH High Risk (Greater than 13):   ALL REQUIRED LOW INTERVENTIONS   ALL REQUIRED MODERATE INTERVENTIONS   RED "HIGH FALL RISK" SIGNAGE   BED ALARM WILL BE ACTIVATED WHEN THE PATEINT IS IN BED WITH SIGNAGE "RESET BED ALARM"   A CHAIR PAD ALARM WILL BE USED WHEN PATIENT IS UP SITTING IN A CHAIR   Use of floor mat

## 2022-08-29 NOTE — Progress Notes (Signed)
Patient medically cleared to discharge home with [facility] PT (pending accepting agency) and front wheel walker provided per PT rec. Family to transport patient home.     08/29/22 1554   Discharge Disposition   Patient preference/choice provided? Yes   Physical Discharge Disposition Home, Home Health   Name of Home Health Agency Placement Other (comment box)  (Pending accpting agency)   Mode of Transportation Car   Patient/Family/POA notified of transfer plan Yes   Patient agreeable to discharge plan/expected d/c date? Yes   Family/POA agreeable to discharge plan/expected d/c date? Yes   Bedside nurse notified of transport plan? Yes   Outpatient Services   Home Health Home PT/OT/ST   CM Interventions   Follow up appointment scheduled? No   Reason no follow up scheduled? Family to schedule   Notified MD? Yes   Referral made for home health RN visit? Does not meet home bound criteria   Multidisciplinary rounds/family meeting before d/c? Yes   Medicare Checklist   Is this a Medicare patient? No     Joette Catching, LMSW  [facility]  Social Work Case Manager 1  [phone]

## 2022-08-31 NOTE — Progress Notes (Signed)
[facility] Note  Home Health Referral    Referral from  (Case Manager) for home health care upon discharge.    By [employer], the patient has the right to freely choose a home care provider.    A company of the patients choosing. We have supplied the patient with a listing of providers in your area who asked to be included and participate in Medicare.   [facility], formerly [facility], a home care agency that provides adult home care services and participates in Medicare   The preferred provider of your insurance company. Choosing a home care provider other than your insurance company's preferred provider may affect your insurance coverage.      Home Health Discharge Information    Your doctor has ordered Physical Therapy in-home service(s) for you while you recuperate at home, to assist you in the transition from hospital to home.    The agency that you or your representative chose to provide the service:    [facility] Phone: [phone]       The above services were set up by:  Daryel Gerald, RN [facility] Liaison)   Phone: [phone]    IF YOU HAVE NOT HEARD FROM YOUR HOME YOUR HOME HEALTH AGENCY WITHIN 24-48 HOURS AFTER DISCHARGE PLEASE CALL YOUR AGENCY TO ARRANGE A TIME FOR YOUR FIRST VISIT. FOR ANY SCHEDULING CONCERNS OR QUESTIONS RELATED TO HOME HEALTH, SUCH AS TIME OR DATE PLEASE CONTACT YOUR HOME HEALTH AGENCY AT THE NUMBER LISTED ABOVE.    Additional comments:        START PATIENT REGISTRATION INFORMATION     Order Information  Order Signing Physician: No att. providers found    Service Ordered RN ?: No    Service Ordered PT ?: Yes  Service Ordered OT ?: no  Service Ordered ST ?: No    Service Ordered MSW?: No    Service Ordered HHA?: No    Following Physician: Virgilio Frees, MD  Following Physician Phone: [phone]  Overseeing Physician: N/A  (Required for Residents only)   Agreeable to Follow?: Yes  Spoke with: N/A  Date/Time of Call: 08/31/22 12:41  PM      Care Coordination   [facility]?: no  Primary Care Physician:Christian Wilmon Arms, MD  Primary Care Physician Phone:[phone]  Primary Care Physician Address: [address] / [place] [address]  PCP NPI: [id number]  Visit Instructions: N/A  Service Discharge Location Type: Home  Service Facility Name: N/A  Service Floor Facility: N/A  Service Room No: N/A    Demographics  Patient Last Name: Doran Durand   Patient First Name: Wynona Canes  Language/Communication Barrier: none  and No  Service Address: [address]  [place] [address]   Service Home Phone: [phone] (home)   Other phone numbers:    Telephone Information:   Mobile [phone]     Emergency Contact: Extended Emergency Contact Information  Primary Emergency Contact: [place]  Address: [address] [place], [place] [address] [place]  Mobile Phone: [phone]  Relation: Spouse    Admission Information  Admit Date: 08/26/2022  Patient Status at discharge: Inpatient  Admitting Diagnosis: Left hip pain [M25.552]     Caregiver Information  Caregiver First Name: N/A  Caregiver Last Name: N/A  Caregiver Relationship to Patient: N/A  Caregiver Phone Number: N/A  Caregiver Notes: N/A  Data processing manager Information  Primary Subscriber:   Primary Subscriber Relation To Guarantor:   Primary Payor:   Primary Plan:   Primary Group #:    Primary Subscriber ID:    Primary Subscriber DOB:   Secondary Insurance Information  Secondary Subscriber:   Secondary Subscriber Relation To Guarantor:   Secondary Payor:   Secondary Plan:   Secondary Group #:   Secondary Subscriber ID:   Secondary Subscriber DOB:   HITECH  NO    END PATIENT REGISTRATION INFORMATION       Diagnosis:  Left hip pain [M25.552]    [facility] Summary        Additional Comments:     End [facility] Summary     Discharge Date:  08/29/22    Referral Source  Signed by: Daryel Gerald, RN  Date Time: 08/31/22 12:41 PM           Home Health  face-to-face (FTF) Encounter (Order [id number])  Consult  Date: 08/29/2022 Department: [facility] Ordering/Authorizing: Larena Sox, MD     Order Information    Order Date/Time Release Date/Time Start Date/Time End Date/Time   08/29/22 03:46 PM None 08/29/22 03:42 PM 08/29/22 03:42 PM     Order Details    Frequency Duration Priority Order Class   Once 1  occurrence Routine Hospital Performed     Standing Order Information    Remaining Occurrences Interval Last Released     0/1 Once 08/29/2022              Provider Information    Ordering User Ordering Provider Authorizing Provider   Larena Sox, MD Larena Sox, MD Larena Sox, MD   Attending Provider(s) Admitting Provider PCP   Tiajuana Amass, MD; Terese Door, MD; Larena Sox, MD Terese Door, MD Virgilio Frees, MD               Home Health face-to-face (FTF) Encounter: Patient Communication     Not Released  Not seen           Order Questions    Question Answer   Date I saw the patient face-to-face: 08/29/2022   Evidence this patient is homebound because: O.  N/A DME only   Medical conditions that necessitate Home Health care: M.  N/A DME only. No skilled services needed   Per clinical findings, following services are medically necessary: PT    DME   Clinical findings that support the need for Physical Therapy. PT will A.  Evaluate and treat functional impairment and improve mobility    C.  Educate on weight bearing status, stair/gait training, balance & coordination   DME Rolling Walker   Other (please specify) Front wheel walker, Paediatric nurse, Transport chair                    Process Instructions    Please select Home Care Services medically necessary.    Based on the above findings, I certify that this patient is confined to the home and needs intermittent skilled nursing care, physical therapry and / or speech therapy or continues to need occupational therapy. The patient is under my care, and I have  initiated the establishment of the plan of care. This patient will be followed by a physician who will periodically review the plan of care.     Collection Information            Consult Order Info  ID Description Priority Start Date Start Time   [id number] Home Health face-to-face (FTF) Encounter Routine 08/29/2022  3:42 PM   Provider Specialty Referred to   ______________________________________ _____________________________________         Acknowledgement Info    For At Acknowledged By Acknowledged On   Placing Order 08/29/22 1546 Tivis Ringeroach, Katie, RN 08/29/22 1610                           Patient Information    Patient Name  Jeananne RamaBergman, [place] Linda Legal Sex  Female DOB  September 01, 1981       Reprint Order Requisition    Home Health face-to-face (FTF) Encounter (Order [phone]#[id number]) on 08/29/22       Additional Information    Associated Reports External References   Priority and Order Details [employer]

## 2022-09-01 ENCOUNTER — Encounter (INDEPENDENT_AMBULATORY_CARE_PROVIDER_SITE_OTHER): Payer: Self-pay | Admitting: Orthopaedic Surgery

## 2022-09-01 ENCOUNTER — Encounter (INDEPENDENT_AMBULATORY_CARE_PROVIDER_SITE_OTHER): Payer: Self-pay | Admitting: Internal Medicine

## 2022-09-01 ENCOUNTER — Encounter (INDEPENDENT_AMBULATORY_CARE_PROVIDER_SITE_OTHER): Payer: Self-pay | Admitting: Family Medicine

## 2022-09-01 ENCOUNTER — Ambulatory Visit (INDEPENDENT_AMBULATORY_CARE_PROVIDER_SITE_OTHER): Payer: BLUE CROSS/BLUE SHIELD | Admitting: Orthopaedic Surgery

## 2022-09-01 VITALS — BP 158/111 | HR 69 | Ht 68.0 in | Wt 315.0 lb

## 2022-09-01 DIAGNOSIS — M25552 Pain in left hip: Secondary | ICD-10-CM

## 2022-09-01 NOTE — Progress Notes (Signed)
[facility]    Provider: Marjory Sneddon, MD  Date of Exam:  09/01/2022   Patient:  Hailey [name]  DOB:  1981-06-01    AGE:  41 y.o.  MR#:  [id number]     Chief Complaint: Left hip pain.    HPI: Hailey [name] is a pleasant 41 y.o. female who has 1 week of severe left lateral hip pain as well as low back pain.  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  Her left hip flared up severely to the point she had to be carried to the ambulance and brought to the emergency room for pain relief last week.  Since then she has been using a walker she has been discharged to home she is here to follow-up on her left hip and back pain.  Her pain is still very severe localized to the anterior superior and lateral aspect of the hip also weight migrates to the back.  She denies lower extremity weakness paresthesias.  For other past medical history, social history, family history and past surgical history please see them listed below.    Club/School/Work Affiliation: None    Problem List:   Patient Active Problem List   Diagnosis    Morbid obesity    Herpes labialis    Non-seasonal allergic rhinitis, unspecified trigger    Migraine with aura and without status migrainosus, not intractable    Post-cholecystectomy syndrome    History of Wolff-Parkinson-White (WPW) syndrome    Scoliosis deformity of spine    Behcet's disease    Immunocompromised state due to drug therapy    S/P hysterectomy    Premature ventricular contractions (PVCs) (VPCs)    Palpitations    NICM (nonischemic cardiomyopathy)    PVC's (premature ventricular contractions)    Left hip pain        Past Medical History:   Past Medical History:   Diagnosis Date    Abnormal Pap smear of cervix     Behcet's disease     She sees Dr. Adin Hector    BMI 45.0-49.9, adult     Diarrhea     occasional // poss. r/t meds    Headache     on Rx    Lower back pain     Meningitis  2011    Viral // treated and resolved    Neck pain     PT// Improved    Post-operative nausea and vomiting     PVC (premature ventricular contraction)     Scoliosis     moderate// used a Brace at a young age       Social History:   Social History     Tobacco Use    Smoking status: Never    Smokeless tobacco: Never   Vaping Use    Vaping Use: Never used   Substance Use Topics    Alcohol use: Not Currently     Alcohol/week: 0.0 standard drinks of alcohol     Comment: rarely    Drug use: Never       Family History:   Family History   Adopted: Yes       Past Surgical History:   Past Surgical History:   Procedure Laterality Date    ABLATION - PVC'S N/A 06/08/2022    Procedure: Ablation - PVC's;  Surgeon: [name], MD;  Location: [facility];  Service: Cardiovascular;  Laterality: N/A;  SAME DAY DISCHARGE  CARTO NO TEE    ARTHROTOMY, WRIST Right 12/03/2020    Procedure: RIGHT UPPER EXTREMITY SYNOVIAL BIOPSY;  Surgeon: Kellie Simmering, MD;  Location: [facility];  Service: Plastics;  Laterality: Right;    BIOPSY, LYMPH NODE N/A 07/23/2018    Procedure: BIOPSY, LYMPH NODE;  Surgeon: Shelda Pal, MD;  Location: [facility];  Service: ENT;  Laterality: N/A;  NECK DEEP LYMPH NODE BIOPSY    BONE MARROW BIOPSY  2020    CARDIAC ABLATION  2005    [facility] for Wolff-Parkinson-White    CHOLECYSTECTOMY  2009    D&C DIAGNOSTIC  2010    DILATION AND CURETTAGE OF UTERUS  2010    FINGER GANGLION CYST EXCISION  04/2018    GANGLION CYST EXCISION  2014    04[facility]    HERNIA REPAIR  2016    HYSTERECTOMY  2012    PELVIC LAPAROSCOPY  2011    UMBILICAL HERNIA REPAIR  2016    UPPER GASTROINTESTINAL ENDOSCOPY  2009    WISDOM TOOTH EXTRACTION  1998       Medications:   Current Outpatient Medications:     Adalimumab 40 MG/0.4ML Pen-injector Kit, Inject 0.4 mLs (40 mg) into the skin once a week, Disp: 8 each, Rfl: 3    Apremilast (Otezla) 30 MG Tab, Take 1  tablet (30 mg) by mouth 2 (two) times daily (Patient taking differently: Take 1 tablet (30 mg) by mouth 2 (two) times daily), Disp: 180 tablet, Rfl: 1    Azelastine-Fluticasone 137-50 MCG/ACT Suspension, 1 spray by Each Nare route 2 (two) times daily as needed, Disp: , Rfl:     botulinum Toxin Type A (Botox) 200 units Recon Soln injection, Inject 155 units IM every 12 weeks, Disp: 1 each, Rfl: 3    busPIRone (BUSPAR) 5 MG tablet, TAKE 1 TABLET (5 MG) BY MOUTH 3 (THREE) TIMES DAILY AS NEEDED (ANXIETY), Disp: 270 tablet, Rfl: 0    clindamycin (CLEOCIN T) 1 % lotion, Apply topically every morning, Disp: , Rfl:     colesevelam (WELCHOL) 625 MG tablet, Take 1 tablet (625 mg) by mouth 2 (two) times daily with meals (Patient taking differently: Take 2 tablets (1,250 mg) by mouth every morning), Disp: 180 tablet, Rfl: 3    cyclobenzaprine (FLEXERIL) 5 MG tablet, Take 1 tablet for muscle spasms at night as needed, Disp: 30 tablet, Rfl: 1    Dapsone 7.5 % Gel, Apply topically every morning, Disp: , Rfl:     EPINEPHrine (EPIPEN 2-PAK) 0.3 MG/0.3ML Solution Auto-injector injection, Inject 0.3 mLs (0.3 mg) into the muscle once as needed (Anaphylaxis) ; go to ER after injection, Disp: 1 each, Rfl: 1    famotidine (PEPCID) 20 MG tablet, Take 1 tablet (20 mg) by mouth 2 (two) times daily as needed for Heartburn (with advil), Disp: 20 tablet, Rfl: 0    fexofenadine (Allegra Allergy) 180 MG tablet, Take 1 tablet (180 mg) by mouth daily, Disp: , Rfl:     hydrOXYzine (ATARAX) 10 MG tablet, Take 1 tablet (10 mg) by mouth nightly, Disp: 90 tablet, Rfl: 3    ibuprofen (ADVIL) 600 MG tablet, Take 1 tablet (600 mg) by mouth every 8 (eight) hours as needed for Pain, Disp: 20 tablet, Rfl: 0    lidocaine-prilocaine (EMLA) cream, Apply topically as needed Dermatology procedures, Disp: , Rfl:     Multiple Vitamins-Minerals (MULTIVITAMIN ADULT PO), Take  by mouth nightly, Disp: , Rfl:     Omega-3 1000 MG Cap, Take by mouth nightly, Disp: , Rfl:      ondansetron (ZOFRAN-ODT) 4 MG disintegrating tablet, TAKE 1 TABLET (4 MG) BY MOUTH EVERY 8 (EIGHT) HOURS AS NEEDED FOR NAUSEA, Disp: 30 tablet, Rfl: 2    Rimegepant Sulfate 75 MG Tablet Dispersible, Take 1 tablet (75 mg) by mouth every other day (Patient taking differently: Take 1 tablet (75 mg) by mouth every other day Night), Disp: 16 tablet, Rfl: 5    rizatriptan (MAXALT) 10 MG tablet, Take 1-2 tablets (10-20 mg total) by mouth once as needed for Migraine Max 3 days a week, Disp: 36 tablet, Rfl: 3    traMADol (ULTRAM) 50 MG tablet, Take 1 tablet (50 mg) by mouth every 8 (eight) hours as needed for Pain, Disp: 21 tablet, Rfl: 0    valACYclovir (VALTREX) 1000 MG tablet, Take 2 tabs at onset; repeat once in 12 hours.  Total 4 tabs per outbreak, Disp: 12 tablet, Rfl: 3    vitamin D (CHOLECALCIFEROL) 25 MCG (1000 UT) tablet, Take 1 tablet (1,000 Units) by mouth nightly, Disp: , Rfl:     Allergies:   Allergies   Allergen Reactions    Fruit Blend Flavor [Flavoring Agent] Anaphylaxis and Swelling    Latex Other (See Comments), Hives and Swelling     Swelling and redness throughout body   Also severe allergy to surgical glue    Adhesive [Wound Dressing Adhesive] Itching     dermabond    Other Itching and Rash     Sutures Monocryl // and Tropical fruits like coconuts, pineapple, kiwi, etc.    Oxycodone Hallucinations       ROS:  Constitutional: No fatigue, fever, weight loss, or weight gain.   Ears, Nose, Mouth & Throat: No sore throat or hearing loss.   Cardiovascular: No chest pain, blood clots, or leg cramps.   Respiratory: No shortness of breath, cough, or difficulty breathing.   Gastrointestinal: No nausea, vomiting, diarrhea, or loss of appetite.   Genitourinary: No polyuria or kidney disease.   Musculoskeletal: No joint aches, muscle weakness or swelling of joints/body parts other than that mentioned above/below.  Integumentary: No finger nail changes or skin dryness.   Neurological: No numbness, burning  discomfort, or headaches.   Psychiatric: No depression or anxiety.   Endocrine: No increased thirst, change in appetite or thyroid disease.   Hematologic/Lymphatic: No easy bruising or anemia.     Exam:   Left Hip Exam:    Skin intact  Erythema: None  Swelling: None  Effusion: None  Deformities: None  Leg Length Discrepancy: Negative  ROM: FF: 110  ABD: 60 ER: 60  IR: 60  Drehmann's (Passive hip ER when hip flexed): Negative  IP TTP: Negative  Greater Trochanter TTP: Negative  Other TTP: None    Log Roll: Negative  Stinchfield (Resisted SLR): Positive  FADIR (impingement Test): Positive  FABER Hailey [name]): Positive  EXT/ER Posterior Impingement: Positive  Internal Snapping: Negative  External Snapping: Negative  Thomas Test: Negative  Ober Test: Negative  Popliteal Angle (**) degrees  Straight Leg Raise: Negative  Crossed Straight Leg Raise: Negative    Strength: WNL    Other areas of Tenderness: None  DTR and Pathological Reflexes Intact  No lymphadenopathy  Sensation Intact to light touch all distributions of leg and foot  DF, PF, EHL motor intact  Foot Perfused with CR <2 sec  DP/PT pulse 2+  Right Hip Exam:    Skin intact  Erythema: None  Swelling: None  Effusion: None  Deformities: None  Leg Length Discrepancy: Negative  ROM: FF: 120    ABD: 60   ER: 60   IR: 60  Drehmann's (Passive hip ER when hip flexed): Negative  IP TTP: None  Greater Trochanter TTP: None  Other TTP: None    Log Roll: Negative  Stinchfield (Resisted SLR): Negative  FADIR (impingement Test): Negative  FABER Hailey [name]): Negative  EXT/ER Posterior Impingement: Negative  Internal Snapping: Negative  External Snapping: Negative  Thomas Test: Negative  Ober Test: Negative  Popliteal Angle: WNL  Straight Leg Raise: Negative  Crossed Straight Leg Raise: Negative    Strength: WNL    Other areas of Tenderness: None  DTR and Pathological Reflexes Intact  No lymphadenopathy  Sensation Intact to light touch all distributions of leg and foot  DF, PF, EHL  motor intact  Foot Perfused with CR <2 sec  DP/PT pulse 2+    l-Spine Stance: There is negative asymmetry, negative atrophy, normal pelvic obliquity and normal spinal alignment.    Vitals: BP (!) 158/111 (BP Site: Right arm, Patient Position: Sitting, Cuff Size: Large)   Pulse 69   Ht 1.727 m (5\' 8" )   Wt 142.9 kg (315 lb)   LMP  (LMP Unknown)   BMI 47.90 kg/m     General: Hailey [name] was awake, alert, oriented, easily engaged, displayed logical thinking with clear speech and was neat in appearance. Her general appearance was normal, well-developed and well-nourished.    Gait: The patient demonstrated significantly antalgic gait with intact coordination and balance.     Studies: AP pelvis, modified dunn lateral, frog leg lateral a of the left hip from October 21 did not demonstrate any significant osseous abnormality no signs of fracture subluxation or dislocation.  Although poor quality there appears to be a very small cam abnormality.    MRI was independently reviewed by me and I agree with the radiologist stated findings of the following:IMPRESSION:      1.Trace left hip joint effusion without significant synovitis. Tear of the  left anterosuperior acetabular labrum. Otherwise unremarkable left hip  joint.     2.Mild to moderate left distal gluteus minimus tendinosis and mild to  moderate left greater trochanteric bursitis with bursal edema. There is  mild left proximal hamstring tendinosis.      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     4.There has been interval enlargement of a left adnexal cyst since pelvic  ultrasound 03/18/2022, now measuring 3.3 cm. This could be further  evaluated with follow-up pelvic ultrasound.     5.Unchanged 3.8 cm cyst adjacent to the vaginal cuff post hysterectomy  since multiple prior exams favoring a benign etiology.    Assessment/Plan: Hailey [name] is a pleasant 41 y.o. female with historical and clinical evidence of severe left low back SI and  left hip pain.  On the imaging of her hip I do not see any acute surgical abnormalities indicating the need for surgery or intervention.  Gave her my partner's contact information to consider trigger point injections possible intra-articular hip injection.  I also recommended she follow-up with her primary care physician for pain management.  Additionally she should be referred to a nonoperative spine provider she will continue with the anti-inflammatory as well as the Flexeril that was prescribed to her in the emergency room.    45 minutes were spent on this  encounter, either face-to-face with the patient and/or on the highlighted activities listed below:    Preparing for the visit (such as reviewing tests, notes, x-rays)  Getting and/or reviewing a history that was separately obtained  Performing the exam  Counseling and providing education to the patient, family, or caregiver  Documenting information in the medical record  Interpreting results and sharing that information with the patient, family or caregiver  Ordering medicines, tests, or procedures  Communicating with other healthcare professionals  Care coordination         The review of the patient's medications does not in any way constitute an endorsement, by this clinician,  of their use, dosage, indications, route, efficacy, interactions, or other clinical parameters.    This note may have been generated in part or in whole within the [facility] EMR using Dragon medical speech recognition software and may contain inherent errors or omissions not intended by the user. Grammatical and punctuation errors, random word insertions, deletions, pronoun errors and incomplete sentences are occasional consequences of this technology due to software limitations. Not all errors are caught or corrected.  Although every attempt is made to root out erroneus and incomplete transcription, the note may still not fully represent the intent or opinion of the author. If there are  questions or concerns about the content of this note or information contained within the body of this dictation they should be addressed directly with the author for clarification.

## 2022-09-04 ENCOUNTER — Other Ambulatory Visit (INDEPENDENT_AMBULATORY_CARE_PROVIDER_SITE_OTHER): Payer: Self-pay | Admitting: Family Medicine

## 2022-09-04 MED ORDER — TRAMADOL HCL 50 MG PO TABS
50.0000 mg | ORAL_TABLET | Freq: Three times a day (TID) | ORAL | 0 refills | Status: AC | PRN
Start: 2022-09-04 — End: 2022-09-11

## 2022-09-04 MED ORDER — FAMOTIDINE 20 MG PO TABS
20.0000 mg | ORAL_TABLET | Freq: Two times a day (BID) | ORAL | 0 refills | Status: DC | PRN
Start: 2022-09-04 — End: 2022-10-27

## 2022-09-06 ENCOUNTER — Encounter (INDEPENDENT_AMBULATORY_CARE_PROVIDER_SITE_OTHER): Payer: Self-pay | Admitting: Family Medicine

## 2022-09-06 ENCOUNTER — Telehealth: Payer: Self-pay

## 2022-09-06 NOTE — Telephone Encounter (Signed)
Called to reschedule Botox appt from 11/08 to 11/06 per provider.

## 2022-09-06 NOTE — Telephone Encounter (Signed)
Botox 200 units (1 vial) will be ready for pick-up at [facility] on 11/01    Appt 11/06 at [facility]

## 2022-09-07 ENCOUNTER — Telehealth (INDEPENDENT_AMBULATORY_CARE_PROVIDER_SITE_OTHER): Payer: BLUE CROSS/BLUE SHIELD | Admitting: Gastroenterology

## 2022-09-12 ENCOUNTER — Ambulatory Visit: Payer: BLUE CROSS/BLUE SHIELD | Attending: Family Nurse Practitioner | Admitting: Family Nurse Practitioner

## 2022-09-12 ENCOUNTER — Encounter: Payer: Self-pay | Admitting: Family Nurse Practitioner

## 2022-09-12 VITALS — Resp 12 | Ht 68.0 in

## 2022-09-12 DIAGNOSIS — G43109 Migraine with aura, not intractable, without status migrainosus: Secondary | ICD-10-CM

## 2022-09-12 DIAGNOSIS — M352 Behcet's disease: Secondary | ICD-10-CM | POA: Insufficient documentation

## 2022-09-12 DIAGNOSIS — G43709 Chronic migraine without aura, not intractable, without status migrainosus: Secondary | ICD-10-CM

## 2022-09-12 DIAGNOSIS — G43E09 Chronic migraine with aura, not intractable, without status migrainosus: Secondary | ICD-10-CM | POA: Insufficient documentation

## 2022-09-12 MED ORDER — RIMEGEPANT SULFATE 75 MG PO TBDP
75.0000 mg | ORAL_TABLET | ORAL | 11 refills | Status: DC
Start: 2022-09-12 — End: 2023-05-24

## 2022-09-12 MED ORDER — ONABOTULINUMTOXINA 200 UNITS IJ SOLR
155.0000 [IU] | Freq: Once | INTRAMUSCULAR | Status: AC
Start: 2022-09-12 — End: 2022-09-12
  Administered 2022-09-12: 155 [IU] via INTRAMUSCULAR

## 2022-09-12 NOTE — Progress Notes (Signed)
09/12/2022      CC: migraine    History was obtained from patient   History of Present Illness:  Hailey [name] is a 41 y.o. 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     09/12/22  Patient returns for her 3rd treatment for chronic migraine  In October she had about 2 migraines, previously at least 16 migraines prior to Botox  Patient continues use Nurtec every other day for migraine prevention and rizatriptan for acute migraine treatment  She went to the ED in October for her left hip. She will be seeing ortho on Friday. The cause is unclear at this time, unlikely RA  She is taking ibuprofen  for the hip pain      06/22/22    Patient comes in today for her second Botox treatment for chronic migraine  Today patient tells me she is noted a decrease in the severity of her migraines but reports about the same frequency  Patient had some mild pathergy after last botox administration  She estimates her 30 day headache frequency/severity profile is about 10-15/several   2 weeks ago she had a  cardiac cath for an ablation due to frequent ventricular ectopy  Patient continues Nurtec QOD and is using rizatriptan as needed       Mar 23, 2022  She presents for Botox injections. No other issues or conerns.     Notes reviewed: Rheumatology message re: pathergy and Botox injections    Mar 15 2022  Headache hx: since college  Location:bilateral and band like and left eye  Described as sharp and throbbing  Occurs:>15 HA days per month  Duration:>4 hours  Intensity: moderate to severe  Aura:none  Associated with photo/phonophobia, nausea and dizziness  Denies focal motor/sensory or visual changes.   No positional or autonomic symptoms  Exacerbating factors:unknown  Alleviating factors:unknown  Sleep:OK  Mood:high, working in coping mechanisms    Past medications tried:  Wellsite geologist  Rizatriptan  imitrex  buspar,   Topiramate: SE  reglan,   Propranolol  ineffective  hydroxyzine      HIT6:62    Records Reviewed:  Hospital records 10/21-10/23  Rheumatology progress notes 08/04/22   Dr. Thayer Jew Progress notes        Past Medical History:  Past Medical History:   Diagnosis Date    Abnormal Pap smear of cervix     Behcet's disease     She sees Dr. Adin Hector    BMI 45.0-49.9, adult     Diarrhea     occasional // poss. r/t meds    Headache     on Rx    Lower back pain     Meningitis 2011    Viral // treated and resolved    Neck pain     PT// Improved    Post-operative nausea and vomiting     PVC (premature ventricular contraction)     Scoliosis     moderate// used a Brace at a young age       Past Surgical History:  Past Surgical History:   Procedure Laterality Date    ABLATION - PVC'S N/A 06/08/2022    Procedure: Ablation - PVC's;  Surgeon: [name], MD;  Location: [facility];  Service: Cardiovascular;  Laterality: N/A;  SAME DAY DISCHARGE  CARTO NO TEE    ARTHROTOMY, WRIST Right 12/03/2020    Procedure: RIGHT UPPER EXTREMITY SYNOVIAL BIOPSY;  Surgeon: Kellie Simmering, MD;  Location:  [facility];  Service: Plastics;  Laterality: Right;    BIOPSY, LYMPH NODE N/A 07/23/2018    Procedure: BIOPSY, LYMPH NODE;  Surgeon: Shelda Pal, MD;  Location: [facility];  Service: ENT;  Laterality: N/A;  NECK DEEP LYMPH NODE BIOPSY    BONE MARROW BIOPSY  2020    CARDIAC ABLATION  2005    [facility] for Wolff-Parkinson-[facility]    CHOLECYSTECTOMY  2009    D&C DIAGNOSTIC  2010    DILATION AND CURETTAGE OF UTERUS  2010    FINGER GANGLION CYST EXCISION  04/2018    GANGLION CYST EXCISION  2014    04/2018 cyst [facility] index finger-[facility]    HERNIA REPAIR  2016    HYSTERECTOMY  2012    PELVIC LAPAROSCOPY  2011    UMBILICAL HERNIA REPAIR  2016    UPPER GASTROINTESTINAL ENDOSCOPY  2009    WISDOM TOOTH EXTRACTION  1998       Allergies:  Fruit blend flavor [flavoring agent], Latex, Adhesive [wound dressing adhesive], Other, and Oxycodone    Family  History:  Family History   Adopted: Yes     No other family history related to current complaints.    Social History:  Social History     Tobacco Use    Smoking status: Never    Smokeless tobacco: Never   Vaping Use    Vaping Use: Never used   Substance Use Topics    Alcohol use: Not Currently     Alcohol/week: 0.0 standard drinks of alcohol     Comment: rarely    Drug use: Never       Medications:  Current Outpatient Medications   Medication Sig Dispense Refill    Adalimumab 40 MG/0.4ML Pen-injector Kit Inject 0.4 mLs (40 mg) into the skin once a week 8 each 3    Apremilast (Otezla) 30 MG Tab Take 1 tablet (30 mg) by mouth 2 (two) times daily (Patient taking differently: Take 1 tablet (30 mg) by mouth 2 (two) times daily) 180 tablet 1    Azelastine-Fluticasone 137-50 MCG/ACT Suspension 1 spray by Each Nare route 2 (two) times daily as needed      botulinum Toxin Type A (Botox) 200 units Recon Soln injection Inject 155 units IM every 12 weeks 1 each 3    busPIRone (BUSPAR) 5 MG tablet TAKE 1 TABLET (5 MG) BY MOUTH 3 (THREE) TIMES DAILY AS NEEDED (ANXIETY) 270 tablet 0    clindamycin (CLEOCIN T) 1 % lotion Apply topically every morning      colesevelam (WELCHOL) 625 MG tablet Take 1 tablet (625 mg) by mouth 2 (two) times daily with meals (Patient taking differently: Take 2 tablets (1,250 mg) by mouth every morning) 180 tablet 3    cyclobenzaprine (FLEXERIL) 5 MG tablet Take 1 tablet for muscle spasms at night as needed 30 tablet 1    Dapsone 7.5 % Gel Apply topically every morning      EPINEPHrine (EPIPEN 2-PAK) 0.3 MG/0.3ML Solution Auto-injector injection Inject 0.3 mLs (0.3 mg) into the muscle once as needed (Anaphylaxis) ; go to ER after injection 1 each 1    famotidine (PEPCID) 20 MG tablet Take 1 tablet (20 mg) by mouth 2 (two) times daily as needed for Heartburn (with advil) 20 tablet 0    fexofenadine (Allegra Allergy) 180 MG tablet Take 1 tablet (180 mg) by mouth daily      hydrOXYzine (ATARAX) 10 MG tablet  Take 1 tablet (10 mg) by mouth nightly 90 tablet 3    ibuprofen (ADVIL) 600 MG tablet Take 1 tablet (600 mg) by mouth every 8 (eight) hours as needed for Pain 20 tablet 0    lidocaine-prilocaine (EMLA) cream Apply topically as needed Dermatology procedures      Multiple Vitamins-Minerals (MULTIVITAMIN ADULT PO) Take by mouth nightly      Omega-3 1000 MG Cap Take by mouth nightly      ondansetron (ZOFRAN-ODT) 4 MG disintegrating tablet TAKE 1 TABLET (4 MG) BY MOUTH EVERY 8 (EIGHT) HOURS AS NEEDED FOR NAUSEA 30 tablet 2    rizatriptan (MAXALT) 10 MG tablet Take 1-2 tablets (10-20 mg total) by mouth once as needed for Migraine Max 3 days a week 36 tablet 3    valACYclovir (VALTREX) 1000 MG tablet Take 2 tabs at onset; repeat once in 12 hours.  Total 4 tabs per outbreak 12 tablet 3    vitamin D (CHOLECALCIFEROL) 25 MCG (1000 UT) tablet Take 1 tablet (1,000 Units) by mouth nightly      Rimegepant Sulfate 75 MG Tablet Dispersible Take 1 tablet (75 mg) by mouth every other day For migraine prevention 16 tablet 11     No current facility-administered medications for this visit.       General Exam:  Resp 12   Ht 1.727 m (5\' 8" )   LMP  (LMP Unknown)   SpO2 99%   BMI 47.90 kg/m     PHYSICAL EXAM:    Gen: pleasant, NAD. Appears stated age.    HEENT: MMM, OP clear, sclera non-icteric    Respiratory: no respiratory distress; normal rhythm and effort.     MS: alert, appropriate, speech fluent, follows commands    CN: EOM intact, PERRL 3-->65mm, face symmetric, hearing intact to voice.     No lateralizing features. No pronator drift.    Gait normal    Investigations:      Imaging:  MRI Brain W WO Contrast    Result Date: 04/30/2021  Impression:  1. No intracranial abnormality is detected. MR imaging of the brain is stable with 08/13/2020. 2. There is no lesion within the spinal cord. 3. Mild cervical, thoracic and lumbar spondylosis as discussed above. Electronically signed by: Otho Ket D.O.  [Interpreted at: [facility] BG: 04/30/21    MRI Cervical Spine W WO Contrast    Result Date: 04/30/2021  Impression:  1. No intracranial abnormality is detected. MR imaging of the brain is stable with 08/13/2020. 2. There is no lesion within the spinal cord. 3. Mild cervical, thoracic and lumbar spondylosis as discussed above. Electronically signed by: Otho Ket D.O.  [Interpreted at: [facility] BG: 04/30/21    MRI Lumbar Spine W WO Contrast    Result Date: 04/30/2021  Impression:  1. No intracranial abnormality is detected. MR imaging of the brain is stable with 08/13/2020. 2. There is no lesion within the spinal cord. 3. Mild cervical, thoracic and lumbar spondylosis as discussed above. Electronically signed by: Otho Ket D.O.  [Interpreted at: [facility] BG: 04/30/21    MRI Thoracic Spine W WO Contrast    Result Date: 04/30/2021  Impression:  1. No intracranial abnormality is detected. MR imaging of the brain is stable with 08/13/2020. 2. There is no lesion within the spinal cord. 3. Mild cervical, thoracic and lumbar spondylosis as discussed above. Electronically signed by: Otho Ket D.O.  [Interpreted at: [facility]]  [facility] BG: 04/30/21        Procedure Note:  BOTOX injection is indicated for the prophylaxis of headaches in adult patients with chronic migraine.  Patient meets indications for BOTOX therapy.   Potential risks and benefits were reviewed.  Side effects including, but not limited to, potential systemic allergic reactions of the anaphylactic type as well as local injection site reactions of blepharoptosis, diplopia, infection, bleeding, pain, redness and bruising were reviewed.  The potential for headaches and/or neck pain post procedure were reviewed.   The patient's questions were answered.  The patient signed a consent form.  Patient understands that depending on their insurance carrier, there may  be a copay for this treatment.   BOTOX was reconstituted using 200 units diluted with 4 mL of sterile saline.  BOTOX was injected as per the PREEMPT trial injection paradigm with dose administered as 5 unit intramuscular (IM) injections per site using a sterile, 30-gauge 0.5 inch needle as follows:     Muscle    Dose, # of Sites   Corrugator   10 units divided in 2 sites     Procerus   5 units in 1 site     Frontalis   20 units divided in 4 sites     Temporalis   40 units divided in 8 sites     Occipitalis   30 units divided in 6 sites     Cervical paraspinal  20 units divided in 4 sites     Trapezius   30 units divided in 6 sites    Each site was cleaned with alcohol prior to injection.  A total dose of 155 units were injected.  45 units were discarded/wasted. The patient tolerated the procedure well with no immediate complications.     Medication Info:  [facility] 9528-4132-44  Lot # [id number]  Exp 12/2024      Impression:    Hailey [name] is a 40 y.o. 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  She has headaches >15 days per month and at least 8 of these are migrainous.  Migraines last >4 hours if untreated.  This pattern has continued for >3 months.  She has failed at least two preventive medications.      -- patients notes a greater than 50% reduction in her overall headache burden after her 2nd botox treatment. I therefore recommend a continuation  of Botox via the PREEMPT protocol for migraine prophylaxis.      Plan/ Recommendations:    1. Chronic migraine without aura without status migrainosus, not intractable  - botulinum Toxin Type A (BOTOX) injection 155 Units  - Rimegepant Sulfate 75 MG Tablet Dispersible; Take 1 tablet (75 mg) by mouth every other day For migraine prevention  Dispense: 16 tablet; Refill: 11    2. Migraine with aura and without status  migrainosus, not intractable  - botulinum Toxin Type A (BOTOX) injection 155 Units  - Rimegepant Sulfate 75 MG Tablet Dispersible; Take 1 tablet (75 mg) by mouth every other day For migraine prevention  Dispense: 16 tablet; Refill: 11    3. Behcet's disease      -- 155 units of Botox administered today, patient tolerated procedure well  -- continue  Nurtec 75mg  every other day for additional migraine prevention  -- for mild pain use anti-inflammatories (naproxen/ ibuprofen)   --  For moderate headaches take oral rizatriptan 10mg   -- patient counseled on limiting triptans to less than 10 days/month     Discussed the importance of lifestyle modifications:  - sleep hygiene ( keep a regular sleep schedule) maintaining appropriate hydration,   - avoid overuse of caffeine,   - eat regular meals - minimizing processed foods and emphasizing lean proteins and fruits and vegetables.     --Keep headache diary, record frequency of headache, location, duration, intensity, preceding symptoms (aura occurrence), triggers and pain relieving measures.  -- recommend Migraine Buddy app     -Remember to limit acute headache treatment medicines (like triptans or anti-inflammatories) to 2-3 days/week to avoid medication-overuse headache.      The patient should seek emergent care if there is any change in the symptoms. Proper use and all side effects of medications discussed      Please contact me with any questions. Patients and [facility] Providers can reach me via MyChart.    Dr. Fraser Din was available in a supervisory role    Lowanda Foster FNP-C  [facility]   [facility]: [phone]  [place]: [phone])- [phone]

## 2022-09-12 NOTE — Progress Notes (Signed)
Botox 200 units witness by Armstead Leandra FNP  Botox 200 units mixed with 4 ml N/S  draw in each  1 ml syringe and 30 G. Needle order read back and verified to Armstead Leandra FNP

## 2022-09-12 NOTE — Patient Instructions (Signed)
Our plan:     -You were injected with Botox today    -You may have pain and discomfort at the injection sites    -Please do not rub the injection sites    -Common side effects include headache pain, neck pain, or weakness    -If you have any side effects of concerns, please call the office and let me know    -Please track your headache days using a headache log and bring that information to your follow up visit    -Please return in 12 weeks for repeat Botox injection     Discussed the importance of lifestyle modifications:  - sleep hygiene ( keep a regular sleep schedule) maintaining appropriate hydration,   - avoid overuse of caffeine,   - eat regular meals - minimizing processed foods and emphasizing lean proteins and fruits and vegetables.     --Keep headache diary, record frequency of headache, location, duration, intensity, preceding symptoms (aura occurrence), triggers and pain relieving measures.  -- recommend Migraine Buddy app     -Remember to limit acute headache treatment medicines (like triptans or anti-inflammatories) to 2-3 days/week to avoid medication-overuse headache.         For migraine education resources:  American Headache Society: https://americanheadachesociety.org  American Migraine Foundation: https://americanmigrainefoundation.org    Today's Visit:      In today's visit, I reviewed your medications and records relating your health - prior testing, blood work, reports of other health care providers present in your electronic medical record.     If you have pertinent records from any non-[facility] doctors that you would like to review, please have them sent to us or bring to them to your next office visit.     A copy of today's visit will be sent to your referring doctor and/or primary care doctor, if you have one listed in our system.      Patient satisfaction survey:      If you receive a patient satisfaction survey, I would greatly appreciate it if you would complete it. We value your  feedback.     Contact me online:      Patient Portal online - Please sign up for MyChart -- this is the best way to communicate with your team here.  There is a messaging feature, where you can us messages anytime of day.  It is the best way to communicate with us and get test results, medication refills, or ask questions.    If you are having a medical emergency -- call 911, DO NOT SEND A MESSAGE THROUGH MYCHART.     Coupons for medication:      If you have any trouble affording your medications, check out www.goodrx.com for coupons and competitive prices in your area.  If you need further assistance, let us know so we can work with you and your insurance to make sure you get the best care.    Lea Kelis Plasse FNP-C  [facility]   [facility]: [phone]  [place]: [phone]

## 2022-09-14 ENCOUNTER — Ambulatory Visit: Payer: BLUE CROSS/BLUE SHIELD | Admitting: Family Nurse Practitioner

## 2022-09-14 ENCOUNTER — Other Ambulatory Visit (INDEPENDENT_AMBULATORY_CARE_PROVIDER_SITE_OTHER): Payer: Self-pay | Admitting: Internal Medicine

## 2022-09-14 DIAGNOSIS — M62838 Other muscle spasm: Secondary | ICD-10-CM

## 2022-09-15 ENCOUNTER — Telehealth (INDEPENDENT_AMBULATORY_CARE_PROVIDER_SITE_OTHER): Payer: BLUE CROSS/BLUE SHIELD | Admitting: Family Medicine

## 2022-09-15 ENCOUNTER — Encounter (INDEPENDENT_AMBULATORY_CARE_PROVIDER_SITE_OTHER): Payer: Self-pay | Admitting: Family Medicine

## 2022-09-15 DIAGNOSIS — I493 Ventricular premature depolarization: Secondary | ICD-10-CM

## 2022-09-15 DIAGNOSIS — M25552 Pain in left hip: Secondary | ICD-10-CM

## 2022-09-15 DIAGNOSIS — M352 Behcet's disease: Secondary | ICD-10-CM

## 2022-09-15 NOTE — Progress Notes (Signed)
[facility]    Subjective:      Date: 09/15/2022 10:31 AM   Patient ID: Hailey [name] is a 41 y.o. female.    Verbal consent has been obtained from the patient to conduct a video and telephone visit: yes    Chief Complaint:  Chief Complaint   Patient presents with    Hospital Follow-up       HPI:  HPI  Pt was recently hospitalized from 10/20-10/23 for acute on hip pain. Was evaluated for possible septic joint which was ruled out. Discharged with ortho f/u.  Saw ortho who referred her to sports. Now doing HH PT and ambulating  with cane and walker    Behcet's--Managed by rheum, on 7 days of humira, which is helping with these symptoms, feels like this is the best she has felt from this perspective.    PVCs--Planning on echo tomorrow following ablation from 3 months ago to follow up on her LV function.       Problem List:  Patient Active Problem List   Diagnosis    Morbid obesity    Herpes labialis    Non-seasonal allergic rhinitis, unspecified trigger    Migraine with aura and without status migrainosus, not intractable    Post-cholecystectomy syndrome    History of Wolff-Parkinson-White (WPW) syndrome    Scoliosis deformity of spine    Behcet's disease    Immunocompromised state due to drug therapy    S/P hysterectomy    Premature ventricular contractions (PVCs) (VPCs)    Palpitations    NICM (nonischemic cardiomyopathy)    PVC's (premature ventricular contractions)    Left hip pain       Current Medications:  Current Outpatient Medications   Medication Sig Dispense Refill    Adalimumab 40 MG/0.4ML Pen-injector Kit Inject 0.4 mLs (40 mg) into the skin once a week 8 each 3    Apremilast (Otezla) 30 MG Tab Take 1 tablet (30 mg) by mouth 2 (two) times daily (Patient taking differently: Take 1 tablet (30 mg) by mouth 2 (two) times daily) 180 tablet 1    Azelastine-Fluticasone 137-50 MCG/ACT Suspension 1 spray by Each Nare route 2 (two) times daily as needed      botulinum Toxin Type A (Botox)  200 units Recon Soln injection Inject 155 units IM every 12 weeks 1 each 3    busPIRone (BUSPAR) 5 MG tablet TAKE 1 TABLET (5 MG) BY MOUTH 3 (THREE) TIMES DAILY AS NEEDED (ANXIETY) 270 tablet 0    clindamycin (CLEOCIN T) 1 % lotion Apply topically every morning      colesevelam (WELCHOL) 625 MG tablet Take 1 tablet (625 mg) by mouth 2 (two) times daily with meals (Patient taking differently: Take 2 tablets (1,250 mg) by mouth every morning) 180 tablet 3    cyclobenzaprine (FLEXERIL) 5 MG tablet TAKE 1 TABLET FOR MUSCLE SPASMS AT NIGHT AS NEEDED 30 tablet 0    Dapsone 7.5 % Gel Apply topically every morning      EPINEPHrine (EPIPEN 2-PAK) 0.3 MG/0.3ML Solution Auto-injector injection Inject 0.3 mLs (0.3 mg) into the muscle once as needed (Anaphylaxis) ; go to ER after injection 1 each 1    famotidine (PEPCID) 20 MG tablet Take 1 tablet (20 mg) by mouth 2 (two) times daily as needed for Heartburn (with advil) 20 tablet 0    fexofenadine (Allegra Allergy) 180 MG tablet Take 1 tablet (180 mg) by mouth daily      hydrOXYzine (ATARAX) 10  MG tablet Take 1 tablet (10 mg) by mouth nightly 90 tablet 3    ibuprofen (ADVIL) 600 MG tablet Take 1 tablet (600 mg) by mouth every 8 (eight) hours as needed for Pain 20 tablet 0    lidocaine-prilocaine (EMLA) cream Apply topically as needed Dermatology procedures      Multiple Vitamins-Minerals (MULTIVITAMIN ADULT PO) Take by mouth nightly      Omega-3 1000 MG Cap Take by mouth nightly      ondansetron (ZOFRAN-ODT) 4 MG disintegrating tablet TAKE 1 TABLET (4 MG) BY MOUTH EVERY 8 (EIGHT) HOURS AS NEEDED FOR NAUSEA 30 tablet 2    Rimegepant Sulfate 75 MG Tablet Dispersible Take 1 tablet (75 mg) by mouth every other day For migraine prevention 16 tablet 11    rizatriptan (MAXALT) 10 MG tablet Take 1-2 tablets (10-20 mg total) by mouth once as needed for Migraine Max 3 days a week 36 tablet 3    valACYclovir (VALTREX) 1000 MG tablet Take 2 tabs at onset; repeat once in 12 hours.  Total 4  tabs per outbreak 12 tablet 3    vitamin D (CHOLECALCIFEROL) 25 MCG (1000 UT) tablet Take 1 tablet (1,000 Units) by mouth nightly       No current facility-administered medications for this visit.       Allergies:  Allergies   Allergen Reactions    Fruit Blend Flavor [Flavoring Agent] Anaphylaxis and Swelling    Latex Other (See Comments), Hives and Swelling     Swelling and redness throughout body   Also severe allergy to surgical glue    Adhesive [Wound Dressing Adhesive] Itching     dermabond    Other Itching and Rash     Sutures Monocryl // and Tropical fruits like coconuts, pineapple, kiwi, etc.    Oxycodone Hallucinations       Past Medical History:  Past Medical History:   Diagnosis Date    Abnormal Pap smear of cervix     Behcet's disease     She sees Dr. Adin Hector    BMI 45.0-49.9, adult     Diarrhea     occasional // poss. r/t meds    Headache     on Rx    Lower back pain     Meningitis 2011    Viral // treated and resolved    Neck pain     PT// Improved    Post-operative nausea and vomiting     PVC (premature ventricular contraction)     Scoliosis     moderate// used a Brace at a young age       Past Surgical History:  Past Surgical History:   Procedure Laterality Date    ABLATION - PVC'S N/A 06/08/2022    Procedure: Ablation - PVC's;  Surgeon: [name], MD;  Location: [facility];  Service: Cardiovascular;  Laterality: N/A;  SAME DAY DISCHARGE  CARTO NO TEE    ARTHROTOMY, WRIST Right 12/03/2020    Procedure: RIGHT UPPER EXTREMITY SYNOVIAL BIOPSY;  Surgeon: Kellie Simmering, MD;  Location: [facility];  Service: Plastics;  Laterality: Right;    BIOPSY, LYMPH NODE N/A 07/23/2018    Procedure: BIOPSY, LYMPH NODE;  Surgeon: Shelda Pal, MD;  Location: [facility];  Service: ENT;  Laterality: N/A;  NECK DEEP LYMPH NODE BIOPSY    BONE MARROW BIOPSY  2020    CARDIAC ABLATION  2005    [facility] for Wolff-Parkinson-White    CHOLECYSTECTOMY  2009    D&C DIAGNOSTIC  2010     DILATION AND CURETTAGE OF UTERUS  2010    FINGER GANGLION CYST EXCISION  04/2018    GANGLION CYST EXCISION  2014    04/2018 cyst removed from index finger-[facility]    HERNIA REPAIR  2016    HYSTERECTOMY  2012    PELVIC LAPAROSCOPY  2011    UMBILICAL HERNIA REPAIR  2016    UPPER GASTROINTESTINAL ENDOSCOPY  2009    WISDOM TOOTH EXTRACTION  1998       Family History:  Family History   Adopted: Yes       Social History:  Social History     Socioeconomic History    Marital status: Married    Number of children: 0   Occupational History    Occupation: Pensions consultant    Tobacco Use    Smoking status: Never    Smokeless tobacco: Never   Vaping Use    Vaping Use: Never used   Substance and Sexual Activity    Alcohol use: Not Currently     Alcohol/week: 0.0 standard drinks of alcohol     Comment: rarely    Drug use: Never    Sexual activity: Yes     Partners: Male     Birth control/protection: None     Comment: Hysterectomy   Social History Narrative    Diet: Non specific         Caffeine per day: tea 1 cup         ACD: N        Medical POA: N        DNR: N        Exercise: N     Social Determinants of Health     Financial Resource Strain: Low Risk  (09/01/2022)    Overall Financial Resource Strain (CARDIA)     Difficulty of Paying Living Expenses: Not hard at all   Food Insecurity: No Food Insecurity (09/01/2022)    Hunger Vital Sign     Worried About Running Out of Food in the Last Year: Never true     Ran Out of Food in the Last Year: Never true   Transportation Needs: No Transportation Needs (09/01/2022)    PRAPARE - Therapist, art (Medical): No     Lack of Transportation (Non-Medical): No   Physical Activity: Unknown (09/01/2022)    Exercise Vital Sign     Days of Exercise per Week: Patient refused     Minutes of Exercise per Session: 10 min   Stress: Stress Concern Present (09/01/2022)    [employer] of Occupational Health - Occupational Stress Questionnaire     Feeling of Stress  : To some extent   Social Connections: Socially Integrated (09/01/2022)    Social Connection and Isolation Panel [NHANES]     Frequency of Communication with Friends and Family: More than three times a week     Frequency of Social Gatherings with Friends and Family: Once a week     Attends Religious Services: More than 4 times per year     Active Member of [employer] or Organizations: No     Attends Banker Meetings: 1 to 4 times per year     Marital Status: Married   Catering manager Violence: Not At Risk (09/01/2022)    Humiliation, Afraid, Rape, and Kick questionnaire     Fear of Current or Ex-Partner: No  Emotionally Abused: No     Physically Abused: No     Sexually Abused: No   Housing Stability: Low Risk  (09/01/2022)    Housing Stability Vital Sign     Unable to Pay for Housing in the Last Year: No     Number of Places Lived in the Last Year: 1     Unstable Housing in the Last Year: No        The following sections were reviewed this encounter by the provider:   Tobacco  Allergies  Meds  Problems  Med Hx  Surg Hx  Fam Hx         ROS:  Review of Systems   Constitutional:  Negative for chills and fever.   Respiratory:  Negative for cough, shortness of breath and wheezing.    Cardiovascular:  Negative for chest pain and leg swelling.   Gastrointestinal:  Negative for abdominal pain, diarrhea, nausea and vomiting.   Musculoskeletal:  Positive for arthralgias, back pain and gait problem.   Neurological:  Negative for weakness, light-headedness, numbness and headaches.        Objective:     Physical Exam:  Physical Exam  Nursing note reviewed.   Constitutional:       Appearance: Normal appearance.   HENT:      Mouth/Throat:      Mouth: Mucous membranes are moist.   Eyes:      Extraocular Movements: Extraocular movements intact.   Pulmonary:      Effort: Pulmonary effort is normal.   Neurological:      Mental Status: She is alert.   Psychiatric:         Mood and Affect: Mood normal.          Assessment/Plan:       1. Left hip pain  - Continue with HH PT for now, but will likely need to transition to in office PT  - Will reach out to [facility] to fill out any paperwork needed for billing  - F/u with sports tomorrow as planned    2. Behcet's disease  - Improving, managed by rheum    3. Premature ventricular contractions (PVCs) (VPCs)  - TTE tomorrow per EP    Return if symptoms worsen or fail to improve.    Virgilio Frees, MD

## 2022-09-16 ENCOUNTER — Ambulatory Visit
Admission: RE | Admit: 2022-09-16 | Discharge: 2022-09-16 | Disposition: A | Discharge status: Home / Self Care | Payer: BLUE CROSS/BLUE SHIELD | Source: Ambulatory Visit | Attending: Clinical Cardiac Electrophysiology | Admitting: Clinical Cardiac Electrophysiology

## 2022-09-16 ENCOUNTER — Ambulatory Visit (INDEPENDENT_AMBULATORY_CARE_PROVIDER_SITE_OTHER): Payer: BLUE CROSS/BLUE SHIELD | Admitting: Sports Medicine

## 2022-09-16 ENCOUNTER — Telehealth (INDEPENDENT_AMBULATORY_CARE_PROVIDER_SITE_OTHER): Payer: Self-pay

## 2022-09-16 ENCOUNTER — Encounter (INDEPENDENT_AMBULATORY_CARE_PROVIDER_SITE_OTHER): Payer: Self-pay | Admitting: Sports Medicine

## 2022-09-16 VITALS — BP 146/91 | HR 92

## 2022-09-16 DIAGNOSIS — R109 Unspecified abdominal pain: Secondary | ICD-10-CM

## 2022-09-16 DIAGNOSIS — Z8742 Personal history of other diseases of the female genital tract: Secondary | ICD-10-CM

## 2022-09-16 DIAGNOSIS — M25552 Pain in left hip: Secondary | ICD-10-CM

## 2022-09-16 DIAGNOSIS — R1032 Left lower quadrant pain: Secondary | ICD-10-CM

## 2022-09-16 DIAGNOSIS — I493 Ventricular premature depolarization: Secondary | ICD-10-CM

## 2022-09-16 NOTE — Progress Notes (Signed)
[facility]       Provider: Zack Seal, MD  Date of Exam: 09/16/2022  Patient:  Hailey [name]  DOB: July 29, 1981  AGE: 41 y.o.  MR#:  [id number]    CC: Left Hip Pain    HPI:  Hailey [name] is a 41 y.o.  female with h/o Behcet's and RA who presents with c/o left lateral hip pain and abdominal pain which started on 08/24/22 without known MOI or acute event. Her pain is lateral and worse with weightbearing activities. She points to her left flank when asked about the location of the pain. She was seen in the ED and admitted for this issue. Prior MRI showed a labrum tear as well as gluteal tendinopathy and SI joint degenerative findings. There was also a chronic adnexal cyst. She was seen by my partner, Dr. Oscar La for this issue and referred to me for further evaluation and discussion of treatment options.    EXAM:   BP (!) 146/91   Pulse 92   LMP  (LMP Unknown)   Antalgic gait  Hip with FROM  FADIR-  FABER reproduces some pain in the left flank  No focal TTP over ASIS, AIIS, GTB  There is TTP in the LLQ of the abdomen and left flank just superior to the iliac crest.  The hip itself is non tender  There is no TTP over the SI joint  NV intact  SLR-    Data Reviewed  Outside Radiology:  MRI Left Hip on 08/27/22  1.Trace left hip joint effusion without significant synovitis. Tear of the left anterosuperior acetabular labrum. Otherwise unremarkable left hip joint.  2.Mild to moderate left distal gluteus minimus tendinosis and mild to moderate left greater trochanteric bursitis with bursal edema. There is mild left proximal hamstring tendinosis.   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  4.There has been interval enlargement of a left adnexal cyst since pelvic ultrasound 03/18/2022, now measuring 3.3 cm. This could be further evaluated with follow-up pelvic ultrasound.  5.Unchanged 3.8 cm cyst adjacent to the vaginal cuff post  hysterectomy since multiple prior exams favoring a benign etiology.    X-rays left hip on 08/27/22  No acute abnormality.     US pelvis transvaginal on 03/18/22  Simple vaginal cuff and bilateral ovarian cysts. A right ovarian cyst has increased in size but has no suspicious nodularity, vascularity or solid component.      ASSESSMENT/PLAN:   1. Acute left flank pain        2. Acute hip pain, left          Hailey [name] is a very pleasant 41 y.o. female with acute left hip and flank pain. Her pain seems to localize to her flank and the LLQ of her abdomen and not her hip. Her MRI shows some chronic appearing findings, but nothing that seems to explain the pain she is having based on her examination, location of pain, and reported symptoms. We had a lengthy discussion about the diagnoses as well as the various treatment options for these problems. The options of trying diagnostic injections into her hip joint, SI joint and/or GTB were discussed. However, I am skeptical that these would help at this time as she really isn't pointing to her hip. I have recommended follow-up with her OB to see if her cyst might cause symptoms like this. She was also encouraged to follow-up with her PCP. I have recommended continued  physical therapy as this seems to be helping. She will follow-up with me in 4 weeks or sooner as needed.    Teena Dunk, MD [facility]  Primary Care Sports Medicine Physician  [facility]    ------------------------------------------------------------------------------------------------------------------------------  The review of the patient's medications does not in any way constitute an endorsement, by this clinician, of their use, dosage, indications, route, efficacy, interactions, or other clinical parameters.    This note was generated within the [facility] EMR and may contain inherent errors or omissions not intended by the user. Grammatical and punctuation errors, random word insertions,  deletions, pronoun errors and incomplete sentences are occasional consequences of this technology due to software limitations. Not all errors are caught or corrected.  Although every attempt is made to root out erroneus and incomplete transcription, the note may still not fully represent the intent or opinion of the author. If there are questions or concerns about the content of this note or information contained within the body of this dictation they should be addressed directly with the author for clarification.

## 2022-09-16 NOTE — Telephone Encounter (Signed)
Pt is c/o Left Hip and LLQ Pain x 3 weeks. Pt states she went to the ER 08/26/22 and had a Left Hip MRI W/WO contrast (report in Epic) which indicated that Pt may need a follow up Pelvic U/S for interval enlargement of a left adnexal cyst since her last pelvic u/s (03/18/22), and also showed an unchanged 3.8 cm cyst adjacent to the vaginal cuff post hysterectomy. Pt reports she was also seen today by Dr. Zack Seal @ [facility] who was in agreement that Pt should follow up w/ her OBGYN Physician regarding enlargement of cyst and possible connection to her hip/LLQ pain. Advised Pt that a message will be sent to Dr. Reita May for review.     Vivia Ewing, RN

## 2022-09-19 NOTE — Telephone Encounter (Signed)
Per Dr. Reita May, Pelvic U/S Order & Ambulatory Referral to Gynecology (Dr. Alean Rinne) entered in Epic.     Vivia Ewing, RN

## 2022-09-20 ENCOUNTER — Encounter (INDEPENDENT_AMBULATORY_CARE_PROVIDER_SITE_OTHER): Payer: Self-pay

## 2022-09-20 ENCOUNTER — Encounter (INDEPENDENT_AMBULATORY_CARE_PROVIDER_SITE_OTHER): Payer: Self-pay | Admitting: Family Medicine

## 2022-09-20 LAB — ECHO ADULT TTE COMPLETE
AV Area (Cont Eq VTI): 3.11
AV Area (Cont Eq VTI): 3.402
AV Mean Gradient: 3
AV Mean Gradient: 4
AV Peak Velocity: 1.21
AV Peak Velocity: 1.34
Ao Root Diameter (2D): 3.4
BP Mod LV Ejection Fraction: 59.9
IVS Diastolic Thickness (2D): 1
LA Dimension (2D): 4.3
LA Volume Index (BP A-L): 16
LVID diastole (2D): 5.09
LVID systole (2D): 3.48
MV Area (PHT): 3.993
MV E/A: 0.764
MV E/A: 0.8
MV E/e' (Average): 8.397
MV Mean Gradient: 1
Mitral Valve Findings: NORMAL
Prox Ascending Aorta Diameter: 2.9
Pulmonary Valve Findings: NORMAL
RV Basal Diastolic Dimension: 3.61
RV Function: NORMAL
Site RA Size (AS): NORMAL
Site RV Size (AS): NORMAL
TAPSE: 2.65
Tricuspid Valve Findings: NORMAL

## 2022-09-20 NOTE — Telephone Encounter (Signed)
Pelvic U/S order and Referral to Gynecology (Dr. Alean Rinne) sent to Pt via MyChart.     Vivia Ewing, RN

## 2022-09-21 ENCOUNTER — Encounter (INDEPENDENT_AMBULATORY_CARE_PROVIDER_SITE_OTHER): Payer: Self-pay

## 2022-09-21 ENCOUNTER — Encounter (INDEPENDENT_AMBULATORY_CARE_PROVIDER_SITE_OTHER): Payer: Self-pay | Admitting: Family Medicine

## 2022-09-23 ENCOUNTER — Institutional Professional Consult (permissible substitution) (INDEPENDENT_AMBULATORY_CARE_PROVIDER_SITE_OTHER): Payer: BLUE CROSS/BLUE SHIELD | Admitting: Obstetrics and Gynecology

## 2022-09-23 ENCOUNTER — Encounter (INDEPENDENT_AMBULATORY_CARE_PROVIDER_SITE_OTHER): Payer: Self-pay | Admitting: Internal Medicine

## 2022-09-23 ENCOUNTER — Telehealth (INDEPENDENT_AMBULATORY_CARE_PROVIDER_SITE_OTHER): Payer: BLUE CROSS/BLUE SHIELD | Admitting: Internal Medicine

## 2022-09-23 DIAGNOSIS — M352 Behcet's disease: Secondary | ICD-10-CM

## 2022-09-23 DIAGNOSIS — Z79899 Other long term (current) drug therapy: Secondary | ICD-10-CM

## 2022-09-23 DIAGNOSIS — M0600 Rheumatoid arthritis without rheumatoid factor, unspecified site: Secondary | ICD-10-CM

## 2022-09-23 DIAGNOSIS — S43432S Superior glenoid labrum lesion of left shoulder, sequela: Secondary | ICD-10-CM

## 2022-09-23 DIAGNOSIS — K120 Recurrent oral aphthae: Secondary | ICD-10-CM

## 2022-09-23 NOTE — Progress Notes (Signed)
RHEUMATOLOGY FOLLOW UP TELEMEDICINE NOTE  Telemedicine Documentation Requirements  Originating site (Patient location): home  Distant site (Provider location): Office  Provider and Title: Dr. Adin Hector  Type of Visit: Video Visit  Verbal Consent has been obtained to conduct a telemedicine visit on this date in order to minimize exposure to COVID-19.      PCP: Virgilio Frees, MD    HPI:    This patient is a 41 y.o. year female with Past Medical History of Prior Cholecystectomy, Wold-Parkinson White, Prior Hysterectomy who returns for follow up of Behcet's HLAB51+, recurrent oral ulcers, pathergy, low grade fevers and seronegative arthritis.  -ablation procedure for arrythmia 06/2022  -She is on Humria weekly for inflammatory arthritis from Behcets  [facility] for recurrent aphthous ulcers  -She is on dapsone from Dermatology, clindamycin at night      Interval History:  Last visit 08/04/22    Since her last visit she was hospitalized for left hip pain/lower pelvic pain.  She saw Ob/gyn-they will repeat pelvic US.  She will follow up with Gyn/Onc-ovarian cysts.    She is following with ortho for hip pain.labral tear.    She is on Humira q 7 days and feels much better in terms of fatigue, joint pain.    Otezla 30 mg BID -mouth sores overall better.      She is taking Otezla 30 mg BID    She is schedule for GI in March 2024.    She has some hair thinning.         Dermatologist started her on dapsone-she has had a few scars that are healing-had injections from her dermatologist.    Derm: Dr. Shellee Milo    Labs Reviewed:  08/29/22  CBC platelets 350  BMP unremarkable  ESR wnl  CRP wnl      Imaging Reports Reviewed:  09/19/22:  ECHO:  Left Ventricle    The left ventricle is normal in size. There is normal left ventricular  geometry. Left ventricular systolic function is normal with an ejection  fraction by Biplane Method of Discs of  60 %.  3D LVEF 58%. Left ventricular  segmental wall motion is normal. Left ventricular  diastolic filling parameters  are consistent with Grade I diastolic dysfunction (impaired relaxation  pattern).  Right Ventricle    The right ventricular cavity size is normal in size. Normal right  ventricular systolic function.    08/27/22  MRI left Hip:  IMPRESSION:      1.Trace left hip joint effusion without significant synovitis. Tear of the  left anterosuperior acetabular labrum. Otherwise unremarkable left hip  joint.     2.Mild to moderate left distal gluteus minimus tendinosis and mild to  moderate left greater trochanteric bursitis with bursal edema. There is  mild left proximal hamstring tendinosis.      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     4.There has been interval enlargement of a left adnexal cyst since pelvic  ultrasound 03/18/2022, now measuring 3.3 cm. This could be further  evaluated with follow-up pelvic ultrasound.     5.Unchanged 3.8 cm cyst adjacent to the vaginal cuff post hysterectomy  since multiple prior exams favoring a benign etiology.      Previous Labs:  07/25/22  ESR 37  CRP wnl  CMP ALt 39  CBC wnl      Imaging Reports Reviewed:  04/12/22  Cardiac MRI:  IMPRESSION:       1. No delayed gadolinium  enhancement to suggest myocardial fibrosis or  scarring.  2. Normal right ventricular systolic function.  3. Qualitatively mild global left ventricular hypokinesis. Left ventricular  systolic function measures 32% and may be artifactually reduced secondary  to significant motion artifact.    Labs Reviewed:  04/19/22  Throat culture no B-hemolytic strep  POCT flu A/B  Abbot -      Previous Labs:  01/14/22  CMP wnl  CRP wnl  ViT D 31  ESR wnl  Lipid panel wnl  TSH wnl      08/18/21  CBC wnl  CMP wnl  ESR wnl  CRP wnl    Previous Labs:  05/25/21  Quant Gold-  HepbSAb wnl  HepbSAg-  HepCAb-  HIV-        Previous Labs:  02/23/21  ESR wnl  CRP wnl  CMP ALT 42, AST normal    CRP wnl  ESR wnl  HLAB51 positive  IgE, IgA, IGG, IgM all wnl  CBC wnl    12/03/20  DIAGNOSIS:           A. Lesion, ulnar wrist, excision:     -Fibroadipose tissue and vasculature          B. Synovial tissue, biopsy:     -Fibroadipose tissue and synovium (see comment)          Comment:     Morphologic features typical of rheumatoid arthritis or infection are     not identified.    12/03/20  Bacterial Cultures: No Growth  Fungal Cultures: NG at one week  AFB: No growth    11/20/20  ESR 50  CRP wnl  IgG subclasses wnl  ANA comprehensive panel negative        Previous Labs:    [facility] Labs:  10/23/20  C-ANCA, P-ANCA, MPO, PR3 all negative  Mitochondrial Ab-  ANA IFA-  Scl70-  Centromere-  RNP-  SMith-  TPPA Ab-      05/18/20  Ferritin 291  CMP AST/A:T 59/70  CBC elevated lymphocytes    02/06/20  MPO-  PR3-  SSA-  aCL IgM 14  aCL IgG, IgA -  Lupus anticoagulant -  B2IgG, IgM -  ACE wnl  CCP-  RF-  Vit D 31  TSH wnl  ESR wnl  CK wnl  C4 wnl  C3 wnl  CRP wnl    07/2019  ANA Screen-  LDH wnl    07/23/2018 lymph node biopsy:DIAGNOSIS:          Lymph node, deep right neck, excisional biopsy:     -Lymph node with non-specific reactive changes   Wound, AFB and Fungal cultures negative at this time    BONE MARROW 05/03/2019      BONE MARROW, LEFT POSTERIOR ILIAC CREST, CORE BIOPSY, CLOT SECTION,    ASPIRATE SMEAR AND TOUCH PREP:        - NORMOCELLULAR BONE MARROW (approximate50-60%) WITH TRILINEAGE HEMATOPOIESIS,  NORMAL MYELOID TO ERYTHROID RATIO AND ADEQUATE NUMBERS OF    MEGAKARYOCYTES WIT H UNREMARKABLE MORPHOLOGY        - A FEW SMALL REACTIVE LYMPHOID AGGREGATES IDENTIFIED, AND MILDLY  INCREASED NUMBERS OF EOSINOPHILS        - PENDING CYTOGENETIC STUDIES OF BONE MARROW SAMPLE  - Remain pending as of 05/14/2019.        COMMENT:    The concurrent flow cytometry performed on the bone marrow aspirate  sample shows no evidence of immunophenotypic abnormalities      Imaging  Reviewed:    08/16/20  MRI Brain:IMPRESSION:      1. No intracranial mass, hemorrhage, or hydrocephalus is detected.  2. There is normal pattern of enhancement of  the brain parenchyma and  meningeal surfaces.    9/18 MRI Abdomen:  IMPRESSION:      1. Diffuse hepatic steatosis. No suspicious focal hepatic lesions.  2. No biliary ductal dilatation or choledocholithiasis.    02/03/20  MRI Pelvis:  IMPRESSION:         Cystic structure at the vaginal cuff, flank by both ovaries not  significantly changed in size compared to 2019. No abnormal internal  enhancement. This favors a benign process such as a peritoneal inclusion  cyst or a paraovarian cyst.    12/14/19 MRI R Hand:  IMPRESSION:      1.  No suspicious soft tissue mass, osseous mass, or masslike  enhancement in the right hand or wrist to correlate with the diffuse FDG  uptake on recent PET/CT.  2.  There is mild enhancing synovitis in the right wrist joint without  active joint erosion or bony destruction.  3.  A small dorsal ganglion cyst overlying the scapholunate interval.    12/06/19  Full Body PET Scan:    Hypermetabolic soft tissue mass in palmar R hand surface at base of the second digit-stable pulmonary nodulke 1.3 cm left lower lobe-recommend follow up Chest CT 3 months         The following sections were reviewed this encounter by the provider:   Tobacco  Allergies  Meds  Problems  Med Hx  Surg Hx  Fam Hx         PMH/PSH:  Past Medical History:   Diagnosis Date    Abnormal Pap smear of cervix     Behcet's disease     She sees Dr. Adin Hector    BMI 45.0-49.9, adult     Diarrhea     occasional // poss. r/t meds    Headache     on Rx    Lower back pain     Meningitis 2011    Viral // treated and resolved    Neck pain     PT// Improved    Post-operative nausea and vomiting     PVC (premature ventricular contraction)     Scoliosis     moderate// used a Brace at a young age        Past Surgical History:   Procedure Laterality Date    ABLATION - PVC'S N/A 06/08/2022    Procedure: Ablation - PVC's;  Surgeon: [name], MD;  Location: [facility];  Service: Cardiovascular;  Laterality: N/A;  SAME DAY DISCHARGE  CARTO NO TEE     ARTHROTOMY, WRIST Right 12/03/2020    Procedure: RIGHT UPPER EXTREMITY SYNOVIAL BIOPSY;  Surgeon: Kellie Simmering, MD;  Location: [facility];  Service: Plastics;  Laterality: Right;    BIOPSY, LYMPH NODE N/A 07/23/2018    Procedure: BIOPSY, LYMPH NODE;  Surgeon: Shelda Pal, MD;  Location: [facility];  Service: ENT;  Laterality: N/A;  NECK DEEP LYMPH NODE BIOPSY    BONE MARROW BIOPSY  2020    CARDIAC ABLATION  2005    [facility] for Wolff-Parkinson-White    CHOLECYSTECTOMY  2009    D&C DIAGNOSTIC  2010    DILATION AND CURETTAGE OF UTERUS  2010    FINGER GANGLION CYST EXCISION  04/2018    GANGLION CYST EXCISION  2014  04/2018 cyst removed from index finger-[facility]    HERNIA REPAIR  2016    HYSTERECTOMY  2012    PELVIC LAPAROSCOPY  2011    UMBILICAL HERNIA REPAIR  2016    UPPER GASTROINTESTINAL ENDOSCOPY  2009    WISDOM TOOTH EXTRACTION  1998        FH/SH:  Family History   Adopted: Yes       Social History     Tobacco Use    Smoking status: Never    Smokeless tobacco: Never   Vaping Use    Vaping Use: Never used   Substance Use Topics    Alcohol use: Not Currently     Alcohol/week: 0.0 standard drinks of alcohol     Comment: rarely    Drug use: Never        Meds/ Allergies:  No outpatient medications have been marked as taking for the 09/23/22 encounter (Telemedicine Visit) with Baltazar NajjarMcBride, Amear Strojny M, MD.     Allergies   Allergen Reactions    Fruit Blend Flavor [Flavoring Agent] Anaphylaxis and Swelling    Latex Other (See Comments), Hives and Swelling     Swelling and redness throughout body   Also severe allergy to surgical glue    Adhesive [Wound Dressing Adhesive] Itching     dermabond    Other Itching and Rash     Sutures Monocryl // and Tropical fruits like coconuts, pineapple, kiwi, etc.    Oxycodone Hallucinations          PHYSICAL EXAM  General- WNWD, alert and oriented, NAD  Eyes- EOMI, PERRL, no conjunctival injection, no scleral icterus  ENMT- face  symmetric without lesions  Skin- no rash, no alopecia  Pulm: No conversational dyspnea, breathing comfortable on room air      .        LABS:  Lab Results   Component Value Date    WBC 8.28 08/29/2022    HGB 12.2 08/29/2022    HCT 37.6 08/29/2022    MCV 94.2 08/29/2022    PLT 350 (H) 08/29/2022      Lab Results   Component Value Date    CREAT 0.7 08/29/2022    BUN 10.0 08/29/2022    NA 137 08/29/2022    K 3.8 08/29/2022    CL 104 08/29/2022    CO2 20 08/29/2022      Lab Results   Component Value Date    ALT 45 08/27/2022    AST 27 08/27/2022    ALKPHOS 65 08/27/2022    BILITOTAL 0.3 08/27/2022     Lab Results   Component Value Date    ESR 18 08/27/2022    ESR 37 (H) 07/25/2022    ESR 15 01/14/2022    ESR 6 02/23/2021    ESR 7 01/25/2021      Lab Results   Component Value Date    CRP 0.1 08/27/2022    CRP 1 07/25/2022    CRP 1 01/14/2022    CRP <1 02/23/2021    CRP 2 01/25/2021      No results found for: "ANA"   Lab Results   Component Value Date    RHEUMFACTOR <15.0 02/06/2020      Lab Results   Component Value Date    CCP <15.6 02/06/2020     No results found for: "URICACID"        ASSESSMENT/PLAN:    This patient is a 41 y.o. year female with Past Medical  History of Prior Cholecystectomy, Wolf-Parkinson White, Prior Hysterectomy, Fatty Liver Disease who returns for follow up of Behcet's Disease and seronegative rheumatoid arthritis.    Will continue Otezla 30 mg BID, and Humira 40 mg every seven days.  She needs more frequent dosing as she would have recurrent fevers, joint pain and amouth sores prior to her next dose when every 10-14 days.    No synovitis or signs of inflamamtory arthritis on MRI Hips during hospitalization.    Following with O/Gyn for ovarian cysts-has repeat pelvic US scheduled.    Will follow up with ortho regarding labral tear and if any treatment, PT indicated.    S/P ablation for cardiac arrythmia.    Following with Neurology for migraines.    She will see GI for diarrhea, abdominal pain,  nausea.      1. Behcet's disease    2. Aphthous ulcer    3. Seronegative rheumatoid arthritis    4. High risk medication use    5. Glenoid labral tear, left, sequela                     Total Time spent reviewing records, interviewing patient and coordinating plan of care: 26 min    FOLLOW UP:  Return for She has a visit in 2 months-January 2024.                                                                                  Debe Coder, MD  Rheumatologist  [facility]  [address]  [place], [place] [address]  Phone: [phone]  Fax: [phone]

## 2022-09-23 NOTE — Patient Instructions (Addendum)
Dear Hailey [name] ,     Thanks for arranging a video visit with me.    You may retrieve any orders from this visit in Mychart by using these steps:    After signing into Mychart  1) click Messaging  2) click Letters  3) select the Requisition  4) Review and click print    Please contact the clinic at [phone] with any questions and to schedule your follow up visit.    Sincerely,    Debe Coder, MD  Rheumatologist  [facility]  [address]  [place], [place] [address]  Phone: [phone]  Fax: [phone]

## 2022-09-26 ENCOUNTER — Encounter (INDEPENDENT_AMBULATORY_CARE_PROVIDER_SITE_OTHER): Payer: Self-pay

## 2022-09-28 ENCOUNTER — Other Ambulatory Visit (INDEPENDENT_AMBULATORY_CARE_PROVIDER_SITE_OTHER): Payer: Self-pay | Admitting: Obstetrics and Gynecology

## 2022-09-28 ENCOUNTER — Encounter (INDEPENDENT_AMBULATORY_CARE_PROVIDER_SITE_OTHER): Payer: Self-pay | Admitting: Family Medicine

## 2022-10-03 ENCOUNTER — Encounter (INDEPENDENT_AMBULATORY_CARE_PROVIDER_SITE_OTHER): Payer: Self-pay | Admitting: Obstetrics and Gynecology

## 2022-10-03 ENCOUNTER — Ambulatory Visit (INDEPENDENT_AMBULATORY_CARE_PROVIDER_SITE_OTHER): Payer: BLUE CROSS/BLUE SHIELD | Admitting: Obstetrics and Gynecology

## 2022-10-03 ENCOUNTER — Encounter (INDEPENDENT_AMBULATORY_CARE_PROVIDER_SITE_OTHER): Payer: Self-pay | Admitting: Sports Medicine

## 2022-10-03 VITALS — BP 170/89 | HR 98 | Temp 98.9°F | Ht 68.0 in | Wt 315.0 lb

## 2022-10-03 DIAGNOSIS — M6289 Other specified disorders of muscle: Secondary | ICD-10-CM

## 2022-10-03 DIAGNOSIS — M352 Behcet's disease: Secondary | ICD-10-CM

## 2022-10-03 DIAGNOSIS — Z90710 Acquired absence of both cervix and uterus: Secondary | ICD-10-CM

## 2022-10-03 DIAGNOSIS — N898 Other specified noninflammatory disorders of vagina: Secondary | ICD-10-CM

## 2022-10-03 DIAGNOSIS — Z9889 Other specified postprocedural states: Secondary | ICD-10-CM

## 2022-10-03 DIAGNOSIS — M069 Rheumatoid arthritis, unspecified: Secondary | ICD-10-CM

## 2022-10-03 DIAGNOSIS — R1032 Left lower quadrant pain: Secondary | ICD-10-CM

## 2022-10-03 DIAGNOSIS — Z8742 Personal history of other diseases of the female genital tract: Secondary | ICD-10-CM

## 2022-10-03 NOTE — Progress Notes (Signed)
MIGS CONSULTATION       Patient Name: Hailey [name]  Attending Physician: No att. providers found    CC: Gynecologic Exam (NP: Ref by Dr. Reita May for ovarian cysts and vaginal cuff cyst. Imaging: TVUS ordered- not completed in epic. Symptoms: LLQ pain)      History of Presenting Illness:   Hailey [name] is a 41 y.o. G0 who presents to the office with new onset of LLQ. History of  Bechet's and RA. She had a severe pain episode requiring hospital admission on the left side. She was admitted for abx and suspected infected joint. On MRI a 3 cm left ovarian cyst and known vaginal cuff cyst was found. 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 Follow up US showed normal ovaries and 4.4 cm cyst of the vaginal cuff, question if it has grown slightly from 3.5 cm, or difference in modality reflects this change. Dr. Reita May requested consultation. Patient reports stabbing LLQ pain that increases when she lies down or exacerbated by certain movements. The pain since hospitalization has decreased but still noticeable.     S/p ortho consultation who felt it wasn't her hip.     Reports longstanding dyspareunia - sharp pain after intercourse in the pelvis.  Reports some increased urinary frequency.  Has some nausea.   Does not notice any change in stool from baseline. Denies vomiting.     S/p hysterectomy in 2011 for abnormal bleeding.   Remote history of abnormal pap smear. Vaginal paps have been normal. Last pap in 2021.    History significant for lap chole, hernia repair, hysterectomy, cardiac ablation (last echo wnl)  Past Medical History:     Past Medical History:   Diagnosis Date    Abnormal Pap smear of cervix     Behcet's disease     She sees Dr. Adin Hector    BMI 45.0-49.9, adult     Diarrhea     occasional // poss. r/t meds    Headache     on Rx    Lower back pain     Meningitis 2011    Viral // treated and resolved    Neck pain     PT// Improved    Post-operative  nausea and vomiting     PVC (premature ventricular contraction)     Scoliosis     moderate// used a Brace at a young age       Past Surgical History:     Past Surgical History:   Procedure Laterality Date    ABLATION - PVC'S N/A 06/08/2022    Procedure: Ablation - PVC's;  Surgeon: [name], MD;  Location: [facility];  Service: Cardiovascular;  Laterality: N/A;  SAME DAY DISCHARGE  CARTO NO TEE    ARTHROTOMY, WRIST Right 12/03/2020    Procedure: RIGHT UPPER EXTREMITY SYNOVIAL BIOPSY;  Surgeon: Kellie Simmering, MD;  Location: [facility];  Service: Plastics;  Laterality: Right;    BIOPSY, LYMPH NODE N/A 07/23/2018    Procedure: BIOPSY, LYMPH NODE;  Surgeon: Shelda Pal, MD;  Location: [facility];  Service: ENT;  Laterality: N/A;  NECK DEEP LYMPH NODE BIOPSY    BONE MARROW BIOPSY  2020    CARDIAC ABLATION  2005    [facility] for Wolff-Parkinson-White    CHOLECYSTECTOMY  2009    D&C DIAGNOSTIC  2010    DILATION AND CURETTAGE OF UTERUS  2010    FINGER GANGLION  CYST EXCISION  04/2018    GANGLION CYST EXCISION  2014    04/2018 cyst removed from index finger-[facility]    HERNIA REPAIR  2016    HYSTERECTOMY  2012    PELVIC LAPAROSCOPY  2011    UMBILICAL HERNIA REPAIR  2016    UPPER GASTROINTESTINAL ENDOSCOPY  2009    WISDOM TOOTH EXTRACTION  1998       OB History:     No LMP recorded (lmp unknown). Patient has had a hysterectomy.  OB History       Gravida   1    Para        Term        Preterm        AB   1    Living             SAB   1    IAB        Ectopic        Multiple        Live Births                     Medications        Current Outpatient Medications:     Adalimumab 40 MG/0.4ML Pen-injector Kit, Inject 0.4 mLs (40 mg) into the skin once a week, Disp: 8 each, Rfl: 3    Apremilast (Otezla) 30 MG Tab, Take 1 tablet (30 mg) by mouth 2 (two) times daily (Patient taking differently: Take 1 tablet (30 mg) by mouth 2 (two) times daily), Disp: 180 tablet, Rfl: 1     Azelastine-Fluticasone 137-50 MCG/ACT Suspension, 1 spray by Each Nare route 2 (two) times daily as needed, Disp: , Rfl:     botulinum Toxin Type A (Botox) 200 units Recon Soln injection, Inject 155 units IM every 12 weeks, Disp: 1 each, Rfl: 3    busPIRone (BUSPAR) 5 MG tablet, TAKE 1 TABLET (5 MG) BY MOUTH 3 (THREE) TIMES DAILY AS NEEDED (ANXIETY), Disp: 270 tablet, Rfl: 0    clindamycin (CLEOCIN T) 1 % lotion, Apply topically every morning, Disp: , Rfl:     colesevelam (WELCHOL) 625 MG tablet, Take 1 tablet (625 mg) by mouth 2 (two) times daily with meals (Patient taking differently: Take 2 tablets (1,250 mg) by mouth every morning), Disp: 180 tablet, Rfl: 3    cyclobenzaprine (FLEXERIL) 5 MG tablet, TAKE 1 TABLET FOR MUSCLE SPASMS AT NIGHT AS NEEDED, Disp: 30 tablet, Rfl: 0    Dapsone 7.5 % Gel, Apply topically every morning, Disp: , Rfl:     EPINEPHrine (EPIPEN 2-PAK) 0.3 MG/0.3ML Solution Auto-injector injection, Inject 0.3 mLs (0.3 mg) into the muscle once as needed (Anaphylaxis) ; go to ER after injection, Disp: 1 each, Rfl: 1    famotidine (PEPCID) 20 MG tablet, Take 1 tablet (20 mg) by mouth 2 (two) times daily as needed for Heartburn (with advil), Disp: 20 tablet, Rfl: 0    fexofenadine (Allegra Allergy) 180 MG tablet, Take 1 tablet (180 mg) by mouth daily, Disp: , Rfl:     hydrOXYzine (ATARAX) 10 MG tablet, Take 1 tablet (10 mg) by mouth nightly, Disp: 90 tablet, Rfl: 3    ibuprofen (ADVIL) 600 MG tablet, Take 1 tablet (600 mg) by mouth every 8 (eight) hours as needed for Pain, Disp: 20 tablet, Rfl: 0    Jublia 10 % Solution, , Disp: , Rfl:     lidocaine-prilocaine (EMLA) cream, Apply topically as  needed Dermatology procedures, Disp: , Rfl:     Multiple Vitamins-Minerals (MULTIVITAMIN ADULT PO), Take by mouth nightly, Disp: , Rfl:     Omega-3 1000 MG Cap, Take by mouth nightly, Disp: , Rfl:     ondansetron (ZOFRAN-ODT) 4 MG disintegrating tablet, TAKE 1 TABLET (4 MG) BY MOUTH EVERY 8 (EIGHT) HOURS AS  NEEDED FOR NAUSEA, Disp: 30 tablet, Rfl: 2    Rimegepant Sulfate 75 MG Tablet Dispersible, Take 1 tablet (75 mg) by mouth every other day For migraine prevention, Disp: 16 tablet, Rfl: 11    rizatriptan (MAXALT) 10 MG tablet, Take 1-2 tablets (10-20 mg total) by mouth once as needed for Migraine Max 3 days a week, Disp: 36 tablet, Rfl: 3    valACYclovir (VALTREX) 1000 MG tablet, Take 2 tabs at onset; repeat once in 12 hours.  Total 4 tabs per outbreak, Disp: 12 tablet, Rfl: 3    vitamin D (CHOLECALCIFEROL) 25 MCG (1000 UT) tablet, Take 1 tablet (1,000 Units) by mouth nightly, Disp: , Rfl:       Allergies:     Allergies   Allergen Reactions    Fruit Blend Flavor [Flavoring Agent] Anaphylaxis and Swelling    Latex Other (See Comments), Hives and Swelling     Swelling and redness throughout body   Also severe allergy to surgical glue    Adhesive [Wound Dressing Adhesive] Itching     dermabond    Other Itching and Rash     Sutures Monocryl // and Tropical fruits like coconuts, pineapple, kiwi, etc.    Oxycodone Hallucinations       Psychosocial / Family History:       Family History   Adopted: Yes       Review of Systems:     Per hpi    Physical Exam:   BP 170/89   Pulse 98   Temp 98.9 F (37.2 C)   Ht 1.727 m (5\' 8" )   Wt 142.9 kg (315 lb)   LMP  (LMP Unknown)   BMI 47.90 kg/m     Gen: NAD, AAOx3  Abdomen: Soft, ND, Left lower quadrant tenderness  External Genitalia: no lesions  Vagina: Patient has tenderness all along vaginal cuff and mid pelvis to left side  Levator ani tenderness with mild contracture  Cervix: surgically absent  Uterus: surgically absent      ULTRASOUND PELVIC COMPLETE AND TRANSVAGINAL     HISTORY: Left lower quadrant pain.      COMPARISON: Pelvic ultrasound 03/18/2022.     TECHNIQUE: Both transabdominal and endovaginal imaging was performed.  Transabdominal examination of the pelvis was performed via partially  distended urinary bladder.     FINDINGS:     Measurements:  Uterus: Surgically  absent   Right ovary: 2.5 x 2.8 x 2.8 cm  Left ovary: 3.7 x 2.8 x 2.7 cm     Status post hysterectomy. A simple appearing cyst at the vaginal cuff is  redemonstrated measuring 4.4 x 3.5 x 3.9 cm, previously 3.8 x 2.9 x 3.8 cm.     Normal ovarian texture. No suspicious adnexal mass identified.     No free fluid.     IMPRESSION:         1. Status post hysterectomy. Simple 4.4 cm cyst at the vaginal cuff,  increased.      MRI FINDINGS:  Bone: The bone and bone marrow are normal. There is no evidence of fracture  or osteonecrosis. No abnormal offset is seen at  the femoral head-neck  junction. The acetabular depth is normal.     Cartilage/Labrum: The articular cartilage is normal. No focal defects are  identified. There is a tear of the left anterosuperior acetabular labrum.  No para-labral cyst.     Joint: Trace left hip joint effusion without significantly enhancing  synovitis.. No loose bodies identified.     Muscles/Tendons: Mild to moderate left distal gluteus minimus tendinosis  and mild to moderate greater trochanteric bursitis including bursal edema.  There is mild left proximal hamstring tendinosis. Large field-of-view  images show mild right greater trochanteric bursitis.      Other: Large field-of-view axial images show moderate left and mild right  SI joint degenerative changes with anterior osteophytes, stable since  02/23/2021. Partial imaging of lower lumbar spine arthrosis.     Other: There is a 3.8 cm cyst adjacent to the vaginal cuff post  hysterectomy, not significant changed from prior exam stating to a pelvis  MRI from 02/03/2020. There has been interval enlargement of a left adnexal  cyst since pelvic ultrasound 03/18/2022, now measuring 3.3 cm.     IMPRESSION:      1.Trace left hip joint effusion without significant synovitis. Tear of the  left anterosuperior acetabular labrum. Otherwise unremarkable left hip  joint.     2.Mild to moderate left distal gluteus minimus tendinosis and mild  to  moderate left greater trochanteric bursitis with bursal edema. There is  mild left proximal hamstring tendinosis.      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     4.There has been interval enlargement of a left adnexal cyst since pelvic  ultrasound 03/18/2022, now measuring 3.3 cm. This could be further  evaluated with follow-up pelvic ultrasound.     5.Unchanged 3.8 cm cyst adjacent to the vaginal cuff post hysterectomy  since multiple prior exams favoring a benign etiology.  Assessment:   41 y/o with new onset of llq pain of uncertain etiology, with history of dyspareunia and known inclusion cyst after hysterectomy which is overall stable.     Plan:     Given the patient's multiple abdominal surgeries, I have recommended that she proceed with steroid injections first which is less invasive to see if there is any resolution of her pain since she has a known MSK disease which could be the source of her pain, especially with recent hospital admission in which her pain was responsive to antibiotics.     The pain distribution is a bit unusual for a vaginal cuff cyst, but could be a referred pain or adhesive disease from previous multiple abdominal surgeries. The cyst has overall remained stable in size with new onset of pain, which means that it is difficult to tell if this is truly causing her pain, but remains on the differential. I feel GI is less likely cause given exacerbation with certain movements and no changes in bowel movements.     She has pelvic floor dysfunction on exam which also seems to be a chronic issue. I have recommended pelvic floor PT in the future to help with dyspareunia which I encourage for at least 6 months to see results.     If the steroids injections show no improvement in her symptoms, then I would recommend a diagnostic laparoscopy with removal of the cyst and possible lysis of adhesions.     It was a pleasure taking care of Ms. Zufall at today's visit  and I look forward to continuing her  care. I have asked her to reach out to me if she has any worsening of pain to expedite surgery if indicated.     I spent over 45 minutes with the patient and over 50 percent of the time counseling the patient.     I reviewed all images.     Signed by: Devota Pace, MD

## 2022-10-10 ENCOUNTER — Telehealth (INDEPENDENT_AMBULATORY_CARE_PROVIDER_SITE_OTHER): Payer: Self-pay

## 2022-10-10 DIAGNOSIS — R1032 Left lower quadrant pain: Secondary | ICD-10-CM

## 2022-10-10 NOTE — Telephone Encounter (Signed)
Called patient.   Discussed increasing in pain in the LLQ exacerbated by bowel movement.   Last night had fever 101.5.       Reviewed [place] and MRI last year, less likely for benign simple cyst to cause these symptoms. Will rule out GI or GU tract etiology.    ED precautions given  Plan for stat labs and CT.

## 2022-10-10 NOTE — Addendum Note (Signed)
Addended by: Westley Hummer on: 10/10/2022 11:16 AM     Modules accepted: Orders

## 2022-10-10 NOTE — Telephone Encounter (Addendum)
Pt called office with an increase in pain since her LV on 10/03/2022. Pt stated that pain in left lower flank area has increased. Previously stated that it was a stabbing pain but has new onset over the weekend of cramping- which increases primarily with BM and sometime with urination. Pt stated that she has tried ibuprofen and has had no relief. Plan was to start tx with injections in the hip but was unable to get scheduled for another couple of weeks. Told pt to continue with ibuprofen as needed and to use heating pad to help relieve pain. Pt verbalized understanding. Routed to MD for review.

## 2022-10-11 ENCOUNTER — Ambulatory Visit
Admission: RE | Admit: 2022-10-11 | Discharge: 2022-10-11 | Disposition: A | Discharge status: Home / Self Care | Payer: BLUE CROSS/BLUE SHIELD | Source: Ambulatory Visit | Attending: Obstetrics and Gynecology | Admitting: Obstetrics and Gynecology

## 2022-10-11 ENCOUNTER — Other Ambulatory Visit: Payer: BLUE CROSS/BLUE SHIELD

## 2022-10-11 DIAGNOSIS — R1032 Left lower quadrant pain: Secondary | ICD-10-CM

## 2022-10-11 LAB — COMPREHENSIVE METABOLIC PANEL
ALT: 52 U/L (ref 0–55)
AST (SGOT): 37 U/L (ref 5–41)
Albumin/Globulin Ratio: 1.1 (ref 0.9–2.2)
Albumin: 4.3 g/dL (ref 3.5–5.0)
Alkaline Phosphatase: 65 U/L (ref 37–117)
Anion Gap: 9 (ref 5.0–15.0)
BUN: 11 mg/dL (ref 7.0–21.0)
Bilirubin, Total: 0.5 mg/dL (ref 0.2–1.2)
CO2: 22 mEq/L (ref 17–29)
Calcium: 10.2 mg/dL (ref 8.5–10.5)
Chloride: 107 mEq/L (ref 99–111)
Creatinine: 0.8 mg/dL (ref 0.4–1.0)
Globulin: 4 g/dL — ABNORMAL HIGH (ref 2.0–3.6)
Glucose: 95 mg/dL (ref 70–100)
Potassium: 4.4 mEq/L (ref 3.5–5.3)
Protein, Total: 8.3 g/dL (ref 6.0–8.3)
Sodium: 138 mEq/L (ref 135–145)
eGFR: >60 mL/min/{1.73_m2} (ref >=60)

## 2022-10-11 LAB — CBC AND DIFFERENTIAL
Absolute NRBC: 0 10*3/uL (ref 0.00–0.00)
Basophils Absolute Automated: 0.04 10*3/uL (ref 0.00–0.08)
Basophils Automated: 0.3 %
Eosinophils Absolute Automated: 0.15 10*3/uL (ref 0.00–0.44)
Eosinophils Automated: 1.2 %
Hematocrit: 42.2 % (ref 34.7–43.7)
Hgb: 14.1 g/dL (ref 11.4–14.8)
Immature Granulocytes Absolute: 0.04 10*3/uL (ref 0.00–0.07)
Immature Granulocytes: 0.3 %
Instrument Absolute Neutrophil Count: 6.89 10*3/uL — ABNORMAL HIGH (ref 1.10–6.33)
Lymphocytes Absolute Automated: 4.2 10*3/uL — ABNORMAL HIGH (ref 0.42–3.22)
Lymphocytes Automated: 34.7 %
MCH: 30.7 pg (ref 25.1–33.5)
MCHC: 33.4 g/dL (ref 31.5–35.8)
MCV: 91.9 fL (ref 78.0–96.0)
MPV: 8.3 fL — ABNORMAL LOW (ref 8.9–12.5)
Monocytes Absolute Automated: 0.78 10*3/uL (ref 0.21–0.85)
Monocytes: 6.4 %
Neutrophils Absolute: 6.89 10*3/uL — ABNORMAL HIGH (ref 1.10–6.33)
Neutrophils: 57.1 %
Nucleated RBC: 0 /100 WBC (ref 0.0–0.0)
Platelets: 386 10*3/uL — ABNORMAL HIGH (ref 142–346)
RBC: 4.59 10*6/uL (ref 3.90–5.10)
RDW: 13 % (ref 11–15)
WBC: 12.1 10*3/uL — ABNORMAL HIGH (ref 3.10–9.50)

## 2022-10-11 LAB — HEMOLYSIS INDEX: Hemolysis Index: 7 Index (ref 0–24)

## 2022-10-11 MED ORDER — IOHEXOL 350 MG/ML IV SOLN
100.0000 mL | Freq: Once | INTRAVENOUS | Status: AC | PRN
Start: 2022-10-11 — End: 2022-10-11
  Administered 2022-10-11: 100 mL via INTRAVENOUS

## 2022-10-12 ENCOUNTER — Telehealth (INDEPENDENT_AMBULATORY_CARE_PROVIDER_SITE_OTHER): Payer: BLUE CROSS/BLUE SHIELD | Admitting: Obstetrics and Gynecology

## 2022-10-12 DIAGNOSIS — N898 Other specified noninflammatory disorders of vagina: Secondary | ICD-10-CM

## 2022-10-12 DIAGNOSIS — R1032 Left lower quadrant pain: Secondary | ICD-10-CM

## 2022-10-12 NOTE — Progress Notes (Signed)
MIGS CONSULTATION       Patient Name: Hailey [name], Hailey [name]  Attending Physician: No att. providers found    CC: No chief complaint on file.        History of Presenting Illness:   Hailey [name] is a 41 y.o. female who presents to the office with acute worsening of LLQ pain. See previous notes for full details.     Here to follow up imaging and test results. Still having continued pain.     Past Medical History:     Past Medical History:   Diagnosis Date    Abnormal Pap smear of cervix     Behcet's disease     She sees Dr. Adin Hector    BMI 45.0-49.9, adult     Diarrhea     occasional // poss. r/t meds    Headache     on Rx    Hx of ovarian cyst     Lower back pain     Meningitis 2011    Viral // treated and resolved    Neck pain     PT// Improved    Post-operative nausea and vomiting     PVC (premature ventricular contraction)     Scoliosis     moderate// used a Brace at a young age       Past Surgical History:     Past Surgical History:   Procedure Laterality Date    ABLATION - PVC'S N/A 06/08/2022    Procedure: Ablation - PVC's;  Surgeon: [name], MD;  Location: [facility];  Service: Cardiovascular;  Laterality: N/A;  SAME DAY DISCHARGE  CARTO NO TEE    ARTHROTOMY, WRIST Right 12/03/2020    Procedure: RIGHT UPPER EXTREMITY SYNOVIAL BIOPSY;  Surgeon: Kellie Simmering, MD;  Location: [facility];  Service: Plastics;  Laterality: Right;    BIOPSY, LYMPH NODE N/A 07/23/2018    Procedure: BIOPSY, LYMPH NODE;  Surgeon: Shelda Pal, MD;  Location: [facility];  Service: ENT;  Laterality: N/A;  NECK DEEP LYMPH NODE BIOPSY    BONE MARROW BIOPSY  2020    CARDIAC ABLATION  2005    [facility] for Wolff-Parkinson-White    CHOLECYSTECTOMY  2009    D&C DIAGNOSTIC  2010    DILATION AND CURETTAGE OF UTERUS  2010    FINGER GANGLION CYST EXCISION  04/2018    GANGLION CYST EXCISION  2014    04/2018 cyst removed from index finger-[facility]    HERNIA REPAIR  2016     HYSTERECTOMY  2012    HYSTEROSCOPY  2011    indication: Abnormal bleeding    UMBILICAL HERNIA REPAIR  2016    UPPER GASTROINTESTINAL ENDOSCOPY  2009    WISDOM TOOTH EXTRACTION  1998       OB History:     No LMP recorded (lmp unknown). Patient has had a hysterectomy.  OB History       Gravida   1    Para        Term        Preterm        AB   1    Living             SAB   1    IAB        Ectopic        Multiple        Live Births  Medications      Current Outpatient Medications:     Adalimumab 40 MG/0.4ML Pen-injector Kit, Inject 0.4 mLs (40 mg) into the skin once a week, Disp: 8 each, Rfl: 3    Apremilast (Otezla) 30 MG Tab, Take 1 tablet (30 mg) by mouth 2 (two) times daily (Patient taking differently: Take 1 tablet (30 mg) by mouth 2 (two) times daily), Disp: 180 tablet, Rfl: 1    Azelastine-Fluticasone 137-50 MCG/ACT Suspension, 1 spray by Each Nare route 2 (two) times daily as needed, Disp: , Rfl:     botulinum Toxin Type A (Botox) 200 units Recon Soln injection, Inject 155 units IM every 12 weeks, Disp: 1 each, Rfl: 3    busPIRone (BUSPAR) 5 MG tablet, TAKE 1 TABLET (5 MG) BY MOUTH 3 (THREE) TIMES DAILY AS NEEDED (ANXIETY), Disp: 270 tablet, Rfl: 0    clindamycin (CLEOCIN T) 1 % lotion, Apply topically every morning, Disp: , Rfl:     colesevelam (WELCHOL) 625 MG tablet, Take 1 tablet (625 mg) by mouth 2 (two) times daily with meals (Patient taking differently: Take 2 tablets (1,250 mg) by mouth every morning), Disp: 180 tablet, Rfl: 3    cyclobenzaprine (FLEXERIL) 5 MG tablet, TAKE 1 TABLET FOR MUSCLE SPASMS AT NIGHT AS NEEDED, Disp: 30 tablet, Rfl: 0    Dapsone 7.5 % Gel, Apply topically every morning, Disp: , Rfl:     EPINEPHrine (EPIPEN 2-PAK) 0.3 MG/0.3ML Solution Auto-injector injection, Inject 0.3 mLs (0.3 mg) into the muscle once as needed (Anaphylaxis) ; go to ER after injection, Disp: 1 each, Rfl: 1    famotidine (PEPCID) 20 MG tablet, Take 1 tablet (20 mg) by mouth 2 (two) times  daily as needed for Heartburn (with advil), Disp: 20 tablet, Rfl: 0    fexofenadine (Allegra Allergy) 180 MG tablet, Take 1 tablet (180 mg) by mouth daily, Disp: , Rfl:     hydrOXYzine (ATARAX) 10 MG tablet, Take 1 tablet (10 mg) by mouth nightly, Disp: 90 tablet, Rfl: 3    ibuprofen (ADVIL) 600 MG tablet, Take 1 tablet (600 mg) by mouth every 8 (eight) hours as needed for Pain, Disp: 20 tablet, Rfl: 0    Jublia 10 % Solution, , Disp: , Rfl:     lidocaine-prilocaine (EMLA) cream, Apply topically as needed Dermatology procedures, Disp: , Rfl:     Multiple Vitamins-Minerals (MULTIVITAMIN ADULT PO), Take by mouth nightly, Disp: , Rfl:     Omega-3 1000 MG Cap, Take by mouth nightly, Disp: , Rfl:     ondansetron (ZOFRAN-ODT) 4 MG disintegrating tablet, TAKE 1 TABLET (4 MG) BY MOUTH EVERY 8 (EIGHT) HOURS AS NEEDED FOR NAUSEA, Disp: 30 tablet, Rfl: 2    Rimegepant Sulfate 75 MG Tablet Dispersible, Take 1 tablet (75 mg) by mouth every other day For migraine prevention, Disp: 16 tablet, Rfl: 11    rizatriptan (MAXALT) 10 MG tablet, Take 1-2 tablets (10-20 mg total) by mouth once as needed for Migraine Max 3 days a week, Disp: 36 tablet, Rfl: 3    valACYclovir (VALTREX) 1000 MG tablet, Take 2 tabs at onset; repeat once in 12 hours.  Total 4 tabs per outbreak, Disp: 12 tablet, Rfl: 3    vitamin D (CHOLECALCIFEROL) 25 MCG (1000 UT) tablet, Take 1 tablet (1,000 Units) by mouth nightly, Disp: , Rfl:   No current facility-administered medications for this visit.   Allergies:     Allergies   Allergen Reactions    Fruit Blend Flavor [Flavoring  Agent] Anaphylaxis and Swelling     Tropical Fruit     Latex Other (See Comments), Hives and Swelling     Swelling and redness throughout body   Also severe allergy to surgical glue    Adhesive [Wound Dressing Adhesive] Itching     dermabond    Other Itching and Rash     Sutures Monocryl // and Tropical fruits like coconuts, pineapple, kiwi, etc.    Oxycodone Hallucinations       Psychosocial  / Family History:       Family History   Adopted: Yes       Review of Systems:     Per HPI    Physical Exam:   There were no vitals filed for this visit.    Video visit.  Patient appears uncomfortable, but not in acute distress      CMP, and CT within normal limits.   UA and culture pending.   CBC demonstrates white count of 12      Assessment:   41 y/o with vaginal cyst, possibly attributing to worsening abdominal pain.     Plan:     Diagnostic laparoscopy, removal of vaginal cuff cyst, possible lysis of adhesions    I discussed the risks, benefits, and alternatives to the procedure. Patient would like to proceed with diagnostic lap. We discussed risks of bleeding, infection, injury to surrounding organs including but not limited to bowel, bladder, ureter, vessels/nerves, wound complications/undesired scarring, open procedure, inability to cure her pain, blood transfusion,and in rare cases long-term pain, MI, stroke, blood clots, disability or death.     Multiple previous abdominal surgeries including hernia repair. Patient verbalizes understanding of risks associated with surgery.    I spent 20 minutes of face-to-face time virtually with the patient on history and exam, discussion of prior testing, and counseling/education as in the above assessment documentation, as well as an additional 5 minutes of non-face-to-face time on the same day as the encounter on visit preparation, documentation, and test ordering to care for this new patient, excluding any procedures performed.    Telemedicine Documentation Requirements    Originating site (Patient location): home  Distant site (Provider location): [place]  Provider and Title: Westley HummerAmbareen , MD  Consent obtained: YES/NO: Yes  Language, if applicable and if translator was required: [place]     Signed by: Devota PaceAmbareen G , MD

## 2022-10-13 ENCOUNTER — Encounter: Payer: Self-pay | Admitting: Obstetrics and Gynecology

## 2022-10-15 ENCOUNTER — Other Ambulatory Visit (INDEPENDENT_AMBULATORY_CARE_PROVIDER_SITE_OTHER): Payer: Self-pay | Admitting: Internal Medicine

## 2022-10-15 DIAGNOSIS — M62838 Other muscle spasm: Secondary | ICD-10-CM

## 2022-10-18 ENCOUNTER — Encounter (INDEPENDENT_AMBULATORY_CARE_PROVIDER_SITE_OTHER): Payer: Self-pay | Admitting: Internal Medicine

## 2022-10-18 ENCOUNTER — Other Ambulatory Visit (INDEPENDENT_AMBULATORY_CARE_PROVIDER_SITE_OTHER): Payer: Self-pay | Admitting: Obstetrics and Gynecology

## 2022-10-18 ENCOUNTER — Encounter (INDEPENDENT_AMBULATORY_CARE_PROVIDER_SITE_OTHER): Payer: Self-pay

## 2022-10-18 ENCOUNTER — Ambulatory Visit (INDEPENDENT_AMBULATORY_CARE_PROVIDER_SITE_OTHER): Payer: BLUE CROSS/BLUE SHIELD | Admitting: Clinical Cardiac Electrophysiology

## 2022-10-18 ENCOUNTER — Encounter (INDEPENDENT_AMBULATORY_CARE_PROVIDER_SITE_OTHER): Payer: Self-pay | Admitting: Obstetrics and Gynecology

## 2022-10-18 DIAGNOSIS — N898 Other specified noninflammatory disorders of vagina: Secondary | ICD-10-CM | POA: Insufficient documentation

## 2022-10-18 DIAGNOSIS — R109 Unspecified abdominal pain: Secondary | ICD-10-CM

## 2022-10-19 ENCOUNTER — Encounter (INDEPENDENT_AMBULATORY_CARE_PROVIDER_SITE_OTHER): Payer: Self-pay | Admitting: Internal Medicine

## 2022-10-25 ENCOUNTER — Ambulatory Visit (INDEPENDENT_AMBULATORY_CARE_PROVIDER_SITE_OTHER): Payer: BLUE CROSS/BLUE SHIELD | Admitting: Clinical Cardiac Electrophysiology

## 2022-10-25 VITALS — BP 124/70 | HR 85 | Ht 68.0 in | Wt 315.0 lb

## 2022-10-25 DIAGNOSIS — I428 Other cardiomyopathies: Secondary | ICD-10-CM

## 2022-10-25 DIAGNOSIS — I493 Ventricular premature depolarization: Secondary | ICD-10-CM

## 2022-10-25 LAB — ECG 12-LEAD
Atrial Rate: 85 {beats}/min
IHS MUSE NARRATIVE AND IMPRESSION: NORMAL
P Axis: 32 degrees
P-R Interval: 150 ms
Q-T Interval: 378 ms
QRS Duration: 74 ms
QTC Calculation (Bezet): 449 ms
R Axis: 54 degrees
T Axis: 56 degrees
Ventricular Rate: 85 {beats}/min

## 2022-10-25 NOTE — Progress Notes (Signed)
[facility] ARRHYTHMIA OFFICE VISIT    I had the pleasure of seeing Ms. Hailey [name] today for arrhythmia follow up.        She is a patient of Dr. Wilmon Arms.     The patient is a 41 year old woman who underwent ablation in 2005 at [facility] [place] for Wolff-Parkinson-White.  This took care of her syncope and sustained palpitations.     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  She sees Dr. Adin Hector.  She has a history of migraines, has seen Dr. Theodoro Grist.     We initially saw her in April, 2023.  She was getting episodes of lightheadedness, skipped beats, and heart rate monitor showing bradycardia.   Our EKG at that time showed bigeminy.  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  Coupling interval was variable.     3-day monitor showed 40% PVCs.  Echocardiogram showed ejection fraction 52% with mild global hypokinesis.  MRI showed no delayed gadolinium enhancement.  Left ventricular ejection fraction 32%.     On August 2, she underwent PVC ablation by Dr. Lucianne Muss.  Site of earliest activation was anterior mitral annulus.  There was immediate suppression of PVCs.  Additional RF delivered.     Follow-up monitor for 7 days showed a PVC burden of 0.1%.     Echocardiogram on November 14 showed ejection fraction 60%.    She is doing well, no complaints.  Denies any chest pain, LH, palpitations, syncope, SOB since last visit.        PMH:   Past Medical History:   Diagnosis Date    Abnormal Pap smear of cervix     Behcet's disease     She sees Dr. Adin Hector    BMI 45.0-49.9, adult     Diarrhea     occasional // poss. r/t meds    Headache     on Rx    Hx of ovarian cyst     Lower back pain     Meningitis 2011    Viral // treated and resolved    Neck pain     PT// Improved    Post-operative nausea and vomiting     PVC (premature ventricular contraction)     Scoliosis     moderate// used a Brace at a young age        MEDICATIONS:   Current Outpatient Medications   Medication Sig Dispense  Refill    Adalimumab 40 MG/0.4ML Pen-injector Kit Inject 0.4 mLs (40 mg) into the skin once a week 8 each 3    Apremilast (Otezla) 30 MG Tab Take 1 tablet (30 mg) by mouth 2 (two) times daily (Patient taking differently: Take 1 tablet (30 mg) by mouth 2 (two) times daily) 180 tablet 1    botulinum Toxin Type A (Botox) 200 units Recon Soln injection Inject 155 units IM every 12 weeks 1 each 3    busPIRone (BUSPAR) 5 MG tablet TAKE 1 TABLET (5 MG) BY MOUTH 3 (THREE) TIMES DAILY AS NEEDED (ANXIETY) 270 tablet 0    clindamycin (CLEOCIN T) 1 % lotion Apply topically every morning      colesevelam (WELCHOL) 625 MG tablet Take 1 tablet (625 mg) by mouth 2 (two) times daily with meals (Patient taking differently: Take 2 tablets (1,250 mg) by mouth every morning) 180 tablet 3    cyclobenzaprine (FLEXERIL) 5 MG tablet TAKE 1 TABLET FOR MUSCLE SPASMS AT NIGHT AS NEEDED 30 tablet 0  Dapsone 7.5 % Gel Apply topically every morning      EPINEPHrine (EPIPEN 2-PAK) 0.3 MG/0.3ML Solution Auto-injector injection Inject 0.3 mLs (0.3 mg) into the muscle once as needed (Anaphylaxis) ; go to ER after injection 1 each 1    famotidine (PEPCID) 20 MG tablet Take 1 tablet (20 mg) by mouth 2 (two) times daily as needed for Heartburn (with advil) 20 tablet 0    hydrOXYzine (ATARAX) 10 MG tablet Take 1 tablet (10 mg) by mouth nightly 90 tablet 3    ibuprofen (ADVIL) 600 MG tablet Take 1 tablet (600 mg) by mouth every 8 (eight) hours as needed for Pain 20 tablet 0    Jublia 10 % Solution       lidocaine-prilocaine (EMLA) cream Apply topically as needed Dermatology procedures      Multiple Vitamins-Minerals (MULTIVITAMIN ADULT PO) Take by mouth nightly      Omega-3 1000 MG Cap Take by mouth nightly      ondansetron (ZOFRAN-ODT) 4 MG disintegrating tablet TAKE 1 TABLET (4 MG) BY MOUTH EVERY 8 (EIGHT) HOURS AS NEEDED FOR NAUSEA 30 tablet 2    Rimegepant Sulfate 75 MG Tablet Dispersible Take 1 tablet (75 mg) by mouth every other day For migraine  prevention 16 tablet 11    rizatriptan (MAXALT) 10 MG tablet Take 1-2 tablets (10-20 mg total) by mouth once as needed for Migraine Max 3 days a week 36 tablet 3    valACYclovir (VALTREX) 1000 MG tablet Take 2 tabs at onset; repeat once in 12 hours.  Total 4 tabs per outbreak 12 tablet 3    vitamin D (CHOLECALCIFEROL) 25 MCG (1000 UT) tablet Take 1 tablet (1,000 Units) by mouth nightly      Azelastine-Fluticasone 137-50 MCG/ACT Suspension 1 spray by Each Nare route 2 (two) times daily as needed      fexofenadine (Allegra Allergy) 180 MG tablet Take 1 tablet (180 mg) by mouth daily       No current facility-administered medications for this visit.        Meds reviewed, no changes since last visit.    SH:   Social History     Tobacco Use    Smoking status: Never    Smokeless tobacco: Never   Vaping Use    Vaping Use: Never used   Substance Use Topics    Alcohol use: Not Currently     Alcohol/week: 0.0 standard drinks of alcohol     Comment: rarely    Drug use: Never       FH: no history of sudden death.  Family History reviewed and is otherwise not pertinent    REVIEW OF SYSTEMS: All other systems reviewed and negative except as above.    PHYSICAL EXAMINATION  General Appearance: A well-appearing female in no acute distress.   Vital Signs: BP 124/70 (BP Site: Right arm, Patient Position: Sitting, Cuff Size: Medium)   Pulse 85   Ht 1.727 m (5\' 8" )   Wt 142.9 kg (315 lb)   LMP  (LMP Unknown)   BMI 47.90 kg/m    Neck: Supple without jugular venous distention. Thyroid nonpalpable. Normal carotid upstrokes without bruits.  Chest: Clear to auscultation bilaterally with good air movement and respiratory effort and no wheezes, rales, or rhonchi  Cardiovascular: Normal S1 and physiologically split S2 without murmurs, gallops or rub. PMI of normal size and nondisplaced.     ECG: normal sinus rhythm at 85 bpm, normal axis and intervals, no significant ST/T  changes, normal EKG.      IMPRESSION/RECOMMENDATIONS: Ms. Hailey [name]  is a 41 y.o. female who presents for follow up.      History of ablation for WPW in the distant past.  More recently, frequent ventricular ectopy.  She underwent ablation in August.  Follow-up monitor showed a PVC burden of 0.1%.  Follow-up echo cardiogram showed normalization of ejection fraction.  She is feeling well.    No specific follow-up with us.  She is having abdominal pains, anticipates GYN surgery in the near future.  May proceed with acceptable cardiac risk.

## 2022-10-26 ENCOUNTER — Ambulatory Visit (INDEPENDENT_AMBULATORY_CARE_PROVIDER_SITE_OTHER): Payer: BLUE CROSS/BLUE SHIELD | Admitting: Sports Medicine

## 2022-10-27 ENCOUNTER — Encounter (INDEPENDENT_AMBULATORY_CARE_PROVIDER_SITE_OTHER): Payer: Self-pay | Admitting: Physician Assistant

## 2022-10-27 ENCOUNTER — Encounter (HOSPITAL_BASED_OUTPATIENT_CLINIC_OR_DEPARTMENT_OTHER): Payer: Self-pay | Admitting: Obstetrics and Gynecology

## 2022-10-27 ENCOUNTER — Ambulatory Visit (FREE_STANDING_LABORATORY_FACILITY): Payer: BLUE CROSS/BLUE SHIELD | Admitting: Physician Assistant

## 2022-10-27 VITALS — BP 141/93 | HR 95 | Ht 68.0 in | Wt 315.0 lb

## 2022-10-27 DIAGNOSIS — R030 Elevated blood-pressure reading, without diagnosis of hypertension: Secondary | ICD-10-CM | POA: Insufficient documentation

## 2022-10-27 DIAGNOSIS — Z8679 Personal history of other diseases of the circulatory system: Secondary | ICD-10-CM

## 2022-10-27 DIAGNOSIS — M352 Behcet's disease: Secondary | ICD-10-CM

## 2022-10-27 DIAGNOSIS — G43109 Migraine with aura, not intractable, without status migrainosus: Secondary | ICD-10-CM

## 2022-10-27 DIAGNOSIS — Z6841 Body Mass Index (BMI) 40.0 and over, adult: Secondary | ICD-10-CM

## 2022-10-27 DIAGNOSIS — R109 Unspecified abdominal pain: Secondary | ICD-10-CM

## 2022-10-27 DIAGNOSIS — I493 Ventricular premature depolarization: Secondary | ICD-10-CM

## 2022-10-27 DIAGNOSIS — N898 Other specified noninflammatory disorders of vagina: Secondary | ICD-10-CM

## 2022-10-27 DIAGNOSIS — I428 Other cardiomyopathies: Secondary | ICD-10-CM

## 2022-10-27 DIAGNOSIS — Z01818 Encounter for other preprocedural examination: Secondary | ICD-10-CM

## 2022-10-27 LAB — HEMOGLOBIN A1C
Average Estimated Glucose: 105.4 mg/dL
Hemoglobin A1C: 5.3 % (ref 4.6–5.6)

## 2022-10-27 NOTE — PEC In-Person Visit (H&P) (Signed)
Pre-Anesthesia Evaluation     Pre-op Interview visit requested by:   Reason for pre-op interview visit: Patient anticipating LAPAROSCOPY, DIAGNOSTIC, REMOVAL OF VAGINAL CYST, POSSIBLE LAPAROSCOPIC LYSIS OF ADHESIONS procedure.         History of Present Illness/Summary:  Patient presents to the [facility] clinic for a pre-operative evaluation.    Assessment/Plan:    1.  Preop examination [Z01.818]    Orders Placed This Encounter   Procedures    Hemoglobin A1C       2.  Surgical Diagnosis:  Abdominal pain, unspecified abdominal location [R10.9]  Vaginal cyst [N89.8]    3.  Hx of WPW syndrome s/p ablation in 2005, PVC s/p ablation 8/23 , Non ischemic cardiomyopathy, Elevated BP wout HTN  - Follows w [facility] Arrhythmia, Dr. Lujean Amel last OV 10/25/22- adv may proceed w acceptable cardiac risk   - Pt recently underwent ablation of PVC in 8/23 w f/u monitor showing min PVC  - EKG 08/02/22 NSR, normal EKG   - Echo 09/20/22:  The left ventricle is normal in size.    * There is normal left ventricular geometry.    * Left ventricular systolic function is normal with an ejection fraction by  Biplane Method of Discs of  60 %.  3D LVEF 58%.    * Left ventricular segmental wall motion is normal.    * Left ventricular diastolic filling parameters are consistent with Grade I  diastolic dysfunction (impaired relaxation pattern).    * The right ventricular cavity size is normal in size.    * Normal right ventricular systolic function.    * The left atrium is normal in size.    * The right atrium is normal in size.    * No significant valvular dysfunction.    * Compared to the prior study, LVEF improved.   - BP elev in clinic , pt attributes to anxiety, not on any meds   - Adv to monitor BP at home and f/u w PCP/ card if persists  - Pt denies any cardiac sx currently     4. Behcet's dz  - foll by rheum, Dr. Adin Hector  - Pt is on otezla , adalimubab    5. Migraine  - foll by neurology, Dr. Theodoro Grist  - Gets botox injections, on rimegipant and rizatriptan prn      6.  BMI 47.9  - Ordered A1C today     Patient is at elevated risk for perioperative CV complications 2/2 NICM, Hx of WPW, PVC, Obesity, however, not prohibitive to proceeding with planned surgery or procedure.    Lab results received of A1C wnl, reviewed CBC, BMP from 10/11/22, acceptable for procedure.      Patient voiced understanding of all instructions.  All questions and concerns addressed at this time. This assessment will be conveyed to the surgery and anesthesiology teams & the patient will be evaluated the morning of surgery.      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   09/25/2020    History of Wolff-Parkinson-White (WPW) syndrome Z86.79   09/25/2020    Overview Signed 09/25/2020  1:30 PM by Lurlean Horns, MD     Ablation 2005         Scoliosis deformity of spine M41.9   09/25/2020    Behcet's disease M35.2   02/12/2021    Immunocompromised state due to drug therapy D84.821, Z79.899   04/19/2021    S/P hysterectomy Z90.710   01/14/2022    Premature ventricular contractions (PVCs) (VPCs) I49.3   03/02/2022    Palpitations R00.2   03/02/2022    NICM (nonischemic cardiomyopathy) I42.8   05/17/2022    PVC's (premature ventricular contractions) I49.3   06/08/2022    Left hip pain M25.552   08/27/2022    Abdominal pain, unspecified abdominal location R10.9   10/18/2022    Vaginal cyst N89.8   10/18/2022    Elevated blood pressure reading in office without diagnosis of hypertension R03.0   10/27/2022        Medical History   Diagnosis Date     Anxiety     Arrhythmia     WPW ablation in 2005, PVC ablation 8/23    Behcet's disease     She sees Dr. Adin Hector    BMI 45.0-49.9, adult     Headache     migraine    Hx of ovarian cyst     Lower back pain     Meningitis 2011    Viral // treated and resolved    Post-operative nausea and vomiting     Scoliosis     moderate// used a Brace at a young age     Past Surgical History:   Procedure Laterality Date    ABLATION - PVC'S N/A 06/08/2022    Procedure: Ablation - PVC's;  Surgeon: [name], MD;  Location: [facility];  Service: Cardiovascular;  Laterality: N/A;  SAME DAY DISCHARGE  CARTO NO TEE    ARTHROTOMY, WRIST Right 12/03/2020    Procedure: RIGHT UPPER EXTREMITY SYNOVIAL BIOPSY;  Surgeon: Kellie Simmering, MD;  Location: [facility];  Service: Plastics;  Laterality: Right;    BIOPSY, LYMPH NODE N/A 07/23/2018    Procedure: BIOPSY, LYMPH NODE;  Surgeon: Shelda Pal, MD;  Location: [facility];  Service: ENT;  Laterality: N/A;  NECK DEEP LYMPH NODE BIOPSY    BONE MARROW BIOPSY  2020    led to behcet's dx    CARDIAC ABLATION  2005    [facility] for Wolff-Parkinson-White    CHOLECYSTECTOMY  2009    D&C DIAGNOSTIC  2010    DILATION AND CURETTAGE OF UTERUS  2010    FINGER GANGLION CYST EXCISION  04/2018    GANGLION CYST EXCISION  2014    04/2018 cyst removed from index finger-[facility]    HERNIA REPAIR  2016    HYSTERECTOMY  2012    HYSTEROSCOPY  2011    indication: Abnormal bleeding    UMBILICAL HERNIA REPAIR  2016    UPPER GASTROINTESTINAL ENDOSCOPY  2009    WISDOM TOOTH EXTRACTION  1998        Medication List            Accurate as of October 27, 2022 11:59 PM. Always use your most recent med list.  Adalimumab 40 MG/0.4ML Pnkt  Inject 0.4 mLs (40 mg) into the skin once a week  Medication Adjustments for Surgery: Take as prescribed  Notes to patient: Or per rheumatologist      Botox 200 units Solr injection  Inject 155 units IM every 12 weeks  Generic  drug: botulinum toxin type A  Medication Adjustments for Surgery: Hold day of surgery  Notes to patient: Last injection 11/23, next not due till end of January 2024     busPIRone 5 MG tablet  TAKE 1 TABLET (5 MG) BY MOUTH 3 (THREE) TIMES DAILY AS NEEDED (ANXIETY)  Commonly known as: BUSPAR  Medication Adjustments for Surgery: Take as prescribed     clindamycin 1 % lotion  Apply topically every morning  Commonly known as: CLEOCIN T  Medication Adjustments for Surgery: Hold day of surgery     colesevelam 625 MG tablet  Take 1 tablet (625 mg) by mouth 2 (two) times daily with meals  Commonly known as: WELCHOL  Medication Adjustments for Surgery: Take as prescribed     cyclobenzaprine 5 MG tablet  TAKE 1 TABLET FOR MUSCLE SPASMS AT NIGHT AS NEEDED  Commonly known as: FLEXERIL  Medication Adjustments for Surgery: Hold day of surgery     Dapsone 7.5 % Gel  Apply topically every morning  Medication Adjustments for Surgery: Hold day of surgery     EPINEPHrine 0.3 MG/0.3ML Soaj injection  Inject 0.3 mLs (0.3 mg) into the muscle once as needed (Anaphylaxis) ; go to ER after injection  Medication Adjustments for Surgery: Take as prescribed     hydrOXYzine 10 MG tablet  Take 1 tablet (10 mg) by mouth nightly  Commonly known as: ATARAX  Medication Adjustments for Surgery: Hold day of surgery     Jublia 10 % Soln  Generic drug: Efinaconazole  Medication Adjustments for Surgery: Hold day of surgery     lidocaine-prilocaine cream  Apply topically as needed Dermatology procedures  Commonly known as: EMLA  Medication Adjustments for Surgery: Hold day of surgery     MULTIVITAMIN ADULT PO  Take by mouth nightly  Medication Adjustments for Surgery: Stop 7 days before surgery     Omega-3 1000 MG Caps  Take by mouth nightly  Medication Adjustments for Surgery: Stop 7 days before surgery     ondansetron 4 MG disintegrating tablet  TAKE 1 TABLET (4 MG) BY MOUTH EVERY 8 (EIGHT) HOURS AS NEEDED FOR NAUSEA  Commonly known as:  ZOFRAN-ODT  Medication Adjustments for Surgery: Take as prescribed     Otezla 30 MG Tabs  Take 1 tablet (30 mg) by mouth 2 (two) times daily  Generic drug: Apremilast  Medication Adjustments for Surgery: Take as prescribed  Notes to patient: As advised by rheumatologist      Rimegepant Sulfate 75 MG Tbdp  Take 1 tablet (75 mg) by mouth every other day For migraine prevention  Medication Adjustments for Surgery: Take as prescribed     rizatriptan 10 MG tablet  Take 1-2 tablets (10-20 mg total) by mouth once as needed for Migraine Max 3 days a week  Commonly known as: MAXALT  Medication Adjustments for Surgery: Take as prescribed     valACYclovir 1000 MG tablet  Take 2 tabs at onset; repeat once in 12 hours.  Total 4 tabs per outbreak  Commonly known as: VALTREX  Medication Adjustments for Surgery: Take as prescribed     vitamin D 25 MCG (1000 UT) tablet  Take 1 tablet (1,000  Units) by mouth nightly  Commonly known as: CHOLECALCIFEROL  Medication Adjustments for Surgery: Hold day of surgery            Allergies   Allergen Reactions    Fruit Blend Flavor [Flavoring Agent] Anaphylaxis and Swelling     Tropical Fruit     Cyanoacrylate     Latex Hives, Swelling, Other (See Comments) and Drug-Induced Flushing    Adhesive [Wound Dressing Adhesive] Itching    Other Itching and Rash     Sutures Monocryl    Oxycodone Hallucinations     Social History     Occupational History    Occupation: Pensions consultant    Tobacco Use    Smoking status: Never    Smokeless tobacco: Never   Vaping Use    Vaping Use: Never used   Substance and Sexual Activity    Alcohol use: Not Currently     Alcohol/week: 0.0 standard drinks of alcohol     Comment: rarely    Drug use: Never    Sexual activity: Yes     Partners: Male     Birth control/protection: None     Comment: Hysterectomy       Menstrual History:   LMP / Status  Hysterectomy     No LMP recorded (lmp unknown). Patient has had a hysterectomy.    Tubal Ligation?  No valid surgical or medical  questions entered.           Exam Scores:   SDB score  Risk Category: No Risk    STBUR score       PONV score  Nausea Risk: VERY SEVERE RISK    MST score  MST Score: 0    Allergy score       Frailty score       MICA       RCRI score  RCRI Score: 1    DASI  DASI Score: 30.2  METs Level: 6.45    EA-DIVA            Visit Vitals  BP (!) 141/93   Pulse 95   Ht 1.727 m (5\' 8" )   Wt 142.9 kg (315 lb)   LMP  (LMP Unknown)   SpO2 96%   BMI 47.90 kg/m       Review of Systems   Gastrointestinal:  Positive for abdominal pain and diarrhea.   Musculoskeletal:  Positive for back pain and joint pain.   Psychiatric/Behavioral:  The patient is nervous/anxious.    All other systems reviewed and are negative.      Physical Exam:  Mallampati: III  TM distance: 3 FB (4-6 cm)  Mouth opening: 3 FB (4-6 cm)  Neck extension: full  Upper lip bite assessment: class II      Mental status: alert and oriented x3  Speech: normal    Heart rhythm: regular      (-) peripheral edema    Breath sounds clear to auscultation bilaterally          Pt walks w cane currently 2/2 left hip pain/ spasms     Recent Labs   CBC (last 180 days) 08/29/22  0319 10/11/22  1430   WBC 8.28 12.10*   RBC 3.99 4.59   Hgb 12.2 14.1   Hematocrit 37.6 42.2   MCV 94.2 91.9   MCH 30.6 30.7   MCHC 32.4 33.4   RDW 13 13   Platelets 350* 386*   MPV 8.6* 8.3*   Nucleated  RBC 0.0 0.0   Absolute NRBC 0.00 0.00     Recent Labs   BMP (last 180 days) 08/29/22  0319 10/11/22  1430   Glucose 91 95   BUN 10.0 11.0   Creatinine 0.7 0.8   Sodium 137 138   Potassium 3.8 4.4   Chloride 104 107   CO2 20 22   Calcium 9.0 10.2   Anion Gap 13.0 9.0   eGFR >60.0 >60.0         Recent Labs   Other (last 180 days) 08/27/22  0239 10/11/22  1430 10/27/22  0944   Bilirubin, Total 0.3 0.5  --    ALT 45 52  --    AST (SGOT) 27 37  --    Protein, Total 7.0 8.3  --    Hemoglobin A1C  --   --  5.3             Anesthesia Plan:  ASA 3     Potential anesthesia/Perioperative problems: No Problems  Identified    DNR status: Patient or surrogate requests that DNR status remain full code.  Discussed DNR status with: Patient      A discussion with regarding next steps to prepare for the procedure and the planned anesthesia care took place during today's visit.  I explained that the patient will meet with their anesthesiology providers on the DOS.  Discussed with Patient      Acceptability of blood products: Accepted  Use of blood products discussed with Patient

## 2022-11-06 ENCOUNTER — Encounter (INDEPENDENT_AMBULATORY_CARE_PROVIDER_SITE_OTHER): Payer: Self-pay

## 2022-11-08 ENCOUNTER — Telehealth: Payer: Self-pay | Admitting: Obstetrics and Gynecology

## 2022-11-08 MED ORDER — IBUPROFEN 200 MG PO TABS
600.0000 mg | ORAL_TABLET | Freq: Four times a day (QID) | ORAL | 1 refills | Status: DC | PRN
Start: 2022-11-08 — End: 2023-01-28

## 2022-11-08 MED ORDER — HYDROMORPHONE HCL 2 MG PO TABS
2.0000 mg | ORAL_TABLET | Freq: Four times a day (QID) | ORAL | 0 refills | Status: AC | PRN
Start: 2022-11-09 — End: 2022-11-12

## 2022-11-08 NOTE — Telephone Encounter (Signed)
Called patient to address any questions or concerns.     Patient had recent echo which was within normal limits.    Caprini score is 4. No anticoagulation required at this time.    Will hold humira until postop appt.      Allergic to oxy. Okay with dilaudid.  Patient to take zofran alongside.     Rx sent.    We discussed risks of bleeding, infection, pain, injury to surrounding organs including but not limited to bowel, bladder, ureter, vessels/nerves, wound complications/undesired scarring, blood transfusion,open procedure, and in rare cases long-term pain, MI, stroke, blood clots, disability or death.

## 2022-11-09 ENCOUNTER — Encounter (HOSPITAL_BASED_OUTPATIENT_CLINIC_OR_DEPARTMENT_OTHER)
Admission: RE | Disposition: A | Discharge status: Home / Self Care | Payer: Self-pay | Source: Ambulatory Visit | Attending: Obstetrics and Gynecology

## 2022-11-09 ENCOUNTER — Encounter (HOSPITAL_BASED_OUTPATIENT_CLINIC_OR_DEPARTMENT_OTHER): Payer: Self-pay | Admitting: Obstetrics & Gynecology

## 2022-11-09 ENCOUNTER — Ambulatory Visit
Admission: RE | Admit: 2022-11-09 | Discharge: 2022-11-09 | Disposition: A | Discharge status: Home / Self Care | Payer: BLUE CROSS/BLUE SHIELD | Source: Ambulatory Visit | Attending: Obstetrics and Gynecology | Admitting: Obstetrics and Gynecology

## 2022-11-09 ENCOUNTER — Ambulatory Visit: Payer: Self-pay

## 2022-11-09 ENCOUNTER — Ambulatory Visit (HOSPITAL_BASED_OUTPATIENT_CLINIC_OR_DEPARTMENT_OTHER): Payer: BLUE CROSS/BLUE SHIELD | Admitting: Anesthesiology

## 2022-11-09 DIAGNOSIS — R102 Pelvic and perineal pain: Secondary | ICD-10-CM | POA: Diagnosis present

## 2022-11-09 DIAGNOSIS — N838 Other noninflammatory disorders of ovary, fallopian tube and broad ligament: Secondary | ICD-10-CM | POA: Insufficient documentation

## 2022-11-09 DIAGNOSIS — R509 Fever, unspecified: Secondary | ICD-10-CM

## 2022-11-09 DIAGNOSIS — N898 Other specified noninflammatory disorders of vagina: Secondary | ICD-10-CM | POA: Insufficient documentation

## 2022-11-09 DIAGNOSIS — G43109 Migraine with aura, not intractable, without status migrainosus: Secondary | ICD-10-CM | POA: Insufficient documentation

## 2022-11-09 DIAGNOSIS — M25539 Pain in unspecified wrist: Secondary | ICD-10-CM

## 2022-11-09 DIAGNOSIS — G8929 Other chronic pain: Secondary | ICD-10-CM | POA: Diagnosis present

## 2022-11-09 DIAGNOSIS — I493 Ventricular premature depolarization: Secondary | ICD-10-CM | POA: Insufficient documentation

## 2022-11-09 DIAGNOSIS — R109 Unspecified abdominal pain: Secondary | ICD-10-CM

## 2022-11-09 DIAGNOSIS — M352 Behcet's disease: Secondary | ICD-10-CM | POA: Insufficient documentation

## 2022-11-09 DIAGNOSIS — R1032 Left lower quadrant pain: Secondary | ICD-10-CM | POA: Diagnosis present

## 2022-11-09 HISTORY — PX: LAPAROSCOPY, DIAGNOSTIC: SHX4584

## 2022-11-09 HISTORY — DX: Fever, unspecified: R50.9

## 2022-11-09 HISTORY — PX: LAPAROSCOPIC, SALPINGECTOMY: SHX4566

## 2022-11-09 HISTORY — PX: LAPAROSCOPIC, LYSIS, ADHESIONS: SHX4534

## 2022-11-09 SURGERY — LAPAROSCOPY, DIAGNOSTIC
Anesthesia: Anesthesia General | Site: Pelvis | Wound class: Clean Contaminated

## 2022-11-09 MED ORDER — PROPOFOL INFUSION 10 MG/ML
INTRAVENOUS | Status: DC | PRN
Start: 2022-11-09 — End: 2022-11-09
  Administered 2022-11-09: 20 ug/kg/min via INTRAVENOUS

## 2022-11-09 MED ORDER — LACTATED RINGERS IV SOLN
INTRAVENOUS | Status: DC
Start: 2022-11-09 — End: 2022-11-09

## 2022-11-09 MED ORDER — STERILE WATER FOR IRRIGATION IR SOLN
Status: DC | PRN
Start: 2022-11-09 — End: 2022-11-09
  Administered 2022-11-09: 1000 mL

## 2022-11-09 MED ORDER — BUPIVACAINE HCL (PF) 0.25 % IJ SOLN
INTRAMUSCULAR | Status: AC
Start: 2022-11-09 — End: ?
  Filled 2022-11-09: qty 30

## 2022-11-09 MED ORDER — ONDANSETRON HCL 4 MG/2ML IJ SOLN
4.0000 mg | Freq: Once | INTRAMUSCULAR | Status: DC | PRN
Start: 2022-11-09 — End: 2022-11-09

## 2022-11-09 MED ORDER — PHENYLEPHRINE 100 MCG/ML IV SOSY (WRAP)
PREFILLED_SYRINGE | INTRAVENOUS | Status: AC
Start: 2022-11-09 — End: ?
  Filled 2022-11-09: qty 10

## 2022-11-09 MED ORDER — LACTATED RINGERS IV SOLN
INTRAVENOUS | Status: DC | PRN
Start: 2022-11-09 — End: 2022-11-09

## 2022-11-09 MED ORDER — ACETAMINOPHEN 500 MG PO TABS
1000.0000 mg | ORAL_TABLET | Freq: Once | ORAL | Status: AC
Start: 2022-11-09 — End: 2022-11-09
  Administered 2022-11-09: 1000 mg via ORAL

## 2022-11-09 MED ORDER — GLYCOPYRROLATE 0.2 MG/ML IJ SOLN (WRAP)
INTRAMUSCULAR | Status: AC
Start: 2022-11-09 — End: ?
  Filled 2022-11-09: qty 2

## 2022-11-09 MED ORDER — DEXAMETHASONE SODIUM PHOSPHATE 4 MG/ML IJ SOLN (WRAP)
INTRAMUSCULAR | Status: DC | PRN
Start: 2022-11-09 — End: 2022-11-09
  Administered 2022-11-09: 8 mg via INTRAVENOUS

## 2022-11-09 MED ORDER — IBUPROFEN 600 MG PO TABS
600.0000 mg | ORAL_TABLET | Freq: Once | ORAL | Status: DC | PRN
Start: 2022-11-09 — End: 2022-11-09

## 2022-11-09 MED ORDER — HYDROMORPHONE HCL 2 MG PO TABS
ORAL_TABLET | ORAL | Status: AC
Start: 2022-11-09 — End: ?
  Filled 2022-11-09: qty 1

## 2022-11-09 MED ORDER — BUPIVACAINE HCL (PF) 0.25 % IJ SOLN
INTRAMUSCULAR | Status: DC | PRN
Start: 2022-11-09 — End: 2022-11-09
  Administered 2022-11-09: 20 mL

## 2022-11-09 MED ORDER — METOPROLOL TARTRATE 5 MG/5ML IV SOLN
2.5000 mg | INTRAVENOUS | Status: DC | PRN
Start: 2022-11-10 — End: 2022-11-09

## 2022-11-09 MED ORDER — MIDAZOLAM HCL 1 MG/ML IJ SOLN (WRAP)
INTRAMUSCULAR | Status: DC | PRN
Start: 2022-11-09 — End: 2022-11-09
  Administered 2022-11-09: 2 mg via INTRAVENOUS

## 2022-11-09 MED ORDER — MEPERIDINE HCL 25 MG/ML IJ SOLN
12.5000 mg | INTRAMUSCULAR | Status: DC | PRN
Start: 2022-11-09 — End: 2022-11-09

## 2022-11-09 MED ORDER — ONDANSETRON HCL 4 MG/2ML IJ SOLN
INTRAMUSCULAR | Status: DC | PRN
Start: 2022-11-09 — End: 2022-11-09
  Administered 2022-11-09: 4 mg via INTRAVENOUS

## 2022-11-09 MED ORDER — LIDOCAINE HCL (PF) 1 % IJ SOLN
INTRAMUSCULAR | Status: DC | PRN
Start: 2022-11-09 — End: 2022-11-09
  Administered 2022-11-09: 100 mg via INTRAVENOUS

## 2022-11-09 MED ORDER — FENTANYL CITRATE (PF) 50 MCG/ML IJ SOLN (WRAP)
50.0000 ug | INTRAMUSCULAR | Status: DC | PRN
Start: 2022-11-09 — End: 2022-11-09

## 2022-11-09 MED ORDER — HYDROMORPHONE HCL 2 MG PO TABS
2.0000 mg | ORAL_TABLET | Freq: Once | ORAL | Status: AC
Start: 2022-11-09 — End: 2022-11-09
  Administered 2022-11-09: 2 mg via ORAL

## 2022-11-09 MED ORDER — FENTANYL CITRATE (PF) 50 MCG/ML IJ SOLN (WRAP)
INTRAMUSCULAR | Status: DC | PRN
Start: 2022-11-09 — End: 2022-11-09
  Administered 2022-11-09 (×2): 50 ug via INTRAVENOUS

## 2022-11-09 MED ORDER — ACETAMINOPHEN 500 MG PO TABS
1000.0000 mg | ORAL_TABLET | Freq: Once | ORAL | Status: DC | PRN
Start: 2022-11-09 — End: 2022-11-09

## 2022-11-09 MED ORDER — PROPOFOL 10 MG/ML IV EMUL (WRAP)
INTRAVENOUS | Status: AC
Start: 2022-11-09 — End: ?
  Filled 2022-11-09: qty 100

## 2022-11-09 MED ORDER — FENTANYL CITRATE (PF) 50 MCG/ML IJ SOLN (WRAP)
INTRAMUSCULAR | Status: AC
Start: 2022-11-09 — End: ?
  Filled 2022-11-09: qty 2

## 2022-11-09 MED ORDER — HYDROMORPHONE HCL 1 MG/ML IJ SOLN
INTRAMUSCULAR | Status: AC
Start: 2022-11-09 — End: ?
  Filled 2022-11-09: qty 1

## 2022-11-09 MED ORDER — MIDAZOLAM HCL 1 MG/ML IJ SOLN (WRAP)
INTRAMUSCULAR | Status: AC
Start: 2022-11-09 — End: ?
  Filled 2022-11-09: qty 2

## 2022-11-09 MED ORDER — ROCURONIUM BROMIDE 50 MG/5ML IV SOLN
INTRAVENOUS | Status: AC
Start: 2022-11-09 — End: ?
  Filled 2022-11-09: qty 5

## 2022-11-09 MED ORDER — KETOROLAC TROMETHAMINE 30 MG/ML IJ SOLN
INTRAMUSCULAR | Status: DC | PRN
Start: 2022-11-09 — End: 2022-11-09
  Administered 2022-11-09: 30 mg via INTRAVENOUS

## 2022-11-09 MED ORDER — ROCURONIUM BROMIDE 10 MG/ML IV SOLN (WRAP)
INTRAVENOUS | Status: DC | PRN
Start: 2022-11-09 — End: 2022-11-09
  Administered 2022-11-09: 50 mg via INTRAVENOUS

## 2022-11-09 MED ORDER — SUCCINYLCHOLINE CHLORIDE 20 MG/ML IJ SOLN
INTRAMUSCULAR | Status: DC | PRN
Start: 2022-11-09 — End: 2022-11-09
  Administered 2022-11-09: 140 mg via INTRAVENOUS

## 2022-11-09 MED ORDER — PROPOFOL 10 MG/ML IV EMUL (WRAP)
INTRAVENOUS | Status: DC | PRN
Start: 2022-11-09 — End: 2022-11-09
  Administered 2022-11-09: 300 mg via INTRAVENOUS

## 2022-11-09 MED ORDER — SODIUM CHLORIDE (PF) 0.9 % IJ SOLN
INTRAMUSCULAR | Status: AC
Start: 2022-11-09 — End: ?
  Filled 2022-11-09: qty 10

## 2022-11-09 MED ORDER — LIDOCAINE HCL (PF) 2 % IJ SOLN
INTRAMUSCULAR | Status: AC
Start: 2022-11-09 — End: ?
  Filled 2022-11-09: qty 5

## 2022-11-09 MED ORDER — METOPROLOL TARTRATE 5 MG/5ML IV SOLN
5.0000 mg | Freq: Once | INTRAVENOUS | Status: DC | PRN
Start: 2022-11-09 — End: 2022-11-09

## 2022-11-09 MED ORDER — MORPHINE SULFATE 2 MG/ML IJ/IV SOLN (WRAP)
2.0000 mg | Status: DC | PRN
Start: 2022-11-09 — End: 2022-11-09

## 2022-11-09 MED ORDER — ACETAMINOPHEN 500 MG PO TABS
ORAL_TABLET | ORAL | Status: AC
Start: 2022-11-09 — End: ?
  Filled 2022-11-09: qty 2

## 2022-11-09 MED ORDER — GABAPENTIN 300 MG PO CAPS
300.0000 mg | ORAL_CAPSULE | Freq: Once | ORAL | Status: AC
Start: 2022-11-09 — End: 2022-11-09
  Administered 2022-11-09: 300 mg via ORAL

## 2022-11-09 MED ORDER — EPHEDRINE SULFATE 50 MG/ML IJ/IV SOLN (WRAP)
Status: AC
Start: 2022-11-09 — End: ?
  Filled 2022-11-09: qty 1

## 2022-11-09 MED ORDER — DEXAMETHASONE SODIUM PHOSPHATE 20 MG/5ML IJ SOLN
INTRAMUSCULAR | Status: AC
Start: 2022-11-09 — End: ?
  Filled 2022-11-09: qty 5

## 2022-11-09 MED ORDER — PROPOFOL 10 MG/ML IV EMUL (WRAP)
INTRAVENOUS | Status: AC
Start: 2022-11-09 — End: ?
  Filled 2022-11-09: qty 20

## 2022-11-09 MED ORDER — HYDRALAZINE HCL 20 MG/ML IJ SOLN
10.0000 mg | Freq: Once | INTRAMUSCULAR | Status: DC | PRN
Start: 2022-11-09 — End: 2022-11-09

## 2022-11-09 MED ORDER — SUGAMMADEX SODIUM 200 MG/2ML IV SOLN
INTRAVENOUS | Status: DC | PRN
Start: 2022-11-09 — End: 2022-11-09
  Administered 2022-11-09: 400 mg via INTRAVENOUS

## 2022-11-09 MED ORDER — KETOROLAC TROMETHAMINE 60 MG/2ML IM SOLN
INTRAMUSCULAR | Status: AC
Start: 2022-11-09 — End: ?
  Filled 2022-11-09: qty 2

## 2022-11-09 MED ORDER — HYDROMORPHONE HCL 1 MG/ML IJ SOLN
INTRAMUSCULAR | Status: DC | PRN
Start: 2022-11-09 — End: 2022-11-09
  Administered 2022-11-09: 1 mg via INTRAVENOUS

## 2022-11-09 MED ORDER — SODIUM CHLORIDE 0.9% BAG (IRRIGATION USE)
INTRAVENOUS | Status: DC | PRN
Start: 2022-11-09 — End: 2022-11-09
  Administered 2022-11-09: 1000 mL

## 2022-11-09 MED ORDER — ONDANSETRON HCL 4 MG/2ML IJ SOLN
INTRAMUSCULAR | Status: AC
Start: 2022-11-09 — End: ?
  Filled 2022-11-09: qty 2

## 2022-11-09 MED ORDER — GABAPENTIN 300 MG PO CAPS
ORAL_CAPSULE | ORAL | Status: AC
Start: 2022-11-09 — End: ?
  Filled 2022-11-09: qty 1

## 2022-11-09 SURGICAL SUPPLY — 72 items
ADHESIVE SKIN CLOSURE DERMABOND MINI .36 (Suture) ×4
ADHESIVE SKIN CLOSURE DERMABOND MINI .36 ML LIQUID APPLICATOR (Suture) ×4 IMPLANT
ADHESIVE SKNCLS 2 OCTYL CYNCRLT .36ML MN (Suture) ×4
APPLICATOR CHLORAPREP 26 ML 70% ISOPROPYL ALCOHOL 2% CHLORHEXIDINE (Applicator) ×4 IMPLANT
APPLICATOR PRP 70% ISPRP 2% CHG 26ML (Applicator) ×4
COVER TABLE L90 IN X W65 IN REINFORCE (Drape) ×4
COVER TABLE L90 IN X W65 IN REINFORCE HEAVY DUTY CONVERTORS POLY (Drape) ×4 IMPLANT
COVER TBL POLY CNVRT 90X65IN STRL REINF (Drape) ×4
DEVICE CLSR 0 GS-21 V-LOC 180 12IN ABS (Suture) ×4
DRAPE SRG IOBN 33X23IN STRL 2 INCS FLM (Drape) ×4
DRAPE SURGICAL 2 INCISE FILM (Drape) ×4
DRAPE SURGICAL 2 INCISE FILM ANTIMICROBIAL L33 IN X W23 IN IOBAN (Drape) ×4 IMPLANT
ELECTRODE ADULT PATIENT RETURN L9 FT REM POLYHESIVE ACRYLIC FOAM (Procedure Accessories) ×4 IMPLANT
ELECTRODE PATIENT RETURN L9 FT VALLEYLAB (Procedure Accessories) ×4
ELECTRODE PT RTN RM PHSV ACRL 9FT (Procedure Accessories) ×4
IRRIGATOR SUCTION ERGONOMIC HAND PIECE STRYKEFLOW II (Suction) ×4 IMPLANT
IRRIGATOR SUCTION STRYKEFLOW 2 (Suction) ×4
MANIPULATOR UT VCARE DX + (Procedure Accessories) ×4
MANIPULATOR UTERINE VCARE DX PLUS (Procedure Accessories) ×4 IMPLANT
PACK SURGICAL TLH ONCOLOGY (Pack) ×4
PAD SANITARY L12.25 IN X W4.25 IN HEAVY ABSORBENT MOISTURE BARRIER (Dressing) ×1 IMPLANT
PAD SNTR SLK FLF CRTY 12.25X4.25IN LF NS (Dressing) ×4
PASSER SUT CONE WECK EFX 9.82IN LF STRL (Procedure Accessories) ×4
POUCH 18X10IN LONG 2 ADHSV STRIP 3 COMPARTMENT STERI-DRAPE PLSTC (Drape) ×4 IMPLANT
POUCH INST PLS LNG STRDRP 18X10IN STRL 2 (Drape) ×4
POUCH L18 IN X W10 IN LONG 2 ADHESIVE (Drape) ×4
POUCH SPEC RTRVL PU E-CTCH GLD 10MM 34.5 (Laparoscopy Supplies) ×4
POUCH SPECIMEN RETRIEVAL L34.5 CM (Laparoscopy Supplies) ×4
POUCH SPECIMEN RETRIEVAL L34.5 CM ERGONOMIC HANDLE LONG CYLINDRICAL (Laparoscopy Supplies) ×1 IMPLANT
SHEARS ELECTROSURGICAL L36 CM CURVE 3 (Other) IMPLANT
SHEARS ESURG CRV HRMN A+7 5MM 36CM LF (Other)
SLEEVE LAPAROSCOPIC L100 MM ADVANCE (Procedure Accessories) ×12
SLEEVE LAPAROSCOPIC L100 MM ADVANCE FIXATION CANNULA SEAL OD5 MM KII (Procedure Accessories) ×12 IMPLANT
SLEEVE LAPSCP KII 5MM 100MM LF STRL ADV (Procedure Accessories) ×12
SOLUTION SRGSCRB 4% CHG 4OZ HIBI LF BTL (Scrub Supplies) ×4
SOLUTION SURGICAL SCRUB BOTTLE LIQUID (Scrub Supplies) ×4
SOLUTION SURGICAL SCRUB BOTTLE LIQUID ANTIMICROBIAL HIBICLENS 4% (Scrub Supplies) ×4 IMPLANT
STRIP SKIN CLOSURE L4 IN X W1/2 IN (Dressing) ×4
STRIP SKIN CLOSURE L4 IN X W1/2 IN REINFORCE STERI-STRIP POLYESTER (Dressing) ×1 IMPLANT
STRIP SKNCLS PLSTR STRSTRP 4X.5IN LF (Dressing) ×4
SUTURE ABS 4-0 PS2 MNCRL MTPS 27IN MFL (Suture) ×8
SUTURE MONOCRYL 4-0 PS-2 L27 IN (Suture) ×8
SUTURE MONOCRYL 4-0 PS-2 L27 IN MONOFILAMENT UNDYED ABSORBABLE (Suture) ×8 IMPLANT
SUTURE PASSER CONE WECK EFX L9.82 IN (Procedure Accessories) ×1 IMPLANT
SUTURE V-LOC 180 0 GS-21 1/2 CIRCLE L12 IN ABS GREEN (Suture) ×4 IMPLANT
SUTURE V-LOC 180 0 GS-21 L12 IN (Suture) ×4
SYRINGE 30 ML LUER LOCK MEDLINE MEDICAL (Needles) ×4 IMPLANT
SYRINGE MED 30ML LF STRL LL (Needles) ×4
SYSTEM POSITIONING BUILT IN ARM (Procedure Accessories) ×4
SYSTEM POSITIONING BUILT IN ARM PROTECTOR ANTISKID STRIP ADJUSTABLE (Procedure Accessories) ×4 IMPLANT
SYSTEM SURGYPAD POSITIONING 36IN (Procedure Accessories) ×4
TIP SCISSORS ENDOCUT L19.3 MM (Instrument) ×4 IMPLANT
TIP SCSR ENDCT 19.3MM DISP (Instrument) ×4
TOWEL L27 IN X W17 IN COTTON PREWASH (Other) ×4
TOWEL L27 IN X W17 IN COTTON PREWASH DELINT HIGH ABSORBENT BLUE (Other) ×4 IMPLANT
TOWEL SRG CTTN 27X17IN LF STRL PREWASH (Other) ×4
TRAY 1LYR 16FR 10ML LATEX DB SAFES (Tray) ×4 IMPLANT
TRAY SKIN SCRUB L8 IN 6 WING 6 SPONGE STICK 2 TIP APPLICATOR DRY VINYL (Prep) ×4 IMPLANT
TRAY SKIN SCRUB MEDLINE L8 IN VINYL (Prep) ×4
TRAY SKN SCRB VNYL CTTN 8IN LF STRL 6 (Prep) ×4
TRAY SRG TILH ONCOLOGY IFMC (Pack) ×4
TRAY SURGICAL TLH ONCOLOGY (Pack) ×4 IMPLANT
TROCAR LAPAROSCOPIC ADVANCE FIXATION (Instrument)
TROCAR LAPAROSCOPIC ADVANCE FIXATION (Laparoscopy Supplies) ×4
TROCAR LAPAROSCOPIC ADVANCE FIXATION SLEEVE RETENTION DISC L100 MM (Instrument) IMPLANT
TROCAR LAPAROSCOPIC ADVANCE FIXATION SLEEVE RETENTION DISC L100 MM OD5 (Laparoscopy Supplies) ×4 IMPLANT
TROCAR LAPSCP KII 11MM 100MM LF STRL ADV (Instrument)
TROCAR LAPSCP KII 5MM 100MM LF STRL ADV (Laparoscopy Supplies) ×4
TUBING INSFL THRMPLST 45L PNEUMOSURE (Tubing) ×4
TUBING INSUFFLATION SET HIGH FLOW (Tubing) ×4
TUBING INSUFFLATION SET HIGH FLOW TOUCHSCREEN PNEUMOSURE THERMOPLASTIC (Tubing) ×4 IMPLANT
TUBING SCT IRR (Suction) ×4

## 2022-11-09 NOTE — Discharge Instructions (Signed)
Anesthesia Discharge Instructions: After Your Surgery  You've just had surgery. During surgery you were given medicine called anesthesia to keep you relaxed and comfortable. After surgery you may have some pain or nausea. This is common. Your doctor or nurse will show you how to take care of yourself when you go home. Here are some tips for feeling better and getting well after surgery.    For the first 24 hours after your surgery:  Do not drive or use heavy equipment.   Do not make important decisions, sign legal papers, or be solely responsible for a dependent.  Do not drink alcohol, use recreational substances, take non prescribed medications or sleeping medications.  Have someone stay with you, if possible. He or she can watch for problems and help keep you safe.  Have an adult family member or friend drive you home.    Managing Pain: If you have pain after surgery, pain medicine will help you feel better. Take it as ordered by your surgeon. Also, ask your doctor about other ways to control pain. This might be with rest, ice, repositioning, and elevation. Follow any other instructions your surgeon or nurse gives you.  Tips for taking pain medicine:  Most pain relievers taken by mouth need at least 30 minutes to start to work.  Taking medicine on a schedule can help you remember to take it. Try to time your medicine so that you can take it before starting an activity. This might be before you get dressed, or go for a walk.  Common side effects of prescription pain medications  Pain medicines can cause nausea. Eat a little food before taking pain medicine to avoid this.  Constipation is a common side effect of pain medicines. Drinking lots of fluids and eating foods, such as fruits and vegetables, that are high in fiber can also help.   Drinking alcohol and taking pain medicines can cause dizziness and slow your breathing. It can even be deadly. Do not drink alcohol while taking prescription pain  medicines.  Narcotic pain medicines can make you react more slowly to things. Do not drive or run machinery while taking pain medicines.  Important facts about Acetaminophen (Tylenol)  Acetaminophen, also known as Tylenol is a common, over the counter pain reliever.  Your health care provider may tell you to take acetaminophen to help ease your pain. Ask him or her how much you should take each day.   Check all your prescription and OTC pain medication labels to see if it contains acetaminophen. More than 3000mg of acetaminophen in 24 hours may lead to liver failure.        Important facts about Ibuprofen (Advil; Motrin)  Ibuprofen is a non-steroidal anti-inflammatory drug (NSAID) that works to decrease inflammation which in turn provides pain relief.  Check with your surgeon if ibuprofen is allowed after surgery.   Ask him or her how much you should take each day.   Alternating between Tylenol and Ibuprofen is an effective way to manage pain is to alternate your medications every 4 hours between acetaminophen and ibuprofen.  Please check with your nurse if you received any Tylenol, Ibuprofen, or Toradol during your perioperative period. If so, please time next schedule dose from Last Dose Given in hospital.    Last Dose of Tylenol:  Next Tylenol available after _____ if needed.   Last Dose of Ibuprofen/Toradol: Next Ibuprofen available after_____ if needed.   Last Dose of Narcotic Pain Medicine: Next dose available after   ______ if needed.       Managing nausea:  Some people have an upset stomach after surgery. This is often because of anesthesia, pain, pain medicines, or the stress of surgery.   Start off with clear liquids and soup.  Progress as tolerated to solid foods. We recommend small meals, and bland diet. No fatty, rich, or spicy foods.  Take pain medicines with a small amount of food, such as crackers or toast, to decrease nausea.     If you have obstructive sleep apnea (OSA): After surgery, you may have  more apnea spells because anesthesia and other medicines.These episodes may last longer than usual.   Keep using the continuous positive airway pressure (CPAP) device when you sleep. Unless your health care provider tells you not to, use it when you sleep, or take a nap; day or night. CPAP is a common device used to treat OSA.  Sleep/nap in upright position(ie. with pillows), or use a recliner for first week and/or while on opioid medications or medications that are sedating.   Use your incentive spirometer or practice active deep breathing.  Another adult needs to be with you at all times during the first 24 hours home.  Limit opioid use when possible and use alternative comfort measures.   Talk with your provider before taking any pain medicine, muscle relaxants, or sedatives.     Activity:  Be careful when you are walking around, you may experience dizziness. Changing positions slowly will help decrease the risk of sudden dizziness.   You may shower 24 hours after surgery unless otherwise advised by surgeon.  Do not swim or soak incisions for 2 weeks or until advised by surgeon  Rest often for the first 24 hours post op. Gradually increase your activity as you feel able. Refrain from strenuous activity until advised by your surgeon. If an activity causes you pain, listen to your body and stop.   If you had laparoscopic surgery, please refer to surgeon's instructions regarding any weight lifting restrictions.  If you had gynecological surgery, please refer to surgeon's instructions regarding pelvic restrictions (intercourse, use of tampons, etc.)    Call your surgeon at their office if:  Your pain is not tolerable an hour after taking pain medicine.   If you are experiencing bladder discomfort and/or are unable to urinate within 6 hours since the end of your procedure.  You develop a rash, itching, or hives  Have nausea and vomiting that persists past 24 hours.  If you have any questions or concerns

## 2022-11-09 NOTE — PACU (Signed)
Reviewed discharge instructions with patient and spouse.  Offered to print discharge instructions for patient. Patient accepted.

## 2022-11-09 NOTE — Anesthesia Preprocedure Evaluation (Signed)
Anesthesia Evaluation    AIRWAY    Mallampati: III    TM distance: <3 FB  Neck ROM: full  Mouth Opening:full   CARDIOVASCULAR    cardiovascular exam normal       DENTAL    no notable dental hx               PULMONARY    pulmonary exam normal     OTHER FINDINGS    ===============================================================  Inpatient Anesthesia Evaluation    Patient Name: Hailey [name], Hailey [name]  Surgeon: Lindell Noe, MD  Patient Age / Sex: 42 y.o. 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 Lauro Regulus  No date: BMI 45.0-49.9, adult  No date: Headache      Comment:  migraine  No date: Hx of ovarian cyst  No date: Lower back pain  2011: Meningitis      Comment:  Viral // treated and resolved  No date: Post-operative nausea and vomiting  No date: Scoliosis      Comment:  moderate// used a Brace at a young age    Past Surgical History:  06/08/2022: ABLATION - PVC'S; N/A      Comment:  Procedure: Ablation - PVC's;  Surgeon: Jaynie Bream,                MD;  Location: [facility];  Service: Cardiovascular;                 Laterality: N/A;  SAME DAY DISCHARGECARTO NO TEE  12/03/2020: ARTHROTOMY, WRIST; Right      Comment:  Procedure: RIGHT UPPER EXTREMITY SYNOVIAL BIOPSY;                 Surgeon: Lenox Ahr, MD;  Location: [facility];                Service: Plastics;  Laterality: Right;  07/23/2018: BIOPSY, LYMPH NODE; N/A      Comment:  Procedure: BIOPSY, LYMPH NODE;  Surgeon: Neldon Labella, MD;  Location: [facility];  Service: ENT;                 Laterality: N/A;  NECK DEEP LYMPH NODE BIOPSY  2020: BONE MARROW BIOPSY      Comment:  led to behcet's dx  2005: [place]:  [facility] for                Wolff-Parkinson-White  2009: CHOLECYSTECTOMY  2010: [facility] DIAGNOSTIC  2010: [place]  04/2018: FINGER GANGLION CYST EXCISION  2014: GANGLION CYST EXCISION      Comment:  04/2018 cyst removed from index finger- [facility]  2016: HERNIA REPAIR  2012: HYSTERECTOMY  2011: HYSTEROSCOPY      Comment:  indication: Abnormal bleeding  6440: UMBILICAL HERNIA REPAIR  2009: UPPER GASTROINTESTINAL ENDOSCOPY  1998: WISDOM TOOTH EXTRACTION      Allergies:   -- Fruit Blend Flavor [Flavoring Agent] -- Anaphylaxis and Swelling    --  Tropical Fruit   -- Cyanoacrylate    --  Latex -- Hives, Swelling, Other (See                            Comments) and Drug-Induced Flushing   -- Adhesive [Wound Dressing Adhesive] -- Itching   -- Other -- Itching and Rash    --  Sutures Monocryl   -- Oxycodone -- Hallucinations      Medications:  Current Facility-Administered Medications:  lactated ringers infusion, , Intravenous, Continuous              Prior to Admission medications :  Medication Apremilast (Otezla) 30 MG Tab, Sig Take 1 tablet (30 mg) by mouth 2 (two) times dailyPatient taking differently: Take 1 tablet (30 mg) by mouth 2 (two) times daily, Start Date 05/05/22, End Date , Taking? Yes, Authorizing Provider Baltazar Najjar, MD    Medication Azelastine-Fluticasone 137-50 MCG/ACT Suspension, Sig 1 spray by Nasal route 2 (two) times daily, Start Date 11/08/22, End Date , Taking? Yes, Authorizing Provider [provider]    Medication busPIRone (BUSPAR) 5 MG tablet, Sig TAKE 1 TABLET (5 MG) BY MOUTH 3 (THREE) TIMES DAILY AS NEEDED (ANXIETY), Start Date 03/20/22, End Date , Taking? Yes, Authorizing Provider Born, [place], MD    Medication clindamycin (CLEOCIN T) 1 % lotion, Sig Apply topically every morning, Start Date 11/25/21, End Date , Taking? Yes, Authorizing Provider [provider]    Medication colesevelam (WELCHOL) 625 MG tablet, Sig Take 1 tablet (625 mg) by mouth 2 (two) times daily with mealsPatient taking differently: Take 2 tablets (1,250 mg) by mouth  every morning, Start Date 12/02/21, End Date , Taking? Yes, Authorizing Provider Virgilio Frees, MD    Medication cyclobenzaprine (FLEXERIL) 5 MG tablet, Sig TAKE 1 TABLET FOR MUSCLE SPASMS AT NIGHT AS NEEDED, Start Date 09/14/22, End Date , Taking? Yes, Authorizing Provider Baltazar Najjar, MD    Medication Dapsone 7.5 % Gel, Sig Apply topically every morning, Start Date 10/12/21, End Date , Taking? Yes, Authorizing Provider [provider]    Medication hydrOXYzine (ATARAX) 10 MG tablet, Sig Take 1 tablet (10 mg) by mouth nightly, Start Date 12/02/21, End Date , Taking? Yes, Authorizing Provider Virgilio Frees, MD    Medication Jublia 10 % Solution, Sig , Start Date 09/13/22, End Date , Taking? Yes, Authorizing Provider [provider]    Medication lidocaine-prilocaine (EMLA) cream, Sig Apply topically as needed Dermatology procedures, Start Date 11/23/21, End Date , Taking? Yes, Authorizing Provider [provider]    Medication Multiple Vitamins-Minerals (MULTIVITAMIN ADULT PO), Sig Take by mouth nightly, Start Date , End Date , Taking? Yes, Authorizing Provider [provider]    Medication Omega-3 1000 MG Cap, Sig Take by mouth nightly, Start Date , End Date , Taking? Yes, Authorizing Provider [provider]    Medication ondansetron (ZOFRAN-ODT) 4 MG disintegrating tablet, Sig TAKE 1 TABLET (4 MG) BY MOUTH EVERY 8 (EIGHT) HOURS AS NEEDED FOR NAUSEA, Start Date 04/25/22, End Date , Taking? Yes, Authorizing Provider Virgilio Frees, MD    Medication Rimegepant Sulfate 75 MG Tablet Dispersible, Sig Take 1 tablet (75 mg) by mouth every other day For migraine prevention, Start Date 09/12/22, End Date , Taking? Yes, Authorizing Provider Waymon Amato, FNP    Medication rizatriptan (MAXALT) 10 MG tablet, Sig Take 1-2 tablets (10-20 mg total) by mouth once as needed for Migraine Max 3 days a week, Start Date 06/14/21, End Date , Taking? Yes,  Authorizing Provider  Vivi Martens, MD PhD    Medication valACYclovir (VALTREX) 1000 MG tablet, Sig Take 2 tabs at onset; repeat once in 12 hours.  Total 4 tabs per outbreak, Start Date 07/25/22, End Date , Taking? Yes, Authorizing Provider Virgilio Frees, MD    Medication vitamin D (CHOLECALCIFEROL) 25 MCG (1000 UT) tablet, Sig Take 1 tablet (1,000 Units) by mouth nightly, Start Date , End Date , Taking? Yes, Authorizing Provider [provider]    Medication Adalimumab 40 MG/0.4ML Pen-injector Kit, Sig Inject 0.4 mLs (40 mg) into the skin once a week, Start Date 08/04/22, End Date 08/04/23, Taking? , Authorizing Provider Baltazar Najjar, MD    Medication botulinum Toxin Type A (Botox) 200 units Recon Soln injection, Sig Inject 155 units IM every 12 weeks, Start Date 03/15/22, End Date , Taking? , Authorizing Provider Bertram Denver, MD    Medication EPINEPHrine (EPIPEN 2-PAK) 0.3 MG/0.3ML Solution Auto-injector injection, Sig Inject 0.3 mLs (0.3 mg) into the muscle once as needed (Anaphylaxis) ; go to ER after injection, Start Date 12/02/21, End Date 12/02/22, Taking? , Authorizing Provider Virgilio Frees, MD    Medication HYDROmorphone (DILAUDID) 2 MG tablet, Sig Take 1 tablet (2 mg) by mouth every 6 (six) hours as needed for Pain, Start Date 11/09/22, End Date 11/12/22, Taking? , Authorizing Provider Devota Pace, MD    Medication ibuprofen (ADVIL) 200 MG tablet, Sig Take 3 tablets (600 mg) by mouth every 6 (six) hours as needed for Pain, Start Date 11/08/22, End Date , Taking? , Authorizing Provider Jan, Ambareen G, MD      Vitals  Temp:  [36.9 C (98.5 F)] 36.9 C (98.5 F)  Heart Rate:  [94] 94  Resp Rate:  [16] 16  BP: (149)/(92) 149/92    Wt Readings from Last 3 Encounters:  11/09/22 : 142.4 kg (314 lb)  10/27/22 : 142.9 kg (315 lb)  10/25/22 : 142.9 kg (315 lb)    BMI (Estimated body mass index is 47.74 kg/m as calculated from the following:    Height as of this encounter: 1.727 m (5\' 8" ).    Weight as of this  encounter: 142.4 kg (314 lb).)  Temp Readings from Last 3 Encounters:  11/09/22 : 36.9 C (98.5 F) (Oral)  10/03/22 : 37.2 C (98.9 F)  08/29/22 : 36.9 C (98.5 F) (Oral)    BP Readings from Last 3 Encounters:  11/09/22 : (!) 149/92  10/27/22 : (!) 141/93  10/25/22 : 124/70    Pulse Readings from Last 3 Encounters:  11/09/22 : 94  10/27/22 : 95  10/25/22 : 85          Labs:  CBC:  Lab Results       Component                Value               Date                       WBC                      12.10 (H)           10/11/2022                 HGB  14.1                10/11/2022                 HCT                      42.2                10/11/2022                 PLT                      386 (H)             10/11/2022              Chemistries:  Lab Results       Component                Value               Date                       NA                       138                 10/11/2022                 K                        4.4                 10/11/2022                 CL                       107                 10/11/2022                 CO2                      22                  10/11/2022                 BUN                      11.0                10/11/2022                 CREAT                    0.8                 10/11/2022                 GLU                      95                  10/11/2022                 HGBA1C  5.3                 10/27/2022                 MG                       1.9                 08/27/2022                 CA                       10.2                10/11/2022                 ALT                      52                  10/11/2022                 AST                      37                  10/11/2022              Coags:  Lab Results       Component                Value               Date                       PT                       12.1 (L)            04/22/2018                 PTT                      31                   04/22/2018                 INR                      0.9                 04/22/2018            _____________________      Signed by: Robert Bellow, MD  11/09/22   7:19 AM    =============================================================                                            Relevant Problems   NEURO/PSYCH   (+) Migraine with aura and without status migrainosus, not intractable      CARDIO   (+) Behcet's disease   (+) Migraine with aura and without status migrainosus, not intractable   (+) PVC's (premature ventricular contractions)   (+) Premature  ventricular contractions (PVCs) (VPCs)               Anesthesia Plan    ASA 3     general                                                      Signed by: Robert Bellow, MD 11/09/22 7:18 AM

## 2022-11-09 NOTE — Transfer of Care (Signed)
Anesthesia Transfer of Care Note    Patient: Hailey [name]    Procedures performed: Procedure(s) with comments:  LAPAROSCOPY, DIAGNOSTIC - 49320  LAPAROSCOPIC LYSIS OF ADHESIONS  LAPAROSCOPIC, SALPINGECTOMY LEFT    Anesthesia type: General ETT    Patient location:Gyn PACU    Last vitals:   Vitals:    11/09/22 1007   BP: 120/77   Pulse: 92   Resp: 18   Temp: 36.6 C (97.9 F)   SpO2: 100%       Post pain: Patient not complaining of pain, continue current therapy      Mental Status:awake and alert     Respiratory Function: tolerating face mask    Cardiovascular: stable    Nausea/Vomiting: patient not complaining of nausea or vomiting    Hydration Status: adequate    Post assessment: no apparent anesthetic complications, no reportable events, and no evidence of recall    Signed by: Deberah Pelton, CRNA  11/09/22 10:07 AM

## 2022-11-09 NOTE — Discharge Instr - AVS First Page (Addendum)
Patient Discharge Instructions - Post GYN-Laparoscopy  Surgeon: Dr. Vanessa Kick  Today's Procedure: diagnostic laparoscopy, removal of left fallopian tube with cyst    1)  Pain Management  Following surgery, you will need pain medication which has been prescribed and are listed below.  Ideally, you will use the ibuprofen and tylenol together, every 8 hours (or 3 times a day) with food.  You will do this for a week, and then can reduce and stop if you feel well.  Ibuprofen (motrin) 800mg  by mouth every 8 hours for 1 week and then as needed (or Celebrex 200mg  by mouth every 12 hours for 1 week and then as needed for patients with gastritis).  Tylenol (acetaminophen) 500mg  tablets (take 2), every 8 hours for 1 week and then as needed.    If you have moderate or severe pain taking these medications, then you should take a narcotic in addition to these. Ideally, you would only need the narcotic the first couple days.  Try to avoid taking the medications on an empty stomach.               3.   Dilaudid, 1-2 tablets by mouth every 6 hours as needed for severe pain not being managed with ibuprofen/tylenol above.     Additionally, many find an ice pack applied to painful areas comforting for the first 24-48 hours.  After this, a heating pad can be helpful.      2)  Bowel Management  It is OK not to pass gas for 1-2 days after surgery.  The first bowel movement will arrive even later.  It is common to feel "gassy", distended, and uncomfortable.  You will get constipated unless you are careful.  In order to minimize this, please:  Begin taking Miralax as soon as you get home.  This was also prescribed for you.  Do this once or twice every day until you are: off the narcotics AND having normal, soft bowel movements.  You can and should start this prior to surgery if you are already constipated.  Avoid foods that make you gassy like: raw vegetables, beans, and dairy products in the first 48hrs.  If you are constipated despite the  miralax, buy a dulcolax rectal suppository or add senokot tablets to your regimen until you feel better.  If you are still having issues, consider a Fleet's enema.      3) Restrictions:  -Plan to be out of work for 2 weeks.  Occasionally, 3 weeks is necessary.  -Avoid lifting anything heavier than 20 pounds for the first 2 weeks  -Avoid vigorous high impact exercise for 4 weeks  -Avoid placing anything in the vagina for 2 weeks.   -Avoid driving until you are off the narcotic pain medication and feel you can slam on the brakes without pain.  -Return to your other normal activities in 2 weeks or sooner if you feel able.    -No soaking baths for the first 2 weeks while your incisions are still fresh.      4) Things you can and should do:  -Walk as much as you feel able  -Spend your day out of bed and moving around  -Go up stairs (gently at first)  -Shower and wash your abdominal incisions with mild soapy water daily.  Don't forget to belly button.  Gentle cleansing with a Q-tip daily is recommended.  -After 2 weeks, if there is still glue on your incisions, coat them daily with Vaseline jelly  or another oily substance (coca butter, aquaphor, mineral oil).  This will help to release the glue from your skin for better hygiene.      5)  Things to contact us for:  -Heavy vaginal bleeding (soaking more than 4 pads in a day).  It is normal to have light bleeding for up to 6 weeks.  -Fever greater than 100.73F  -Pain or nausea which is getting worse rather than better   -Inability to urinate   -Worsening redness or drainage from the incisions  -Any other concerns  In general, you should feel a little better every day that passes.      6) Follow-up appointment  Please make sure that you have an appointment scheduled in the office for 2-3 weeks after your surgery.    Contact Information:  Office: [phone].  For urgent issues or postop problems, please speak with the nurse or doctor on-call.

## 2022-11-09 NOTE — Op Note (Addendum)
FULL OPERATIVE NOTE    Date Time: 11/09/22 9:55 AM  Patient Name: Hailey [name], Hailey [name] (MRN: [id number])  Attending Physician: Lindell Noe, MD      Date of Operation:   11/09/2022    Providers Performing:   Surgeon(s) and Role:     * Lindell Noe, MD - Primary    Surgical First Assistant(s):   First Assistant: Christean Grief    Operative Procedure:   LAPAROSCOPY, DIAGNOSTIC: 15176 (CPT)  LAPAROSCOPIC LYSIS OF ADHESIONS: 16073 (CPT)  LAPAROSCOPIC, SALPINGECTOMY LEFT:     Preoperative Diagnosis:   Pre-Op Diagnosis Codes:     * Abdominal pain, unspecified abdominal location [R10.9]     * Vaginal cyst [N89.8]    Postoperative Diagnosis:   Abdominal pain  Paratubal cyst    Anesthesia:   General    Findings:   4 cm left paratubal cyst.  Adhesions of fallopian tube and cyst to left ovary and sigmoid colon.  Normal liver edge  No evidence of endometriosis  Normal appendix  Normal bilateral ovaries  Normal right fallopian tube  Indications:   Left lower quadrant pain    Operative Notes:   This is a 42 y.o. y/o patient with new onset of left lower abdominal pain. We discussed risks, benefits, and alternatives to surgical management, including but not limited to: bleeding, infection, damage to internal organs including bladder, intestines, rectum, ureters, blood vessels and nerves, risk of open procedure. We discussed inability to diagnose or cure her pain. She was in agreement with plan fto proceed and consent was signed and witnessed.     Procedure: The patient was taken to the operative room where general endotracheal anesthesia was induced without difficulty. She was placed in lithotomy position with allen stirrups with care to avoid hyperflexion or hyperextension of hips or knees. Her arms were tucked at her sides in [place] position with elbows and wrists well-padded. Bean bag was used to to prevent movement in trendelenburg position. She was prepped and draped in the usual sterile fashion. A foley  catheter was placed in the bladder. A sponge stick was placed in the vagina.     Palmer's point was infiltrated with 0.25% marcaine. A 5 mm skin incision was made. Direct entry was performed with Opti-view and proper placement confirmed with opening pressure of 5. Inspection below the site of entry was performed with no overt signs of injury. A survey was performed of the abdomen and pelvis with the above stated findings. 3 additional ports were placed in the umbilicus and bilateral lower quadrants under direct visualization.    The left fallopian tube was adherent to the sigmoid colon and left ovary. A 4-5 cm paratubal cyst was identified. The left fallopian tube was removed along with the cyst by carefully taking down the adhesions with the harmonic scalpel. The fallopian tube was then transected from the ovary and the mesosalpinx. Good hemostasis was noted.     The umbilical incision was then extended to 10 mm. Trocar was replaced and 96mm endocatch bag was placed and specimen removed and sent to pathology.     The abdomen was evacuated of CO2 gas. The ports were removed. The umbilical fascia was closed with 0 vicryl with Lisette Abu device. The skin was closed with 4.0 vicryl. The patient tolerated the procedure well and was awakened and extubated in the the operating room without difficulty . She was taken to the PACU in stable condition.     I was present, scrubbed  and actively participated for all portions of this procedure.            Estimated Blood Loss:   5 mL    Implants:   * No implants in log *       Drains:   Drains: no      Specimens:     ID Type Source Tests Collected by Time Destination   A : LEFT PARATUBAL CYST AND FALLOPIAN TUBE Not Applicable Fallopian Tubes SURGICAL PATHOLOGY Devota Pace, MD 11/09/2022 [phone]          Complications:   None    Signed by: Devota Pace, MD  [facility] OR

## 2022-11-09 NOTE — PACU (Signed)
Patient has O2 sats 92-93% in Phase 2. No SOB or chest pain. A&O x 4. Instructed patient and spouse on using IS at home today. Patient verbalized understanding. Called and spoke to Anesthesia => patient cleared to go home.

## 2022-11-09 NOTE — H&P (Signed)
Patient Name: Hailey [name]  Attending Physician: No att. providers found     CC: Gynecologic Exam (NP: Ref by Dr. Reita May for ovarian cysts and vaginal cuff cyst. Imaging: TVUS ordered- not completed in epic. Symptoms: LLQ pain)        History of Presenting Illness:   Hailey [name] is a 42 y.o. G0 who presents to the office with new onset of LLQ. History of  Bechet's and RA. She had a severe pain episode requiring hospital admission on the left side. She was admitted for abx and suspected infected joint. On MRI a 3 cm left ovarian cyst and known vaginal cuff cyst was found. 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 Follow up US showed normal ovaries and 4.4 cm cyst of the vaginal cuff, question if it has grown slightly from 3.5 cm, or difference in modality reflects this change. Dr. Reita May requested consultation. Patient reports stabbing LLQ pain that increases when she lies down or exacerbated by certain movements. The pain since hospitalization has decreased but still noticeable.      S/p ortho consultation who felt it wasn't her hip.      Reports longstanding dyspareunia - sharp pain after intercourse in the pelvis.  Reports some increased urinary frequency.  Has some nausea.   Does not notice any change in stool from baseline. Denies vomiting.      S/p hysterectomy in 2011 for abnormal bleeding.   Remote history of abnormal pap smear. Vaginal paps have been normal. Last pap in 2021.     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 Adin Hector    BMI 45.0-49.9, adult      Diarrhea       occasional // poss. r/t meds    Headache       on Rx    Lower back pain      Meningitis 2011     Viral // treated and resolved    Neck pain       PT//  Improved    Post-operative nausea and vomiting      PVC (premature ventricular contraction)      Scoliosis       moderate// used a Brace at a young age            Past Surgical History:      Past Surgical History         Past Surgical History:   Procedure Laterality Date    ABLATION - PVC'S N/A 06/08/2022     Procedure: Ablation - PVC's;  Surgeon: [name], MD;  Location: [facility];  Service: Cardiovascular;  Laterality: N/A;  SAME DAY DISCHARGE  CARTO NO TEE    ARTHROTOMY, WRIST Right 12/03/2020     Procedure: RIGHT UPPER EXTREMITY SYNOVIAL BIOPSY;  Surgeon: Kellie Simmering, MD;  Location: [facility];  Service: Plastics;  Laterality: Right;    BIOPSY, LYMPH NODE N/A 07/23/2018     Procedure: BIOPSY, LYMPH NODE;  Surgeon: Shelda Pal, MD;  Location: [facility];  Service: ENT;  Laterality: N/A;  NECK DEEP LYMPH NODE BIOPSY    BONE MARROW BIOPSY  [facility]    CHOLECYSTECTOMY   2009    D&C DIAGNOSTIC   2010    DILATION AND CURETTAGE OF UTERUS   2010    FINGER GANGLION CYST EXCISION   04/2018    GANGLION CYST EXCISION   2014     04/2018 cyst removed from index finger-[facility]    HERNIA REPAIR   2016    HYSTERECTOMY   2012    PELVIC LAPAROSCOPY   5670    UMBILICAL HERNIA REPAIR   2016    UPPER GASTROINTESTINAL ENDOSCOPY   2009    WISDOM TOOTH EXTRACTION   1998            OB History:      No LMP recorded (lmp unknown). Patient has had a hysterectomy.  OB History         Gravida   1    Para        Term        Preterm        AB   1    Living               SAB   1    IAB        Ectopic        Multiple        Live Births                         Medications          Current Outpatient Medications:     Adalimumab 40 MG/0.4ML Pen-injector Kit, Inject 0.4 mLs (40 mg) into the skin once a week, Disp: 8 each, Rfl: 3    Apremilast (Otezla) 30 MG Tab, Take 1 tablet (30 mg) by mouth 2 (two) times daily (Patient taking  differently: Take 1 tablet (30 mg) by mouth 2 (two) times daily), Disp: 180 tablet, Rfl: 1    Azelastine-Fluticasone 137-50 MCG/ACT Suspension, 1 spray by Each Nare route 2 (two) times daily as needed, Disp: , Rfl:     botulinum Toxin Type A (Botox) 200 units Recon Soln injection, Inject 155 units IM every 12 weeks, Disp: 1 each, Rfl: 3    busPIRone (BUSPAR) 5 MG tablet, TAKE 1 TABLET (5 MG) BY MOUTH 3 (THREE) TIMES DAILY AS NEEDED (ANXIETY), Disp: 270 tablet, Rfl: 0    clindamycin (CLEOCIN T) 1 % lotion, Apply topically every morning, Disp: , Rfl:     colesevelam (WELCHOL) 625 MG tablet, Take 1 tablet (625 mg) by mouth 2 (two) times daily with meals (Patient taking differently: Take 2 tablets (1,250 mg) by mouth every morning), Disp: 180 tablet, Rfl: 3    cyclobenzaprine (FLEXERIL) 5 MG tablet, TAKE 1 TABLET FOR MUSCLE SPASMS AT NIGHT AS NEEDED, Disp: 30 tablet, Rfl: 0    Dapsone 7.5 % Gel, Apply topically every morning, Disp: , Rfl:     EPINEPHrine (EPIPEN 2-PAK) 0.3 MG/0.3ML Solution Auto-injector injection, Inject 0.3 mLs (0.3 mg) into the muscle once as needed (Anaphylaxis) ; go to ER after injection, Disp: 1 each, Rfl: 1    famotidine (PEPCID) 20 MG tablet, Take 1 tablet (20 mg) by mouth 2 (two) times daily as needed for Heartburn (with advil), Disp: 20 tablet, Rfl: 0    fexofenadine (Allegra Allergy) 180 MG tablet, Take 1 tablet (180 mg) by  mouth daily, Disp: , Rfl:     hydrOXYzine (ATARAX) 10 MG tablet, Take 1 tablet (10 mg) by mouth nightly, Disp: 90 tablet, Rfl: 3    ibuprofen (ADVIL) 600 MG tablet, Take 1 tablet (600 mg) by mouth every 8 (eight) hours as needed for Pain, Disp: 20 tablet, Rfl: 0    Jublia 10 % Solution, , Disp: , Rfl:     lidocaine-prilocaine (EMLA) cream, Apply topically as needed Dermatology procedures, Disp: , Rfl:     Multiple Vitamins-Minerals (MULTIVITAMIN ADULT PO), Take by mouth nightly, Disp: , Rfl:     Omega-3 1000 MG Cap, Take by mouth nightly, Disp: , Rfl:     ondansetron  (ZOFRAN-ODT) 4 MG disintegrating tablet, TAKE 1 TABLET (4 MG) BY MOUTH EVERY 8 (EIGHT) HOURS AS NEEDED FOR NAUSEA, Disp: 30 tablet, Rfl: 2    Rimegepant Sulfate 75 MG Tablet Dispersible, Take 1 tablet (75 mg) by mouth every other day For migraine prevention, Disp: 16 tablet, Rfl: 11    rizatriptan (MAXALT) 10 MG tablet, Take 1-2 tablets (10-20 mg total) by mouth once as needed for Migraine Max 3 days a week, Disp: 36 tablet, Rfl: 3    valACYclovir (VALTREX) 1000 MG tablet, Take 2 tabs at onset; repeat once in 12 hours.  Total 4 tabs per outbreak, Disp: 12 tablet, Rfl: 3    vitamin D (CHOLECALCIFEROL) 25 MCG (1000 UT) tablet, Take 1 tablet (1,000 Units) by mouth nightly, Disp: , Rfl:         Allergies:            Allergies   Allergen Reactions    Fruit Blend Flavor [Flavoring Agent] Anaphylaxis and Swelling    Latex Other (See Comments), Hives and Swelling       Swelling and redness throughout body   Also severe allergy to surgical glue    Adhesive [Wound Dressing Adhesive] Itching       dermabond    Other Itching and Rash       Sutures Monocryl // and Tropical fruits like coconuts, pineapple, kiwi, etc.    Oxycodone Hallucinations         Psychosocial / Family History:         Family History   Family History   Adopted: Yes            Review of Systems:      Per hpi     Physical Exam:   BP (!) 149/92   Pulse 94   Temp 98.5 F (36.9 C) (Oral)   Resp 16   Ht 1.727 m (5\' 8" )   Wt 142.4 kg (314 lb)   LMP  (LMP Unknown)   SpO2 98%   BMI 47.74 kg/m        Gen: NAD, AAOx3  Lungs: CTAB  CVS: S1S2, RRR  GU: defer        ULTRASOUND PELVIC COMPLETE AND TRANSVAGINAL     HISTORY: Left lower quadrant pain.      COMPARISON: Pelvic ultrasound 03/18/2022.     TECHNIQUE: Both transabdominal and endovaginal imaging was performed.  Transabdominal examination of the pelvis was performed via partially  distended urinary bladder.     FINDINGS:     Measurements:  Uterus: Surgically absent   Right ovary: 2.5 x 2.8 x 2.8 cm  Left  ovary: 3.7 x 2.8 x 2.7 cm     Status post hysterectomy. A simple appearing cyst at the vaginal cuff is  redemonstrated measuring 4.4 x  3.5 x 3.9 cm, previously 3.8 x 2.9 x 3.8 cm.     Normal ovarian texture. No suspicious adnexal mass identified.     No free fluid.     IMPRESSION:         1. Status post hysterectomy. Simple 4.4 cm cyst at the vaginal cuff,  increased.        MRI FINDINGS:  Bone: The bone and bone marrow are normal. There is no evidence of fracture  or osteonecrosis. No abnormal offset is seen at the femoral head-neck  junction. The acetabular depth is normal.     Cartilage/Labrum: The articular cartilage is normal. No focal defects are  identified. There is a tear of the left anterosuperior acetabular labrum.  No para-labral cyst.     Joint: Trace left hip joint effusion without significantly enhancing  synovitis.. No loose bodies identified.     Muscles/Tendons: Mild to moderate left distal gluteus minimus tendinosis  and mild to moderate greater trochanteric bursitis including bursal edema.  There is mild left proximal hamstring tendinosis. Large field-of-view  images show mild right greater trochanteric bursitis.      Other: Large field-of-view axial images show moderate left and mild right  SI joint degenerative changes with anterior osteophytes, stable since  02/23/2021. Partial imaging of lower lumbar spine arthrosis.     Other: There is a 3.8 cm cyst adjacent to the vaginal cuff post  hysterectomy, not significant changed from prior exam stating to a pelvis  MRI from 02/03/2020. There has been interval enlargement of a left adnexal  cyst since pelvic ultrasound 03/18/2022, now measuring 3.3 cm.     IMPRESSION:      1.Trace left hip joint effusion without significant synovitis. Tear of the  left anterosuperior acetabular labrum. Otherwise unremarkable left hip  joint.     2.Mild to moderate left distal gluteus minimus tendinosis and mild to  moderate left greater trochanteric bursitis with  bursal edema. There is  mild left proximal hamstring tendinosis.      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     4.There has been interval enlargement of a left adnexal cyst since pelvic  ultrasound 03/18/2022, now measuring 3.3 cm. This could be further  evaluated with follow-up pelvic ultrasound.     5.Unchanged 3.8 cm cyst adjacent to the vaginal cuff post hysterectomy  since multiple prior exams favoring a benign etiology.      A/P  Plan for diag lap, removal of vaginal cyst, possible lysis of adhesions.  Records reviewed, no changes in medical history.  Consent signed.

## 2022-11-09 NOTE — Anesthesia Postprocedure Evaluation (Signed)
Anesthesia Post Evaluation    Patient: Hailey [name]    Procedure(s) with comments:  LAPAROSCOPY, DIAGNOSTIC - [phone]  LAPAROSCOPIC LYSIS OF ADHESIONS  LAPAROSCOPIC, SALPINGECTOMY LEFT    Anesthesia type: general    Last Vitals:   Vitals Value Taken Time   BP 120/59 11/09/22 1040   Temp 36.6 C (97.9 F) 11/09/22 1007   Pulse 92 11/09/22 1040   Resp 18 11/09/22 1050   SpO2 96 % 11/09/22 1040                 Anesthesia Post Evaluation:     Patient Evaluated: PACU    Level of Consciousness: awake    Pain Management: adequate    Airway Patency: patent        Anesthetic complications: No      PONV Status: none    Cardiovascular status: acceptable  Respiratory status: acceptable  Hydration status: acceptable          Signed by: Sula Rumple, MD, 11/09/2022 11:06 AM

## 2022-11-10 ENCOUNTER — Encounter (HOSPITAL_BASED_OUTPATIENT_CLINIC_OR_DEPARTMENT_OTHER): Payer: Self-pay | Admitting: Obstetrics and Gynecology

## 2022-11-10 ENCOUNTER — Telehealth (INDEPENDENT_AMBULATORY_CARE_PROVIDER_SITE_OTHER): Payer: Self-pay

## 2022-11-10 NOTE — Telephone Encounter (Signed)
Called patient s/p Dr. Mary Sella 11/09/22  Operative Procedure:   LAPAROSCOPY, DIAGNOSTIC: 75797 (CPT)  LAPAROSCOPIC LYSIS OF ADHESIONS: 28206 (CPT)  LAPAROSCOPIC, SALPINGECTOMY LEFT:     Doing well and meeting post-operative milestones.    Ambulating without difficulties, passing gas, tolerating regular diet without nausea or vomiting. Voiding spontaneously.    Denies vaginal bleeding, discharge from incisions sites, chills, fever, dizziness, SOB.    Discussed Pathology results will be either sent by provider or discussed at upcoming postop appointment.    Has follow up scheduled on 11/23/2022    Questions answered.     Sherilyn Cooter, LPN   [facility], [place]  [address] [place]  [place], [place] [address]          Office: [phone]          Fax: [phone]

## 2022-11-11 ENCOUNTER — Other Ambulatory Visit (INDEPENDENT_AMBULATORY_CARE_PROVIDER_SITE_OTHER): Payer: Self-pay | Admitting: Internal Medicine

## 2022-11-11 DIAGNOSIS — K120 Recurrent oral aphthae: Secondary | ICD-10-CM

## 2022-11-11 DIAGNOSIS — K760 Fatty (change of) liver, not elsewhere classified: Secondary | ICD-10-CM

## 2022-11-11 DIAGNOSIS — M352 Behcet's disease: Secondary | ICD-10-CM

## 2022-11-11 DIAGNOSIS — M0600 Rheumatoid arthritis without rheumatoid factor, unspecified site: Secondary | ICD-10-CM

## 2022-11-14 LAB — LAB USE ONLY - HISTORICAL SURGICAL PATHOLOGY

## 2022-11-15 ENCOUNTER — Encounter (INDEPENDENT_AMBULATORY_CARE_PROVIDER_SITE_OTHER): Payer: Self-pay | Admitting: Obstetrics and Gynecology

## 2022-11-16 DIAGNOSIS — R109 Unspecified abdominal pain: Secondary | ICD-10-CM | POA: Insufficient documentation

## 2022-11-16 DIAGNOSIS — N898 Other specified noninflammatory disorders of vagina: Secondary | ICD-10-CM | POA: Insufficient documentation

## 2022-11-22 ENCOUNTER — Encounter (INDEPENDENT_AMBULATORY_CARE_PROVIDER_SITE_OTHER): Payer: Self-pay | Admitting: Internal Medicine

## 2022-11-22 ENCOUNTER — Telehealth (INDEPENDENT_AMBULATORY_CARE_PROVIDER_SITE_OTHER): Payer: BLUE CROSS/BLUE SHIELD | Admitting: Internal Medicine

## 2022-11-22 DIAGNOSIS — J189 Pneumonia, unspecified organism: Secondary | ICD-10-CM

## 2022-11-22 DIAGNOSIS — R058 Other specified cough: Secondary | ICD-10-CM

## 2022-11-22 DIAGNOSIS — Z79899 Other long term (current) drug therapy: Secondary | ICD-10-CM

## 2022-11-22 DIAGNOSIS — M352 Behcet's disease: Secondary | ICD-10-CM

## 2022-11-22 DIAGNOSIS — M0600 Rheumatoid arthritis without rheumatoid factor, unspecified site: Secondary | ICD-10-CM

## 2022-11-22 MED ORDER — ADALIMUMAB 40 MG/0.4ML SC PNKT
40.0000 mg | PEN_INJECTOR | SUBCUTANEOUS | 1 refills | Status: DC
Start: 2022-11-22 — End: 2022-11-25

## 2022-11-22 MED ORDER — DOXYCYCLINE HYCLATE 100 MG PO CAPS
ORAL_CAPSULE | ORAL | 0 refills | Status: DC
Start: 2022-11-22 — End: 2022-11-25

## 2022-11-22 NOTE — Patient Instructions (Signed)
Dear Lyndon Code ,     Thanks for arranging a video visit with me.    Here are things I'd like you to do following today's visit:  Medication instructions:  -Pleasehold your next Humira dose  -Please start taking doxycycline 100 mg twice daily for one week until done.    -If you do not feel better the next 2-3 days or feel worse-please go to urgent care/ER    You may retrieve any orders from this visit in [place] by using these steps:    After signing into Mychart  1) click Messaging  2) click Letters  3) select the Requisition  4) Review and click print    Please contact the clinic at [phone] with any questions and to schedule your follow up visit.    Sincerely,    Tarry Kos, MD  Rheumatologist  [facility]  [address]  [place], [place] [address]  Phone: [phone]  Fax: [phone]

## 2022-11-22 NOTE — Progress Notes (Signed)
RHEUMATOLOGY FOLLOW UP TELEMEDICINE NOTE  Telemedicine Documentation Requirements  Originating site (Patient location): home  Distant site (Provider location): Office  Provider and Title: Dr. Adin Hector  Type of Visit: Video Visit  Verbal Consent has been obtained to conduct a telemedicine visit on this date in order to minimize exposure to COVID-19.      PCP: Virgilio Frees, MD    HPI:    This patient is a 42 y.o. year female with Past Medical History of Prior Cholecystectomy, Wold-Parkinson White, Prior Hysterectomy who returns for follow up of Behcet's HLAB51+, recurrent oral ulcers, pathergy, low grade fevers and seronegative arthritis.  -ablation procedure for arrythmia 06/2022  -She is on Humria weekly for inflammatory arthritis from Behcets  [facility] for recurrent aphthous ulcers  -She is on dapsone from Dermatology, clindamycin at night  Dermatologist started her on dapsone-she has had a few scars that are healing-had injections from her dermatologist.    Derm: Dr. Shellee Milo      Interval History:  Last visit 09/23/22      She has fever 101, cough, Has been off Humira.  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    Since her last visit she was hospitalized for left hip pain/lower pelvic pain.  She saw Ob/gyn-they will repeat pelvic US.  She will follow up with Gyn/Onc-ovarian cysts.  Her pelvic pain feels much better.    She says her cardiologist said she was fine-follow up with PCP yearly EKG.      She is coughing, worse laying back.  She is taking allegra D.Her symptoms started 5 days ago.      She is on Humira q 7 days and feels much better in terms of fatigue, joint pain.  Held this prior to surgery    Otezla 30 mg BID -mouth sores overall better.      She is schedule for GI in March 2024.           Previous Labs:  08/29/22  CBC platelets 350  BMP unremarkable  ESR wnl  CRP wnl      Imaging Reports  Reviewed:  09/19/22:  ECHO:  Left Ventricle    The left ventricle is normal in size. There is normal left ventricular  geometry. Left ventricular systolic function is normal with an ejection  fraction by Biplane Method of Discs of  60 %.  3D LVEF 58%. Left ventricular  segmental wall motion is normal. Left ventricular diastolic filling parameters  are consistent with Grade I diastolic dysfunction (impaired relaxation  pattern).  Right Ventricle    The right ventricular cavity size is normal in size. Normal right  ventricular systolic function.    08/27/22  MRI left Hip:  IMPRESSION:      1.Trace left hip joint effusion without significant synovitis. Tear of the  left anterosuperior acetabular labrum. Otherwise unremarkable left hip  joint.     2.Mild to moderate left distal gluteus minimus tendinosis and mild to  moderate left greater trochanteric bursitis with bursal edema. There is  mild left proximal hamstring tendinosis.      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     4.There has been interval enlargement of a left adnexal cyst since pelvic  ultrasound 03/18/2022, now measuring 3.3 cm. This could be further  evaluated with follow-up pelvic ultrasound.     5.Unchanged 3.8 cm cyst adjacent to the  vaginal cuff post hysterectomy  since multiple prior exams favoring a benign etiology.      Previous Labs:  07/25/22  ESR 37  CRP wnl  CMP ALt 39  CBC wnl      Imaging Reports Reviewed:  04/12/22  Cardiac MRI:  IMPRESSION:       1. No delayed gadolinium enhancement to suggest myocardial fibrosis or  scarring.  2. Normal right ventricular systolic function.  3. Qualitatively mild global left ventricular hypokinesis. Left ventricular  systolic function measures 32% and may be artifactually reduced secondary  to significant motion artifact.    Labs Reviewed:  04/19/22  Throat culture no B-hemolytic strep  POCT flu A/B  Abbot -      Previous Labs:  01/14/22  CMP wnl  CRP wnl  ViT D 31  ESR wnl  Lipid  panel wnl  TSH wnl      08/18/21  CBC wnl  CMP wnl  ESR wnl  CRP wnl    Previous Labs:  05/25/21  Quant Gold-  HepbSAb wnl  HepbSAg-  HepCAb-  HIV-        Previous Labs:  02/23/21  ESR wnl  CRP wnl  CMP ALT 42, AST normal    CRP wnl  ESR wnl  HLAB51 positive  IgE, IgA, IGG, IgM all wnl  CBC wnl    12/03/20  DIAGNOSIS:          A. Lesion, ulnar wrist, excision:     -Fibroadipose tissue and vasculature          B. Synovial tissue, biopsy:     -Fibroadipose tissue and synovium (see comment)          Comment:     Morphologic features typical of rheumatoid arthritis or infection are     not identified.    12/03/20  Bacterial Cultures: No Growth  Fungal Cultures: NG at one week  AFB: No growth    11/20/20  ESR 50  CRP wnl  IgG subclasses wnl  ANA comprehensive panel negative        Previous Labs:    [facility] Labs:  10/23/20  C-ANCA, P-ANCA, MPO, PR3 all negative  Mitochondrial Ab-  ANA IFA-  Scl70-  Centromere-  RNP-  SMith-  TPPA Ab-      05/18/20  Ferritin 291  CMP AST/A:T 59/70  CBC elevated lymphocytes    02/06/20  MPO-  PR3-  SSA-  aCL IgM 14  aCL IgG, IgA -  Lupus anticoagulant -  B2IgG, IgM -  ACE wnl  CCP-  RF-  Vit D 31  TSH wnl  ESR wnl  CK wnl  C4 wnl  C3 wnl  CRP wnl    07/2019  ANA Screen-  LDH wnl    07/23/2018 lymph node biopsy:DIAGNOSIS:          Lymph node, deep right neck, excisional biopsy:     -Lymph node with non-specific reactive changes   Wound, AFB and Fungal cultures negative at this time    BONE MARROW 05/03/2019      BONE MARROW, LEFT POSTERIOR ILIAC CREST, CORE BIOPSY, CLOT SECTION,    ASPIRATE SMEAR AND TOUCH PREP:        - NORMOCELLULAR BONE MARROW (approximate50-60%) WITH TRILINEAGE HEMATOPOIESIS,  NORMAL MYELOID TO ERYTHROID RATIO AND ADEQUATE NUMBERS OF    MEGAKARYOCYTES WIT H UNREMARKABLE MORPHOLOGY        - A FEW SMALL REACTIVE LYMPHOID AGGREGATES IDENTIFIED, AND MILDLY  INCREASED  NUMBERS OF EOSINOPHILS        - PENDING CYTOGENETIC STUDIES OF BONE MARROW SAMPLE  - Remain pending as of 05/14/2019.         COMMENT:    The concurrent flow cytometry performed on the bone marrow aspirate  sample shows no evidence of immunophenotypic abnormalities      Imaging Reviewed:    08/16/20  MRI Brain:IMPRESSION:      1. No intracranial mass, hemorrhage, or hydrocephalus is detected.  2. There is normal pattern of enhancement of the brain parenchyma and  meningeal surfaces.    9/18 MRI Abdomen:  IMPRESSION:      1. Diffuse hepatic steatosis. No suspicious focal hepatic lesions.  2. No biliary ductal dilatation or choledocholithiasis.    02/03/20  MRI Pelvis:  IMPRESSION:         Cystic structure at the vaginal cuff, flank by both ovaries not  significantly changed in size compared to 2019. No abnormal internal  enhancement. This favors a benign process such as a peritoneal inclusion  cyst or a paraovarian cyst.    12/14/19 MRI R Hand:  IMPRESSION:      1.  No suspicious soft tissue mass, osseous mass, or masslike  enhancement in the right hand or wrist to correlate with the diffuse FDG  uptake on recent PET/CT.  2.  There is mild enhancing synovitis in the right wrist joint without  active joint erosion or bony destruction.  3.  A small dorsal ganglion cyst overlying the scapholunate interval.    12/06/19  Full Body PET Scan:    Hypermetabolic soft tissue mass in palmar R hand surface at base of the second digit-stable pulmonary nodulke 1.3 cm left lower lobe-recommend follow up Chest CT 3 months         The following sections were reviewed this encounter by the provider:   Tobacco  Allergies  Meds  Problems  Med Hx  Surg Hx  Fam Hx         PMH/PSH:  Past Medical History:   Diagnosis Date    Anxiety     Arrhythmia     WPW ablation in 2005, PVC ablation 8/23    Behcet's disease     She sees Dr. Adin Hector    BMI 45.0-49.9, adult     Fever 11/09/2022    Headache     migraine    Hx of ovarian cyst     Lower back pain     Meningitis 2011    Viral // treated and resolved    Pain in wrist 11/09/2022    Post-operative nausea and  vomiting     Scoliosis     moderate// used a Brace at a young age        Past Surgical History:   Procedure Laterality Date    ABLATION - PVC'S N/A 06/08/2022    Procedure: Ablation - PVC's;  Surgeon: [name], MD;  Location: [facility];  Service: Cardiovascular;  Laterality: N/A;  SAME DAY DISCHARGE  CARTO NO TEE    ARTHROTOMY, WRIST Right 12/03/2020    Procedure: RIGHT UPPER EXTREMITY SYNOVIAL BIOPSY;  Surgeon: Kellie Simmering, MD;  Location: [facility];  Service: Plastics;  Laterality: Right;    BIOPSY, LYMPH NODE N/A 07/23/2018    Procedure: BIOPSY, LYMPH NODE;  Surgeon: Shelda Pal, MD;  Location:  [facility];  Service: ENT;  Laterality: N/A;  NECK DEEP LYMPH NODE BIOPSY    BONE MARROW BIOPSY  2020    led to behcet's dx    [facility] for Wolff-Parkinson-White    CHOLECYSTECTOMY  2009    D&C DIAGNOSTIC  2010    DILATION AND CURETTAGE OF UTERUS  2010    FINGER GANGLION CYST EXCISION  04/2018    GANGLION CYST EXCISION  2014    04/2018 cyst [facility] index finger-[facility]    HERNIA REPAIR  2016    HYSTERECTOMY  2012    HYSTEROSCOPY  2011    indication: Abnormal bleeding    LAPAROSCOPIC, LYSIS, ADHESIONS N/A 11/09/2022    Procedure: LAPAROSCOPIC LYSIS OF ADHESIONS;  Surgeon: Lindell Noe, MD;  Location: [facility];  Service: Gynecology;  Laterality: N/A;    LAPAROSCOPIC, SALPINGECTOMY Left 11/09/2022    Procedure: LAPAROSCOPIC, SALPINGECTOMY LEFT;  Surgeon: Lindell Noe, MD;  Location: [facility];  Service: Gynecology;  Laterality: Left;    LAPAROSCOPY, DIAGNOSTIC N/A 11/09/2022    Procedure: LAPAROSCOPY, DIAGNOSTIC;  Surgeon: Lindell Noe, MD;  Location: [facility];  Service: Gynecology;  Laterality: N/A;  [id number]    UMBILICAL HERNIA REPAIR  2016    UPPER GASTROINTESTINAL ENDOSCOPY  2009    WISDOM TOOTH EXTRACTION  1998        FH/SH:  Family History   Adopted: Yes       Social History     Tobacco Use    Smoking status:  Never    Smokeless tobacco: Never   Vaping Use    Vaping Use: Never used   Substance Use Topics    Alcohol use: Not Currently     Alcohol/week: 0.0 standard drinks of alcohol     Comment: rarely    Drug use: Never        Meds/ Allergies:  No outpatient medications have been marked as taking for the 11/22/22 encounter (Telemedicine Visit) with Mliss Sax, MD.     Allergies   Allergen Reactions    Fruit Blend Flavor [Flavoring Agent] Anaphylaxis and Swelling     Tropical Fruit     Cyanoacrylate     Latex Hives, Swelling, Other (See Comments) and Drug-Induced Flushing    Adhesive [Wound Dressing Adhesive] Itching    Other Itching and Rash     Sutures Monocryl    Oxycodone Hallucinations          PHYSICAL EXAM  General- WNWD, alert and oriented, NAD  Eyes- EOMI, PERRL, no conjunctival injection, no scleral icterus  ENMT- face symmetric without lesions  Skin- no rash, no alopecia  Pulm: No conversational dyspnea, breathing comfortable on room air    +laryngitis, +coughing  .        LABS:  Lab Results   Component Value Date    WBC 12.10 (H) 10/11/2022    HGB 14.1 10/11/2022    HCT 42.2 10/11/2022    MCV 91.9 10/11/2022    PLT 386 (H) 10/11/2022      Lab Results   Component Value Date    CREAT 0.8 10/11/2022    BUN 11.0 10/11/2022    NA 138 10/11/2022    K 4.4 10/11/2022    CL 107 10/11/2022    CO2 22 10/11/2022      Lab Results   Component Value Date    ALT 52 10/11/2022    AST 37 10/11/2022    ALKPHOS 65 10/11/2022    BILITOTAL 0.5 10/11/2022  Lab Results   Component Value Date    ESR 18 08/27/2022    ESR 37 (H) 07/25/2022    ESR 15 01/14/2022    ESR 6 02/23/2021    ESR 7 01/25/2021      Lab Results   Component Value Date    CRP 0.1 08/27/2022    CRP 1 07/25/2022    CRP 1 01/14/2022    CRP <1 02/23/2021    CRP 2 01/25/2021      No results found for: "ANA"   Lab Results   Component Value Date    RHEUMFACTOR <15.0 02/06/2020      Lab Results   Component Value Date    CCP <15.6 02/06/2020     No results found for:  "URICACID"        ASSESSMENT/PLAN:    This patient is a 42 y.o. year female with Past Medical History of Prior Cholecystectomy, Wolf-Parkinson White, Prior Hysterectomy, Fatty Liver Disease who returns for follow up of Behcet's Disease and seronegative rheumatoid arthritis.    Will continue Otezla 30 mg BID, and Humira 40 mg every seven days.  She needs more frequent dosing as she would have recurrent fevers, joint pain and amouth sores prior to her next dose when every 10-14 days.    For now-she will hold next Humira dose given current URI-will give doxycycline for 7 days in case this is atypical pneumonia-she was counseled to go to urgent care if she I not better the next 2-3 days or if symptoms persist or worsen.  Her COVID home testing kits were negative.    Para tubal cyst-s/p surgery with Ob/Gyn-adhesions to bowel walls    Following with Neurology for migraines.    She will see GI for diarrhea, abdominal pain, nausea.      1. Behcet's disease  - Adalimumab 40 MG/0.4ML Pen-injector Kit; Inject 0.4 mLs (40 mg) into the skin once a week Please provide citrate free formulation.  Dispense: 12 each; Refill: 1    2. Productive cough  - doxycycline (VIBRAMYCIN) 100 MG capsule; Please take 1 tablets twice daily until done.  Dispense: 14 capsule; Refill: 0  - Adalimumab 40 MG/0.4ML Pen-injector Kit; Inject 0.4 mLs (40 mg) into the skin once a week Please provide citrate free formulation.  Dispense: 12 each; Refill: 1    3. Atypical pneumonia  - doxycycline (VIBRAMYCIN) 100 MG capsule; Please take 1 tablets twice daily until done.  Dispense: 14 capsule; Refill: 0  - Adalimumab 40 MG/0.4ML Pen-injector Kit; Inject 0.4 mLs (40 mg) into the skin once a week Please provide citrate free formulation.  Dispense: 12 each; Refill: 1    4. Seronegative rheumatoid arthritis  - Adalimumab 40 MG/0.4ML Pen-injector Kit; Inject 0.4 mLs (40 mg) into the skin once a week Please provide citrate free formulation.  Dispense: 12 each;  Refill: 1    5. High risk medication use  - Adalimumab 40 MG/0.4ML Pen-injector Kit; Inject 0.4 mLs (40 mg) into the skin once a week Please provide citrate free formulation.  Dispense: 12 each; Refill: 1                       Total Time spent reviewing records, interviewing patient and coordinating plan of care: 20 min    FOLLOW UP:  Return in about 2 months (around 01/21/2023) for Follow Up, 2-3 months , in person.  Tarry Kos, MD  Rheumatologist  [facility]  [address] [place]  [place], [place] [address]  Phone: [phone]  Fax: [phone]

## 2022-11-23 ENCOUNTER — Observation Stay
Admission: EM | Admit: 2022-11-23 | Discharge: 2022-11-25 | Disposition: A | Discharge status: Home / Self Care | Payer: BLUE CROSS/BLUE SHIELD | Attending: Internal Medicine | Admitting: Internal Medicine

## 2022-11-23 ENCOUNTER — Emergency Department: Payer: BLUE CROSS/BLUE SHIELD

## 2022-11-23 ENCOUNTER — Encounter (INDEPENDENT_AMBULATORY_CARE_PROVIDER_SITE_OTHER): Payer: Self-pay | Admitting: Obstetrics and Gynecology

## 2022-11-23 ENCOUNTER — Ambulatory Visit (INDEPENDENT_AMBULATORY_CARE_PROVIDER_SITE_OTHER): Payer: BLUE CROSS/BLUE SHIELD | Admitting: Obstetrics and Gynecology

## 2022-11-23 ENCOUNTER — Ambulatory Visit (INDEPENDENT_AMBULATORY_CARE_PROVIDER_SITE_OTHER): Payer: BLUE CROSS/BLUE SHIELD | Admitting: Family

## 2022-11-23 ENCOUNTER — Encounter (INDEPENDENT_AMBULATORY_CARE_PROVIDER_SITE_OTHER): Payer: Self-pay | Admitting: Family

## 2022-11-23 VITALS — BP 135/89 | HR 113 | Temp 98.8°F | Ht 68.0 in | Wt 312.6 lb

## 2022-11-23 VITALS — BP 147/90 | HR 113 | Temp 99.5°F | Resp 16 | Ht 68.0 in | Wt 312.0 lb

## 2022-11-23 DIAGNOSIS — K811 Chronic cholecystitis: Secondary | ICD-10-CM

## 2022-11-23 DIAGNOSIS — B349 Viral infection, unspecified: Principal | ICD-10-CM

## 2022-11-23 DIAGNOSIS — R000 Tachycardia, unspecified: Secondary | ICD-10-CM

## 2022-11-23 DIAGNOSIS — A419 Sepsis, unspecified organism: Secondary | ICD-10-CM

## 2022-11-23 DIAGNOSIS — R0602 Shortness of breath: Secondary | ICD-10-CM

## 2022-11-23 DIAGNOSIS — Z4889 Encounter for other specified surgical aftercare: Secondary | ICD-10-CM

## 2022-11-23 DIAGNOSIS — Z8679 Personal history of other diseases of the circulatory system: Secondary | ICD-10-CM | POA: Insufficient documentation

## 2022-11-23 DIAGNOSIS — Z6841 Body Mass Index (BMI) 40.0 and over, adult: Secondary | ICD-10-CM | POA: Insufficient documentation

## 2022-11-23 DIAGNOSIS — M352 Behcet's disease: Secondary | ICD-10-CM | POA: Insufficient documentation

## 2022-11-23 DIAGNOSIS — R7989 Other specified abnormal findings of blood chemistry: Secondary | ICD-10-CM | POA: Insufficient documentation

## 2022-11-23 DIAGNOSIS — E872 Acidosis, unspecified: Secondary | ICD-10-CM | POA: Insufficient documentation

## 2022-11-23 DIAGNOSIS — D84821 Immunodeficiency due to drugs: Secondary | ICD-10-CM | POA: Insufficient documentation

## 2022-11-23 DIAGNOSIS — I456 Pre-excitation syndrome: Secondary | ICD-10-CM | POA: Insufficient documentation

## 2022-11-23 DIAGNOSIS — Z7960 Long term (current) use of unspecified immunomodulators and immunosuppressants: Secondary | ICD-10-CM | POA: Insufficient documentation

## 2022-11-23 DIAGNOSIS — Z9889 Other specified postprocedural states: Secondary | ICD-10-CM | POA: Insufficient documentation

## 2022-11-23 DIAGNOSIS — J329 Chronic sinusitis, unspecified: Secondary | ICD-10-CM | POA: Insufficient documentation

## 2022-11-23 LAB — COMPREHENSIVE METABOLIC PANEL
ALT: 51 U/L (ref 0–55)
AST (SGOT): 36 U/L (ref 5–41)
Albumin/Globulin Ratio: 1.3 (ref 0.9–2.2)
Albumin: 4.3 g/dL (ref 3.5–5.0)
Alkaline Phosphatase: 80 U/L (ref 37–117)
Anion Gap: 9 (ref 5.0–15.0)
BUN: 9 mg/dL (ref 7.0–21.0)
Bilirubin, Total: 0.6 mg/dL (ref 0.2–1.2)
CO2: 21 mEq/L (ref 17–29)
Calcium: 9.8 mg/dL (ref 8.5–10.5)
Chloride: 106 mEq/L (ref 99–111)
Creatinine: 0.7 mg/dL (ref 0.4–1.0)
Globulin: 3.4 g/dL (ref 2.0–3.6)
Glucose: 97 mg/dL (ref 70–100)
Potassium: 4.1 mEq/L (ref 3.5–5.3)
Protein, Total: 7.7 g/dL (ref 6.0–8.3)
Sodium: 136 mEq/L (ref 135–145)
eGFR: >60 mL/min/{1.73_m2} (ref >=60)

## 2022-11-23 LAB — CBC AND DIFFERENTIAL
Absolute NRBC: 0 10*3/uL (ref 0.00–0.00)
Basophils Absolute Automated: 0.04 10*3/uL (ref 0.00–0.08)
Basophils Automated: 0.4 %
Eosinophils Absolute Automated: 0.07 10*3/uL (ref 0.00–0.44)
Eosinophils Automated: 0.6 %
Hematocrit: 41.9 % (ref 34.7–43.7)
Hgb: 14.4 g/dL (ref 11.4–14.8)
Immature Granulocytes Absolute: 0.04 10*3/uL (ref 0.00–0.07)
Immature Granulocytes: 0.4 %
Instrument Absolute Neutrophil Count: 7.91 10*3/uL — ABNORMAL HIGH (ref 1.10–6.33)
Lymphocytes Absolute Automated: 1.96 10*3/uL (ref 0.42–3.22)
Lymphocytes Automated: 18.2 %
MCH: 31.5 pg (ref 25.1–33.5)
MCHC: 34.4 g/dL (ref 31.5–35.8)
MCV: 91.7 fL (ref 78.0–96.0)
MPV: 8.5 fL — ABNORMAL LOW (ref 8.9–12.5)
Monocytes Absolute Automated: 0.76 10*3/uL (ref 0.21–0.85)
Monocytes: 7.1 %
Neutrophils Absolute: 7.91 10*3/uL — ABNORMAL HIGH (ref 1.10–6.33)
Neutrophils: 73.3 %
Nucleated RBC: 0 /100 WBC (ref 0.0–0.0)
Platelets: 402 10*3/uL — ABNORMAL HIGH (ref 142–346)
RBC: 4.57 10*6/uL (ref 3.90–5.10)
RDW: 13 % (ref 11–15)
WBC: 10.78 10*3/uL — ABNORMAL HIGH (ref 3.10–9.50)

## 2022-11-23 LAB — POCT PREGNANCY TEST, URINE HCG: POCT Pregnancy HCG Test, UR: NEGATIVE

## 2022-11-23 LAB — COVID-19 (SARS-COV-2) & INFLUENZA  A/B, NAA (ROCHE LIAT)
Influenza A: NOT DETECTED
Influenza B: NOT DETECTED
SARS-CoV-2 Overall Result: NOT DETECTED

## 2022-11-23 LAB — URINALYSIS WITH REFLEX TO MICROSCOPIC EXAM - REFLEX TO CULTURE
Bilirubin, UA: NEGATIVE
Blood, UA: NEGATIVE
Glucose, UA: NEGATIVE
Leukocyte Esterase, UA: NEGATIVE
Nitrite, UA: NEGATIVE
Protein, UR: NEGATIVE
Specific Gravity UA: 1.014 (ref 1.001–1.035)
Urine pH: 5.5 (ref 5.0–8.0)
Urobilinogen, UA: NORMAL mg/dL

## 2022-11-23 LAB — ECG 12-LEAD
Atrial Rate: 115 {beats}/min
P Axis: 27 degrees
P-R Interval: 168 ms
Q-T Interval: 322 ms
QRS Duration: 70 ms
QTC Calculation (Bezet): 445 ms
R Axis: 47 degrees
T Axis: 57 degrees
Ventricular Rate: 115 {beats}/min

## 2022-11-23 LAB — GROUP A STREP, RAPID ANTIGEN: Group A Strep, Rapid Antigen: NEGATIVE

## 2022-11-23 LAB — LACTIC ACID
Lactic Acid: 2.2 mmol/L — ABNORMAL HIGH (ref 0.2–2.0)
Lactic Acid: 3 mmol/L — ABNORMAL HIGH (ref 0.2–2.0)
Lactic Acid: 2.2 mmol/L — ABNORMAL HIGH (ref 0.2–2.0)
Lactic Acid: 1.7 mmol/L (ref 0.2–2.0)

## 2022-11-23 MED ORDER — COLESEVELAM HCL 625 MG PO TABS
625.0000 mg | ORAL_TABLET | Freq: Two times a day (BID) | ORAL | Status: DC
Start: 2022-11-24 — End: 2022-11-25
  Administered 2022-11-24 – 2022-11-25 (×2): 625 mg via ORAL
  Filled 2022-11-23 (×6): qty 1

## 2022-11-23 MED ORDER — PATIENT SUPPLIED NON FORMULARY
1.0000 g | Freq: Every morning | Status: DC
Start: 2022-11-24 — End: 2022-11-25

## 2022-11-23 MED ORDER — DEXTROSE 50 % IV SOLN
12.5000 g | INTRAVENOUS | Status: DC | PRN
Start: 2022-11-23 — End: 2022-11-25

## 2022-11-23 MED ORDER — ENOXAPARIN SODIUM 80 MG/0.8ML IJ SOSY
0.5000 mg/kg | PREFILLED_SYRINGE | Freq: Every day | INTRAMUSCULAR | Status: DC
Start: 2022-11-24 — End: 2022-11-25
  Administered 2022-11-24: 70 mg via SUBCUTANEOUS
  Filled 2022-11-23 (×2): qty 0.8

## 2022-11-23 MED ORDER — KETOROLAC TROMETHAMINE 30 MG/ML IJ SOLN
30.0000 mg | Freq: Once | INTRAMUSCULAR | Status: AC
Start: 2022-11-23 — End: 2022-11-23
  Administered 2022-11-23: 30 mg via INTRAVENOUS
  Filled 2022-11-23: qty 1

## 2022-11-23 MED ORDER — GLUCOSE 40 % PO GEL (WRAP)
15.0000 g | ORAL | Status: DC | PRN
Start: 2022-11-23 — End: 2022-11-25

## 2022-11-23 MED ORDER — SODIUM CHLORIDE 0.9 % IV BOLUS
1000.0000 mL | Freq: Once | INTRAVENOUS | Status: AC
Start: 2022-11-23 — End: 2022-11-23
  Administered 2022-11-23: 1000 mL via INTRAVENOUS

## 2022-11-23 MED ORDER — HYDROXYZINE HCL 10 MG PO TABS
10.0000 mg | ORAL_TABLET | Freq: Every evening | ORAL | Status: DC
Start: 2022-11-24 — End: 2022-11-25
  Administered 2022-11-24 (×2): 10 mg via ORAL
  Filled 2022-11-23 (×3): qty 1

## 2022-11-23 MED ORDER — SODIUM CHLORIDE 0.9 % IV MBP
4.5000 g | Freq: Four times a day (QID) | INTRAVENOUS | Status: DC
Start: 2022-11-24 — End: 2022-11-25
  Administered 2022-11-24 – 2022-11-25 (×5): 4.5 g via INTRAVENOUS
  Filled 2022-11-23 (×7): qty 20

## 2022-11-23 MED ORDER — STERILE WATER FOR INJECTION IJ/IV SOLN (WRAP)
4.5000 g | Freq: Once | INTRAVENOUS | Status: AC
Start: 2022-11-23 — End: 2022-11-23
  Administered 2022-11-23: 4.5 g via INTRAVENOUS
  Filled 2022-11-23: qty 20

## 2022-11-23 MED ORDER — SODIUM CHLORIDE 0.9 % IV BOLUS
2000.0000 mL | Freq: Once | INTRAVENOUS | Status: AC
Start: 2022-11-23 — End: 2022-11-23
  Administered 2022-11-23: 2000 mL via INTRAVENOUS

## 2022-11-23 MED ORDER — DEXAMETHASONE SODIUM PHOSPHATE 4 MG/ML IJ SOLN (WRAP)
10.0000 mg | Freq: Once | INTRAMUSCULAR | Status: AC
Start: 2022-11-23 — End: 2022-11-23
  Administered 2022-11-23: 10 mg via INTRAVENOUS
  Filled 2022-11-23 (×2): qty 3

## 2022-11-23 MED ORDER — GLUCAGON 1 MG IJ SOLR (WRAP)
1.0000 mg | INTRAMUSCULAR | Status: DC | PRN
Start: 2022-11-23 — End: 2022-11-25

## 2022-11-23 MED ORDER — ACETAMINOPHEN 500 MG PO TABS
1000.0000 mg | ORAL_TABLET | Freq: Once | ORAL | Status: AC
Start: 2022-11-23 — End: 2022-11-23
  Administered 2022-11-23: 1000 mg via ORAL
  Filled 2022-11-23: qty 2

## 2022-11-23 MED ORDER — TAMSULOSIN HCL 0.4 MG PO CAPS
0.4000 mg | ORAL_CAPSULE | Freq: Every day | ORAL | 0 refills | Status: DC
Start: 2022-11-23 — End: 2022-11-23

## 2022-11-23 MED ORDER — BUSPIRONE HCL 5 MG PO TABS
5.0000 mg | ORAL_TABLET | Freq: Three times a day (TID) | ORAL | Status: DC | PRN
Start: 2022-11-23 — End: 2022-11-25
  Filled 2022-11-23 (×3): qty 1

## 2022-11-23 MED ORDER — CLINDAMYCIN PHOSPHATE 1 % EX SOLN
Freq: Every morning | CUTANEOUS | Status: DC
Start: 2022-11-24 — End: 2022-11-25
  Filled 2022-11-23 (×2): qty 60

## 2022-11-23 MED ORDER — DEXTROSE 10 % IV BOLUS
12.5000 g | INTRAVENOUS | Status: DC | PRN
Start: 2022-11-23 — End: 2022-11-25

## 2022-11-23 MED ORDER — APREMILAST 30 MG PO TABS
30.0000 mg | ORAL_TABLET | Freq: Two times a day (BID) | ORAL | Status: DC
Start: 2022-11-23 — End: 2022-11-25
  Administered 2022-11-24: 30 mg via ORAL
  Filled 2022-11-23 (×4): qty 1

## 2022-11-23 NOTE — ED Provider Notes (Signed)
[facility] EMERGENCY DEPARTMENT  ATTENDING PHYSICIAN HISTORY AND PHYSICAL EXAM     Patient Name: Hailey [name], Hailey [name]  Department:[facility]  Encounter Date:  11/23/2022  Attending Physician: Luther Hearing, MD   Age: 42 y.o. female  Patient Room: N 37/N 37  PCP: Loletha Grayer, MD           Diagnosis/Disposition:     Final diagnoses:   Viral syndrome   Sepsis, due to unspecified organism, unspecified whether acute organ dysfunction present       ED Disposition       ED Disposition   Observation    Condition   --    Date/Time   Wed Nov 23, 2022  8:03 PM    Comment   Admitting Physician: Ellwood Handler [[id number]]   Service:: Medicine [[id number]]   Estimated Length of Stay: < 2 midnights   Tentative Discharge Plan?: Home or Self Care [1]   Does patient need telemetry?: Yes   Is patient 18 yrs or greater?: Yes   Telemetry type (separate Telemetry order is also required):: Adult telemetry   Was admission to another facility considered?: No   Detail:: [facility] resource required                 Follow-Up Providers (if applicable)    No follow-up provider specified.     Current Discharge Medication List              Medical Decision Making:   {THIS REVIEW SECTION WILL AUTODELETE ONCE NOTE IS SIGNED  Action Links   Snapshot  Orders  Decision Support  Secure Chat  Transfer Documents  Patient Care Timeline  Workup  Dispo  END REVIEW SECTION(Optional):55325}  Initial Differential Diagnosis:  Initial differential diagnosis to include but not limited to: {GLO:75643}    Plan:  ***           Medical Decision Making  Problems Addressed:  Sepsis, due to unspecified organism, unspecified whether acute organ dysfunction present: acute illness or injury  Viral syndrome: acute illness or injury    Amount and/or Complexity of Data Reviewed  Labs: ordered.  Radiology: ordered.    Risk  Prescription drug management.      {Records Reviewed (internal and external)? (Optional):61520::"N/A"}  {Was management discussed with a  consultant? (Optional):61519::"N/A"}  {Diagnostic test considered and not performed (Optional):61521::"N/A"}  {Prescription medications considered and not given (Optional):61522::"N/A"}  {Was the decision around the need for surgery discussed with a consultant? (Optional):61527::"N/A"}  {Social Determinants of Health Considerations (Optional):61524::"N/A"}  {Was there decision to not resuscitate or to de-escalate care due to poor prognosis? (Optional):61525::"N/A"}        History of Presenting Illness:   {THIS REVIEW SECTION WILL AUTODELETE ONCE NOTE IS SIGNED  History Review Links   Snapshot  Problem List  Care Everywhere  PMH  [place]  FamHx  SocHx  Allergies  Meds  Immunizations  END REVIEW SECTION(Optional):55325}  Nursing Triage note: Pt to ED d/t HA, intermittent fevers, difficulty swallowing/hoarse voice, chest pain/tightness x1 wk. Currently taking Doxy, but did not take her dose today. Pt had laparoscopic surgery ~2 weeks ago. Pt sent to ED to r/o PE. Pt is immunocompromised at baseline. Awake and alert. Skin hot to the touch. Has some trouble speaking d/t the throat discomfort. No respiratory distress noted.  Chief complaint: Shortness of Breath and Fever    Hailey [name] is a 42 y.o. female with pmhx of meningitis, anxiety, Behcet's disease who presents with moderate persistent  sore throat, HA and rhinorrhea for the last week. Per prior chart review pt had a diagnostic laparoscopy, left salpingectomy, and lysis of adhesions on 1/3. Since then pt states she had a telemedicine visit with Rheumatology yesterday who prescribed her doxycycline out of concern for pneumonia. Today pt states she spoke with her surgeon who referred her to an Urgent Care where she was then referred to the ED out of concern for PE. Pt states her home covid test was negative.            Review of Systems:  Physical Exam:     Review of Systems  All other systems reviewed and are negative.    Pulse (!) 114  BP (!)  157/101  Resp 16  SpO2 96 %  Temp 100.1 F (37.8 C)     Physical Exam  Constitutional:       Appearance: She is well-developed.   HENT:      Head: Normocephalic and atraumatic.      Mouth/Throat:      Comments: Tonsils with erythema, no swelling.   Eyes:      Conjunctiva/sclera: Conjunctivae normal.      Pupils: Pupils are equal, round, and reactive to light.   Cardiovascular:      Rate and Rhythm: Regular rhythm. Tachycardia present.      Heart sounds: Normal heart sounds.   Pulmonary:      Effort: Pulmonary effort is normal.      Breath sounds: Normal breath sounds.   Abdominal:      General: Bowel sounds are normal.      Palpations: Abdomen is soft.   Musculoskeletal:         General: Normal range of motion.      Cervical back: Normal range of motion and neck supple.   Skin:     General: Skin is warm and dry.   Neurological:      Mental Status: She is alert and oriented to person, place, and time.              Interpretations, Clinical Decision Tools and Critical Care:   {THIS REVIEW SECTION WILL AUTODELETE ONCE NOTE IS SIGNED  Interpretation/Data Review Links   Snapshot  Patient Care Timeline  Results Review  EKG  Labs  Imaging  Media  Encounter Orders   END REVIEW SECTION(Optional):55325}  O2 Sat:  The patient's oxygen saturation was 96 % on room air. This was independently interpreted by me as Normal.     EKG: I reviewed and Independently interpreted the patient's EKG as Sinus tachycardia at 115 with axis, intervals and segments that are normal for age.     Cardiac Monitoring: I independely reviewed and interpreted the patient's rhythm strip as sinus tachycardia at 101.    Critical Care Time(not including procedures): *** minutes.  Due to the high risk of critical illness or multi-organ failure at initial presentation and/or during ED course.   System(s) at risk for compromise:  circulatory, respiratory, and metabolic  Critical Diagnosis:   1. Viral syndrome    2. Sepsis, due to unspecified  organism, unspecified whether acute organ dysfunction present       The patient was Hypotensive:   No    The patient was Hypoxic:   No    This does not including time spent performing other reported procedures or services.  Critical care time involved full attention to the patient's condition and included:   Review of nursing notes and/or old charts -  Yes  Documentation time - Yes  Care, transfer of care, and discharge plans - Yes  Obtaining necessary history from family, EMS, nursing home staff and/or treating physicians - Yes  Review of medications, allergies, and vital signs - Yes   Consultant collaboration on findings and treatment options - Yes  Ordering, interpreting, and reviewing diagnostic studies/tab tests - Yes                Procedures:   Procedures      Attestations:     Scribe Attestation: I am the first provider for this patient and I personally performed the services documented. A. Hollie Beach is scribing for me on this chart. This note and the patient instructions accurately reflect work and decisions made by me.      Documentation Notes:  Parts of this note were generated by the Epic EMR system/ Dragon speech recognition and may contain inherent errors or omissions not intended by the user. Grammatical errors, random word insertions, deletions, pronoun errors and incomplete sentences are occasional consequences of this technology due to software limitations. Not all errors are caught or corrected.  My documentation is often completed after the patient is no longer under my clinical care. In some cases, the Epic EMR may pull updated results into the above documentation which may not reflect all results or information that were available to me at the time of my medical decision making.   If there are questions or concerns about the content of this note or information contained within the body of this dictation they should be addressed directly with the author for clarification.

## 2022-11-23 NOTE — ED Notes (Signed)
[facility] EMERGENCY DEPT  ED NURSING NOTE FOR THE RECEIVING INPATIENT NURSE   ED NURSE nicole   [place] [id number]   ED CHARGE RN Kathlee Nations   ADMISSION INFORMATION   Hailey [name] is a 42 y.o. female admitted with an ED diagnosis of:    1. Viral syndrome    2. Sepsis, due to unspecified organism, unspecified whether acute organ dysfunction present         Isolation: None   Allergies: Fruit blend flavor [flavoring agent], Cyanoacrylate, Latex, Adhesive [wound dressing adhesive], Other, and Oxycodone   Holding Orders confirmed? N/A   Belongings Documented? No   Home medications sent to pharmacy confirmed? N/A   NURSING CARE   Patient Comes From:   Mental Status: Home Independent  alert and oriented   ADL: Independent with all ADLs   Ambulation: no difficulty   Pertinent Information  and Safety Concerns:     [place] Violence Risk Level: Low Pt to ED d/t HA, intermittent fevers, difficulty swallowing/hoarse voice, chest pain/tightness x1 wk. Currently taking Doxy, but did not take her dose today. Pt had laparoscopic surgery ~2 weeks ago. Pt sent to ED to r/o PE. Pt is immunocompromised at baseline. Awake and alert. Skin hot to the touch. Has some trouble speaking d/t the throat discomfort. No respiratory distress noted.      CT / NIH   CT Head ordered on this patient?  N/A   NIH/Dysphagia assessment done prior to admission? N/A   VITAL SIGNS (at the time of this note)      Vitals:    11/23/22 1800   BP: 145/87   Pulse: 97   Resp: 19   Temp:    SpO2: 96%

## 2022-11-23 NOTE — Progress Notes (Signed)
CC: postoperative visit    HPI: Hailey [name] is a 42 y.o. who presents to the [facility] for routine postoperative visit on 11/23/2022    Patient underwent the following procedure on this date: diagnostic laparoscopy, left salpingectomy, lysis of adhesions    Indications for the procedure: lower abdominal pain    Operative Findings: left 5 cm paratubal cyst    Pathology showed: benign cyst and fallopian tube    Since surgery patient reports  Abdominal pain: no  Incisional pain and drainage: when coughing  Problems with bowel/bladder function: no  Vaginal bleeding/discharge: no    Review of Systems -- Patient having upper respiratory symptoms, sore throat, cough, swollen lymph nodes, fever of 102. Afebrile currently but feeling overall terrible.     Past medical, surgical, family and social histories were reviewed, and Epic was updated accordingly.     Allergies   Allergen Reactions    Fruit Blend Flavor [Flavoring Agent] Anaphylaxis and Swelling     Tropical Fruit     Cyanoacrylate     Latex Hives, Swelling, Other (See Comments) and Drug-Induced Flushing    Adhesive [Wound Dressing Adhesive] Itching    Other Itching and Rash     Sutures Monocryl    Oxycodone Hallucinations       PHYSICAL EXAM  BP 135/89   Pulse (!) 113   Temp 98.8 F (37.1 C)   Ht 1.727 m (5\' 8" )   Wt 141.8 kg (312 lb 9.8 oz)   LMP  (LMP Unknown)   BMI 47.53 kg/m   General: ill appearing, patient with possible pneumonia   Neck: lymph nodes swollen  Abdomen: incisions well healing, soft to palpation, no rebound/guarding  Pelvic: not indicated     Assessment   A/P: Hailey [name] is a 42 y.o. who is approximately 2 weeks postop. Patient overall feeling well from a surgical standpoint and feels pain relief from prior to surgery.   Patient is doing well and had a normal postoperative check today.   Recommended abdominal binder for relief while coughing.  Patient going to urgent care to address URI.    Patient was reminded to maintain lifting restrictions for 2 more weeks.  Operative and pathology reports were reviewed at today's visit.     Lindell Noe, MD,

## 2022-11-23 NOTE — Progress Notes (Signed)
4 eyes in 4 hours pressure injury assessment note:      Completed with: Clin Tech Lai  Unit & Time admitted: [facility] 5 at 2200h            Bony Prominences: Check appropriate box; if wound is present enter wound assessment in LDA     Occiput:                 [x] WNL  []  Wound present  Face:                     [x] WNL  []  Wound present  Ears:                      [x] WNL  []  Wound present  Spine:                    [x] WNL  []  Wound present  Shoulders:             [x] WNL  []  Wound present  Elbows:                  [x] WNL  []  Wound present  Sacrum/coccyx:     [x] WNL  []  Wound present  Ischial Tuberosity:  [x] WNL  []  Wound present  Trochanter/Hip:      [x] WNL  []  Wound present  Knees:                   [x] WNL  []  Wound present  Ankles:                   [x] WNL  []  Wound present  Heels:                    [x] WNL  []  Wound present  Other pressure areas:  []  Wound location       Device related: []  Device name:     If wound is present, please describe here:         LDA completed if wound present: yes/no  Consult WOCN if necessary    Other skin related issues, ie tears, rash, etc, document in Integumentary flowsheet

## 2022-11-23 NOTE — Plan of Care (Signed)
Admission note:  Pt arrived to floor via stretcher from ED around 2200H.  VSS. Placed on tele, denies of chest pain.  Admitted to Dr. Clementeen Graham and admission orders in place. Denies pain at this time Pt arrived to floor with personal belongings eyeglasses, cellphone, pants, shirt, footwear, jacket, bra and underpants.  Oriented to unit. Whiteboard updated. Pt updated on plan of care.    Neuro: alert and oriented x4  Cardiac/Tele: on tele, denies chest pain  Pulm: on room air, no SOB  Skin: intact  GI/GU/Last BM: continent x2  Mobility: independent  Active Lines/Drains: Piv on the right Ac  Pain: complains of sore throat

## 2022-11-23 NOTE — Patient Instructions (Signed)
Please go to  ER to R/O PE

## 2022-11-23 NOTE — ED Triage Notes (Shared)
Patient to ED c/o    Per patient     Patient currently denies pain, recent travel/sick contacts, fever/chills, HA, light headed/dizzy, vision changes, CP/SOB, numbness/tingling in extremities, n/v/d, abdomen pain, urinary/stool issues, balance issues, or injury.     Patient A&Ox4, GCS 15, respirations regular and pt able to speak in full clear sentences, skin warm and dry.'

## 2022-11-23 NOTE — Progress Notes (Signed)
[facility]  CARE  PROGRESS NOTE     Patient: Hailey [name]   Date: 11/23/2022   MRN: [id number]       Hailey [name] is a 42 y.o. female      HISTORY     History obtained from: Patient    Chief Complaint   Patient presents with    URI     Pt states that she feels like she isn't getting enough air and her doctor is worried about walking pneumonia        HPI     History: A 42 y.o.f with MH  of Behcet's disease presented with c/o c/o cough and fatigue since Thu and unable to get enough air and pcp worried about PNA.Home covid test was neg.   She had lap ovarian cystectomy with lysis and adhesions on 10/18/22.  tube surgery.   Denies chest pain, otalgia, abd pain or N/v.   Location:see history  Quality:see history  Severity:mild  Duration:see history  Timing:constant  Context /Modifying Factors: see history  Associated symptoms:see history          Review of Systems  ROS all other negative except pertinent positives in HPI    History:    Pertinent Past Medical, Surgical, Family and Social History were reviewed.        Current Outpatient Medications:     Adalimumab 40 MG/0.4ML Pen-injector Kit, Inject 0.4 mLs (40 mg) into the skin once a week Please provide citrate free formulation. (Patient not taking: Reported on 11/23/2022), Disp: 12 each, Rfl: 1    Apremilast (Otezla) 30 MG Tab, Take 1 tablet (30 mg) by mouth 2 (two) times daily, Disp: 180 tablet, Rfl: 1    Azelastine-Fluticasone 137-50 MCG/ACT Suspension, 1 spray by Nasal route 2 (two) times daily, Disp: , Rfl:     botulinum Toxin Type A (Botox) 200 units Recon Soln injection, Inject 155 units IM every 12 weeks, Disp: 1 each, Rfl: 3    busPIRone (BUSPAR) 5 MG tablet, TAKE 1 TABLET (5 MG) BY MOUTH 3 (THREE) TIMES DAILY AS NEEDED (ANXIETY), Disp: 270 tablet, Rfl: 0    clindamycin (CLEOCIN T) 1 % lotion, Apply topically every morning, Disp: , Rfl:     colesevelam (WELCHOL) 625 MG tablet, Take 1 tablet (625 mg) by mouth 2 (two) times daily with meals  (Patient taking differently: Take 2 tablets (1,250 mg) by mouth every morning), Disp: 180 tablet, Rfl: 3    cyclobenzaprine (FLEXERIL) 5 MG tablet, TAKE 1 TABLET FOR MUSCLE SPASMS AT NIGHT AS NEEDED, Disp: 30 tablet, Rfl: 0    Dapsone 7.5 % Gel, Apply topically every morning, Disp: , Rfl:     doxycycline (VIBRAMYCIN) 100 MG capsule, Please take 1 tablets twice daily until done., Disp: 14 capsule, Rfl: 0    EPINEPHrine (EPIPEN 2-PAK) 0.3 MG/0.3ML Solution Auto-injector injection, Inject 0.3 mLs (0.3 mg) into the muscle once as needed (Anaphylaxis) ; go to ER after injection (Patient not taking: Reported on 11/23/2022), Disp: 1 each, Rfl: 1    hydrOXYzine (ATARAX) 10 MG tablet, Take 1 tablet (10 mg) by mouth nightly, Disp: 90 tablet, Rfl: 3    ibuprofen (ADVIL) 200 MG tablet, Take 3 tablets (600 mg) by mouth every 6 (six) hours as needed for Pain, Disp: 30 tablet, Rfl: 1    Jublia 10 % Solution, , Disp: , Rfl:     lidocaine-prilocaine (EMLA) cream, Apply topically as needed Dermatology procedures, Disp: , Rfl:  Multiple Vitamins-Minerals (MULTIVITAMIN ADULT PO), Take by mouth nightly (Patient not taking: Reported on 11/23/2022), Disp: , Rfl:     Omega-3 1000 MG Cap, Take by mouth nightly (Patient not taking: Reported on 11/23/2022), Disp: , Rfl:     ondansetron (ZOFRAN-ODT) 4 MG disintegrating tablet, TAKE 1 TABLET (4 MG) BY MOUTH EVERY 8 (EIGHT) HOURS AS NEEDED FOR NAUSEA, Disp: 30 tablet, Rfl: 2    Rimegepant Sulfate 75 MG Tablet Dispersible, Take 1 tablet (75 mg) by mouth every other day For migraine prevention, Disp: 16 tablet, Rfl: 11    rizatriptan (MAXALT) 10 MG tablet, Take 1-2 tablets (10-20 mg total) by mouth once as needed for Migraine Max 3 days a week, Disp: 36 tablet, Rfl: 3    valACYclovir (VALTREX) 1000 MG tablet, Take 2 tabs at onset; repeat once in 12 hours.  Total 4 tabs per outbreak, Disp: 12 tablet, Rfl: 3    vitamin D (CHOLECALCIFEROL) 25 MCG (1000 UT) tablet, Take 1 tablet (1,000 Units) by mouth  nightly (Patient not taking: Reported on 11/23/2022), Disp: , Rfl:     Allergies   Allergen Reactions    Fruit Blend Flavor [Flavoring Agent] Anaphylaxis and Swelling     Tropical Fruit     Cyanoacrylate     Latex Hives, Swelling, Other (See Comments) and Drug-Induced Flushing    Adhesive [Wound Dressing Adhesive] Itching    Other Itching and Rash     Sutures Monocryl    Oxycodone Hallucinations       Medications and Allergies reviewed.    PHYSICAL EXAM     Vitals:    11/23/22 1242   BP: 147/90   Pulse: (!) 113   Resp: 16   Temp: 99.5 F (37.5 C)   TempSrc: Tympanic   SpO2: 97%   Weight: 141.5 kg (312 lb)   Height: 1.727 m (5\' 8" )       Physical Exam  Constitutional:       General: She is not in acute distress.     Appearance: She is well-developed.      Comments: Low grade fever    HENT:      Head: Normocephalic and atraumatic.      Mouth/Throat:      Comments: Posterior pharynx with erythema   Bilateral cervical lymph node enlargement    Eyes: Conjunctivae are normal. Cardiovascular:      Rate and Rhythm: Regular rhythm. Tachycardia present.   Pulmonary:      Effort: Pulmonary effort is normal.      Breath sounds: Normal breath sounds.   Neurological:      Mental Status: She is alert and oriented to person, place, and time.   Skin:     General: Skin is warm.   Vitals and nursing note reviewed.         UCC COURSE     There were no labs reviewed with this patient during the visit.    There were no x-rays reviewed with this patient during the visit.    No current facility-administered medications for this visit.       PROCEDURES     Procedures    MEDICAL DECISION MAKING     History, physical, labs/studies most consistent with      SOB              as the diagnosis.  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 Lauro Regulus    BMI 45.0-49.9, adult     Fever 11/09/2022    Headache     migraine    Hx of ovarian cyst     Lower  back pain     Meningitis 2011    Viral // treated and resolved    Pain in wrist 11/09/2022    Post-operative nausea and vomiting     Scoliosis     moderate// used a Brace at a young age       Chart Review:  Prior PCP, Specialist and/or ED notes reviewed today: yes  Prior labs/images/studies reviewed today: Yes    Differential Diagnosis: Sinusitis, Bronchitis, Pharyngitis, Pneumonia, Influenza, COVID-19 and Allergic Rhinitis        ASSESSMENT     Encounter Diagnosis   Name Primary?    SOB (shortness of breath) Yes            PLAN      PLAN:   A 42 y.o.f presented with c/o  SOB and cough that started on Sat and worsen yesterday  She had lap ovarian cystectomy with lysis and adhesions on 10/18/22. Per pt she had surgery on 11/09/2022. I do not see the documentation in the chart.   Ambulatory stat 97%  Her rheumatology placed her on Doxy starting yesterday.   PERC rule could not used on this pt.   Advised pt to go to ER to R/O PE   [facility] ER comm nurse notified at 1315  All pt's questions answered. D/c instruction reviewed with pt. Pt acknowledged understanding.         Orders Placed This Encounter   Procedures    AMB REFERRAL TO EMERGENCY DEPARTMENT (AKA ED)     Requested Prescriptions      No prescriptions requested or ordered in this encounter       Discussed results and diagnosis with patient/family.  Reviewed warning signs for worsening condition, as well as, indications for follow-up with primary care physician and return to urgent care clinic.   Patient/family expressed understanding of instructions.     An After Visit Summary was provided to the patient.

## 2022-11-23 NOTE — ED Notes (Signed)
I have briefly evaluated this patient as triage physician to initiate ordering of diagnostic studies necessary to expedite care. I am not the primary provider for this patient. Please refer to the provider/reassessment/disposition notes for further information about this patient's emergency department course.    For PE rule out but sounds more infectious.  Patient tachycardic, borderline febrile, has infectious symptoms.  Ordered infectious workup and CTA

## 2022-11-23 NOTE — H&P (Addendum)
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 and recent surgical procedure for removal of ovarian cyst with left salpingectomy and lysis of adhesions on November 09, 2022 presents with intermittent fever associated with sore throat, nonproductive cough, shortness of breath chest pain found to have sinus tachycardia and chest x-ray with no acute process along with lactic acidosis and leukocytosis with negative COVID, flu and strep    Plan:   #Viral syndrome  #lactic acidosis  - WBC 10.78, lactic acid 2.2 --> 3.0 --> 2.2  - UA trace ketones  - f/u blood cx  - throat cx pending, rapid strep negative  - flu and covid negative  - ECG HR 115, sinus tachycardia  -CXR no acute process  -CTA chest rule out PE pending  -CT abdomen pelvis rule out infection pending  - given dexamethasone 10mg  IV, ketorolac 30mg  once, zosyn 4.5 gram once, 2L NS  - continue zosyn until abdominal infection r/o  - tele monitor    #Ovarian cyst status post laparoscopic appendectomy with left salpingectomy and lysis of adhesions  - 11/09/22 surgery  - 11/23/22 f/u outpatient GYN surgery: rec lifting restrictions 2 more weeks  -CT abdomen and pelvis as above    Chronic Medical Problems     #Behcet's disease  -Per rheumatology continue to hold Humira  -Continue with 01/08/23 for recurrent Aptos ulcers  -Dapsone and clindamycin per dermatology for scars      #Wolff-Parkinson-White  #History PVCs status post ablation  -Ablation August 2023    CODE STATUS:  DVT PPX:  DIET:      Disposition: (Please see PAF column for Expected D/C Date)   Today's date: 11/23/2022  Admit Date: 11/23/2022  2:32 PM  Service status: Inpatient: risk of morbidity and mortality  Clinical  Milestones: improving lactic acidosis, r/o PE and abdominal surgical infection  Anticipated discharge needs: TBD    History of Presenting Illness:   Hailey [name] is a morbidly obese 42 y.o. 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  States she has been off her adiluminab since the week before her laparoscopic surgery.  States that when she has been off her immunosuppressive medication it is common for her to get intermittent fevers.  2 days ago she had a fever of 100.6.  She has also had sore throat nonproductive cough that is intermittent generalized fatigue.  Home COVID test was negative.  She saw her rheumatologist yesterday who thought she may be getting pneumonia and so gave her doxycycline that she has taken 2 doses of.  She followed up with her surgeon today who recommended that she go to urgent care for evaluation.  At urgent care they thought she should come to the emergency room to rule out a PE.  No prior history DVT or PE.    In the ED afebrile , HR 114 but otherwise hemodynamically stable on room air.  Chest x-ray no acute process.  COVID flu strep negative.  WBC 10.78 with lactic acid 2.2 but increased to 3.0 after 2 L normal saline.  With repeat 2.2.  No other significant findings on labs.  ECG sinus tachycardia.  Past Medical History:     Past Medical History:   Diagnosis Date    Anxiety     Arrhythmia     WPW ablation in 2005, PVC ablation 8/23    Behcet's disease     She sees Dr. Lauro Regulus    BMI 45.0-49.9, adult     Fever 11/09/2022    Headache     migraine    Hx of ovarian cyst     Lower back pain     Meningitis 2011    Viral // treated and resolved    Pain in wrist 11/09/2022    Post-operative nausea and  vomiting     Scoliosis     moderate// used a Brace at a young age         Past Surgical History:     Past Surgical History:   Procedure Laterality Date    ABLATION - PVC'S N/A 06/08/2022    Procedure: Ablation - PVC's;  Surgeon: Jaynie Bream, MD;  Location: [facility];  Service: Cardiovascular;  Laterality: N/A;  SAME DAY DISCHARGE  CARTO NO TEE    ARTHROTOMY, WRIST Right 12/03/2020    Procedure: RIGHT UPPER EXTREMITY SYNOVIAL BIOPSY;  Surgeon: Lenox Ahr, MD;  Location: [facility];  Service: Plastics;  Laterality: Right;    BIOPSY, LYMPH NODE N/A 07/23/2018    Procedure: BIOPSY, LYMPH NODE;  Surgeon: Neldon Labella, MD;  Location: [facility];  Service: ENT;  Laterality: N/A;  NECK DEEP LYMPH NODE BIOPSY    BONE MARROW BIOPSY  2020    led to behcet's dx    CARDIAC ABLATION  2005    [facility] for Wolff-Parkinson-White    CHOLECYSTECTOMY  2009    [facility] DIAGNOSTIC  2010    DILATION AND CURETTAGE OF UTERUS  2010    FINGER GANGLION CYST EXCISION  04/2018    GANGLION CYST EXCISION  2014    04/2018 cyst removed from index finger-[facility]  2016    HYSTERECTOMY  2012    HYSTEROSCOPY  2011    indication: Abnormal bleeding    LAPAROSCOPIC, LYSIS, ADHESIONS N/A 11/09/2022    Procedure: LAPAROSCOPIC LYSIS OF ADHESIONS;  Surgeon: Lindell Noe, MD;  Location: [facility];  Service: Gynecology;  Laterality: N/A;    LAPAROSCOPIC, SALPINGECTOMY Left 11/09/2022    Procedure: LAPAROSCOPIC, SALPINGECTOMY LEFT;  Surgeon: Lindell Noe, MD;  Location: [facility];  Service: Gynecology;  Laterality: Left;    LAPAROSCOPY, DIAGNOSTIC N/A 11/09/2022    Procedure: LAPAROSCOPY, DIAGNOSTIC;  Surgeon: Lindell Noe, MD;  Location: [facility];  Service: Gynecology;  Laterality: N/A;  [id number]    UMBILICAL HERNIA REPAIR  2016    UPPER GASTROINTESTINAL ENDOSCOPY  2009    WISDOM TOOTH EXTRACTION  1998       Family History:     Family History   Adopted: Yes       Social History:      Social History     Tobacco Use   Smoking Status Never   Smokeless Tobacco Never     Social History     Substance and Sexual Activity   Alcohol Use Not Currently    Alcohol/week: 0.0 standard drinks of alcohol    Comment:  rarely     Social History     Substance and Sexual Activity   Drug Use Never       Allergies:     Allergies   Allergen Reactions    Fruit Blend Flavor [Flavoring Agent] Anaphylaxis and Swelling     Tropical Fruit     Cyanoacrylate     Latex Hives, Swelling, Other (See Comments) and Drug-Induced Flushing    Adhesive [Wound Dressing Adhesive] Itching    Other Itching and Rash     Sutures Monocryl    Oxycodone Hallucinations       Medications:     Home Medications       Med List Status: In Progress Set By: Carole Binning, RN at 11/23/2022  3:05 PM              Adalimumab 40 MG/0.4ML Pen-injector Kit     Inject 0.4 mLs (40 mg) into the skin once a week Please provide citrate free formulation.     Patient not taking: Reported on 11/23/2022     Apremilast (Otezla) 30 MG Tab     Take 1 tablet (30 mg) by mouth 2 (two) times daily     Azelastine-Fluticasone 137-50 MCG/ACT Suspension     1 spray by Nasal route 2 (two) times daily     botulinum Toxin Type A (Botox) 200 units Recon Soln injection     Inject 155 units IM every 12 weeks     busPIRone (BUSPAR) 5 MG tablet     TAKE 1 TABLET (5 MG) BY MOUTH 3 (THREE) TIMES DAILY AS NEEDED (ANXIETY)     clindamycin (CLEOCIN T) 1 % lotion     Apply topically every morning     colesevelam (WELCHOL) 625 MG tablet     Take 1 tablet (625 mg) by mouth 2 (two) times daily with meals     Patient taking differently: Take 2 tablets (1,250 mg) by mouth every morning     cyclobenzaprine (FLEXERIL) 5 MG tablet     TAKE 1 TABLET FOR MUSCLE SPASMS AT NIGHT AS NEEDED     Dapsone 7.5 % Gel     Apply topically every morning     doxycycline (VIBRAMYCIN) 100 MG capsule     Please take 1 tablets twice daily until done.     EPINEPHrine (EPIPEN 2-PAK) 0.3 MG/0.3ML Solution Auto-injector  injection     Inject 0.3 mLs (0.3 mg) into the muscle once as needed (Anaphylaxis) ; go to ER after injection     Patient not taking: Reported on 11/23/2022     hydrOXYzine (ATARAX) 10 MG tablet     Take 1 tablet (10 mg) by mouth nightly     ibuprofen (ADVIL) 200 MG tablet     Take 3 tablets (600 mg) by mouth every 6 (six) hours as needed for Pain     Jublia 10 % Solution          lidocaine-prilocaine (EMLA) cream     Apply topically as needed Dermatology procedures     Multiple Vitamins-Minerals (MULTIVITAMIN ADULT PO)     Take by mouth nightly     Patient not taking: Reported on 11/23/2022     Omega-3 1000 MG Cap     Take by mouth nightly     Patient not taking: Reported on 11/23/2022     ondansetron (ZOFRAN-ODT) 4 MG disintegrating tablet     TAKE 1 TABLET (4 MG) BY MOUTH EVERY 8 (EIGHT) HOURS AS NEEDED  FOR NAUSEA     Rimegepant Sulfate 75 MG Tablet Dispersible     Take 1 tablet (75 mg) by mouth every other day For migraine prevention     rizatriptan (MAXALT) 10 MG tablet     Take 1-2 tablets (10-20 mg total) by mouth once as needed for Migraine Max 3 days a week     valACYclovir (VALTREX) 1000 MG tablet     Take 2 tabs at onset; repeat once in 12 hours.  Total 4 tabs per outbreak     vitamin D (CHOLECALCIFEROL) 25 MCG (1000 UT) tablet     Take 1 tablet (1,000 Units) by mouth nightly     Patient not taking: Reported on 11/23/2022              Review of Systems:   All other systems were reviewed and are negative except: as per HPI    Physical Exam:   Patient Vitals for the past 24 hrs:   BP Temp Temp src Pulse Resp SpO2 Height Weight   11/23/22 1800 145/87 -- -- 97 19 96 % -- --   11/23/22 1732 144/72 -- -- 88 12 95 % -- --   11/23/22 1546 163/83 -- -- 98 16 97 % -- --   11/23/22 1421 (!) 157/101 100.1 F (37.8 C) Oral (!) 119 16 95 % 1.727 m (5\' 8" ) 142.9 kg (315 lb)   11/23/22 1417 -- -- -- (!) 114 -- 96 % -- --     Body mass index is 47.9 kg/m.    Intake/Output Summary (Last 24 hours) at 11/23/2022 2135  Last data  filed at 11/23/2022 1748  Gross per 24 hour   Intake 2000 ml   Output --   Net 2000 ml       General: awake, well nourished, well developed in no respiratory distress  HEENT: perrla, eomi, sclera anicteric  oropharynx clear without lesions, mucous membranes moist  Neck: supple, no lymphadenopathy, no thyromegaly, no JVD  Cardiovascular: regular rate and rhythm, no murmurs, rubs or gallops  Lungs: clear to auscultation bilaterally, without wheezing, rhonchi, or rales  Abdomen: soft, non-tender, non-distended; no palpable masses, no hepatosplenomegaly, normoactive bowel sounds, no rebound or guarding; laparoscopic surgical sites well approximated with no erythema or tenderness  Extremities: strength 5/5 upper and lower  extremities; no peripheral edema  Neuro: cranial nerves grossly intact, no focal deficit, clear speech, sensation intact  Skin: no rashes or lesions noted        Labs:     Results       Procedure Component Value Units Date/Time    Lactic Acid 11/25/2022  (Abnormal) Collected: 11/23/22 1957    Specimen: Blood Updated: 11/23/22 2013     Lactic Acid 2.2 mmol/L     Culture Blood Aerobic and Anaerobic 11/25/22 Collected: 11/23/22 1535    Specimen: Arm from Blood, Venipuncture Updated: 11/23/22 1914    Narrative:      1 BLUE+1 PURPLE    Culture Blood Aerobic and Anaerobic 11/25/22 Collected: 11/23/22 1450    Specimen: Arm from Blood, Venipuncture Updated: 11/23/22 1914    Narrative:      1 BLUE+1 PURPLE    Urinalysis Reflex to Microscopic Exam- Reflex to Culture 11/25/22  (Abnormal) Collected: 11/23/22 1753     Updated: 11/23/22 1843     Urine Type Urine, Clean Ca     Color, UA Straw     Clarity, UA Hazy     Specific Gravity UA 1.014  Urine pH 5.5     Leukocyte Esterase, UA Negative     Nitrite, UA Negative     Protein, UR Negative     Glucose, UA Negative     Ketones UA Trace     Urobilinogen, UA Normal mg/dL      Bilirubin, UA Negative     Blood, UA Negative    Lactic Acid [[id number]]   (Abnormal) Collected: 11/23/22 1753    Specimen: Blood Updated: 11/23/22 1827     Lactic Acid 3.0 mmol/L     Narrative:      Cancel second order for specimen if the initial level is less  than 2.38mEq/L    Urine HCG, POC/ Qualitative [[id number]] Collected: 11/23/22 1809    Specimen: Urine Updated: 11/23/22 1810     POCT QC Pass     POCT Pregnancy HCG Test, UR Negative     Comment: Negative Value is Normal in Healthy Males or Healthy non-pregnant Females    GROUP A STREP, RAPID ANTIGEN [[id number]] Collected: 11/23/22 1450    Specimen: Throat Updated: 11/23/22 1601     Group A Strep, Rapid Antigen Negative    COVID-19 (SARS-CoV-2) and Influenza A/B, NAA (Liat Rapid)- Admission [[id number]] Collected: 11/23/22 1450    Specimen: Culturette from Nasopharyngeal Updated: 11/23/22 1552     Purpose of COVID testing Diagnostic -PUI     SARS-CoV-2 Specimen Source Nasal Swab     SARS CoV 2 Overall Result Not Detected     Influenza A Not Detected     Influenza B Not Detected    Narrative:      o Collect and clearly label specimen type:  o PREFERRED-Upper respiratory specimen: One Nasal Swab in  Transport Media.  o Hand deliver to laboratory ASAP  Diagnostic -PUI    Comprehensive metabolic panel [[id number]] Collected: 11/23/22 1450    Specimen: Blood Updated: 11/23/22 1542     Glucose 97 mg/dL      BUN 9.0 mg/dL      Creatinine 0.7 mg/dL      Sodium 730 mEq/L      Potassium 4.1 mEq/L      Chloride 106 mEq/L      CO2 21 mEq/L      Calcium 9.8 mg/dL      Protein, Total 7.7 g/dL      Albumin 4.3 g/dL      AST (SGOT) 36 U/L      ALT 51 U/L      Alkaline Phosphatase 80 U/L      Bilirubin, Total 0.6 mg/dL      Globulin 3.4 g/dL      Albumin/Globulin Ratio 1.3     Anion Gap 9.0     eGFR >60.0 mL/min/1.73 m2     CBC and differential [[id number]]  (Abnormal) Collected: 11/23/22 1450    Specimen: Blood Updated: 11/23/22 1531     WBC 10.78 x10 3/uL      Hgb 14.4 g/dL      Hematocrit 30.1 %      Platelets 402 x10 3/uL      RBC 4.57 x10 6/uL       MCV 91.7 fL      MCH 31.5 pg      MCHC 34.4 g/dL      RDW 13 %      MPV 8.5 fL      Instrument Absolute Neutrophil Count 7.91 x10 3/uL      Neutrophils 73.3 %      Lymphocytes  Automated 18.2 %      Monocytes 7.1 %      Eosinophils Automated 0.6 %      Basophils Automated 0.4 %      Immature Granulocytes 0.4 %      Nucleated RBC 0.0 /100 WBC      Neutrophils Absolute 7.91 x10 3/uL      Lymphocytes Absolute Automated 1.96 x10 3/uL      Monocytes Absolute Automated 0.76 x10 3/uL      Eosinophils Absolute Automated 0.07 x10 3/uL      Basophils Absolute Automated 0.04 x10 3/uL      Immature Granulocytes Absolute 0.04 x10 3/uL      Absolute NRBC 0.00 x10 3/uL     Lactic Acid [[id number]]  (Abnormal) Collected: 11/23/22 1450    Specimen: Blood Updated: 11/23/22 1518     Lactic Acid 2.2 mmol/L             Imaging:   XR Chest  AP Portable    Result Date: 11/23/2022   No acute disease. Rocky Crafts, MD 11/23/2022 3:12 PM     Micro:     Microbiology Results (last 15 days)       Procedure Component Value Units Date/Time    Culture Blood Aerobic and Anaerobic [[id number]] Collected: 11/23/22 1535    Order Status: Sent Specimen: Arm from Blood, Venipuncture Updated: 11/23/22 1914    Narrative:      1 BLUE+1 PURPLE    COVID-19 (SARS-CoV-2) and Influenza A/B, NAA (Liat Rapid)- Admission [[id number]] Collected: 11/23/22 1450    Order Status: Completed Specimen: Culturette from Nasopharyngeal Updated: 11/23/22 1552     Purpose of COVID testing Diagnostic -PUI     SARS-CoV-2 Specimen Source Nasal Swab     SARS CoV 2 Overall Result Not Detected     Comment: __________________________________________________  -A result of "Detected" indicates POSITIVE for the    presence of SARS CoV-2 RNA  -A result of "Not Detected" indicates NEGATIVE for the    presence of SARS CoV-2 RNA  __________________________________________________________  Test performed using the Roche cobas Liat SARS-CoV-2 assay. This assay is  only for use under the Food and  Drug Administration's Emergency Use  Authorization. This is a real-time RT-PCR assay for the qualitative  detection of SARS-CoV-2 RNA. Viral nucleic acids may persist in vivo,  independent of viability. Detection of viral nucleic acid does not imply the  presence of infectious virus, or that virus nucleic acid is the cause of  clinical symptoms. Negative results do not preclude SARS-CoV-2 infection and  should not be used as the sole basis for diagnosis, treatment or other  patient management decisions. Negative results must be combined with  clinical observations, patient history, and/or epidemiological information.  Invalid results may be due to inhibiting substances in the specimen and  recollection should occur. Please see Fact Sheets for patients and providers  located:  WirelessDSLBlog.no          Influenza A Not Detected     Influenza B Not Detected     Comment: Test performed using the Roche cobas Liat SARS-CoV-2 & Influenza A/B assay.  This assay is only for use under the Food and Drug Administration's  Emergency Use Authorization. This is a multiplex real-time RT-PCR assay  intended for the simultaneous in vitro qualitative detection and  differentiation of SARS-CoV-2, influenza A, and influenza B virus RNA. Viral  nucleic acids may persist in vivo, independent of viability. Detection of  viral nucleic  acid does not imply the presence of infectious virus, or that  virus nucleic acid is the cause of clinical symptoms. Negative results do  not preclude SARS-CoV-2, influenza A, and/or influenza B infection and  should not be used as the sole basis for diagnosis, treatment or other  patient management decisions. Negative results must be combined with  clinical observations, patient history, and/or epidemiological information.  Invalid results may be due to inhibiting substances in the specimen and  recollection should occur. Please see Fact Sheets for patients and  providers  located: http://olson-hall.info/.         Narrative:      o Collect and clearly label specimen type:  o PREFERRED-Upper respiratory specimen: One Nasal Swab in  Transport Media.  o Hand deliver to laboratory ASAP  Diagnostic -PUI    Culture Blood Aerobic and Anaerobic [[id number]] Collected: 11/23/22 1450    Order Status: Sent Specimen: Arm from Blood, Venipuncture Updated: 11/23/22 1914    Narrative:      1 BLUE+1 PURPLE    GROUP A STREP, RAPID ANTIGEN [[id number]] Collected: 11/23/22 1450    Order Status: Completed Specimen: Throat Updated: 11/23/22 1601     Group A Strep, Rapid Antigen Negative    Throat Culture [[id number]] Collected: 11/23/22 1450    Order Status: No result Updated: 11/23/22 1853              Signed by: Ellwood Handler, MD  FX:[id number], Darrick Meigs, MD

## 2022-11-23 NOTE — Plan of Care (Signed)
Medicine Progress Note:  At 2300H, PO contrast given to patient and instructed.  CT chest and abdomen done.  No acute events overnight.      Neuro: alert and oriented x4  Cardiac/Tele: on tele, denies chest pain, Sinus tachycardia.  Pulm: on room air, no SOB  Skin: intact, scar on the left foot  GI/GU/Last BM: continent x2  Mobility: independent  Active Lines/Drains: PIV on the right Ac  Pain: complains of sore throat  Safety: Safety precautions enforced. Purposeful rounding done. 2/4 side rails. Call bell within reach. Bed locked and on low position.           Problem: Pain interferes with ability to perform ADL  Goal: Pain at adequate level as identified by patient  Outcome: Progressing  Flowsheets (Taken 11/23/2022 2228)  Pain at adequate level as identified by patient:   Identify patient comfort function goal   Assess for risk of opioid induced respiratory depression, including snoring/sleep apnea. Alert healthcare team of risk factors identified.   Assess pain on admission, during daily assessment and/or before any "as needed" intervention(s)   Reassess pain within 30-60 minutes of any procedure/intervention, per Pain Assessment, Intervention, Reassessment (AIR) Cycle   Evaluate if patient comfort function goal is met   Evaluate patient's satisfaction with pain management progress   Offer non-pharmacological pain management interventions   Include patient/patient care companion in decisions related to pain management as needed   Consult/collaborate with Physical Therapy, Occupational Therapy, and/or Speech Therapy   Consult/collaborate with Pain Service     Problem: Side Effects from Pain Analgesia  Goal: Patient will experience minimal side effects of analgesic therapy  Outcome: Progressing  Flowsheets (Taken 11/23/2022 2228)  Patient will experience minimal side effects of analgesic therapy:   Monitor/assess patient's respiratory status (RR depth, effort, breath sounds)   Prevent/manage side effects per LIP  orders (i.e. nausea, vomiting, pruritus, constipation, urinary retention, etc.)   Assess for changes in cognitive function   Evaluate for opioid-induced sedation with appropriate assessment tool (i.e. POSS)     Problem: Safety  Goal: Patient will be free from injury during hospitalization  Outcome: Progressing  Flowsheets (Taken 11/23/2022 2228)  Patient will be free from injury during hospitalization:   Assess patient's risk for falls and implement fall prevention plan of care per policy   Provide and maintain safe environment   Use appropriate transfer methods   Ensure appropriate safety devices are available at the bedside   Include patient/ family/ care giver in decisions related to safety   Hourly rounding   Assess for patients risk for elopement and implement Elopement Risk Plan per policy   Provide alternative method of communication if needed (communication boards, writing)  Goal: Patient will be free from infection during hospitalization  Outcome: Progressing  Flowsheets (Taken 11/23/2022 2228)  Free from Infection during hospitalization:   Assess and monitor for signs and symptoms of infection   Monitor lab/diagnostic results   Monitor all insertion sites (i.e. indwelling lines, tubes, urinary catheters, and drains)   Encourage patient and family to use good hand hygiene technique     Problem: Pain  Goal: Pain at adequate level as identified by patient  Outcome: Progressing  Flowsheets (Taken 11/23/2022 2228)  Pain at adequate level as identified by patient:   Identify patient comfort function goal   Assess for risk of opioid induced respiratory depression, including snoring/sleep apnea. Alert healthcare team of risk factors identified.   Assess pain on admission, during daily assessment  and/or before any "as needed" intervention(s)   Reassess pain within 30-60 minutes of any procedure/intervention, per Pain Assessment, Intervention, Reassessment (AIR) Cycle   Evaluate if patient comfort function goal is  met   Evaluate patient's satisfaction with pain management progress   Offer non-pharmacological pain management interventions   Include patient/patient care companion in decisions related to pain management as needed   Consult/collaborate with Physical Therapy, Occupational Therapy, and/or Speech Therapy   Consult/collaborate with Pain Service     Problem: Psychosocial and Spiritual Needs  Goal: Demonstrates ability to cope with hospitalization/illness  Outcome: Progressing  Flowsheets (Taken 11/23/2022 2228)  Demonstrates ability to cope with hospitalizations/illness:   Encourage verbalization of feelings/concerns/expectations   Provide quiet environment   Assist patient to identify own strengths and abilities   Encourage patient to set small goals for self   Encourage participation in diversional activity   Reinforce positive adaptation of new coping behaviors   Include patient/ patient care companion in decisions   Communicate referral to spiritual care as appropriate     Problem: Infection  Goal: Free from infection  Outcome: Progressing  Flowsheets (Taken 11/23/2022 2228)  Free from infection:   Assess for signs/symptoms of infection   Utilize isolation precautions per protocol/policy   Assess immunization status   Consult/collaborate with Infection Preventionist   Utilize sepsis protocol

## 2022-11-24 ENCOUNTER — Observation Stay: Payer: BLUE CROSS/BLUE SHIELD

## 2022-11-24 DIAGNOSIS — R079 Chest pain, unspecified: Secondary | ICD-10-CM

## 2022-11-24 LAB — ECG 12-LEAD
Atrial Rate: 92 {beats}/min
IHS MUSE NARRATIVE AND IMPRESSION: NORMAL
P Axis: 41 degrees
P-R Interval: 180 ms
Q-T Interval: 366 ms
QRS Duration: 76 ms
QTC Calculation (Bezet): 452 ms
R Axis: 51 degrees
T Axis: 57 degrees
Ventricular Rate: 92 {beats}/min

## 2022-11-24 MED ORDER — ONDANSETRON HCL 4 MG/2ML IJ SOLN
4.0000 mg | Freq: Four times a day (QID) | INTRAMUSCULAR | Status: DC | PRN
Start: 2022-11-24 — End: 2022-11-25
  Administered 2022-11-24: 4 mg via INTRAVENOUS
  Filled 2022-11-24: qty 2

## 2022-11-24 MED ORDER — GUAIFENESIN-DM 100-10 MG/5ML PO SYRP
10.0000 mL | ORAL_SOLUTION | ORAL | Status: DC | PRN
Start: 2022-11-24 — End: 2022-11-25
  Administered 2022-11-24: 10 mL via ORAL
  Filled 2022-11-24: qty 10

## 2022-11-24 MED ORDER — IOHEXOL 350 MG/ML IV SOLN
100.0000 mL | Freq: Once | INTRAVENOUS | Status: AC | PRN
Start: 2022-11-24 — End: 2022-11-24
  Administered 2022-11-24: 100 mL via INTRAVENOUS

## 2022-11-24 MED ORDER — ALBUTEROL-IPRATROPIUM 2.5-0.5 (3) MG/3ML IN SOLN
3.0000 mL | Freq: Four times a day (QID) | RESPIRATORY_TRACT | Status: DC
Start: 2022-11-24 — End: 2022-11-25
  Administered 2022-11-25: 3 mL via RESPIRATORY_TRACT
  Filled 2022-11-24 (×6): qty 3

## 2022-11-24 MED ORDER — ACETAMINOPHEN 325 MG PO TABS
325.0000 mg | ORAL_TABLET | ORAL | Status: DC | PRN
Start: 2022-11-24 — End: 2022-11-25
  Administered 2022-11-24: 325 mg via ORAL
  Filled 2022-11-24: qty 1

## 2022-11-24 NOTE — Progress Notes (Signed)
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  C/o CP with coughing.    2. Elevated Lactic acid, Elevated WBC on admission  Suspicion for bacterial infection but no clear source is seen.  Patient is on immunosuppressants. High risk for a serious infection.  F/u Bl Cx.  Continue Zosyn for now.    Case discussed with: Husband, Patient, RN      Additional Diagnoses:         Safety Checklist:     DVT prophylaxis:  CHEST guideline (See page [phone]) Chemical   Foley:  [place] Rn Foley protocol Not present   IVs:  Peripheral IV   PT/OT: Not needed   Daily CBC & or Chem ordered:  SHM/ABIM guidelines (see #5) Yes, due to clinical and lab instability   Reference for approximate charges of common labs: CBC auto diff - $76  BMP - $99  Mg - $79    Lines:     Patient Lines/Drains/Airways Status       Active PICC Line / CVC Line / PIV Line / Drain / Airway / Intraosseous Line / Epidural Line / ART Line / Line / Wound / Pressure Ulcer / NG/OG Tube       Name Placement date Placement time Site Days    Peripheral IV 11/23/22 20 G Diffusion Right Antecubital 11/23/22  1443  Antecubital  1                     Disposition: (Please see PAF column for Expected D/C Date)   Today's date: 11/24/2022  Admit Date: 11/23/2022  2:32 PM  LOS: 0  Clinical Milestones:  Infection  Anticipated discharge needs: TBD      Subjective     CC: Viral syndrome    Interval History/24 hour events: sinus symptoms    HPI/Subjective: c/o cough     Review of Systems:     As per HPI    Physical Exam:     VITAL SIGNS PHYSICAL EXAM   Temp:  [98.2 F (36.8 C)-99.1 F (37.3 C)] 98.8 F (37.1 C)  Heart Rate:  [91-104] 94  Resp Rate:  [1-20] 1  BP: (132-150)/(85-95) 142/85  Blood Glucose:        No intake or output data in the 24 hours ending 11/24/22 2000 Physical Exam  General: awake, alert X 3  Cardiovascular: regular rate and rhythm, no murmurs, rubs or gallops  Lungs: clear to auscultation bilaterally, without wheezing, rhonchi, or rales  Abdomen: soft, non-tender, non-distended; no palpable masses,  normoactive bowel sounds  Extremities: no edema          Meds:     Medications were reviewed:  Current Facility-Administered Medications   Medication Dose Route Frequency    albuterol-ipratropium  3 mL Nebulization Q6H WA  Apremilast  30 mg Oral BID    clindamycin   Topical QAM    colesevelam  625 mg Oral BID Meals    enoxaparin  0.5 mg/kg Subcutaneous Daily    hydrOXYzine  10 mg Oral QHS    NON-FORMULARY PAT OWN MED order form  1 g Topical QAM    piperacillin-tazobactam  4.5 g Intravenous Q6H     Current Facility-Administered Medications   Medication Dose Route Frequency Last Rate     Current Facility-Administered Medications   Medication Dose Route    acetaminophen  325 mg Oral    busPIRone  5 mg Oral    dextrose  15 g of glucose Oral    Or    dextrose  12.5 g Intravenous    Or    dextrose  12.5 g Intravenous    Or    glucagon (rDNA)  1 mg Intramuscular    guaiFENesin-dextromethorphan  10 mL Oral         Labs:     Labs (last 72 hours):    Recent Labs   Lab 11/23/22  1450   WBC 10.78*   Hgb 14.4   Hematocrit 41.9   Platelets 402*          Recent Labs   Lab 11/23/22  1450   Sodium 136   Potassium 4.1   Chloride 106   CO2 21   BUN 9.0   Creatinine 0.7   Calcium 9.8   Albumin 4.3   Protein, Total  7.7   Bilirubin, Total 0.6   Alkaline Phosphatase 80   ALT 51   AST (SGOT) 36   Glucose 97                   Microbiology, reviewed and are significant for:  Microbiology Results (last 15 days)       Procedure Component Value Units Date/Time    Culture Blood Aerobic and Anaerobic [[id number]] Collected: 11/23/22 1535    Order Status: Completed Specimen: Arm from Blood, Venipuncture Updated: 11/24/22 1921    Narrative:      ORDER#: [id number]                                    ORDERED BY: Vickki Hearing, ROBERT  SOURCE: Blood, Venipuncture arm                      COLLECTED:  11/23/22 15:35  ANTIBIOTICS AT COLL.:                                RECEIVED :  11/23/22 19:14  Culture Blood Aerobic and Anaerobic        PRELIM      11/24/22 19:21  11/24/22   No Growth after 1 day/s of incubation.      COVID-19 (SARS-CoV-2) and Influenza A/B, NAA (Liat Rapid)- Admission [[id number]] Collected: 11/23/22 1450    Order Status: Completed Specimen: Culturette from Nasopharyngeal Updated: 11/23/22 1552     Purpose of COVID testing Diagnostic -PUI     SARS-CoV-2 Specimen Source Nasal Swab     SARS CoV 2 Overall Result Not Detected     Comment: __________________________________________________  -A result of "Detected" indicates POSITIVE for the    presence of SARS CoV-2 RNA  -A result of "Not Detected" indicates NEGATIVE for the  presence of SARS CoV-2 RNA  __________________________________________________________  Test performed using the Roche cobas Liat SARS-CoV-2 assay. This assay is  only for use under the Food and Drug Administration's Emergency Use  Authorization. This is a real-time RT-PCR assay for the qualitative  detection of SARS-CoV-2 RNA. Viral nucleic acids may persist in vivo,  independent of viability. Detection of viral nucleic acid does not imply the  presence of infectious virus, or that virus nucleic acid is the cause of  clinical symptoms. Negative results do not preclude SARS-CoV-2 infection and  should not be used  as the sole basis for diagnosis, treatment or other  patient management decisions. Negative results must be combined with  clinical observations, patient history, and/or epidemiological information.  Invalid results may be due to inhibiting substances in the specimen and  recollection should occur. Please see Fact Sheets for patients and providers  located:  https://www.benson-chung.com/          Influenza A Not Detected     Influenza B Not Detected     Comment: Test performed using the Roche cobas Liat SARS-CoV-2 & Influenza A/B assay.  This assay is only for use under the Food and Drug Administration's  Emergency Use Authorization. This is a multiplex real-time RT-PCR assay  intended for the simultaneous in vitro qualitative detection and  differentiation of SARS-CoV-2, influenza A, and influenza B virus RNA. Viral  nucleic acids may persist in vivo, independent of viability. Detection of  viral nucleic acid does not imply the presence of infectious virus, or that  virus nucleic acid is the cause of clinical symptoms. Negative results do  not preclude SARS-CoV-2, influenza A, and/or influenza B infection and  should not be used as the sole basis for diagnosis, treatment or other  patient management decisions. Negative results must be combined with  clinical observations, patient history, and/or epidemiological information.  Invalid results may be due to inhibiting substances in the specimen and  recollection should occur. Please see Fact Sheets for patients and providers  located: http://olson-hall.info/.         Narrative:      o Collect and clearly label specimen type:  o PREFERRED-Upper respiratory specimen: One Nasal Swab in  Transport Media.  o Hand deliver to laboratory ASAP  Diagnostic -PUI    Culture Blood Aerobic and Anaerobic [[id number]] Collected: 11/23/22 1450    Order Status: Completed Specimen: Arm from Blood, Venipuncture Updated: 11/24/22 1921    Narrative:       ORDER#: [id number]                                    ORDERED BY: Philmore Pali  SOURCE: Blood, Venipuncture SP                       COLLECTED:  11/23/22 14:50  ANTIBIOTICS AT COLL.:                                RECEIVED :  11/23/22 19:14  Culture Blood Aerobic and Anaerobic        PRELIM      11/24/22 19:21  11/24/22   No Growth after 1 day/s of incubation.      GROUP A STREP, RAPID ANTIGEN [[id number]] Collected: 11/23/22 1450    Order Status: Completed Specimen: Throat Updated: 11/23/22 1601  Group A Strep, Rapid Antigen Negative    Throat Culture [[id number]] Collected: 11/23/22 1450    Order Status: Completed Updated: 11/24/22 1702    Narrative:      ORDER#: [id number]                                    ORDERED BY: Roque Lias  SOURCE: Throat                                       COLLECTED:  11/23/22 14:50  ANTIBIOTICS AT COLL.:                                RECEIVED :  11/23/22 18:53  Throat Culture                             PRELIM      11/24/22 17:02  11/24/22   Culture in progress, final to follow              Imaging, reviewed and are significant for:  N/a      Signed by: Orlie Pollen, MD

## 2022-11-24 NOTE — Plan of Care (Signed)
Nursing Progress Note:  Shift event- All medication given as per schedule . Pt calm and cooperative . 12 Lead EKG done .     Neuro: Pt is A&Ox4, VSS  GIGU:  Pt is continent of bowel and bladder. Abd soft, non distended and active BS.   Cardiac: Regular HR,  Tele, NSR  Pulm: Regular respirations, bilateral clear lungs sounds  Skin: Intact   Pain: Denies pain at this time .     Mobility: One person's assist with personal care.   Safety: Pt is a low fall risk. Fall precautions in place: bed in lowest position, fall mat in place, bed alarm on, call light is within reach.    Active lines :   Patient Lines/Drains/Airways Status       Active Lines, Drains and Airways       Name Placement date Placement time Site Days    Peripheral IV 11/23/22 20 G Diffusion Right Antecubital 11/23/22  1443  Antecubital  less than 1                    Vitals:    11/24/22 0328 11/24/22 0723 11/24/22 0800 11/24/22 1057   BP: (!) 147/95 (!) 150/95  (!) 150/94   Pulse: (!) 104 99 91 94   Resp: 17 17     Temp: 98.5 F (36.9 C) 98.2 F (36.8 C)  98.2 F (36.8 C)   TempSrc: Oral Oral  Oral   SpO2: 97% 96%  96%   Weight:       Height:             Intake/Output Summary (Last 24 hours) at 11/24/2022 1303  Last data filed at 11/23/2022 1748  Gross per 24 hour   Intake 2000 ml   Output --   Net 2000 ml             Problem: Pain interferes with ability to perform ADL  Goal: Pain at adequate level as identified by patient  Outcome: Progressing     Problem: Side Effects from Pain Analgesia  Goal: Patient will experience minimal side effects of analgesic therapy  Outcome: Progressing     Problem: Safety  Goal: Patient will be free from injury during hospitalization  Outcome: Progressing  Goal: Patient will be free from infection during hospitalization  Outcome: Progressing     Problem: Pain  Goal: Pain at adequate level as identified by patient  Outcome: Progressing     Problem: Psychosocial and Spiritual Needs  Goal: Demonstrates ability to cope with  hospitalization/illness  Outcome: Progressing     Problem: Infection  Goal: Free from infection  Outcome: Progressing

## 2022-11-24 NOTE — Progress Notes (Signed)
11/24/22 1115   CM Review   Acknowledgment of Outpatient/Observation Observation letter given     Signed and given

## 2022-11-24 NOTE — Plan of Care (Signed)
Problem: Safety  Goal: Patient will be free from injury during hospitalization  Outcome: Progressing  Flowsheets (Taken 11/23/2022 2228 by Jacklynn Lewis, RN)  Patient will be free from injury during hospitalization:   Assess patient's risk for falls and implement fall prevention plan of care per policy   Provide and maintain safe environment   Use appropriate transfer methods   Ensure appropriate safety devices are available at the bedside   Include patient/ family/ care giver in decisions related to safety   Hourly rounding   Assess for patients risk for elopement and implement [place] per policy   Provide alternative method of communication if needed (communication boards, writing)     Problem: Psychosocial and Spiritual Needs  Goal: Demonstrates ability to cope with hospitalization/illness  Outcome: Progressing  Flowsheets (Taken 11/23/2022 2228 by Jacklynn Lewis, RN)  Demonstrates ability to cope with hospitalizations/illness:   Encourage verbalization of feelings/concerns/expectations   Provide quiet environment   Assist patient to identify own strengths and abilities   Encourage patient to set small goals for self   Encourage participation in diversional activity   Reinforce positive adaptation of new coping behaviors   Include patient/ patient care companion in decisions   Communicate referral to spiritual care as appropriate     Problem: Infection  Goal: Free from infection  Outcome: Progressing  Flowsheets (Taken 11/23/2022 2228 by Jacklynn Lewis, RN)  Free from infection:   Assess for signs/symptoms of infection   Utilize isolation precautions per protocol/policy   Assess immunization status   Consult/collaborate with Infection Preventionist   Utilize sepsis protocol

## 2022-11-24 NOTE — UM Notes (Addendum)
Christy L. Tenkiller  Legal: Floydene Flock. [facility]  Female, 42 y.o., 1981-01-17    Viral Illness, Acute: Observation Care Observation Care Admission Criteria    11/23/2022 20:03 Observation  Care day #1  Med/Surg    HPI: 41y.o. F arrived [facility] ED p/ws intermittent fevers, HA, difficulty swallowing/Hoarse voice, chest pain/Tightness x1 week. Currently taking doxy. Pt had laparoscopic surgery ~2 week ago. Sent to ED to r/o PE    PMH: anxiety, arrhythmia, Behcet's Disease, BMI-45.0-49.9, fever, Migraine, lower back pain, Meningitis, wrist pain, scoliosis    Vs: 157/101, p 114, rr 126, t 100.1, sats 96%  CTSA- neg for PE    Active problem list: viral syndrome,   lactic acidosis  Ovarian cyst status post laparoscopic appendectomy with left salpingectomy and lysis of adhesions    COVID/FLu /Strep - neg  Wbc - 10.78  Lactic acid - 2.2 >>3.0 after 2LNS>>202      PLAN: Observation  Vs with pulse ox  WBC 10.78, lactic acid 2.2 --> 3.0 --> 2.2  - UA trace ketones  - f/u blood cx  - throat cx pending, rapid strep negative  - flu and covid negative  - ECG HR 115, sinus tachycardia  -CXR no acute process  -CTA chest rule out PE pending  -CT abdomen pelvis rule out infection pending  - given dexamethasone 10mg  IV, ketorolac 30mg  once, zosyn 4.5 gram once, 2L NS  - continue zosyn until abdominal infection r/o  - tele monitor     #Ovarian cyst status post laparoscopic appendectomy with left salpingectomy and lysis of adhesions  - 11/09/22 surgery  - 11/23/22 f/u outpatient GYN surgery: rec lifting restrictions 2 more weeks  -CT abdomen and pelvis as above     Chronic Medical Problems      #Behcet's disease  -Per rheumatology continue to hold Humira  -Continue with [place] Nail for recurrent [place] ulcers  -Dapsone and clindamycin per dermatology for scars        #Wolff-Parkinson-White  #History PVCs status post ablation  -Ablation August 2023       Completed by: Bonnita Levan, RN  [facility]  Utilization Review  Case  Management Office:  Ph: [phone]  Fax: [phone]  Please use fax number to provide authorization for hospital services or to request additional information    [facility]   ([place]) [facility]   ([place]) [facility]   [facility]) [facility]   [facility]) [facility]  [facility])   [place]  [place], [place] [address] [place]  [place], [place] [address] [address]  [place], [place] [address] [phone] [facility].  [place], [place] [address] [place]  [place], [place] [address]   NPI: [id number] NPI: [id number] NPI: [id number] NPI: [id number] NPI: [id number]

## 2022-11-25 ENCOUNTER — Encounter (INDEPENDENT_AMBULATORY_CARE_PROVIDER_SITE_OTHER): Payer: Self-pay | Admitting: Internal Medicine

## 2022-11-25 DIAGNOSIS — B349 Viral infection, unspecified: Secondary | ICD-10-CM

## 2022-11-25 DIAGNOSIS — M352 Behcet's disease: Secondary | ICD-10-CM

## 2022-11-25 DIAGNOSIS — R030 Elevated blood-pressure reading, without diagnosis of hypertension: Secondary | ICD-10-CM

## 2022-11-25 LAB — CBC
Absolute NRBC: 0 10*3/uL (ref 0.00–0.00)
Hematocrit: 36.6 % (ref 34.7–43.7)
Hgb: 12 g/dL (ref 11.4–14.8)
MCH: 30.8 pg (ref 25.1–33.5)
MCHC: 32.8 g/dL (ref 31.5–35.8)
MCV: 94.1 fL (ref 78.0–96.0)
MPV: 8.6 fL — ABNORMAL LOW (ref 8.9–12.5)
Nucleated RBC: 0 /100 WBC (ref 0.0–0.0)
Platelets: 334 10*3/uL (ref 142–346)
RBC: 3.89 10*6/uL — ABNORMAL LOW (ref 3.90–5.10)
RDW: 13 % (ref 11–15)
WBC: 7.3 10*3/uL (ref 3.10–9.50)

## 2022-11-25 LAB — COMPREHENSIVE METABOLIC PANEL
ALT: 44 U/L (ref 0–55)
AST (SGOT): 31 U/L (ref 5–41)
Albumin/Globulin Ratio: 1.1 (ref 0.9–2.2)
Albumin: 3.6 g/dL (ref 3.5–5.0)
Alkaline Phosphatase: 62 U/L (ref 37–117)
Anion Gap: 9 (ref 5.0–15.0)
BUN: 10 mg/dL (ref 7.0–21.0)
Bilirubin, Total: 0.4 mg/dL (ref 0.2–1.2)
CO2: 23 mEq/L (ref 17–29)
Calcium: 8.9 mg/dL (ref 8.5–10.5)
Chloride: 107 mEq/L (ref 99–111)
Creatinine: 0.8 mg/dL (ref 0.4–1.0)
Globulin: 3.3 g/dL (ref 2.0–3.6)
Glucose: 94 mg/dL (ref 70–100)
Potassium: 3.7 mEq/L (ref 3.5–5.3)
Protein, Total: 6.9 g/dL (ref 6.0–8.3)
Sodium: 139 mEq/L (ref 135–145)
eGFR: >60 mL/min/{1.73_m2} (ref >=60)

## 2022-11-25 LAB — PROCALCITONIN: Procalcitonin: 0.06 ng/ml (ref 0.00–0.10)

## 2022-11-25 MED ORDER — ALBUTEROL SULFATE HFA 108 (90 BASE) MCG/ACT IN AERS
2.0000 | INHALATION_SPRAY | RESPIRATORY_TRACT | 0 refills | Status: DC | PRN
Start: 2022-11-25 — End: 2022-12-06

## 2022-11-25 MED ORDER — PREDNISONE 20 MG PO TABS
40.0000 mg | ORAL_TABLET | Freq: Every morning | ORAL | 0 refills | Status: AC
Start: 2022-11-25 — End: 2022-11-30

## 2022-11-25 MED ORDER — COLESEVELAM HCL 625 MG PO TABS
1250.0000 mg | ORAL_TABLET | Freq: Every morning | ORAL | Status: DC
Start: 2022-11-25 — End: 2023-01-23

## 2022-11-25 NOTE — Discharge Summary -  Nursing (Signed)
Pt discharged to home with all her belongings . IV removed . Pt's home medication sent with the patients .

## 2022-11-25 NOTE — Progress Notes (Signed)
Hailey [name] is a 42 y.o. female  Admitted 11/23/2022  2:32 PM (Hospital day 0) for Viral syndrome [B34.9]  Sepsis, due to unspecified organism, unspecified whether acute organ dysfunction present [A41.9]        Major Shift Events:    NAEON, pt slept well     Review of Systems  Neuro:  Aox4   FC, MAE     Cardiac:  VSS   Afebrile    Temp:  [97.9 F (36.6 C)-99.1 F (37.3 C)] 97.9 F (36.6 C)  Heart Rate:  [77-99] 77  Resp Rate:  [17-19] 19  BP: (120-150)/(83-95) 120/83     Respiratory:  RA     GI/GU:  Reg diet   LBM 1/18  Voids to BR     BM this shift? Yes    Skin Assessment        Braden Scale Score: 21 (11/24/22 2000)    LDAWs  Patient Lines/Drains/Airways Status       Active Lines, Drains and Airways       Name Placement date Placement time Site Days    Peripheral IV 11/23/22 20 G Diffusion Right Antecubital 11/23/22  1443  Antecubital  1                      Psycho/Social:  Calm, cooperative     Interpreter Services:  Does the patient require an Interpreter? No       Disposition for Discharge: TBD

## 2022-11-25 NOTE — Discharge Summary (Signed)
Discharge Summary    Date:11/25/2022   Patient Name: Hailey [name], Hailey [name]  Attending Physician: No att. providers found    Date of Admission:   11/23/2022    Date of Discharge:   11/25/2022    Admitting Diagnosis:   Viral Syndrome    Discharge Dx:     Principal Diagnosis (Diagnosis after study, that is chiefly responsible for admission to inpatient status): Viral syndrome  Active Hospital Problems    Diagnosis POA    Principal Problem: Viral syndrome Yes      Resolved Hospital Problems   No resolved problems to display.       Treatment Team:        Procedures performed:   Radiology: all results from this admission  CT Abdomen Pelvis W IV And PO Cont    Result Date: 11/24/2022  No acute abnormality. Park Pope, MD 11/24/2022 7:18 AM    CT Angiogram Chest    Result Date: 11/24/2022  No acute pulmonary embolism. Park Pope, MD 11/24/2022 7:13 AM    XR Chest  AP Portable    Result Date: 11/23/2022   No acute disease. 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 She was started on Zosyn on admission and blood cultures were done.  Blood Cx day 1 are negative and she is doing well other than sinus symptoms. She will be discharged home on a course of steroids and albuterol inhaler. I think her symptoms are due to sinus infection, probably viral.    Condition at Discharge:   Good  Today:     BP 135/86    Pulse 83   Temp 98 F (36.7 C) (Oral)   Resp 19   Ht 1.727 m (5\' 8" )   Wt 143.7 kg (316 lb 12.8 oz)   LMP  (LMP Unknown)   SpO2 96%   BMI 48.17 kg/m   Ranges for the last 24 hours:  Temp:  [97.9 F (36.6 C)-99.1 F (37.3 C)] 98 F (36.7 C)  Heart Rate:  [77-95] 83  Resp Rate:  [18-19] 19  BP: (120-142)/(83-88) 135/86    Last set of labs   Recent Labs   Lab 11/25/22  0341   WBC 7.30   Hgb 12.0   Hematocrit 36.6   Platelets 334     Microbiology Results (last 15 days)       Procedure Component Value Units Date/Time    Culture Blood Aerobic and Anaerobic [[id number]] Collected: 11/23/22 1535    Order Status: Completed Specimen: Arm from Blood, Venipuncture Updated: 11/24/22 1921    Narrative:      ORDER#: [id number]                                    ORDERED BY: Philmore Pali  SOURCE: Blood, Venipuncture arm  COLLECTED:  11/23/22 15:35  ANTIBIOTICS AT COLL.:                                RECEIVED :  11/23/22 19:14  Culture Blood Aerobic and Anaerobic        PRELIM      11/24/22 19:21  11/24/22   No Growth after 1 day/s of incubation.      COVID-19 (SARS-CoV-2) and Influenza A/B, NAA (Liat Rapid)- Admission [[id number]] Collected: 11/23/22 1450    Order Status: Completed Specimen: Culturette from Nasopharyngeal Updated: 11/23/22 1552     Purpose of COVID testing Diagnostic -PUI     SARS-CoV-2 Specimen Source Nasal Swab     SARS CoV 2 Overall Result Not Detected     Comment: __________________________________________________  -A result of "Detected" indicates POSITIVE for the    presence of SARS CoV-2 RNA  -A result of "Not Detected" indicates NEGATIVE for the    presence of SARS CoV-2 RNA  __________________________________________________________  Test performed using the Roche cobas Liat SARS-CoV-2 assay. This assay is  only for use under the Food and Drug Administration's Emergency Use  Authorization. This is a real-time RT-PCR assay for the qualitative  detection of SARS-CoV-2 RNA.  Viral nucleic acids may persist in vivo,  independent of viability. Detection of viral nucleic acid does not imply the  presence of infectious virus, or that virus nucleic acid is the cause of  clinical symptoms. Negative results do not preclude SARS-CoV-2 infection and  should not be used as the sole basis for diagnosis, treatment or other  patient management decisions. Negative results must be combined with  clinical observations, patient history, and/or epidemiological information.  Invalid results may be due to inhibiting substances in the specimen and  recollection should occur. Please see Fact Sheets for patients and providers  located:  WirelessDSLBlog.no          Influenza A Not Detected     Influenza B Not Detected     Comment: Test performed using the Roche cobas Liat SARS-CoV-2 & Influenza A/B assay.  This assay is only for use under the Food and Drug Administration's  Emergency Use Authorization. This is a multiplex real-time RT-PCR assay  intended for the simultaneous in vitro qualitative detection and  differentiation of SARS-CoV-2, influenza A, and influenza B virus RNA. Viral  nucleic acids may persist in vivo, independent of viability. Detection of  viral nucleic acid does not imply the presence of infectious virus, or that  virus nucleic acid is the cause of clinical symptoms. Negative results do  not preclude SARS-CoV-2, influenza A, and/or influenza B infection and  should not be used as the sole basis for diagnosis, treatment or other  patient management decisions. Negative results must be combined with  clinical observations, patient history, and/or epidemiological information.  Invalid results may be due to inhibiting substances in the specimen and  recollection should occur. Please see Fact Sheets for patients and providers  located: http://www.rice.biz/.         Narrative:      o Collect and clearly label specimen type:  o PREFERRED-Upper  respiratory specimen: One Nasal Swab in  Transport Media.  o Hand deliver to laboratory ASAP  Diagnostic -PUI    Culture Blood Aerobic and Anaerobic [[id number]] Collected: 11/23/22 1450    Order Status: Completed Specimen: Arm from Blood, Venipuncture Updated: 11/24/22 1921    Narrative:  ORDER#: [id number]                                    ORDERED BY: SOLBERG, ROBERT  SOURCE: Blood, Venipuncture SP                       COLLECTED:  11/23/22 14:50  ANTIBIOTICS AT COLL.:                                RECEIVED :  11/23/22 19:14  Culture Blood Aerobic and Anaerobic        PRELIM      11/24/22 19:21  11/24/22   No Growth after 1 day/s of incubation.      GROUP A STREP, RAPID ANTIGEN [[id number]] Collected: 11/23/22 1450    Order Status: Completed Specimen: Throat Updated: 11/23/22 1601     Group A Strep, Rapid Antigen Negative    Throat Culture [[id number]] Collected: 11/23/22 1450    Order Status: Completed Updated: 11/24/22 1702    Narrative:      ORDER#: [id number]                                    ORDERED BY: Vickki Hearing, ROBERT  SOURCE: Throat                                       COLLECTED:  11/23/22 14:50  ANTIBIOTICS AT COLL.:                                RECEIVED :  11/23/22 18:53  Throat Culture                             PRELIM      11/24/22 17:02  11/24/22   Culture in progress, final to follow              Micro / Labs / Path pending:     Unresulted Labs       None            Discharge Instructions For Providers     Currently on Prednisone for 5 days             Discharge Instructions:       Discharge Diet: Cardiac Diet        Discharge References/Attachments    None       Disposition:  Home or Self Care     Discharge Medication List        Taking      albuterol sulfate HFA 108 (90 Base) MCG/ACT inhaler  Dose: 2 puff  Commonly known as: PROVENTIL  Inhale 2 puffs into the lungs every 4 (four) hours as needed for Shortness of Breath     Azelastine-Fluticasone 137-50 MCG/ACT Susp  Dose: 1 spray  1 spray by  Nasal route 2 (two) times daily     Botox 200 units Solr injection  Generic drug: botulinum toxin type A  Inject 155 units IM every 12 weeks     busPIRone 5 MG tablet  Dose: 5 mg  Commonly known as: BUSPAR  TAKE 1 TABLET (5 MG) BY MOUTH 3 (THREE) TIMES DAILY AS NEEDED (ANXIETY)     clindamycin 1 % lotion  Commonly known as: CLEOCIN T  Apply topically every morning     colesevelam 625 MG tablet  Dose: 1,250 mg  Commonly known as: WELCHOL  Take 2 tablets (1,250 mg) by mouth every morning     cyclobenzaprine 5 MG tablet  Commonly known as: FLEXERIL  TAKE 1 TABLET FOR MUSCLE SPASMS AT NIGHT AS NEEDED     Dapsone 7.5 % Gel  Apply topically every morning     hydrOXYzine 10 MG tablet  Dose: 10 mg  Commonly known as: ATARAX  Take 1 tablet (10 mg) by mouth nightly     ibuprofen 200 MG tablet  Dose: 600 mg  Commonly known as: ADVIL  Take 3 tablets (600 mg) by mouth every 6 (six) hours as needed for Pain     Jublia 10 % Soln  Generic drug: Efinaconazole     lidocaine-prilocaine cream  Commonly known as: EMLA  Apply topically as needed Dermatology procedures     ondansetron 4 MG disintegrating tablet  Dose: 4 mg  Commonly known as: ZOFRAN-ODT  TAKE 1 TABLET (4 MG) BY MOUTH EVERY 8 (EIGHT) HOURS AS NEEDED FOR NAUSEA     Otezla 30 MG Tabs  Dose: 1 tablet  Generic drug: Apremilast  Take 1 tablet (30 mg) by mouth 2 (two) times daily     predniSONE 20 MG tablet  Dose: 40 mg  Commonly known as: DELTASONE  Take 2 tablets (40 mg) by mouth every morning with breakfast for 5 days     Rimegepant Sulfate 75 MG Tbdp  Dose: 75 mg  For: Migraine Headache  Take 1 tablet (75 mg) by mouth every other day For migraine prevention     rizatriptan 10 MG tablet  Dose: 10-20 mg  Commonly known as: MAXALT  Take 1-2 tablets (10-20 mg total) by mouth once as needed for Migraine Max 3 days a week     valACYclovir 1000 MG tablet  Commonly known as: VALTREX  Take 2 tabs at onset; repeat once in 12 hours.  Total 4 tabs per outbreak            STOP taking  these medications      Adalimumab 40 MG/0.4ML Pnkt     doxycycline 100 MG capsule  Commonly known as: VIBRAMYCIN     EPINEPHrine 0.3 MG/0.3ML Soaj injection     MULTIVITAMIN ADULT PO     Omega-3 1000 MG Caps     vitamin D 25 MCG (1000 UT) tablet  Commonly known as: CHOLECALCIFEROL            Minutes spent coordinating discharge and reviewing discharge plan: 90 minutes      Signed by: Irma Newness, MD

## 2022-11-28 ENCOUNTER — Telehealth: Payer: Self-pay

## 2022-11-30 ENCOUNTER — Encounter (INDEPENDENT_AMBULATORY_CARE_PROVIDER_SITE_OTHER): Payer: BLUE CROSS/BLUE SHIELD | Admitting: Obstetrics and Gynecology

## 2022-12-01 ENCOUNTER — Ambulatory Visit (INDEPENDENT_AMBULATORY_CARE_PROVIDER_SITE_OTHER): Payer: BLUE CROSS/BLUE SHIELD | Admitting: Clinical Cardiac Electrophysiology

## 2022-12-05 ENCOUNTER — Other Ambulatory Visit (INDEPENDENT_AMBULATORY_CARE_PROVIDER_SITE_OTHER): Payer: Self-pay | Admitting: Internal Medicine

## 2022-12-05 DIAGNOSIS — M0600 Rheumatoid arthritis without rheumatoid factor, unspecified site: Secondary | ICD-10-CM

## 2022-12-05 DIAGNOSIS — M352 Behcet's disease: Secondary | ICD-10-CM

## 2022-12-05 NOTE — Telephone Encounter (Signed)
Rep  Charleen from [facility] called states pharmacy accidentally canceled patients medication  Adalimumab 40  They are requesting to have a new order faxed to them .  Phone [phone]  Fax: [phone]

## 2022-12-06 ENCOUNTER — Ambulatory Visit: Payer: BLUE CROSS/BLUE SHIELD | Attending: No Specialty | Admitting: No Specialty

## 2022-12-06 ENCOUNTER — Encounter (INDEPENDENT_AMBULATORY_CARE_PROVIDER_SITE_OTHER): Payer: Self-pay | Admitting: Family Medicine

## 2022-12-06 ENCOUNTER — Ambulatory Visit (INDEPENDENT_AMBULATORY_CARE_PROVIDER_SITE_OTHER): Payer: BLUE CROSS/BLUE SHIELD | Admitting: Family Medicine

## 2022-12-06 ENCOUNTER — Encounter: Payer: Self-pay | Admitting: No Specialty

## 2022-12-06 VITALS — BP 124/86 | HR 106

## 2022-12-06 VITALS — BP 132/89 | HR 103 | Temp 97.8°F

## 2022-12-06 DIAGNOSIS — Z79899 Other long term (current) drug therapy: Secondary | ICD-10-CM

## 2022-12-06 DIAGNOSIS — M545 Low back pain, unspecified: Secondary | ICD-10-CM

## 2022-12-06 DIAGNOSIS — G43909 Migraine, unspecified, not intractable, without status migrainosus: Secondary | ICD-10-CM | POA: Insufficient documentation

## 2022-12-06 DIAGNOSIS — D84821 Immunodeficiency due to drugs: Secondary | ICD-10-CM

## 2022-12-06 DIAGNOSIS — G43709 Chronic migraine without aura, not intractable, without status migrainosus: Secondary | ICD-10-CM

## 2022-12-06 DIAGNOSIS — M352 Behcet's disease: Secondary | ICD-10-CM

## 2022-12-06 DIAGNOSIS — R0602 Shortness of breath: Secondary | ICD-10-CM

## 2022-12-06 MED ORDER — ONABOTULINUMTOXINA 200 UNITS IJ SOLR
155.0000 [IU] | Freq: Once | INTRAMUSCULAR | Status: AC
Start: 2022-12-06 — End: 2022-12-06
  Administered 2022-12-06: 155 [IU] via INTRAMUSCULAR

## 2022-12-06 MED ORDER — ALBUTEROL SULFATE HFA 108 (90 BASE) MCG/ACT IN AERS
2.0000 | INHALATION_SPRAY | RESPIRATORY_TRACT | 0 refills | Status: DC | PRN
Start: 2022-12-06 — End: 2023-01-28

## 2022-12-06 MED ORDER — PREDNISONE 20 MG PO TABS
ORAL_TABLET | ORAL | 0 refills | Status: DC
Start: 2022-12-06 — End: 2023-01-23

## 2022-12-06 MED ORDER — CYCLOBENZAPRINE HCL 10 MG PO TABS
10.0000 mg | ORAL_TABLET | Freq: Three times a day (TID) | ORAL | 0 refills | Status: DC | PRN
Start: 2022-12-06 — End: 2023-01-28

## 2022-12-06 MED ORDER — HUMIRA (2 SYRINGE) 40 MG/0.4ML SC PSKT
40.0000 mg | PREFILLED_SYRINGE | SUBCUTANEOUS | 3 refills | Status: DC
Start: 2022-12-06 — End: 2023-01-28

## 2022-12-06 NOTE — Telephone Encounter (Signed)
done

## 2022-12-06 NOTE — Telephone Encounter (Signed)
Please advise, thank you!    LOV: 11/22/2022  (indicates patient takes Humira every 7 days)  F/up: 02/08/23

## 2022-12-06 NOTE — Progress Notes (Signed)
1/30/[name]      CC: migraine    History was obtained from patient   History of Present Illness:  Hailey [name] is a 42 y.o. 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     Hailey [name]:   She presents for Botox injections. Headaches remain wel controlled. She reports only 2 breakthrough headache days in [name].       Notes reviewed: Admission for viral illness in Hailey [name]  Last injections Sep 12, 2022.     Mar 15 2022  Headache hx: since college  Location:bilateral and band like and left eye  Described as sharp and throbbing  Occurs:>15 HA days per month  Duration:>4 hours  Intensity: moderate to severe  Aura:none  Associated with photo/phonophobia, nausea and dizziness  Denies focal motor/sensory or visual changes.   No positional or autonomic symptoms  Exacerbating factors:unknown  Alleviating factors:unknown  Sleep:OK  Mood:high, working in coping mechanisms    Past medications tried:  Herbalist  Rizatriptan  imitrex  buspar,   Topiramate: SE  reglan,   Propranolol ineffective  hydroxyzine        Past Medical History:  Past Medical History:   Diagnosis Date    Anxiety     Arrhythmia     WPW ablation in 2005, PVC ablation 8/23    Behcet's disease     She sees Dr. Lauro Regulus    BMI 45.0-49.9, adult     Fever 01/03/[name]    Headache     migraine    Hx of ovarian cyst     Lower back pain     Meningitis 2011    Viral // treated and resolved    Pain in wrist 01/03/[name]    Post-operative nausea and vomiting     Scoliosis     moderate// used a Brace at a young age       Past Surgical History:  Past Surgical History:   Procedure Laterality Date    ABLATION - PVC'S N/A 06/08/2022    Procedure: Ablation - PVC's;  Surgeon: Jaynie Bream, MD;  Location: [facility];  Service: Cardiovascular;  Laterality: N/A;  SAME DAY DISCHARGE  CARTO NO TEE    ARTHROTOMY, WRIST Right 12/03/2020    Procedure: RIGHT UPPER EXTREMITY SYNOVIAL BIOPSY;  Surgeon: Lenox Ahr, MD;  Location: [facility];  Service: Plastics;  Laterality: Right;    BIOPSY, LYMPH NODE N/A 07/23/2018    Procedure: BIOPSY, LYMPH NODE;  Surgeon: Neldon Labella, MD;  Location: [facility];  Service: ENT;  Laterality: N/A;  NECK DEEP LYMPH NODE BIOPSY    BONE MARROW BIOPSY  2020    led to behcet's dx    CARDIAC ABLATION  2005    [facility] for [facility]ff[facility]    CHOLECYSTECTOMY  2009    [facility] DIAGNOSTIC  2010    DILATION AND CURETTAGE OF UTERUS  2010    FINGER GANGLION CYST EXCISION  04/2018    GANGLION CYST EXCISION  2014    04/2018 cyst removed from index finger-[facility]  2016    HYSTERECTOMY  2012    HYSTEROSCOPY  2011    indication: Abnormal bleeding    LAPAROSCOPIC, LYSIS, ADHESIONS N/A 1/3/[name]    Procedure: LAPAROSCOPIC LYSIS OF ADHESIONS;  Surgeon: Lindell Noe, MD;  Location: [facility];  Service: Gynecology;  Laterality: N/A;    LAPAROSCOPIC, SALPINGECTOMY Left  1/3/[name]    Procedure: LAPAROSCOPIC, SALPINGECTOMY LEFT;  Surgeon: Lindell Noe, MD;  Location: [facility];  Service: Gynecology;  Laterality: Left;    LAPAROSCOPY, DIAGNOSTIC N/A 1/3/[name]    Procedure: LAPAROSCOPY, DIAGNOSTIC;  Surgeon: Lindell Noe, MD;  Location: [facility];  Service: Gynecology;  Laterality: N/A;  [id number]    UMBILICAL HERNIA REPAIR  2016    UPPER GASTROINTESTINAL ENDOSCOPY  2009    WISDOM TOOTH EXTRACTION  1998       Allergies:  Fruit blend flavor [flavoring agent], Cyanoacrylate, Latex, Adhesive [wound dressing adhesive], Other, and Oxycodone    Family History:  Family History   Adopted: Yes     No other family history related to current complaints.    Social History:  Social History     Tobacco Use    Smoking status: Never    Smokeless tobacco: Never   Vaping Use    Vaping Use: Never used   Substance Use Topics    Alcohol use: Not Currently     Alcohol/week: 0.0 standard drinks of alcohol     Comment: rarely    Drug use: Never       Medications:  Current Outpatient  Medications   Medication Sig Dispense Refill    Adalimumab (Humira) 40 MG/0.4ML Prefilled Syringe Kit Inject 0.4 mLs (40 mg) into the skin every 10 (ten) days 6 each 3    albuterol sulfate HFA (PROVENTIL) 108 (90 Base) MCG/ACT inhaler Inhale 2 puffs into the lungs every 4 (four) hours as needed for Shortness of Breath 1 each 0    Apremilast (Otezla) 30 MG Tab Take 1 tablet (30 mg) by mouth 2 (two) times daily 180 tablet 1    Azelastine-Fluticasone 137-50 MCG/ACT Suspension 1 spray by Nasal route 2 (two) times daily      botulinum Toxin Type A (Botox) 200 units Recon Soln injection Inject 155 units IM every 12 weeks 1 each 3    busPIRone (BUSPAR) 5 MG tablet TAKE 1 TABLET (5 MG) BY MOUTH 3 (THREE) TIMES DAILY AS NEEDED (ANXIETY) 270 tablet 0    clindamycin (CLEOCIN T) 1 % lotion Apply topically every morning      colesevelam (WELCHOL) 625 MG tablet Take 2 tablets (1,250 mg) by mouth every morning      cyclobenzaprine (FLEXERIL) 10 MG tablet Take 1 tablet (10 mg) by mouth 3 (three) times daily as needed for Muscle spasms 30 tablet 0    Dapsone 7.5 % Gel Apply topically every morning      hydrOXYzine (ATARAX) 10 MG tablet Take 1 tablet (10 mg) by mouth nightly 90 tablet 3    ibuprofen (ADVIL) 200 MG tablet Take 3 tablets (600 mg) by mouth every 6 (six) hours as needed for Pain 30 tablet 1    Jublia 10 % Solution       lidocaine-prilocaine (EMLA) cream Apply topically as needed Dermatology procedures      ondansetron (ZOFRAN-ODT) 4 MG disintegrating tablet TAKE 1 TABLET (4 MG) BY MOUTH EVERY 8 (EIGHT) HOURS AS NEEDED FOR NAUSEA 30 tablet 2    predniSONE (DELTASONE) 20 MG tablet Take 3 tabs daily x 7d, 2 tabs daily x 7d, 1 tab daily x 7d, 1/2 tab daily x 7d, then off 46 tablet 0    Rimegepant Sulfate 75 MG Tablet Dispersible Take 1 tablet (75 mg) by mouth every other day For migraine prevention 16 tablet 11    rizatriptan (MAXALT) 10 MG tablet Take 1-2 tablets (  10-20 mg total) by mouth once as needed for Migraine Max 3  days a week 36 tablet 3    valACYclovir (VALTREX) 1000 MG tablet Take 2 tabs at onset; repeat once in 12 hours.  Total 4 tabs per outbreak 12 tablet 3     No current facility-administered medications for this visit.       General Exam:  BP 124/86 (BP Site: Left arm, Patient Position: Sitting, Cuff Size: Large)   Pulse (!) 106   LMP  (LMP Unknown)   SpO2 96%   Respiratory rate wnl.   Gen:  Well-developed, well-nourished.  No acute distress.  Cooperative with exam.  HEENT:  NC/AT. No icterus or conjunctival hemorrhage noted.  Neck: Full range of motion.    Lungs: No audible wheezing or dyspnea at rest.   Skin:  No obvious rashes or lesions.   Extrem:  No obvious edema noted in hands.     NEUROLOGICAL EXAM    Mental Status: Awake, alert, oriented. Speech fluent without dysarthria. Follows commands well. Attention, memory, and concentration intact. Fund of knowledge appropriate for education.     Cranial Nerves:   Extraocular movements are full. No nystagmus seen. Facial sensation to light touch appears intact. Face is symmetric. Hearing is intact to conversational speech. Cranial nerves II-XII otherwise appears intact.     Motor: Moves all extremities well    Coordination:  No truncal ataxia.    Gait: Normal and steady.      Investigations:      Imaging:  MRI Brain W WO Contrast    Result Date: 04/30/2021  Impression:  1. No intracranial abnormality is detected. MR imaging of the brain is stable with 08/13/2020. 2. There is no lesion within the spinal cord. 3. Mild cervical, thoracic and lumbar spondylosis as discussed above. Electronically signed by: Candace Cruise D.O.  [Interpreted at: [facility] BG: 04/30/21    MRI Cervical Spine W WO Contrast    Result Date: 04/30/2021  Impression:  1. No intracranial abnormality is detected. MR imaging of the brain is stable with 08/13/2020. 2. There is no lesion within the spinal cord. 3. Mild cervical, thoracic and lumbar spondylosis as discussed  above. Electronically signed by: Candace Cruise D.O.  [Interpreted at: [facility] BG: 04/30/21    MRI Lumbar Spine W WO Contrast    Result Date: 04/30/2021  Impression:  1. No intracranial abnormality is detected. MR imaging of the brain is stable with 08/13/2020. 2. There is no lesion within the spinal cord. 3. Mild cervical, thoracic and lumbar spondylosis as discussed above. Electronically signed by: Candace Cruise D.O.  [Interpreted at: [facility]] [place] BG: 04/30/21    MRI Thoracic Spine W WO Contrast    Result Date: 04/30/2021  Impression:  1. No intracranial abnormality is detected. MR imaging of the brain is stable with 08/13/2020. 2. There is no lesion within the spinal cord. 3. Mild cervical, thoracic and lumbar spondylosis as discussed above. Electronically signed by: Candace Cruise D.O.  [Interpreted at: [facility]] [place] BG: 04/30/21          Procedure: Chemodeneveration.   Indication: Chronic migraine     Discussed Botox procedure, answered questions, consent is in the chart.    One  vial of 200 units of Botox was diluted,  with 4  mL of 0.9% NS. Using a 30 gauge half-inch needle, the following muscles were injected with the following doses:  Bilateral corrugator: 10 units divided in 2 sites  Procerus: 5 units in 1 site  Frontalis: 20 units divided in 4 sites  Temporalis: 40 units divided in 8 sites   Occipitalis: 30 units divided in 6 sites  Cervical paraspinals: 20 units divided in 4 sites  Trapezius: 30 units divided in 6 sites   A total of 155 units of botulinum toxin A was administered, divided in 31 sites.   There was 45 units waste.   The patient tolerated procedure well and No other adverse effects immediately noted.  LOT TV:[id number]  Exp 04/2025    Evette Cristal, MD  [facility]        Recommendations:  There are no diagnoses linked to this encounter.      Hailey [name] is a 42 y.o. female  with Behcet's and seronegative arthritis and WPW who is seen for chronic migraine.     Tolerated Botox well today     Plan:   Repeat Botox in 12 weeks    Continue Nurtec 75mg  every other day    Continue rizatriptan PRN abortive          Referring (see communications section)      The patient should seek emergent care if there is any change in the symptoms. Proper use and all side effects of medications discussed    Discussed the importance of sleep hygiene, maintaining appropriate hydration, avoid overuse of caffeine and OTC medications for headache lifestyle modification      Please contact me with any questions. Patients and [facility] Providers can reach me via [place].    Evette Cristal, MD  [facility]  : [phone]  [facility]: [phone]    *DISCLAIMER: This note was generated by the Epic EMR system/ Dragon speech recognition and may contain inherent errors or omissions not intended by the user. Grammatical errors, random word insertions, deletions, pronoun errors and incomplete sentences are occasional consequences of this technology due to software limitations. Not all errors are caught or corrected. If there are questions or concerns about the content of this note or information contained within the body of this dictation they should be addressed directly with the author for clarification.*

## 2022-12-06 NOTE — Progress Notes (Signed)
Have you seen any specialists/other providers since your last visit with [place]?    Yes, rheumatology      The patient was informed that the following HM items are still outstanding:   There are no preventive care reminders to display for this patient.

## 2022-12-06 NOTE — Progress Notes (Signed)
[facility] OFF[facility] VISIT           HPI  Hailey [name]  is a 42 y.o.  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 She was admitted to the hospital recently, from 1/17-1/19 for symptoms thought to be a viral sinus infection (still treated with course of doxy and 5 day course of prednisone).  Of note, she is also recovering from removal of ovarian cyst, left salpingectomy and lysis of adhesions earlier this month, but feels like from a post-op perspective, she is doing much better.    She does report improvement in her symptoms initially following discharge, but as she completed the steroids and abx, notes that her back pain has started coming back and worsened over the last few days. At this point, she feels similar to how she felt prior to her diagnosis of Behcet's when her symptoms were much less controlled. Does report having missed 2 doses of Humira (due to her acute illness) and has been speaking with her rheumatologist about next steps.    Review of Systems   Constitutional:  Positive for fatigue and fever. Negative for chills.   HENT:  Negative for hearing loss.    Eyes:  Negative for visual disturbance.   Respiratory:  Positive for cough and shortness of breath.    Cardiovascular:  Negative for chest pain.   Gastrointestinal:  Positive for abdominal pain.   Genitourinary:  Negative for dysuria.   Musculoskeletal:  Positive for back pain.   Allergic/Immunologic: Positive for immunocompromised state.   Neurological:  Negative for numbness.   Psychiatric/Behavioral:  Negative for behavioral problems.         BP 132/89 (BP Site: Left arm, Patient Position: Sitting, Cuff Size: Large)   Pulse (!) 103   Temp 97.8 F (36.6 C) (Temporal)   LMP  (LMP Unknown)   SpO2 96%    Physical Exam  Constitutional:       Appearance: She is obese.   HENT:      Right Ear: Tympanic membrane and ear canal normal.      Left Ear: Tympanic membrane and ear  canal normal.      Mouth/Throat:      Mouth: Mucous membranes are moist.      Pharynx: Oropharynx is clear. No oropharyngeal exudate or posterior oropharyngeal erythema.   Eyes:      Pupils: Pupils are equal, round, and reactive to light.   Cardiovascular:      Rate and Rhythm: Normal rate and regular rhythm.      Pulses: Normal pulses.      Heart sounds: Normal heart sounds.   Pulmonary:      Effort: Pulmonary effort is normal.      Breath sounds: Normal breath sounds.   Abdominal:      Palpations: Abdomen is soft.      Tenderness: There is abdominal tenderness (Over sutures). There is no guarding.   Musculoskeletal:         General: Tenderness (Lower back pain (L3/L4 level)) present. Normal range of motion.      Cervical back: Normal range of motion and neck supple. No rigidity.      Lumbar back: Negative right straight leg raise test and negative left straight leg raise test.   Skin:     General: Skin is warm and dry.   Neurological:      General: No focal deficit present.  Mental Status: She is alert and oriented to person, place, and time.   Psychiatric:         Mood and Affect: Mood normal.         Behavior: Behavior normal.          1. Acute midline low back pain without sciatica  - S/p 5 day course of prednisone with some improvement initially but pain is uncontrolled. Will start pt back on prednisone and start on a longer taper.  - Will reach out to pt rheumatologist to ask about plan for starting pt back on her biologic (had missed 2 doses due to this hospitalization and acute illness). For now, plan will be to keep her on steroids until we clarify the next steps.  - predniSONE (DELTASONE) 20 MG tablet; Take 3 tabs daily x 7d, 2 tabs daily x 7d, 1 tab daily x 7d, 1/2 tab daily x 7d, then off  Dispense: 46 tablet; Refill: 0  - cyclobenzaprine (FLEXERIL) 10 MG tablet; Take 1 tablet (10 mg) by mouth 3 (three) times daily as needed for Muscle spasms  Dispense: 30 tablet; Refill: 0    2. Behcet's disease  -  Managed by rheum  - As above    3. Immunocompromised state due to drug therapy  - As above    4. Shortness of breath  - May be multifactorial, related to pt's current uncontrolled pain (splinting), body habitus. Albuterol appears to be helping, will continue for now, but suspect that once her acute issues are under control, this should improve.  - albuterol sulfate HFA (PROVENTIL) 108 (90 Base) MCG/ACT inhaler; Inhale 2 puffs into the lungs every 4 (four) hours as needed for Shortness of Breath  Dispense: 1 each; Refill: 0      Return if symptoms worsen or fail to improve.

## 2022-12-06 NOTE — Progress Notes (Signed)
Botox 200 units witness by DR.Kastl Botox 200 units mixed with 2 ml N/S  draw in each  1 ml syringe and 30 G. Needle order read back and verified to DR.Kastl.

## 2022-12-07 NOTE — Telephone Encounter (Addendum)
Patient Called to speak with a Nurse to  Clarify to Usage Directions which should be every (7) days instead of every (10). Patient also states that it was not supposed to be in the form of a prefilled Syringed but Pen Injector Please provide a call back

## 2022-12-07 NOTE — Telephone Encounter (Addendum)
Pharmacy requesting Clarification of Humira Rx and patient requesting Pen injectors instead of pre-filled syringes.    Added Humira Pen-injector kit to patient's Medication list for provider review; also would you like patient to do injection every 7 days?    Thank you!     Disp Refills Start End    Adalimumab (Humira) 40 MG/0.4ML Prefilled Syringe Kit 6 each 3 12/06/2022 --    Sig - Route: Inject 0.4 mLs (40 mg) into the skin every 10 (ten) days - Subcutaneous    Sent to pharmacy as: Adalimumab (Humira) 40 MG/0.4ML Prefilled Syringe Kit    Class: E-Rx    E-Prescribing Status: Receipt confirmed by pharmacy (12/07/2022 10:22 AM EST)

## 2022-12-07 NOTE — Addendum Note (Signed)
Addended by: Lauralyn Primes. on: 12/07/2022 04:18 PM     Modules accepted: Orders

## 2022-12-08 ENCOUNTER — Telehealth: Payer: Self-pay

## 2022-12-08 NOTE — Telephone Encounter (Signed)
[employer]:     PA renewal via [employer]; (Key: [id number]). PAQs and attached clinical notes-waiting on determination within 24-48 hours    PA approved; effective 11/07/22-05/31/24.  Patient insurance lockout-fills with [facility] pharmacy

## 2022-12-12 MED ORDER — HUMIRA (2 PEN) 40 MG/0.4ML SC PNKT
40.0000 mg | PEN_INJECTOR | SUBCUTANEOUS | 1 refills | Status: DC
Start: 2022-12-12 — End: 2023-01-28

## 2022-12-13 ENCOUNTER — Encounter (INDEPENDENT_AMBULATORY_CARE_PROVIDER_SITE_OTHER): Payer: Self-pay | Admitting: Internal Medicine

## 2022-12-13 ENCOUNTER — Telehealth (INDEPENDENT_AMBULATORY_CARE_PROVIDER_SITE_OTHER): Payer: Self-pay

## 2022-12-13 NOTE — Telephone Encounter (Signed)
Faxed Rx Clarification to [facility] (fax [phone]) for the following medication:  Adalimumab (Humira, 2 Pen,) 40 MG/0.4ML Pen-injector    Indicated on return fax that Humira is to be administered every 7 days and dosage form is pen-injector    Scanned Rx Clarification to Chart

## 2022-12-14 ENCOUNTER — Encounter (INDEPENDENT_AMBULATORY_CARE_PROVIDER_SITE_OTHER): Payer: Self-pay | Admitting: Family Medicine

## 2022-12-14 ENCOUNTER — Other Ambulatory Visit (INDEPENDENT_AMBULATORY_CARE_PROVIDER_SITE_OTHER): Payer: Self-pay | Admitting: Family Medicine

## 2022-12-14 DIAGNOSIS — M545 Low back pain, unspecified: Secondary | ICD-10-CM

## 2022-12-15 ENCOUNTER — Ambulatory Visit: Payer: BLUE CROSS/BLUE SHIELD

## 2022-12-15 NOTE — Progress Notes (Signed)
12/15/22 0900   Pain History   Pain Symptoms Pain   Pain Location Lower Back;Hip  Right;Hip  Left;Buttock  Right;Buttock  Left   Pain Description Spasms/Cramping;Sharp/Stabbing;Radiating;Acute (<3 months)   Pain Frequency Intermittent   Sensation Symptoms Weakness   Sensation Location Lower Back;Hip  Right;Hip  Left;Buttock  Right;Buttock  Left   Sensation Description Tightness   Sensation Frequency Intermittent   Foot Drop No   Incontinence No   Specific event causing symptoms? Other   Treatments   Have you visited a chiropractor for this problem? No   Have you ever had physical therapy for this problem? No   Have you ever received an injection for this problem? No   Care Management   How did you hear about our program MD Referral   ISP Appointment   ISP Physician Arcelia Jew   ISP Appointment Date 12/16/22  (8:30AM)   ISP Appointment Location [facility]

## 2022-12-16 ENCOUNTER — Other Ambulatory Visit: Payer: Self-pay | Admitting: Orthopaedic Surgery

## 2022-12-16 ENCOUNTER — Ambulatory Visit
Admission: RE | Admit: 2022-12-16 | Discharge: 2022-12-16 | Disposition: A | Discharge status: Home / Self Care | Payer: BLUE CROSS/BLUE SHIELD | Source: Ambulatory Visit | Attending: Orthopaedic Surgery | Admitting: Orthopaedic Surgery

## 2022-12-16 DIAGNOSIS — M5416 Radiculopathy, lumbar region: Secondary | ICD-10-CM

## 2022-12-22 ENCOUNTER — Encounter: Payer: Self-pay | Admitting: No Specialty

## 2022-12-26 ENCOUNTER — Encounter (INDEPENDENT_AMBULATORY_CARE_PROVIDER_SITE_OTHER): Payer: Self-pay | Admitting: Family Medicine

## 2022-12-28 ENCOUNTER — Encounter (INDEPENDENT_AMBULATORY_CARE_PROVIDER_SITE_OTHER): Payer: Self-pay

## 2022-12-28 ENCOUNTER — Encounter (INDEPENDENT_AMBULATORY_CARE_PROVIDER_SITE_OTHER): Payer: Self-pay | Admitting: Family Medicine

## 2022-12-28 ENCOUNTER — Encounter (INDEPENDENT_AMBULATORY_CARE_PROVIDER_SITE_OTHER): Payer: Self-pay | Admitting: Internal Medicine

## 2022-12-29 ENCOUNTER — Telehealth (INDEPENDENT_AMBULATORY_CARE_PROVIDER_SITE_OTHER): Payer: Self-pay | Admitting: Internal Medicine

## 2022-12-29 ENCOUNTER — Encounter: Payer: Self-pay | Admitting: No Specialty

## 2022-12-29 NOTE — Telephone Encounter (Signed)
Per Dr. Lauro Regulus: Hi Allaja-When you have a chance could you call her and schedule sooner visit-3/1 9:40 am video is fine-need to touch base with her. if she can't do that one., any open video spot is fine. thank you!     Called and scheduled 3/1 at 9:40 vv.

## 2023-01-02 ENCOUNTER — Inpatient Hospital Stay
Payer: BLUE CROSS/BLUE SHIELD | Attending: Orthopaedic Surgery | Admitting: Rehabilitative and Restorative Service Providers"

## 2023-01-02 ENCOUNTER — Encounter: Payer: Self-pay | Admitting: Rehabilitative and Restorative Service Providers"

## 2023-01-02 VITALS — BP 158/97 | HR 91

## 2023-01-02 DIAGNOSIS — M5451 Vertebrogenic low back pain: Secondary | ICD-10-CM | POA: Insufficient documentation

## 2023-01-02 NOTE — Progress Notes (Signed)
Name:Hailey [name] Age: 42 y.o.   Date of Service: 01/02/2023  Referring Physician: Barton Fanny, MD   Date of Injury: No data found 11/09/2022  PT Date Care Plan Established/Reviewed:01/02/2023  PT Date Treatment Started:01/02/2023  OT Date Care Plan Established/Reviewed: No data found  OT Date Treatment Started: No data found    (Historic) Date of Injury:No data was found  (Historic) Date Care Plan Established/Reviewed No data was found  No data was found  (Historic) Date Treatment Started No data was found No data was found    End of Certification Date: [id number]  Sessions in Plan of Care: 24  Surgery Date: No data was found  MD Follow-up: No data was found  Medbridge Code: [facility]    Visit Count: 1   Diagnosis:    Diagnosis ICD-10-CM Associated Order   1. Vertebrogenic low back pain  M54.51           Subjective     History of Present Illness   Mechanism of injury: Coughing  History of Present Illness: Pt reports that she had laparoscopic surgery on November 09, 2022 and shortly after that she got a respiratory infection that contributed to a lot of coughing. Pt states that all the coughing may have caused her to throw her back out so it would spasm uncontrollably. She went to her PCP who gave her a course of prednisone which didn't help much at all. Then she was referred to an orthopedic specialist who took imaging and told her it's likely a combination of degenerative changes and scoliosis. Pt has a history of scoliosis since she was a child, so she's had back pain. But this pain was unrelenting. Her pain levels have died down quite a bit and doesn't have any spasms, just more low level pain that she feels can turn into a spasm at any time with certain movements or prolonged activity. Denies radicular pain. She no longer takes muscle relaxers and will be stopping her prednisone this Wednesday.       Pain description: Discomfort/pain in the lower back. Tightness. Not constant because no pain with rest.  Starts in the lower back and wraps around to the front of the right hip.   Aggravating factors: Picking things up from the floor, bending over, laying down flat, laying on left side, lifting >5 pounds,   Relieving factors: Heating pad, ice pack, sitting in a recliner  Functional Limitations (PLOF): Difficulty with bending over to pick things up from the floor due to lumbar pain (No limitations bending to pick up objects)  Difficulty laying flat on her back due to pain in the lumbar (no difficulty sleeping flat on her back).   Difficulty with lifting >5 lbs due to pain in the lumbar. (No trouble with lifting groceries/>25lbs).      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 Lauro [name]    BMI 45.0-49.9, adult     Fever 11/09/2022    Headache     migraine    Hx of ovarian cyst     Lower back pain     Meningitis 2011    Viral // treated and resolved    Pain in wrist 11/09/2022    Post-operative nausea and vomiting     Scoliosis     moderate// used a Brace at a young age       Objective     Posture     Head  Forward.    Shoulders  Rounded.    Functional Strength   Sit to Stand: UE assist  Squat: partial (painful)    Balance   Left   Eyes Open (Left)(sec)      Trial 1: 30 seconds  Right   Eyes Open (Right)(sec)      Trial 1: 15 seconds    Gait   Gait Speed: Slow and antalgic  Left Side: decreased heel strike decreased hip extension decreased stance  phase  Right Side: decreased hip extension decreased heel strike decreased stance phase    Integumentary   no wound, lesion or rash noted    Active Range of Motion (degrees)     Lumbar   Extension: 10 (feels shaky) degrees   Flexion functional reach: Ankle (Pain with standing from bent position)  Left rotation: 45 (tight, not painful) degrees   Right rotation: 30 degrees with pain  Left lateral flexion functional reach: Proximal Thigh with pain  Right lateral flexion functional reach: Proximal Thigh with pain    Strength/Myotome Testing (/5)     Left Hip   Planes of Motion   Flexion (L1/2): 4  Extension: 4+  Abduction: 3+    Right Hip   Planes of Motion   Flexion (L1/2): 4 (slightly painful)  Extension: 4+  Abduction: 3+    Palpation Significant hypertonicity and tenderness to right lumbar paraspinals, QL, glute med/piriformis    Tests       Thoracic   Negative slump.   Lumbar/Pelvic Girdle/Sacrum   Negative slump.    Left Hip   Negative FABER.     Right Hip   Negative FABER.     Neurological Testing     Sensation     Lumbar   Left   Intact: light touch    Right   Intact: light touch             BP: (!) 158/97 (MD aware) Heart Rate: 91    Treatment     Therapeutic Exercises - Justified to address any of the following:  To develop strength, endurance, ROM and/or flexibility.   See HEP; Exercises were performed under supervision of PT.      Home Exercises   Access Code: [facility]  URL: [email or web address]  Date: 01/02/2023  Prepared by: Hailey [name]    Exercises  - Supine Lower Trunk Rotation  - 1 x daily - 7 x weekly - 3 sets - 10 reps  - Hooklying Single Knee to Chest Stretch with Towel  - 1 x daily - 7 x weekly - 3 sets - 30 seconds hold  - Seated Piriformis Stretch  - 1 x daily - 7 x weekly - 3 sets - 30 seconds hold       ---      Flowsheet  Row ---   Total Time    Timed Minutes 9 minutes   Untimed Minutes 35 minutes   Total Time 44 minutes          Assessment   Hailey [name] is a 42 y.o. female  presenting with lumbar pain consistent with poor core strength and muscle strain who requires skilled Physical Therapy services.    Clinical presentation: stable - local pain without causing other issues/symptoms  Barriers to therapy: Comorbidities - Behcet's disease    Impairments: Pain that limits and interferes with functional ability  Impaired postural alignment  Decreased range of motion  Decreased strength  Decreased functional stability  Decreased soft tissue mobility  Decreased coordination  Decreased static balance  Functional Limitations (PLOF): Difficulty with bending over to pick things up from the floor due to lumbar pain (No limitations bending to pick up objects)  Difficulty laying flat on her back due to pain in the lumbar (no difficulty sleeping flat on her back).   Difficulty with lifting >5 lbs due to pain in the lumbar. (No trouble with lifting groceries/>25lbs).    Prognosis: excellent  Patient is aware of diagnosis, prognosis and consents to plan of care: Yes  Plan   Visits per week: 2  Number of Sessions: 24  Direct One on One  97110: Therapeutic Exercise: To Develop Strength and Endurance, ROM and Flexibility  [id number]: Gait Training  [phone]: Neuromuscular Reeducation (Proprioceptive Neuromuscular Facilitation)  97140: Manual Therapy techniques (mobilization, manipulation, manual traction) (Grade I-V to lumbar spine, pelvic girdle, and regionally interdependent joints, soft tissue mobilization, instrument assisted soft tissue mobilization.)  97530: Therapeutic Activities: Dynamic activities to improve functional performance  Dry Needling  Plan for Next Session -: core strengthening, soft tissue work for pain reduction.       Goals      Goal 1: STG: Pt will increase lumbar extension to 15 deg in order to reach behind her or lay supine without pain in the lumbar.   Sessions: 6      Goal 2: Patient will demonstrate independence in prescribed HEP with proper form, sets and reps for safe discharge to  an independent program.     Sessions: 24      Goal 3: Pt will increase FOTO score from 51% to 66% to demonstrate return to function with PT intervention prior to discharge.      Sessions: 24      Goal 4: Pt will demonstrate increased lumbar flexion to the floor in order to bend over enough to lift objects off the floor.    Sessions: 24          Goal 5: Pt will increase hip abduction strength >4+/5 in order to lift and carry groceries into the house.    Sessions: [place], DPT

## 2023-01-03 ENCOUNTER — Encounter: Payer: Self-pay | Admitting: Rehabilitative and Restorative Service Providers"

## 2023-01-03 NOTE — Addendum Note (Signed)
Addended by: Marjorie Smolder on: 01/03/2023 02:53 PM     Modules accepted: Orders

## 2023-01-06 ENCOUNTER — Telehealth: Payer: Self-pay

## 2023-01-06 ENCOUNTER — Encounter (INDEPENDENT_AMBULATORY_CARE_PROVIDER_SITE_OTHER): Payer: Self-pay | Admitting: Internal Medicine

## 2023-01-06 ENCOUNTER — Inpatient Hospital Stay: Payer: BLUE CROSS/BLUE SHIELD | Attending: Orthopaedic Surgery

## 2023-01-06 ENCOUNTER — Encounter: Payer: Self-pay | Admitting: Internal Medicine

## 2023-01-06 ENCOUNTER — Telehealth (INDEPENDENT_AMBULATORY_CARE_PROVIDER_SITE_OTHER): Payer: Self-pay

## 2023-01-06 ENCOUNTER — Telehealth (INDEPENDENT_AMBULATORY_CARE_PROVIDER_SITE_OTHER): Payer: BLUE CROSS/BLUE SHIELD | Admitting: Internal Medicine

## 2023-01-06 DIAGNOSIS — M0600 Rheumatoid arthritis without rheumatoid factor, unspecified site: Secondary | ICD-10-CM | POA: Insufficient documentation

## 2023-01-06 DIAGNOSIS — H209 Unspecified iridocyclitis: Secondary | ICD-10-CM

## 2023-01-06 DIAGNOSIS — M352 Behcet's disease: Secondary | ICD-10-CM

## 2023-01-06 DIAGNOSIS — R569 Unspecified convulsions: Secondary | ICD-10-CM

## 2023-01-06 DIAGNOSIS — K760 Fatty (change of) liver, not elsewhere classified: Secondary | ICD-10-CM

## 2023-01-06 DIAGNOSIS — M5451 Vertebrogenic low back pain: Secondary | ICD-10-CM

## 2023-01-06 DIAGNOSIS — Z79899 Other long term (current) drug therapy: Secondary | ICD-10-CM

## 2023-01-06 HISTORY — DX: Unspecified convulsions: R56.9

## 2023-01-06 NOTE — PT/OT Therapy Note (Signed)
Name: Hilliary Duro Age: 42 y.o.   Date of Service: 01/06/2023  Referring Physician: Barton Fanny, MD   Date of Injury: No data found 11/09/2022  PT Date Care Plan Established/Reviewed:01/02/2023  PT Date Treatment Started:01/02/2023  OT Date Care Plan Established/Reviewed: No data found  OT Date Treatment Started: No data found    (Historic) Date of Injury:No data was found  (Historic) Date Care Plan Established/Reviewed No data was found  No data was found  (Historic) Date Treatment Started No data was found No data was found    End of Certification Date: [id number]  Sessions in Plan of Care: 24  Surgery Date: No data was found  MD Follow-up: No data was found  Medbridge Code: [facility]    Visit Count: 2   Diagnosis:    Diagnosis ICD-10-CM Associated Order   1. Vertebrogenic low back pain  M54.51              Subjective     Daily Subjective     Patient states that her back hurts on the right side - says that the pain has not been worse.  Says that it hurts worse to lay on her back     Social Support/Occupation    Lives in: multiple level home    Lives with: spouse    Occupation: Forensic psychologist (desk)         Precautions: none at this time  Allergies: Fruit blend flavor [flavoring agent], Cyanoacrylate, Latex, Adhesive [wound dressing adhesive], Other, and Oxycodone    Objective                     Treatment     Therapeutic Exercises - Justified to address any of the following:  To develop strength, endurance, ROM and/or flexibility.     Time taken to receive subjective information     ADDED DKTC with swiss ball x 2'    ADDED 3 way trunk flexion using swiss ball 5" x 12    Discussed (in detail) the purpose all changes (increasing mobility, stability, and strength) for long-term management of symptoms.  Education given on supine to sit transfers (log roll, etc).      Manual Therapy - Justified to address any of the following:    Mobilization of joints and soft tissues, manipulation, manual lymphatic drainage, and/or  manual traction.      STM - (R) lumbar paraspinals in side-lying with pillow prop        ---      Flowsheet Row ---   Total Time    Timed Minutes 38 minutes   Total Time 38 minutes          Assessment     Patient arrived to PT with persistent low back pain (although not worse comparatively) - for this reason, focus of today's treatment was addressing this complaint/improving general spinal mobility.  Soft tissue restrictions assessed - tone/tenderness found along the right thoracic/lumbar paraspinals.  STM implemented in a left side-lying position (with pillow prop) - as needed, pressure modifications were made, however, pain improved with duration.  As a result (using new available range), program was established - added DKTC with swiss ball as well lumbar flexion (x3 way) although no additional complaints were had.  Despite this, patient would continue to benefit from skilled interventions (as community ambulation is negatively impacted by the impairments).    Plan     Assess tolerance for time between sessions/program changes - progress general  mobility/stability when able       Goals      Goal 1: STG: Pt will increase lumbar extension to 15 deg in order to reach behind her or lay supine without pain in the lumbar.   Sessions: 6      Goal 2: Patient will demonstrate independence in prescribed HEP with proper form, sets and reps for safe discharge to an independent program.     Sessions: 24      Goal 3: Pt will increase FOTO score from 51% to 66% to demonstrate return to function with PT intervention prior to discharge.      Sessions: 24      Goal 4: Pt will demonstrate increased lumbar flexion to the floor in order to bend over enough to lift objects off the floor.    Sessions: 24          Goal 5: Pt will increase hip abduction strength >4+/5 in order to lift and carry groceries into the house.    Sessions: [address], LPTA

## 2023-01-06 NOTE — Patient Instructions (Signed)
Dear Lyndon Code ,     Thanks for arranging a video visit with me.    Here are things I'd like you to do following today's visit:  Please review patient information on Infliximab(Remicade)    You may retrieve any orders from this visit in [place] by using these steps:    After signing into Mychart  1) click Messaging  2) click Letters  3) select the Requisition  4) Review and click print    Please contact the clinic at [phone] with any questions and to schedule your follow up visit.    Sincerely,    Tarry Kos, MD  Rheumatologist  [facility]  [address]  [place], [place] [address]  Phone: [phone]  Fax: [phone]

## 2023-01-06 NOTE — Progress Notes (Signed)
RHEUMATOLOGY FOLLOW UP TELEMEDICINE NOTE  Telemedicine Documentation Requirements  Originating site (Patient location): home  Distant site (Provider location): Office  Provider and Title: Dr. Lauro Regulus  Type of Visit: Video Visit  Verbal Consent has been obtained to conduct a telemedicine visit on this date in order to minimize exposure to COVID-19.      PCP: Loletha Grayer, MD    HPI:    This patient is a 42 y.o. year female with Past Medical History of Prior Cholecystectomy, Wold-Parkinson White, Prior Hysterectomy who returns for follow up of Behcet's HLAB51+, recurrent oral ulcers, pathergy, low grade fevers and seronegative arthritis.  -ablation procedure for arrythmia 06/2022  -She is on Humria weekly for inflammatory arthritis from [place] for recurrent aphthous ulcers  -She is on dapsone from Dermatology, clindamycin at night  Dermatologist started her on dapsone-she has had a few scars that are healing-had injections from her dermatologist.    Derm: Dr. Vergie Living      Interval History:  Last visit 11/22/22    Since her last visit-She was on antibiotics-went to the hospital-on IV antibiotics.  She was given rpednisone for respiratory pneumonia and recovery.  Then her low back started bothering her-she saw her PCP.  He gave her prednisone for low back spasms and respiratory issues?  She was on 60 mg, then taper to 40 mg, then 20 mg, then 10 mg.  She was held Humira for about a month until finished antibitiocs about one month.  Then she re-started Humira beginning of [place].      She is doing PT for her low back pain    Last week she started developing double vision-she saw the ophthalmologistanterior uveitis.  She was started on dexamethasone eye drops.    Dr. Mardene Celeste.      She is going to pursue PT for low back pain, muscle spasms.      She is on Humira q 7 days    Otezla 30 mg BID -mouth sores overall better.      She is scheduled for GI evaluation in March 2024.       Labs  Reviewed:  11/25/22  Procalcitonin wnl  CMP wnl  CBC unremarkable      Previous Labs:  08/29/22  CBC platelets 350  BMP unremarkable  ESR wnl  CRP wnl      Imaging Reports Reviewed:  12/16/22  MRI Lumbar Spine:  IMPRESSION:         1.  No acute osseous or soft tissue abnormality.  2.  Normal MR appearance of the distal spinal cord, conus medullaris and  cauda equina.  3.  Rotatory lumbar levoscoliosis.  4.  Lumbar spondylosis and facet arthrosis without central stenosis or  nerve root compression. Please see full details and segmental analysis  above.    CT Chest Angio:  11/23/22  FINDINGS:      LINES/TUBES: None.     LUNGS: No consolidation, edema or mass. There is linear atelectasis at the  lung bases.  PLEURA: No pleural effusions or pneumothorax.     HEART: Not enlarged.  MEDIASTINUM: No axillary, hilar or mediastinal lymphadenopathy.  PULMONARY ARTERIES: No acute pulmonary emboli.  AORTA: No aneurysm or dissection.     UPPER ABDOMEN: Hepatic steatosis. No acute abnormality.     BONES AND SOFT TISSUES: Unremarkable.     IMPRESSION:      No acute pulmonary embolism.    CT A/P  11/23/22:  FINDINGS:  LINES/TUBES: None.     LOWER THORAX: Bibasilar atelectasis.     LIVER/BILIARY TREE: The gallbladder is absent. There is hepatic steatosis.  No biliary dilation.  SPLEEN: No splenomegaly.  PANCREAS: No pancreatic mass or duct dilatation.     KIDNEYS/URETERS: No hydronephrosis, stones or solid mass lesions.  ADRENALS: No adrenal mass.  PELVIC ORGANS/BLADDER: No pelvic masses. The bladder is partially  distended.     PERITONEUM/RETROPERITONEUM: No free air or fluid.  LYMPH NODES: No lymphadenopathy.  VESSELS: No aortic aneurysm.     GI TRACT: No bowel wall thickening or dilation. Normal appendix.     BONES AND SOFT TISSUES: There are degenerative changes in the spine. No  suspicious or destructive osseous lesion.     IMPRESSION:      No acute abnormality.    Previous Imaging:      09/19/22:  ECHO:  Left Ventricle    The  left ventricle is normal in size. There is normal left ventricular  geometry. Left ventricular systolic function is normal with an ejection  fraction by Biplane Method of Discs of  60 %.  3D LVEF 58%. Left ventricular  segmental wall motion is normal. Left ventricular diastolic filling parameters  are consistent with Grade I diastolic dysfunction (impaired relaxation  pattern).  Right Ventricle    The right ventricular cavity size is normal in size. Normal right  ventricular systolic function.    08/27/22  MRI left Hip:  IMPRESSION:      1.Trace left hip joint effusion without significant synovitis. Tear of the  left anterosuperior acetabular labrum. Otherwise unremarkable left hip  joint.     2.Mild to moderate left distal gluteus minimus tendinosis and mild to  moderate left greater trochanteric bursitis with bursal edema. There is  mild left proximal hamstring tendinosis.      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     4.There has been interval enlargement of a left adnexal cyst since pelvic  ultrasound 03/18/2022, now measuring 3.3 cm. This could be further  evaluated with follow-up pelvic ultrasound.     5.Unchanged 3.8 cm cyst adjacent to the vaginal cuff post hysterectomy  since multiple prior exams favoring a benign etiology.      Previous Labs:  07/25/22  ESR 37  CRP wnl  CMP ALt 39  CBC wnl      Imaging Reports Reviewed:  04/12/22  Cardiac MRI:  IMPRESSION:       1. No delayed gadolinium enhancement to suggest myocardial fibrosis or  scarring.  2. Normal right ventricular systolic function.  3. Qualitatively mild global left ventricular hypokinesis. Left ventricular  systolic function measures 32% and may be artifactually reduced secondary  to significant motion artifact.    Labs Reviewed:  04/19/22  Throat culture no B-hemolytic strep  POCT flu A/B  [facility]ot -      Previous Labs:  01/14/22  CMP wnl  CRP wnl  ViT D 31  ESR wnl  Lipid panel wnl  TSH wnl      08/18/21  CBC wnl  CMP  wnl  ESR wnl  CRP wnl    Previous Labs:  05/25/21  Quant Gold-  HepbSAb wnl  HepbSAg-  HepCAb-  HIV-        Previous Labs:  02/23/21  ESR wnl  CRP wnl  CMP ALT 42, AST normal    CRP wnl  ESR wnl  HLAB51 positive  IgE, IgA, IGG, IgM all wnl  CBC wnl    12/03/20  DIAGNOSIS:          A. Lesion, ulnar wrist, excision:     -Fibroadipose tissue and vasculature          B. Synovial tissue, biopsy:     -Fibroadipose tissue and synovium (see comment)          Comment:     Morphologic features typical of rheumatoid arthritis or infection are     not identified.    12/03/20  Bacterial Cultures: No Growth  Fungal Cultures: NG at one week  AFB: No growth    11/20/20  ESR 50  CRP wnl  IgG subclasses wnl  ANA comprehensive panel negative        Previous Labs:    [facility] Labs:  10/23/20  C-ANCA, P-ANCA, MPO, PR3 all negative  Mitochondrial Ab-  ANA IFA-  Scl70-  Centromere-  RNP-  SMith-  TPPA Ab-      05/18/20  Ferritin 291  CMP AST/A:T 59/70  CBC elevated lymphocytes    02/06/20  MPO-  PR3-  SSA-  aCL IgM 14  aCL IgG, IgA -  Lupus anticoagulant -  B2IgG, IgM -  ACE wnl  CCP-  RF-  Vit D 31  TSH wnl  ESR wnl  CK wnl  C4 wnl  C3 wnl  CRP wnl    07/2019  ANA Screen-  LDH wnl    07/23/2018 lymph node biopsy:DIAGNOSIS:          Lymph node, deep right neck, excisional biopsy:     -Lymph node with non-specific reactive changes   Wound, AFB and Fungal cultures negative at this time    BONE MARROW 05/03/2019      BONE MARROW, LEFT POSTERIOR ILIAC CREST, CORE BIOPSY, CLOT SECTION,    ASPIRATE SMEAR AND TOUCH PREP:        - NORMOCELLULAR BONE MARROW (approximate50-60%) WITH TRILINEAGE HEMATOPOIESIS,  NORMAL MYELOID TO ERYTHROID RATIO AND ADEQUATE NUMBERS OF    MEGAKARYOCYTES WIT H UNREMARKABLE MORPHOLOGY        - A FEW SMALL REACTIVE LYMPHOID AGGREGATES IDENTIFIED, AND MILDLY  INCREASED NUMBERS OF EOSINOPHILS        - PENDING CYTOGENETIC STUDIES OF BONE MARROW SAMPLE  - Remain pending as of 05/14/2019.        COMMENT:    The concurrent flow cytometry  performed on the bone marrow aspirate  sample shows no evidence of immunophenotypic abnormalities      Imaging Reviewed:    08/16/20  MRI Brain:IMPRESSION:      1. No intracranial mass, hemorrhage, or hydrocephalus is detected.  2. There is normal pattern of enhancement of the brain parenchyma and  meningeal surfaces.    9/18 MRI Abdomen:  IMPRESSION:      1. Diffuse hepatic steatosis. No suspicious focal hepatic lesions.  2. No biliary ductal dilatation or choledocholithiasis.    02/03/20  MRI Pelvis:  IMPRESSION:         Cystic structure at the vaginal cuff, flank by both ovaries not  significantly changed in size compared to 2019. No abnormal internal  enhancement. This favors a benign process such as a peritoneal inclusion  cyst or a paraovarian cyst.    12/14/19 MRI R Hand:  IMPRESSION:      1.  No suspicious soft tissue mass, osseous mass, or masslike  enhancement in the right hand or wrist to correlate with the diffuse FDG  uptake on recent PET/CT.  2.  There is mild enhancing synovitis in the right wrist joint without  active joint erosion or bony destruction.  3.  A small dorsal ganglion cyst overlying the scapholunate interval.    12/06/19  Full Body PET Scan:    Hypermetabolic soft tissue mass in palmar R hand surface at base of the second digit-stable pulmonary nodulke 1.3 cm left lower lobe-recommend follow up Chest CT 3 months         The following sections were reviewed this encounter by the provider:   Tobacco  Allergies  Meds  Problems  Med Hx  Surg Hx  Fam Hx         PMH/PSH:  Past Medical History:   Diagnosis Date    Anxiety     Arrhythmia     WPW ablation in 2005, PVC ablation 8/23    Behcet's disease     She sees Dr. Lauro Regulus    BMI 45.0-49.9, adult     Fever 11/09/2022    Headache     migraine    Hx of ovarian cyst     Lower back pain     Meningitis 2011    Viral // treated and resolved    Pain in wrist 11/09/2022    Post-operative nausea and vomiting     Scoliosis     moderate// used a Brace  at a young age        Past Surgical History:   Procedure Laterality Date    ABLATION - PVC'S N/A 06/08/2022    Procedure: Ablation - PVC's;  Surgeon: Jaynie Bream, MD;  Location: [facility];  Service: Cardiovascular;  Laterality: N/A;  SAME DAY DISCHARGE  CARTO NO TEE    ARTHROTOMY, WRIST Right 12/03/2020    Procedure: RIGHT UPPER EXTREMITY SYNOVIAL BIOPSY;  Surgeon: Lenox Ahr, MD;  Location: [facility];  Service: Plastics;  Laterality: Right;    BIOPSY, LYMPH NODE N/A 07/23/2018    Procedure: BIOPSY, LYMPH NODE;  Surgeon: Neldon Labella, MD;  Location: [facility];  Service: ENT;  Laterality: N/A;  NECK DEEP LYMPH NODE BIOPSY    BONE MARROW BIOPSY  2020    led to behcet's dx    CARDIAC ABLATION  2005    [facility] [place] for Wolff-Parkinson-White    CHOLECYSTECTOMY  2009    [facility] DIAGNOSTIC  2010    DILATION AND CURETTAGE OF UTERUS  2010    FINGER GANGLION CYST EXCISION  04/2018    GANGLION CYST EXCISION  2014    04/2018 cyst removed from index finger-[place]  2016    HYSTERECTOMY  2012    HYSTEROSCOPY  2011    indication: Abnormal bleeding    LAPAROSCOPIC, LYSIS, ADHESIONS N/A 11/09/2022    Procedure: LAPAROSCOPIC LYSIS OF ADHESIONS;  Surgeon: Lindell Noe, MD;  Location: [facility];  Service: Gynecology;  Laterality: N/A;    LAPAROSCOPIC, SALPINGECTOMY Left 11/09/2022    Procedure: LAPAROSCOPIC, SALPINGECTOMY LEFT;  Surgeon: Lindell Noe, MD;  Location: [facility];  Service: Gynecology;  Laterality: Left;    LAPAROSCOPY, DIAGNOSTIC N/A 11/09/2022    Procedure: LAPAROSCOPY, DIAGNOSTIC;  Surgeon: Lindell Noe, MD;  Location: [facility];  Service: Gynecology;  Laterality: N/A;  0000000    UMBILICAL HERNIA REPAIR  2016    UPPER GASTROINTESTINAL ENDOSCOPY  2009    WISDOM TOOTH EXTRACTION  1998        FH/SH:  Family  History   Adopted: Yes       Social History     Tobacco Use    Smoking status: Never    Smokeless tobacco: Never   Vaping Use     Vaping Use: Never used   Substance Use Topics    Alcohol use: Not Currently     Alcohol/week: 0.0 standard drinks of alcohol     Comment: rarely    Drug use: Never        Meds/ Allergies:  No outpatient medications have been marked as taking for the 01/06/23 encounter (Telemedicine Visit) with Mliss Sax, MD.     Allergies   Allergen Reactions    Fruit Blend Flavor [Flavoring Agent] Anaphylaxis and Swelling     Tropical Fruit     Cyanoacrylate     Latex Hives, Swelling, Other (See Comments) and Drug-Induced Flushing    Adhesive [Wound Dressing Adhesive] Itching    Other Itching and Rash     Sutures Monocryl    Oxycodone Hallucinations          PHYSICAL EXAM  General- WNWD, alert and oriented, NAD  Eyes- EOMI, PERRL, no conjunctival injection, no scleral icterus  ENMT- face symmetric without lesions  Skin- no rash, no alopecia  Pulm: No conversational dyspnea, breathing comfortable on room air      .        LABS:  Lab Results   Component Value Date    WBC 7.30 11/25/2022    HGB 12.0 11/25/2022    HCT 36.6 11/25/2022    MCV 94.1 11/25/2022    PLT 334 11/25/2022      Lab Results   Component Value Date    CREAT 0.8 11/25/2022    BUN 10.0 11/25/2022    NA 139 11/25/2022    K 3.7 11/25/2022    CL 107 11/25/2022    CO2 23 11/25/2022      Lab Results   Component Value Date    ALT 44 11/25/2022    AST 31 11/25/2022    ALKPHOS 62 11/25/2022    BILITOTAL 0.4 11/25/2022     Lab Results   Component Value Date    ESR 18 08/27/2022    ESR 37 (H) 07/25/2022    ESR 15 01/14/2022    ESR 6 02/23/2021    ESR 7 01/25/2021      Lab Results   Component Value Date    CRP 0.1 08/27/2022    CRP 1 07/25/2022    CRP 1 01/14/2022    CRP <1 02/23/2021    CRP 2 01/25/2021      No results found for: "ANA"   Lab Results   Component Value Date    RHEUMFACTOR <15.0 02/06/2020      Lab Results   Component Value Date    CCP <15.6 02/06/2020     No results found for: "URICACID"        ASSESSMENT/PLAN:    This patient is a 42 y.o. year female with  Past Medical History of Prior Cholecystectomy, Wolf-Parkinson White, Prior Hysterectomy, Fatty Liver Disease who returns for follow up of Behcet's Disease and seronegative rheumatoid arthritis.  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    Anterior uveitis can be a manifestations of Behcets.  Unfortauntely this happened despite weekly Humira, suspect she is becoming tolerant or there may be issues with bioavailability of SQ medication.  We discussed switching to Remicade.  Discussed some side effects including infusion reaction, allergy.  Therapy Plan ordered.    She is up todate on immunizations-pneumonia, flu, covid    Will continue Otezla 30 mg BID for recurrent mouth sores.    She will eventually need baseline pulmonary evaluation and PFTs-prolonged recovery from pneumonia, rule out obstructive disease.    She will also follow up with ophthalmology for eye exams to monitor uveitis and response to change in DMARD.    Would avoid adding methtorexate or azathioprine given previous issues with LFT abnormalities and also baseline GI symptoms pending GI evaluation-high risk she would have GI intolerance from DMARDs.    Following with Neurology for migraines.    She will see GI for diarrhea, abdominal pain, nausea.      1. Anterior uveitis  - Quantiferon(R) - TB Gold Plus; Future  - Hepatitis C (HCV) antibody, Total; Future  - Hepatitis B (HBV) Surface Antigen w/ Reflex to Confirmation; Future  - Hepatitis B (HBV) Surface Antibody Quant; Future  - Hepatitis B core antibody, total; Future    2. Seronegative rheumatoid arthritis  - Quantiferon(R) - TB Gold Plus; Future  - Hepatitis C (HCV) antibody, Total; Future  - Hepatitis B (HBV) Surface Antigen w/ Reflex to Confirmation; Future  - Hepatitis B (HBV) Surface Antibody Quant; Future  - Hepatitis B core antibody, total; Future    3. Behcet's disease  - Quantiferon(R) - TB Gold Plus; Future  - Hepatitis C (HCV) antibody, Total; Future  - Hepatitis B (HBV) Surface  Antigen w/ Reflex to Confirmation; Future  - Hepatitis B (HBV) Surface Antibody Quant; Future  - Hepatitis B core antibody, total; Future    4. High risk medication use  - Quantiferon(R) - TB Gold Plus; Future  - Hepatitis C (HCV) antibody, Total; Future  - Hepatitis B (HBV) Surface Antigen w/ Reflex to Confirmation; Future  - Hepatitis B (HBV) Surface Antibody Quant; Future  - Hepatitis B core antibody, total; Future    5. Fatty liver disease, nonalcoholic  - Quantiferon(R) - TB Gold Plus; Future  - Hepatitis C (HCV) antibody, Total; Future  - Hepatitis B (HBV) Surface Antigen w/ Reflex to Confirmation; Future  - Hepatitis B (HBV) Surface Antibody Quant; Future  - Hepatitis B core antibody, total; Future                         Total Time spent reviewing records, interviewing patient and coordinating plan of care: 47 min    FOLLOW UP:  Return for already has visit-6 weeks.                                                                                  Tarry Kos, MD  Rheumatologist  [facility]  [address]  [place], [place] [address]  Phone: [phone]  Fax: [phone]

## 2023-01-06 NOTE — Telephone Encounter (Signed)
Called office of Dr. Rejeana Brock, [facility], ph [phone] to request Progress Notes. Awaiting fax to scan for physician review

## 2023-01-09 ENCOUNTER — Encounter (INDEPENDENT_AMBULATORY_CARE_PROVIDER_SITE_OTHER): Payer: Self-pay | Admitting: Internal Medicine

## 2023-01-09 ENCOUNTER — Ambulatory Visit (INDEPENDENT_AMBULATORY_CARE_PROVIDER_SITE_OTHER): Payer: BLUE CROSS/BLUE SHIELD | Admitting: Residents

## 2023-01-09 ENCOUNTER — Encounter (INDEPENDENT_AMBULATORY_CARE_PROVIDER_SITE_OTHER): Payer: Self-pay | Admitting: Residents

## 2023-01-09 DIAGNOSIS — R110 Nausea: Secondary | ICD-10-CM

## 2023-01-09 DIAGNOSIS — R197 Diarrhea, unspecified: Secondary | ICD-10-CM

## 2023-01-09 DIAGNOSIS — R1084 Generalized abdominal pain: Secondary | ICD-10-CM

## 2023-01-09 MED ORDER — DICYCLOMINE HCL 10 MG PO CAPS
10.0000 mg | ORAL_CAPSULE | Freq: Four times a day (QID) | ORAL | 0 refills | Status: DC
Start: 2023-01-09 — End: 2023-01-23

## 2023-01-09 MED ORDER — PEG 3350-KCL-NABCB-NACL-NASULF 236 G PO SOLR
1.0000 | Freq: Once | ORAL | 0 refills | Status: AC
Start: 2023-01-09 — End: 2023-01-09

## 2023-01-09 NOTE — Patient Instructions (Addendum)
To schedule or reschedule a procedure, please call my procedure coordinator Ernie Hew at [phone].     - EGD  - Colonoscopy  - Dicyclomine 10 mg four times daily when having GI symptoms   --------------------  UPPER ENDOSCOPY INSTRUCTIONS AND INFO:    At least 1 week before GI procedure:  ** Please consult with your primary physician on specific instructions for diabetes and blood thinning  (anticoagulation) medications if you are taking these medications.  - Avoid taking iron pills for at least 5-7 days before GI procedure; NO NSAIDs medication for 3-4 days before GI procedure such as Motrin, Aleve, Naproxen, Ibuprofen, etc. but Acetaminophen (Tylenol) is OK.              NOTE 1: You MUST notify at least 4-5 business days before your procedure date in case you need to cancel/reschedule your GI procedure.     NOTE 2: If you come late (MUST come at least 1 hr before the procedure time) for paperwork and Anesthesia evaluation, then your procedure may be canceled/rescheduled or delayed until other cases are done.      NOTE 3: You MUST have a family/friend to drive or bring you home from the GI procedures.  Otherwise, the GI procedure will be canceled    1 Day Before GI Procedure:  After eating dinner, you may drink beverages till midnight.    Please STOP eating or drinking after midnight (or earlier if instructed)      Day of GI procedure:  - NO eating or drinking or chewing gum    - Take all daily medications as instructed especially blood pressure medications EXCEPT: medications for diabetes and blood thinning (anticoagulation), which you will need to get specific instructions from your primary physician;   - You may take baby aspirin if it is for your heart or other medical conditions as instructed.  - Avoid NSAIDs and iron pills as instructed.       *IMPORTANT NOTE ON RISK: The risks of upper endoscopy include bleeding, infection, perforation which may require surgery, sedation risks including cardiac  arrest, death, as well as problems with teeth/mouth.      PLEASE NOTE:  YOU MUST HAVE SOMEONE DRIVE YOU HOME FROM YOUR GI PROCEDURE, OTHERWISE, YOUR PROCEDURE WILL BE CANCELLED!        If any question:  [facility]  [address] [place], [place]  [id number]  [phone]  ---------------  Split Dose Colonoscopy Preparation Instructions - COLYTE, GAVILYTE, GOLYTELY, OR NULYTELY  Please carefully read a week before your procedure.    IF YOU ARE ON BLOOD THINNERS (COUMADIN, PLAVIX, etc.), INSULIN OR OTHER DIABETIC MEDICATIONS, PLEASE LET us KNOW AND CHECK WITH YOUR PRESCRIBING PHYSICIAN FOR INSTRUCTIONS. Your prescribing provider needs to determine if you should stop or stay on your blood thinner before procedure.  Not following these instructions may result in cancellation.      General Endoscopy Information  Do no chew gum or suck on hard candy the day of your procedure.  You must have a responsible adult to accompany you home after the procedure. This person must pick you up in the endoscopy unit. If you do not have a responsible adult to accompany you home, your procedure will be cancelled.   You may not operate a motor vehicle for the remainder of the day following your procedure.   You may not take a taxi, [place] or bus home unless accompanied by a responsible adult.  If your insurance company requires a REFERRAL, YOU MUST BRING IT WITH YOU. Also, please bring your current insurance card(s), copay (if applicable), and a current picture ID with you on the day of your procedure.   Our office will contact your insurance carrier to verify coverage and, if required, obtain preauthorization for your procedure. However, preauthorization is not a guarantee of payment and you will be responsible for any deductibles, copays, co-insurance, and/or any other plan specific out-of-pocket expenses.   Dependent upon your family history, personal history, prior gastroenterology  diagnoses, or findings discovered during your colonoscopy, your procedure may be considered preventative or diagnostic. This determination will not be made until after the procedure has concluded and will be based upon the findings of your exam. In our experience, many insurance carriers cover preventative and diagnostic colonoscopies differently, and as a result, your out-of-pocket payment may also differ. If you have any questions about your coverage, please contact your insurance carrier directly.   If you have any questions, please contact your GI physician's office during normal business hours.    Preparation Instructions    One (1) day before the procedure date: Split Dose: 1st half    Drink only clear liquids the entire day. No solid food should be taken. Clear liquids include: water, broth without any solid pieces, apple juice, white grape juice, pulp free lemonade, sprite, ginger-ale, coffee or tea without milk or nondairy creamers, plain Jell-O (no added fruit or toppings, no red, purple, or blue Jell-O). See clear diet below.    Prepare the solution: Gavilyte, Golytely, Nulytely or Colyte Prep: mix the powder with water in the provided plastic container to the 'fill" line and chill in the refrigerator.   You may add "Crystal Light" powdered lemonade (as an alternative to the flavor packets) to the solution to improve its taste.  No solid food should be taken during or after the prep.   At 5 p.m.: Begin 1st dose of the prep solution at a rate of 8 ounces every 15-30 minutes (over 1-2 hours) until half of the prep solution is finished. If you feel full or nauseated by drinking the solution, then slow down and finish the first half of the solution before midnight.  Please continue to drink clear liquids until you go to bed.     * It is not uncommon for individuals to experience bloating or nausea when drinking the solution. If vomiting or other symptoms concern you, please call your GI physician's  office.    The day of your procedure: Split dose: 2nd half    6 hours before your procedure time: Start 2nd dose of prep solution. Depending on your procedure start time this may require waking up early to complete the prep.  Drink the solution at a rate of 8 ounces every 15-30 minutes (over 1-2 hours) until you finish the entire solution.  It is important that you finish the remaining 2 liters of the prep solution at least 4 hours before your scheduled procedure.  Complete 2nd dose 4 hours prior to your scheduled start time. Do not take anything by mouth starting 4 hours before your procedure.  Do not eat hard candy or chew gum.   Wear comfortable clothing that is easy to remove and leave jewelry at home. Leave any valuables (i.e., purse, cell phone etc.) with family or companion.  Report to the Endoscopy Procedure Unit 90 minutes before your scheduled procedure time.  Once in the pre-procedural area, you will be  asked to put on a hospital gown. A nurse will review your medical history with you (Bring a list of your current medication, allergies, and a copy of your recent EKG). An intravenous line (IV) will be started for your sedation during the procedure.  When your procedure is done, you may remain in the recovery room for up to 1 hour.  Your doctor will discuss the results of your procedure with you and give you a copy of your report.     CLEAR LIQUID DIET   THESE ITEMS ARE ALLOWED:  Water  Clear broth: beef, chicken, vegetable  Juices  Apple juice or cider  White grape juice, white cranberry juice  Tang  Lemonade  Soda (clear color)  Tea  Coffee (without cream)  Clear gelatin (without fruit)  Popsicles (without fruit or cream)  [place] ices THESE ITEMS ARE NOT ALLOWED:  Milk  Cream  Milkshakes  Tomato juice  Orange juice  Cream soups  Any soup other than the listed broth  Oatmeal  Cream of Wheat  Grapefruit juice  Alcohol     PLEASE NOTE: It is extremely important to follow the preparation listed above, so  that the doctor will be able to see your entire colon. Your colon must be clear of any stool. Inadequate preparation limits the value of this procedure and could necessitate rescheduling of the examination.     * If you need to reschedule or cancel your appointment, please call you GI physician's office.      FREQUENTLY ASKED QUESTIONS:    My procedure is in the afternoon. Can I eat in the morning?   A: No. To ensure your safety during the procedure, it is important that the stomach is empty. Any food or liquid in the stomach at the time of the procedure places you at risk of aspirating these contents into the lung leading to a serious complication called aspiration pneumonia. You can drink water until 4 hours before your procedure time.     I ate breakfast (lunch or dinner) the day before my colonoscopy. Is that okay?    A: If the preparation instructions were not followed properly, residual stool may remain in the colon and hide important findings from the examining physician. In some cases, if the colon preparation is not good, you may have to repeat the preparation and the exam. If you accidentally eat any solid food the day before your exam, please call and ask to speak with a member of the nursing staff. You may be asked to reschedule your procedure.     I don't have a ride. Is that okay?   A: No. If you do not have a responsible adult to accompany you home, YOUR PROCEDURE WILL BE CANCELLED.    How many days prior to the procedure should I discontinue my Coumadin, Plavix or other blood thinning medications?   A: If you are taking any blood thinning medications please let your endoscopist know. Generally speaking, you should quit taking your blood thinning medication 2-7 days prior to some procedures depending on the medication; however, you must check with your endoscopist and your prescribing physician to ensure that it is needed and safe for you to do so.    What medications can I take the day before and the  day of my procedure?  A: The day prior to your procedure take your medications the way you normally would. However, for those patients taking any type of bowel cleansing preparation, be advised  that you may undergo a prolonged period of diarrhea that may flush oral medications out of your system before they have time to take effect. The morning of your procedure you should take any blood pressure or heart medications you may be on with a small sip of water. You can hold most other medications and take them once your procedure has been completed. If you have questions about specific medication(s), please call a member of the endoscopy unit clinical staff.    I am diabetic. Do I take my insulin?   A: You must direct the question to the physician who placed you on this medication. Please check your blood sugar the morning of your procedure as you normally would. If you have any questions about your diabetes management in conjunction with your fast for your endoscopic procedure, please consult your primary physician.     I am on pain medication? Can I take it prior to my procedure?   A: You may take your prescription pain medications prior to your procedure with a small sip of water. Please inform the pre-procedural clinical staff of any medications you've taken the day of your procedure. If you have any questions, please call a member of the endoscopy unit clinical staff.    I'm having a menstrual period. Should I reschedule my colonoscopy appointment?   A: No. Your menstrual period will not interfere with your physician's ability to complete your procedure.     May I continue taking my iron tablets?   A: No. Iron may cause the formation of dark color stools which can make it difficult for the physician to complete your colonoscopy if your preparation is less than optimal. We recommend you stop taking your oral iron supplements at least one week prior to your procedure.     I have been on Aspirin therapy for my heart.  Should I continue to take it?   A: Aspirin may affect blood coagulation. Please check with your endoscopist and prescribing physician.     I am having a colonoscopy tomorrow. I started my colon preparation on time but now I am experiencing diarrhea and/or a bloating feeling. What should I do?   A: Nausea, vomiting and a sense of fullness or bloating can occur any time after beginning your colon preparation. However, it is important that you drink all the preparation. For most people, taking an hour break from the preparation will usually help. Then continue taking the preparation as ordered. If the vomiting returns or symptoms get worse, please call your GI physician's office.    If you have any questions or concerns, please don't hesitate to call.     Sincerely,    [facility] Team

## 2023-01-09 NOTE — Addendum Note (Signed)
Addended byClayborne Artist on: 01/09/2023 11:39 AM     Modules accepted: Orders

## 2023-01-09 NOTE — Progress Notes (Signed)
[facility] Visit     Date of Visit: 01/09/2023 11:03 AM  Referring Physician: Mliss Sax, MD     CC: Diarrhea   HPI: Hailey [name] is a 42 y.o. female with h/o Behcet's who was referred to GI Clinic for diarrhea.    Current GI symptoms:  - On Humira for Behcet's disease. Switching to Remicade.   - Diagnosed 2 years ago w/ Behcet's disease, sx started 5 years ago.     - Diarrhea- 3-6 day periods of loose stools 5/day. Every 6 weeks. Has woken up in the middle of the night to have a BM. Consistency is loose/watery. Not dependent on food- tried cutting out gluten, dairy, FODMAPs. Has noted blood in the past w/ mucus. Some rectal pain/cramp with repeated stools. Started four years ago. Not sure if related to when becoming due for another Humira dose.   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 Lauro Regulus    BMI 45.0-49.9, adult     Fever 11/09/2022    Headache     migraine    Hx of ovarian cyst     Lower back pain     Meningitis 2011    Viral // treated and resolved    Pain in wrist 11/09/2022    Post-operative nausea and vomiting     Scoliosis     moderate// used a Brace at a young age       [place]:   Past Surgical History:   Procedure Laterality Date    ABLATION - PVC'S N/A 06/08/2022    Procedure: Ablation - PVC's;  Surgeon: Jaynie Bream, MD;  Location: [facility];  Service: Cardiovascular;  Laterality: N/A;  SAME DAY DISCHARGE  CARTO NO TEE    ARTHROTOMY, WRIST Right 12/03/2020    Procedure: RIGHT UPPER EXTREMITY SYNOVIAL BIOPSY;  Surgeon: Lenox Ahr, MD;  Location: [facility];  Service: Plastics;  Laterality: Right;    BIOPSY, LYMPH NODE N/A 07/23/2018    Procedure: BIOPSY, LYMPH NODE;  Surgeon: Neldon Labella, MD;  Location: [facility];  Service: ENT;  Laterality: N/A;  NECK DEEP LYMPH NODE BIOPSY    BONE MARROW BIOPSY  2020    led to behcet's dx    CARDIAC ABLATION  2005    [place] [place] [facility] [place] for Wolff-[facility]    CHOLECYSTECTOMY  2009    D&C DIAGNOSTIC  2010    DILATION AND CURETTAGE OF UTERUS  2010    FINGER GANGLION CYST EXCISION  04/2018    GANGLION CYST EXCISION  2014    04/2018 cyst removed from index finger-[place] [facility]  2016    HYSTERECTOMY  2012    HYSTEROSCOPY  2011    indication: Abnormal bleeding    LAPAROSCOPIC, LYSIS, ADHESIONS N/A 11/09/2022  Procedure: LAPAROSCOPIC LYSIS OF ADHESIONS;  Surgeon: Lindell Noe, MD;  Location: [facility];  Service: Gynecology;  Laterality: N/A;    LAPAROSCOPIC, SALPINGECTOMY Left 11/09/2022    Procedure: LAPAROSCOPIC, SALPINGECTOMY LEFT;  Surgeon: Lindell Noe, MD;  Location: [facility];  Service: Gynecology;  Laterality: Left;    LAPAROSCOPY, DIAGNOSTIC N/A 11/09/2022    Procedure: LAPAROSCOPY, DIAGNOSTIC;  Surgeon: Lindell Noe, MD;  Location: [facility];  Service: Gynecology;  Laterality: N/A;  0000000    UMBILICAL HERNIA REPAIR  2016    UPPER GASTROINTESTINAL ENDOSCOPY  2009    WISDOM TOOTH EXTRACTION  1998       Current Medications:  Outpatient Medications Marked as Taking for the 01/09/23 encounter (Office Visit) with Clayborne Artist, MD   Medication Sig Dispense Refill    Adalimumab (Humira) 40 MG/0.4ML Prefilled Syringe Kit Inject 0.4 mLs (40 mg) into the skin every 10 (ten) days 6 each 3    Adalimumab (Humira, 2 Pen,) 40 MG/0.4ML Pen-injector Kit Inject 0.4 mLs (40 mg) into the skin once a week Pen-injectors 12 each 1    Apremilast (Otezla) 30 MG Tab Take 1 tablet (30 mg) by mouth 2 (two) times daily 180 tablet 1     Azelastine-Fluticasone 137-50 MCG/ACT Suspension 1 spray by Nasal route 2 (two) times daily      botulinum Toxin Type A (Botox) 200 units Recon Soln injection Inject 155 units IM every 12 weeks 1 each 3    busPIRone (BUSPAR) 5 MG tablet TAKE 1 TABLET (5 MG) BY MOUTH 3 (THREE) TIMES DAILY AS NEEDED (ANXIETY) 270 tablet 0    clindamycin (CLEOCIN T) 1 % lotion Apply topically every morning      colesevelam (WELCHOL) 625 MG tablet Take 2 tablets (1,250 mg) by mouth every morning      Dapsone 7.5 % Gel Apply topically every morning      hydrOXYzine (ATARAX) 10 MG tablet Take 1 tablet (10 mg) by mouth nightly 90 tablet 3    ibuprofen (ADVIL) 200 MG tablet Take 3 tablets (600 mg) by mouth every 6 (six) hours as needed for Pain 30 tablet 1    Jublia 10 % Solution       lidocaine-prilocaine (EMLA) cream Apply topically as needed Dermatology procedures      ondansetron (ZOFRAN-ODT) 4 MG disintegrating tablet TAKE 1 TABLET (4 MG) BY MOUTH EVERY 8 (EIGHT) HOURS AS NEEDED FOR NAUSEA 30 tablet 2    Rimegepant Sulfate 75 MG Tablet Dispersible Take 1 tablet (75 mg) by mouth every other day For migraine prevention 16 tablet 11    rizatriptan (MAXALT) 10 MG tablet Take 1-2 tablets (10-20 mg total) by mouth once as needed for Migraine Max 3 days a week 36 tablet 3    valACYclovir (VALTREX) 1000 MG tablet Take 2 tabs at onset; repeat once in 12 hours.  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  Eyes: Denies vision or eye problems.   ENMT: Denies hearing or ear problem, nasal discharge, oral lesions or sore throat.  Respiratory: Denies wheezing or cough.  Cardiovascular: Denies chest pain or palpitations.  Gastrointestinal: See HPI.  Genitourinary: Denies urinary symptoms or problems.  Musculoskeletal: Denies back or joint pain or problem.  Neurologic: Denies slurred speech, hemiplegia or focal deficit.  Psychiatric: Denies depression or anxiety.   Integumentary:  Denies skin rashes or jaundice.    Physical Exam:  BP (!) 155/99 (BP Site: Left arm, Patient Position: Sitting, Cuff Size: Medium)   Pulse (!) 128   Ht 1.727 m ('5\' 8"'$ )   Wt 142.9 kg (315 lb)   LMP  (LMP Unknown)   BMI 47.90 kg/m        11/23/2022     2:21 PM 11/23/2022     9:58 PM 01/09/2023    10:33 AM   Weight Monitoring   Height 172.7 cm 172.7 cm 172.7 cm   Height Method Stated Stated    Weight 142.883 kg 143.7 kg 142.883 kg   Weight Method Stated Bed Scale    BMI (calculated) 48 kg/m2 48.3 kg/m2 47.9 kg/m2       General: Well developed and well nourished, no acute distress.  Eyes: Sclera anicteric, conjunctiva normal and no ptosis.  HENT:  Nose and ears appear normal.  Oropharynx clear.  Chest/Lungs: No respiratory distress or abnormal audible sound.   Heart: Normal rate, regular rhythm.   Abdomen: Soft, non-distended with bowel sounds. No tenderness to palpation. No mass/organomegaly.  Musculoskeletal: No gross deformity, tenderness, swelling.  Extremities:  No edema, cyanosis.  Skin: Normal color and turgor, no rashes, no suspicious skin lesions noted.  Neurologic: Alert and oriented.  No obvious focal deficits.  Psychiatric: Cooperative, appropriate mood, normal affect.    Labs Reviewed:  Recent Labs     11/25/22  0341 11/23/22  1450 10/11/22  1430   WBC 7.30 10.78* 12.10*   Hgb 12.0 14.4 14.1   Hematocrit 36.6 41.9 42.2   Platelets 334 402* 386*       Recent Labs     04/22/18  1436   PT 12.1*   PT INR 0.9        Recent Labs     11/25/22  0341 11/23/22  1450 10/11/22  1430 05/27/22  1001 01/14/22  0000 02/23/21  0743 05/18/20  0824 02/06/20  1244 07/26/19  1442 04/22/18  1436 09/15/17  0000   Sodium 139 136 138   < > 141   < > 139 140 139   < > 139   Potassium 3.7 4.1 4.4   < > 5.1   < > 4.7 4.2 3.7   < > 4.9   Chloride 107 106 107   < > 105   < > 101 104 104   < > 100   CO2 '23 21 22   '$ < > 18*   < > 17* 24 23   < > 25   BUN 10.0 9.0 11.0   < > 13   < > 11 8.0 15.2   < > 16   Creatinine 0.8 0.7 0.8   < > 0.82   < > 0.74 0.8 0.8   < > 0.73   Calcium 8.9 9.8  10.2   < > 9.8   < > 9.8 9.3 9.6   < > 9.6   Albumin 3.6 4.3 4.3   < > 4.8   < > 4.4 4.2 4.1   < > 4.9   Protein, Total 6.9 7.7 8.3   < > 7.4   < > 7.4 7.9 7.3   < > 7.7   Bilirubin, Total 0.4 0.6 0.5   < > 0.4   < > 0.4 0.5 0.4   < > 0.3   Alkaline Phosphatase 62 80 65   < > 62   < > 87 80 63   < > 76   ALT 44 51 52   < > 28   < > 70* 63* 50   < > 43*   AST (SGOT) 31 36 37   < > 22   < > 59* 46* 23   < > 24   Glucose 94 97 95   < > 99   < > 113* 97 88   < > 93   TSH  --   --   --   --  2.040  --   --  2.41  --   --  2.510   Ferritin  --   --   --   --   --   --  291*  --  176.64  --   --     < > = values in this interval not displayed.        No results found for: "BNP"    Lab Results   Component Value Date    FERRITIN 291 (H) 05/18/2020       Lab Results   Component Value Date    VITD 31.9 01/14/2022    VITD 31 02/06/2020       Lab Results   Component Value Date    TSH 2.040 01/14/2022    TSH 2.41 02/06/2020       Imaging/Procedures Reviewed:  MRI Lumbar Spine WO Contrast    Result Date: 12/19/2022  1.  No acute osseous or soft tissue abnormality. 2.  Normal MR appearance of the distal spinal cord, conus medullaris and cauda equina. 3.  Rotatory lumbar levoscoliosis. 4.  Lumbar spondylosis and facet arthrosis without central stenosis or nerve root compression. Please see full details and segmental analysis above. Electronically signed by: Peggye Ley M.D.  [facility] CM: 12/19/22    Results for orders placed during the hospital encounter of 10/11/22    CT Abdomen Pelvis W IV/ WO PO Cont    Narrative  HISTORY: Left lower quadrant pain. Concern for diverticulitis. Known  vaginal cyst.    COMPARISON: 09/01/2018.    TECHNIQUE: CT of the abdomen and pelvis performed with intravenous  contrast. The following dose reduction techniques were utilized: automated  exposure control and/or adjustment of the mA and/or KV according to patient  size, and the use of an iterative reconstruction technique.    CONTRAST: iohexol (OMNIPAQUE) 350 MG/ML injection 100 mL    FINDINGS:    Abdomen:  The liver demonstrates mild steatosis. There are no focal liver lesions.  Gallbladder is surgically absent with normal caliber bile ducts. Spleen,  pancreas, adrenal glands, and kidneys are unremarkable. There are no renal  calculi and there is no hydronephrosis.    There is no abdominal or retroperitoneal lymphadenopathy. There is no  ascites. Abdominal aorta and IVC are normal caliber and patent. There are  no abdominal wall hernias.    The stomach is non-distended. There is no appreciable small bowel  thickening or obstruction. Appendix is normal. Colon is non-distended.  There is no colitis, diverticulitis, abscess, or free air.    Pelvis:  The uterus is surgically absent. Cyst near the upper vagina now measures  3.8 cm. Bladder is decompressed. Deep pelvic veins are patent. There are no  inguinal hernias.    Skeletal:  There is mild degenerative arthritis and minimal levoscoliosis in the  lumbar spine. Minimal osteoarthritis is seen at the left SI joint. There  are no compression fractures.    Lower chest:  Lung bases are clear. There are no pleural effusions. Heart is normal size.    Impression  1. No detectable acute abnormalities.  2. No bowel obstruction or detectable bowel inflammation; no  diverticulitis.  3. No renal calculi or hydronephrosis.  4. Remainder as  above.    Sheria Lang, MD  10/11/2022 5:24 PM     Results for orders placed during the hospital encounter of 07/24/20    MRI Abdomen W WO Contrast    Narrative  [place] AT:  [id number]       DOB:December 12, 1980                 [address] [place]  07/24/2020     AGE:26   Gender:F              Physicians Only: [id number]        Evelina Bucy MD                                  [F]  [place], [place] [address]        MRI ABDOMEN MRCP WITHOUT AND WITH CONTRAST    HISTORY: Fever of unknown origin. Elevated liver enzymes. Evaluate for  biliary disease or liver mass.    COMPARISON: PET/CT dated 12/03/2019    TECHNIQUE: Multisequence MR imaging of the abdomen utilizing T1 and T2  weighted imaging including postcontrast LAVA and MRCP sequences. 3-D/MIP  images were reviewed.    Contrast: 10 cc GADAVIST administered.    FINDINGS:    Diffuse hepatic steatosis with a small area of fat sparing in inferior  segment 4. No focal suspicious hepatic lesions. Patent portal veins. No  biliary duct dilatation or choledocholithiasis. Status post  cholecystectomy.    Unremarkable spleen, pancreas, adrenals, and kidneys. No hydronephrosis.  Normal aortic caliber. No enlarged lymph nodes in the abdomen. Nondistended  bowel. No acute infectious/inflammatory process in the abdomen.    Impression  1. Diffuse hepatic steatosis. No suspicious focal hepatic lesions.  2. No biliary ductal dilatation or choledocholithiasis.    Electronically signed by: Marchelle Folks M.D.  [Interpreted at: 02WDB]  [facility]    EDH: 07/25/20       Assessment and Plan:  - Behcet's disease on Humira, now switching to Remicade  - Diarrhea w/ rectal bleeding occurring episodically, no hx colonoscopy. Adopted- no known fam hx.  - Occasional bilious emesis     - EGD for further evaluation  - Colonoscopy w/  random colon  bx to r/o microscopic colitis    The risks and benefits of my recommendations, as well as other treatment options were discussed with the patient today. Time given to ask questions and all questions were answered. Patient strongly encouraged to set up follow up and reach out regarding any questions.     Follow up: RTC EGD/colonoscopy    Letter sent to referring provider: Yes    Clayborne Artist, M.D.  [facility]  Department of [place]

## 2023-01-10 ENCOUNTER — Other Ambulatory Visit (INDEPENDENT_AMBULATORY_CARE_PROVIDER_SITE_OTHER): Payer: Self-pay | Admitting: Transplant Hepatology

## 2023-01-10 ENCOUNTER — Other Ambulatory Visit (FREE_STANDING_LABORATORY_FACILITY): Payer: BLUE CROSS/BLUE SHIELD

## 2023-01-10 ENCOUNTER — Encounter (INDEPENDENT_AMBULATORY_CARE_PROVIDER_SITE_OTHER): Payer: Self-pay | Admitting: Internal Medicine

## 2023-01-10 DIAGNOSIS — M352 Behcet's disease: Secondary | ICD-10-CM

## 2023-01-10 DIAGNOSIS — K760 Fatty (change of) liver, not elsewhere classified: Secondary | ICD-10-CM

## 2023-01-10 DIAGNOSIS — H209 Unspecified iridocyclitis: Secondary | ICD-10-CM

## 2023-01-10 DIAGNOSIS — M0600 Rheumatoid arthritis without rheumatoid factor, unspecified site: Secondary | ICD-10-CM

## 2023-01-10 DIAGNOSIS — R197 Diarrhea, unspecified: Secondary | ICD-10-CM | POA: Insufficient documentation

## 2023-01-10 DIAGNOSIS — R110 Nausea: Secondary | ICD-10-CM | POA: Insufficient documentation

## 2023-01-10 DIAGNOSIS — Z79899 Other long term (current) drug therapy: Secondary | ICD-10-CM

## 2023-01-10 DIAGNOSIS — R1084 Generalized abdominal pain: Secondary | ICD-10-CM | POA: Insufficient documentation

## 2023-01-10 LAB — COMPREHENSIVE METABOLIC PANEL
ALT: 46 U/L (ref 0–55)
AST (SGOT): 30 U/L (ref 5–41)
Albumin/Globulin Ratio: 1.1 (ref 0.9–2.2)
Albumin: 4.1 g/dL (ref 3.5–5.0)
Alkaline Phosphatase: 61 U/L (ref 37–117)
Anion Gap: 8 (ref 5.0–15.0)
BUN: 8 mg/dL (ref 7.0–21.0)
Bilirubin, Total: 0.5 mg/dL (ref 0.2–1.2)
CO2: 21 mEq/L (ref 17–29)
Calcium: 9.5 mg/dL (ref 8.5–10.5)
Chloride: 109 mEq/L (ref 99–111)
Creatinine: 0.7 mg/dL (ref 0.4–1.0)
Globulin: 3.7 g/dL — ABNORMAL HIGH (ref 2.0–3.6)
Glucose: 94 mg/dL (ref 70–100)
Potassium: 4 mEq/L (ref 3.5–5.3)
Protein, Total: 7.8 g/dL (ref 6.0–8.3)
Sodium: 138 mEq/L (ref 135–145)
eGFR: >60 mL/min/{1.73_m2} (ref >=60)

## 2023-01-10 LAB — HEPATITIS B CORE ANTIBODY, TOTAL: Hepatitis B Core Total Antibody: NONREACTIVE

## 2023-01-10 LAB — CBC AND DIFFERENTIAL
Absolute NRBC: 0 10*3/uL (ref 0.00–0.00)
Basophils Absolute Automated: 0.04 10*3/uL (ref 0.00–0.08)
Basophils Automated: 0.4 %
Eosinophils Absolute Automated: 0.14 10*3/uL (ref 0.00–0.44)
Eosinophils Automated: 1.6 %
Hematocrit: 42.4 % (ref 34.7–43.7)
Hgb: 14 g/dL (ref 11.4–14.8)
Immature Granulocytes Absolute: 0.05 10*3/uL (ref 0.00–0.07)
Immature Granulocytes: 0.6 %
Instrument Absolute Neutrophil Count: 3.93 10*3/uL (ref 1.10–6.33)
Lymphocytes Absolute Automated: 4.18 10*3/uL — ABNORMAL HIGH (ref 0.42–3.22)
Lymphocytes Automated: 46.8 %
MCH: 30.7 pg (ref 25.1–33.5)
MCHC: 33 g/dL (ref 31.5–35.8)
MCV: 93 fL (ref 78.0–96.0)
MPV: 8.2 fL — ABNORMAL LOW (ref 8.9–12.5)
Monocytes Absolute Automated: 0.6 10*3/uL (ref 0.21–0.85)
Monocytes: 6.7 %
Neutrophils Absolute: 3.93 10*3/uL (ref 1.10–6.33)
Neutrophils: 43.9 %
Nucleated RBC: 0 /100 WBC (ref 0.0–0.0)
Platelets: 376 10*3/uL — ABNORMAL HIGH (ref 142–346)
RBC: 4.56 10*6/uL (ref 3.90–5.10)
RDW: 13 % (ref 11–15)
WBC: 8.94 10*3/uL (ref 3.10–9.50)

## 2023-01-10 LAB — C-REACTIVE PROTEIN: C-Reactive Protein: <0.1 mg/dL (ref 0.0–1.1)

## 2023-01-10 LAB — HEPATITIS C ANTIBODY, TOTAL: Hepatitis C, AB: NONREACTIVE

## 2023-01-10 LAB — SEDIMENTATION RATE: Sed Rate: 31 mm/Hr — ABNORMAL HIGH (ref 0–20)

## 2023-01-10 LAB — HEPATITIS B SURFACE (HBV) ANTIBODY, QUANTITATIVE: HEPATITIS B SURFACE ANTIBODY: 371.94 m[IU]/mL

## 2023-01-10 LAB — HEMOLYSIS INDEX: Hemolysis Index: 4 Index (ref 0–24)

## 2023-01-10 LAB — HEPATITIS B (HBV) SURFACE ANTIGEN WITH REFLEX TO CONFIRMATION: Hepatitis B Surface Antigen: NONREACTIVE

## 2023-01-10 NOTE — Telephone Encounter (Signed)
Still awaiting notes; re-attempted call to request- awaiting fax

## 2023-01-10 NOTE — Telephone Encounter (Signed)
Thank You.

## 2023-01-10 NOTE — Telephone Encounter (Signed)
Ophthalmology Notes from office of Dr. Mardene Celeste received and scanned to Chart

## 2023-01-12 LAB — QUANTIFERON(R) - TB GOLD PLUS
Mitogen-NIL: 9.36 IU/mL
NIL: 0.02 IU/mL
Quantiferon TB Gold Plus: NEGATIVE
TB1-NIL: <0 IU/mL
TB2-NIL: <0 IU/mL

## 2023-01-13 ENCOUNTER — Inpatient Hospital Stay: Payer: BLUE CROSS/BLUE SHIELD | Admitting: Rehabilitative and Restorative Service Providers"

## 2023-01-18 ENCOUNTER — Inpatient Hospital Stay: Payer: BLUE CROSS/BLUE SHIELD

## 2023-01-19 ENCOUNTER — Other Ambulatory Visit (INDEPENDENT_AMBULATORY_CARE_PROVIDER_SITE_OTHER): Payer: Self-pay | Admitting: Residents

## 2023-01-21 ENCOUNTER — Inpatient Hospital Stay: Payer: BLUE CROSS/BLUE SHIELD | Attending: Orthopaedic Surgery

## 2023-01-21 DIAGNOSIS — M5451 Vertebrogenic low back pain: Secondary | ICD-10-CM | POA: Insufficient documentation

## 2023-01-21 NOTE — PT/OT Therapy Note (Signed)
Name: Hailey [name] Age: 42 y.o.   Date of Service: 01/21/2023  Referring Physician: Barton Fanny, MD   Date of Injury: No data found 11/09/2022  PT Date Care Plan Established/Reviewed:01/02/2023  PT Date Treatment Started:01/02/2023  OT Date Care Plan Established/Reviewed: No data found  OT Date Treatment Started: No data found    (Historic) Date of Injury:No data was found  (Historic) Date Care Plan Established/Reviewed No data was found  No data was found  (Historic) Date Treatment Started No data was found No data was found    End of Certification Date: [id number]  Sessions in Plan of Care: 24  Surgery Date: No data was found  MD Follow-up: No data was found  Medbridge Code: [facility]    Visit Count: 3   Diagnosis:    Diagnosis ICD-10-CM Associated Order   1. 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  Mentions some days the stretches help and some days it doesn't.  Says that she does not have any spasms anymore.  Mentions being a lot better - is able to do the things that she wasn't able to.  Mentions feeling like she might be ready for discharge/transition to 1x per week.        Social Support/Occupation    Lives in: multiple level home    Lives with: spouse    Occupation: Forensic psychologist (desk)         Precautions: none at this time  Allergies: Fruit blend flavor [flavoring agent], Cyanoacrylate, Latex, Adhesive [wound dressing adhesive], Other, and Oxycodone    Objective                     Treatment     Therapeutic Exercises - Justified to address any of the following:  To develop strength, endurance, ROM and/or flexibility.     Time taken to receive subjective information     DKTC with swiss ball x 20    ADDED 3 way  trunk flexion using swiss ball 5" x 12    Discussed (in detail) the benefits of decreasing treatment session frequency or consider potential discharge pending response to time between sessions/conversations had with DPT.    Neuromuscular Re-Education - Justified to address any of the following:   Re-education of movement, balance, coordination, kinesthetic sense, posture and/or proprioception for sitting and/or standing activities.     ADDED bridges on swiss ball x 20 (with posterior pelvic tilt)    ADDED LTR with swiss ball x 20    ADDED side-lying clamshells with RTB at knee 2 x 10 (cues given to improve LE alignment    ADDED sit to stands with RTB at knees 2 x 10 (cues given to ensure eccentric control was maintained)       ---      Flowsheet Row ---   Total Time    Timed Minutes 41 minutes   Total Time 41 minutes          Assessment     Patient arrives to PT session with pain (although unrelated) -  for this reason, withheld manual interventions and focused on general mobility/stability.  Added LTR (on swiss ball) side-lying clamshells, as well as sit to stands (with RTB at knees) - despite this/these changes, no additional complaints were had (despite previous responses to increased activity levels).  As a result, discussed the benefits of establishing an updated HEP (pending tolerance) with the intent of decreasing treatment session frequency/considering discharge (if discussion had with Lidia Collum, DPT coincidences).  Patient demonstrated understanding/was agreeable to the conversations had but would continue to benefit from skilled interventions (to improve upon residual impairments that contribute to functional limitations).    Plan     Update HEP - obtain objective measures     Goals      Goal 1: STG: Pt will increase lumbar extension to 15 deg in order to reach behind her or lay supine without pain in the lumbar.    01/21/2023 AD (in progress): able to lay for 30 minutes or so   Sessions: 6      Goal  2: Patient will demonstrate independence in prescribed HEP with proper form, sets and reps for safe discharge to an independent program.    01/21/2023 AD (in progress): Able to be compliant when other pain exaggerators are not present       Sessions: 24      Goal 3: Pt will increase FOTO score from 51% to 66% to demonstrate return to function with PT intervention prior to discharge.     01/21/2023 AD (in progress): 63     Sessions: 24      Goal 4: Pt will demonstrate increased lumbar flexion to the floor in order to bend over enough to lift objects off the floor.     01/21/2023 AD (in progress): Subjectively improved - able to flex lumbar spine distal tibia    Sessions: 24          Goal 5: Pt will increase hip abduction strength >4+/5 in order to lift and carry groceries into the house.     01/21/2023 AD (in progress): no functional goal deficit - able to carry groceries at tolerated loads    Sessions: [address] Jeramyah Goodpasture, LPTA

## 2023-01-23 ENCOUNTER — Ambulatory Visit (INDEPENDENT_AMBULATORY_CARE_PROVIDER_SITE_OTHER): Payer: BLUE CROSS/BLUE SHIELD | Admitting: Family Medicine

## 2023-01-23 ENCOUNTER — Other Ambulatory Visit: Payer: BLUE CROSS/BLUE SHIELD

## 2023-01-23 ENCOUNTER — Encounter (INDEPENDENT_AMBULATORY_CARE_PROVIDER_SITE_OTHER): Payer: Self-pay | Admitting: Family Medicine

## 2023-01-23 ENCOUNTER — Ambulatory Visit: Payer: BLUE CROSS/BLUE SHIELD | Attending: Internal Medicine

## 2023-01-23 VITALS — BP 143/91 | HR 93 | Temp 98.3°F | Ht 66.54 in | Wt 316.2 lb

## 2023-01-23 VITALS — BP 127/78 | HR 95 | Temp 98.6°F | Resp 14 | Wt 319.1 lb

## 2023-01-23 DIAGNOSIS — Z889 Allergy status to unspecified drugs, medicaments and biological substances status: Secondary | ICD-10-CM

## 2023-01-23 DIAGNOSIS — F419 Anxiety disorder, unspecified: Secondary | ICD-10-CM

## 2023-01-23 DIAGNOSIS — Z0000 Encounter for general adult medical examination without abnormal findings: Secondary | ICD-10-CM

## 2023-01-23 DIAGNOSIS — Z13220 Encounter for screening for lipoid disorders: Secondary | ICD-10-CM

## 2023-01-23 DIAGNOSIS — H209 Unspecified iridocyclitis: Secondary | ICD-10-CM | POA: Insufficient documentation

## 2023-01-23 DIAGNOSIS — K120 Recurrent oral aphthae: Secondary | ICD-10-CM

## 2023-01-23 DIAGNOSIS — Z79899 Other long term (current) drug therapy: Secondary | ICD-10-CM

## 2023-01-23 DIAGNOSIS — M352 Behcet's disease: Secondary | ICD-10-CM | POA: Insufficient documentation

## 2023-01-23 DIAGNOSIS — R1084 Generalized abdominal pain: Secondary | ICD-10-CM

## 2023-01-23 DIAGNOSIS — R110 Nausea: Secondary | ICD-10-CM

## 2023-01-23 DIAGNOSIS — K760 Fatty (change of) liver, not elsewhere classified: Secondary | ICD-10-CM | POA: Insufficient documentation

## 2023-01-23 DIAGNOSIS — M0600 Rheumatoid arthritis without rheumatoid factor, unspecified site: Secondary | ICD-10-CM | POA: Insufficient documentation

## 2023-01-23 DIAGNOSIS — E559 Vitamin D deficiency, unspecified: Secondary | ICD-10-CM

## 2023-01-23 DIAGNOSIS — J3089 Other allergic rhinitis: Secondary | ICD-10-CM

## 2023-01-23 DIAGNOSIS — K811 Chronic cholecystitis: Secondary | ICD-10-CM

## 2023-01-23 DIAGNOSIS — R002 Palpitations: Secondary | ICD-10-CM

## 2023-01-23 LAB — CBC AND DIFFERENTIAL
Absolute NRBC: 0 10*3/uL (ref 0.00–0.00)
Basophils Absolute Automated: 0.03 10*3/uL (ref 0.00–0.08)
Basophils Automated: 0.4 %
Eosinophils Absolute Automated: 0.08 10*3/uL (ref 0.00–0.44)
Eosinophils Automated: 1 %
Hematocrit: 40.4 % (ref 34.7–43.7)
Hgb: 13.8 g/dL (ref 11.4–14.8)
Immature Granulocytes Absolute: 0.06 10*3/uL (ref 0.00–0.07)
Immature Granulocytes: 0.7 %
Instrument Absolute Neutrophil Count: 4.21 10*3/uL (ref 1.10–6.33)
Lymphocytes Absolute Automated: 3.43 10*3/uL — ABNORMAL HIGH (ref 0.42–3.22)
Lymphocytes Automated: 41.3 %
MCH: 30.9 pg (ref 25.1–33.5)
MCHC: 34.2 g/dL (ref 31.5–35.8)
MCV: 90.6 fL (ref 78.0–96.0)
MPV: 8.5 fL — ABNORMAL LOW (ref 8.9–12.5)
Monocytes Absolute Automated: 0.49 10*3/uL (ref 0.21–0.85)
Monocytes: 5.9 %
Neutrophils Absolute: 4.21 10*3/uL (ref 1.10–6.33)
Neutrophils: 50.7 %
Nucleated RBC: 0 /100 WBC (ref 0.0–0.0)
Platelets: 383 10*3/uL — ABNORMAL HIGH (ref 142–346)
RBC: 4.46 10*6/uL (ref 3.90–5.10)
RDW: 13 % (ref 11–15)
WBC: 8.3 10*3/uL (ref 3.10–9.50)

## 2023-01-23 LAB — COMPREHENSIVE METABOLIC PANEL
ALT: 44 U/L (ref 0–55)
AST (SGOT): 28 U/L (ref 5–41)
Albumin/Globulin Ratio: 1.1 (ref 0.9–2.2)
Albumin: 4.1 g/dL (ref 3.5–5.0)
Alkaline Phosphatase: 67 U/L (ref 37–117)
Anion Gap: 9 (ref 5.0–15.0)
BUN: 9 mg/dL (ref 7.0–21.0)
Bilirubin, Total: 0.5 mg/dL (ref 0.2–1.2)
CO2: 24 mEq/L (ref 17–29)
Calcium: 9.8 mg/dL (ref 8.5–10.5)
Chloride: 107 mEq/L (ref 99–111)
Creatinine: 0.8 mg/dL (ref 0.4–1.0)
Globulin: 3.7 g/dL — ABNORMAL HIGH (ref 2.0–3.6)
Glucose: 113 mg/dL — ABNORMAL HIGH (ref 70–100)
Potassium: 4.3 mEq/L (ref 3.5–5.3)
Protein, Total: 7.8 g/dL (ref 6.0–8.3)
Sodium: 140 mEq/L (ref 135–145)
eGFR: >60 mL/min/{1.73_m2} (ref >=60)

## 2023-01-23 LAB — LIPID PANEL
Cholesterol / HDL Ratio: 4.6 Index
Cholesterol: 226 mg/dL — ABNORMAL HIGH (ref 0–199)
HDL: 49 mg/dL (ref 40–9999)
LDL Calculated: 131 mg/dL — ABNORMAL HIGH (ref 0–99)
Triglycerides: 228 mg/dL — ABNORMAL HIGH (ref 34–149)
VLDL Calculated: 46 mg/dL — ABNORMAL HIGH (ref 10–40)

## 2023-01-23 LAB — HEMOLYSIS INDEX: Hemolysis Index: 8 Index (ref 0–24)

## 2023-01-23 LAB — SEDIMENTATION RATE: Sed Rate: 28 mm/Hr — ABNORMAL HIGH (ref 0–20)

## 2023-01-23 LAB — C-REACTIVE PROTEIN: C-Reactive Protein: 0.1 mg/dL (ref 0.0–1.1)

## 2023-01-23 LAB — VITAMIN D, 25 OH, TOTAL: Vitamin D, 25 OH, Total: 28 ng/mL — ABNORMAL LOW (ref 30–100)

## 2023-01-23 LAB — TSH: TSH: 1.36 u[IU]/mL (ref 0.35–4.94)

## 2023-01-23 MED ORDER — HYDROXYZINE HCL 10 MG PO TABS
10.0000 mg | ORAL_TABLET | Freq: Every evening | ORAL | 3 refills | Status: DC
Start: 2023-01-23 — End: 2023-11-18

## 2023-01-23 MED ORDER — COLESEVELAM HCL 625 MG PO TABS
1250.0000 mg | ORAL_TABLET | Freq: Every morning | ORAL | 3 refills | Status: DC
Start: 2023-01-23 — End: 2023-11-18

## 2023-01-23 MED ORDER — DIPHENHYDRAMINE HCL 50 MG/ML IJ SOLN
25.0000 mg | Freq: Once | INTRAMUSCULAR | Status: AC
Start: 2023-01-23 — End: 2023-01-23
  Administered 2023-01-23: 25 mg via INTRAVENOUS
  Filled 2023-01-23: qty 1

## 2023-01-23 MED ORDER — DICYCLOMINE HCL 10 MG PO CAPS
10.0000 mg | ORAL_CAPSULE | Freq: Four times a day (QID) | ORAL | 0 refills | Status: DC
Start: 2023-01-23 — End: 2023-02-27

## 2023-01-23 MED ORDER — METHYLPREDNISOLONE SODIUM SUCC 40 MG IJ SOLR (WRAP)
40.0000 mg | Freq: Once | INTRAMUSCULAR | Status: AC
Start: 2023-01-23 — End: 2023-01-23
  Administered 2023-01-23: 40 mg via INTRAVENOUS
  Filled 2023-01-23: qty 1

## 2023-01-23 MED ORDER — SODIUM CHLORIDE 0.9 % IV SOLN
250.0000 mL | INTRAVENOUS | Status: DC
Start: 2023-01-23 — End: 2023-01-23
  Administered 2023-01-23: 250 mL via INTRAVENOUS

## 2023-01-23 MED ORDER — BUSPIRONE HCL 5 MG PO TABS
5.0000 mg | ORAL_TABLET | Freq: Every day | ORAL | 3 refills | Status: DC
Start: 2023-01-23 — End: 2023-02-21

## 2023-01-23 MED ORDER — ACETAMINOPHEN 325 MG PO TABS
650.0000 mg | ORAL_TABLET | Freq: Once | ORAL | Status: AC
Start: 2023-01-23 — End: 2023-01-23
  Administered 2023-01-23: 650 mg via ORAL
  Filled 2023-01-23: qty 2

## 2023-01-23 MED ORDER — ONDANSETRON 4 MG PO TBDP
4.0000 mg | ORAL_TABLET | Freq: Three times a day (TID) | ORAL | 2 refills | Status: AC | PRN
Start: 2023-01-23 — End: ?

## 2023-01-23 MED ORDER — EPIPEN 2-PAK 0.3 MG/0.3ML IJ SOAJ
0.3000 mg | Freq: Once | INTRAMUSCULAR | 2 refills | Status: DC | PRN
Start: 2023-01-23 — End: 2024-10-02

## 2023-01-23 MED ORDER — SODIUM CHLORIDE 0.9 % IV SOLN
700.0000 mg | Freq: Once | INTRAVENOUS | Status: AC
Start: 2023-01-23 — End: 2023-01-23
  Administered 2023-01-23: 700 mg via INTRAVENOUS
  Filled 2023-01-23: qty 700

## 2023-01-23 NOTE — Progress Notes (Signed)
Time: [place] Cal[place] is a 42 y.o. female here for C1D1 Infliximab infusion.  Offer c/o's baseline fatigue, joint pain, blurry vision, and diarrhea from her Behcets disease. Was recently on humira and steroids now switched to infliximab.    Pre-med :  tylenol, benadryl, and solumedrol    Infliximab infused over 120 min without incident.    See MAR, VS, Doc Flowsheets for more details.  Pt tolerated the infusion well without problems.    Time: [id number] , pt discharged stable, NAD.    RTC 02/06/23    Melony Overly, RN  01/23/2023

## 2023-01-23 NOTE — Progress Notes (Signed)
Subjective:      Patient ID: Hailey [name] is a 42 y.o. female.    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 Considering federal disability retirement.   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 Failed Humira, starting Remicade infusion this afternoon.  Reported Diet: moderate compliance with well-balanced diet  Reported Exercise: recently not as regularly - PT for back but low energy for exercise while in flare  Dental: regular dental visits three times a year, concerns about worsening dry mouth  Vision: glasses, regular eye exams , and eye exam < 1 year ago  Hearing: normal hearing  Immunization Status: immunizations up to date  Reproductive Health: sexually active and History of abnormal pap:  No - Jan 3 OB/GYN surgery for adhesions and peritubal cyst with tube removal, regular annual GYN visit is April, previous hysterectomy in 2012  Prior Screening Tests:  colonoscopy scheduled for May 2024, last pap smear in 2023, and last mammogram in [place] Risks:  adopted, so family history unclear  Safety Elements Used: uses seat belts, smoke detectors in household, carbon monoxide detectors in household, sunscreen use, does not text and drive, and guns at home  Depression Screening: Little interest or pleasure in doing things: 0 (01/23/2023 12:31 PM)  Feeling down, depressed, or hopeless: 0 (01/23/2023 12:31 PM)  PHQ Total Score: 0 (01/23/2023 12:31 PM)    Patient reports feeling more anxious at baseline and does not feel that taking her anxiety medications as needed is enough to address this.    Since beginning high dose prednisone earlier this year,  patient has had increasing frequency of tachycardia and hypertension. Previous BP baseline (after her ablation for frequent PVCs in August 2023) was around 120/80, but now her BP is frequently 140s/90s and HR is frequently around 120 at rest. Patient reports occasional palpitations, about once a week.     Patient says her back pain has been improving with PT and new muscle relaxant prescription.     Problem List:  Patient Active Problem List   Diagnosis    Morbid obesity    Herpes labialis    Non-seasonal allergic rhinitis, unspecified trigger    Migraine with aura and without status migrainosus, not intractable    BMI 45.0-49.9, adult    Post-cholecystectomy syndrome    History of Wolff-Parkinson-White (WPW) syndrome    Scoliosis deformity of spine    Behcet's disease    Immunocompromised state due to drug therapy    S/P hysterectomy    Premature ventricular contractions (PVCs) (VPCs)    Palpitations    NICM (nonischemic cardiomyopathy)    PVC's (premature ventricular contractions)    Left hip pain    Abdominal pain, unspecified abdominal location    Elevated blood pressure reading in office without diagnosis of hypertension    Vertebrogenic low back pain    Seronegative rheumatoid arthritis    Anterior uveitis    Generalized abdominal pain    Diarrhea, unspecified type    Nausea       Current Medications:  Outpatient Medications Marked as Taking for the  01/23/23 encounter (Office Visit) with Loletha Grayer, MD   Medication Sig Dispense Refill    Apremilast (Otezla) 30 MG Tab Take 1 tablet (30 mg) by mouth 2 (two) times daily 180 tablet 1    Azelastine-Fluticasone 137-50 MCG/ACT Suspension 1 spray by Nasal route 2 (two) times daily      botulinum Toxin Type A (Botox) 200 units Recon Soln injection Inject 155 units IM every 12 weeks 1 each 3    clindamycin (CLEOCIN T) 1 % lotion Apply topically every morning      Dapsone 7.5 % Gel Apply topically every morning      dexAMETHasone (DECADRON) 0.1 % ophthalmic  solution INSTILL 1 DROP INTO BOTH EYES FOUR TIMES A DAY      inFLIXimab 100 MG injection Infuse into the vein      Jublia 10 % Solution       lidocaine-prilocaine (EMLA) cream Apply topically as needed Dermatology procedures      Rimegepant Sulfate 75 MG Tablet Dispersible Take 1 tablet (75 mg) by mouth every other day For migraine prevention 16 tablet 11    rizatriptan (MAXALT) 10 MG tablet Take 1-2 tablets (10-20 mg total) by mouth once as needed for Migraine Max 3 days a week 36 tablet 3    valACYclovir (VALTREX) 1000 MG tablet Take 2 tabs at onset; repeat once in 12 hours.  Total 4 tabs per outbreak 12 tablet 3    [DISCONTINUED] busPIRone (BUSPAR) 5 MG tablet TAKE 1 TABLET (5 MG) BY MOUTH 3 (THREE) TIMES DAILY AS NEEDED (ANXIETY) 270 tablet 0    [DISCONTINUED] colesevelam (WELCHOL) 625 MG tablet Take 2 tablets (1,250 mg) by mouth every morning      [DISCONTINUED] hydrOXYzine (ATARAX) 10 MG tablet Take 1 tablet (10 mg) by mouth nightly 90 tablet 3    [DISCONTINUED] ondansetron (ZOFRAN-ODT) 4 MG disintegrating tablet TAKE 1 TABLET (4 MG) BY MOUTH EVERY 8 (EIGHT) HOURS AS NEEDED FOR NAUSEA 30 tablet 2       Allergies:  Allergies   Allergen Reactions    Fruit Blend Flavor [Flavoring Agent] Anaphylaxis and Swelling     Tropical Fruit     Cyanoacrylate     Latex Hives, Swelling, Other (See Comments) and Drug-Induced Flushing    Adhesive [Wound Dressing Adhesive] Itching    Other Itching and Rash     Sutures Monocryl    Oxycodone Hallucinations       Past Medical History:  Past Medical History:   Diagnosis Date    Anxiety     Arrhythmia     WPW ablation in 2005, PVC ablation 8/23    Behcet's disease     She sees Dr. Lauro Regulus    BMI 45.0-49.9, adult     Fever 11/09/2022    Headache     migraine    Hx of ovarian cyst     Lower back pain     Meningitis 2011    Viral // treated and resolved    Pain in wrist 11/09/2022    Post-operative nausea and vomiting     Scoliosis     moderate// used a Brace at a young age    Uveitis         Past Surgical History:  Past Surgical History:   Procedure Laterality Date    ABLATION - PVC'S N/A 06/08/2022    Procedure: Ablation - PVC's;  Surgeon: Jaynie Bream, MD;  Location: [facility];  Service: Cardiovascular;  Laterality: N/A;  SAME DAY  DISCHARGE  CARTO NO TEE    ARTHROTOMY, WRIST Right 12/03/2020    Procedure: RIGHT UPPER EXTREMITY SYNOVIAL BIOPSY;  Surgeon: Lenox Ahr, MD;  Location: [facility];  Service: Plastics;  Laterality: Right;    BIOPSY, LYMPH NODE N/A 07/23/2018    Procedure: BIOPSY, LYMPH NODE;  Surgeon: Neldon Labella, MD;  Location: [facility];  Service: ENT;  Laterality: N/A;  NECK DEEP LYMPH NODE BIOPSY    BONE MARROW BIOPSY  2020    led to behcet's dx    CARDIAC ABLATION  2005    [facility] for Wolff-Parkinson-White    CHOLECYSTECTOMY  2009    [facility] DIAGNOSTIC  2010    DILATION AND CURETTAGE OF UTERUS  2010    FINGER GANGLION CYST EXCISION  04/2018    GANGLION CYST EXCISION  2014    04/2018 cyst removed from index finger-[facility]  2016    HYSTERECTOMY  2012    HYSTEROSCOPY  2011    indication: Abnormal bleeding    LAPAROSCOPIC, LYSIS, ADHESIONS N/A 11/09/2022    Procedure: LAPAROSCOPIC LYSIS OF ADHESIONS;  Surgeon: Lindell Noe, MD;  Location: [facility];  Service: Gynecology;  Laterality: N/A;    LAPAROSCOPIC, SALPINGECTOMY Left 11/09/2022    Procedure: LAPAROSCOPIC, SALPINGECTOMY LEFT;  Surgeon: Lindell Noe, MD;  Location: [facility];  Service: Gynecology;  Laterality: Left;    LAPAROSCOPY, DIAGNOSTIC N/A 11/09/2022    Procedure: LAPAROSCOPY, DIAGNOSTIC;  Surgeon: Lindell Noe, MD;  Location: [facility];  Service: Gynecology;  Laterality: N/A;  0000000    UMBILICAL HERNIA REPAIR  2016    UPPER GASTROINTESTINAL ENDOSCOPY  2009    WISDOM TOOTH EXTRACTION  1998       Family History:  Family History   Adopted: Yes       Social History:  Social History     Tobacco Use    Smoking status: Never     Smokeless tobacco: Never   Vaping Use    Vaping Use: Never used   Substance Use Topics    Alcohol use: Never    Drug use: Never          The following sections were reviewed this encounter by the provider:   Tobacco  Allergies  Meds  Problems  Med Hx  Surg Hx  Fam Hx           Review of Systems   Constitutional:  Positive for fatigue. Negative for chills and fever.   HENT:  Negative for congestion, ear pain, rhinorrhea and sore throat.    Eyes:  Positive for visual disturbance.   Respiratory:  Negative for cough, shortness of breath and wheezing.    Cardiovascular:  Positive for palpitations. Negative for chest pain and leg swelling.   Gastrointestinal:  Positive for diarrhea. Negative for abdominal pain, blood in stool, nausea and vomiting.   Genitourinary:  Negative for difficulty urinating, dysuria, hematuria, menstrual problem, vaginal bleeding, vaginal discharge and vaginal pain.   Musculoskeletal:  Positive for arthralgias and back pain. Negative for myalgias.   Skin:  Negative for rash.   Neurological:  Negative for dizziness, weakness, light-headedness, numbness and headaches.   Hematological:  Negative for adenopathy.   Psychiatric/Behavioral:  Negative for confusion.         Objective:   Vitals:  BP (!) 143/91 (BP Site: Left arm, Patient Position: Sitting, Cuff Size: Large)   Pulse 93  Temp 98.3 F (36.8 C) (Temporal)   Ht 1.69 m (5' 6.54")   Wt 143.4 kg (316 lb 3.2 oz)   LMP  (LMP Unknown)   SpO2 97%   BMI 50.22 kg/m     Physical Exam  Constitutional:       General: She is not in acute distress.     Appearance: She is obese.   HENT:      Head: Normocephalic.      Right Ear: Tympanic membrane normal.      Left Ear: Tympanic membrane normal.      Nose: Nose normal.      Mouth/Throat:      Mouth: Mucous membranes are dry.      Pharynx: Oropharynx is clear.   Eyes:      Conjunctiva/sclera: Conjunctivae normal.      Pupils: Pupils are equal, round, and reactive to light.   Neck:      Comments:  Submandibular lymph node swelling, warmth, and tenderness to palpation  Cardiovascular:      Rate and Rhythm: Normal rate and regular rhythm.      Pulses: Normal pulses.      Heart sounds: Normal heart sounds.   Pulmonary:      Effort: Pulmonary effort is normal.      Breath sounds: Normal breath sounds.   Abdominal:      General: Bowel sounds are normal. There is no distension.      Palpations: Abdomen is soft. There is no mass.      Tenderness: There is no abdominal tenderness. There is no guarding.   Musculoskeletal:         General: Normal range of motion.      Cervical back: Normal range of motion and neck supple.      Right lower leg: No edema.      Left lower leg: No edema.   Lymphadenopathy:      Cervical: Cervical adenopathy present.   Skin:     General: Skin is warm and dry.   Neurological:      Mental Status: She is alert and oriented to person, place, and time.   Psychiatric:         Mood and Affect: Mood normal.         Behavior: Behavior normal.          Assessment/Plan:       1. Annual physical exam    2. Behcet's disease  - ondansetron (ZOFRAN-ODT) 4 MG disintegrating tablet; Take 1 tablet (4 mg) by mouth every 8 (eight) hours as needed for Nausea  Dispense: 30 tablet; Refill: 2    3. Anxiety  - busPIRone (BUSPAR) 5 MG tablet; Take 1 tablet (5 mg) by mouth daily  Dispense: 90 tablet; Refill: 3    4. Chronic cholecystitis  - colesevelam (WELCHOL) 625 MG tablet; Take 2 tablets (1,250 mg) by mouth every morning  Dispense: 180 tablet; Refill: 3    5. Non-seasonal allergic rhinitis, unspecified trigger  - hydrOXYzine (ATARAX) 10 MG tablet; Take 1 tablet (10 mg) by mouth nightly  Dispense: 90 tablet; Refill: 3    6. Aphthous ulcer  - ondansetron (ZOFRAN-ODT) 4 MG disintegrating tablet; Take 1 tablet (4 mg) by mouth every 8 (eight) hours as needed for Nausea  Dispense: 30 tablet; Refill: 2    7. Nausea  - ondansetron (ZOFRAN-ODT) 4 MG disintegrating tablet; Take 1 tablet (4 mg) by mouth every 8 (eight)  hours as needed for Nausea  Dispense: 30 tablet; Refill: 2    8. Vitamin D deficiency  - Vitamin D,25 OH, Total    9. Palpitations  - TSH    10. History of allergic reaction  - EPINEPHrine (EPIPEN 2-PAK) 0.3 MG/0.3ML Solution Auto-injector injection; Inject 0.3 mLs (0.3 mg) into the muscle once as needed (Anaphylaxis)  Dispense: 2 each; Refill: 2    11. Screening for hyperlipidemia  - Lipid panel    12. Generalized abdominal pain  - dicyclomine (BENTYL) 10 MG capsule; Take 1 capsule (10 mg) by mouth 4 times daily - with meals and at bedtime for 20 doses  Dispense: 20 capsule; Refill: 0      Health Maintenance:  Recommend optimizing low carbohydrate diet efforts and obtaining at least 150 minutes of aerobic exercise per week. Recommend 20-25 grams of dietary fiber daily. Recommend drinking at least 60-80 ounces of water per day.  Immunizations UTD. Vision screening UTD. Dental Screening UTD. Mammogram screening is UTD. Cervical cancer screening is UTD. Gynecologist surveillance is UTD. HCV screening is UTD.    Anxiety: Start on buspar 5mg  daily instead of prn. Will f/u in 1 month to see if we need to adjust the dose.    Return in about 4 weeks (around 02/20/2023) for medication f/u.    Loletha Grayer, MD

## 2023-01-23 NOTE — Progress Notes (Signed)
Have you seen any specialists/other providers since your last visit with [place]?    Yes, ophthalmology, GI, Dermatology, Rheumatology,      The patient was informed that the following HM items are still outstanding:   There are no preventive care reminders to display for this patient.

## 2023-01-24 ENCOUNTER — Inpatient Hospital Stay: Payer: BLUE CROSS/BLUE SHIELD

## 2023-01-26 ENCOUNTER — Inpatient Hospital Stay: Payer: BLUE CROSS/BLUE SHIELD | Admitting: Rehabilitative and Restorative Service Providers"

## 2023-01-27 ENCOUNTER — Emergency Department: Payer: BLUE CROSS/BLUE SHIELD

## 2023-01-27 ENCOUNTER — Observation Stay: Payer: BLUE CROSS/BLUE SHIELD

## 2023-01-27 ENCOUNTER — Observation Stay
Admission: EM | Admit: 2023-01-27 | Discharge: 2023-01-28 | Disposition: A | Discharge status: Home / Self Care | Payer: BLUE CROSS/BLUE SHIELD | Attending: Hospitalist | Admitting: Hospitalist

## 2023-01-27 DIAGNOSIS — Z8679 Personal history of other diseases of the circulatory system: Secondary | ICD-10-CM | POA: Insufficient documentation

## 2023-01-27 DIAGNOSIS — R000 Tachycardia, unspecified: Secondary | ICD-10-CM | POA: Insufficient documentation

## 2023-01-27 DIAGNOSIS — D75839 Thrombocytosis, unspecified: Secondary | ICD-10-CM | POA: Insufficient documentation

## 2023-01-27 DIAGNOSIS — D72829 Elevated white blood cell count, unspecified: Secondary | ICD-10-CM | POA: Insufficient documentation

## 2023-01-27 DIAGNOSIS — Z90710 Acquired absence of both cervix and uterus: Secondary | ICD-10-CM | POA: Insufficient documentation

## 2023-01-27 DIAGNOSIS — G43909 Migraine, unspecified, not intractable, without status migrainosus: Secondary | ICD-10-CM | POA: Insufficient documentation

## 2023-01-27 DIAGNOSIS — D751 Secondary polycythemia: Secondary | ICD-10-CM | POA: Insufficient documentation

## 2023-01-27 DIAGNOSIS — E538 Deficiency of other specified B group vitamins: Secondary | ICD-10-CM | POA: Insufficient documentation

## 2023-01-27 DIAGNOSIS — R4701 Aphasia: Secondary | ICD-10-CM

## 2023-01-27 DIAGNOSIS — M352 Behcet's disease: Secondary | ICD-10-CM | POA: Insufficient documentation

## 2023-01-27 DIAGNOSIS — R404 Transient alteration of awareness: Secondary | ICD-10-CM | POA: Insufficient documentation

## 2023-01-27 DIAGNOSIS — E860 Dehydration: Secondary | ICD-10-CM | POA: Insufficient documentation

## 2023-01-27 DIAGNOSIS — E785 Hyperlipidemia, unspecified: Secondary | ICD-10-CM | POA: Insufficient documentation

## 2023-01-27 DIAGNOSIS — R479 Unspecified speech disturbances: Secondary | ICD-10-CM

## 2023-01-27 DIAGNOSIS — H209 Unspecified iridocyclitis: Secondary | ICD-10-CM | POA: Insufficient documentation

## 2023-01-27 DIAGNOSIS — Z6841 Body Mass Index (BMI) 40.0 and over, adult: Secondary | ICD-10-CM | POA: Insufficient documentation

## 2023-01-27 DIAGNOSIS — Z79899 Other long term (current) drug therapy: Secondary | ICD-10-CM | POA: Insufficient documentation

## 2023-01-27 LAB — CBC AND DIFFERENTIAL
Absolute NRBC: 0 10*3/uL (ref 0.00–0.00)
Basophils Absolute Automated: 0.06 10*3/uL (ref 0.00–0.08)
Basophils Automated: 0.5 %
Eosinophils Absolute Automated: 0.12 10*3/uL (ref 0.00–0.44)
Eosinophils Automated: 1 %
Hematocrit: 44.8 % — ABNORMAL HIGH (ref 34.7–43.7)
Hgb: 15 g/dL — ABNORMAL HIGH (ref 11.4–14.8)
Immature Granulocytes Absolute: 0.07 10*3/uL (ref 0.00–0.07)
Immature Granulocytes: 0.6 %
Instrument Absolute Neutrophil Count: 6.71 10*3/uL — ABNORMAL HIGH (ref 1.10–6.33)
Lymphocytes Absolute Automated: 4.77 10*3/uL — ABNORMAL HIGH (ref 0.42–3.22)
Lymphocytes Automated: 38.1 %
MCH: 31.4 pg (ref 25.1–33.5)
MCHC: 33.5 g/dL (ref 31.5–35.8)
MCV: 93.7 fL (ref 78.0–96.0)
MPV: 8.4 fL — ABNORMAL LOW (ref 8.9–12.5)
Monocytes Absolute Automated: 0.78 10*3/uL (ref 0.21–0.85)
Monocytes: 6.2 %
Neutrophils Absolute: 6.71 10*3/uL — ABNORMAL HIGH (ref 1.10–6.33)
Neutrophils: 53.6 %
Nucleated RBC: 0 /100 WBC (ref 0.0–0.0)
Platelets: 458 10*3/uL — ABNORMAL HIGH (ref 142–346)
RBC: 4.78 10*6/uL (ref 3.90–5.10)
RDW: 13 % (ref 11–15)
WBC: 12.51 10*3/uL — ABNORMAL HIGH (ref 3.10–9.50)

## 2023-01-27 LAB — POCT PREGNANCY TEST, URINE HCG: POCT Pregnancy HCG Test, UR: NEGATIVE

## 2023-01-27 LAB — URINALYSIS WITH REFLEX TO MICROSCOPIC EXAM - REFLEX TO CULTURE
Bilirubin, UA: NEGATIVE
Blood, UA: NEGATIVE
Glucose, UA: NEGATIVE
Leukocyte Esterase, UA: NEGATIVE
Nitrite, UA: NEGATIVE
Specific Gravity UA: 1.034 (ref 1.001–1.035)
Urine pH: 5.5 (ref 5.0–8.0)
Urobilinogen, UA: 2 mg/dL — AB

## 2023-01-27 LAB — BASIC METABOLIC PANEL
Anion Gap: 10 (ref 5.0–15.0)
BUN: 12 mg/dL (ref 7.0–21.0)
CO2: 25 mEq/L (ref 17–29)
Calcium: 10.4 mg/dL (ref 8.5–10.5)
Chloride: 102 mEq/L (ref 99–111)
Creatinine: 0.8 mg/dL (ref 0.4–1.0)
Glucose: 99 mg/dL (ref 70–100)
Potassium: 4 mEq/L (ref 3.5–5.3)
Sodium: 137 mEq/L (ref 135–145)
eGFR: >60 mL/min/{1.73_m2} (ref >=60)

## 2023-01-27 LAB — HEMOLYSIS INDEX(SOFT): Hemolysis Index: 25 Index — ABNORMAL HIGH (ref 0–24)

## 2023-01-27 LAB — VITAMIN B12: Vitamin B-12: 297 pg/mL (ref 211–911)

## 2023-01-27 LAB — C-REACTIVE PROTEIN: C-Reactive Protein: <0.1 mg/dL (ref 0.0–1.1)

## 2023-01-27 LAB — SEDIMENTATION RATE: Sed Rate: 25 mm/Hr — ABNORMAL HIGH (ref 0–20)

## 2023-01-27 LAB — PT AND APTT
PT INR: 1 (ref 0.9–1.1)
PT: 11.3 s (ref 10.1–12.9)
PTT: 32 s (ref 27–39)

## 2023-01-27 MED ORDER — ASPIRIN 300 MG RE SUPP
300.0000 mg | Freq: Every day | RECTAL | Status: DC
Start: 2023-01-27 — End: 2023-01-28

## 2023-01-27 MED ORDER — MELATONIN 3 MG PO TABS
3.0000 mg | ORAL_TABLET | Freq: Every evening | ORAL | Status: DC | PRN
Start: 2023-01-27 — End: 2023-01-28

## 2023-01-27 MED ORDER — ACETAMINOPHEN 325 MG PO TABS
650.0000 mg | ORAL_TABLET | Freq: Once | ORAL | Status: AC
Start: 2023-01-27 — End: 2023-01-27
  Administered 2023-01-27: 650 mg via ORAL
  Filled 2023-01-27: qty 2

## 2023-01-27 MED ORDER — BENZONATATE 100 MG PO CAPS
100.0000 mg | ORAL_CAPSULE | Freq: Three times a day (TID) | ORAL | Status: DC | PRN
Start: 2023-01-27 — End: 2023-01-28

## 2023-01-27 MED ORDER — SALINE SPRAY 0.65 % NA SOLN
2.0000 | NASAL | Status: DC | PRN
Start: 2023-01-27 — End: 2023-01-28

## 2023-01-27 MED ORDER — IOHEXOL 350 MG/ML IV SOLN
100.0000 mL | Freq: Once | INTRAVENOUS | Status: AC | PRN
Start: 2023-01-27 — End: 2023-01-27
  Administered 2023-01-27: 75 mL via INTRAVENOUS

## 2023-01-27 MED ORDER — NALOXONE HCL 0.4 MG/ML IJ SOLN (WRAP)
0.2000 mg | INTRAMUSCULAR | Status: DC | PRN
Start: 2023-01-27 — End: 2023-01-28

## 2023-01-27 MED ORDER — ENOXAPARIN SODIUM 40 MG/0.4ML IJ SOSY
40.0000 mg | PREFILLED_SYRINGE | Freq: Every day | INTRAMUSCULAR | Status: DC
Start: 2023-01-27 — End: 2023-01-28
  Administered 2023-01-27 – 2023-01-28 (×2): 40 mg via SUBCUTANEOUS
  Filled 2023-01-27 (×2): qty 0.4

## 2023-01-27 MED ORDER — MAGNESIUM SULFATE IN D5W 1-5 GM/100ML-% IV SOLN
1.0000 g | INTRAVENOUS | Status: DC | PRN
Start: 2023-01-27 — End: 2023-01-28

## 2023-01-27 MED ORDER — ACETAMINOPHEN 325 MG PO TABS
650.0000 mg | ORAL_TABLET | Freq: Three times a day (TID) | ORAL | Status: DC | PRN
Start: 2023-01-27 — End: 2023-01-28

## 2023-01-27 MED ORDER — GLUCAGON 1 MG IJ SOLR (WRAP)
1.0000 mg | INTRAMUSCULAR | Status: DC | PRN
Start: 2023-01-27 — End: 2023-01-28

## 2023-01-27 MED ORDER — DEXTROSE 50 % IV SOLN
12.5000 g | INTRAVENOUS | Status: DC | PRN
Start: 2023-01-27 — End: 2023-01-28

## 2023-01-27 MED ORDER — HYDRALAZINE HCL 20 MG/ML IJ SOLN
10.0000 mg | INTRAMUSCULAR | Status: DC | PRN
Start: 2023-01-27 — End: 2023-01-28

## 2023-01-27 MED ORDER — DIAZEPAM 5 MG/ML IJ SOLN
5.0000 mg | Freq: Once | INTRAMUSCULAR | Status: AC
Start: 2023-01-27 — End: 2023-01-28
  Administered 2023-01-28: 5 mg via INTRAVENOUS
  Filled 2023-01-27: qty 1

## 2023-01-27 MED ORDER — SODIUM CHLORIDE 0.9 % IV SOLN
INTRAVENOUS | Status: DC
Start: 2023-01-27 — End: 2023-01-28

## 2023-01-27 MED ORDER — POTASSIUM CHLORIDE CRYS ER 20 MEQ PO TBCR
0.0000 meq | EXTENDED_RELEASE_TABLET | ORAL | Status: DC | PRN
Start: 2023-01-27 — End: 2023-01-28

## 2023-01-27 MED ORDER — POTASSIUM & SODIUM PHOSPHATES 280-160-250 MG PO PACK
2.0000 | PACK | ORAL | Status: DC | PRN
Start: 2023-01-27 — End: 2023-01-28

## 2023-01-27 MED ORDER — POTASSIUM CHLORIDE 10 MEQ/100ML IV SOLN
10.0000 meq | INTRAVENOUS | Status: DC | PRN
Start: 2023-01-27 — End: 2023-01-28

## 2023-01-27 MED ORDER — GLUCOSE 40 % PO GEL (WRAP)
15.0000 g | ORAL | Status: DC | PRN
Start: 2023-01-27 — End: 2023-01-28

## 2023-01-27 MED ORDER — ASPIRIN 81 MG PO CHEW
81.0000 mg | CHEWABLE_TABLET | Freq: Every day | ORAL | Status: DC
Start: 2023-01-27 — End: 2023-01-28
  Administered 2023-01-27 – 2023-01-28 (×2): 81 mg via ORAL
  Filled 2023-01-27 (×2): qty 1

## 2023-01-27 MED ORDER — DEXTROSE 10 % IV BOLUS
12.5000 g | INTRAVENOUS | Status: DC | PRN
Start: 2023-01-27 — End: 2023-01-28

## 2023-01-27 MED ORDER — BENZOCAINE-MENTHOL MT LOZG (WRAP)
1.0000 | LOZENGE | OROMUCOSAL | Status: DC | PRN
Start: 2023-01-27 — End: 2023-01-28

## 2023-01-27 MED ORDER — CARBOXYMETHYLCELLULOSE SOD PF 0.5 % OP SOLN
1.0000 [drp] | Freq: Three times a day (TID) | OPHTHALMIC | Status: DC | PRN
Start: 2023-01-27 — End: 2023-01-28

## 2023-01-27 MED ORDER — LABETALOL HCL 5 MG/ML IV SOLN (WRAP)
10.0000 mg | INTRAVENOUS | Status: DC | PRN
Start: 2023-01-27 — End: 2023-01-28

## 2023-01-27 MED ORDER — ATORVASTATIN CALCIUM 40 MG PO TABS
40.0000 mg | ORAL_TABLET | Freq: Every evening | ORAL | Status: DC
Start: 2023-01-27 — End: 2023-01-28
  Administered 2023-01-27: 40 mg via ORAL
  Filled 2023-01-27: qty 1

## 2023-01-27 NOTE — ED Provider Notes (Signed)
Initial brief evaluation done in triage to expedite care but I am not the primary provider. Pt presents to triage with headache unlike her usual migraines, with episode of aphasia lasting 83minutes 2 nights ago. Currently being treated for uveitis.      Webb Laws, MD  01/27/23 [phone]

## 2023-01-27 NOTE — ED Provider Notes (Addendum)
[facility] EMERGENCY DEPARTMENT H&P      Visit date: 01/27/2023      CLINICAL SUMMARY          Diagnosis:    .     Final diagnoses:   Speech disturbance, unspecified type         MDM Notes:        42 year old female presenting with acute speech abnormality that has since completely resolved that happened 2 days ago.  She is out of the window for any thrombolytics, and is not a candidate for LVO.  Her CT head is negative.  Her NIHSS is 0.  Discussed with CNS hospitalist, accepting admission    Medical Decision Making  Risk  OTC drugs.             Disposition:         Observation Admit      ED Disposition       ED Disposition   Expedited Observation    Condition   --    Date/Time   Fri Jan 27, 2023  5:04 PM    Comment   Admitting Physician: Anice Paganini [[id number]]   Estimated Length of Stay: < 2 midnights   Tentative Discharge Plan?: Home or Self Care [1]                             CLINICAL INFORMATION        HPI:      Chief Complaint: Aphasia  .    Koula Taneshia Lorence is a 42 y.o. female who presents with speech abnormality that occurred 2 nights ago.  She states that she thought she was speaking but her husband says that there were no words coming out.  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  She was seen by an ophthalmologist and started on eyedrops.  The ophthalmologist notes that the inflammation in her eyes is improving, but she continues to have blurry vision.  She has a headache, but attributes this to her uveitis.  She has a history of migraines, but this headache is different than her previous migraines.  She had no other numbness or weakness in the arms or legs.  Her speech has since been clear.  She has a history of Behcet's disease, and recently had immunotherapy on Monday (5 days ago).    History obtained from: Patient          ROS:      Positive and negative ROS elements as per HPI.  All  other systems reviewed and negative.      Physical Exam:      Pulse (!) 123  BP 144/84  Resp 20  SpO2 96 %  Temp 98.1 F (36.7 C)    Physical Exam  Vitals and nursing note reviewed.   Constitutional:       General: She is not in acute distress.     Appearance: She is not toxic-appearing.   HENT:      Head: Normocephalic and atraumatic.      Nose: Nose normal.      Mouth/Throat:      Mouth: Mucous membranes are moist.   Eyes:      Extraocular Movements: Extraocular movements intact.      Pupils: Pupils are equal, round, and reactive to light.   Cardiovascular:  Rate and Rhythm: Regular rhythm. Tachycardia present.   Pulmonary:      Effort: Pulmonary effort is normal. No respiratory distress.   Musculoskeletal:         General: Normal range of motion.      Cervical back: Normal range of motion and neck supple.   Skin:     General: Skin is warm and dry.      Findings: No rash.   Neurological:      Mental Status: She is alert and oriented to person, place, and time.      Comments: Clear speech, no facial droop, 5/5 motor in all extremities, normal finger to nose, no pronator drift                   PAST HISTORY        Primary Care Provider: Virgilio Frees, MD        PMH/PSH:    .     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 Adin Hector    BMI 45.0-49.9, adult     Fever 11/09/2022    Headache     migraine    Hx of ovarian cyst     Lower back pain     Meningitis 2011    Viral // treated and resolved    Pain in wrist 11/09/2022    Post-operative nausea and vomiting     Scoliosis     moderate// used a Brace at a young age    Uveitis        She has a past surgical history that includes Wisdom tooth extraction (1998); D&C DIAGNOSTIC (2010); Ganglion cyst excision (2014); Umbilical hernia repair (2016); Finger ganglion cyst excision (04/2018); BIOPSY, LYMPH NODE (N/A, 07/23/2018); Cholecystectomy (2009); Dilation  and curettage of uterus (2010); Hysterectomy (2012); Hysteroscopy (2011); Upper gastrointestinal endoscopy (2009); Hernia repair (2016); Bone marrow biopsy (2020); ARTHROTOMY, WRIST (Right, 12/03/2020); CARDIAC ABLATION (2005); Ablation - PVC's (N/A, 06/08/2022); LAPAROSCOPY, DIAGNOSTIC (N/A, 11/09/2022); LAPAROSCOPIC, LYSIS, ADHESIONS (N/A, 11/09/2022); and LAPAROSCOPIC, SALPINGECTOMY (Left, 11/09/2022).      Social/Family History:      She reports that she has never smoked. She has never used smokeless tobacco. She reports that she does not drink alcohol and does not use drugs.    Family History   Adopted: Yes         Listed Medications on Arrival:    .     Home Medications               Adalimumab (Humira) 40 MG/0.4ML Prefilled Syringe Kit     Inject 0.4 mLs (40 mg) into the skin every 10 (ten) days     Patient not taking: Reported on 01/23/2023     Adalimumab (Humira, 2 Pen,) 40 MG/0.4ML Pen-injector Kit     Inject 0.4 mLs (40 mg) into the skin once a week Pen-injectors     Patient not taking: Reported on 01/23/2023     albuterol sulfate HFA (PROVENTIL) 108 (90 Base) MCG/ACT inhaler     Inhale 2 puffs into the lungs every 4 (four) hours as needed for Shortness of Breath     Patient not taking: Reported on 01/02/2023     Apremilast (Otezla) 30 MG Tab     Take 1 tablet (30 mg) by mouth 2 (two) times  daily     Azelastine-Fluticasone 137-50 MCG/ACT Suspension     1 spray by Nasal route 2 (two) times daily     botulinum Toxin Type A (Botox) 200 units Recon Soln injection     Inject 155 units IM every 12 weeks     busPIRone (BUSPAR) 5 MG tablet     Take 1 tablet (5 mg) by mouth daily     clindamycin (CLEOCIN T) 1 % lotion     Apply topically every morning     colesevelam (WELCHOL) 625 MG tablet     Take 2 tablets (1,250 mg) by mouth every morning     cyclobenzaprine (FLEXERIL) 10 MG tablet     Take 1 tablet (10 mg) by mouth 3 (three) times daily as needed for Muscle spasms     Patient not taking: Reported on 01/02/2023      Dapsone 7.5 % Gel     Apply topically every morning     dexAMETHasone (DECADRON) 0.1 % ophthalmic solution     Place 1 drop into both eyes 3 (three) times daily     diazePAM (VALIUM) 2 MG tablet     TAKE 1 TAB 30 MIN BEFORE MRI APPOINTMENT AND ONE TAB RIGHT BEFORE     Patient not taking: Reported on 01/23/2023     dicyclomine (BENTYL) 10 MG capsule     Take 1 capsule (10 mg) by mouth 4 times daily - with meals and at bedtime for 20 doses     EPINEPHrine (EPIPEN 2-PAK) 0.3 MG/0.3ML Solution Auto-injector injection     Inject 0.3 mLs (0.3 mg) into the muscle once as needed (Anaphylaxis)     GaviLyte-G oral solution     TAKE 4,000 MLS BY MOUTH ONCE FOR 1 DOSE FOLLOW PREP INSTRUCTIONS PROVIDED BY YOUR GASTROENTEROLOGIST     Patient not taking: Reported on 01/23/2023     hydrOXYzine (ATARAX) 10 MG tablet     Take 1 tablet (10 mg) by mouth nightly     ibuprofen (ADVIL) 200 MG tablet     Take 3 tablets (600 mg) by mouth every 6 (six) hours as needed for Pain     Patient not taking: Reported on 01/23/2023     inFLIXimab 100 MG injection     Infuse into the vein     Jublia 10 % Solution          lidocaine-prilocaine (EMLA) cream     Apply topically as needed Dermatology procedures     ondansetron (ZOFRAN-ODT) 4 MG disintegrating tablet     Take 1 tablet (4 mg) by mouth every 8 (eight) hours as needed for Nausea     Rimegepant Sulfate 75 MG Tablet Dispersible     Take 1 tablet (75 mg) by mouth every other day For migraine prevention     rizatriptan (MAXALT) 10 MG tablet     Take 1-2 tablets (10-20 mg total) by mouth once as needed for Migraine Max 3 days a week     tiZANidine (ZANAFLEX) 2 MG tablet     Take 1 tablet (2 mg) by mouth every 8 (eight) hours as needed     Patient not taking: Reported on 01/23/2023     valACYclovir (VALTREX) 1000 MG tablet     Take 2 tabs at onset; repeat once in 12 hours.  Total 4 tabs per outbreak           Allergies: She is allergic to fruit blend flavor [flavoring agent], cyanoacrylate, latex,  adhesive [wound dressing adhesive], other, and oxycodone.            VISIT INFORMATION        Clinical Course in the ED:             Medications Given in the ED:    .     ED Medication Orders (From admission, onward)      Start Ordered     Status Ordering Provider    01/27/23 2200 01/27/23 1739  atorvastatin (LIPITOR) tablet 40 mg  At bedtime        Route: Oral  Ordered Dose: 40 mg       Last MAR action: Given SAIDI, MUSTAFA    01/27/23 1820 01/27/23 1820  iohexol (OMNIPAQUE) 350 MG/ML injection 100 mL  IMG once as needed        Route: Intravenous  Ordered Dose: 100 mL       Last MAR action: Imaging Agent Given SAIDI, MUSTAFA    01/27/23 1814 01/27/23 1813  diazePAM (VALIUM) injection 5 mg  Once        Route: Intravenous  Ordered Dose: 5 mg       Acknowledged SAIDI, MUSTAFA    01/27/23 1803 01/27/23 1802  0.9% NaCl infusion  Continuous        Route: Intravenous       Last MAR action: New Bag SAIDI, MUSTAFA    01/27/23 1740 01/27/23 1739  aspirin chewable tablet 81 mg  Daily        Route: Oral  Ordered Dose: 81 mg      Placed in "Or" Linked Group    Last MAR action: Given SAIDI, MUSTAFA    01/27/23 1740 01/27/23 1739  aspirin suppository 300 mg  Daily        Route: Rectal  Ordered Dose: 300 mg      Placed in "Or" Linked Group    Last MAR action: See Alternative SAIDI, MUSTAFA    01/27/23 1740 01/27/23 1739  enoxaparin (LOVENOX) syringe 40 mg  Daily        Route: Subcutaneous  Ordered Dose: 40 mg       Last MAR action: Given SAIDI, MUSTAFA    01/27/23 1738 01/27/23 1739  acetaminophen (TYLENOL) tablet 650 mg  3 times daily PRN        Route: Oral  Ordered Dose: 650 mg       Acknowledged SAIDI, MUSTAFA    01/27/23 1737 01/27/23 1739  labetalol (NORMODYNE,TRANDATE) injection 10 mg  Every 15 min PRN        Route: Intravenous  Ordered Dose: 10 mg       Acknowledged SAIDI, MUSTAFA    01/27/23 1737 01/27/23 1739  hydrALAZINE (APRESOLINE) injection 10 mg  Every 3 hours PRN        Route: Intravenous  Ordered Dose: 10 mg        Acknowledged SAIDI, MUSTAFA    01/27/23 1736 01/27/23 1739  benzocaine-menthol (CEPACOL/CHLORASEPTIC) lozenge 1 lozenge  Every 2 hours PRN        Route: Buccal  Ordered Dose: 1 lozenge       Acknowledged SAIDI, MUSTAFA    01/27/23 1736 01/27/23 1739  benzonatate (TESSALON) capsule 100 mg  3 times daily PRN        Route: Oral  Ordered Dose: 100 mg       Acknowledged SAIDI, MUSTAFA    01/27/23 1736 01/27/23 1739  magnesium sulfate 1g in dextrose 5% IVPB (premix)  As needed        Route: Intravenous  Ordered Dose: 1 g       Acknowledged SAIDI, MUSTAFA    01/27/23 1736 01/27/23 1739  potassium chloride (KLOR-CON M20) CR tablet 0-40 mEq  As needed        Route: Oral  Ordered Dose: 0-40 mEq      Placed in "And" Linked Group    Acknowledged SAIDI, MUSTAFA    01/27/23 1736 01/27/23 1739  potassium chloride 10 mEq in 100 mL IVPB (premix)  As needed        Route: Intravenous  Ordered Dose: 10 mEq      Placed in "And" Linked Group    Acknowledged SAIDI, MUSTAFA    01/27/23 1736 01/27/23 1739  potassium & sodium phosphates (PHOS-NAK) 280-160-250 MG packet 2 packet  As needed        Route: Oral  Ordered Dose: 2 packet       Acknowledged SAIDI, MUSTAFA    01/27/23 1736 01/27/23 1739  naloxone (NARCAN) injection 0.2 mg  As needed        Route: Intravenous  Ordered Dose: 0.2 mg       Acknowledged SAIDI, MUSTAFA    01/27/23 1736 01/27/23 1739  dextrose (GLUCOSE) 40 % oral gel 15 g of glucose  As needed        Route: Oral  Ordered Dose: 15 g of glucose      Placed in "Or" Linked Group    Acknowledged SAIDI, MUSTAFA    01/27/23 1736 01/27/23 1739  dextrose (D10W) 10% bolus 125 mL  As needed        Route: Intravenous  Ordered Dose: 12.5 g      Placed in "Or" Linked Group    Acknowledged SAIDI, MUSTAFA    01/27/23 1736 01/27/23 1739  dextrose 50 % bolus 12.5 g  As needed        Route: Intravenous  Ordered Dose: 12.5 g      Placed in "Or" Linked Group    Acknowledged SAIDI, MUSTAFA    01/27/23 1736 01/27/23 1739  glucagon (rDNA)  (GLUCAGEN) injection 1 mg  As needed        Route: Intramuscular  Ordered Dose: 1 mg      Placed in "Or" Linked Group    Acknowledged SAIDI, MUSTAFA    01/27/23 1736 01/27/23 1739  melatonin tablet 3 mg  At bedtime PRN        Route: Oral  Ordered Dose: 3 mg       Acknowledged SAIDI, MUSTAFA    01/27/23 1736 01/27/23 1739  saline (OCEAN NASAL SPRAY) 0.65 % nasal solution 2 spray  Every 4 hours PRN        Route: Each Nare  Ordered Dose: 2 spray       Acknowledged SAIDI, MUSTAFA    01/27/23 1736 01/27/23 1739  carboxymethylcellulose (PF) (REFRESH PLUS) 0.5 % ophthalmic solution 1 drop  3 times daily PRN        Route: Both Eyes  Ordered Dose: 1 drop       Acknowledged SAIDI, MUSTAFA    01/27/23 1700 01/27/23 1659  acetaminophen (TYLENOL) tablet 650 mg  Once        Route: Oral  Ordered Dose: 650 mg       Last MAR action: Given Giuseppina Quinones A              Procedures:      Procedures  Interpretations:      O2 Sat:  The patient's oxygen saturation was 96 % on room air. This was independently interpreted by me as Normal.       I reviewed the past medical history, past surgical history, and social history.    Differential Dx (not completely inclusive): Behcet's disease, complicated migraine, TIA    Laboratory results reviewed by EDP: Yes  Radiologic study results reviewed by EDP: Yes        NIH Stroke Score      Flowsheet Row Most Recent Value   NIH Stroke Scale    Interval Shift Assessment   1a. Level of Consciousness 0   1b. LOC Questions (age, month) 0   1c. LOC Commands (Open and close eyes, Grip AND release good hand) 0   2. Best Gaze 0   3. Visual Fields 0   4. Facial Palsy 0   5a. Motor Left Arm: (Arms with palm down X 10 seconds. Sitting = arms at 90 degrees. Supine = arms 45 degrees) 0   5b. Motor Right Arm: (Arms with palm down X 10 seconds. Sitting = arms at 90 degrees Supine = arms 45 degrees) 0   Total Motor Arms 0   6a. Motor Left Leg: (Leg elevated X 5 seconds Supine = Leg 30 degrees) 0   6b. Motor Right  Leg: (Leg elevated X 5 seconds Supine = Leg 30 degrees) 0   Total Motor Legs 0   7. Limb Ataxia (Finger to nose, heel to shin) 0   8. Sensory (Sensation or grimace to pin prick on face, arm, trunk, and leg) 0   9. Best language (Describe picture, name items, read sentences from NIHSS booklet) 0   10. Dysarthria (Read list from NIHSS booklet) 0   11. Extinction and Inattention (formerly Neglect) - Test tactile and visual stimulation 0   NIHSS Total 0                  RESULTS        Lab Results:      Results       Procedure Component Value Units Date/Time    Hemolysis index [[id number]]  (Abnormal) Collected: 01/27/23 1800     Updated: 01/27/23 2121     Hemolysis Index 25 Index     Vitamin B12 [[id number]] Collected: 01/27/23 1800    Specimen: Blood Updated: 01/27/23 2108    C Reactive Protein [[id number]] Collected: 01/27/23 1800    Specimen: Blood Updated: 01/27/23 1841     C-Reactive Protein <0.1 mg/dL     Sedimentation rate (ESR) [[id number]]  (Abnormal) Collected: 01/27/23 1800    Specimen: Blood Updated: 01/27/23 1826     Sed Rate 25 mm/Hr     Urinalysis Reflex to Microscopic Exam- Reflex to Culture [[id number]]  (Abnormal) Collected: 01/27/23 1437     Updated: 01/27/23 1519     Urine Type Urine, Clean Ca     Color, UA Yellow     Clarity, UA Hazy     Specific Gravity UA 1.034     Urine pH 5.5     Leukocyte Esterase, UA Negative     Nitrite, UA Negative     Protein, UR 20= Trace     Glucose, UA Negative     Ketones UA Trace     Urobilinogen, UA 2.0 mg/dL      Bilirubin, UA Negative     Blood, UA Negative     RBC, UA 0-2 /hpf  WBC, UA 0-5 /hpf      Squamous Epithelial Cells, Urine 26-50 /hpf      Calcium Oxalate Crystals, UA Occasional /hpf      Hyaline Casts, UA 6-10 /lpf      Urine Mucus Present    PT/APTT [[id number]] Collected: 01/27/23 1352     Updated: 01/27/23 1513     PT 11.3 sec      PT INR 1.0     PTT 32 sec     Basic Metabolic Panel [[id number]] Collected: 01/27/23 1352    Specimen: Blood Updated: 01/27/23  1443     Glucose 99 mg/dL      BUN 46.9 mg/dL      Creatinine 0.8 mg/dL      Calcium 62.9 mg/dL      Sodium 528 mEq/L      Potassium 4.0 mEq/L      Chloride 102 mEq/L      CO2 25 mEq/L      Anion Gap 10.0     eGFR >60.0 mL/min/1.73 m2     Urine HCG, POC/ Qualitative [[id number]] Collected: 01/27/23 1436    Specimen: Urine Updated: 01/27/23 1440     POCT QC Pass     POCT Pregnancy HCG Test, UR Negative     Comment: Negative Value is Normal in Healthy Males or Healthy non-pregnant Females    CBC and differential [[id number]]  (Abnormal) Collected: 01/27/23 1352    Specimen: Blood Updated: 01/27/23 1434     WBC 12.51 x10 3/uL      Hgb 15.0 g/dL      Hematocrit 03.4 %      Platelets 458 x10 3/uL      RBC 4.78 x10 6/uL      MCV 93.7 fL      MCH 31.4 pg      MCHC 33.5 g/dL      RDW 13 %      MPV 8.4 fL      Instrument Absolute Neutrophil Count 6.71 x10 3/uL      Neutrophils 53.6 %      Lymphocytes Automated 38.1 %      Monocytes 6.2 %      Eosinophils Automated 1.0 %      Basophils Automated 0.5 %      Immature Granulocytes 0.6 %      Nucleated RBC 0.0 /100 WBC      Neutrophils Absolute 6.71 x10 3/uL      Lymphocytes Absolute Automated 4.77 x10 3/uL      Monocytes Absolute Automated 0.78 x10 3/uL      Eosinophils Absolute Automated 0.12 x10 3/uL      Basophils Absolute Automated 0.06 x10 3/uL      Immature Granulocytes Absolute 0.07 x10 3/uL      Absolute NRBC 0.00 x10 3/uL                 Radiology Results:      CT Angiogram Head Neck   Final Result      1.There is no stenosis of the proximal right internal carotid artery based   on NASCET criteria.     2.There is no stenosis of the proximal left internal carotid artery based   on NASCET criteria.   3.The vertebrobasilar arterial system is patent.    4.Normal intracranial CT angiogram. There is no proximal intracranial large   vessel occlusion.      Milan Waunita Schooner, MD   01/27/2023 6:31 PM  CT Head without Contrast   Final Result       1.No intracranial  abnormality is seen.   2. Given the history and symptoms, follow-up MRI may be of benefit.      Theodoro Doing, MD   01/27/2023 2:29 PM      MRI Brain W WO Contrast    (Results Pending)   MRI Orbits Only W WO Contrast    (Results Pending)               Scribe Attestation:      No scribe involved in the care of this patient

## 2023-01-27 NOTE — H&P (Signed)
[facility] HOSPITALIST ADMISSION HISTORY AND PHYSICAL EXAM    Date Time: 01/27/23 5:53 PM  Patient Name: Hailey [name]  Attending Physician: Josem Kaufmann, MD  Primary Care Physician: Loletha Grayer, MD    CC: Episode of aphasia      History of Presenting Illness:   Hailey [name] is a 42 y.o. female who presents to the hospital with 1 episode of aphasia on Wednesday.  It lasted for about 2 to 3 minutes.  It was witnessed and her partner states that she was not able to speak at all.  Since then she did not have any symptoms other than blurry vision which seems to be ongoing since a long time.  She does not recall any trigger factors or any factors that made the symptoms worse or better.  She did not have any focal deficits.  She is at her baseline currently.  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  This was in 2005.  Later on she had another ablation for PVCs in 2023.    Past Medical History:     Past Medical History:   Diagnosis Date    Anxiety     Arrhythmia     WPW ablation in 2005, PVC ablation 8/23    Behcet's disease     She sees Dr. Lauro Regulus    BMI 45.0-49.9, adult     Fever 11/09/2022    Headache     migraine    Hx of ovarian cyst     Lower back pain     Meningitis 2011    Viral // treated and resolved    Pain in wrist 11/09/2022    Post-operative nausea and vomiting     Scoliosis     moderate// used a Brace at a young age    Uveitis        Past Surgical History:     Past Surgical History:   Procedure Laterality Date    ABLATION - PVC'S N/A 06/08/2022    Procedure: Ablation - PVC's;  Surgeon: Jaynie Bream, MD;  Location: [facility];  Service: Cardiovascular;  Laterality: N/A;  SAME DAY DISCHARGE  CARTO NO TEE    ARTHROTOMY, WRIST Right 12/03/2020    Procedure: RIGHT UPPER EXTREMITY SYNOVIAL BIOPSY;  Surgeon: Lenox Ahr, MD;  Location: [facility];  Service: Plastics;  Laterality: Right;    BIOPSY,  LYMPH NODE N/A 07/23/2018    Procedure: BIOPSY, LYMPH NODE;  Surgeon: Neldon Labella, MD;  Location: [facility];  Service: ENT;  Laterality: N/A;  NECK DEEP LYMPH NODE BIOPSY    BONE MARROW BIOPSY  2020    led to behcet's dx    CARDIAC ABLATION  2005    [facility] for Wolff-Parkinson-White    CHOLECYSTECTOMY  2009    D&C DIAGNOSTIC  2010    DILATION AND CURETTAGE OF UTERUS  2010    FINGER GANGLION CYST EXCISION  04/2018    GANGLION CYST EXCISION  2014    04/2018 cyst removed from index finger-[place]  2016    HYSTERECTOMY  2012    HYSTEROSCOPY  2011    indication: Abnormal bleeding    LAPAROSCOPIC, LYSIS, ADHESIONS N/A 11/09/2022    Procedure: LAPAROSCOPIC LYSIS OF ADHESIONS;  Surgeon: Lindell Noe, MD;  Location: [facility];  Service: Gynecology;  Laterality: N/A;  LAPAROSCOPIC, SALPINGECTOMY Left 11/09/2022    Procedure: LAPAROSCOPIC, SALPINGECTOMY LEFT;  Surgeon: Lindell Noe, MD;  Location: [facility];  Service: Gynecology;  Laterality: Left;    LAPAROSCOPY, DIAGNOSTIC N/A 11/09/2022    Procedure: LAPAROSCOPY, DIAGNOSTIC;  Surgeon: Lindell Noe, MD;  Location: [facility];  Service: Gynecology;  Laterality: N/A;  0000000    UMBILICAL HERNIA REPAIR  2016    UPPER GASTROINTESTINAL ENDOSCOPY  2009    WISDOM TOOTH EXTRACTION  1998       Family History:     Family History   Adopted: Yes   The patient was adopted, does not know about her family history    Social History:     Social History     Socioeconomic History    Marital status: Married     Spouse name: Not on file    Number of children: 0    Years of education: Not on file    Highest education level: Not on file   Occupational History    Occupation: Attorney    Tobacco Use    Smoking status: Never    Smokeless tobacco: Never   Vaping Use    Vaping status: Never Used   Substance and Sexual Activity    Alcohol use: Never    Drug use: Never    Sexual activity: Yes     Partners: Male      Birth control/protection: None     Comment: Hysterectomy   Other Topics Concern    Not on file   Social History Narrative    Diet: Non specific         Caffeine per day: tea 1 cup         ACD: N        Medical POA: N        DNR: N        Exercise: N     Social Determinants of Health     Financial Resource Strain: Low Risk  (12/05/2022)    Overall Financial Resource Strain (CARDIA)     Difficulty of Paying Living Expenses: Not hard at all   Food Insecurity: No Food Insecurity (01/27/2023)    Hunger Vital Sign     Worried About Running Out of Food in the Last Year: Never true     [place] in the Last Year: Never true   Transportation Needs: No Transportation Needs (12/05/2022)    PRAPARE - Armed forces logistics/support/administrative officer (Medical): No     Lack of Transportation (Non-Medical): No   Physical Activity: Unknown (12/05/2022)    Exercise Vital Sign     Days of Exercise per Week: Patient declined     Minutes of Exercise per Session: 10 min   Recent Concern: Physical Activity - Insufficiently Active (11/06/2022)    Exercise Vital Sign     Days of Exercise per Week: 1 day     Minutes of Exercise per Session: 10 min   Stress: Stress Concern Present (12/05/2022)    [place]     Feeling of Stress : To some extent   Social Connections: Moderately Integrated (12/05/2022)    Social Connection and Isolation Panel [NHANES]     Frequency of Communication with Friends and Family: Twice a week     Frequency of Social Gatherings with Friends and Family: Once a week     Attends Religious Services: More than 4  times per year     Active Member of Clubs or Organizations: No     Attends Archivist Meetings: Patient declined     Marital Status: Married   Human resources officer Violence: Not At Risk (01/27/2023)    Humiliation, Afraid, Rape, and Kick questionnaire     Fear of Current or Ex-Partner: No     Emotionally Abused: No     Physically Abused: No      Sexually Abused: No   Housing Stability: Low Risk  (12/05/2022)    Housing Stability Vital Sign     Unable to Pay for Housing in the Last Year: No     Number of [place] in the Last Year: 1     Unstable Housing in the Last Year: No       Allergies:     Allergies   Allergen Reactions    Fruit Blend Flavor [Flavoring Agent] Anaphylaxis and Swelling     Tropical Fruit     Cyanoacrylate     Latex Hives, Swelling, Other (See Comments) and Drug-Induced Flushing    Adhesive [Wound Dressing Adhesive] Itching    Other Itching and Rash     Sutures Monocryl    Oxycodone Hallucinations       Medications:     Prior to Admission medications    Medication Sig Start Date End Date Taking? Authorizing Provider   Adalimumab (Humira) 40 MG/0.4ML Prefilled Syringe Kit Inject 0.4 mLs (40 mg) into the skin every 10 (ten) days  Patient not taking: Reported on 01/23/2023 12/06/22   Mliss Sax, MD   Adalimumab (Humira, 2 Pen,) 40 MG/0.4ML Pen-injector Kit Inject 0.4 mLs (40 mg) into the skin once a week Pen-injectors  Patient not taking: Reported on 01/23/2023 12/12/22   Mliss Sax, MD   albuterol sulfate HFA (PROVENTIL) 108 (90 Base) MCG/ACT inhaler Inhale 2 puffs into the lungs every 4 (four) hours as needed for Shortness of Breath  Patient not taking: Reported on 01/02/2023 12/06/22   Loletha Grayer, MD   Apremilast [place]) 30 MG Tab Take 1 tablet (30 mg) by mouth 2 (two) times daily 11/11/22   Mliss Sax, MD   Azelastine-Fluticasone [phone] MCG/ACT Suspension 1 spray by Nasal route 2 (two) times daily 11/08/22   [provider]   botulinum Toxin Type A (Botox) 200 units Recon Soln injection Inject 155 units IM every 12 weeks 03/15/22   Aida Puffer, MD   busPIRone (BUSPAR) 5 MG tablet Take 1 tablet (5 mg) by mouth daily 01/23/23   Loletha Grayer, MD   clindamycin (CLEOCIN T) 1 % lotion Apply topically every morning 11/25/21   [provider]   colesevelam (WELCHOL) 625 MG tablet Take 2 tablets (1,250  mg) by mouth every morning 01/23/23   Loletha Grayer, MD   cyclobenzaprine (FLEXERIL) 10 MG tablet Take 1 tablet (10 mg) by mouth 3 (three) times daily as needed for Muscle spasms  Patient not taking: Reported on 01/02/2023 12/06/22   Loletha Grayer, MD   Dapsone 7.5 % Gel Apply topically every morning 10/12/21   [provider]   dexAMETHasone (DECADRON) 0.1 % ophthalmic solution INSTILL 1 DROP INTO BOTH EYES FOUR TIMES A DAY 01/16/23   [provider]   diazePAM (VALIUM) 2 MG tablet TAKE 1 TAB 30 MIN BEFORE MRI APPOINTMENT AND ONE TAB RIGHT BEFORE  Patient not taking: Reported on 01/23/2023 12/16/22   [provider]   dicyclomine (BENTYL) 10 MG capsule  Take 1 capsule (10 mg) by mouth 4 times daily - with meals and at bedtime for 20 doses 01/23/23 01/28/23  Loletha Grayer, MD   EPINEPHrine (EPIPEN 2-PAK) 0.3 MG/0.3ML Solution Auto-injector injection Inject 0.3 mLs (0.3 mg) into the muscle once as needed (Anaphylaxis) 01/23/23   Loletha Grayer, MD   GaviLyte-G oral solution TAKE 4,000 MLS BY MOUTH ONCE FOR 1 DOSE FOLLOW [place] BY YOUR GASTROENTEROLOGIST  Patient not taking: Reported on 01/23/2023 01/09/23   [provider]   hydrOXYzine (ATARAX) 10 MG tablet Take 1 tablet (10 mg) by mouth nightly 01/23/23   Loletha Grayer, MD   ibuprofen (ADVIL) 200 MG tablet Take 3 tablets (600 mg) by mouth every 6 (six) hours as needed for Pain  Patient not taking: Reported on 01/23/2023 11/08/22   Lindell Noe, MD   inFLIXimab 100 MG injection Infuse into the vein    [provider]   Jublia 10 % Solution  09/13/22   [provider]   lidocaine-prilocaine (EMLA) cream Apply topically as needed Dermatology procedures 11/23/21   [provider]   ondansetron (ZOFRAN-ODT) 4 MG disintegrating tablet Take 1 tablet (4 mg) by mouth every 8 (eight) hours as needed for Nausea 01/23/23   Loletha Grayer, MD   Rimegepant Sulfate 75 MG Tablet Dispersible  Take 1 tablet (75 mg) by mouth every other day For migraine prevention 09/12/22   Roseanne Kaufman, FNP   rizatriptan (MAXALT) 10 MG tablet Take 1-2 tablets (10-20 mg total) by mouth once as needed for Migraine Max 3 days a week 06/14/21   Nanda Quinton, MD PhD   tiZANidine (ZANAFLEX) 2 MG tablet Take 1 tablet (2 mg) by mouth every 8 (eight) hours as needed  Patient not taking: Reported on 01/23/2023 12/16/22   [provider]   valACYclovir (VALTREX) 1000 MG tablet Take 2 tabs at onset; repeat once in 12 hours.  Total 4 tabs per outbreak 07/25/22   Loletha Grayer, MD       Review of Systems:   All other systems were reviewed and are negative except: as above    Physical Exam:     Vitals:    01/27/23 1739   BP: 138/85   Pulse: 98   Resp: 12   Temp: 98 F (36.7 C)   SpO2: 94%       Intake and Output Summary (Last 24 hours) at Date Time  No intake or output data in the 24 hours ending 01/27/23 1753    General: awake, alert, oriented x 3; no acute distress.  HEENT: perrla, eomi, sclera anicteric  oropharynx clear without lesions, mucous membranes moist  Neck: supple, no lymphadenopathy, no thyromegaly, no JVD, no carotid bruits  Cardiovascular: regular rate and rhythm, no murmurs, rubs or gallops  Lungs: clear to auscultation bilaterally, without wheezing, rhonchi, or rales  Abdomen: soft, non-tender, non-distended; no palpable masses, no hepatosplenomegaly, normoactive bowel sounds, no rebound or guarding  Extremities: no clubbing, cyanosis, or edema  Neuro: cranial nerves grossly intact, strength 5/5 in upper and lower extremities, sensation intact,   Skin: no rashes or lesions noted      Labs:     Results       Procedure Component Value Units Date/Time    Urinalysis Reflex to Microscopic Exam- Reflex to Culture [id number]  (Abnormal) Collected: 01/27/23 1437     Updated: 01/27/23 1519     Urine Type Urine, Clean Ca     Color, UA  Yellow     Clarity, UA Hazy     Specific Gravity UA 1.034     Urine pH 5.5      Leukocyte Esterase, UA Negative     Nitrite, UA Negative     Protein, UR 20= Trace     Glucose, UA Negative     Ketones UA Trace     Urobilinogen, UA 2.0 mg/dL      Bilirubin, UA Negative     Blood, UA Negative     RBC, UA 0-2 /hpf      WBC, UA 0-5 /hpf      Squamous Epithelial Cells, Urine 26-50 /hpf      Calcium Oxalate Crystals, UA Occasional /hpf      Hyaline Casts, UA 6-10 /lpf      Urine Mucus Present    PT/APTT [id number] Collected: 01/27/23 1352     Updated: 01/27/23 1513     PT 11.3 sec      PT INR 1.0     PTT 32 sec     Basic Metabolic Panel [id number] Collected: 01/27/23 1352    Specimen: Blood Updated: 01/27/23 1443     Glucose 99 mg/dL      BUN 12.0 mg/dL      Creatinine 0.8 mg/dL      Calcium 10.4 mg/dL      Sodium 137 mEq/L      Potassium 4.0 mEq/L      Chloride 102 mEq/L      CO2 25 mEq/L      Anion Gap 10.0     eGFR >60.0 mL/min/1.73 m2     Urine HCG, POC/ Qualitative [id number] Collected: 01/27/23 1436    Specimen: Urine Updated: 01/27/23 1440     POCT QC Pass     POCT Pregnancy HCG Test, UR Negative     Comment: Negative Value is Normal in Healthy Males or Healthy non-pregnant Females    CBC and differential [id number]  (Abnormal) Collected: 01/27/23 1352    Specimen: Blood Updated: 01/27/23 1434     WBC 12.51 x10 3/uL      Hgb 15.0 g/dL      Hematocrit 44.8 %      Platelets 458 x10 3/uL      RBC 4.78 x10 6/uL      MCV 93.7 fL      MCH 31.4 pg      MCHC 33.5 g/dL      RDW 13 %      MPV 8.4 fL      Instrument Absolute Neutrophil Count 6.71 x10 3/uL      Neutrophils 53.6 %      Lymphocytes Automated 38.1 %      Monocytes 6.2 %      Eosinophils Automated 1.0 %      Basophils Automated 0.5 %      Immature Granulocytes 0.6 %      Nucleated RBC 0.0 /100 WBC      Neutrophils Absolute 6.71 x10 3/uL      Lymphocytes Absolute Automated 4.77 x10 3/uL      Monocytes Absolute Automated 0.78 x10 3/uL      Eosinophils Absolute Automated 0.12 x10 3/uL      Basophils Absolute Automated 0.06 x10 3/uL       Immature Granulocytes Absolute 0.07 x10 3/uL      Absolute NRBC 0.00 x10 3/uL             Radiology Results (24 Hour)  Procedure Component Value Units Date/Time    CT Head without Contrast [id number] Collected: 01/27/23 1414    Order Status: Completed Updated: 01/27/23 1431    Narrative:      HISTORY: Episode of aphasia 2 nights ago.  Unusual headache with a history  of migraines.     COMPARISON: MRI of the brain dated April 29, 2021 is available.    TECHNIQUE: CT of the head performed without intravenous contrast. The  following dose reduction techniques were utilized: automated exposure  control and/or adjustment of the mA and/or KV according to patient size,  and the use of an iterative reconstruction technique.    FINDINGS:  No intracranial mass, hemorrhage, or hydrocephalus is detected.   The basal cisterns are clear.    The calvarium and the skull base are intact. No calvarial fracture  or  skull base fracture is seen.  The soft tissues are unremarkable.       The mastoid air cells and the middle ear cavity are aerated bilaterally.     The paranasal sinuses are clear.  No fluid is seen in the paranasal  sinuses.     The orbits are unremarkable.       Impression:         1.No intracranial abnormality is seen.  2. Given the history and symptoms, follow-up MRI may be of benefit.    Melina Copa, MD  01/27/2023 2:29 PM              Assessment:     Patient Active Problem List   Diagnosis    Morbid obesity    Herpes labialis    Non-seasonal allergic rhinitis, unspecified trigger    Migraine with aura and without status migrainosus, not intractable    BMI 45.0-49.9, adult    Post-cholecystectomy syndrome    History of Wolff-Parkinson-White (WPW) syndrome    Scoliosis deformity of spine    Behcet's disease    Immunocompromised state due to drug therapy    S/P hysterectomy    Premature ventricular contractions (PVCs) (VPCs)    Palpitations    NICM (nonischemic cardiomyopathy)    PVC's (premature ventricular  contractions)    Left hip pain    Abdominal pain, unspecified abdominal location    Elevated blood pressure reading in office without diagnosis of hypertension    Vertebrogenic low back pain    Seronegative rheumatoid arthritis    Anterior uveitis    Generalized abdominal pain    Diarrhea, unspecified type    Nausea    Speech abnormality         Plan:   Episode of aphasia, ischemic event like a TIA?  Will order an MRI of the brain with and without contrast as well as an MRI of the orbits with and without contrast as it was already planned for the ongoing blurry vision  Neurology consultation  Lipids were checked recently, will order a statin  Will check A1c  Drug screen  B12 level  CTA of the head and neck  Neurochecks  TSH  Will confirm her home medications  Orthostatic vitals every shift    Possibly volume depletion based on lab work  Will order some IV fluids         Patient has BMI=Body mass index is 50.03 kg/m.  Diagnosis: Obesity Class 3 (formerly known as Morbid Obesity) based on BMI criteria           Status/Rationale:      Signed by: Josem Kaufmann,  MD, MD   [id number], Darrick Meigs, MD

## 2023-01-27 NOTE — ED Notes (Signed)
Bed: 8 C  Expected date:   Expected time:   Means of arrival:   Comments:  F here

## 2023-01-27 NOTE — Progress Notes (Signed)
Patient AOX4, follows commands and responds appropriately. PERRLA. MAE. 5/5 strength in all extremities. Endorses blurry vision + photophobia; + dizziness. Denies any other symptoms. NIHSS 0. Denies numbness/tingling. Safety maintained. Call bell within reach.    Orthostatic BP completed. Patient asymptomatic.  Lying: 142/87 HR 96  Sitting: 149/82 HR 95  Standing: 146/95 HR 96    Plan:  MRI Brain/oRBITS pending  PT/OT  NIHSS Qshift  Neurochecks Q4  Ortho BP Qshift

## 2023-01-27 NOTE — ED to IP RN Note (Signed)
[place] EMERGENCY DEPT  ED NURSING NOTE FOR THE RECEIVING INPATIENT NURSE   ED NURSE Cam/Haizel Gatchell/Trent   SPECTRALINK [id number]   ED CHARGE RN [phone]   ADMISSION INFORMATION   Hailey [name] is a 42 y.o. female admitted with an ED diagnosis of:    No diagnosis found.     Isolation: None   Allergies: Fruit blend flavor [flavoring agent], Cyanoacrylate, Latex, Adhesive [wound dressing adhesive], Other, and Oxycodone   Holding Orders confirmed? N/A   Belongings Documented? N/A   Home medications sent to pharmacy confirmed? N/A   NURSING CARE   Patient Comes From:   Mental Status: Home Independent  alert and oriented   ADL: Independent with all ADLs   Ambulation: mild difficulty   Pertinent Information  and Safety Concerns:     Broset Violence Risk Level: Low Pt had episode of aphasia and loss of memory, lasting a couple of min, 2 nights ago. Per family, mouth was moving but no sound produced. C/o headache and blurry vision x3 wks.        CT / NIH   CT Head ordered on this patient?  N/A   NIH/Dysphagia assessment done prior to admission? N/A   VITAL SIGNS (at the time of this note)      Vitals:    01/27/23 1700   BP: 141/83   Pulse: 92   Resp:    Temp:    SpO2: 93%     Pain Score: 0-No pain (01/27/23 1420)

## 2023-01-27 NOTE — Progress Notes (Signed)
01/27/23 1829   CM Review   Acknowledgment of Outpatient/Observation Observation letter given     Ophelia Charter., CMS  Case Management Specialist  Emergency Department  [facility] Case Management  [phone]

## 2023-01-28 ENCOUNTER — Observation Stay: Payer: BLUE CROSS/BLUE SHIELD

## 2023-01-28 DIAGNOSIS — M352 Behcet's disease: Secondary | ICD-10-CM

## 2023-01-28 DIAGNOSIS — R479 Unspecified speech disturbances: Secondary | ICD-10-CM

## 2023-01-28 DIAGNOSIS — G43109 Migraine with aura, not intractable, without status migrainosus: Secondary | ICD-10-CM

## 2023-01-28 DIAGNOSIS — H209 Unspecified iridocyclitis: Secondary | ICD-10-CM

## 2023-01-28 LAB — HEMOGLOBIN A1C
Average Estimated Glucose: 108.3 mg/dL
Hemoglobin A1C: 5.4 % (ref 4.6–5.6)

## 2023-01-28 MED ORDER — VITAMIN B-12 500 MCG PO TABS
1000.0000 ug | ORAL_TABLET | Freq: Every day | ORAL | Status: DC
Start: 2023-01-28 — End: 2023-01-28
  Administered 2023-01-28: 1000 ug via ORAL
  Filled 2023-01-28 (×2): qty 2

## 2023-01-28 MED ORDER — CYANOCOBALAMIN 1000 MCG PO TABS
1000.0000 ug | ORAL_TABLET | Freq: Every day | ORAL | 0 refills | Status: DC
Start: 2023-01-29 — End: 2024-09-09

## 2023-01-28 MED ORDER — GADOTERATE MEGLUMINE 10 MMOL/20ML IV SOLN (CLARISCAN)
20.0000 mL | Freq: Once | INTRAVENOUS | Status: AC | PRN
Start: 2023-01-28 — End: 2023-01-28
  Administered 2023-01-28: 20 mL via INTRAVENOUS

## 2023-01-28 NOTE — Plan of Care (Signed)
Problem: Pain interferes with ability to perform ADL  Goal: Pain at adequate level as identified by patient  Outcome: Progressing  Flowsheets (Taken 01/28/2023 0559 by Charolotte Capuchin, RN)  Pain at adequate level as identified by patient:   Identify patient comfort function goal   Assess for risk of opioid induced respiratory depression, including snoring/sleep apnea. Alert healthcare team of risk factors identified.   Assess pain on admission, during daily assessment and/or before any "as needed" intervention(s)   Reassess pain within 30-60 minutes of any procedure/intervention, per Pain Assessment, Intervention, Reassessment (AIR) Cycle   Evaluate if patient comfort function goal is met   Offer non-pharmacological pain management interventions   Consult/collaborate with Physical Therapy, Occupational Therapy, and/or Speech Therapy   Include patient/patient care companion in decisions related to pain management as needed   Evaluate patient's satisfaction with pain management progress   Consult/collaborate with Pain Service     Problem: Neurological Deficit  Goal: Neurological status is stable or improving  Outcome: Progressing  Flowsheets (Taken 01/28/2023 0816)  Neurological status is stable or improving:   Monitor/assess/document neurological assessment (Stroke: every 4 hours)   Monitor/assess NIH Stroke Scale   Perform CAM Assessment   A&OX4, FC, MAE, independent. RA, VSS. C/o HA, only icepack needed right now.

## 2023-01-28 NOTE — UM Notes (Signed)
PATIENT NAME: Hailey [name], Hailey [name]   DOB: 1981-08-12     42 y.o. female who presents to the hospital with 1 episode of aphasia on Wednesday.  It lasted for about 2 to 3 minutes.  It was witnessed and her partner states that she was not able to speak at all.  Since then she did not have any symptoms other than blurry vision which seems to be ongoing since a long time.  She does not recall any trigger factors or any factors that made the symptoms worse or better.  She did not have any focal deficits.  She is at her baseline currently.  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  This was in 2005.  Later on she had another ablation for PVCs in 2023.     PLAN- ADMIT TO OBSERVATION CLASS 01/27/2023  1704    Episode of aphasia, ischemic event like a TIA  Will order an MRI of the brain with and without contrast as well as an MRI of the orbits with and without contrast as it was already planned for the ongoing blurry vision  Neurology consultation  Lipids were checked recently, will order a statin  Will check A1c  Drug screen  B12 level  CTA of the head and neck  Neurochecks  TSH  Will confirm her home medications  Orthostatic vitals every shift     Possibly volume depletion based on lab work  Will order some IV fluids     Admit to Expedited Observation (Order [id number]) on 01/27/23     Transient Ischemic Attack (TIA): Observation Care Observation Care Admission Criteria     UTILIZATION REVIEW CONTACT: Name: [place]  Address:  [address] [place], [place]  [address]  NPI:   [id number]  Tax ID:  [id number]  Phone: [phone] voice mail only  Fax: [phone]    Please use fax number [phone] to provide authorization for hospital services or to request additional information.        NOTES TO REVIEWER:     This clinical review is based on/compiled  from documentation provided by the treatment team within the patient's medical record.

## 2023-01-28 NOTE — OT Eval Note (Signed)
Occupational Therapy [place]        Post Acute Care Therapy Recommendations:     Discharge Recommendations:  Home with supervision    DME needs IF patient is discharging home: Patient already has needed equipment    Therapy discharge recommendations may change with patient status.  Please refer to most recent note for up-to-date recommendations.    Assessment:   Significant Findings: none    Hailey [name] is a 42 y.o. female admitted 01/27/2023 with aphasia presents with no acute OT needs at this time as pt is at sup/I level with self care and transfers. Pt educated in safety measures with functional mobility and self care. D/C OT.    Therapy Diagnosis: decreased ADLs    Rehabilitation Potential: good    Treatment Activities: OT eval, self care  Educated the patient to role of occupational therapy, plan of care, goals of therapy and safety with mobility and ADLs, home safety.    Plan:   OT Frequency Recommended: one time visit - therapy discontinued     Treatment/Interventions: OT eval, ADL training    Risks/benefits/POC discussed with pt.    Unit: [facility] EMERGENCY DEPT  Bed: 8 C/8 C        Precautions and Contraindications:   Falls    Consult received for Hailey [name] for OT Evaluation and Treatment.  Patient's medical condition is appropriate for Occupational Therapy intervention at this time.      History of Present Illness:    Hailey [name] is a 42 y.o. female admitted on 01/27/2023 with CC: Episode of aphasia     Pt with 1 episode of aphasia on Wednesday.  It lasted for about 2 to 3 minutes.  It was witnessed and her partner states that she was not able to speak at all.  Since then she did not have any symptoms other than blurry vision which seems to be ongoing since a long time.  She does not recall any trigger factors or any factors that made the symptoms worse or better.  She did not have any focal deficits.  She is at her baseline currently.  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  This was in 2005.  Later on she had another ablation for PVCs in 2023. Per chart.    Admitting Diagnosis: Speech abnormality [R47.9]    Past Medical/Surgical History:  Past Medical History:   Diagnosis Date    Anxiety     Arrhythmia     WPW ablation in 2005, PVC ablation 8/23    Behcet's disease     She sees Dr. Lauro Regulus    BMI 45.0-49.9, adult     Fever 11/09/2022    Headache     migraine    Hx of ovarian cyst     Lower back pain     Meningitis 2011    Viral // treated and resolved    Pain in wrist 11/09/2022    Post-operative nausea and vomiting     Scoliosis     moderate// used a Brace at a young age    Uveitis      Past Surgical History:   Procedure Laterality Date    ABLATION - PVC'S N/A 06/08/2022    Procedure: Ablation - PVC's;  Surgeon: Jaynie Bream, MD;  Location: [facility];  Service: Cardiovascular;  Laterality: N/A;  SAME  DAY DISCHARGE  CARTO NO TEE    ARTHROTOMY, WRIST Right 12/03/2020    Procedure: RIGHT UPPER EXTREMITY SYNOVIAL BIOPSY;  Surgeon: Lenox Ahr, MD;  Location: [place] [facility];  Service: Plastics;  Laterality: Right;    BIOPSY, LYMPH NODE N/A 07/23/2018    Procedure: BIOPSY, LYMPH NODE;  Surgeon: Neldon Labella, MD;  Location: [place] [facility];  Service: ENT;  Laterality: N/A;  NECK DEEP LYMPH NODE BIOPSY    BONE MARROW BIOPSY  2020    led to behcet's dx    CARDIAC ABLATION  2005    [place] [facility] [place] for Wolff-Parkinson-[facility]    CHOLECYSTECTOMY  2009    D&C DIAGNOSTIC  2010    DILATION AND CURETTAGE OF UTERUS  2010    FINGER GANGLION CYST EXCISION  04/2018    GANGLION CYST EXCISION  2014    04/2018 cyst removed from index finger-Mosinee Nitro  2016    HYSTERECTOMY  2012    HYSTEROSCOPY  2011    indication: Abnormal bleeding    LAPAROSCOPIC, LYSIS, ADHESIONS N/A 11/09/2022    Procedure: LAPAROSCOPIC LYSIS OF ADHESIONS;   Surgeon: Lindell Noe, MD;  Location: [facility] OR;  Service: Gynecology;  Laterality: N/A;    LAPAROSCOPIC, SALPINGECTOMY Left 11/09/2022    Procedure: LAPAROSCOPIC, SALPINGECTOMY LEFT;  Surgeon: Lindell Noe, MD;  Location: [facility] OR;  Service: Gynecology;  Laterality: Left;    LAPAROSCOPY, DIAGNOSTIC N/A 11/09/2022    Procedure: LAPAROSCOPY, DIAGNOSTIC;  Surgeon: Lindell Noe, MD;  Location: [place] [facility];  Service: Gynecology;  Laterality: N/A;  0000000    UMBILICAL HERNIA REPAIR  2016    UPPER GASTROINTESTINAL ENDOSCOPY  2009    WISDOM TOOTH EXTRACTION  1998         Imaging/Tests/Labs:  MRI Brain W WO Contrast  Result Date: 01/28/2023  No acute intracranial or orbital abnormality. Milan Consepcion Hearing, MD 01/28/2023 7:57 AM    MRI Orbits Only W WO Contrast  Result Date: 01/28/2023  No acute intracranial or orbital abnormality. Milan Consepcion Hearing, MD 01/28/2023 7:57 AM    CT Angiogram Head Neck  Result Date: 01/27/2023  1.There is no stenosis of the proximal right internal carotid artery based on NASCET criteria.  2.There is no stenosis of the proximal left internal carotid artery based on NASCET criteria. 3.The vertebrobasilar arterial system is patent. 4.Normal intracranial CT angiogram. There is no proximal intracranial large vessel occlusion. Milan Consepcion Hearing, MD 01/27/2023 6:31 PM    CT Head without Contrast  Result Date: 01/27/2023   1.No intracranial abnormality is seen. 2. Given the history and symptoms, follow-up MRI may be of benefit. Melina Copa, MD 01/27/2023 2:29 PM     Lab Results   Component Value Date/Time    HGB 15.0 (H) 01/27/2023 01:52 PM          HCT 44.8 (H) 01/27/2023 01:52 PM    K 4.0 01/27/2023 01:52 PM    NA 137 01/27/2023 01:52 PM    INR 1.0 01/27/2023 01:52 PM     Social History:   Prior Level of Function:   Prior level of function: Independent with ADLs, Ambulates with assistive device  Assistive Device: Single point cane (at times for stairs)  Baseline  Activity Level: Community ambulation  Ambulated 100 feet or more prior to admission: Yes  Driving:  (has not driven in 3 weeks (since vision issues developed))  DME Currently at Home: ADL- Shower  [place], [place], [place], [place], [employer]    Home Living Arrangements:  Living Arrangements: Spouse/significant other  Type of Home: House  Home Layout: Two level, 1/2 [place] on main level, Bed/bath upstairs (4-6 STE)  Bathroom Shower/Tub: Tourist information centre manager: Standard  DME Currently at Home: ADL- Civil engineer, contracting, [place], [place], [place], [employer]    Subjective: "Since I had my MRI I think they will move my ophthalmology appointment up"    Patient is agreeable to participation in the therapy session. Nursing clears patient for therapy.     Patient Goal: find why my vision is affected  Pain: denies    Objective:   Patient is in bed with IV in place.  Pt wore mask during therapy session:No      BP  Supine 151/88  Sitting 159/91  Standing 164/98  Mild dizziness with changes in position but no LOB    Cognitive Status and Neuro Exam:  Alert, oriented x 3, follows commands  RAM: intact  FMC: intact (limited by F-N on R side 2/2 IV)  No pronator drift    Musculoskeletal Examination  RUE ROM: WFL  LUE ROM: WFL    RUE Strength: WFL  LUE Strength: WFL  Full opposition    Sensory/Oculomotor Examination  Auditory: WFL  Tactile: LT intact UEs; no c/o numbness UE/LE  Vision: tracking WFL with mild increased HA; no neglect noted; peripheral vision WFL  Pt c/o blurry vision L>R  Pt reports changes in vision started 3 weeks ago and is under the care of an ophthalmologist     Activities of Daily Living  Eating: I  Grooming: I  Bathing: NT; educated in safety and using DME in place with sup   UE Dressing: I  LE Dressing: mod I  Toileting: declined but stated no difficulty with transfers    Functional Mobility:  Supine to Sit: I  Sit to Stand: I  Transfers: sup  Sit-supine: I    PMP Activity: Step 7 - Walks out of Room       Balance  Static Sitting: good  Dynamic Sitting: good  Static Standing: good  Dynamic Standing: sup  Head turns while ambulating: increased dizziness with head turn to her L requiring SBA and mild dizziness with looking up  Mild dizziness with changes in position but no LOB    Participation and Activity Tolerance  Participation Effort: good  Endurance: good    Patient left with call bell within reach, all needs met, SCDs off as found, fall mat NA as found, bed alarm NA as found, spouse in room and all questions answered. RN notified of session outcome and patient response.     PPE worn during session: procedural mask and gloves    Tech present: no  PPE worn by tech: NA    Time of Treatment:   OT Received On: 01/28/23  Start Time: 0810  Stop Time: 0840  Time Calculation (min): 30 min    Signature: Salem Senate, OTR/L, [place]  Pager# [phone]

## 2023-01-28 NOTE — Discharge Instructions (Addendum)
Reason for your Hospital Admission:  Transient inability to speak - possible seizure  Low Vitamin B12 level  High cholesterol (from blood work done on 01/23/23 by your primary care provider)    Instructions for after your discharge:  -- Schedule a follow up visit with your primary care provider - within 1 week - for an after hospital visit  -- Follow up with your neurologist for an EEG.  -- We are concerned your may have had a seizure, therefore your are NOT cleared to drive. We have listed additional restrictions for you below that you should follow until instructed to otherwise by your neurologist.     -- START taking a daily Vitamin B12 supplement. We checked your level and it was TOO LOW. You will need your B12 level re-checked after being on the supplement  -- Your cholesterol level on 3/18 was TOO HIGH. The first intervention is healthy diet and exercise. Please talk to your primary care provider and have this level rechecked in 3 months  -- Follow a low fat diet  -- Hydrate well, activity as tolerated.  -- You are NOT cleared to drive.    -- Return to the nearest emergency department for chest pain, shortness of breath, fever >100.5 degrees, dizziness, passing out, profuse vomiting or diarrhea, sudden one sided numbness and/or weakness, severe headache, new confusion, trouble speaking, difficulty walking, facial droop, or any other concerning symptoms      SEIZURE PRECAUTIONS:   Practice good seizure safety:  No list can be comprehensive but most importantly, use your common sense to avoid dangerous situations.  -Avoid situations and activities where you could harm yourself or others if you lost consciousness or control of your body.  -Avoid being alone, since unwitnessed seizures can cause delay in getting help and can be life threatening if you are in unsafe surroundings.  -Make sure you are not alone when doing things that have the potential to become dangerous if you lost control of your body, like cooking  over a stove, preparing food, etc.  -Use strategies for keeping in touch with people so they know if something is wrong.  -Shower, do not bathe, and make sure your drains work properly.  -Do not swim. If you choose to swim this can put you at risk for drowning.  -Avoid situations where falling from a height could cause injury (i.e. Roofs, ladders)  -Do not drive or at least 6 months following this event or unless you are cleared by a physician, per the [place] Department of [employer]. [employer]     If you have another seizure, there are proper safety precautions to avoid injury as best possible: stay calm, have your friends or family time the episode, turn you on your side, loosen tight clothing, and do not place anything in the your mouth or restrain you.     More information on seizure safety and support groups can be found online. Here are some websites that may be useful:  -https://www.epilepsy.com/learn/seizure-first-aid-and-safety/staying-safe/safety-home  -https://www.epilepsy.com/learn/early-death-and-sudep  -https://www.epilepsy.com/learn/early-death-and-sudep/sudep  -[employer]

## 2023-01-28 NOTE — Consults (Signed)
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 female with Behcet's disease and migraines who presented to [facility] on 01/27/23 with episode of speech disturbance on 01/25/23 lasting 2-3 minutes. Initial imaging showed no acute findings. Brain MRI shows no acute infarct.    Etiology of neurological symptoms:  Unclear; possible focal seizure, given description (speech arrest with loss of awareness) vs sequelae of current Behcet's flare; low suspicion for TIA given atypical symptoms not in a clear vascular territory    Stroke Team Recommendations:    - Stop ASA 81mg  every day   - Blood Pressure Goal: normotension ok  - Follow up with outpatient neurology (Dr. Loyola Mast and Dr. Waunita Schooner)  - Recommend outpatient routine EEG  - No further stroke workup indicated  - STAT Non-contrast head CT for any acute neurological changes  - Physical therapy/Occupational Therapy/Speech Language Pathology, Nutrition consults  - DVT prophylaxis per primary team    Thank you for allowing [place] to participate in the care of [name].  Case was discussed with Dr. Manuella Ghazi who agrees with plan of care above.  Please contact us with any questions at any time.    Please call [phone] for any questions/concerns or exam changes   (stroke team 24 hr in-house coverage)        Stroke imaging and labs with stroke team interpretation:  - NCHCT: No acute intracranial pathology noted, no acute hemorrhage.  - CTA H&N: No large vessel occlusion noted. No evidence of intracranial vessel wall irregularity or stenosis. No carotid or vertebral artery stenosis or dissection in the neck.   - EKG:  Pending  - MRI Brain and orbits w/wo: No acute intracranial or orbital abnormality. No abnormal enhancement.    - Stroke Labs:   Lab Results   Component Value Date    CHOL 226 (H) 01/23/2023    HDL 49 01/23/2023    LDL 131 (H) 01/23/2023    TRIG 228 (H) 01/23/2023    HGBA1C 5.4 01/28/2023     Subjective   History of Present Illness:    Hailey [name] is a 42 y.o. female, with PMHx of Behcet's, migraines, and prior viral meningitis (2011), who presented to [facility] on 01/27/23 with episode of speech disturbance on 01/25/23 lasting 2-3 minutes and ongoing blurry vision. LKNT 01/25/23. Patient states that earlier in the day on 01/25/23 she had room spinning dizziness that was more severe than her typical vertigo. She took hydroxyzine and that lasted a few hours and resolved. She has also had a headache related to known uveitis. She has history of migraines and takes Maxalt. Later in the evening about an hour after dinner she was watching a movie and suddenly stopped talking and was staring straight ahead and her husband could not get her attention. He noticed that she was picking at her mouth and not responding. Patient does not recall the event. She recalls coming back to and then talking to her husband. No prior similar symptoms. She had a severe headache following this episode of speech arrest but it was  different from her typical migraine. She took an aspirin and went to bed. She notified her neurologist on 01/26/23 and she told her to go to the ED. The symptoms have not recurred. No regular antiplatelet or anticoagulant medication. She is independent for ADLs.    She recently had some elevated BP readings which were thought to be due to PO steroids she took for uveitis. Lipids are also elevated compared to her baseline levels. She has close f/u with PCP. Has also seen Dr Waunita Schooner in the past, no evidence of neuro Behcets. 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     -  NIHSS: 0  NIHSS Total: 0 (01/28/2023  7:20 AM)    - Baseline mRS 0    I personally reviewed the following:   Past Medical History  Surgical History  Family History  Social History  Chart Review  Problem List  Allergies  Medications             Objective   Most Recent Vital Signs:  Temp: 98 F (36.7 C) BP: 123/89 Heart Rate: 83 Resp Rate: 16 SpO2: 96 % on O2 Flow Rate (L/min): 0 L/min None (Room air)    GENERAL: female in NAD who appears as stated age. Well nourished and developed. Cooperative with exam.  HEENT: Normocephalic, atraumatic, mucous membranes moist  CV: Warm and well perfused.  No clubbing cyanosis or edema  Pulmonary: No SOB, no resp distress on RA   Psych: Does not appear anxious or agitated    NEUROLOGICAL:  MENTATION: Awake, alert, oriented to person, place, and time. Attention and fund of knowledge intact.  LANGUAGE/SPEECH: Conversant speech. Intact fluency, comprehension, repetition, and naming. No dysarthria appreciated.    CRANIAL NERVES:  II, III, IV, VI:  Pupils equal and reactive to light. Visual fields grossly full. Reduced acuity in bilateral peripheral vision. Extra-ocular motion intact.  V: Facial Sensation is intact to light touch and symmetric.  VII: Facial movement is symmetric.  VIII: Hearing grossly intact bilaterally.  IX: No drooling.  X: Voice full.  XI: Turns head bilaterally.  XII: Tongue protrudes midline.    MOTOR/STRENGTH:  Muscle tone normal without spasticity or flaccidity. No atrophy. Moving all extremities spontaneously against gravity.    SENSORY: Grossly intact to light touch bilaterally.    COORDINATION: FTN and HKS intact bilaterally, no truncal ataxia. No tremors.    GAIT: Deferred due to safety risks    NIHSS:    1a  Level of consciousness: 0=alert; keenly responsive   1b. LOC questions:  0 = Answers both questions correctly   1c. LOC commands: 0=Performs both tasks correctly   2.  Best Gaze: 0=normal   3. Visual: 0=No visual loss   4. Facial Palsy: 0=Normal  symmetric movement   5a. Motor left arm: 0 = No drift; holds 90 degrees for full 10 seconds   5b.  Motor right arm: 0 = No drift; holds 90 degrees for full 10 seconds   6a. Motor left leg: 0=No drift, limb holds 30 degrees for full 5 seconds   6b  Motor right leg:  0=No drift, limb holds 30 degrees for full 5 seconds   7. Limb Ataxia: 0=Absent   8.  Sensory: 0=Normal; no sensory loss   9. Best Language:  0=No aphasia, normal   10. Dysarthria: 0=Normal   11. Extinction and Inattention: 0=No abnormality    Total: 0  I have personally assessed the patient and based my assessment and medical decision-making on a review of the patient's history and 24-hour interval events along with medical records, physical examination, vital signs, analysis of recent laboratory results, evaluation of radiology images, and monitoring data. The findings and plan of care was discussed with the care team.      Total care time: 10 minutes    Chanda Busing, [place]  01/28/2023 12:24 PM   Neurology/Stroke Team APP      Attending Split Share Statement:     As the physician signing this note, I have reviewed the history and exam and pertinent imaging results. My edits revise, addend or confirm the recommendations or plans in this note. I discussed with the plan of care with the APP/Resident and I agree with the findings noted unless revised or updated in my notations. I formulated all of the medical decision making noted in the assessment & plan.    Jillane Po A. Manuella Ghazi, M.D.  Vascular Neurology Attending  Lenn Sink  MD time 35 mins

## 2023-01-28 NOTE — Plan of Care (Signed)
Patient alert and oriented X4. MRI done  Problem: Pain interferes with ability to perform ADL  Goal: Pain at adequate level as identified by patient  Outcome: Progressing  Flowsheets (Taken 01/28/2023 0559)  Pain at adequate level as identified by patient:   Identify patient comfort function goal   Assess for risk of opioid induced respiratory depression, including snoring/sleep apnea. Alert healthcare team of risk factors identified.   Assess pain on admission, during daily assessment and/or before any "as needed" intervention(s)   Reassess pain within 30-60 minutes of any procedure/intervention, per Pain Assessment, Intervention, Reassessment (AIR) Cycle   Evaluate if patient comfort function goal is met   Offer non-pharmacological pain management interventions   Consult/collaborate with Physical Therapy, Occupational Therapy, and/or Speech Therapy   Include patient/patient care companion in decisions related to pain management as needed   Evaluate patient's satisfaction with pain management progress   Consult/collaborate with Pain Service     Problem: Side Effects from Pain Analgesia  Goal: Patient will experience minimal side effects of analgesic therapy  Outcome: Progressing  Flowsheets (Taken 01/28/2023 0559)  Patient will experience minimal side effects of analgesic therapy:   Monitor/assess patient's respiratory status (RR depth, effort, breath sounds)   Prevent/manage side effects per LIP orders (i.e. nausea, vomiting, pruritus, constipation, urinary retention, etc.)   Evaluate for opioid-induced sedation with appropriate assessment tool (i.e. POSS)   Assess for changes in cognitive function

## 2023-01-28 NOTE — Discharge Summary (Addendum)
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  #Hx WPW s/p ablation (2005)  #Vit B12 deficiency (297) - new dx  #HLD - new dx  #s/p hysterectomy (2012)            Disposition:  home     Discharge MEDICATIONS        Medication List        START taking these medications      cyanocobalamin 1000 MCG tablet  Take 1 tablet (1,000 mcg) by mouth daily  Start taking on: January 29, 2023            CONTINUE taking these medications      Azelastine-Fluticasone 137-50 MCG/ACT Susp     Botox 200 units injection  Generic drug: botulinum toxin type A  Inject 155 units IM every 12 weeks     busPIRone 5 MG tablet  Commonly known as: BUSPAR  Take 1 tablet (5 mg) by mouth daily     clindamycin 1 % lotion  Commonly known as: CLEOCIN T     colesevelam 625 MG tablet  Commonly known as: WELCHOL  Take 2 tablets (1,250 mg) by mouth every morning     Dapsone 7.5 % Gel     dexAMETHasone 0.1 % ophthalmic solution  Commonly known as: DECADRON     dicyclomine 10 MG capsule  Commonly known as: BENTYL  Take 1 capsule (10 mg) by mouth 4 times daily - with meals and at bedtime for 20 doses     EPINEPHrine 0.3 MG/0.3ML Soaj injection  Inject 0.3 mLs (0.3 mg) into the muscle once as needed (Anaphylaxis)     hydrOXYzine 10 MG tablet  Commonly known as: ATARAX  Take 1 tablet (10 mg) by mouth nightly     inFLIXimab 100 MG injection     Jublia 10 % Soln  Generic drug: Efinaconazole     lidocaine-prilocaine cream  Commonly known as: EMLA     ondansetron 4 MG disintegrating tablet  Commonly known as:  ZOFRAN-ODT  Take 1 tablet (4 mg) by mouth every 8 (eight) hours as needed for Nausea     Otezla 30 MG Tabs  Generic drug: Apremilast  Take 1 tablet (30 mg) by mouth 2 (two) times daily     Rimegepant Sulfate 75 MG Tbdp  Take 1 tablet (75 mg) by mouth every other day For migraine prevention     rizatriptan 10 MG tablet  Commonly known as: MAXALT  Take 1-2 tablets (10-20 mg total) by mouth once as needed for Migraine Max 3 days a week     valACYclovir 1000 MG tablet  Commonly known as: VALTREX  Take 2 tabs at onset; repeat once in 12 hours.  Total 4 tabs per outbreak               Where to Get Your Medications        These medications were sent to [id number] - [place], [place], [address]  [place], [place], [place] [address]      Phone: [phone]   cyanocobalamin 1000 MCG tablet       Consultations:   Treatment Team:   Particia Jasper, MD  Lizabeth Leyden, MD  Claudie Leach, MD  Don Perking, MD  Charolotte Capuchin, RN  Mont Dutton, OT  Silvestre Mesi, RN   Recent Labs:   Recent Labs   Lab 01/27/23  1352   WBC 12.51*   Hgb 15.0*   Hematocrit 44.8*   Platelets 458*     Recent Labs   Lab 01/27/23  1352   Sodium 137   Potassium 4.0   Chloride 102   CO2 25   BUN 12.0   Creatinine 0.8   eGFR >60.0   Glucose 99   Calcium 10.4         Recent Labs   Lab 01/23/23  1238   TSH 1.36      No results found for: "T4".       Recent Labs   Lab 01/23/23  1238   Cholesterol 226*   Triglycerides 228*   HDL 49   LDL Calculated 131*             Microbiology Results (last 15 days)       ** No results found for the last 360 hours. **          Procedures/Radiology performed:   Radiology: all results from this admission  MRI Brain W WO Contrast    Result Date: 01/28/2023  No acute intracranial or orbital abnormality. Milan Consepcion Hearing, MD 01/28/2023 7:57 AM    MRI Orbits Only W WO Contrast    Result Date: 01/28/2023  No acute intracranial or orbital abnormality. Milan  Consepcion Hearing, MD 01/28/2023 7:57 AM    CT Angiogram Head Neck    Result Date: 01/27/2023  1.There is no stenosis of the proximal right internal carotid artery based on NASCET criteria.  2.There is no stenosis of the proximal left internal carotid artery based on NASCET criteria. 3.The vertebrobasilar arterial system is patent. 4.Normal intracranial CT angiogram. There is no proximal intracranial large vessel occlusion. Milan Consepcion Hearing, MD 01/27/2023 6:31 PM    CT Head without Contrast    Result Date: 01/27/2023   1.No intracranial abnormality is seen. 2. Given the history and symptoms, follow-up MRI may be of benefit. Melina Copa, MD 01/27/2023 2:29 PM   ECHOCARDIOGRAM   Echo Results       None          Hospital Course:   Reason for admission/ HPI: presents to the hospital with 1 episode of aphasia on Wednesday.  It lasted for about 2 to 3 minutes.  It was witnessed and her partner states that she was not able to speak at all.  Since then she did not have any symptoms other than blurry vision which seems to be ongoing since a long time.  She does not recall any trigger factors or any factors that made the symptoms worse or better.  She did not have any focal deficits.  She is at her baseline currently.  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  This was in 2005.  Later on she had another ablation for PVCs in 2023.     Hospital Course: This is a 42 y.o. female w/  PMH Behcet's, WPW s/p ablation (2005, 2023) admitted 01/27/2023  for episode of aphasia on Wednesday lasting 2-3 minutes. She was watching TV with her husband when she paused mid sentence and had a blank stare for a few moments. Patient reports a brief laps in memory during that time then quickly came back to and was able to reorient herself in conversation. Sent a message to her neurologist who encouraged her to come in for evaluation.     Arrived to ED  tachycardic HR 107. Labs notable for WBC 12.5, H/H 15/44.8, PLT 458, ESR 25, CRP  <0.1, vitamin B12 297 UA with trace protein and ketones, 2 urobilinogen, squamous epithelial cells 26-50, hyaline cast 6-10. Beta hcg negative. CTH without acute intracranial abnormality. CTA H/N without evidence of stenosis or large vessel occlusion, patent dural venous sinuses and cortical veins with no significant stenosis or thrombosis. Aspirin/statin therapy initiated.  Admitted to observation for further evaluation. MRI brain/orbits without acute intracranial or orbital abnormality. Risk stratifying labs with A1c 5.4, lipid panel from 3/18 reviewed (LDL 131, TC 226, triglycerides 228). OT recommends home with supervision, PT deferred evaluation.   Discussed with neurology, recommend OP f/u with neurology for EEG, no need for statin or asa at this time. Should follow seizure precautions, is not cleared to drive. Recommend lifestyle interventions as first line of intervention for elevated lipid panel. Will start vitamin B12 supplement daily x 1 month and recheck level with PCP.daily.Should f/u with PCP in 3-5 days.     Currently the patient is hemodynamically stable for discharge with outpatient follow up as outlined below.    Discharge Day Exam:  DISCHARGE DAY EXAM:  BP 144/88   Pulse 94   Temp 98.3 F (36.8 C) (Oral)   Resp 17   Wt 142.9 kg (315 lb)   LMP  (LMP Unknown)   SpO2 95%   BMI 50.03 kg/m   Body mass index is 50.03 kg/m.    Physical Exam  Vitals and nursing note reviewed.   Constitutional:       General: She is awake. She is not in acute distress.     Appearance: Normal appearance. She is morbidly obese. She is not ill-appearing or toxic-appearing.   HENT:      Head: Normocephalic and atraumatic.      Mouth/Throat:      Mouth: Mucous membranes are moist.      Pharynx: Oropharynx is clear.   Eyes:      Extraocular Movements: Extraocular movements intact.      Right eye: No nystagmus.      Left eye: No nystagmus.       Conjunctiva/sclera: Conjunctivae normal.      Pupils: Pupils are equal, round, and reactive to light.   Cardiovascular:      Rate and Rhythm: Normal rate and regular rhythm.      Pulses: Normal pulses.      Heart sounds: Normal heart sounds. No murmur heard.     No friction rub. No gallop.   Pulmonary:      Effort: Pulmonary effort is normal. No respiratory distress.      Breath sounds: Normal breath sounds. No stridor. No wheezing.   Skin:     General: Skin is warm and dry.      Capillary Refill: Capillary refill takes less than 2 seconds.   Neurological:      General: No focal deficit present.      Mental Status: She is alert and oriented  to person, place, and time. Mental status is at baseline.      GCS: GCS eye subscore is 4. GCS verbal subscore is 5. GCS motor subscore is 6.      Cranial Nerves: No dysarthria or facial asymmetry.      Sensory: Sensation is intact.      Motor: Motor function is intact. No weakness, tremor, abnormal muscle tone or pronator drift.      Coordination: Coordination is intact.   Psychiatric:         Mood and Affect: Mood normal.         Behavior: Behavior normal. Behavior is cooperative.          Discharge Condition & Coordination:     Condition: Stable    Coordination  Updated patient with discharge plan, all questions answered  Updated family on discharge plan     Emergency Contact  Extended Emergency Contact Information  Primary Emergency Contact: [facility]  Address: [place], [place] [address] [place]  Mobile Phone: [phone]  Relation: Spouse    Pending Results, Recommendations & Instructions to providers after discharge:   Micro / Labs / Path pending:   Unresulted Labs       None          Date of completion for antibiotics or other medications: n/a     Discharge Instructions:   Diet:: Regular Diet   Activity: as tolerated    Instructions for after your discharge:  -- Schedule a follow up visit with your primary care provider - within 1  week - for an after hospital visit  -- Follow up with your neurologist for an EEG.  -- We are concerned your may have had a seizure, therefore your are NOT cleared to drive. We have listed additional restrictions for you below that you should follow until instructed to otherwise by your neurologist.     -- START taking a daily Vitamin B12 supplement. We checked your level and it was TOO LOW. You will need your B12 level re-checked after being on the supplement  -- Your cholesterol level on 3/18 was TOO HIGH. The first intervention is healthy diet and exercise. Please talk to your primary care provider and have this level rechecked in 3 months  -- Follow a low fat diet  -- Hydrate well, activity as tolerated.  -- You are NOT cleared to drive.    -- Return to the nearest emergency department for chest pain, shortness of breath, fever >100.5 degrees, dizziness, passing out, profuse vomiting or diarrhea, sudden one sided numbness and/or weakness, severe headache, new confusion, trouble speaking, difficulty walking, facial droop, or any other concerning symptoms      SEIZURE PRECAUTIONS:   Practice good seizure safety:  No list can be comprehensive but most importantly, use your common sense to avoid dangerous situations.  -Avoid situations and activities where you could harm yourself or others if you lost consciousness or control of your body.  -Avoid being alone, since unwitnessed seizures can cause delay in getting help and can be life threatening if you are in unsafe surroundings.  -Make sure you are not alone when doing things that have the potential to become dangerous if you lost control of your body, like cooking over a stove, preparing food, etc.  -Use strategies for keeping in touch with people so they know if something is wrong.  -Shower, do not bathe, and  make sure your drains work properly.  -Do not swim. If you choose to swim this can put you at risk for drowning.  -Avoid situations where falling from a  height could cause injury (i.e. Roofs, ladders)  -Do not drive or at least 6 months following this event or unless you are cleared by a physician, per the [place] Department of [employer]. [employer]     If you have another seizure, there are proper safety precautions to avoid injury as best possible: stay calm, have your friends or family time the episode, turn you on your side, loosen tight clothing, and do not place anything in the your mouth or restrain you.     More information on seizure safety and support groups can be found online. Here are some websites that may be useful:  -https://www.epilepsy.com/learn/seizure-first-aid-and-safety/staying-safe/safety-home  -https://www.epilepsy.com/learn/early-death-and-sudep  -https://www.epilepsy.com/learn/early-death-and-sudep/sudep  -[employer]    Complete instructions and follow up are in the patient's After Visit Summary    Minutes spent coordinating discharge and reviewing discharge plan:40 minutes     Follow-up Information       Loletha Grayer, MD Follow up.    Specialty: Family Medicine  Why: Follow up in 1 week  Contact information:  [address]  [place] [address]  [phone]               Aida Puffer, MD. Schedule an appointment as soon as possible for a visit in 1 week(s).    Specialty: Neurology  Contact information:  [address]  [address]  [place] [address]  [phone]                                Signed by: Angelia Mould, FNP  Discussed in detail with my attending provider: Particia Jasper, MD    [place] [phone] F: [phone]  [facility]  Department of Medicine  P: [phone]  F: [phone]    CC: Loletha Grayer, MD  Please see attending note that follows this mid-level encounter note.       Attending Attestation:     I have  seen and personally examined the patient.  I agree with the history, exam, assessment, and management plans as documented by NP Ziobro.  I concur with or have edited all elements of the provider's note, with any caveats as follows    CNS Hospitalist Patient    42 yo F admitted 01/27/23 an isolated episode (2-3 minutes) of inability to speak; this occurred 3 days prior to admission. Labs on admission notable for: WBC 12.51, H/H 15.0/44.8, negative urine HCG, UA negative for infection. CT Head showed no acute abnormality. CTA H&N showed a normal exam. Patient was admitted to observation. Patient was admitted to observation. Orthostatics were checked and negative. MRI Brain showed no acute abnormality. MRI Orbits showed no acute abnormality. PT/OT evaluated patient and recommended home with supervision. Case was discussed with general neurology as well as stroke neurology team and no statin/aspirin recommended, recommend outpt f/u with EEG.     # Transient aphasia; r/o seizure, atypical HA; low suspicion for TIA  # Leukocytosis  # Polycythemia   # Uveitis (recently diagnosed) - on steroid eye drops and remicaide PTA 01/23/23  # History of Behcet's (Rheum - McBride); on remicaid (first dose 01/23/23)  # History of [employer] s/p  ablation (2005, [facility])  # Vitamin B12 deficiency (297) - new diagnosis  # HLD (TChol 226, TRIG 228, LDL 131) - new diagnosis (outpt PCP labs)  # Obesity, BMI 50.0  # S/p hysterectomy (2012)    Plan:  - discharge to home today  - outpt f/u with PCP  - outpt f/u with [facility] Neuro (followed by Dr Loyola Mast)  - needs outpatient EEG  - recommended seizure precautions (as neuro is recommending outpt EEG work up)  - start VitB12 (new)  - attempt lifestyle changes first, need lipid panel repeated  Full Code    I have personally performed the substantive portion of this visit by conducting a face to face encounter, ordering and reviewing labs/imaging studies, discussing plan of care with consultants and  other care team members, and performing medical decision making. A total of 34 minutes was spent.       Disposition:     Today's date: 01/28/2023  Admit Date: 01/27/2023  2:01 PM  Clinical Milestones: [place] today  Anticipated discharge needs: outpt /fu     Particia Jasper, MD

## 2023-01-28 NOTE — Plan of Care (Addendum)
Problem: Pain interferes with ability to perform ADL  Goal: Pain at adequate level as identified by patient  01/28/2023 1253 by Silvestre Mesi, RN  Outcome: Completed  01/28/2023 1253 by Silvestre Mesi, RN  Outcome: Adequate for Discharge  01/28/2023 0816 by Silvestre Mesi, RN  Outcome: Progressing  Flowsheets (Taken 01/28/2023 0559 by Charolotte Capuchin, RN)  Pain at adequate level as identified by patient:   Identify patient comfort function goal   Assess for risk of opioid induced respiratory depression, including snoring/sleep apnea. Alert healthcare team of risk factors identified.   Assess pain on admission, during daily assessment and/or before any "as needed" intervention(s)   Reassess pain within 30-60 minutes of any procedure/intervention, per Pain Assessment, Intervention, Reassessment (AIR) Cycle   Evaluate if patient comfort function goal is met   Offer non-pharmacological pain management interventions   Consult/collaborate with Physical Therapy, Occupational Therapy, and/or Speech Therapy   Include patient/patient care companion in decisions related to pain management as needed   Evaluate patient's satisfaction with pain management progress   Consult/collaborate with Pain Service     Problem: Side Effects from Pain Analgesia  Goal: Patient will experience minimal side effects of analgesic therapy  01/28/2023 1253 by Silvestre Mesi, RN  Outcome: Completed  01/28/2023 1253 by Silvestre Mesi, RN  Outcome: Adequate for Discharge     Problem: Neurological Deficit  Goal: Neurological status is stable or improving  01/28/2023 1253 by Silvestre Mesi, RN  Outcome: Completed  01/28/2023 1253 by Silvestre Mesi, RN  Outcome: Adequate for Discharge  01/28/2023 0816 by Silvestre Mesi, RN  Outcome: Progressing  Flowsheets (Taken 01/28/2023 [phone])  Neurological status is stable or improving:   Monitor/assess/document neurological assessment (Stroke: every 4 hours)   Monitor/assess NIH Stroke Scale    Perform CAM Assessment     Problem: Potential for Aspiration  Goal: Risk of aspiration will be minimized  01/28/2023 1253 by Silvestre Mesi, RN  Outcome: Completed  01/28/2023 1253 by Silvestre Mesi, RN  Outcome: Adequate for Discharge     Problem: Peripheral Neurovascular Impairment  Goal: Extremity color, movement, sensation are maintained or improved  01/28/2023 1253 by Silvestre Mesi, RN  Outcome: Completed  01/28/2023 1253 by Silvestre Mesi, RN  Outcome: Adequate for Discharge     Problem: Impaired Mobility  Goal: Mobility/Activity is maintained at optimal level for patient  01/28/2023 1253 by Silvestre Mesi, RN  Outcome: Completed  01/28/2023 1253 by Silvestre Mesi, RN  Outcome: Adequate for Discharge   Went over AVS with pt, pt able to to teach back. IV removed prior to d/c. Pt left with all belonging.

## 2023-01-28 NOTE — PT Progress Note (Signed)
[facility]   Physical Therapy Cancellation Note      Patient:  Hailey [name] MRN#:  [id number]  Unit:  [facility] EMERGENCY DEPT Room/Bed:  8 C/8 C    01/28/2023  Time: 9:31 AM       PT Cancellation: Order     PT Order Cancellation Reason: Not clinically indicated at this time (comment required) - Rehab decision (Per OT, pt independent/supervision with no PT needs)      Louanna Raw, PT, DPT   Pager: [phone]

## 2023-01-30 ENCOUNTER — Encounter (INDEPENDENT_AMBULATORY_CARE_PROVIDER_SITE_OTHER): Payer: Self-pay | Admitting: Internal Medicine

## 2023-01-31 ENCOUNTER — Inpatient Hospital Stay: Payer: BLUE CROSS/BLUE SHIELD

## 2023-01-31 ENCOUNTER — Inpatient Hospital Stay
Payer: BLUE CROSS/BLUE SHIELD | Attending: Orthopaedic Surgery | Admitting: Rehabilitative and Restorative Service Providers"

## 2023-01-31 DIAGNOSIS — M5451 Vertebrogenic low back pain: Secondary | ICD-10-CM

## 2023-01-31 NOTE — Progress Notes (Signed)
Name:Hailey [name] Age: 42 y.o.   Date of Service: 01/31/2023  Referring Physician: Barton Fanny, MD   Date of Injury: No data found 11/09/2022  PT Date Care Plan Established/Reviewed:01/02/2023  PT Date Treatment Started:01/02/2023  OT Date Care Plan Established/Reviewed: No data found  OT Date Treatment Started: No data found    (Historic) Date of Injury:No data was found  (Historic) Date Care Plan Established/Reviewed No data was found  No data was found  (Historic) Date Treatment Started No data was found No data was found    End of Certification Date: [id number]  Sessions in Plan of Care: 24  Surgery Date: No data was found  MD Follow-up: No data was found  Medbridge Code: [facility]    Visit Count: 4   Diagnosis:    Diagnosis ICD-10-CM Associated Order   1. Vertebrogenic low back pain  M54.51           Subjective     Outcome Measure   Tool Used/Details: FOTO  Score: 63  Predicted Functional Outcome: 62    Daily Subjective   Pt reports that she had a seizure and going through some medical issues. Her back is stiff however has not been spasming as bad as before the first visit.  She would like to discharge from PT today because she wants to focus on her other medical issues going on.  She will resume PT when she feels that other things are taking care of.    Social Support/Occupation    Lives in: multiple level home    Lives with: spouse    Occupation: Forensic psychologist (desk)           Precautions: none at this time  Allergies: Fruit blend flavor [flavoring agent], Cyanoacrylate, Latex, Adhesive [wound dressing adhesive], Other, and Oxycodone    Past Medical History:   Diagnosis Date    Anxiety     Arrhythmia     WPW ablation in 2005, PVC ablation 8/23    Behcet's disease     She sees Dr. Lauro Regulus    BMI 45.0-49.9, adult     Fever 11/09/2022    Headache     migraine    Hx of ovarian cyst     Lower back pain     Meningitis 2011    Viral // treated and resolved    Pain in wrist 11/09/2022    Post-operative nausea and  vomiting     Scoliosis     moderate// used a Brace at a young age    Uveitis        Objective     Active Range of Motion (degrees)     Lumbar   Flexion functional reach: Foot  Left lateral flexion functional reach: Distal Thigh  Right lateral flexion functional reach: Mid Thigh    Strength/Myotome Testing (/5)     Left Hip   Planes of Motion   Abduction: 4-    Right Hip   Planes of Motion   Abduction: 4-                    Treatment     Therapeutic Exercises - Justified to address any of the following:  To develop strength, endurance, ROM and/or flexibility.   Update and review HEP - including patient demonstration of all exercises to ensure good form    Objective measurements taken     Home Exercises   Access Code: [facility]  URL: [email or web address]  Date: 02/01/2023  Prepared by: Lidia Collum    Exercises  - Supine Lower Trunk Rotation  - 1 x daily - 7 x weekly - 3 sets - 10 reps  - Hooklying Single Knee to Chest Stretch with Towel  - 1 x daily - 7 x weekly - 3 sets - 30 seconds hold  - Seated Piriformis Stretch  - 1 x daily - 7 x weekly - 3 sets - 30 seconds hold  - Bridge with Arms at [employer] and Feet on [employer]  - 1 x daily - 7 x weekly - 3 sets - 10 reps - 3 second hold  - Seated Flexion Stretch with Swiss Ball  - 1 x daily - 7 x weekly - 1 sets - 10 reps - 5 seconds hold  - Seated Thoracic Flexion and Rotation with Swiss Ball  - 1 x daily - 7 x weekly - 1 sets - 10 reps - 5 seconds hold  - Sit to Stand Without Arm Support  - 1 x daily - 7 x weekly - 3 sets - 10 reps       ---      Flowsheet Row ---   Total Time    Timed Minutes 35 minutes   Total Time 35 minutes           01/02/2023 01/31/2023   Hip Strength (/5)     Left hip flexion 4     Left hip extension 4+     Left hip abduction 3+  4-    Right hip flexion 4     Right hip extension 4+     Right hip abduction 3+  4-    Hip Balance     Left eyes open trial 1: 30     Right eyes open trial 1: 15     Hip Functional Strength (/5)     Sit to  Stand UE assist     Squat partial     Lumbar AROM (degrees)     Lumbar flexion functional reach Ankle  Foot    Lumbar extension 10     Left Lumbar rotation 45     Left Lumbar lateral flexion functional reach Proximal Thigh  Distal Thigh    Left Lumbar lateral flexion functional reach Pain Positive     Right Lumbar rotation 30     Right Lumbar rotation Pain Positive     Right Lumbar lateral flexion functional reach Proximal Thigh  Mid Thigh    Right Lumbar lateral flexion functional reach Pain Positive     Lumbar Balance     Eyes open (Left) (sec)Trial 1 30     Eyes open (Right) (sec) Trial 15     Lumbar Functional Strength (/5)     Lumbar Sit to stand UE assist     Lumbar Squat partial     Lower Body Strength (/5)     Left hip flexion 4     Left hip extension 4+     Left hip abduction 3+  4-    Right hip flexion 4     Right hip extension 4+     Right hip abduction 3+  4-          Assessment   Pt has progressed well with skilled physical therapy as demonstrated by improved Strength and pain levels and functional outcomes. Pt has been instructed in comprehensive HEP and demonstrates understanding for self maintenance. Pt has been advised to continue HEP and follow up  with PT or MD if any further issues arise.       Prognosis: good  Plan   Discharge      Goals      Goal 1: STG: Pt will increase lumbar extension to 15 deg in order to reach behind her or lay supine without pain in the lumbar.    01/31/23 BMS: Goal met. Able to lay supine > 20 minutes or reach behind her without pain in the lumbar.    Sessions: 6      Goal 2: Patient will demonstrate independence in prescribed HEP with proper form, sets and reps for safe discharge to an independent program.    01/31/23 BMS: Goal met. Demos good understanding and independence with home program.      Sessions: 24      Goal 3: Pt will increase FOTO score from 51% to 66% to demonstrate return to function with PT intervention prior to discharge.     01/31/23 BMS: Goal not met.  Currently 63% based on survey taken on 01/21/23.     Sessions: 24      Goal 4: Pt will demonstrate increased lumbar flexion to the floor in order to bend over enough to lift objects off the floor.     01/31/23 BMS: Goal met. Able to flex lumbar to touch the floor. Able to lift objects off the floor without limitation.    Sessions: 24          Goal 5: Pt will increase hip abduction strength >4+/5 in order to lift and carry groceries into the house.     01/31/23 BMS: Goal met. Able to lift and carry groceries into the house.    Sessions: [place], DPT

## 2023-02-02 ENCOUNTER — Inpatient Hospital Stay: Payer: BLUE CROSS/BLUE SHIELD | Admitting: Rehabilitative and Restorative Service Providers"

## 2023-02-02 ENCOUNTER — Encounter (INDEPENDENT_AMBULATORY_CARE_PROVIDER_SITE_OTHER): Payer: Self-pay | Admitting: Family Medicine

## 2023-02-02 ENCOUNTER — Telehealth (INDEPENDENT_AMBULATORY_CARE_PROVIDER_SITE_OTHER): Payer: BLUE CROSS/BLUE SHIELD | Admitting: Family Medicine

## 2023-02-02 DIAGNOSIS — D84821 Immunodeficiency due to drugs: Secondary | ICD-10-CM

## 2023-02-02 DIAGNOSIS — Z79899 Other long term (current) drug therapy: Secondary | ICD-10-CM

## 2023-02-02 DIAGNOSIS — Z09 Encounter for follow-up examination after completed treatment for conditions other than malignant neoplasm: Secondary | ICD-10-CM

## 2023-02-02 DIAGNOSIS — M352 Behcet's disease: Secondary | ICD-10-CM

## 2023-02-02 DIAGNOSIS — R4701 Aphasia: Secondary | ICD-10-CM

## 2023-02-02 DIAGNOSIS — E782 Mixed hyperlipidemia: Secondary | ICD-10-CM

## 2023-02-02 NOTE — Progress Notes (Signed)
Subjective:      Patient ID: Hailey [name] is a 42 y.o. female.    Verbal consent has been obtained from the patient to conduct a video and telephone visit: yes    Chief Complaint:  Chief Complaint   Patient presents with    Hospital Follow-up            HPI   Here for follow-up from recent hospitalization.   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  Hospital course: See hospital [place] summary  Prior hospitalization within past 30 days: No   Prior hospitalization within past 6 months: Yes  Comorbid conditions: CAD, connective tissue disease, and morbid obesity  Medical procedure(s) performed: IV hydration  Surgical procedure(s) performed: none  Diagnostic tests performed: CT head and MRI brain  Pending tests: none  Home care: ADL's intact  Specialist follow-up:  rheumatology and neurology  Post-hospital monitoring:  monitoring for recurrent symptoms  Mobility status: normal mobility     Medication Reconciliation(required):  Home medications were reviewed and reconciled with current medication list within electronic medical record. Patient is adherent with current medication regimen.    Advanced Directives:  Not addressed today.    Patient Active Problem List   Diagnosis    Morbid obesity    Herpes labialis    Non-seasonal allergic rhinitis, unspecified trigger    Migraine with aura and without status migrainosus, not intractable    Post-cholecystectomy syndrome    History of Wolff-Parkinson-White (WPW) syndrome    Scoliosis deformity of spine    Behcet's disease    Immunocompromised state due to drug therapy    S/P hysterectomy    Premature ventricular contractions (PVCs) (VPCs)    Palpitations    NICM (nonischemic cardiomyopathy)    PVC's (premature ventricular contractions)    Abdominal pain, unspecified abdominal location    Elevated blood pressure reading in office without diagnosis of  hypertension    Seronegative rheumatoid arthritis    Anterior uveitis    Generalized abdominal pain    Speech abnormality       No outpatient medications have been marked as taking for the 02/02/23 encounter (Telemedicine Visit) with Loletha Grayer, MD.       Allergies   Allergen Reactions    Fruit Blend Flavor [Flavoring Agent] Anaphylaxis and Swelling     Tropical Fruit     Cyanoacrylate     Latex Hives, Swelling, Other (See Comments) and Drug-Induced Flushing    Adhesive [Wound Dressing Adhesive] Itching    Other Itching and Rash     Sutures Monocryl    Oxycodone Hallucinations       Past Medical History:   Diagnosis Date    Anxiety     Arrhythmia     WPW ablation in 2005, PVC ablation 8/23    Behcet's disease     She sees Dr. Lauro Regulus    BMI 45.0-49.9, adult     Fever 11/09/2022    Headache     migraine    Hx of ovarian cyst     Lower back pain     Meningitis 2011    Viral // treated and resolved    Pain in wrist 11/09/2022    Post-operative nausea and vomiting     Scoliosis     moderate// used a Brace at a young age    Uveitis  Past Surgical History:   Procedure Laterality Date    ABLATION - PVC'S N/A 06/08/2022    Procedure: Ablation - PVC's;  Surgeon: Jaynie Bream, MD;  Location: [facility];  Service: Cardiovascular;  Laterality: N/A;  SAME DAY DISCHARGE  CARTO NO TEE    ARTHROTOMY, WRIST Right 12/03/2020    Procedure: RIGHT UPPER EXTREMITY SYNOVIAL BIOPSY;  Surgeon: Lenox Ahr, MD;  Location: [facility];  Service: Plastics;  Laterality: Right;    BIOPSY, LYMPH NODE N/A 07/23/2018    Procedure: BIOPSY, LYMPH NODE;  Surgeon: Neldon Labella, MD;  Location: [place] [facility];  Service: ENT;  Laterality: N/A;  NECK DEEP LYMPH NODE BIOPSY    BONE MARROW BIOPSY  2020    led to behcet's dx    CARDIAC ABLATION  2005    [facility] for Wolff-Parkinson-White    CHOLECYSTECTOMY  2009    [facility] DIAGNOSTIC  2010    DILATION AND CURETTAGE OF UTERUS  2010    FINGER GANGLION CYST EXCISION   04/2018    GANGLION CYST EXCISION  2014    04/2018 cyst removed from index finger-[place]  2016    HYSTERECTOMY  2012    HYSTEROSCOPY  2011    indication: Abnormal bleeding    LAPAROSCOPIC, LYSIS, ADHESIONS N/A 11/09/2022    Procedure: LAPAROSCOPIC LYSIS OF ADHESIONS;  Surgeon: Lindell Noe, MD;  Location: [facility];  Service: Gynecology;  Laterality: N/A;    LAPAROSCOPIC, SALPINGECTOMY Left 11/09/2022    Procedure: LAPAROSCOPIC, SALPINGECTOMY LEFT;  Surgeon: Lindell Noe, MD;  Location: [facility];  Service: Gynecology;  Laterality: Left;    LAPAROSCOPY, DIAGNOSTIC N/A 11/09/2022    Procedure: LAPAROSCOPY, DIAGNOSTIC;  Surgeon: Lindell Noe, MD;  Location: [place] [facility];  Service: Gynecology;  Laterality: N/A;  0000000    UMBILICAL HERNIA REPAIR  2016    UPPER GASTROINTESTINAL ENDOSCOPY  2009    WISDOM TOOTH EXTRACTION  1998       Family History   Adopted: Yes       Social History     Tobacco Use    Smoking status: Never    Smokeless tobacco: Never   Vaping Use    Vaping status: Never Used   Substance Use Topics    Alcohol use: Never    Drug use: Never         The following sections were reviewed this encounter by the provider:   Tobacco  Allergies  Meds  Problems  Med Hx  Surg Hx  Fam Hx          Review of Systems    Vitals:  LMP  (LMP Unknown)     Objective:     Physical Exam    Assessment:     1. Hospital discharge follow-up    2. Aphasia    3. Behcet's disease    4. Immunocompromised state due to drug therapy    5. Mixed hyperlipidemia    6. Morbid obesity      Plan:   Disease/Illness Education:  Patient educated regarding symptom management, adherence to current medication regimen and current plan of care. Patient encouraged to contact us with any questions or needs.  Patient agreeable to current plan of care and verbalizes understanding of information provided.  Home Health Needs:  No current home health services needed at this time.  Community Service  Needs:  No community services needed at this  time.  Establishment of Referral Orders:  No referrals needed at this time.  Communication with Health Care Providers:  No active communication with additional health care providers needed at this time.  [place] 90+ day Treatment Plan:  Not applicable    F/u with Neuro and rheum as scheduled.  Will plan on f/u 1 month to discuss how buspar is working on daily dosing for her anxiety  Will repeat B12 level in 1 month    Transitional Care Management Coding Criteria:  2) Face-to-face visit occurred within 7 days of discharge.  3) Complexity of medical decision making is high.      Loletha Grayer, MD

## 2023-02-03 ENCOUNTER — Encounter: Payer: Self-pay | Admitting: Neurology

## 2023-02-03 ENCOUNTER — Ambulatory Visit: Payer: BLUE CROSS/BLUE SHIELD | Attending: Neurology | Admitting: Neurology

## 2023-02-03 VITALS — BP 136/85 | HR 96 | Resp 16 | Ht 66.0 in

## 2023-02-03 DIAGNOSIS — H209 Unspecified iridocyclitis: Secondary | ICD-10-CM

## 2023-02-03 DIAGNOSIS — G43709 Chronic migraine without aura, not intractable, without status migrainosus: Secondary | ICD-10-CM

## 2023-02-03 DIAGNOSIS — H539 Unspecified visual disturbance: Secondary | ICD-10-CM

## 2023-02-03 DIAGNOSIS — H532 Diplopia: Secondary | ICD-10-CM

## 2023-02-03 DIAGNOSIS — R569 Unspecified convulsions: Secondary | ICD-10-CM

## 2023-02-03 DIAGNOSIS — M352 Behcet's disease: Secondary | ICD-10-CM

## 2023-02-03 MED ORDER — DIAZEPAM 5 MG PO TABS
ORAL_TABLET | ORAL | 0 refills | Status: DC
Start: 2023-02-03 — End: 2023-04-26

## 2023-02-03 NOTE — Progress Notes (Signed)
[place] / Scheduling / on-call: [phone]  Clinic Tel: [phone]  Fax: [phone]  Send [place] a mychart or inbasket message for the fastest response  ~~~~~~~~~~~~~~~~~~~~~~~~~~~~~~~~~~~~~~~~~~~~~~~~~~~~~~~~~~~~~~~~~~~~~~~~~~~~~~~~~~~~    CC: Behcet's    HPI:   History was obtained from records review, patient, husband    42 y.o. year-old female with clinically established Behcet's (arthralgia, sialadenitis /sicca, +HLAB51, +pathergy, PET-pos adenopathy) on Humira, otezla and prednisone. I saw her 2 years ago for migraines which were felt to be independent of Behcet's and ultimately have been well controlled (care of Dr Loyola Mast)    ForBehcet's she was maintained on Humira, otezla and prednisone taper, at 20mg  when she worsened.     In Feb 2024, she developed scleritis / anterior  uveitis and started on dexamethasone gtt by ophthalmology. This caused worsening headaches, light sensitivity, etc. She also developed vertical diplopia at this time that has only slightly improved.     On 01/25/23 she developed an episode of brief speech arrest, unresponsiveness, post-ictal confusion for a few  minutes with amnesia of the episode. This was witnessed by husband.       EXAM  Visit Vitals  BP 136/85 (BP Site: Left arm, Patient Position: Sitting, Cuff Size: Large)   Pulse 96   Resp 16   Ht 1.676 m (5\' 6" )   LMP  (LMP Unknown)   SpO2 96%   BMI 50.84 kg/m     General:   Fundoscopic: normal b/l. Suspected temporal pallor OS in a small region. Red desat OS. Corrected [place] worse OS vs OD. no papilledema.   Psychiatric.   Mental Status: The patient was awake, alert, appropriate  Cranial Nerves: CN II-XII : no apd. Horizontal diplopia on far right gaze with dysconjugate eye movements noted.   Motor: 5/5  Reflex:   Sensation:   Coordination:   Gait: normal      Imaging     Reports:  MRI Brain W WO Contrast    Result Date: 01/28/2023  Impression:  No acute  intracranial or orbital abnormality. Milan Consepcion Hearing, MD 01/28/2023 7:57 AM    CT Head without Contrast    Result Date: 01/27/2023  Impression:   1.No intracranial abnormality is seen. 2. Given the history and symptoms, follow-up MRI may be of benefit. Melina Copa, MD 01/27/2023 2:29 PM    MRI Lumbar Spine WO Contrast    Result Date: 12/19/2022  Impression:  1.  No acute osseous or soft tissue abnormality. 2.  Normal MR appearance of the distal spinal cord, conus medullaris and cauda equina. 3.  Rotatory lumbar levoscoliosis. 4.  Lumbar spondylosis and facet arthrosis without central stenosis or nerve root compression. Please see full details and segmental analysis above. Electronically signed by: Peggye Ley M.D. [facility] CM: 12/19/22       Testing       Labs  Lab Results   Component Value Date    HGBA1C 5.4 01/28/2023    EGFR >60.0 01/27/2023    CREAT 0.8 01/27/2023    WBC 12.51 (H) 01/27/2023    LYMPHOABS 2.5 07/25/2022    IGG 1,065 01/25/2021    IGA 216 01/25/2021    IGM 198 01/25/2021    B12 297 01/27/2023    VITD 28 (L) 01/23/2023       ASSESSMENT / PLAN  RTC: Return in about 1 month (around 03/06/2023) for Waunita Schooner.    1. Behcet's disease  2. Chronic migraine without aura without status migrainosus, not intractable    3. Seizure-like activity    4. Vision changes    5. Binocular vision disorder with diplopia    6. Anterior uveitis        41y/o F with behcet's, hx migraine. Now developed anterior uveitis but also binocular diplopia and dysconjugate gaze. Further, she had a seizure-like episode in inpatietn with aphasia, unresponsiveness, amnesia with eyes open and post ictal confusion. These raise concern for neuro behcet's.     The MRI performed inpatient excludes structural etiology for seizure, but motion artifact and sequences limits evaluation of diplopia    Agree with transition humira to infliximab; suspect she will need a relatively high dose or addition of  additional immunosuppressant. Will discuss w/ Dr Lauro Regulus. Check humira ADAs on Monday.    She had a single seizure like episode Wednesday. Will follow. Avoid driving x 6 months. If another seizure or syncopal episode occurs then she will require AEDs    PLAN  MRI brain +/- gad with thin cuts through brainstem  Labs Monday; including humira ab's  LP after MRI  Pt will arrange for [place] to receive records from Dr Lazaro Arms; if OCT not performed, would consider pursuing OCT    Orders today (details below)  Orders Placed This Encounter   Procedures    MRI Brain W WO Contrast    FL Guid Lumbar Puncture Diagnostic    Adalimumab & Anti-Adalimumab Ab (Serial)    CSF Cell Count Tube #4    CSF IgG Synthesis Rate    CSF Oligoclonal Bands    Lab Use Only CSF Encephalopathy Autoimmune Evaluation    CSF JC Polyoma Virus DNA, PCR    CSF Glucose    CSF Protein    CBC and differential    PT/APTT     Orders Placed This Encounter   Medications    diazePAM (VALIUM) 5 MG tablet     Sig: Take 45 min prior to mri. Take an extra tab if needed     Dispense:  2 tablet     Refill:  0       I spent 100 minutes reviewing the records, talking with the patient and developing their care plan. This excludes time for separately billed procedures.      Hedwig Morton, MD PHD  Director, Neuroimmunology & [place]  [facility], [id number], [place], [place] [address]    Main line / scheduling: [phone]    After-hours Neurologist On Call: [phone]    For direct access, contact our [place] or Gordy Savers RN  Tel [phone]  Fax [phone]     Fastest replies are via [employer], commercial  ===================================================================  NFL (neurofilament light chain) Insurance Justification  Serum / plasma Neurofilament light chain is a well-established biomarker for MS relapses, and potentially for non-relapsing progressive disease  as well (it is sensitive but not necessarily specific). It also has established utility in other CNS and peripheral nerve degenerative conditions. Recently, testing has become commercially available.  This data has emerged from multiple independent groups -- eg, [employer] working groups in [place] & [place]; [place], [place], and [place] reference laboratories; [employer]; and studies of MS cohorts at [employer]; and clinical trials for various MS therapies. NFL can improve upon the sensitivity of MRI (current standard of care) for detecting disease activity/progression.  Further, most MS therapies have been shown to decrease sNFL levels, with higher efficacy therapies demonstrating greater reduction than lower efficacy therapies. Overall, when sNFL values are interpreted in context of patient's clinical situation, they are likely to impact management and treatment decisions.     PATIENT INSTRUCTIONS PROVIDED  Patient was given an "After Visit Summary" with a copy of the testing orders, medications and the following other instructions:  There are no Patient Instructions on file for this visit.    IMPORTED INFORMATION FROM MEDICAL RECORDS   Diagnosis ICD-10-CM Associated Order   1. Behcet's disease  M35.2 Adalimumab & Anti-Adalimumab Ab (Serial)     MRI Brain W WO Contrast     CSF Cell Count Tube #4     CSF IgG Synthesis Rate     CSF Oligoclonal Bands     Lab Use Only CSF Encephalopathy Autoimmune Evaluation     CSF JC Polyoma Virus DNA, PCR     CSF Glucose     CSF Protein     CBC and differential     PT/APTT      2. Chronic migraine without aura without status migrainosus, not intractable  G43.709 Adalimumab & Anti-Adalimumab Ab (Serial)     MRI Brain W WO Contrast     CSF Cell Count Tube #4     CSF IgG Synthesis Rate     CSF Oligoclonal Bands     Lab Use Only CSF Encephalopathy Autoimmune Evaluation     CSF JC Polyoma Virus DNA, PCR     CSF Glucose     CSF Protein     CBC and differential     PT/APTT      3.  Seizure-like activity  R56.9 Adalimumab & Anti-Adalimumab Ab (Serial)     MRI Brain W WO Contrast     CSF Cell Count Tube #4     CSF IgG Synthesis Rate     CSF Oligoclonal Bands     Lab Use Only CSF Encephalopathy Autoimmune Evaluation     CSF JC Polyoma Virus DNA, PCR     CSF Glucose     CSF Protein     CBC and differential     PT/APTT      4. Vision changes  H53.9 Adalimumab & Anti-Adalimumab Ab (Serial)     MRI Brain W WO Contrast     CSF Cell Count Tube #4     CSF IgG Synthesis Rate     CSF Oligoclonal Bands     Lab Use Only CSF Encephalopathy Autoimmune Evaluation     CSF JC Polyoma Virus DNA, PCR     CSF Glucose     CSF Protein     CBC and differential     PT/APTT      5. Binocular vision disorder with diplopia  H53.2 Adalimumab & Anti-Adalimumab Ab (Serial)     MRI Brain W WO Contrast     CSF Cell Count Tube #4     CSF IgG Synthesis Rate     CSF Oligoclonal Bands     Lab Use Only CSF Encephalopathy Autoimmune Evaluation     CSF JC Polyoma Virus DNA, PCR     CSF Glucose     CSF Protein     CBC and differential     PT/APTT      6. Anterior uveitis  H20.9 Adalimumab & Anti-Adalimumab Ab (Serial)     MRI Brain W WO Contrast     CSF Cell Count  Tube #4     CSF IgG Synthesis Rate     CSF Oligoclonal Bands     Lab Use Only CSF Encephalopathy Autoimmune Evaluation     CSF JC Polyoma Virus DNA, PCR     CSF Glucose     CSF Protein     CBC and differential     PT/APTT        Orders Placed This Encounter   Procedures    MRI Brain W WO Contrast     Standing Status:   Future     Standing Expiration Date:   02/03/2024     Scheduling Instructions:      To schedule your procedure please call your chosen [facility] Scheduling Number:      [place]      [facility] Scheduling [phone]     Order Specific Question:   Does the patient have a pacemaker or defibrillator?     Answer:   No     Order Specific Question:   Is the patient pregnant?     Answer:   No     Order  Specific Question:   Clinical info for radiologist     Answer:   hx behcet's, vertical diplopia. r/o brain stem neuritis or brainstem inflammatory lesion. 3T MRI. Thin cuts through brainstem.     Order Specific Question:   Release to patient     Answer:   Immediate    FL Guid Lumbar Puncture Diagnostic     Standing Status:   Future     Standing Expiration Date:   02/03/2024     Order Specific Question:   Reason for Exam:     Answer:   MS evaluation. CSF orders in EPIC.     Order Specific Question:   Release to patient     Answer:   Immediate     Order Specific Question:   Is the patient pregnant?     Answer:   No    Adalimumab & Anti-Adalimumab Ab (Serial)     Standing Status:   Future     Number of Occurrences:   1     Standing Expiration Date:   02/03/2024     Order Specific Question:   Release to patient     Answer:   Immediate    CSF Cell Count Tube #4     Standing Status:   Future     Standing Expiration Date:   02/03/2024     Order Specific Question:   Specimen type/source/volume     Answer:   include manual diff     Order Specific Question:   Release to patient     Answer:   Immediate    CSF IgG Synthesis Rate     Standing Status:   Future     Standing Expiration Date:   02/03/2024     Order Specific Question:   Release to patient     Answer:   Immediate    CSF Oligoclonal Bands     csf     Standing Status:   Future     Standing Expiration Date:   02/03/2024     Order Specific Question:   Specimen type/source/volume     Answer:   include serum comparator     Order Specific Question:   Release to patient     Answer:   Immediate    Lab Use Only CSF Encephalopathy Autoimmune Evaluation  Standing Status:   Future     Standing Expiration Date:   02/03/2024     Order Specific Question:   Specimen type/source/volume     Answer:   csf     Order Specific Question:   Release to patient     Answer:   Immediate    CSF JC Polyoma Virus DNA, PCR     Standing Status:   Future     Standing Expiration Date:   02/03/2024     Order  Specific Question:   Specimen type/source/volume     Answer:   csf     Order Specific Question:   Release to patient     Answer:   Immediate    CSF Glucose     Standing Status:   Future     Standing Expiration Date:   02/03/2024     Order Specific Question:   Specimen type/source/volume     Answer:   csf     Order Specific Question:   Release to patient     Answer:   Immediate    CSF Protein     Standing Status:   Future     Standing Expiration Date:   02/03/2024     Order Specific Question:   Specimen type/source/volume     Answer:   csf     Order Specific Question:   Release to patient     Answer:   Immediate    CBC and differential     Standing Status:   Future     Standing Expiration Date:   02/03/2024     Order Specific Question:   Release to patient     Answer:   Immediate    PT/APTT     Standing Status:   Future     Standing Expiration Date:   02/03/2024     Order Specific Question:   Release to patient     Answer:   Immediate

## 2023-02-06 ENCOUNTER — Encounter: Payer: Self-pay | Admitting: Internal Medicine

## 2023-02-06 ENCOUNTER — Other Ambulatory Visit: Payer: BLUE CROSS/BLUE SHIELD

## 2023-02-06 ENCOUNTER — Ambulatory Visit: Payer: BLUE CROSS/BLUE SHIELD | Attending: Internal Medicine

## 2023-02-06 ENCOUNTER — Encounter (INDEPENDENT_AMBULATORY_CARE_PROVIDER_SITE_OTHER): Payer: Self-pay | Admitting: Family Medicine

## 2023-02-06 VITALS — BP 129/82 | HR 87 | Temp 98.4°F | Wt 317.0 lb

## 2023-02-06 DIAGNOSIS — M0600 Rheumatoid arthritis without rheumatoid factor, unspecified site: Secondary | ICD-10-CM

## 2023-02-06 DIAGNOSIS — R569 Unspecified convulsions: Secondary | ICD-10-CM

## 2023-02-06 DIAGNOSIS — M352 Behcet's disease: Secondary | ICD-10-CM

## 2023-02-06 DIAGNOSIS — H209 Unspecified iridocyclitis: Secondary | ICD-10-CM

## 2023-02-06 DIAGNOSIS — H532 Diplopia: Secondary | ICD-10-CM | POA: Insufficient documentation

## 2023-02-06 DIAGNOSIS — H539 Unspecified visual disturbance: Secondary | ICD-10-CM | POA: Insufficient documentation

## 2023-02-06 DIAGNOSIS — G43709 Chronic migraine without aura, not intractable, without status migrainosus: Secondary | ICD-10-CM | POA: Insufficient documentation

## 2023-02-06 LAB — CBC AND DIFFERENTIAL
Absolute NRBC: 0 10*3/uL (ref 0.00–0.00)
Basophils Absolute Automated: 0.05 10*3/uL (ref 0.00–0.08)
Basophils Automated: 0.5 %
Eosinophils Absolute Automated: 0.16 10*3/uL (ref 0.00–0.44)
Eosinophils Automated: 1.5 %
Hematocrit: 41.9 % (ref 34.7–43.7)
Hgb: 14.1 g/dL (ref 11.4–14.8)
Immature Granulocytes Absolute: 0.04 10*3/uL (ref 0.00–0.07)
Immature Granulocytes: 0.4 %
Instrument Absolute Neutrophil Count: 5.54 10*3/uL (ref 1.10–6.33)
Lymphocytes Absolute Automated: 4.49 10*3/uL — ABNORMAL HIGH (ref 0.42–3.22)
Lymphocytes Automated: 41 %
MCH: 31.5 pg (ref 25.1–33.5)
MCHC: 33.7 g/dL (ref 31.5–35.8)
MCV: 93.7 fL (ref 78.0–96.0)
MPV: 8.7 fL — ABNORMAL LOW (ref 8.9–12.5)
Monocytes Absolute Automated: 0.68 10*3/uL (ref 0.21–0.85)
Monocytes: 6.2 %
Neutrophils Absolute: 5.54 10*3/uL (ref 1.10–6.33)
Neutrophils: 50.4 %
Nucleated RBC: 0 /100 WBC (ref 0.0–0.0)
Platelets: 353 10*3/uL — ABNORMAL HIGH (ref 142–346)
RBC: 4.47 10*6/uL (ref 3.90–5.10)
RDW: 13 % (ref 11–15)
WBC: 10.96 10*3/uL — ABNORMAL HIGH (ref 3.10–9.50)

## 2023-02-06 LAB — COMPREHENSIVE METABOLIC PANEL
ALT: 37 U/L (ref 0–55)
AST (SGOT): 28 U/L (ref 5–41)
Albumin/Globulin Ratio: 1.1 (ref 0.9–2.2)
Albumin: 4 g/dL (ref 3.5–5.0)
Alkaline Phosphatase: 59 U/L (ref 37–117)
Anion Gap: 10 (ref 5.0–15.0)
BUN: 10 mg/dL (ref 7.0–21.0)
Bilirubin, Total: 0.5 mg/dL (ref 0.2–1.2)
CO2: 22 mEq/L (ref 17–29)
Calcium: 9.6 mg/dL (ref 8.5–10.5)
Chloride: 107 mEq/L (ref 99–111)
Creatinine: 0.9 mg/dL (ref 0.4–1.0)
Globulin: 3.6 g/dL (ref 2.0–3.6)
Glucose: 109 mg/dL — ABNORMAL HIGH (ref 70–100)
Potassium: 4.4 mEq/L (ref 3.5–5.3)
Protein, Total: 7.6 g/dL (ref 6.0–8.3)
Sodium: 139 mEq/L (ref 135–145)
eGFR: >60 mL/min/{1.73_m2} (ref >=60)

## 2023-02-06 LAB — C-REACTIVE PROTEIN: C-Reactive Protein: <0.1 mg/dL (ref 0.0–1.1)

## 2023-02-06 LAB — PT AND APTT
PT INR: 1 (ref 0.9–1.1)
PT: 11.2 s (ref 10.1–12.9)
PTT: 33 s (ref 27–39)

## 2023-02-06 LAB — HEMOLYSIS INDEX: Hemolysis Index: 13 Index (ref 0–24)

## 2023-02-06 LAB — SEDIMENTATION RATE: Sed Rate: 24 mm/Hr — ABNORMAL HIGH (ref 0–20)

## 2023-02-06 MED ORDER — SODIUM CHLORIDE 0.9 % IV SOLN
700.0000 mg | Freq: Once | INTRAVENOUS | Status: AC
Start: 2023-02-06 — End: 2023-02-06
  Administered 2023-02-06: 700 mg via INTRAVENOUS
  Filled 2023-02-06: qty 700

## 2023-02-06 MED ORDER — SODIUM CHLORIDE 0.9 % IV SOLN
250.0000 mL | INTRAVENOUS | Status: DC
Start: 2023-02-06 — End: 2023-02-06
  Administered 2023-02-06: 250 mL via INTRAVENOUS

## 2023-02-06 MED ORDER — ALBUTEROL SULFATE (2.5 MG/3ML) 0.083% IN NEBU
2.5000 mg | INHALATION_SOLUTION | Freq: Once | RESPIRATORY_TRACT | Status: DC | PRN
Start: 2023-02-06 — End: 2023-02-06

## 2023-02-06 MED ORDER — METHYLPREDNISOLONE SODIUM SUCC 40 MG IJ SOLR (WRAP)
40.0000 mg | Freq: Once | INTRAMUSCULAR | Status: AC
Start: 2023-02-06 — End: 2023-02-06
  Administered 2023-02-06: 40 mg via INTRAVENOUS
  Filled 2023-02-06: qty 1

## 2023-02-06 MED ORDER — ACETAMINOPHEN 325 MG PO TABS
650.0000 mg | ORAL_TABLET | Freq: Once | ORAL | Status: AC
Start: 2023-02-06 — End: 2023-02-06
  Administered 2023-02-06: 650 mg via ORAL
  Filled 2023-02-06: qty 2

## 2023-02-06 MED ORDER — SODIUM CHLORIDE 0.9 % IV SOLN
25.0000 mL/h | INTRAVENOUS | Status: DC | PRN
Start: 2023-02-06 — End: 2023-02-06

## 2023-02-06 MED ORDER — DIPHENHYDRAMINE HCL 50 MG/ML IJ SOLN
25.0000 mg | Freq: Once | INTRAMUSCULAR | Status: DC | PRN
Start: 2023-02-06 — End: 2023-02-06

## 2023-02-06 MED ORDER — SODIUM CHLORIDE 0.9 % IV BOLUS
500.0000 mL | Freq: Once | INTRAVENOUS | Status: DC | PRN
Start: 2023-02-06 — End: 2023-02-06

## 2023-02-06 MED ORDER — FAMOTIDINE 10 MG/ML IV SOLN (WRAP)
20.0000 mg | Freq: Once | INTRAVENOUS | Status: DC | PRN
Start: 2023-02-06 — End: 2023-02-06

## 2023-02-06 MED ORDER — DIPHENHYDRAMINE HCL 50 MG/ML IJ SOLN
50.0000 mg | Freq: Once | INTRAMUSCULAR | Status: DC | PRN
Start: 2023-02-06 — End: 2023-02-06

## 2023-02-06 MED ORDER — EPINEPHRINE HCL 1 MG/ML ADULT ANAPHYLAXIS KIT
0.3000 mg | Freq: Once | INTRAMUSCULAR | Status: DC | PRN
Start: 2023-02-06 — End: 2023-02-06

## 2023-02-06 MED ORDER — METHYLPREDNISOLONE SODIUM SUCC 40 MG IJ SOLR (WRAP)
40.0000 mg | Freq: Once | INTRAMUSCULAR | Status: DC | PRN
Start: 2023-02-06 — End: 2023-02-06

## 2023-02-06 MED ORDER — DIPHENHYDRAMINE HCL 50 MG/ML IJ SOLN
25.0000 mg | Freq: Once | INTRAMUSCULAR | Status: AC
Start: 2023-02-06 — End: 2023-02-06
  Administered 2023-02-06: 25 mg via INTRAVENOUS
  Filled 2023-02-06: qty 1

## 2023-02-06 NOTE — Progress Notes (Signed)
.  [facility]   Visit Date: 02/06/2023        Hailey [name] is a 42 y.o. female patient of Dr Tarry Kos    Since her last visit, she has been doing well.   Patient presents to the infusion center for Infliximab  Overall, she states her energy level is good.  The patient is able to perform most usual functions.. Her appetite is reported to be intact, with no significant changes in weight.. She reports arthralgias, diarrhea, fatigue, headaches, and nausea. She has no other concerns. There are no social work or other referral needs at this time.    Diagnosis:  Behcet's disease  Treatment on Clinical Trial: Not On Study (NOS)        IV Pre Hydration: N/A    Pre-Medications: Tylenol 650mg  po,  Benadryl 25mg  iVP, Solu-medrol 40mg  IVP    Line Type:  Peripheral    Right hand  Blood Return verified prior to administration: Yes      Vital Signs:  Patient Vitals for the past 12 hrs:   BP Temp Pulse   02/06/23 1136 129/82 -- 87   02/06/23 1041 127/83 -- 73   02/06/23 1006 121/69 -- 73   02/06/23 0822 145/89 98.4 F (36.9 C) 96      Body surface area is 2.59 meters squared.    '  Chemistry        Component Value Date/Time    NA 139 02/06/2023 0754    K 4.4 02/06/2023 0754    CL 107 02/06/2023 0754    CO2 22 02/06/2023 0754    BUN 10.0 02/06/2023 0754    CREAT 0.9 02/06/2023 0754    CREAT 0.72 07/25/2022 1133    GLU 109 (H) 02/06/2023 0754        Component Value Date/Time    CA 9.6 02/06/2023 0754    ALKPHOS 59 02/06/2023 0754    AST 28 02/06/2023 0754    ALT 37 02/06/2023 0754    BILITOTAL 0.5 02/06/2023 0754            Lab Results   Component Value Date    WBC 10.96 (H) 02/06/2023    HGB 14.1 02/06/2023    PLT 353 (H) 02/06/2023    NEUTROABS 5.54 02/06/2023           Chemo Given within the past 12 hours:   "Chemo/Biotherapy Given" (last 12 hours)       None        Infliximab-axxq 700mg  initiated at rate of 40cc/hr. Titrated q83min to rates of 80cchr, 150cc/hr, to max  rate of 250cc/hr.        Hypersensitivity Reaction Noted: No  IV Post Hydration: No, not required  Treatment Outcome:Patient tolerated treatment well.  Blood return verified after administration: Yes. Line flushed via primary and then PIV pulled    Education: Patient/Family educated regarding the expected outcomes/side effects possible interactions of treatments and medications. Patient verbalizes understanding.    Follow-up Plan: Discharged in stable condition. Accompanied by, husband. Instructed to contact physician if any complications or unmanageable symptoms occur.    A return to the [facility] for the next treatment, has been scheduled.  Time of discharge:1148  RTC:   Appt made for 03/06/2023      Luna Kitchens, RN, OCN    02/06/2023  1:45 PM

## 2023-02-07 ENCOUNTER — Encounter (INDEPENDENT_AMBULATORY_CARE_PROVIDER_SITE_OTHER): Payer: Self-pay

## 2023-02-08 ENCOUNTER — Encounter (INDEPENDENT_AMBULATORY_CARE_PROVIDER_SITE_OTHER): Payer: Self-pay | Admitting: Internal Medicine

## 2023-02-08 ENCOUNTER — Ambulatory Visit (INDEPENDENT_AMBULATORY_CARE_PROVIDER_SITE_OTHER): Payer: BLUE CROSS/BLUE SHIELD | Admitting: Internal Medicine

## 2023-02-08 ENCOUNTER — Inpatient Hospital Stay: Payer: BLUE CROSS/BLUE SHIELD

## 2023-02-08 VITALS — BP 137/77 | HR 114 | Ht 68.0 in | Wt 319.0 lb

## 2023-02-08 DIAGNOSIS — M3500 Sicca syndrome, unspecified: Secondary | ICD-10-CM

## 2023-02-08 DIAGNOSIS — K760 Fatty (change of) liver, not elsewhere classified: Secondary | ICD-10-CM

## 2023-02-08 DIAGNOSIS — M25571 Pain in right ankle and joints of right foot: Secondary | ICD-10-CM

## 2023-02-08 DIAGNOSIS — K120 Recurrent oral aphthae: Secondary | ICD-10-CM

## 2023-02-08 DIAGNOSIS — Z79899 Other long term (current) drug therapy: Secondary | ICD-10-CM

## 2023-02-08 DIAGNOSIS — M79671 Pain in right foot: Secondary | ICD-10-CM

## 2023-02-08 DIAGNOSIS — M0600 Rheumatoid arthritis without rheumatoid factor, unspecified site: Secondary | ICD-10-CM

## 2023-02-08 DIAGNOSIS — M79672 Pain in left foot: Secondary | ICD-10-CM

## 2023-02-08 DIAGNOSIS — R590 Localized enlarged lymph nodes: Secondary | ICD-10-CM

## 2023-02-08 DIAGNOSIS — M25572 Pain in left ankle and joints of left foot: Secondary | ICD-10-CM

## 2023-02-08 DIAGNOSIS — G8929 Other chronic pain: Secondary | ICD-10-CM

## 2023-02-08 DIAGNOSIS — M352 Behcet's disease: Secondary | ICD-10-CM

## 2023-02-08 MED ORDER — OTEZLA 30 MG PO TABS
1.0000 | ORAL_TABLET | Freq: Two times a day (BID) | ORAL | 1 refills | Status: DC
Start: 2023-02-08 — End: 2023-05-29

## 2023-02-08 NOTE — Progress Notes (Signed)
RHEUMATOLOGY FOLLOW UP NOTE      PCP: Loletha Grayer, MD    HPI:    This patient is a 42 y.o. year female with Past Medical History of Prior Cholecystectomy, Wold-Parkinson White, Prior Hysterectomy who returns for follow up of Behcet's HLAB51+, recurrent oral ulcers, pathergy, low grade fevers and seronegative arthritis.  -ablation procedure for arrythmia 06/2022  -She is on Humria weekly for inflammatory arthritis from [place] for recurrent aphthous ulcers  -She is on dapsone from Dermatology, clindamycin at night  Dermatologist started her on dapsone-she has had a few scars that are healing-had injections from her dermatologist.  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 Vergie Living      Interval History:  Last visit 01/06/23    She is still seeing double(?) She saw Dr. Antony Blackbird her eye exam ahosed suppressed uveitis-not active right now.    She saw Dr. Waunita Schooner 02/03/23-He is concerned about transient episode of amnesia and double vision-recommending MRI brain and LP.    Fevers less-occasional temperature 101.    No joint swelling.    She just received her 1st dose Remicade 01/23/23, 2nd dose of TNF inhibitor 4/1    Rutherford Nail is OK, no mouth sores or genital ulcers.         Labs Reviewed:  02/06/23  CRP wnl  ESR 24  CMP wnl  CBC WBC 10.96    Previous Labs:  11/25/22  Procalcitonin wnl  CMP wnl  CBC unremarkable      Previous Labs:  08/29/22  CBC platelets 350  BMP unremarkable  ESR wnl  CRP wnl      Imaging Reports Reviewed:  12/16/22  MRI Lumbar Spine:  IMPRESSION:         1.  No acute osseous or soft tissue abnormality.  2.  Normal MR appearance of the distal spinal cord, conus medullaris and  cauda equina.  3.  Rotatory lumbar levoscoliosis.  4.  Lumbar spondylosis and facet arthrosis without central stenosis or  nerve root compression. Please see full details and segmental analysis  above.    CT Chest Angio:  11/23/22  FINDINGS:      LINES/TUBES: None.     LUNGS: No  consolidation, edema or mass. There is linear atelectasis at the  lung bases.  PLEURA: No pleural effusions or pneumothorax.     HEART: Not enlarged.  MEDIASTINUM: No axillary, hilar or mediastinal lymphadenopathy.  PULMONARY ARTERIES: No acute pulmonary emboli.  AORTA: No aneurysm or dissection.     UPPER ABDOMEN: Hepatic steatosis. No acute abnormality.     BONES AND SOFT TISSUES: Unremarkable.     IMPRESSION:      No acute pulmonary embolism.    CT A/P  11/23/22:  FINDINGS:      LINES/TUBES: None.     LOWER THORAX: Bibasilar atelectasis.     LIVER/BILIARY TREE: The gallbladder is absent. There is hepatic steatosis.  No biliary dilation.  SPLEEN: No splenomegaly.  PANCREAS: No pancreatic mass or duct dilatation.     KIDNEYS/URETERS: No hydronephrosis, stones or solid mass lesions.  ADRENALS: No adrenal mass.  PELVIC ORGANS/BLADDER: No pelvic masses. The bladder is partially  distended.     PERITONEUM/RETROPERITONEUM: No free air or fluid.  LYMPH NODES: No lymphadenopathy.  VESSELS: No aortic aneurysm.     GI TRACT: No bowel wall thickening or dilation. Normal appendix.     BONES AND SOFT TISSUES: There are degenerative changes in the  spine. No  suspicious or destructive osseous lesion.     IMPRESSION:      No acute abnormality.    Previous Imaging:      09/19/22:  ECHO:  Left Ventricle    The left ventricle is normal in size. There is normal left ventricular  geometry. Left ventricular systolic function is normal with an ejection  fraction by Biplane Method of Discs of  60 %.  3D LVEF 58%. Left ventricular  segmental wall motion is normal. Left ventricular diastolic filling parameters  are consistent with Grade I diastolic dysfunction (impaired relaxation  pattern).  Right Ventricle    The right ventricular cavity size is normal in size. Normal right  ventricular systolic function.    08/27/22  MRI left Hip:  IMPRESSION:      1.Trace left hip joint effusion without significant synovitis. Tear of the  left  anterosuperior acetabular labrum. Otherwise unremarkable left hip  joint.     2.Mild to moderate left distal gluteus minimus tendinosis and mild to  moderate left greater trochanteric bursitis with bursal edema. There is  mild left proximal hamstring tendinosis.      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     4.There has been interval enlargement of a left adnexal cyst since pelvic  ultrasound 03/18/2022, now measuring 3.3 cm. This could be further  evaluated with follow-up pelvic ultrasound.     5.Unchanged 3.8 cm cyst adjacent to the vaginal cuff post hysterectomy  since multiple prior exams favoring a benign etiology.      Previous Labs:  07/25/22  ESR 37  CRP wnl  CMP ALt 39  CBC wnl      Imaging Reports Reviewed:  04/12/22  Cardiac MRI:  IMPRESSION:       1. No delayed gadolinium enhancement to suggest myocardial fibrosis or  scarring.  2. Normal right ventricular systolic function.  3. Qualitatively mild global left ventricular hypokinesis. Left ventricular  systolic function measures 32% and may be artifactually reduced secondary  to significant motion artifact.    Labs Reviewed:  04/19/22  Throat culture no B-hemolytic strep  POCT flu A/B  Abbot -      Previous Labs:  01/14/22  CMP wnl  CRP wnl  ViT D 31  ESR wnl  Lipid panel wnl  TSH wnl      08/18/21  CBC wnl  CMP wnl  ESR wnl  CRP wnl    Previous Labs:  05/25/21  Quant Gold-  HepbSAb wnl  HepbSAg-  HepCAb-  HIV-        Previous Labs:  02/23/21  ESR wnl  CRP wnl  CMP ALT 42, AST normal    CRP wnl  ESR wnl  HLAB51 positive  IgE, IgA, IGG, IgM all wnl  CBC wnl    12/03/20  DIAGNOSIS:          A. Lesion, ulnar wrist, excision:     -Fibroadipose tissue and vasculature          B. Synovial tissue, biopsy:     -Fibroadipose tissue and synovium (see comment)          Comment:     Morphologic features typical of rheumatoid arthritis or infection are     not identified.    12/03/20  Bacterial Cultures: No Growth  Fungal Cultures: NG at one  week  AFB: No growth    11/20/20  ESR 50  CRP wnl  IgG subclasses wnl  ANA comprehensive panel negative        Previous Labs:    [facility] Labs:  10/23/20  C-ANCA, P-ANCA, MPO, PR3 all negative  Mitochondrial Ab-  ANA IFA-  Scl70-  Centromere-  RNP-  SMith-  TPPA Ab-      05/18/20  Ferritin 291  CMP AST/A:T 59/70  CBC elevated lymphocytes    02/06/20  MPO-  PR3-  SSA-  aCL IgM 14  aCL IgG, IgA -  Lupus anticoagulant -  B2IgG, IgM -  ACE wnl  CCP-  RF-  Vit D 31  TSH wnl  ESR wnl  CK wnl  C4 wnl  C3 wnl  CRP wnl    07/2019  ANA Screen-  LDH wnl    07/23/2018 lymph node biopsy:DIAGNOSIS:          Lymph node, deep right neck, excisional biopsy:     -Lymph node with non-specific reactive changes   Wound, AFB and Fungal cultures negative at this time    BONE MARROW 05/03/2019      BONE MARROW, LEFT POSTERIOR ILIAC CREST, CORE BIOPSY, CLOT SECTION,    ASPIRATE SMEAR AND TOUCH PREP:        - NORMOCELLULAR BONE MARROW (approximate50-60%) WITH TRILINEAGE HEMATOPOIESIS,  NORMAL MYELOID TO ERYTHROID RATIO AND ADEQUATE NUMBERS OF    MEGAKARYOCYTES WIT H UNREMARKABLE MORPHOLOGY        - A FEW SMALL REACTIVE LYMPHOID AGGREGATES IDENTIFIED, AND MILDLY  INCREASED NUMBERS OF EOSINOPHILS        - PENDING CYTOGENETIC STUDIES OF BONE MARROW SAMPLE  - Remain pending as of 05/14/2019.        COMMENT:    The concurrent flow cytometry performed on the bone marrow aspirate  sample shows no evidence of immunophenotypic abnormalities      Imaging Reviewed:    08/16/20  MRI Brain:IMPRESSION:      1. No intracranial mass, hemorrhage, or hydrocephalus is detected.  2. There is normal pattern of enhancement of the brain parenchyma and  meningeal surfaces.    9/18 MRI Abdomen:  IMPRESSION:      1. Diffuse hepatic steatosis. No suspicious focal hepatic lesions.  2. No biliary ductal dilatation or choledocholithiasis.    02/03/20  MRI Pelvis:  IMPRESSION:         Cystic structure at the vaginal cuff, flank by both ovaries not  significantly changed in size  compared to 2019. No abnormal internal  enhancement. This favors a benign process such as a peritoneal inclusion  cyst or a paraovarian cyst.    12/14/19 MRI R Hand:  IMPRESSION:      1.  No suspicious soft tissue mass, osseous mass, or masslike  enhancement in the right hand or wrist to correlate with the diffuse FDG  uptake on recent PET/CT.  2.  There is mild enhancing synovitis in the right wrist joint without  active joint erosion or bony destruction.  3.  A small dorsal ganglion cyst overlying the scapholunate interval.    12/06/19  Full Body PET Scan:    Hypermetabolic soft tissue mass in palmar R hand surface at base of the second digit-stable pulmonary nodulke 1.3 cm left lower lobe-recommend follow up Chest CT 3 months         The following sections were reviewed this encounter by the provider:   Tobacco  Allergies  Meds  Problems  Med Hx  Surg Hx  Fam Hx  PMH/PSH:  Past Medical History:   Diagnosis Date    Anxiety     Arrhythmia     WPW ablation in 2005, PVC ablation 8/23    Behcet's disease     She sees Dr. Lauro Regulus    BMI 45.0-49.9, adult     Fever 11/09/2022    Headache     migraine    Hx of ovarian cyst     Lower back pain     Meningitis 2011    Viral // treated and resolved    Pain in wrist 11/09/2022    Post-operative nausea and vomiting     Scoliosis     moderate// used a Brace at a young age    Uveitis         Past Surgical History:   Procedure Laterality Date    ABLATION - PVC'S N/A 06/08/2022    Procedure: Ablation - PVC's;  Surgeon: Jaynie Bream, MD;  Location: [facility];  Service: Cardiovascular;  Laterality: N/A;  SAME DAY DISCHARGE  CARTO NO TEE    ARTHROTOMY, WRIST Right 12/03/2020    Procedure: RIGHT UPPER EXTREMITY SYNOVIAL BIOPSY;  Surgeon: Lenox Ahr, MD;  Location: [facility];  Service: Plastics;  Laterality: Right;    BIOPSY, LYMPH NODE N/A 07/23/2018    Procedure: BIOPSY, LYMPH NODE;  Surgeon: Neldon Labella, MD;  Location: [facility];  Service: ENT;   Laterality: N/A;  NECK DEEP LYMPH NODE BIOPSY    BONE MARROW BIOPSY  2020    led to behcet's dx    CARDIAC ABLATION  2005    [facility] for Wolff-Parkinson-White    CHOLECYSTECTOMY  2009    [facility] DIAGNOSTIC  2010    DILATION AND CURETTAGE OF UTERUS  2010    FINGER GANGLION CYST EXCISION  04/2018    GANGLION CYST EXCISION  2014    04/2018 cyst removed from index finger-[place]  2016    HYSTERECTOMY  2012    HYSTEROSCOPY  2011    indication: Abnormal bleeding    LAPAROSCOPIC, LYSIS, ADHESIONS N/A 11/09/2022    Procedure: LAPAROSCOPIC LYSIS OF ADHESIONS;  Surgeon: Lindell Noe, MD;  Location: [facility];  Service: Gynecology;  Laterality: N/A;    LAPAROSCOPIC, SALPINGECTOMY Left 11/09/2022    Procedure: LAPAROSCOPIC, SALPINGECTOMY LEFT;  Surgeon: Lindell Noe, MD;  Location: [facility];  Service: Gynecology;  Laterality: Left;    LAPAROSCOPY, DIAGNOSTIC N/A 11/09/2022    Procedure: LAPAROSCOPY, DIAGNOSTIC;  Surgeon: Lindell Noe, MD;  Location: [facility];  Service: Gynecology;  Laterality: N/A;  0000000    UMBILICAL HERNIA REPAIR  2016    UPPER GASTROINTESTINAL ENDOSCOPY  2009    WISDOM TOOTH EXTRACTION  1998        FH/SH:  Family History   Adopted: Yes       Social History     Tobacco Use    Smoking status: Never     Passive exposure: Never    Smokeless tobacco: Never   Vaping Use    Vaping status: Never Used   Substance Use Topics    Alcohol use: Never    Drug use: Never        Meds/ Allergies:  Outpatient Medications Marked as Taking for the 02/08/23 encounter (Office Visit) with Mliss Sax, MD   Medication Sig Dispense Refill    Azelastine-Fluticasone 137-50 MCG/ACT Suspension 1 spray by Nasal route 2 (  two) times daily      botulinum Toxin Type A (Botox) 200 units Recon Soln injection Inject 155 units IM every 12 weeks 1 each 3    busPIRone (BUSPAR) 5 MG tablet Take 1 tablet (5 mg) by mouth daily 90 tablet 3    clindamycin (CLEOCIN T) 1 %  lotion Apply topically every morning      colesevelam (WELCHOL) 625 MG tablet Take 2 tablets (1,250 mg) by mouth every morning 180 tablet 3    Dapsone 7.5 % Gel Apply topically every morning      dexAMETHasone (DECADRON) 0.1 % ophthalmic solution Place 1 drop into both eyes 3 (three) times daily      diazePAM (VALIUM) 5 MG tablet Take 45 min prior to mri. Take an extra tab if needed 2 tablet 0    EPINEPHrine (EPIPEN 2-PAK) 0.3 MG/0.3ML Solution Auto-injector injection Inject 0.3 mLs (0.3 mg) into the muscle once as needed (Anaphylaxis) 2 each 2    hydrOXYzine (ATARAX) 10 MG tablet Take 1 tablet (10 mg) by mouth nightly 90 tablet 3    inFLIXimab 100 MG injection Infuse into the vein      Jublia 10 % Solution Apply topically daily      lidocaine-prilocaine (EMLA) cream Apply topically as needed Dermatology procedures      ondansetron (ZOFRAN-ODT) 4 MG disintegrating tablet Take 1 tablet (4 mg) by mouth every 8 (eight) hours as needed for Nausea 30 tablet 2    Rimegepant Sulfate 75 MG Tablet Dispersible Take 1 tablet (75 mg) by mouth every other day For migraine prevention 16 tablet 11    rizatriptan (MAXALT) 10 MG tablet Take 1-2 tablets (10-20 mg total) by mouth once as needed for Migraine Max 3 days a week 36 tablet 3    valACYclovir (VALTREX) 1000 MG tablet Take 2 tabs at onset; repeat once in 12 hours.  Total 4 tabs per outbreak 12 tablet 3    vitamin B-12 1000 MCG tablet Take 1 tablet (1,000 mcg) by mouth daily 30 tablet 0    [DISCONTINUED] Apremilast (Otezla) 30 MG Tab Take 1 tablet (30 mg) by mouth 2 (two) times daily 180 tablet 1     Allergies   Allergen Reactions    Fruit Blend Flavor [Flavoring Agent] Anaphylaxis and Swelling     Tropical Fruit     Cyanoacrylate     Latex Hives, Swelling, Other (See Comments) and Drug-Induced Flushing    Adhesive [Wound Dressing Adhesive] Itching    Other Itching and Rash     Sutures Monocryl    Oxycodone Hallucinations          PHYSICAL EXAM  Vitals:    02/08/23 1443   BP:  137/77   Pulse: (!) 114         General: In No Apparent Distress, non toxic appearing, Appears Stated Age, Alert and Oriented  HEENT: Normocephalic, Atraumatic, Anicteric Sclerae, Pinna appear normal without erythema or edema  Cardiovascular: Normal S1, S2, no murmurs, rubs or gallops, regular heart rate  Pulmonary: No conversational dyspnea, breathing comfortably on room air, Clear to Auscultation Bilaterally, no wheezing, rhonchi or rales  Neurologic: CN 3-12 Intact, Moving all Extremities Equally, 5/5 Muscle Strength with Hand Grip, Shoulder Abduction/Adduction, Elbow Flexion/Extension, hip flexion, knee flexion/extension, ankle dorsiflexion/plantar flexion bilaterally, No cerebellar dysmetria, Ambulates Normally  Extremities: Warm, Well Perfused, No edema,   Dermatologic: No malar rash, no discoid lesions, No psoriasis plaques, No nail dystrophy    MSK:   There  is currently no information documented on the homunculus. Go to the Rheumatology activity and complete the homunculus joint exam.     DIP:No synovitis and full range of motion  PIP: No synovitis and full range of motion  MCP: No synovitis and full range of motion  Wrists: No synovitis and full range of motion  Elbows: No effusions, bursitis and full range of motion  Shoulders: Full Range of Motion  Knees: No effusions, No crepitus and Full Range of Motion  Ankles: No swelling, Full Range of Motion, mild swelling and tenderness left ankle  MTP: No synovial hypertrophy  Feet: No Pain with Tarsal Squeeze          LABS:  Lab Results   Component Value Date    WBC 10.96 (H) 02/06/2023    HGB 14.1 02/06/2023    HCT 41.9 02/06/2023    MCV 93.7 02/06/2023    PLT 353 (H) 02/06/2023      Lab Results   Component Value Date    CREAT 0.9 02/06/2023    BUN 10.0 02/06/2023    NA 139 02/06/2023    K 4.4 02/06/2023    CL 107 02/06/2023    CO2 22 02/06/2023      Lab Results   Component Value Date    ALT 37 02/06/2023    AST 28 02/06/2023    ALKPHOS 59 02/06/2023     BILITOTAL 0.5 02/06/2023     Lab Results   Component Value Date    ESR 24 (H) 02/06/2023    ESR 25 (H) 01/27/2023    ESR 28 (H) 01/23/2023    ESR 31 (H) 01/10/2023    ESR 18 08/27/2022      Lab Results   Component Value Date    CRP <0.1 02/06/2023    CRP <0.1 01/27/2023    CRP 0.1 01/23/2023    CRP <0.1 01/10/2023    CRP 0.1 08/27/2022      No results found for: "ANA"   Lab Results   Component Value Date    RHEUMFACTOR <15.0 02/06/2020      Lab Results   Component Value Date    CCP <15.6 02/06/2020     No results found for: "URICACID"        ASSESSMENT/PLAN:    This patient is a 42 y.o. year female with Past Medical History of Prior Cholecystectomy, Wolf-Parkinson White, Prior Hysterectomy, Fatty Liver Disease who returns for follow up of Behcet's Disease and seronegative rheumatoid arthritis.  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    Just transitioned to Remicade-on 5 mg/kg and will maintain on q8 week dosing. Continue otezla for mouth ulcers.    She is following with Neurology and will obtain MRI brain and LP to work up amnesia, seizure, double vision    She will eventually need baseline pulmonary evaluation and PFTs-prolonged recovery from pneumonia, rule out obstructive disease.    Follow up with ophthalmology    Foot and ankle Xrays for left mechnaical ankle pain.    Neck US for mild cervical LAD    She will see GI for diarrhea, abdominal pain, nausea.      1. Behcet's disease  - Apremilast (Otezla) 30 MG Tab; Take 1 tablet (30 mg) by mouth 2 (two) times daily  Dispense: 180 tablet; Refill: 1    2. Foot pain, bilateral  - XR Foot Left AP And Lateral; Future  - XR Foot Right AP And Lateral; Future  - XR Ankle  Left AP And Lateral; Future  - XR Ankle Right AP And Lateral; Future  - XR Foot Left AP And Lateral  - XR Foot Right AP And Lateral  - XR Ankle Left AP And Lateral  - XR Ankle Right AP And Lateral    3. Chronic ankle pain, bilateral  - XR Foot Left AP And Lateral; Future  - XR Foot Right AP And  Lateral; Future  - XR Ankle Left AP And Lateral; Future  - XR Ankle Right AP And Lateral; Future  - XR Foot Left AP And Lateral  - XR Foot Right AP And Lateral  - XR Ankle Left AP And Lateral  - XR Ankle Right AP And Lateral    4. Cervical lymphadenopathy  - [place] Head Neck Soft Tissue; Future    5. Sicca, unspecified type  - [place] Head Neck Soft Tissue; Future    6. Aphthous ulcer  - Apremilast (Otezla) 30 MG Tab; Take 1 tablet (30 mg) by mouth 2 (two) times daily  Dispense: 180 tablet; Refill: 1    7. Fatty liver disease, nonalcoholic  - Apremilast (Otezla) 30 MG Tab; Take 1 tablet (30 mg) by mouth 2 (two) times daily  Dispense: 180 tablet; Refill: 1    8. Seronegative rheumatoid arthritis  - Apremilast (Otezla) 30 MG Tab; Take 1 tablet (30 mg) by mouth 2 (two) times daily  Dispense: 180 tablet; Refill: 1    9. High risk medication use                           Total Time spent reviewing records, interviewing patient and coordinating plan of care: 31 min    FOLLOW UP:  Return in about 6 weeks (around 03/22/2023) for Follow Up, 6-8 weeks video OK.                                                                                  Tarry Kos, MD  Rheumatologist  [facility]  [address]  [place], [place] [address]  Phone: [phone]  Fax: [phone]

## 2023-02-08 NOTE — Patient Instructions (Signed)
Dear Hailey [name] ,     It was lovely to see you in clinic today.    Here are things I'd like you to do following today's visit:  Please schedule your neck ultrasound    We will discuss your results at your next appointment. Please set up that appointment on your way out. If there are any extraordinary abnormal results, you will hear from me or our staff here in the office.    Sincerely,    Tarry Kos, MD  Rheumatologist  [facility]  [address]  [place], [place] [address]  Phone: [phone]  Fax: [phone]

## 2023-02-10 ENCOUNTER — Encounter: Payer: Self-pay | Admitting: Anesthesiology

## 2023-02-10 ENCOUNTER — Ambulatory Visit
Admission: RE | Admit: 2023-02-10 | Discharge: 2023-02-10 | Disposition: A | Discharge status: Home / Self Care | Payer: BLUE CROSS/BLUE SHIELD | Source: Ambulatory Visit

## 2023-02-10 ENCOUNTER — Ambulatory Visit
Admission: RE | Admit: 2023-02-10 | Discharge: 2023-02-10 | Disposition: A | Discharge status: Home / Self Care | Payer: BLUE CROSS/BLUE SHIELD | Source: Ambulatory Visit | Attending: Internal Medicine | Admitting: Internal Medicine

## 2023-02-10 DIAGNOSIS — M79671 Pain in right foot: Secondary | ICD-10-CM | POA: Insufficient documentation

## 2023-02-10 DIAGNOSIS — G8929 Other chronic pain: Secondary | ICD-10-CM | POA: Insufficient documentation

## 2023-02-10 DIAGNOSIS — M25571 Pain in right ankle and joints of right foot: Secondary | ICD-10-CM | POA: Insufficient documentation

## 2023-02-10 DIAGNOSIS — M25572 Pain in left ankle and joints of left foot: Secondary | ICD-10-CM | POA: Insufficient documentation

## 2023-02-10 DIAGNOSIS — R590 Localized enlarged lymph nodes: Secondary | ICD-10-CM | POA: Insufficient documentation

## 2023-02-10 DIAGNOSIS — M3500 Sicca syndrome, unspecified: Secondary | ICD-10-CM | POA: Insufficient documentation

## 2023-02-10 DIAGNOSIS — M79672 Pain in left foot: Secondary | ICD-10-CM | POA: Insufficient documentation

## 2023-02-10 DIAGNOSIS — G971 Other reaction to spinal and lumbar puncture: Secondary | ICD-10-CM

## 2023-02-11 ENCOUNTER — Inpatient Hospital Stay: Payer: BLUE CROSS/BLUE SHIELD

## 2023-02-12 ENCOUNTER — Encounter: Payer: Self-pay | Admitting: Neurology

## 2023-02-14 ENCOUNTER — Encounter (HOSPITAL_BASED_OUTPATIENT_CLINIC_OR_DEPARTMENT_OTHER): Payer: Self-pay | Admitting: Physician Assistant

## 2023-02-14 ENCOUNTER — Ambulatory Visit (INDEPENDENT_AMBULATORY_CARE_PROVIDER_SITE_OTHER): Payer: BLUE CROSS/BLUE SHIELD | Admitting: Physician Assistant

## 2023-02-14 ENCOUNTER — Inpatient Hospital Stay: Payer: BLUE CROSS/BLUE SHIELD

## 2023-02-14 VITALS — BP 131/92 | HR 92 | Temp 99.0°F | Resp 20 | Ht 68.0 in | Wt 317.0 lb

## 2023-02-14 DIAGNOSIS — G43711 Chronic migraine without aura, intractable, with status migrainosus: Secondary | ICD-10-CM

## 2023-02-14 DIAGNOSIS — H209 Unspecified iridocyclitis: Secondary | ICD-10-CM

## 2023-02-14 DIAGNOSIS — M352 Behcet's disease: Secondary | ICD-10-CM

## 2023-02-14 DIAGNOSIS — H532 Diplopia: Secondary | ICD-10-CM

## 2023-02-14 DIAGNOSIS — R569 Unspecified convulsions: Secondary | ICD-10-CM

## 2023-02-14 DIAGNOSIS — H539 Unspecified visual disturbance: Secondary | ICD-10-CM

## 2023-02-14 MED ORDER — DIVALPROEX SODIUM 500 MG PO TBEC
500.0000 mg | DELAYED_RELEASE_TABLET | Freq: Two times a day (BID) | ORAL | 2 refills | Status: DC
Start: 2023-02-14 — End: 2023-05-05

## 2023-02-14 NOTE — Patient Instructions (Addendum)
Our plan:     -Will order a routine EEG for further evaluation of seizure activity, patient has now had two witnessed episodes.   -Will start trial of Depakote 500mg  BID for seizure prevention, migraine prevention, and mood stabilization. We had a discussion regarding options for AED including Keppra and patient wanted to avoid due to potential side effect of mood lability/worsening depression and anxiety because she has been experiencing worsening anxiety and tearfulness as of late. She will have a CMP checked on 03/05/22 prior to her next Remicade infusion so we can evaluate her liver function tests.   -Recommend she try combination of caffeine + naproxen for acute headache relief since caffeine has been helpful so far. We discussed that if Depakote is able to break her cycle of migraine, Maxalt may be more effective again when a new headache starts   -Patient will follow up with repeat MRI Brain WWO as ordered by Dr. Theodoro Grist as well as LP. Instructions for LP provided via AVS today      The patient should return for follow-up with Dr. Theodoro Grist as previously scheduled on 03/05/22      To schedule LP:  Call: [phone] to schedule lumbar puncture at an [facility]  location    Procedure requirements:  Procedures will be scheduled Monday through Friday  You must stop taking vitamins or supplements for a minimum of 12 hours before scheduled time  Stop taking aspirin containing products 5 days prior to the procedure  Stop taking NSAIDs 24-48 hours prior to the procedure  CBC, PT/INR results (blood work ordered by MD) will be needed within 30 days from date scheduled  Bring your medication list to your procedure    Two days before procedure:  Our nurses will call you to go over the requirements for the procedure, answer any questions and help ensure you have everything needed in place    Day of the procedure:  You should not have anything to eat or drink for 4 hours before your scheduled time  Arrive to appointment 30  minutes prior to scheduled time  Be sure that you have a ride home after the procedure    Drive to [address] [place], [place] [address]  You can park in the green garage  Walk into the building across from the garage, and say you are there for an imaging test.  On arrival, someone at the desk will greet you and direct you to the imaging location.  You will have a lumbar puncture performed under the guidance of a neuro-radiologist who will collect and send any specimens to the lab.  You will lay on your back for 1 hour after the procedure to avoid headache  You will get discharge instructions after the procedure  All lab results will be sent to the ordering MDs office    General Post-LP Instructions:  For 8 hours after you get home, rest quietly, lie flat on your back or sit in a chair. You may get up to go to the bathroom. Do not bend over.  You may take tylenol for pain is needed  For 2 days, do not take a bath sit in a hot tub, or go swimming. You may shower  For 3 days, do not lift anything that weighs more than 10 pounds and avoid strenuous activity         Today's Visit:      In today's visit, I reviewed your medications and records relating your health -  prior testing, blood work, reports of other health care providers present in your electronic medical record.     If you have pertinent records from any non-[place] doctors that you would like to review, please have them sent to [place] or bring to them to your next office visit.     A copy of today's visit will be sent to your referring doctor and/or primary care doctor, if you have one listed in our system.    Let me know if there are things we could have done better for your office visit.    Patient satisfaction survey:      If you receive a patient satisfaction survey, I would greatly appreciate it if you would complete it. We value your feedback.     Contact me online:      Patient Portal online - Please sign up for MyChart -- this is the best way to  communicate with your team here.  There is a messaging feature, where you can [place] messages anytime of day.  It is the best way to communicate with [place] and get test results, medication refills, or ask questions.     You can expect to get a response within 24-48 hours during weekdays.  If you do not receive a timely response, resend your request and inform us. My goal is for every question answered ever day.  Average response for a phone call maybe 3 days due to the volume we receive (which is why MyChart is preferred).    If you are having a medical emergency -- call 911, DO NOT SEND A MESSAGE THROUGH MYCHART.     Coupons for medication:      If you have any trouble affording your medications, check out www.goodrx.com for coupons and competitive prices in your area.  If you need further assistance, let us know so we can work with you and your insurance to make sure you get the best care.    Thank you for trusting me with your health.      Take care,      Helyn App, MPAS, PA-C  [facility]

## 2023-02-14 NOTE — Progress Notes (Signed)
[facility] Follow up Visit    Subjective:      HPI  The patient is a 42 y.o., female with PMHx of Behcet's, seronegative RA, WPW, chronic migraines, new onset anterior uveitis who presents for follow-up.    She had a recent MRI brain WWO and MRI Orbits which were unremarkable. She has repeat MRI Brain imaging ordered by Dr. Theodoro Grist due to concern for motion degraded examination.   She has an upcoming LP ordered as well that she has had difficulty scheduling.     She has had headaches that are atypical for her normal pattern. Her headaches are behind the left eye and the occipital region of the head. The normal location is throughout the entire left side of her head.   She states the headache is present at a low level all the time and will spike up in severity a couple times per day since mid February. It will also wake up her up in the nighttime. She tried maxalt which has not been helpful even though it typically reduces her headaches. Drinking caffeine and sitting in a dark room seems to take the edge off of the migraine. She does endorse associated sensitivity to light and occasional dizziness and nausea. She continues to have blurry vision especially of her peripheral vision which seems to be gradually improving.     She has now had two seizure episodes.   The first episode occurred 01/25/23 which was 2 days after her first Remicade infusion. An hour prior, she felt dizzy and took a hydroxyzine. After dinner she was watching TV and stopped mid sentence and had decreased responsiveness and staring which was witness by her husband. She was moving her lips without sound and touched her face. She felt disoriented afterwards. She had a spike in her headache immediately after.     The second episode occurred on 02/11/23.  She was at home watching golf on TV when she had another episode of decreased responsiveness and made a sound as though she was jolting awake immediately beforehand. This was also witness by her  husband. She denies urinary incontinence but thinks she may have bit her tongue because it felt sore afterwards.     These episodes both lasted about 30-45 seconds. She was confused after and returned back to baseline after 5 minutes. She and husband denied any body shaking or urinary incontinence.     She has a history of severe concussion around the year 2001-2002 during college after slipping on hitting the back of her head on a van door. After this event was when her migraines initially started.     Previous preventative medications:   -Botox  -Nurtec  -Emgality  -Topiramate  -Propranolol  -Buspar     Previous acute medications:  -Maxalt  -Reglan  -Imitrex  -Hydroxyzine    Patient was last seen on 12/06/22 by Dr. Thayer Jew for Botox injections and by Dr. Theodoro Grist on 02/03/23 for follow up.     From previous visit:    42 y.o. year-old female with clinically established Behcet's (arthralgia, sialadenitis /sicca, +HLAB51, +pathergy, PET-pos adenopathy) on Humira, otezla and prednisone. I saw her 2 years ago for migraines which were felt to be independent of Behcet's and ultimately have been well controlled (care of Dr Thayer Jew)  ForBehcet's she was maintained on Humira, otezla and prednisone taper, at 20mg  when she worsened.   In Feb 2024, she developed scleritis / anterior  uveitis and started on dexamethasone gtt by ophthalmology. This  caused worsening headaches, light sensitivity, etc. She also developed vertical diplopia at this time that has only slightly improved.   On 01/25/23 she developed an episode of brief speech arrest, unresponsiveness, post-ictal confusion for a few  minutes with amnesia of the episode. This was witnessed by husband.       The following portions of the patient's history were reviewed and updated as appropriate: allergies, current medications, past family history, past medical history, past social history, past surgical history, and problem list.    Review of Systems  As above.  Current Outpatient  Medications on File Prior to Visit   Medication Sig Dispense Refill    Apremilast (Otezla) 30 MG Tab Take 1 tablet (30 mg) by mouth 2 (two) times daily 180 tablet 1    Azelastine-Fluticasone 137-50 MCG/ACT Suspension 1 spray by Nasal route 2 (two) times daily      botulinum Toxin Type A (Botox) 200 units Recon Soln injection Inject 155 units IM every 12 weeks 1 each 3    busPIRone (BUSPAR) 5 MG tablet Take 1 tablet (5 mg) by mouth daily 90 tablet 3    clindamycin (CLEOCIN T) 1 % lotion Apply topically every morning      colesevelam (WELCHOL) 625 MG tablet Take 2 tablets (1,250 mg) by mouth every morning 180 tablet 3    Dapsone 7.5 % Gel Apply topically every morning      dexAMETHasone (DECADRON) 0.1 % ophthalmic solution Place 1 drop into both eyes 3 (three) times daily      diazePAM (VALIUM) 5 MG tablet Take 45 min prior to mri. Take an extra tab if needed 2 tablet 0    dicyclomine (BENTYL) 10 MG capsule Take 1 capsule (10 mg) by mouth 4 times daily - with meals and at bedtime for 20 doses (Patient taking differently: Take 1 capsule (10 mg) by mouth as needed) 20 capsule 0    EPINEPHrine (EPIPEN 2-PAK) 0.3 MG/0.3ML Solution Auto-injector injection Inject 0.3 mLs (0.3 mg) into the muscle once as needed (Anaphylaxis) 2 each 2    hydrOXYzine (ATARAX) 10 MG tablet Take 1 tablet (10 mg) by mouth nightly 90 tablet 3    inFLIXimab 100 MG injection Infuse into the vein      Jublia 10 % Solution Apply topically daily      lidocaine-prilocaine (EMLA) cream Apply topically as needed Dermatology procedures      Multiple Vitamin (MULTIVITAMIN PO) Take by mouth      ondansetron (ZOFRAN-ODT) 4 MG disintegrating tablet Take 1 tablet (4 mg) by mouth every 8 (eight) hours as needed for Nausea 30 tablet 2    Rimegepant Sulfate 75 MG Tablet Dispersible Take 1 tablet (75 mg) by mouth every other day For migraine prevention 16 tablet 11    rizatriptan (MAXALT) 10 MG tablet Take 1-2 tablets (10-20 mg total) by mouth once as needed for  Migraine Max 3 days a week 36 tablet 3    valACYclovir (VALTREX) 1000 MG tablet Take 2 tabs at onset; repeat once in 12 hours.  Total 4 tabs per outbreak 12 tablet 3    vitamin B-12 1000 MCG tablet Take 1 tablet (1,000 mcg) by mouth daily 30 tablet 0    VITAMIN D PO Take by mouth       No current facility-administered medications on file prior to visit.       Objective:     Vitals:    02/14/23 1109   BP: (!) 131/92   BP  Site: Left arm   Patient Position: Sitting   Cuff Size: Large   Pulse: 92   Resp: 20   Temp: 99 F (37.2 C)   TempSrc: Temporal   SpO2: 97%   Weight: 143.8 kg (317 lb)   Height: 1.727 m (5\' 8" )     Body mass index is 48.2 kg/m.    Mental Status: The patient is awake, alert, and oriented. Orlene Erm is appropriate. Speech is fluent, and the patient follows commands consistently.  Cranial Nerves: Cranial nerves II-XII grossly intact. Extraocular movements are intact, without nystagmus. Visual fields are intact to bedside confrontation but with decreased acuity in the periphery bilaterally. The patient's face is symmetric, and facial sensation is intact. Hearing intact to conversational speech. Tongue protrudes midline and palate elevates symmetrically.  Shoulder shrug symmetrically intact.  Motor Exam: The patient has full strength throughout the proximal and distal muscles of the arms and legs. Tone and bulk appear normal. There are no abnormal movements or tremor noted.   Sensation: Light touch is intact.   Coordination: Finger-to-nose-to-finger testing is intact without dysmetria but with some hesitation.   Station and Gait: Normal       Data Review:    MRI Brain W WO Contrast [[id number]] (Order [id number])  MRI Orbits Only W WO Contrast [[id number]] (Order [id number])  Status: Final result     Study Result    Narrative & Impression   HISTORY: Transient episode of aphasia and blurry vision.     COMPARISON: CT head 01/27/2023 and MR brain 04/29/2021.     TECHNIQUE: MRI of the brain and orbits performed  on a 3.0 Tesla scanner  without and with intravenous contrast.  Examination performed with  dedicated imaging through the orbits.     CONTRAST: gadoterate meglumine (CLARISCAN) 10 MMOL/20ML injection 20 mL     FINDINGS:      MRI Orbits:  The optic nerves are normal and symmetric in caliber without evidence of  abnormal edema or enhancement. Optic chiasm and optic tracts are  unremarkable. Sella, suprasellar cistern, and cavernous sinuses are within  normal limits.     The globes demonstrate normal signal intensity.  No intraorbital mass or  fat stranding is identified. The extraocular muscles and lacrimal glands  are normal and symmetric in size.       Thin cut imaging through the brainstem demonstrates no abnormality of the  brainstem or visualized cranial nerves.     MRI Brain:  No reduced diffusion to suggest acute infarct. No acute hemorrhage,  extra-axial fluid collection, or midline shift. No mass lesion or vasogenic  edema. No abnormal parenchymal enhancement.     Normal appearance of the sella. Cerebellar tonsils are appropriately  positioned. No acute hydrocephalus. Patent basal cisterns. Skull base flow  voids are intact.     No osseous abnormality. No significant paranasal sinus mucosal disease.  Trace right mastoid effusion.      IMPRESSION:         No acute intracranial or orbital abnormality.     Milan Waunita Schooner, MD  01/28/2023 7:57 AM       Results for orders placed or performed during the hospital encounter of 01/27/23   MRI Brain W WO Contrast    Narrative    HISTORY: Transient episode of aphasia and blurry vision.    COMPARISON: CT head 01/27/2023 and MR brain 04/29/2021.    TECHNIQUE: MRI of the brain and orbits performed on a 3.0 Tesla scanner  without and  with intravenous contrast.  Examination performed with  dedicated imaging through the orbits.    CONTRAST: gadoterate meglumine (CLARISCAN) 10 MMOL/20ML injection 20 mL    FINDINGS:     MRI Orbits:  The optic nerves are normal and  symmetric in caliber without evidence of  abnormal edema or enhancement. Optic chiasm and optic tracts are  unremarkable. Sella, suprasellar cistern, and cavernous sinuses are within  normal limits.    The globes demonstrate normal signal intensity.  No intraorbital mass or  fat stranding is identified. The extraocular muscles and lacrimal glands  are normal and symmetric in size.      Thin cut imaging through the brainstem demonstrates no abnormality of the  brainstem or visualized cranial nerves.    MRI Brain:  No reduced diffusion to suggest acute infarct. No acute hemorrhage,  extra-axial fluid collection, or midline shift. No mass lesion or vasogenic  edema. No abnormal parenchymal enhancement.    Normal appearance of the sella. Cerebellar tonsils are appropriately  positioned. No acute hydrocephalus. Patent basal cisterns. Skull base flow  voids are intact.    No osseous abnormality. No significant paranasal sinus mucosal disease.  Trace right mastoid effusion.       Impression    No acute intracranial or orbital abnormality.    Milan Waunita Schooner, MD  01/28/2023 7:57 AM   CT Head without Contrast    Narrative    HISTORY: Episode of aphasia 2 nights ago.  Unusual headache with a history  of migraines.     COMPARISON: MRI of the brain dated April 29, 2021 is available.    TECHNIQUE: CT of the head performed without intravenous contrast. The  following dose reduction techniques were utilized: automated exposure  control and/or adjustment of the mA and/or KV according to patient size,  and the use of an iterative reconstruction technique.    FINDINGS:  No intracranial mass, hemorrhage, or hydrocephalus is detected.   The basal cisterns are clear.    The calvarium and the skull base are intact. No calvarial fracture  or  skull base fracture is seen.  The soft tissues are unremarkable.       The mastoid air cells and the middle ear cavity are aerated bilaterally.     The paranasal sinuses are clear.  No fluid  is seen in the paranasal  sinuses.     The orbits are unremarkable.       Impression       1.No intracranial abnormality is seen.  2. Given the history and symptoms, follow-up MRI may be of benefit.    Theodoro Doing, MD  01/27/2023 2:29 PM     Assessment & Plan:      Diagnosis ICD-10-CM Associated Order   1. Seizure  R56.9 EEG awake or drowsy routine ([address])     divalproex, DR, delayed release (DEPAKOTE) 500 MG EC tablet      2. Intractable chronic migraine without aura and with status migrainosus  G43.711 divalproex, DR, delayed release (DEPAKOTE) 500 MG EC tablet      3. Behcet's disease  M35.2       4. 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 Anterior uveitis  H20.9       6. Vision changes  H53.9         -Will order a routine EEG for further evaluation of seizure activity, patient has now had two witnessed episodes.   -Will start trial of  Depakote 500mg  BID for seizure prevention, migraine prevention, and mood stabilization. We had a discussion regarding options for AED including Keppra and patient wanted to avoid due to potential side effect of mood lability/worsening depression and anxiety because she has been experiencing worsening anxiety and tearfulness as of late. She will have a CMP checked on 03/05/22 prior to her next Remicade infusion so we can evaluate her liver function tests.   -Recommend she try combination of caffeine + naproxen for acute headache relief since caffeine has been helpful so far. We discussed that if Depakote is able to break her cycle of migraine, Maxalt may be more effective again when a new headache starts   -Patient will follow up with repeat MRI Brain [facility] as ordered by Dr. Theodoro Grist as well as LP for evaluation of potential Neuro Behcet's. Instructions for LP provided via AVS today      The patient should return for follow-up with Dr. Theodoro Grist as previously scheduled on 03/05/22    Helyn App, Danny Lawless, PA-C  [facility]     Phone #: [phone]  Fax #:  [phone] [place])  [id number]  [place], [place]. [address]       Dr. Hamilton Capri was available in a supervisory capacity.

## 2023-02-16 ENCOUNTER — Inpatient Hospital Stay: Payer: BLUE CROSS/BLUE SHIELD | Admitting: Rehabilitative and Restorative Service Providers"

## 2023-02-20 ENCOUNTER — Telehealth (INDEPENDENT_AMBULATORY_CARE_PROVIDER_SITE_OTHER): Payer: BLUE CROSS/BLUE SHIELD | Admitting: Family Medicine

## 2023-02-20 ENCOUNTER — Encounter: Payer: Self-pay | Admitting: No Specialty

## 2023-02-20 ENCOUNTER — Ambulatory Visit: Payer: BLUE CROSS/BLUE SHIELD | Admitting: Family Nurse Practitioner

## 2023-02-20 ENCOUNTER — Ambulatory Visit
Admission: RE | Admit: 2023-02-20 | Discharge: 2023-02-20 | Disposition: A | Discharge status: Home / Self Care | Payer: BLUE CROSS/BLUE SHIELD | Source: Ambulatory Visit | Attending: Neurology | Admitting: Neurology

## 2023-02-20 DIAGNOSIS — M352 Behcet's disease: Secondary | ICD-10-CM | POA: Insufficient documentation

## 2023-02-20 DIAGNOSIS — R569 Unspecified convulsions: Secondary | ICD-10-CM

## 2023-02-20 DIAGNOSIS — H532 Diplopia: Secondary | ICD-10-CM

## 2023-02-20 DIAGNOSIS — H209 Unspecified iridocyclitis: Secondary | ICD-10-CM

## 2023-02-20 DIAGNOSIS — G43709 Chronic migraine without aura, not intractable, without status migrainosus: Secondary | ICD-10-CM

## 2023-02-20 DIAGNOSIS — H539 Unspecified visual disturbance: Secondary | ICD-10-CM

## 2023-02-20 LAB — CSF CELL COUNT TUBE #4
CSF RBC Count Tube #4: 9 /mm3 — ABNORMAL HIGH (ref 0–3)
CSF WBC Count Tube #4: 0 /mm3 (ref 0–5)

## 2023-02-20 LAB — CSF PROTEIN: CSF Protein: 23 mg/dL (ref 15.0–40.0)

## 2023-02-20 LAB — CSF GLUCOSE: CSF Glucose: 64 mg/dL (ref 40–70)

## 2023-02-20 MED ORDER — LIDOCAINE HCL 1 % IJ SOLN
5.0000 mL | Freq: Once | INTRAMUSCULAR | Status: AC
Start: 2023-02-20 — End: 2023-02-20
  Administered 2023-02-20: 5 mL via INTRADERMAL

## 2023-02-21 ENCOUNTER — Encounter (INDEPENDENT_AMBULATORY_CARE_PROVIDER_SITE_OTHER): Payer: Self-pay | Admitting: Family Medicine

## 2023-02-21 ENCOUNTER — Other Ambulatory Visit: Payer: Self-pay

## 2023-02-21 ENCOUNTER — Inpatient Hospital Stay: Payer: BLUE CROSS/BLUE SHIELD

## 2023-02-21 ENCOUNTER — Telehealth (INDEPENDENT_AMBULATORY_CARE_PROVIDER_SITE_OTHER): Payer: BLUE CROSS/BLUE SHIELD | Admitting: Family Medicine

## 2023-02-21 DIAGNOSIS — F419 Anxiety disorder, unspecified: Secondary | ICD-10-CM

## 2023-02-21 DIAGNOSIS — M352 Behcet's disease: Secondary | ICD-10-CM

## 2023-02-21 DIAGNOSIS — R569 Unspecified convulsions: Secondary | ICD-10-CM

## 2023-02-21 LAB — LAB USE ONLY - HISTORICAL NON-GYN MEDICAL CYTOLOGY

## 2023-02-21 MED ORDER — BUSPIRONE HCL 5 MG PO TABS
5.0000 mg | ORAL_TABLET | Freq: Two times a day (BID) | ORAL | 3 refills | Status: DC
Start: 2023-02-21 — End: 2024-10-02

## 2023-02-21 NOTE — Telephone Encounter (Signed)
Patient needs new Rx for appt 4/24, please advise    Thank you

## 2023-02-21 NOTE — Progress Notes (Signed)
[facility] [place]    Subjective:      Date: 02/21/2023 8:22 AM   Patient ID: Hailey [name] is a 42 y.o. female.    Verbal consent has been obtained from the patient to conduct a video and telephone visit: yes    Chief Complaint:  Chief Complaint   Patient presents with    Medication Refill    Anxiety       HPI:  Anxiety  Symptoms include nausea. Patient reports no nervous/anxious behavior.         Patient presents to follow up on her anxiety. Has been taking buspar 5mg  BID. Over the last few weeks feels like it has been working well.  Recently started on depakote for recent seizure. Tolerating well, no side effects. Feels like perhaps the mood stabilizing aspect of the depakote may be helping the buspar.  Pt had LP yesterday, having post-LP HA. F/u scheduled with neuro in 2 weeks to go over results.       Problem List:  Patient Active Problem List   Diagnosis    Morbid obesity    Herpes labialis    Non-seasonal allergic rhinitis, unspecified trigger    Migraine with aura and without status migrainosus, not intractable    Post-cholecystectomy syndrome    History of Wolff-Parkinson-White (WPW) syndrome    Scoliosis deformity of spine    Behcet's disease    Immunocompromised state due to drug therapy    S/P hysterectomy    Premature ventricular contractions (PVCs) (VPCs)    Palpitations    NICM (nonischemic cardiomyopathy)    PVC's (premature ventricular contractions)    Abdominal pain, unspecified abdominal location    Seronegative rheumatoid arthritis    Anterior uveitis    Generalized abdominal pain    Speech abnormality       Current Medications:  Current Outpatient Medications   Medication Sig Dispense Refill    Apremilast (Otezla) 30 MG Tab Take 1 tablet (30 mg) by mouth 2 (two) times daily 180 tablet 1    Azelastine-Fluticasone 137-50 MCG/ACT Suspension 1 spray by Nasal route 2 (two) times daily      botulinum Toxin Type A (Botox) 200 units Recon Soln injection Inject 155 units IM every 12  weeks 1 each 3    busPIRone (BUSPAR) 5 MG tablet Take 1 tablet (5 mg) by mouth 2 (two) times daily 180 tablet 3    clindamycin (CLEOCIN T) 1 % lotion Apply topically every morning      colesevelam (WELCHOL) 625 MG tablet Take 2 tablets (1,250 mg) by mouth every morning 180 tablet 3    Dapsone 7.5 % Gel Apply topically every morning      dexAMETHasone (DECADRON) 0.1 % ophthalmic solution Place 1 drop into both eyes 3 (three) times daily      diazePAM (VALIUM) 5 MG tablet Take 45 min prior to mri. Take an extra tab if needed 2 tablet 0    dicyclomine (BENTYL) 10 MG capsule Take 1 capsule (10 mg) by mouth 4 times daily - with meals and at bedtime for 20 doses (Patient taking differently: Take 1 capsule (10 mg) by mouth as needed) 20 capsule 0    divalproex, DR, delayed release (DEPAKOTE) 500 MG EC tablet Take 1 tablet (500 mg) by mouth 2 (two) times daily 60 tablet 2    EPINEPHrine (EPIPEN 2-PAK) 0.3 MG/0.3ML Solution Auto-injector injection Inject 0.3 mLs (0.3 mg) into the muscle once as needed (Anaphylaxis) 2 each 2  hydrOXYzine (ATARAX) 10 MG tablet Take 1 tablet (10 mg) by mouth nightly 90 tablet 3    inFLIXimab 100 MG injection Infuse into the vein      Jublia 10 % Solution Apply topically daily      lidocaine-prilocaine (EMLA) cream Apply topically as needed Dermatology procedures      Multiple Vitamin (MULTIVITAMIN PO) Take by mouth      ondansetron (ZOFRAN-ODT) 4 MG disintegrating tablet Take 1 tablet (4 mg) by mouth every 8 (eight) hours as needed for Nausea 30 tablet 2    Rimegepant Sulfate 75 MG Tablet Dispersible Take 1 tablet (75 mg) by mouth every other day For migraine prevention 16 tablet 11    rizatriptan (MAXALT) 10 MG tablet Take 1-2 tablets (10-20 mg total) by mouth once as needed for Migraine Max 3 days a week 36 tablet 3    valACYclovir (VALTREX) 1000 MG tablet Take 2 tabs at onset; repeat once in 12 hours.  Total 4 tabs per outbreak 12 tablet 3    vitamin B-12 1000 MCG tablet Take 1 tablet  (1,000 mcg) by mouth daily 30 tablet 0    VITAMIN D PO Take by mouth       No current facility-administered medications for this visit.       Allergies:  Allergies   Allergen Reactions    Fruit Blend Flavor [Flavoring Agent] Anaphylaxis and Swelling     Tropical Fruit     Cyanoacrylate     Latex Hives, Swelling, Other (See Comments) and Drug-Induced Flushing    Adhesive [Wound Dressing Adhesive] Itching    Other Itching and Rash     Sutures Monocryl    Oxycodone Hallucinations       Past Medical History:  Past Medical History:   Diagnosis Date    Anxiety     Arrhythmia     WPW ablation in 2005, PVC ablation 8/23    Behcet's disease     She sees Dr. Adin Hector    BMI 45.0-49.9, adult     Fever 11/09/2022    Headache     migraine    Hx of ovarian cyst     Lower back pain     Meningitis 2011    Viral // treated and resolved    Pain in wrist 11/09/2022    Post-operative nausea and vomiting     Scoliosis     moderate// used a Brace at a young age    Uveitis        Past Surgical History:  Past Surgical History:   Procedure Laterality Date    ABLATION - PVC'S N/A 06/08/2022    Procedure: Ablation - PVC's;  Surgeon: [name], MD;  Location: [facility];  Service: Cardiovascular;  Laterality: N/A;  SAME DAY DISCHARGE  CARTO NO TEE    ARTHROTOMY, WRIST Right 12/03/2020    Procedure: RIGHT UPPER EXTREMITY SYNOVIAL BIOPSY;  Surgeon: Kellie Simmering, MD;  Location: [facility];  Service: Plastics;  Laterality: Right;    BIOPSY, LYMPH NODE N/A 07/23/2018    Procedure: BIOPSY, LYMPH NODE;  Surgeon: Shelda Pal, MD;  Location: [facility];  Service: ENT;  Laterality: N/A;  NECK DEEP LYMPH NODE BIOPSY    BONE MARROW BIOPSY  2020    led to behcet's dx    CARDIAC ABLATION  2005    [facility] for Wolff-Parkinson-White    CHOLECYSTECTOMY  2009    [facility] DIAGNOSTIC  2010  DILATION AND CURETTAGE OF UTERUS  2010    FINGER GANGLION CYST EXCISION  04/2018    GANGLION CYST EXCISION  2014    04/2018 cyst  removed from index finger-[facility]    HERNIA REPAIR  2016    HYSTERECTOMY  2012    HYSTEROSCOPY  2011    indication: Abnormal bleeding    LAPAROSCOPIC, LYSIS, ADHESIONS N/A 11/09/2022    Procedure: LAPAROSCOPIC LYSIS OF ADHESIONS;  Surgeon: Devota Pace, MD;  Location: [facility];  Service: Gynecology;  Laterality: N/A;    LAPAROSCOPIC, SALPINGECTOMY Left 11/09/2022    Procedure: LAPAROSCOPIC, SALPINGECTOMY LEFT;  Surgeon: Devota Pace, MD;  Location: [facility];  Service: Gynecology;  Laterality: Left;    LAPAROSCOPY, DIAGNOSTIC N/A 11/09/2022    Procedure: LAPAROSCOPY, DIAGNOSTIC;  Surgeon: Devota Pace, MD;  Location: [place] [facility];  Service: Gynecology;  Laterality: N/A;  [id number]    UMBILICAL HERNIA REPAIR  2016    UPPER GASTROINTESTINAL ENDOSCOPY  2009    WISDOM TOOTH EXTRACTION  1998       Family History:  Family History   Adopted: Yes       Social History:  Social History     Socioeconomic History    Marital status: Married    Number of children: 0   Occupational History    Occupation: Pensions consultant    Tobacco Use    Smoking status: Never     Passive exposure: Never    Smokeless tobacco: Never   Vaping Use    Vaping status: Never Used   Substance and Sexual Activity    Alcohol use: Never    Drug use: Never    Sexual activity: Yes     Partners: Male     Birth control/protection: None     Comment: Hysterectomy   Social History Narrative    Diet: Non specific         Caffeine per day: tea 1 cup         ACD: N        Medical POA: N        DNR: N        Exercise: N     Social Determinants of Health     Financial Resource Strain: Low Risk  (12/05/2022)    Overall Financial Resource Strain (CARDIA)     Difficulty of Paying Living Expenses: Not hard at all   Food Insecurity: No Food Insecurity (01/27/2023)    Hunger Vital Sign     Worried About Running Out of Food in the Last Year: Never true     Ran Out of Food in the Last Year: Never true   Transportation Needs: No Transportation Needs  (12/05/2022)    PRAPARE - Therapist, art (Medical): No     Lack of Transportation (Non-Medical): No   Physical Activity: Unknown (12/05/2022)    Exercise Vital Sign     Days of Exercise per Week: Patient declined     Minutes of Exercise per Session: 10 min   Recent Concern: Physical Activity - Insufficiently Active (11/06/2022)    Exercise Vital Sign     Days of Exercise per Week: 1 day     Minutes of Exercise per Session: 10 min   Stress: Stress Concern Present (12/05/2022)    [employer] of Occupational Health - Occupational Stress Questionnaire     Feeling of Stress : To some extent   Social Connections: Moderately  Integrated (12/05/2022)    Social Connection and Isolation Panel [NHANES]     Frequency of Communication with Friends and Family: Twice a week     Frequency of Social Gatherings with Friends and Family: Once a week     Attends Religious Services: More than 4 times per year     Active Member of [employer] or Organizations: No     Attends Banker Meetings: Patient declined     Marital Status: Married   Catering manager Violence: Not At Risk (01/27/2023)    Humiliation, Afraid, Rape, and Kick questionnaire     Fear of Current or Ex-Partner: No     Emotionally Abused: No     Physically Abused: No     Sexually Abused: No   Housing Stability: Low Risk  (12/05/2022)    Housing Stability Vital Sign     Unable to Pay for Housing in the Last Year: No     Number of Places Lived in the Last Year: 1     Unstable Housing in the Last Year: No        The following sections were reviewed this encounter by the provider:   Tobacco  Allergies  Meds  Problems  Med Hx  Surg Hx  Fam Hx         ROS:  Review of Systems   Constitutional:  Negative for chills and fever.   Gastrointestinal:  Positive for nausea.   Neurological:  Positive for headaches.   Psychiatric/Behavioral:  The patient is not nervous/anxious.         Objective:     Physical Exam:  Physical Exam  Nursing note  reviewed.   Constitutional:       Appearance: Normal appearance.   HENT:      Mouth/Throat:      Mouth: Mucous membranes are moist.   Eyes:      Extraocular Movements: Extraocular movements intact.   Pulmonary:      Effort: Pulmonary effort is normal.   Neurological:      Mental Status: She is alert.   Psychiatric:         Mood and Affect: Mood normal.         Assessment/Plan:       1. Anxiety  -Well controlled on current medication. Continue current medication regimen.  - busPIRone (BUSPAR) 5 MG tablet; Take 1 tablet (5 mg) by mouth 2 (two) times daily  Dispense: 180 tablet; Refill: 3    2. Behcet's disease  - Managed by rheumatology    3. Seizure  - Managed by neurology, pending LP results    Return if symptoms worsen or fail to improve.    Virgilio Frees, MD

## 2023-02-22 ENCOUNTER — Telehealth: Payer: Self-pay

## 2023-02-22 ENCOUNTER — Telehealth: Payer: Self-pay | Admitting: Nurse Practitioner

## 2023-02-22 ENCOUNTER — Ambulatory Visit: Payer: BLUE CROSS/BLUE SHIELD | Admitting: No Specialty

## 2023-02-22 ENCOUNTER — Other Ambulatory Visit: Payer: Self-pay

## 2023-02-22 DIAGNOSIS — M352 Behcet's disease: Secondary | ICD-10-CM

## 2023-02-22 DIAGNOSIS — G43711 Chronic migraine without aura, intractable, with status migrainosus: Secondary | ICD-10-CM

## 2023-02-22 DIAGNOSIS — G8918 Other acute postprocedural pain: Secondary | ICD-10-CM

## 2023-02-22 LAB — CSF IGG SYNTHESIS RATE
Albumin: 4.7 g/dL (ref 3.6–5.1)
CSF Albumin: 12.1 mg/dL (ref 8.0–42.0)
CSF IgG Index: 0.57 (ref <0.70)
CSF IgG: 1.4 mg/dL (ref 0.8–7.7)
CSF Synthesis Rate: -2.3 mg/24 h (ref <=3.3)
Immunoglobulin G: 950 mg/dL (ref 600–1640)

## 2023-02-22 LAB — LAB USE ONLY - HISTORICAL NON-GYN MEDICAL CYTOLOGY

## 2023-02-22 MED ORDER — BOTOX 200 UNITS IJ SOLR
INTRAMUSCULAR | 3 refills | Status: DC
Start: 2023-02-22 — End: 2023-02-23

## 2023-02-22 NOTE — Telephone Encounter (Signed)
Spoke with pt regarding post LP headache. Pt had an LP to r/o MS on 4/15 at [facility]. Positional headache began that evening and worsened the next day to 8-9/10. That day she began taking Tylenol 1gm 2xD and increased fluids and caffeine (ice tea) as instructed, and obtained some relief. Headache is a throbbing sensation to the back of head and sometimes radiates to temples. When medicated and lying down, h/a deceases to 4/10, though increases when upright. Instructed pt to continue increasing fluids, caffeine and adding Excedrin and Advil (if able) to regimen and monitor h/a for the next 7-10 days. If h/a does not resolve or worsens, please call the anesthesia dept at [facility] to reassess need for blood patch. Pt aware of blood patch procedure.

## 2023-02-22 NOTE — Telephone Encounter (Signed)
Phone call placed to patient in response to MyChart message received.     Patient states that her headache/ nausea has been progressively worsening since the LP procedure was completed.     Has been lying flat, consuming caffeine and staying hydrated without improvement

## 2023-02-23 ENCOUNTER — Encounter (INDEPENDENT_AMBULATORY_CARE_PROVIDER_SITE_OTHER): Payer: Self-pay

## 2023-02-23 ENCOUNTER — Inpatient Hospital Stay: Payer: BLUE CROSS/BLUE SHIELD | Admitting: Rehabilitative and Restorative Service Providers"

## 2023-02-23 ENCOUNTER — Other Ambulatory Visit: Payer: Self-pay

## 2023-02-23 DIAGNOSIS — G43709 Chronic migraine without aura, not intractable, without status migrainosus: Secondary | ICD-10-CM

## 2023-02-23 DIAGNOSIS — G43711 Chronic migraine without aura, intractable, with status migrainosus: Secondary | ICD-10-CM

## 2023-02-24 ENCOUNTER — Ambulatory Visit: Payer: BLUE CROSS/BLUE SHIELD | Admitting: Anesthesiology

## 2023-02-24 ENCOUNTER — Ambulatory Visit
Admission: RE | Admit: 2023-02-24 | Discharge: 2023-02-24 | Disposition: A | Discharge status: Home / Self Care | Payer: BLUE CROSS/BLUE SHIELD | Source: Ambulatory Visit | Attending: Anesthesiology | Admitting: Anesthesiology

## 2023-02-24 ENCOUNTER — Encounter
Admission: RE | Disposition: A | Discharge status: Home / Self Care | Payer: Self-pay | Source: Ambulatory Visit | Attending: Anesthesiology

## 2023-02-24 ENCOUNTER — Other Ambulatory Visit: Payer: Self-pay | Admitting: Family Nurse Practitioner

## 2023-02-24 ENCOUNTER — Encounter: Payer: Self-pay | Admitting: Anesthesiology

## 2023-02-24 DIAGNOSIS — M352 Behcet's disease: Secondary | ICD-10-CM | POA: Insufficient documentation

## 2023-02-24 DIAGNOSIS — M0600 Rheumatoid arthritis without rheumatoid factor, unspecified site: Secondary | ICD-10-CM | POA: Insufficient documentation

## 2023-02-24 DIAGNOSIS — G43109 Migraine with aura, not intractable, without status migrainosus: Secondary | ICD-10-CM | POA: Insufficient documentation

## 2023-02-24 DIAGNOSIS — G971 Other reaction to spinal and lumbar puncture: Secondary | ICD-10-CM | POA: Insufficient documentation

## 2023-02-24 DIAGNOSIS — I493 Ventricular premature depolarization: Secondary | ICD-10-CM | POA: Insufficient documentation

## 2023-02-24 SURGERY — INJECTION, BLOOD/CLOT PATCH, EPIDURAL
Anesthesia: Local

## 2023-02-24 MED ORDER — LACTATED RINGERS IV SOLN
INTRAVENOUS | Status: DC
Start: 2023-02-24 — End: 2023-02-24

## 2023-02-24 MED ORDER — LIDOCAINE HCL (PF) 1 % IJ SOLN
INTRAMUSCULAR | Status: AC
Start: 2023-02-24 — End: ?
  Filled 2023-02-24: qty 5

## 2023-02-24 MED ORDER — ONDANSETRON HCL 4 MG/2ML IJ SOLN
4.0000 mg | Freq: Three times a day (TID) | INTRAMUSCULAR | Status: DC | PRN
Start: 2023-02-24 — End: 2023-02-24

## 2023-02-24 MED ORDER — ONDANSETRON 4 MG PO TBDP
4.0000 mg | ORAL_TABLET | Freq: Three times a day (TID) | ORAL | Status: DC | PRN
Start: 2023-02-24 — End: 2023-02-24
  Administered 2023-02-24: 4 mg via ORAL

## 2023-02-24 MED ORDER — ONDANSETRON 4 MG PO TBDP
ORAL_TABLET | ORAL | Status: AC
Start: 2023-02-24 — End: ?
  Filled 2023-02-24: qty 1

## 2023-02-24 MED ORDER — ONDANSETRON HCL 4 MG/2ML IJ SOLN
INTRAMUSCULAR | Status: AC
Start: 2023-02-24 — End: ?
  Filled 2023-02-24: qty 2

## 2023-02-24 MED ORDER — ACETAMINOPHEN 500 MG PO TABS
ORAL_TABLET | ORAL | Status: AC
Start: 2023-02-24 — End: ?
  Filled 2023-02-24: qty 2

## 2023-02-24 MED ORDER — EPHEDRINE SULFATE 50 MG/ML IJ/IV SOLN (WRAP)
Status: AC
Start: 2023-02-24 — End: ?
  Filled 2023-02-24: qty 1

## 2023-02-24 MED ORDER — BOTOX 200 UNITS IJ SOLR
INTRAMUSCULAR | 3 refills | Status: DC
Start: 2023-02-24 — End: 2023-02-24

## 2023-02-24 MED ORDER — ACETAMINOPHEN 500 MG PO TABS
1000.0000 mg | ORAL_TABLET | Freq: Once | ORAL | Status: AC
Start: 2023-02-24 — End: 2023-02-24
  Administered 2023-02-24: 1000 mg via ORAL

## 2023-02-24 NOTE — Telephone Encounter (Signed)
Botox prescription sent. 

## 2023-02-24 NOTE — Anesthesia Preprocedure Evaluation (Signed)
Anesthesia Evaluation    AIRWAY    Mallampati: III    TM distance: >3 FB  Neck ROM: full  Mouth Opening:full  Planned to use difficult airway equipment: No CARDIOVASCULAR    cardiovascular exam normal, regular and normal       DENTAL    no notable dental hx               PULMONARY    pulmonary exam normal and clear to auscultation     OTHER FINDINGS                                        Relevant Problems   NEURO/PSYCH   (+) Migraine with aura and without status migrainosus, not intractable   (+) Post-dural puncture headache      CARDIO   (+) Behcet's disease   (+) Migraine with aura and without status migrainosus, not intractable   (+) PVC's (premature ventricular contractions)   (+) Premature ventricular contractions (PVCs) (VPCs)      OTHER   (+) Seronegative rheumatoid arthritis               Anesthesia Plan    ASA 3     local                                 informed consent obtained      pertinent labs reviewed             Past Medical History:   Diagnosis Date    Anxiety     Arrhythmia     WPW ablation in 2005, PVC ablation 8/23    Behcet's disease     She sees Dr. Adin Hector    BMI 45.0-49.9, adult     Fever 11/09/2022    Headache     migraine    Hx of ovarian cyst     Lower back pain     Meningitis 2011    Viral // treated and resolved    Pain in wrist 11/09/2022    Post-operative nausea and vomiting     Scoliosis     moderate// used a Brace at a young age    Uveitis      Past Surgical History:   Procedure Laterality Date    ABLATION - PVC'S N/A 06/08/2022    Procedure: Ablation - PVC's;  Surgeon: [name], MD;  Location: [facility];  Service: Cardiovascular;  Laterality: N/A;  SAME DAY DISCHARGE  CARTO NO TEE    ARTHROTOMY, WRIST Right 12/03/2020    Procedure: RIGHT UPPER EXTREMITY SYNOVIAL BIOPSY;  Surgeon: Kellie Simmering, MD;  Location: [facility];  Service: Plastics;  Laterality: Right;    BIOPSY, LYMPH NODE N/A 07/23/2018    Procedure: BIOPSY, LYMPH NODE;  Surgeon: Shelda Pal, MD;  Location:  [facility];  Service: ENT;  Laterality: N/A;  NECK DEEP LYMPH NODE BIOPSY    BONE MARROW BIOPSY  2020    led to behcet's dx    CARDIAC ABLATION  2005    [facility] for Wolff-Parkinson-White    CHOLECYSTECTOMY  2009    DILATION AND CURETTAGE OF UTERUS  2010    FINGER GANGLION CYST EXCISION  04/2018    GANGLION CYST EXCISION  2014    04/2018 cyst removed from index  finger-[facility]    HYSTERECTOMY  2012    HYSTEROSCOPY  2011    indication: Abnormal bleeding    LAPAROSCOPIC, LYSIS, ADHESIONS N/A 11/09/2022    Procedure: LAPAROSCOPIC LYSIS OF ADHESIONS;  Surgeon: Devota Pace, MD;  Location: [facility];  Service: Gynecology;  Laterality: N/A;    LAPAROSCOPIC, SALPINGECTOMY Left 11/09/2022    Procedure: LAPAROSCOPIC, SALPINGECTOMY LEFT;  Surgeon: Devota Pace, MD;  Location: [facility] OR;  Service: Gynecology;  Laterality: Left;    LAPAROSCOPY, DIAGNOSTIC N/A 11/09/2022    Procedure: LAPAROSCOPY, DIAGNOSTIC;  Surgeon: Devota Pace, MD;  Location: [facility];  Service: Gynecology;  Laterality: N/A;  [id number]    UMBILICAL HERNIA REPAIR  2016    UPPER GASTROINTESTINAL ENDOSCOPY  2009    WISDOM TOOTH EXTRACTION  1998         LP 02/20/23 at [facility] to r/o MS  No s/s infection  No blood thinner use   Positional headache refractory to conservative measures    Signed by: Monico Blitz, MD 02/24/23 10:17 AM

## 2023-02-24 NOTE — Progress Notes (Signed)
[facility]  Follow up telephone call    Pt doing well; headache improved from 9-10/10 to 3-4/10.  No side effects.  Mild tolerable back cramping.  No numbness/weakness/paresthesias.    Red flag signs and contact info reviewed.  She will contact the dept of anesthesia if needed.     Antonietta Breach  Department of Anesthesiology  [facility]  [facility]     [facility] Contact numbers:  [facility] 8am-4pm and weekend mornings: x [phone] and [phone]  After hours for urgent issues: [phone]

## 2023-02-24 NOTE — Anesthesia Procedure Notes (Addendum)
Blood Patch:  Patient location during procedure: Pre-op  Reason for Blood Patch: spinal headache  Staffing:  Anesthesiologist: Monico Blitz, MD  Performed by: anesthesiologist   Preanesthetic Checklist:  Completed: patient identified, site marked, surgical consent, pre-op evaluation, timeout performed, IV checked, risks and benefits discussed, monitors and equipment checked and anesthesia consent given  Procedure Information:  Location of venous blood draw: arm  Autologous blood collection amount (mL): 25.  Patient position: sitting  Prep: Chloraprep  Patient monitoring: EKG, continuous pulse oximetry and blood pressure monitoring  Approach: midline  Injection technique: LOR saline  Location: L5-S1  Injection method: Touhy needle  Needle gauge: 18  Needle length: 10 cm  Catheter type: none    Assessment:  Events: well tolerated  Additional Notes:  Patient tolerated procedure very well w no complications.  Neuro exam at baseline.  D/C instructions reviewed.  Provided with contact information for concerns.  Will f/up with neurology and call us with any concerns related to the blood patch.

## 2023-02-24 NOTE — Discharge Instructions (Addendum)
[facility] Instructions for Epidural Blood Patch Patients    What is a spinal headache?   The spinal cord is surrounded by clear cerebro-spinal fluid within a sac called the Dura.  Occasionally, following puncture of the Dura, some of the fluid leaks out causing a post-dural puncture "spinal" headache.    What is a Blood Patch?  Blood from your arm was placed into the epidural (outside the Dura) space to clot and seal the leak.    What to expect:  mild back discomfort (bruising) for 2-3 days, some back spasm and neck stiffness is common for 1 -2 days, 90 - 95% chance of marked improvement.    Diet:  drink lots of water (64 - 128 oz./day), caffeinated liquids are recommended  unless you are nursing or have heart problems which preclude their use, resume normal diet as tolerated.    Activity:  resume normal activity, stand or walk as you able (laying down for 24 hours is not necessary), avoid straining or bearing down (as with constipation), or lifting more than a few pounds all of which may dislodge the clot.    Medications:  take analgesics like Tylenol, Advil (Ibuprofen), etc as needed, take stool softeners and laxatives if needed.    When to call us:  for recurrence of the headache particularly if it is made much worse with standing and nearly absent when lying down, for signs of infection such as fever (greater than 101 degrees Fahrenheit), redness, severe neck stiffness, increasing pain or swelling over the injection site on back, for new or progressive weakness in the legs, for inability to urinate.    How to Reach Us:  During business hours call [facility] office : [phone].  For nights and weekends call [facility] [phone] ask for the anesthesiologist on call.

## 2023-02-26 ENCOUNTER — Other Ambulatory Visit (INDEPENDENT_AMBULATORY_CARE_PROVIDER_SITE_OTHER): Payer: Self-pay | Admitting: Family Medicine

## 2023-02-26 DIAGNOSIS — R1084 Generalized abdominal pain: Secondary | ICD-10-CM

## 2023-02-27 ENCOUNTER — Telehealth (INDEPENDENT_AMBULATORY_CARE_PROVIDER_SITE_OTHER): Payer: Self-pay

## 2023-02-27 NOTE — Telephone Encounter (Signed)
PEC eval requested by Dr. Theodoro Grist via referral que on 4/17. Called placed to patient to schedule. Patient states the procedure was completed on 4/19.

## 2023-02-28 ENCOUNTER — Telehealth (INDEPENDENT_AMBULATORY_CARE_PROVIDER_SITE_OTHER): Payer: Self-pay

## 2023-02-28 NOTE — Telephone Encounter (Signed)
Per previous note on 4/22, PEC eval requested by Dr. Theodoro Grist via referral que on 4/17. Called placed to patient to schedule. Patient states the procedure was completed on 4/19.

## 2023-03-01 ENCOUNTER — Other Ambulatory Visit: Payer: Self-pay | Admitting: Neurology

## 2023-03-01 ENCOUNTER — Ambulatory Visit
Admission: RE | Admit: 2023-03-01 | Discharge: 2023-03-01 | Disposition: A | Discharge status: Home / Self Care | Payer: BLUE CROSS/BLUE SHIELD | Source: Ambulatory Visit | Attending: Neurology | Admitting: Neurology

## 2023-03-01 ENCOUNTER — Encounter: Payer: Self-pay | Admitting: Family Nurse Practitioner

## 2023-03-01 ENCOUNTER — Ambulatory Visit: Payer: BLUE CROSS/BLUE SHIELD | Attending: Family Nurse Practitioner | Admitting: Family Nurse Practitioner

## 2023-03-01 ENCOUNTER — Telehealth (INDEPENDENT_AMBULATORY_CARE_PROVIDER_SITE_OTHER): Payer: Self-pay

## 2023-03-01 VITALS — BP 149/99 | HR 110 | Resp 12 | Ht 68.0 in | Wt 315.0 lb

## 2023-03-01 DIAGNOSIS — H539 Unspecified visual disturbance: Secondary | ICD-10-CM

## 2023-03-01 DIAGNOSIS — G43711 Chronic migraine without aura, intractable, with status migrainosus: Secondary | ICD-10-CM

## 2023-03-01 DIAGNOSIS — R569 Unspecified convulsions: Secondary | ICD-10-CM | POA: Insufficient documentation

## 2023-03-01 DIAGNOSIS — H532 Diplopia: Secondary | ICD-10-CM | POA: Insufficient documentation

## 2023-03-01 DIAGNOSIS — H209 Unspecified iridocyclitis: Secondary | ICD-10-CM | POA: Insufficient documentation

## 2023-03-01 DIAGNOSIS — M352 Behcet's disease: Secondary | ICD-10-CM

## 2023-03-01 DIAGNOSIS — G43709 Chronic migraine without aura, not intractable, without status migrainosus: Secondary | ICD-10-CM | POA: Insufficient documentation

## 2023-03-01 MED ORDER — ELETRIPTAN HYDROBROMIDE 40 MG PO TABS
40.0000 mg | ORAL_TABLET | ORAL | 5 refills | Status: DC | PRN
Start: 2023-03-01 — End: 2024-01-01

## 2023-03-01 MED ORDER — GADOBUTROL 1 MMOL/ML IV SOSY (WRAP)
10.0000 mL | Freq: Once | INTRAVENOUS | Status: AC | PRN
Start: 2023-03-01 — End: 2023-03-01
  Administered 2023-03-01: 10 mL via INTRAVENOUS
  Filled 2023-03-01: qty 10

## 2023-03-01 MED ORDER — ONABOTULINUMTOXINA 200 UNITS IJ SOLR
155.0000 [IU] | Freq: Once | INTRAMUSCULAR | Status: AC
Start: 2023-03-01 — End: 2023-03-01
  Administered 2023-03-01: 155 [IU] via INTRAMUSCULAR

## 2023-03-01 NOTE — Progress Notes (Signed)
Botox 200 units witness by Armstead Leandra FNP  Botox 200 units mixed with 4 ml N/S  draw in each  1 ml syringe and 30 G. Needle order read back and verified to Armstead Leandra FNP

## 2023-03-01 NOTE — Patient Instructions (Addendum)
Our plan:     -You were injected with Botox today    -You may have pain and discomfort at the injection sites    -Please do not rub the injection sites    -Common side effects include headache pain, neck pain, or weakness    -If you have any side effects of concerns, please call the office and let me know    -Please track your headache days using a headache log and bring that information to your follow up visit    -Please return in 12 weeks for repeat Botox injection         -- 155 units of Botox administered today, patient tolerated procedure well  -- continue  Nurtec 75mg  every other day for additional migraine prevention  -- continue Depakote 500mg  BID for seizure prophylaxis, this will likely provided migraine prevention as well  -- for acute migraine trial of eletriptan as rizatriptan has not been effective  -- patient counseled on limiting triptans to less than 10 days/month   -- proceed with EEG on 4/25  -- patient to follow up with Dr. Theodoro Grist on 03/06/23  -- RTC in 12 weeks, sooner if needed      Discussed the importance of lifestyle modifications:  - sleep hygiene ( keep a regular sleep schedule) maintaining appropriate hydration,   - avoid overuse of caffeine,   - eat regular meals - minimizing processed foods and emphasizing lean proteins and fruits and vegetables.     --Keep headache diary, record frequency of headache, location, duration, intensity, preceding symptoms (aura occurrence), triggers and pain relieving measures.  -- recommend Migraine Buddy app     -Remember to limit acute headache treatment medicines (like triptans or anti-inflammatories) to 2-3 days/week to avoid medication-overuse headache.         For migraine education resources:  American Headache Society: https://americanheadachesociety.org  American Migraine Foundation: https://americanmigrainefoundation.org    Today's Visit:      In today's visit, I reviewed your medications and records relating your health - prior testing, blood work,  reports of other health care providers present in your electronic medical record.     If you have pertinent records from any non-[facility] doctors that you would like to review, please have them sent to [place] or bring to them to your next office visit.     A copy of today's visit will be sent to your referring doctor and/or primary care doctor, if you have one listed in our system.      Patient satisfaction survey:      If you receive a patient satisfaction survey, I would greatly appreciate it if you would complete it. We value your feedback.     Contact me online:      Patient Portal online - Please sign up for MyChart -- this is the best way to communicate with your team here.  There is a messaging feature, where you can [place] messages anytime of day.  It is the best way to communicate with [place] and get test results, medication refills, or ask questions.    If you are having a medical emergency -- call 911, DO NOT SEND A MESSAGE THROUGH MYCHART.     Coupons for medication:      If you have any trouble affording your medications, check out www.goodrx.com for coupons and competitive prices in your area.  If you need further assistance, let us know so we can work with you and your insurance to make sure you  get the best care.    Lowanda Foster FNP-C  [facility]   [facility]: [phone])- [phone]  [place]: [phone])- [phone]

## 2023-03-01 NOTE — Telephone Encounter (Signed)
Per previous note on 4/22, PEC eval requested by Dr. Dave via referral que on 4/17. Called placed to patient to schedule. Patient states the procedure was completed on 4/19.

## 2023-03-01 NOTE — Progress Notes (Signed)
03/01/2023      CC: migraine    History was obtained from patient   History of Present Illness:  Hailey [name] is a 42 y.o. 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     March 01 2023    Patient returns for repeat Botox injections  She was last seen in clinic by Dr. Thayer Jew in January   Today patient tells me in march she was admitted to [facility]  for aphasia   Per patient and chart review,  in March she was watching TV with her husband when she paused mid sentence and had a blank stare for a few moments. Patient reports a brief laps in memory during that time then quickly came back to and was able to reorient herself in conversation. A seizure was susspected  She was recommended to follow up outpatient for an EEG which she will be completing on 4/25  She had a second episode in April, and was started on depakote by PA Lynch     Per patient and chart review, Dr. Theodoro Grist thinks the seizures were possibly related  to neuro bachets.   She has had visual changes, uveitis, Dr. Theodoro Grist ordered an LP.  She had a LP done which caused a low pressure headache and received a blood patch on 4/19.   Pain was primarily in the occipital area, had a band like pain, pain improved when she was layed down  Low pressure Headaches improved within 48hrs  of the blood patch  She will be following up with Dr. Theodoro Grist on 4/29  She will have her 3rd infusion of remicade on monday    Patient had been doing well from a headache standpoint, her headaches have now changed some in character.   Current headaches are retro orbital, in the occipital area and is almost daily  Her typical migraines encompass her entire left head   She does continue to have migrainsous features, states her rizatriptan has not been effective. She has taken this for several years      09/12/22  Patient returns for her 3rd treatment for chronic migraine  In October she had about 2 migraines, previously at least 16 migraines prior to  Botox  Patient continues use Nurtec every other day for migraine prevention and rizatriptan for acute migraine treatment  She went to the ED in October for her left hip. She will be seeing ortho on Friday. The cause is unclear at this time, unlikely RA  She is taking ibuprofen  for the hip pain      06/22/22    Patient comes in today for her second Botox treatment for chronic migraine  Today patient tells me she is noted a decrease in the severity of her migraines but reports about the same frequency  Patient had some mild pathergy after last botox administration  She estimates her 30 day headache frequency/severity profile is about 10-15/several   2 weeks ago she had a  cardiac cath for an ablation due to frequent ventricular ectopy  Patient continues Nurtec QOD and is using rizatriptan as needed       Mar 23, 2022  She presents for Botox injections. No other issues or conerns.     Notes reviewed: Rheumatology message re: pathergy and Botox injections    Mar 15 2022  Headache hx: since college  Location:bilateral and band like and left eye  Described as sharp and throbbing  Occurs:>15 HA days per  month  Duration:>4 hours  Intensity: moderate to severe  Aura:none  Associated with photo/phonophobia, nausea and dizziness  Denies focal motor/sensory or visual changes.   No positional or autonomic symptoms  Exacerbating factors:unknown  Alleviating factors:unknown  Sleep:OK  Mood:high, working in coping mechanisms    Past medications tried:  Wellsite geologist  Rizatriptan  imitrex  buspar,   Topiramate: SE  reglan,   Propranolol ineffective  Hydroxyzine    Acute medications  Sumatriptan      [id number]    Records Reviewed:  Hospital records 01/28/23  PA Lynch progress note 02/14/23  Dr. Theodoro Grist progress note 02/03/23  Dr. Thayer Jew Progress notes      Past Medical History:  Past Medical History:   Diagnosis Date    Anxiety     Arrhythmia     WPW ablation in 2005, PVC ablation 8/23    Behcet's disease     She sees Dr. Adin Hector    BMI  45.0-49.9, adult     Fever 11/09/2022    Headache     migraine    Hx of ovarian cyst     Lower back pain     Meningitis 2011    Viral // treated and resolved    Pain in wrist 11/09/2022    Post-operative nausea and vomiting     Scoliosis     moderate// used a Brace at a young age    Uveitis        Past Surgical History:  Past Surgical History:   Procedure Laterality Date    ABLATION - PVC'S N/A 06/08/2022    Procedure: Ablation - PVC's;  Surgeon: [name], MD;  Location: [facility];  Service: Cardiovascular;  Laterality: N/A;  SAME DAY DISCHARGE  CARTO NO TEE    ARTHROTOMY, WRIST Right 12/03/2020    Procedure: RIGHT UPPER EXTREMITY SYNOVIAL BIOPSY;  Surgeon: Kellie Simmering, MD;  Location: [employer] [facility];  Service: Plastics;  Laterality: Right;    BIOPSY, LYMPH NODE N/A 07/23/2018    Procedure: BIOPSY, LYMPH NODE;  Surgeon: Shelda Pal, MD;  Location: [place] [facility];  Service: ENT;  Laterality: N/A;  NECK DEEP LYMPH NODE BIOPSY    BONE MARROW BIOPSY  2020    led to behcet's dx    CARDIAC ABLATION  2005    [facility] for Wolff-Parkinson-White    CHOLECYSTECTOMY  2009    DILATION AND CURETTAGE OF UTERUS  2010    FINGER GANGLION CYST EXCISION  04/2018    GANGLION CYST EXCISION  2014    04/2018 [facility]    HYSTERECTOMY  2012    HYSTEROSCOPY  2011    indication: Abnormal bleeding    LAPAROSCOPIC, LYSIS, ADHESIONS N/A 11/09/2022    Procedure: LAPAROSCOPIC LYSIS OF ADHESIONS;  Surgeon: Devota Pace, MD;  Location: [facility];  Service: Gynecology;  Laterality: N/A;    LAPAROSCOPIC, SALPINGECTOMY Left 11/09/2022    Procedure: LAPAROSCOPIC, SALPINGECTOMY LEFT;  Surgeon: Devota Pace, MD;  Location: [facility];  Service: Gynecology;  Laterality: Left;    LAPAROSCOPY, DIAGNOSTIC N/A 11/09/2022    Procedure: LAPAROSCOPY, DIAGNOSTIC;  Surgeon: Devota Pace, MD;  Location:  [facility];  Service: Gynecology;  Laterality: N/A;  [id number]     UMBILICAL HERNIA REPAIR  2016    UPPER GASTROINTESTINAL ENDOSCOPY  2009    WISDOM TOOTH EXTRACTION  1998       Allergies:  Fruit blend flavor [  flavoring agent], Cyanoacrylate, Latex, Adhesive [wound dressing adhesive], Other, and Oxycodone    Family History:  Family History   Adopted: Yes     No other family history related to current complaints.    Social History:  Social History     Tobacco Use    Smoking status: Never     Passive exposure: Never    Smokeless tobacco: Never   Vaping Use    Vaping status: Never Used   Substance Use Topics    Alcohol use: Never    Drug use: Never       Medications:  Current Outpatient Medications   Medication Sig Dispense Refill    Apremilast (Otezla) 30 MG Tab Take 1 tablet (30 mg) by mouth 2 (two) times daily 180 tablet 1    Azelastine-Fluticasone 137-50 MCG/ACT Suspension 1 spray by Nasal route 2 (two) times daily      botulinum toxin type A (Botox) 200 units injection Inject 155 units IM every 12 weeks 1 each 3    busPIRone (BUSPAR) 5 MG tablet Take 1 tablet (5 mg) by mouth 2 (two) times daily 180 tablet 3    clindamycin (CLEOCIN T) 1 % lotion Apply topically every morning      colesevelam (WELCHOL) 625 MG tablet Take 2 tablets (1,250 mg) by mouth every morning 180 tablet 3    Dapsone 7.5 % Gel Apply topically every morning      dexAMETHasone (DECADRON) 0.1 % ophthalmic solution Place 1 drop into both eyes 3 (three) times daily      diazePAM (VALIUM) 5 MG tablet Take 45 min prior to mri. Take an extra tab if needed 2 tablet 0    dicyclomine (BENTYL) 10 MG capsule Take 1 capsule (10 mg) by mouth 4 (four) times daily as needed (abdominal pain) 30 capsule 2    divalproex, DR, delayed release (DEPAKOTE) 500 MG EC tablet Take 1 tablet (500 mg) by mouth 2 (two) times daily 60 tablet 2    EPINEPHrine (EPIPEN 2-PAK) 0.3 MG/0.3ML Solution Auto-injector injection Inject 0.3 mLs (0.3 mg) into the muscle once as needed (Anaphylaxis) 2 each 2    hydrOXYzine (ATARAX) 10 MG tablet Take 1  tablet (10 mg) by mouth nightly 90 tablet 3    inFLIXimab 100 MG injection Infuse into the vein      Jublia 10 % Solution Apply topically daily      lidocaine-prilocaine (EMLA) cream Apply topically as needed Dermatology procedures      Multiple Vitamin (MULTIVITAMIN PO) Take by mouth      ondansetron (ZOFRAN-ODT) 4 MG disintegrating tablet Take 1 tablet (4 mg) by mouth every 8 (eight) hours as needed for Nausea 30 tablet 2    Rimegepant Sulfate 75 MG Tablet Dispersible Take 1 tablet (75 mg) by mouth every other day For migraine prevention 16 tablet 11    rizatriptan (MAXALT) 10 MG tablet Take 1-2 tablets (10-20 mg total) by mouth once as needed for Migraine Max 3 days a week 36 tablet 3    valACYclovir (VALTREX) 1000 MG tablet Take 2 tabs at onset; repeat once in 12 hours.  Total 4 tabs per outbreak 12 tablet 3    vitamin B-12 1000 MCG tablet Take 1 tablet (1,000 mcg) by mouth daily 30 tablet 0    VITAMIN D PO Take by mouth       No current facility-administered medications for this visit.       General Exam:  BP (!) 149/99 (BP Site:  Left arm, Patient Position: Sitting, Cuff Size: Medium)   Pulse (!) 110   Resp 12   Ht 1.727 m (5\' 8" )   Wt 142.9 kg (315 lb)   LMP  (LMP Unknown)   SpO2 94%   BMI 47.90 kg/m     PHYSICAL EXAM:    Gen: pleasant, NAD. Appears stated age.    HEENT: MMM, OP clear, sclera non-icteric    Respiratory: no respiratory distress; normal rhythm and effort.     MS: alert, appropriate, speech fluent, follows commands    CN: EOM intact, PERRL 3-->44mm, face symmetric, hearing intact to voice.     No lateralizing features. No pronator drift.    Gait normal    Investigations:  LABS    '  Chemistry        Component Value Date/Time    NA 139 02/06/2023 0754    K 4.4 02/06/2023 0754    CL 107 02/06/2023 0754    CO2 22 02/06/2023 0754    BUN 10.0 02/06/2023 0754    CREAT 0.9 02/06/2023 0754    CREAT 0.72 07/25/2022 1133    GLU 109 (H) 02/06/2023 0754        Component Value Date/Time    CA 9.6  02/06/2023 0754    ALKPHOS 59 02/06/2023 0754    AST 28 02/06/2023 0754    ALT 37 02/06/2023 0754    BILITOTAL 0.5 02/06/2023 0754        Lab Results   Component Value Date    WBC 10.96 (H) 02/06/2023    HGB 14.1 02/06/2023    HCT 41.9 02/06/2023    MCV 93.7 02/06/2023    PLT 353 (H) 02/06/2023         Imaging:    MRI Brain W WO Contrast    Result Date: 01/28/2023  Impression:  No acute intracranial or orbital abnormality. Milan Waunita Schooner, MD 01/28/2023 7:57 AM    CT Head without Contrast    Result Date: 01/27/2023  Impression:   1.No intracranial abnormality is seen. 2. Given the history and symptoms, follow-up MRI may be of benefit. Theodoro Doing, MD 01/27/2023 2:29 PM    MRI Lumbar Spine WO Contrast    Result Date: 12/19/2022  Impression:  1.  No acute osseous or soft tissue abnormality. 2.  Normal MR appearance of the distal spinal cord, conus medullaris and cauda equina. 3.  Rotatory lumbar levoscoliosis. 4.  Lumbar spondylosis and facet arthrosis without central stenosis or nerve root compression. Please see full details and segmental analysis above. Electronically signed by: Waynard Edwards M.D. [facility] CM: 12/19/22         MRI Brain W WO Contrast    Result Date: 04/30/2021  Impression:  1. No intracranial abnormality is detected. MR imaging of the brain is stable with 08/13/2020. 2. There is no lesion within the spinal cord. 3. Mild cervical, thoracic and lumbar spondylosis as discussed above. Electronically signed by: Otho Ket D.O.  [Interpreted at: [facility] BG: 04/30/21    MRI Cervical Spine W WO Contrast    Result Date: 04/30/2021  Impression:  1. No intracranial abnormality is detected. MR imaging of the brain is stable with 08/13/2020. 2. There is no lesion within the spinal cord. 3. Mild cervical, thoracic and lumbar spondylosis as discussed above. Electronically signed by: Otho Ket D.O.  [Interpreted at: [facility] BG: 04/30/21    MRI Lumbar Spine W WO  Contrast    Result Date: 04/30/2021  Impression:  1. No intracranial abnormality is detected. MR imaging of the brain is stable with 08/13/2020. 2. There is no lesion within the spinal cord. 3. Mild cervical, thoracic and lumbar spondylosis as discussed above. Electronically signed by: Otho Ket D.O.  [Interpreted at: [facility] BG: 04/30/21    MRI Thoracic Spine W WO Contrast    Result Date: 04/30/2021  Impression:  1. No intracranial abnormality is detected. MR imaging of the brain is stable with 08/13/2020. 2. There is no lesion within the spinal cord. 3. Mild cervical, thoracic and lumbar spondylosis as discussed above. Electronically signed by: Otho Ket D.O.  [Interpreted at: [facility] BG: 04/30/21        Procedure Note:  BOTOX injection is indicated for the prophylaxis of headaches in adult patients with chronic migraine.  Patient meets indications for BOTOX therapy.   Potential risks and benefits were reviewed.  Side effects including, but not limited to, potential systemic allergic reactions of the anaphylactic type as well as local injection site reactions of blepharoptosis, diplopia, infection, bleeding, pain, redness and bruising were reviewed.  The potential for headaches and/or neck pain post procedure were reviewed.   The patient's questions were answered.  The patient signed a consent form.  Patient understands that depending on their insurance carrier, there may be a copay for this treatment.   BOTOX was reconstituted using 200 units diluted with 4 mL of sterile saline.  BOTOX was injected as per the PREEMPT trial injection paradigm with dose administered as 5 unit intramuscular (IM) injections per site using a sterile, 30-gauge 0.5 inch needle as follows:     Muscle    Dose, # of Sites   Corrugator   10 units divided in 2 sites     Procerus   5 units in 1  site     Frontalis   20 units divided in 4 sites     Temporalis   40 units divided in 8 sites     Occipitalis   30 units divided in 6 sites     Cervical paraspinal  20 units divided in 4 sites     Trapezius   30 units divided in 6 sites    Each site was cleaned with alcohol prior to injection.  A total dose of 155 units were injected.  45 units were discarded/wasted. The patient tolerated the procedure well with no immediate complications.     Medication Info:  [facility] [id number]  Lot # [id number]  Exp 04/2025      Impression:    Hailey [name] is a 42 y.o. 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  She has headaches >15 days per month and at least 8 of these are migrainous.  Migraines last >4 hours if untreated.  This pattern has continued for >3 months.  She has failed at least two preventive medications.      -- patient has had a greater than 50% reduction in overall headache burden up until the last 2 months  -- patients co morbidities may be contributing to overall headache burden. Headaches have changed in character some, but still have migrainosus features.   -- severe headache after LP has resolved     Plan/ Recommendations:    1. Intractable chronic migraine  without aura and with status migrainosus  - eletriptan (RELPAX) 40 MG tablet; Take 1 tablet (40 mg) by mouth as needed for Migraine .  Wait at least 2 hours prior to taking second dose.  Max 2 tabs/24 hours. Do not use more than 3 days per week.  Dispense: 12 tablet; Refill: 5  - botulinum toxin type A (BOTOX) injection 155 Units    2. Chronic migraine without aura without status migrainosus, not intractable  - eletriptan (RELPAX) 40 MG tablet; Take 1 tablet (40 mg) by mouth as needed for Migraine .  Wait at least 2 hours prior to taking second dose.  Max 2 tabs/24 hours. Do not use more than 3 days per week.   Dispense: 12 tablet; Refill: 5  - botulinum toxin type A (BOTOX) injection 155 Units    3. Behcet's disease    4. Seizure    5. Vision changes        -- 155 units of Botox administered today, patient tolerated procedure well  -- continue  Nurtec 75mg  every other day for additional migraine prevention  -- continue Depakote 500mg  BID for seizure prophylaxis, this will likely provided migraine prevention as well  -- for acute migraine trial of eletriptan as rizatriptan has not been effective  -- patient counseled on limiting triptans to less than 10 days/month   -- proceed with EEG on 4/25  -- patient to follow up with Dr. Theodoro Grist on 03/06/23  -- RTC in 12 weeks, sooner if needed    Discussed the importance of lifestyle modifications:  - sleep hygiene ( keep a regular sleep schedule) maintaining appropriate hydration,   - avoid overuse of caffeine,   - eat regular meals - minimizing processed foods and emphasizing lean proteins and fruits and vegetables.     --Keep headache diary, record frequency of headache, location, duration, intensity, preceding symptoms (aura occurrence), triggers and pain relieving measures.  -- recommend Migraine Buddy app     -Remember to limit acute headache treatment medicines (like triptans or anti-inflammatories) to 2-3 days/week to avoid medication-overuse headache.      The patient should seek emergent care if there is any change in the symptoms. Proper use and all side effects of medications discussed      Please contact me with any questions. Patients and [facility] Providers can reach me via MyChart.      A total of 37 minutes was spent on this visit outside of the procedure  reviewing previous notes, counseling the patient and/or family members on the patient's headache disorder, medications, and expected outcomes; ordering of tests, adjusting meds and documenting the findings in the note.      Dr. Fraser Din was available in a supervisory role    Lowanda Foster FNP-C  [facility]  Neurology   [facility]: [phone])- [phone]  [place]: [phone]

## 2023-03-02 ENCOUNTER — Ambulatory Visit
Payer: BLUE CROSS/BLUE SHIELD | Attending: Physician Assistant | Admitting: Student in an Organized Health Care Education/Training Program

## 2023-03-02 ENCOUNTER — Telehealth (INDEPENDENT_AMBULATORY_CARE_PROVIDER_SITE_OTHER): Payer: Self-pay

## 2023-03-02 DIAGNOSIS — R569 Unspecified convulsions: Secondary | ICD-10-CM | POA: Insufficient documentation

## 2023-03-02 NOTE — Telephone Encounter (Signed)
Per previous note on 4/22, PEC eval requested by Dr. Dave via referral que on 4/17. Called placed to patient to schedule. Patient states the procedure was completed on 4/19.

## 2023-03-02 NOTE — Progress Notes (Signed)
Routine EEG completed as ordered. Awake, drowsy and asleep.    Report to follow.    Hailey [name], R. EEG T.

## 2023-03-03 ENCOUNTER — Ambulatory Visit (INDEPENDENT_AMBULATORY_CARE_PROVIDER_SITE_OTHER): Payer: BLUE CROSS/BLUE SHIELD | Admitting: Obstetrics and Gynecology

## 2023-03-03 ENCOUNTER — Encounter (INDEPENDENT_AMBULATORY_CARE_PROVIDER_SITE_OTHER): Payer: Self-pay | Admitting: Obstetrics and Gynecology

## 2023-03-03 VITALS — BP 138/87 | Ht 68.0 in | Wt 315.0 lb

## 2023-03-03 DIAGNOSIS — Z1151 Encounter for screening for human papillomavirus (HPV): Secondary | ICD-10-CM

## 2023-03-03 DIAGNOSIS — Z01419 Encounter for gynecological examination (general) (routine) without abnormal findings: Secondary | ICD-10-CM

## 2023-03-03 DIAGNOSIS — Z1231 Encounter for screening mammogram for malignant neoplasm of breast: Secondary | ICD-10-CM

## 2023-03-03 DIAGNOSIS — Z1272 Encounter for screening for malignant neoplasm of vagina: Secondary | ICD-10-CM

## 2023-03-03 DIAGNOSIS — R232 Flushing: Secondary | ICD-10-CM

## 2023-03-03 LAB — ZZZTHIN PREP-PAP W/IMAGING & HPV DNA (HIGH RISK W/16/18)
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
## 2023-03-05 NOTE — Procedures (Signed)
Patients name: Hailey [name]   Patients date of birth: 1980-12-17       ROUTINE EEG REPORT    Date of study: 03/02/2023    REASON FOR STUDY: Evaluate for seizure    HISTORY: Patient is a 42 y.o. female with history of witnessed seizure like activity x2 with subsequent decreased responsive obtaining EEG for further evaluation.     INTRODUCTION: A Routine EEG was performed using the standard international 10/20 Electrode Placement system with the following additional electrode(s):EKG     STAGES: Awake, Stage 1 sleep, Stage 2 sleep    ACTIVATION PROCEDURES: Photic Stimulation  MEDICATIONS: depakote 500 mg BID   TECHNICAL PROBLEMS: Muscle Artifact, Movement Artifact    DESCRIPTION OF THE RECORD:    POSTERIOR DOMINANT RHYTHM (ALPHA RHYTHM): 9-10 Hz   Symmetric, reactive to eye opening bilaterally    BETA: 20 Hz 10 uV SYMMETRY: symmetric   LOCATION: frontocentral  AMOUNT: small    SLEEP:  Stage 1 sleep normal  Stage 2 sleep normal    HYPERVENTILATION: Completed with one instance of left centrotemporal slowing  PHOTIC STIMULATION: Normal. No photoparoxysmal response noted.    ABNORMAL DISCHARGES:   Rare to occasional left temporal slowing (T5 predominant)  Sharply contoured waveforms seen during drowsiness and early sleep, most notable in left temporal leads which are likely consistent with normal variant known as wickets    PRIOR EEGs:  No prior EEGs to review    IMPRESSION:  Abnormal EEG: awake and asleep     CLINICAL CORRELATION:  This EEG is abnormal due to:  Left temporal slowing which may be suggestive of increased risk of seizures in this region  No clinical or electrographic seizures are seen during this recording    Illa Level, MD   [facility]  Board certified in Adult Neurology and Clinical Neurophysiology

## 2023-03-06 ENCOUNTER — Other Ambulatory Visit: Payer: BLUE CROSS/BLUE SHIELD

## 2023-03-06 ENCOUNTER — Encounter: Payer: Self-pay | Admitting: Neurology

## 2023-03-06 ENCOUNTER — Ambulatory Visit: Payer: BLUE CROSS/BLUE SHIELD

## 2023-03-06 ENCOUNTER — Ambulatory Visit: Payer: BLUE CROSS/BLUE SHIELD | Attending: Neurology | Admitting: Neurology

## 2023-03-06 VITALS — BP 132/90 | HR 97 | Temp 98.4°F | Ht 68.0 in

## 2023-03-06 VITALS — BP 141/86 | HR 83 | Temp 98.4°F | Wt 340.0 lb

## 2023-03-06 DIAGNOSIS — H209 Unspecified iridocyclitis: Secondary | ICD-10-CM

## 2023-03-06 DIAGNOSIS — M0600 Rheumatoid arthritis without rheumatoid factor, unspecified site: Secondary | ICD-10-CM

## 2023-03-06 DIAGNOSIS — R569 Unspecified convulsions: Secondary | ICD-10-CM

## 2023-03-06 DIAGNOSIS — Z5181 Encounter for therapeutic drug level monitoring: Secondary | ICD-10-CM

## 2023-03-06 DIAGNOSIS — M352 Behcet's disease: Secondary | ICD-10-CM

## 2023-03-06 DIAGNOSIS — R232 Flushing: Secondary | ICD-10-CM | POA: Insufficient documentation

## 2023-03-06 DIAGNOSIS — G43709 Chronic migraine without aura, not intractable, without status migrainosus: Secondary | ICD-10-CM

## 2023-03-06 DIAGNOSIS — G43711 Chronic migraine without aura, intractable, with status migrainosus: Secondary | ICD-10-CM

## 2023-03-06 LAB — COMPREHENSIVE METABOLIC PANEL
ALT: 39 U/L (ref 0–55)
AST (SGOT): 30 U/L (ref 5–41)
Albumin/Globulin Ratio: 1 (ref 0.9–2.2)
Albumin: 3.9 g/dL (ref 3.5–5.0)
Alkaline Phosphatase: 60 U/L (ref 37–117)
Anion Gap: 6 (ref 5.0–15.0)
BUN: 8 mg/dL (ref 7.0–21.0)
Bilirubin, Total: 0.3 mg/dL (ref 0.2–1.2)
CO2: 25 mEq/L (ref 17–29)
Calcium: 9.6 mg/dL (ref 8.5–10.5)
Chloride: 108 mEq/L (ref 99–111)
Creatinine: 0.7 mg/dL (ref 0.4–1.0)
Globulin: 3.8 g/dL — ABNORMAL HIGH (ref 2.0–3.6)
Glucose: 105 mg/dL — ABNORMAL HIGH (ref 70–100)
Potassium: 4 mEq/L (ref 3.5–5.3)
Protein, Total: 7.7 g/dL (ref 6.0–8.3)
Sodium: 139 mEq/L (ref 135–145)
eGFR: >60 mL/min/{1.73_m2} (ref >=60)

## 2023-03-06 LAB — SEDIMENTATION RATE: Sed Rate: 35 mm/Hr — ABNORMAL HIGH (ref 0–20)

## 2023-03-06 LAB — CBC AND DIFFERENTIAL
Absolute NRBC: 0 10*3/uL (ref 0.00–0.00)
Basophils Absolute Automated: 0.04 10*3/uL (ref 0.00–0.08)
Basophils Automated: 0.4 %
Eosinophils Absolute Automated: 0.09 10*3/uL (ref 0.00–0.44)
Eosinophils Automated: 0.9 %
Hematocrit: 40.3 % (ref 34.7–43.7)
Hgb: 13.7 g/dL (ref 11.4–14.8)
Immature Granulocytes Absolute: 0.03 10*3/uL (ref 0.00–0.07)
Immature Granulocytes: 0.3 %
Instrument Absolute Neutrophil Count: 5.31 10*3/uL (ref 1.10–6.33)
Lymphocytes Absolute Automated: 3.42 10*3/uL — ABNORMAL HIGH (ref 0.42–3.22)
Lymphocytes Automated: 35.1 %
MCH: 31.4 pg (ref 25.1–33.5)
MCHC: 34 g/dL (ref 31.5–35.8)
MCV: 92.2 fL (ref 78.0–96.0)
MPV: 8.3 fL — ABNORMAL LOW (ref 8.9–12.5)
Monocytes Absolute Automated: 0.84 10*3/uL (ref 0.21–0.85)
Monocytes: 8.6 %
Neutrophils Absolute: 5.31 10*3/uL (ref 1.10–6.33)
Neutrophils: 54.7 %
Nucleated RBC: 0 /100 WBC (ref 0.0–0.0)
Platelets: 360 10*3/uL — ABNORMAL HIGH (ref 142–346)
RBC: 4.37 10*6/uL (ref 3.90–5.10)
RDW: 13 % (ref 11–15)
WBC: 9.73 10*3/uL — ABNORMAL HIGH (ref 3.10–9.50)

## 2023-03-06 LAB — ESTRADIOL: Estradiol: 78 pg/mL

## 2023-03-06 LAB — HEMOLYSIS INDEX: Hemolysis Index: 6 Index (ref 0–24)

## 2023-03-06 LAB — C-REACTIVE PROTEIN: C-Reactive Protein: <0.1 mg/dL (ref 0.0–1.1)

## 2023-03-06 LAB — FOLLICLE STIMULATING HORMONE: Follicle Stimulating Hormone: 2.3 m[IU]/mL

## 2023-03-06 MED ORDER — SODIUM CHLORIDE 0.9 % IV SOLN
250.0000 mL | INTRAVENOUS | Status: DC
Start: 2023-03-06 — End: 2023-03-06
  Administered 2023-03-06: 250 mL via INTRAVENOUS

## 2023-03-06 MED ORDER — DIPHENHYDRAMINE HCL 50 MG/ML IJ SOLN
25.0000 mg | Freq: Once | INTRAMUSCULAR | Status: AC
Start: 2023-03-06 — End: 2023-03-06
  Administered 2023-03-06: 25 mg via INTRAVENOUS
  Filled 2023-03-06: qty 1

## 2023-03-06 MED ORDER — PREDNISONE 10 MG PO TABS
10.0000 mg | ORAL_TABLET | Freq: Every day | ORAL | 5 refills | Status: DC
Start: 2023-03-06 — End: 2023-08-25

## 2023-03-06 MED ORDER — ACETAMINOPHEN 325 MG PO TABS
650.0000 mg | ORAL_TABLET | Freq: Once | ORAL | Status: AC
Start: 2023-03-06 — End: 2023-03-06
  Administered 2023-03-06: 650 mg via ORAL
  Filled 2023-03-06: qty 2

## 2023-03-06 MED ORDER — SODIUM CHLORIDE 0.9 % IV SOLN
700.0000 mg | Freq: Once | INTRAVENOUS | Status: AC
Start: 2023-03-06 — End: 2023-03-06
  Administered 2023-03-06: 700 mg via INTRAVENOUS
  Filled 2023-03-06: qty 700

## 2023-03-06 MED ORDER — METHYLPREDNISOLONE SODIUM SUCC 40 MG IJ SOLR (WRAP)
40.0000 mg | Freq: Once | INTRAMUSCULAR | Status: AC
Start: 2023-03-06 — End: 2023-03-06
  Administered 2023-03-06: 40 mg via INTRAVENOUS
  Filled 2023-03-06: qty 1

## 2023-03-06 NOTE — Progress Notes (Signed)
FSH and estradiol not in menopausal ranges yet

## 2023-03-06 NOTE — Progress Notes (Signed)
Time: 1400    Hailey [name] is a 42 y.o. female here for infliximab infusion.  Reports arthralgias, diarrhea, fatigue, headaches, and nausea.  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    infliximab infused over 120 min without incident.    See MAR, VS, Doc Flowsheets for more details.  Pt tolerated the infusion well without problems.    Time: 1755, pt discharged stable, NAD.    RTC appointment in place for 04/03/2023    Drucilla Schmidt, RN

## 2023-03-06 NOTE — Progress Notes (Signed)
[facility]  MULTIPLE SCLEROSIS & NEUROIMMUNOLOGY CENTER  Main Line / Scheduling / on-call: [phone]  Clinic Tel: [phone]  Fax: [phone]  Send [place] a mychart or inbasket message for the fastest response  ~~~~~~~~~~~~~~~~~~~~~~~~~~~~~~~~~~~~~~~~~~~~~~~~~~~~~~~~~~~~~~~~~~~~~~~~~~~~~~~~~~~~    CC: Behcet's    HPI:   History was obtained from records review, patient,     42 y.o. year-old female with clinically established Behcet's (arthralgia, sialadenitis /sicca, +HLAB51, +pathergy, PET-pos adenopathy) on Humira, otezla and prednisone.     She still has speech difficulty and double vision.    Started remicaid 3/18th.     2nd seizure, and started VPA    Sleep is OK. Energy is very poor.       EXAM  Visit Vitals  BP 132/90 (BP Site: Left arm, Patient Position: Sitting, Cuff Size: Large)   Pulse 97   Temp 98.4 F (36.9 C) (Oral)   Ht 1.727 m (5\' 8" )   LMP  (LMP Unknown)   SpO2 93%   BMI 47.90 kg/m     General:   Fundoscopic:   Psychiatric.   Mental Status: The patient was awake, alert, appropriate. Expressive aphasia.   Cranial Nerves: CN II-XII with left gaze INO  Motor: 5/5  Reflex:   Sensation:   Coordination:   Gait: normal      Imaging     Reports:  MRI Brain W WO Contrast    Result Date: 01/28/2023  Impression:  No acute intracranial or orbital abnormality. Milan Waunita Schooner, MD 01/28/2023 7:57 AM    CT Head without Contrast    Result Date: 01/27/2023  Impression:   1.No intracranial abnormality is seen. 2. Given the history and symptoms, follow-up MRI may be of benefit. Theodoro Doing, MD 01/27/2023 2:29 PM    MRI Lumbar Spine WO Contrast    Result Date: 12/19/2022  Impression:  1.  No acute osseous or soft tissue abnormality. 2.  Normal MR appearance of the distal spinal cord, conus medullaris and cauda equina. 3.  Rotatory lumbar levoscoliosis. 4.  Lumbar spondylosis and facet arthrosis without central stenosis or nerve root compression. Please see full details and segmental analysis  above. Electronically signed by: Waynard Edwards M.D.  [facility] CM: 12/19/22       Testing       Labs  Lab Results   Component Value Date    HGBA1C 5.4 01/28/2023    EGFR >60.0 03/06/2023    CREAT 0.7 03/06/2023    WBC 9.73 (H) 03/06/2023    LYMPHOABS 2.5 07/25/2022    IGG 950 02/20/2023    IGA 216 01/25/2021    IGM 198 01/25/2021    B12 297 01/27/2023    VITD 28 (L) 01/23/2023     CSF glc 64 prot 23, r9, w0, JCV DNA neg, 4 serum matched oligoclonal bands, IgG index 0.57. encephalopathy panel pending      ASSESSMENT / PLAN  RTC: Return in about 7 weeks (around 04/27/2023) for [place].    1. Behcet's disease w/ CNS involvement    2. Intractable chronic migraine without aura and with status migrainosus    3. Chronic migraine without aura without status migrainosus, not intractable    4. Seizure-like activity    5. Medication monitoring encounter        41y/o F with Behcet's disease. MRI apperance on my review is unremarkable (formal read pendin). It should be noted MRI was performed after LP rather than before. CSF does show serum-matched oligoclonal bands  which does favor intrathecal inflammation. Even in the absence of typical brain changes suggestive of CNS behcet's, I would consider that she has CNS behcet's.    Unfortunatly, anti-adalimumab ab's could not be drawn.     FMLA ends in mid-June.     PLAN  Agree w/ infliximab 5mg /kg q 8 weeks; given worsening sx on prednisone (eg uveitis) she may require a higher dose.   Agree w/ [facility] [place]  Off prednisone currently  Labs today  Prednisone 10mg  qam    Orders today (details below)  Orders Placed This Encounter   Procedures    Adalimumab Level and Anti-drug Antibody for Rheumatic Diseases     Orders Placed This Encounter   Medications    predniSONE (DELTASONE) 10 MG tablet     Sig: Take 1 tablet (10 mg) by mouth daily     Dispense:  30 tablet     Refill:  5       I spent 40 minutes reviewing the records, talking with the patient and developing  their care plan. This excludes time for separately billed procedures.      Lora Havens, MD PHD  Director, Neuroimmunology & MS Center  [facility]  [address], [place], [place] [address]    Main line / scheduling: [phone]    After-hours Neurologist On Call: [phone]    For direct access, contact our MS Nurses  Allayne Stack RN or Estill Dooms RN  Tel [phone]  Fax [phone]     Fastest replies are via Agricultural engineer  ===================================================================  NFL (neurofilament light chain) Insurance Justification  Serum / plasma Neurofilament light chain is a well-established biomarker for MS relapses, and potentially for non-relapsing progressive disease as well (it is sensitive but not necessarily specific). It also has established utility in other CNS and peripheral nerve degenerative conditions. Recently, testing has become commercially available.  This data has emerged from multiple independent groups -- eg, NFL working groups in [place] & [place]; [place], [place], and [place] reference laboratories; [employer]; and studies of MS cohorts at [employer]; and clinical trials for various MS therapies. NFL can improve upon the sensitivity of MRI (current standard of care) for detecting disease activity/progression. Further, most MS therapies have been shown to decrease sNFL levels, with higher efficacy therapies demonstrating greater reduction than lower efficacy therapies. Overall, when sNFL values are interpreted in context of patient's clinical situation, they are likely to impact management and treatment decisions.     PATIENT INSTRUCTIONS PROVIDED  Patient was given an "After Visit Summary" with a copy of the testing orders, medications and the following other instructions:  There are no Patient Instructions on file for this visit.    IMPORTED INFORMATION FROM MEDICAL RECORDS    Diagnosis ICD-10-CM Associated Order   1. Behcet's disease w/ CNS involvement  M35.2 Adalimumab Level and Anti-drug Antibody for Rheumatic Diseases      2. Intractable chronic migraine without aura and with status migrainosus  G43.711 Adalimumab Level and Anti-drug Antibody for Rheumatic Diseases      3. Chronic migraine without aura without status migrainosus, not intractable  G43.709 Adalimumab Level and Anti-drug Antibody for Rheumatic Diseases      4. Seizure-like activity  R56.9 Adalimumab Level and Anti-drug Antibody for Rheumatic Diseases      5. Medication monitoring encounter  Z51.81 Adalimumab Level and Anti-drug Antibody for Rheumatic Diseases  Orders Placed This Encounter   Procedures    Adalimumab Level and Anti-drug Antibody for Rheumatic Diseases     Standing Status:   Future     Number of Occurrences:   1     Standing Expiration Date:   03/05/2024     Order Specific Question:   Release to patient     Answer:   Immediate

## 2023-03-08 HISTORY — PX: UPPER GASTROINTESTINAL ENDOSCOPY: SHX188

## 2023-03-08 LAB — CSF ENCEPHALITIS ANTIBODY EVALUATION WITH REFLEX TITER AND LINE BLOT
CSF AGNA/SOX1 Antibody, IFA: NEGATIVE
CSF AMPAR1 Antibody, CBA: NEGATIVE
CSF AMPAR2 Antibody, CBA: NEGATIVE
CSF ANNA1 (HU) Antibody, IFA: NEGATIVE
CSF ANNA2 (RI) Antibody, IFA: NEGATIVE
CSF ANNA3 Antibody, IFA: NEGATIVE
CSF Amphiphysin Antibody, IFA: NEGATIVE
CSF Aquaporin-4 Antibody IGG, CBA: NEGATIVE
CSF Aquaporin-4 Antibody, IFA: NEGATIVE
CSF CASPR2 Antibody, CBA: NEGATIVE
CSF CRMP5/CV2 Antibody, IFA: NEGATIVE
CSF DPPX Antibody, CBA: NEGATIVE
CSF GABABR Antibody,CBA: NEGATIVE
CSF GAD65 Antibody, IFA: NEGATIVE
CSF LGI1 Antibody, CBA: NEGATIVE
CSF MA2/TA AB, IFA: NEGATIVE
CSF MYELIN Antibody, IFA: NEGATIVE
CSF NMDAR1 Antibody, CBA: NEGATIVE
CSF PCA-TR (DNER) Antibody, IFA: NEGATIVE
CSF PCA1 (YO) Antibody, IFA: NEGATIVE
CSF PCA2 Antibody, IFA: NEGATIVE
CSF VGKC Antibody: <20 pmol/L (ref <20)

## 2023-03-08 LAB — CSF OLIGOCLONAL BANDS: CSF Oligoclonal Bands: ABSENT

## 2023-03-08 LAB — CSF JC POLYOMA VIRUS DNA, QUALITATIVE REAL-TIME PCR: CSF JC Polyoma Virus DNA, Qualitative: NOT DETECTED

## 2023-03-09 LAB — HUMAN PAPILLOMA VIRUS (HPV) MRNA E6-E7 WITH REFLEX TO 16, 18/45: HPV RNA, High Risk, E6-E7, TMA: NOT DETECTED

## 2023-03-10 ENCOUNTER — Encounter (INDEPENDENT_AMBULATORY_CARE_PROVIDER_SITE_OTHER): Payer: Self-pay

## 2023-03-10 ENCOUNTER — Ambulatory Visit (INDEPENDENT_AMBULATORY_CARE_PROVIDER_SITE_OTHER): Payer: BLUE CROSS/BLUE SHIELD

## 2023-03-10 NOTE — PSS Phone Screening (Signed)
Pre-Anesthesia Evaluation    Pre-op phone visit requested by:   Reason for pre-op phone visit: Patient anticipating EGD, COLONOSCOPY, DIAGNOSTIC (SCREENING) procedure.    ER 01/19/23 for aphasia. Records in Epic   New onset seizure disorder possible due to Behcet's:all MD are from [place] so info in Epic    No orders of the defined types were placed in this encounter.      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   09/25/2020    History of Wolff-Parkinson-White (WPW) syndrome Z86.79   09/25/2020    Overview Signed 09/25/2020  1:30 PM by Lurlean Horns, MD     Ablation 2005         Scoliosis deformity of spine M41.9   09/25/2020    Behcet's disease M35.2   02/12/2021    Immunocompromised state due to drug therapy D84.821, Z79.899   04/19/2021    S/P hysterectomy Z90.710   01/14/2022    Premature ventricular contractions (PVCs) (VPCs) I49.3   03/02/2022    Palpitations R00.2   03/02/2022    NICM (nonischemic cardiomyopathy) I42.8   05/17/2022    PVC's (premature ventricular contractions) I49.3   06/08/2022    Abdominal pain, unspecified abdominal location R10.9   10/18/2022    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 Released from  cardiology    Behcet's disease     She sees Dr. 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    02/11/23 stable on medical    Diarrhea     Ear, nose and throat disorder     seasonal allergies    Fatigue     Bechet's    Fever 11/09/2022    Headache     migraine    Hx of ovarian cyst     Lower back pain     not severe    Meningitis 2011    Viral // treated and resolved    Pain in wrist 11/09/2022    Post-operative nausea and vomiting     Scoliosis     moderate// used a Brace at a young age    Uveitis     using eyedrops     Past Surgical History:   Procedure Laterality Date    ABLATION - PVC'S N/A 06/08/2022    Procedure: Ablation - PVC's;  Surgeon: [name], MD;  Location: [facility];  Service: Cardiovascular;  Laterality: N/A;  SAME DAY DISCHARGE  CARTO NO TEE    ARTHROTOMY, WRIST Right 12/03/2020    Procedure: RIGHT UPPER EXTREMITY SYNOVIAL BIOPSY;  Surgeon: Kellie Simmering, MD;  Location: [facility];  Service: Plastics;  Laterality: Right;    BIOPSY, LYMPH NODE N/A 07/23/2018    Procedure: BIOPSY, LYMPH NODE;  Surgeon: Shelda Pal, MD;  Location: [facility];  Service: ENT;  Laterality: N/A;  NECK DEEP LYMPH NODE BIOPSY    BONE MARROW BIOPSY  2020    led to behcet's dx    CARDIAC ABLATION  2005    [facility] for Wolff-Parkinson-White    CHOLECYSTECTOMY  2009    DILATION AND CURETTAGE OF UTERUS  2010    FINGER GANGLION CYST EXCISION  04/2018    GANGLION CYST EXCISION  2014    04/2018 cyst removed from index finger-[facility]    HYSTERECTOMY  2012    HYSTEROSCOPY  2011    indication: Abnormal bleeding    LAPAROSCOPIC, LYSIS, ADHESIONS N/A 11/09/2022    Procedure: LAPAROSCOPIC LYSIS OF ADHESIONS;  Surgeon: Devota Pace, MD;  Location: [facility];  Service: Gynecology;  Laterality: N/A;    LAPAROSCOPIC, SALPINGECTOMY Left 11/09/2022    Procedure: LAPAROSCOPIC, SALPINGECTOMY LEFT;  Surgeon: Devota Pace, MD;  Location: [facility];  Service:  Gynecology;  Laterality: Left;    LAPAROSCOPY, DIAGNOSTIC N/A 11/09/2022    Procedure: LAPAROSCOPY, DIAGNOSTIC;  Surgeon: Devota Pace, MD;  Location:  [facility];  Service: Gynecology;  Laterality: N/A;  [id number]    UMBILICAL HERNIA REPAIR  2016    UPPER GASTROINTESTINAL ENDOSCOPY  2009    WISDOM TOOTH EXTRACTION  1998        Medication List            Accurate as of Mar 10, 2023  2:03 PM. Always use your most recent med list.                Azelastine-Fluticasone 137-50 MCG/ACT Susp  1 spray by Nasal route 2 (two) times daily  Medication Adjustments for Surgery: Take morning of surgery     Botox 200 units injection  Inject 155 units IM every 12 weeks  Generic drug: botulinum toxin type A  Medication Adjustments for Surgery: Take as prescribed     busPIRone 5 MG tablet  Take 1 tablet (5 mg) by mouth 2 (two) times daily  Commonly known as: BUSPAR  Medication Adjustments for Surgery: Take morning of surgery     clindamycin 1 % lotion  Apply topically every morning  Commonly known as: CLEOCIN T  Medication Adjustments for Surgery: Hold day of surgery     colesevelam 625 MG tablet  Take 2 tablets (1,250 mg) by mouth every morning  Commonly known as: WELCHOL  Medication Adjustments for Surgery: Take morning of surgery     cyanocobalamin 1000 MCG tablet  Take 1 tablet (1,000 mcg) by mouth daily  Medication Adjustments for Surgery: Hold day of surgery     Dapsone 7.5 % Gel  Apply topically every morning  Medication Adjustments for Surgery: Hold day of surgery     diazePAM 5 MG tablet  Take 45 min prior to mri. Take an extra tab if needed  Commonly known as: VALIUM  Medication Adjustments for Surgery: Take as prescribed     dicyclomine 10 MG capsule  Take 1 capsule (10 mg) by mouth 4 (four) times daily as  needed (abdominal pain)  Commonly known as: BENTYL  Medication Adjustments for Surgery: Take as needed     divalproex (DR) delayed release 500 MG EC tablet  Take 1 tablet (500 mg) by mouth 2 (two) times daily  Commonly  known as: DEPAKOTE  Medication Adjustments for Surgery: Take morning of surgery     DORZOLAMIDE HCL OP  Apply 1 drop to eye as needed Just started/does not have instructions yet  Medication Adjustments for Surgery: Take as prescribed     eletriptan 40 MG tablet  Take 1 tablet (40 mg) by mouth as needed for Migraine .  Wait at least 2 hours prior to taking second dose.  Max 2 tabs/24 hours. Do not use more than 3 days per week.  Commonly known as: RELPAX     EPINEPHrine 0.3 MG/0.3ML Soaj injection  Inject 0.3 mLs (0.3 mg) into the muscle once as needed (Anaphylaxis)  Medication Adjustments for Surgery: Take as needed     hydrOXYzine 10 MG tablet  Take 1 tablet (10 mg) by mouth nightly  Commonly known as: ATARAX  Medication Adjustments for Surgery: Take as prescribed     inFLIXimab 100 MG injection  Infuse into the vein once every eight weeks  Medication Adjustments for Surgery: Take as prescribed     Jublia 10 % Soln  Apply topically daily  Generic drug: Efinaconazole  Medication Adjustments for Surgery: Hold day of surgery     lidocaine-prilocaine cream  Apply topically as needed Dermatology procedures  Commonly known as: EMLA  Medication Adjustments for Surgery: Take as prescribed     MULTIVITAMIN PO  Take by mouth  Medication Adjustments for Surgery: Hold day of surgery     ondansetron 4 MG disintegrating tablet  Take 1 tablet (4 mg) by mouth every 8 (eight) hours as needed for Nausea  Commonly known as: ZOFRAN-ODT  Medication Adjustments for Surgery: Take as needed     Otezla 30 MG Tabs  Take 1 tablet (30 mg) by mouth 2 (two) times daily  Generic drug: Apremilast  Notes to patient: Will take after procedure because takes with food.     predniSONE 10 MG tablet  Take 1 tablet (10 mg) by mouth daily  Commonly known as: DELTASONE  Medication Adjustments for Surgery: Take morning of surgery     Rimegepant Sulfate 75 MG Tbdp  Take 1 tablet (75 mg) by mouth every other day For migraine prevention  Medication Adjustments  for Surgery: Take as prescribed     rizatriptan 10 MG tablet  Take 1-2 tablets (10-20 mg total) by mouth once as needed for Migraine Max 3 days a week  Commonly known as: MAXALT  Medication Adjustments for Surgery: Take as needed     valACYclovir 1000 MG tablet  Take 2 tabs at onset; repeat once in 12 hours.  Total 4 tabs per outbreak  Commonly known as: VALTREX  Medication Adjustments for Surgery: Take as needed     VITAMIN D PO  Take by mouth  Medication Adjustments for Surgery: Hold day of surgery            Allergies   Allergen Reactions    Fruit Blend Flavor [Flavoring Agent] Anaphylaxis and Swelling     Tropical Fruit     Latex Hives, Swelling, Other (See Comments) and Drug-Induced Flushing    Adhesive [Wound Dressing Adhesive] Itching    Cyanoacrylate Itching    Other Itching and Rash     Sutures Monocryl  Oxycodone Hallucinations     Family History   Adopted: Yes     Social History     Occupational History    Occupation: Attorney    Tobacco Use    Smoking status: Never     Passive exposure: Never    Smokeless tobacco: Never   Vaping Use    Vaping status: Never Used   Substance and Sexual Activity    Alcohol use: Never    Drug use: Never    Sexual activity: Yes     Partners: Male     Birth control/protection: None     Comment: Hysterectomy       Menstrual History:   LMP / Status  Hysterectomy     No LMP recorded (lmp unknown). Patient has had a hysterectomy.    Tubal Ligation?  No valid surgical or medical questions entered.           Exam Scores:   SDB score  OSA Risk Category: No Risk        STBUR score       PONV score  Nausea Risk: SEVERE RISK    MST score  MST Score: 0    PEN-FAST score       Frailty score       CHADsVasc            Visit Vitals  Ht 1.727 m (5\' 8" )   Wt 142.9 kg (315 lb)   LMP  (LMP Unknown)   BMI 47.90 kg/m       Recent Labs   CBC (last 180 days) 02/06/23  0754 03/06/23  1355   WBC 10.96* 9.73*   RBC 4.47 4.37   Hgb 14.1 13.7   Hematocrit 41.9 40.3   MCV 93.7 92.2   MCH 31.5 31.4    MCHC 33.7 34.0   RDW 13 13   Platelets 353* 360*   MPV 8.7* 8.3*   Nucleated RBC 0.0 0.0   Absolute NRBC 0.00 0.00     Recent Labs   BMP (last 180 days) 02/06/23  0754 03/06/23  1355   Glucose 109* 105*   BUN 10.0 8.0   Creatinine 0.9 0.7   Sodium 139 139   Potassium 4.4 4.0   Chloride 107 108   CO2 22 25   Calcium 9.6 9.6   Anion Gap 10.0 6.0   eGFR >60.0 >60.0     Recent Labs   Coag Panel (last 180 days) 01/27/23  1352 02/06/23  0754   PTT 32 33   PT 11.3 11.2   PT INR 1.0 1.0     Recent Labs   Other (last 180 days) 10/27/22  0944 11/23/22  1450 11/25/22  0341 01/23/23  1238 01/23/23  1335 01/27/23  1800 01/28/23  0212 02/06/23  0754 03/06/23  1355   TSH  --   --   --  1.36  --   --   --   --   --    Bilirubin, Total  --  0.6   < >  --    < >  --   --  0.5 0.3   ALT  --  51   < >  --    < >  --   --  37 39   AST (SGOT)  --  36   < >  --    < >  --   --  28 30   Protein, Total  --  7.7   < >  --    < >  --   --  7.6 7.7   Hemoglobin A1C 5.3  --   --   --   --   --  5.4  --   --    Vitamin B-12  --   --   --   --   --  297  --   --   --    SARS-CoV-2 Specimen Source  --  Nasal Swab  --   --   --   --   --   --   --    SARS CoV 2 Overall Result  --  Not Detected  --   --   --   --   --   --   --     < > = values in this interval not displayed.

## 2023-03-13 ENCOUNTER — Encounter (INDEPENDENT_AMBULATORY_CARE_PROVIDER_SITE_OTHER): Payer: Self-pay | Admitting: Family Medicine

## 2023-03-13 LAB — ADALIMUMAB LEVEL & ANTI-DRUG AB RHEUM DISEASES
Adalimumab Ab, Rheum: 48 AU — ABNORMAL HIGH (ref <10)
Adalimumab Drug Level: 1.6 ug/mL

## 2023-03-17 ENCOUNTER — Telehealth (INDEPENDENT_AMBULATORY_CARE_PROVIDER_SITE_OTHER): Payer: Self-pay

## 2023-03-17 LAB — LAB USE ONLY - HISTORICAL GYN CYTOLOGY/PAP SMEAR

## 2023-03-17 NOTE — Telephone Encounter (Signed)
Called the patient regarding upcoming procedure on Tuesday, Mar 21, 2023 at 10:45 AM at [facility] with Dr. Cyndie Chime. Informed the patient they would have to arrive 90 minutes prior to procedure time. Reminded the patient they would need to follow a clear liquid diet the day before their procedure from the moment they wake up. Advised the patient they must have someone to drive them home from their GI procedure, otherwise, their procedure would be cancelled. Patient stated they had successfully picked up their bowel prep from the pharmacy. Patient understood and had no further questions.

## 2023-03-17 NOTE — Progress Notes (Signed)
Normal pap

## 2023-03-18 ENCOUNTER — Encounter (INDEPENDENT_AMBULATORY_CARE_PROVIDER_SITE_OTHER): Payer: Self-pay

## 2023-03-21 ENCOUNTER — Ambulatory Visit: Payer: BLUE CROSS/BLUE SHIELD | Admitting: Pain Medicine

## 2023-03-21 ENCOUNTER — Encounter: Payer: Self-pay | Admitting: Transplant Hepatology

## 2023-03-21 ENCOUNTER — Encounter
Admission: RE | Disposition: A | Discharge status: Home / Self Care | Payer: Self-pay | Source: Ambulatory Visit | Attending: Transplant Hepatology

## 2023-03-21 ENCOUNTER — Ambulatory Visit
Admission: RE | Admit: 2023-03-21 | Discharge: 2023-03-21 | Disposition: A | Discharge status: Home / Self Care | Payer: BLUE CROSS/BLUE SHIELD | Source: Ambulatory Visit | Attending: Transplant Hepatology | Admitting: Transplant Hepatology

## 2023-03-21 DIAGNOSIS — K2950 Unspecified chronic gastritis without bleeding: Secondary | ICD-10-CM | POA: Insufficient documentation

## 2023-03-21 DIAGNOSIS — I493 Ventricular premature depolarization: Secondary | ICD-10-CM | POA: Insufficient documentation

## 2023-03-21 DIAGNOSIS — R1084 Generalized abdominal pain: Secondary | ICD-10-CM | POA: Insufficient documentation

## 2023-03-21 DIAGNOSIS — M352 Behcet's disease: Secondary | ICD-10-CM | POA: Insufficient documentation

## 2023-03-21 DIAGNOSIS — M0600 Rheumatoid arthritis without rheumatoid factor, unspecified site: Secondary | ICD-10-CM | POA: Insufficient documentation

## 2023-03-21 DIAGNOSIS — K219 Gastro-esophageal reflux disease without esophagitis: Secondary | ICD-10-CM | POA: Insufficient documentation

## 2023-03-21 DIAGNOSIS — K644 Residual hemorrhoidal skin tags: Secondary | ICD-10-CM | POA: Insufficient documentation

## 2023-03-21 DIAGNOSIS — R112 Nausea with vomiting, unspecified: Secondary | ICD-10-CM | POA: Insufficient documentation

## 2023-03-21 DIAGNOSIS — K3189 Other diseases of stomach and duodenum: Secondary | ICD-10-CM

## 2023-03-21 DIAGNOSIS — G43109 Migraine with aura, not intractable, without status migrainosus: Secondary | ICD-10-CM | POA: Insufficient documentation

## 2023-03-21 DIAGNOSIS — R1310 Dysphagia, unspecified: Secondary | ICD-10-CM | POA: Insufficient documentation

## 2023-03-21 DIAGNOSIS — R110 Nausea: Secondary | ICD-10-CM | POA: Insufficient documentation

## 2023-03-21 DIAGNOSIS — R197 Diarrhea, unspecified: Secondary | ICD-10-CM | POA: Insufficient documentation

## 2023-03-21 HISTORY — PX: COLONOSCOPY, DIAGNOSTIC (SCREENING): SHX174

## 2023-03-21 HISTORY — PX: EGD: SHX3789

## 2023-03-21 SURGERY — DONT USE, USE 1095-ESOPHAGOGASTRODUODENOSCOPY (EGD), DIAGNOSTIC
Anesthesia: Anesthesia General | Site: Anus

## 2023-03-21 MED ORDER — PROPOFOL 10 MG/ML IV EMUL (WRAP)
INTRAVENOUS | Status: DC | PRN
Start: 2023-03-21 — End: 2023-03-21
  Administered 2023-03-21: 50 mg via INTRAVENOUS
  Administered 2023-03-21: 100 mg via INTRAVENOUS
  Administered 2023-03-21: 50 mg via INTRAVENOUS

## 2023-03-21 MED ORDER — ONDANSETRON HCL 4 MG/2ML IJ SOLN
INTRAMUSCULAR | Status: AC
Start: 2023-03-21 — End: ?
  Filled 2023-03-21: qty 4

## 2023-03-21 MED ORDER — LACTATED RINGERS IV SOLN
INTRAVENOUS | Status: DC
Start: 2023-03-21 — End: 2023-03-21

## 2023-03-21 MED ORDER — FENTANYL CITRATE (PF) 50 MCG/ML IJ SOLN (WRAP)
INTRAMUSCULAR | Status: DC | PRN
Start: 2023-03-21 — End: 2023-03-21
  Administered 2023-03-21 (×2): 25 ug via INTRAVENOUS
  Administered 2023-03-21: 50 ug via INTRAVENOUS

## 2023-03-21 MED ORDER — PROPOFOL INFUSION 10 MG/ML
INTRAVENOUS | Status: DC | PRN
Start: 2023-03-21 — End: 2023-03-21
  Administered 2023-03-21: 100 ug/kg/min via INTRAVENOUS

## 2023-03-21 MED ORDER — LIDOCAINE HCL (PF) 2 % IJ SOLN
INTRAMUSCULAR | Status: DC | PRN
Start: 2023-03-21 — End: 2023-03-21
  Administered 2023-03-21: 100 mg via INTRADERMAL

## 2023-03-21 MED ORDER — FENTANYL CITRATE (PF) 50 MCG/ML IJ SOLN (WRAP)
INTRAMUSCULAR | Status: AC
Start: 2023-03-21 — End: ?
  Filled 2023-03-21: qty 2

## 2023-03-21 MED ORDER — ONDANSETRON HCL 4 MG/2ML IJ SOLN
INTRAMUSCULAR | Status: DC | PRN
Start: 2023-03-21 — End: 2023-03-21
  Administered 2023-03-21 (×2): 4 mg via INTRAVENOUS

## 2023-03-21 MED ORDER — PROPOFOL 10 MG/ML IV EMUL (WRAP)
INTRAVENOUS | Status: AC
Start: 2023-03-21 — End: ?
  Filled 2023-03-21: qty 40

## 2023-03-21 MED ORDER — LIDOCAINE HCL 2 % IJ SOLN
INTRAMUSCULAR | Status: AC
Start: 2023-03-21 — End: ?
  Filled 2023-03-21: qty 20

## 2023-03-21 MED ORDER — GLYCOPYRROLATE 0.2 MG/ML IJ SOLN (WRAP)
INTRAMUSCULAR | Status: DC | PRN
Start: 2023-03-21 — End: 2023-03-21
  Administered 2023-03-21: .4 mg via INTRAVENOUS

## 2023-03-21 SURGICAL SUPPLY — 35 items
BLOCK BITE OD60 FR STURDY STRAP SIDEPORT (Procedure Accessories) ×2
BLOCK BITE OD60 FR STURDY STRAP SIDEPORT DENTAL RETENTION RIM MAXI (Procedure Accessories) ×2 IMPLANT
CAP ENDOSCOPE SEALING HYDRA OLYMPUS L16 (GE Lab Supplies) ×2
CAP ENDOSCOPE SEALING HYDRA OLYMPUS L16 IN L18IN WATER BTTL CO2 CLIP (GE Lab Supplies) ×2 IMPLANT
CATHETER OD7 FR L300 CM BIPOLAR ROUND (Procedure Accessories)
CATHETER OD7 FR L300 CM BIPOLAR ROUND DISTAL TIP STANDARD CONNECTOR (Procedure Accessories) IMPLANT
ELECTRODE ADULT PATIENT RETURN L9 FT REM POLYHESIVE ACRYLIC FOAM (Procedure Accessories) IMPLANT
ELECTRODE PATIENT RETURN L9 FT VALLEYLAB (Procedure Accessories)
FORCEPS BIOPSY L240 CM JUMBO MICROMESH (Instrument)
FORCEPS BIOPSY L240 CM JUMBO MICROMESH TEETH STREAMLINE CATHETER (Instrument) IMPLANT
FORCEPS BIOPSY L240 CM LARGE CAPACITY (Instrument) ×2
FORCEPS BIOPSY L240 CM MICROMESH TEETH STREAMLINE CATHETER NEEDLE (Instrument) ×2 IMPLANT
KIT ENDOSCOPIC L6 FT CLEAN ADAPTER (Procedure Accessories) ×2
KIT ENDOSCOPIC L6 FT CLEAN ADAPTER COMPLIANCE ENDOKIT ORCAPOD 4 1.1 OZ (Procedure Accessories) ×2 IMPLANT
MARKER ENDOSCOPIC PERMANENT INDICATION (Syringes, Needles)
MARKER ENDOSCOPIC PERMANENT INDICATION DARK SYRINGE SPOT EX 5 ML (Syringes, Needles) IMPLANT
MASK OXYGEN MEDIUM CONCENTRATION ADULT POM 7FT 3010318LTEZ (Other) ×2 IMPLANT
MASK POM EZ-LITE BRONCHO (Other) ×2
NEEDLE SCLEROTHERAPY CARR-LOCKE OD25 GA ODSEC2.5 MM L230 CM INJECTION (Needles) IMPLANT
NEEDLE SCLEROTHERAPY OD25 GA ODSEC2.5 MM (Needles)
PROBE ELECTROSURGICAL L220 CM FLEXIBLE (Procedure Accessories)
PROBE ELECTROSURGICAL L220 CM FLEXIBLE STRAIGHT FIRE OD2.3 MM FIAPC (Procedure Accessories) IMPLANT
SNARE ESCP MIC CPTVTR 13MM 240IN STRL (GE Lab Supplies)
SNARE MEDIUM OVAL L240 CM OD2.4 MM (Procedure Accessories)
SNARE MEDIUM OVAL L240 CM OD2.4 MM SENSATION LOOP SHORT THROW FLEXIBLE (Procedure Accessories) IMPLANT
SNARE SMALL HEXAGON CAPTIVATOR STIFF ENDOSCOPIC POLYPECTOMY (GE Lab Supplies) IMPLANT
SNARE SPIRAL 230CM 2.8MM 20MM SNRMSTR RDG WIRE ENDOSCOPIC POLYPECTOMY (Procedure Accessories) IMPLANT
SNARE SPIRAL L230 CM OD2.8 MM ODSEC20 MM (Procedure Accessories)
SPONGE GAUZE L4 IN X W4 IN 16 PLY (Dressing) ×2
SPONGE GAUZE L4 IN X W4 IN 16 PLY MAXIMUM ABSORBENT USP TYPE VII (Dressing) ×2 IMPLANT
SYRINGE 50 ML GRADUATE NONPYROGENIC DEHP (Syringes, Needles)
SYRINGE 50 ML GRADUATE NONPYROGENIC DEHP FREE PVC FREE BD MEDICAL (Syringes, Needles) IMPLANT
TRAP MUCUS SCREW CAP TUBE ID LABEL (Procedure Accessories)
TRAP MUCUS SCREW CAP TUBE ID LABEL MEDLINE PLASTIC CLEAR (Procedure Accessories) IMPLANT
TUBING IRRIGATION HYDRA (Tubing) ×2 IMPLANT

## 2023-03-21 NOTE — Transfer of Care (Signed)
Anesthesia Transfer of Care Note    Patient: Hailey [name]    Procedures performed: Procedure(s):  EGD  COLONOSCOPY, DIAGNOSTIC (SCREENING)    Anesthesia type: General TIVA    Patient location:Phase II PACU    Last vitals:   Vitals:    03/21/23 1022   BP: 129/73   Pulse: 90   Resp:    Temp:    SpO2: 94%       Post pain: Patient not complaining of pain, continue current therapy      Mental Status:awake and alert     Respiratory Function: tolerating room air    Cardiovascular: stable    Nausea/Vomiting: patient not complaining of nausea or vomiting    Hydration Status: adequate    Post assessment: no apparent anesthetic complications and no reportable events    Signed by: Jasmine December, CRNA  03/21/23 10:25 AM

## 2023-03-21 NOTE — Anesthesia Postprocedure Evaluation (Addendum)
Anesthesia Post Evaluation    Patient: Hailey [name]    Procedure(s):  EGD  COLONOSCOPY, DIAGNOSTIC (SCREENING)    Anesthesia type: general    Last Vitals:   Vitals Value Taken Time   BP 139/85 03/21/23 1040   Temp 37.2 C (99 F) 03/21/23 1022   Pulse 86 03/21/23 1040   Resp 17 03/21/23 1040   SpO2 95 % 03/21/23 1040                 Anesthesia Post Evaluation:     Patient Evaluated: PACU  Patient Participation: complete - patient participated  Level of Consciousness: awake  Pain Score: 0  Pain Management: satisfactory to patient (TIVA endo)  Non-opioid multimodal analgesia not used between 6 hours prior to anesthesia start and PACU discharge    Airway Patency: patent (TIVA endo)        Anesthetic complications: No      PONV Status: none    Cardiovascular status: acceptable and stable  Respiratory status: acceptable  Hydration status: acceptable  Comments: Endoscopy    Quality/Regulatory Reporting Exceptions  : TIVA endo.  Medical reason(s) for not administering 2+ classes of antiemetics: other (use comment) (TIVA endo)  Medical reason(s) for not using multimodal pain management: no pain reported (TIVA endo)  : TIVA endo.  : TIVA endo.        Signed by: Juanell Fairly, MD, 03/21/2023 10:49 AM

## 2023-03-21 NOTE — Anesthesia Preprocedure Evaluation (Addendum)
Anesthesia Evaluation    AIRWAY    Mallampati: II    TM distance: >3 FB  Neck ROM: full  Mouth Opening:full  Planned to use difficult airway equipment: No CARDIOVASCULAR    cardiovascular exam normal, regular and normal       DENTAL    no notable dental hx               PULMONARY    pulmonary exam normal and clear to auscultation     OTHER FINDINGS                                        Relevant Problems   NEURO/PSYCH   (+) Migraine with aura and without status migrainosus, not intractable   (+) Post-dural puncture headache      CARDIO   (+) Behcet's disease   (+) Migraine with aura and without status migrainosus, not intractable   (+) PVC's (premature ventricular contractions)   (+) Premature ventricular contractions (PVCs) (VPCs)      OTHER   (+) Seronegative rheumatoid arthritis       PSS Anesthesia Comments: Behcet's disease, , Convulsions, , Hx of cardiac ablation, ( PVC's)Postop nausea/ vomiting        Anesthesia Plan    ASA 3     general                     intravenous induction   Detailed anesthesia plan: general IV  Monitors/Adjuncts: other    Post Op: other  Trial extubation is not planned.    Post op pain management: per surgeon    informed consent obtained    Plan discussed with CRNA.                     Signed by: Juanell Fairly, MD 03/21/23 9:05 AM

## 2023-03-21 NOTE — Addendum Note (Signed)
Addendum  created 03/21/23 1050 by Juanell Fairly, MD    Clinical Note Signed, Intraprocedure Event edited

## 2023-03-21 NOTE — Discharge Instr - AVS First Page (Addendum)
Endoscopy Discharge Instructions  General Instructions:  1. Following sedation, your judgement, perception, and coordination are considered impaired. Even though you may feel awake and alert, you are considered legally intoxicated. Therefore, until the next morning;  Do not drive  Do not operate appliances or equipment that requires reaction time (e.g. stove, electrical tools, machinery)  Do not sign legal documents or be involved in important decisions.  Do not smoke if alone  Do not drink alcoholic beverages  Go directly home and rest for several hours before resuming your routine activities.  It is highly recommended to have a responsible adult stay with you for the next 24 hours    2. Tenderness, swelling or pain may occur at the IV site where you received sedation. If you experience this, apply warm soaks to the area. Notify your physician if this persists.    Instructions Specific To Procedures - Report To Physician Any Of The Following:    Upper Endoscopy, ERCP, Dilations   1. Pain in Chest   2. Nausea/vomiting   3. Fevers/Chills within 24 hours after procedure. Temp>101deg F   4. Severe and persistent abdominal pain and bloating     In Addition:   Mild throat soreness may follow this procedure. Warm salt water gargling or    lozenges of your choice will most likely relieve your discomfort or cold drinks and   popsicles.     Colon/Sigmoidoscopy/Proctoscopy   1. Severe and persistent abdominal pain/bloating which does not subside within   2-3 hours   2. Large amount of rectal bleeding (some mucosal blood streaking may occur,   especially if biopsy or polypectomy was done or if hemorrhoids are present.   3. Nausea/vomiting   4. Fevers/Chills within 24 hours after procedure. Temp>101deg F     In Addition:   If polyp has been removed, DO NOT take aspirin or aspirin containing products   (e.g. Anacin, Alka Seltzer, Bufferin, Etc.) or non-steroidal anti-inflammatory drugs   (e.g. Advil, Motrin, etc.) for 2-3 days  unless otherwise advised by doctor. Tylenol   or extra strength Tylenol is permitted.    Additional Discharge Instructions  Your diet after the procedure: Nothing red to eat or drink for 24 hours. The first meal should be light. Nothing greasy, spicy, or fried. If that meal is tolerated you may resume your normal diet.            If you have questions or problems contact your MD immediately. If you need immediate attention, call your MD, 911 and/or go to nearest emergency room.

## 2023-03-21 NOTE — Consults (Signed)
[address]  [place], [place] [address]    ENDOSCOPIC PROCEDURE H&P    Subjective:      Date: @DATE @ 9:30 AM   Patient ID: Hailey [name] is a 42 y.o. female.    Chief Complaint:    GERD  Dysphagia  Abdominal pain  Diarrhea        Current Medications:    Outpatient Medications Marked as Taking for the 03/21/23 encounter [facility] Encounter)   Medication Sig Dispense Refill    Azelastine-Fluticasone 137-50 MCG/ACT Suspension 1 spray by Nasal route 2 (two) times daily      busPIRone (BUSPAR) 5 MG tablet Take 1 tablet (5 mg) by mouth 2 (two) times daily 180 tablet 3    clindamycin (CLEOCIN T) 1 % lotion Apply topically every morning      colesevelam (WELCHOL) 625 MG tablet Take 2 tablets (1,250 mg) by mouth every morning 180 tablet 3    Dapsone 7.5 % Gel Apply topically every morning      dicyclomine (BENTYL) 10 MG capsule Take 1 capsule (10 mg) by mouth 4 (four) times daily as needed (abdominal pain) 30 capsule 2    divalproex, DR, delayed release (DEPAKOTE) 500 MG EC tablet Take 1 tablet (500 mg) by mouth 2 (two) times daily 60 tablet 2    DORZOLAMIDE HCL OP Apply 1 drop to eye as needed Just started/does not have instructions yet      hydrOXYzine (ATARAX) 10 MG tablet Take 1 tablet (10 mg) by mouth nightly 90 tablet 3    Jublia 10 % Solution Apply topically daily      Multiple Vitamin (MULTIVITAMIN PO) Take by mouth      predniSONE (DELTASONE) 10 MG tablet Take 1 tablet (10 mg) by mouth daily 30 tablet 5    vitamin B-12 1000 MCG tablet Take 1 tablet (1,000 mcg) by mouth daily 30 tablet 0    VITAMIN D PO Take by mouth         Allergies:  Allergies   Allergen Reactions    Fruit Blend Flavor [Flavoring Agent] Anaphylaxis and Swelling     Tropical Fruit     Latex Hives, Swelling, Other (See Comments) and Drug-Induced Flushing    Adhesive [Wound Dressing Adhesive] Itching    Cyanoacrylate Itching    Other Itching and Rash     Sutures Monocryl    Oxycodone Hallucinations        Past Medical History:    has a past medical history of Abnormal vision, Anxiety, Arrhythmia, Behcet's disease, BMI 45.0-49.9, adult, Convulsions (01/2023), Diarrhea, Ear, nose and throat disorder, Fatigue, Fever (11/09/2022), Headache, ovarian cyst, Lower back pain, Meningitis (2011), Pain in wrist (11/09/2022), Post-operative nausea and vomiting, Scoliosis, and Uveitis.     Past Surgical History:   has a past surgical history that includes Wisdom tooth extraction (1998); Ganglion cyst excision (2014); Umbilical hernia repair (2016); Finger ganglion cyst excision (04/2018); BIOPSY, LYMPH NODE (N/A, 07/23/2018); Cholecystectomy (2009); Dilation and curettage of uterus (2010); Hysterectomy (2012); Hysteroscopy (2011); Upper gastrointestinal endoscopy (2009); Bone marrow biopsy (2020); ARTHROTOMY, WRIST (Right, 12/03/2020); CARDIAC ABLATION (2005); Ablation - PVC's (N/A, 06/08/2022); LAPAROSCOPY, DIAGNOSTIC (N/A, 11/09/2022); LAPAROSCOPIC, LYSIS, ADHESIONS (N/A, 11/09/2022); and LAPAROSCOPIC, SALPINGECTOMY (Left, 11/09/2022).     Family History:  family history is not on file. She was adopted.     Social History:   reports that she has never smoked. She has never been exposed to tobacco smoke. She has never used smokeless tobacco. She reports  that she does not drink alcohol and does not use drugs.     ROS:  Constitutional: Denies fever, chills or weight loss.  Respiratory: Denies wheezing or cough.  Cardiovascular: Denies chest pain or palpitations.  Gastrointestinal: Denies abdominal pain, nausea, vomiting, blood in stool.    Pertinent positives and negatives noted in HPI.    Objective:   Vitals:  BP (!) 146/93   Pulse 90   Temp 97.8 F (36.6 C) (Oral)   Resp 16   Ht 1.727 m (5\' 8" )   Wt 142.9 kg (315 lb)   LMP  (LMP Unknown)   SpO2 96%   BMI 47.90 kg/m     Physical Exam:  General appearance - Well developed and well nourished, no acute distress.  Respiratory - Non labored respirations, no audible wheezing.  Cardiovascular - Regular  rate and rhythm, no JVD, no LE edema.  Gastrointestinal - Soft, non-tender, non-distended, no masses, normal bowel sounds.   Neurologic - Alert and oriented to person, place and time.  No gross movement disorders noted.  Skin: Normal color and turgor, no rashes, no suspicious skin lesions noted.    Labs Reviewed:     Recent Labs     03/06/23  1355 02/06/23  0754 01/27/23  1352   WBC 9.73* 10.96* 12.51*   Hgb 13.7 14.1 15.0*   Hematocrit 40.3 41.9 44.8*   Platelets 360* 353* 458*       Recent Labs     02/06/23  0754 01/27/23  1352 04/22/18  1436   PT 11.2 11.3 12.1*   PT INR 1.0 1.0 0.9       Recent Labs     03/06/23  1355 02/20/23  1059 02/06/23  0754 01/27/23  1352 01/23/23  1335 01/23/23  1238 05/27/22  1001 01/14/22  0000 02/23/21  0743 05/18/20  0824 02/06/20  1244 07/26/19  1442   Sodium 139  --  139 137 140  --    < > 141   < > 139 140 139   Potassium 4.0  --  4.4 4.0 4.3  --    < > 5.1   < > 4.7 4.2 3.7   Chloride 108  --  107 102 107  --    < > 105   < > 101 104 104   CO2 25  --  22 25 24   --    < > 18*   < > 17* 24 23   BUN 8.0  --  10.0 12.0 9.0  --    < > 13   < > 11 8.0 15.2   Creatinine 0.7  --  0.9 0.8 0.8  --    < > 0.82   < > 0.74 0.8 0.8   Calcium 9.6  --  9.6 10.4 9.8  --    < > 9.8   < > 9.8 9.3 9.6   Albumin 3.9 4.7 4.0  --  4.1  --    < > 4.8   < > 4.4 4.2 4.1   Protein, Total 7.7  --  7.6  --  7.8  --    < > 7.4   < > 7.4 7.9 7.3   Bilirubin, Total 0.3  --  0.5  --  0.5  --    < > 0.4   < > 0.4 0.5 0.4   Alkaline Phosphatase 60  --  59  --  67  --    < >  62   < > 87 80 63   ALT 39  --  37  --  44  --    < > 28   < > 70* 63* 50   AST (SGOT) 30  --  28  --  28  --    < > 22   < > 59* 46* 23   Glucose 105*  --  109* 99 113*  --    < > 99   < > 113* 97 88   TSH  --   --   --   --   --  1.36  --  2.040  --   --  2.41  --    Ferritin  --   --   --   --   --   --   --   --   --  291*  --  176.64    < > = values in this interval not displayed.        Rads:   MRI Brain W WO Contrast    Result Date:  03/07/2023   No intracranial mass, acute hemorrhage, acute infarction, or evidence of hydrocephalus. Normal MRI of the brain. Electronically signed by: Brenton Grills M.D. [facility] MK: 03/07/23    FL Guid Lumbar Puncture Diagnostic    Result Date: 02/20/2023   1.Successful lumbar puncture under fluoroscopic guidance. Nelya Ebadirad 02/20/2023 4:17 PM     Assessment and Plan:      Problem List:  Patient Active Problem List   Diagnosis    Morbid obesity    Herpes labialis    Non-seasonal allergic rhinitis, unspecified trigger    Migraine with aura and without status migrainosus, not intractable    Post-cholecystectomy syndrome    History of Wolff-Parkinson-White (WPW) syndrome    Scoliosis deformity of spine    Behcet's disease    Immunocompromised state due to drug therapy    S/P hysterectomy    Premature ventricular contractions (PVCs) (VPCs)    Palpitations    NICM (nonischemic cardiomyopathy)    PVC's (premature ventricular contractions)    Abdominal pain, unspecified abdominal location    Seronegative rheumatoid arthritis    Anterior uveitis    Generalized abdominal pain    Speech abnormality    Post-dural puncture headache    Diarrhea, unspecified type    Nausea       Continue to egd/cln    Darlyn Chamber, M.D.  [facility] Hepatology Physician  Consults

## 2023-03-22 ENCOUNTER — Encounter: Payer: Self-pay | Admitting: Transplant Hepatology

## 2023-03-25 ENCOUNTER — Encounter (INDEPENDENT_AMBULATORY_CARE_PROVIDER_SITE_OTHER): Payer: Self-pay | Admitting: Internal Medicine

## 2023-03-25 ENCOUNTER — Encounter: Payer: Self-pay | Admitting: Neurology

## 2023-03-26 ENCOUNTER — Encounter: Payer: Self-pay | Admitting: Internal Medicine

## 2023-03-26 DIAGNOSIS — M352 Behcet's disease: Secondary | ICD-10-CM

## 2023-03-26 DIAGNOSIS — H209 Unspecified iridocyclitis: Secondary | ICD-10-CM

## 2023-03-26 DIAGNOSIS — M0600 Rheumatoid arthritis without rheumatoid factor, unspecified site: Secondary | ICD-10-CM

## 2023-03-27 ENCOUNTER — Ambulatory Visit
Admission: RE | Admit: 2023-03-27 | Discharge: 2023-03-27 | Disposition: A | Discharge status: Home / Self Care | Payer: BLUE CROSS/BLUE SHIELD | Source: Ambulatory Visit | Attending: Obstetrics and Gynecology | Admitting: Obstetrics and Gynecology

## 2023-03-27 DIAGNOSIS — Z1231 Encounter for screening mammogram for malignant neoplasm of breast: Secondary | ICD-10-CM | POA: Insufficient documentation

## 2023-03-27 LAB — LAB USE ONLY - HISTORICAL SURGICAL PATHOLOGY

## 2023-04-03 ENCOUNTER — Ambulatory Visit: Payer: BLUE CROSS/BLUE SHIELD

## 2023-04-05 ENCOUNTER — Encounter (INDEPENDENT_AMBULATORY_CARE_PROVIDER_SITE_OTHER): Payer: Self-pay | Admitting: Internal Medicine

## 2023-04-07 ENCOUNTER — Encounter (INDEPENDENT_AMBULATORY_CARE_PROVIDER_SITE_OTHER): Payer: Self-pay | Admitting: Internal Medicine

## 2023-04-07 ENCOUNTER — Telehealth (INDEPENDENT_AMBULATORY_CARE_PROVIDER_SITE_OTHER): Payer: BLUE CROSS/BLUE SHIELD | Admitting: Internal Medicine

## 2023-04-07 DIAGNOSIS — M352 Behcet's disease: Secondary | ICD-10-CM

## 2023-04-07 DIAGNOSIS — Z79899 Other long term (current) drug therapy: Secondary | ICD-10-CM

## 2023-04-07 DIAGNOSIS — H209 Unspecified iridocyclitis: Secondary | ICD-10-CM

## 2023-04-07 DIAGNOSIS — M0600 Rheumatoid arthritis without rheumatoid factor, unspecified site: Secondary | ICD-10-CM

## 2023-04-07 MED ORDER — METHOTREXATE SODIUM 2.5 MG PO TABS
5.0000 mg | ORAL_TABLET | ORAL | 1 refills | Status: DC
Start: 2023-04-07 — End: 2023-05-02

## 2023-04-07 MED ORDER — FOLIC ACID 1 MG PO TABS
1.0000 mg | ORAL_TABLET | Freq: Every day | ORAL | 2 refills | Status: DC
Start: 2023-04-07 — End: 2023-07-03

## 2023-04-07 NOTE — Progress Notes (Signed)
RHEUMATOLOGY FOLLOW UP TELEMEDICINE NOTE  Telemedicine Documentation Requirements  Originating site (Patient location): home  Distant site (Provider location): Office  Provider and Title: Dr. Adin Hector  Type of Visit: Video Visit  Verbal Consent has been obtained to conduct a telemedicine visit on this date in order to minimize exposure to COVID-19.        PCP: Virgilio Frees, MD    HPI:    This patient is a 42 y.o. year female with Past Medical History of Prior Cholecystectomy, Wold-Parkinson White, Prior Hysterectomy who returns for follow up of Behcet's HLAB51+, recurrent oral ulcers, pathergy, low grade fevers and seronegative arthritis.  -ablation procedure for arrythmia 06/2022  -She is on Humria weekly for inflammatory arthritis from Behcets  Henderson [place] for recurrent aphthous ulcers  -She is on dapsone from Dermatology, clindamycin at night  Dermatologist started her on dapsone-she has had a few scars that are healing-had injections from her dermatologist.  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 Shellee Milo      Interval History:  Last visit 02/08/23    She saw Dr. Drucilla Schmidt today-she says eye rpessure better, he is apering the rpednisone drops.  Reportedly Uveitis is doing better.    She saw Dr. Theodoro Grist 03/06/23-he is concerned about oligoclonal bands 4/5.  He is concerned this is Neuro-Behcets absed on oligocloncal bands.    She still ahs intermittent fevers, 101-she says it is doing better.    She is on prednisone 10 mg in the morning per Neurology.  She says this was started April 29.    She had seizures since our visit-started on depakote, 500 mg BID    She had her LP-post spinal tap HA-got blood patch-that helped    She had EGD with GI-Dr. Gracy Bruins    No joint swelling.    She just received her 1st dose Remicade 01/23/23, 2nd dose of TNF inhibitor 4/1, last infusion 4/29    Henderson Baltimore is OK, no mouth sores or genital ulcers.     Imaging Reports Reviewed:  03/01/23  MRI Brain:  FINDINGS:       The brain parenchyma demonstrates no significant abnormality. There is no  abnormal parenchymal enhancement. No parenchymal mass lesion, acute  parenchymal hemorrhage, or acute infarction.     The ventricles, sulci, and cisterns are unremarkable in appearance.     The flow voids of the major intracranial vessels appear intact.     The bones and extracranial soft tissues are unremarkable.     IMPRESSION:      No intracranial mass, acute hemorrhage, acute infarction, or evidence of  hydrocephalus. Normal MRI of the brain    02/10/23:  Neck US:  Narrative & Impression   HISTORY: Neck swelling. Enlarged cervical lymph nodes.      COMPARISON: 01/28/2023     TECHNIQUE: Targeted soft tissue ultrasound of the area of concern, in the  area of both parotid glands and at level 1.     FINDINGS:   No enlarged or otherwise abnormal appearing lymph node, other mass, fluid  collection, or other focal abnormality. The parotid glands appear symmetric  and normal in echotexture with no discrete mass.     IMPRESSION:      Normal exam         Labs Reviewed:  03/21/23  Surgical pathology A.  GASTRIC BIOPSY:          MINIMAL SUPERFICIAL CHRONIC INACTIVE INFLAMMATION, OTHERWISE     UNREMARKABLE; H. PYLORI  NOT IDENTIFIED ON H&E STAIN        B.  COLON RANDOM BIOPS:     UNREMARKABLE COLONIC MUCOSA     03/06/23  CRP wnl  ESR 35  CMP wnl  CBC unremarkable    Previous Labs:  02/06/23  CRP wnl  ESR 24  CMP wnl  CBC WBC 10.96    Previous Labs:  11/25/22  Procalcitonin wnl  CMP wnl  CBC unremarkable      Previous Labs:  08/29/22  CBC platelets 350  BMP unremarkable  ESR wnl  CRP wnl      Imaging Reports Reviewed:  12/16/22  MRI Lumbar Spine:  IMPRESSION:         1.  No acute osseous or soft tissue abnormality.  2.  Normal MR appearance of the distal spinal cord, conus medullaris and  cauda equina.  3.  Rotatory lumbar levoscoliosis.  4.  Lumbar spondylosis and facet arthrosis without central stenosis or  nerve root compression. Please see full details and  segmental analysis  above.    CT Chest Angio:  11/23/22  FINDINGS:      LINES/TUBES: None.     LUNGS: No consolidation, edema or mass. There is linear atelectasis at the  lung bases.  PLEURA: No pleural effusions or pneumothorax.     HEART: Not enlarged.  MEDIASTINUM: No axillary, hilar or mediastinal lymphadenopathy.  PULMONARY ARTERIES: No acute pulmonary emboli.  AORTA: No aneurysm or dissection.     UPPER ABDOMEN: Hepatic steatosis. No acute abnormality.     BONES AND SOFT TISSUES: Unremarkable.     IMPRESSION:      No acute pulmonary embolism.    CT A/P  11/23/22:  FINDINGS:      LINES/TUBES: None.     LOWER THORAX: Bibasilar atelectasis.     LIVER/BILIARY TREE: The gallbladder is absent. There is hepatic steatosis.  No biliary dilation.  SPLEEN: No splenomegaly.  PANCREAS: No pancreatic mass or duct dilatation.     KIDNEYS/URETERS: No hydronephrosis, stones or solid mass lesions.  ADRENALS: No adrenal mass.  PELVIC ORGANS/BLADDER: No pelvic masses. The bladder is partially  distended.     PERITONEUM/RETROPERITONEUM: No free air or fluid.  LYMPH NODES: No lymphadenopathy.  VESSELS: No aortic aneurysm.     GI TRACT: No bowel wall thickening or dilation. Normal appendix.     BONES AND SOFT TISSUES: There are degenerative changes in the spine. No  suspicious or destructive osseous lesion.     IMPRESSION:      No acute abnormality.    Previous Imaging:      09/19/22:  ECHO:  Left Ventricle    The left ventricle is normal in size. There is normal left ventricular  geometry. Left ventricular systolic function is normal with an ejection  fraction by Biplane Method of Discs of  60 %.  3D LVEF 58%. Left ventricular  segmental wall motion is normal. Left ventricular diastolic filling parameters  are consistent with Grade I diastolic dysfunction (impaired relaxation  pattern).  Right Ventricle    The right ventricular cavity size is normal in size. Normal right  ventricular systolic function.    08/27/22  MRI left  Hip:  IMPRESSION:      1.Trace left hip joint effusion without significant synovitis. Tear of the  left anterosuperior acetabular labrum. Otherwise unremarkable left hip  joint.     2.Mild to moderate left distal gluteus minimus tendinosis and mild to  moderate left greater trochanteric bursitis  with bursal edema. There is  mild left proximal hamstring tendinosis.      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     4.There has been interval enlargement of a left adnexal cyst since pelvic  ultrasound 03/18/2022, now measuring 3.3 cm. This could be further  evaluated with follow-up pelvic ultrasound.     5.Unchanged 3.8 cm cyst adjacent to the vaginal cuff post hysterectomy  since multiple prior exams favoring a benign etiology.      Previous Labs:  07/25/22  ESR 37  CRP wnl  CMP ALt 39  CBC wnl      Imaging Reports Reviewed:  04/12/22  Cardiac MRI:  IMPRESSION:       1. No delayed gadolinium enhancement to suggest myocardial fibrosis or  scarring.  2. Normal right ventricular systolic function.  3. Qualitatively mild global left ventricular hypokinesis. Left ventricular  systolic function measures 32% and may be artifactually reduced secondary  to significant motion artifact.    Labs Reviewed:  04/19/22  Throat culture no B-hemolytic strep  POCT flu A/B  Abbot -      Previous Labs:  01/14/22  CMP wnl  CRP wnl  ViT D 31  ESR wnl  Lipid panel wnl  TSH wnl      08/18/21  CBC wnl  CMP wnl  ESR wnl  CRP wnl    Previous Labs:  05/25/21  Quant Gold-  HepbSAb wnl  HepbSAg-  HepCAb-  HIV-        Previous Labs:  02/23/21  ESR wnl  CRP wnl  CMP ALT 42, AST normal    CRP wnl  ESR wnl  HLAB51 positive  IgE, IgA, IGG, IgM all wnl  CBC wnl    12/03/20  DIAGNOSIS:          A. Lesion, ulnar wrist, excision:     -Fibroadipose tissue and vasculature          B. Synovial tissue, biopsy:     -Fibroadipose tissue and synovium (see comment)          Comment:     Morphologic features typical of rheumatoid arthritis or  infection are     not identified.    12/03/20  Bacterial Cultures: No Growth  Fungal Cultures: NG at one week  AFB: No growth    11/20/20  ESR 50  CRP wnl  IgG subclasses wnl  ANA comprehensive panel negative        Previous Labs:    [facility] Labs:  10/23/20  C-ANCA, P-ANCA, MPO, PR3 all negative  Mitochondrial Ab-  ANA IFA-  Scl70-  Centromere-  RNP-  SMith-  TPPA Ab-      05/18/20  Ferritin 291  CMP AST/A:T 59/70  CBC elevated lymphocytes    02/06/20  MPO-  PR3-  SSA-  aCL IgM 14  aCL IgG, IgA -  Lupus anticoagulant -  B2IgG, IgM -  ACE wnl  CCP-  RF-  Vit D 31  TSH wnl  ESR wnl  CK wnl  C4 wnl  C3 wnl  CRP wnl    07/2019  ANA Screen-  LDH wnl    07/23/2018 lymph node biopsy:DIAGNOSIS:          Lymph node, deep right neck, excisional biopsy:     -Lymph node with non-specific reactive changes   Wound, AFB and Fungal cultures negative at this time    BONE MARROW 05/03/2019  BONE MARROW, LEFT POSTERIOR ILIAC CREST, CORE BIOPSY, CLOT SECTION,    ASPIRATE SMEAR AND TOUCH PREP:        - NORMOCELLULAR BONE MARROW (approximate50-60%) WITH TRILINEAGE HEMATOPOIESIS,  NORMAL MYELOID TO ERYTHROID RATIO AND ADEQUATE NUMBERS OF    MEGAKARYOCYTES WIT H UNREMARKABLE MORPHOLOGY        - A FEW SMALL REACTIVE LYMPHOID AGGREGATES IDENTIFIED, AND MILDLY  INCREASED NUMBERS OF EOSINOPHILS        - PENDING CYTOGENETIC STUDIES OF BONE MARROW SAMPLE  - Remain pending as of 05/14/2019.        COMMENT:    The concurrent flow cytometry performed on the bone marrow aspirate  sample shows no evidence of immunophenotypic abnormalities      Imaging Reviewed:    08/16/20  MRI Brain:IMPRESSION:      1. No intracranial mass, hemorrhage, or hydrocephalus is detected.  2. There is normal pattern of enhancement of the brain parenchyma and  meningeal surfaces.    9/18 MRI Abdomen:  IMPRESSION:      1. Diffuse hepatic steatosis. No suspicious focal hepatic lesions.  2. No biliary ductal dilatation or choledocholithiasis.    02/03/20  MRI Pelvis:  IMPRESSION:          Cystic structure at the vaginal cuff, flank by both ovaries not  significantly changed in size compared to 2019. No abnormal internal  enhancement. This favors a benign process such as a peritoneal inclusion  cyst or a paraovarian cyst.    12/14/19 MRI R Hand:  IMPRESSION:      1.  No suspicious soft tissue mass, osseous mass, or masslike  enhancement in the right hand or wrist to correlate with the diffuse FDG  uptake on recent PET/CT.  2.  There is mild enhancing synovitis in the right wrist joint without  active joint erosion or bony destruction.  3.  A small dorsal ganglion cyst overlying the scapholunate interval.    12/06/19  Full Body PET Scan:    Hypermetabolic soft tissue mass in palmar R hand surface at base of the second digit-stable pulmonary nodulke 1.3 cm left lower lobe-recommend follow up Chest CT 3 months         The following sections were reviewed this encounter by the provider:   Tobacco  Allergies  Meds  Problems  Med Hx  Surg Hx  Fam Hx         PMH/PSH:  Past Medical History:   Diagnosis Date    Abnormal vision     reading glasses    Anxiety     Arrhythmia     WPW ablation in 2005, 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 Released from cardiology    Behcet's disease     She sees Dr. 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    02/11/23 stable on medical    Diarrhea     Ear, nose and throat disorder     seasonal allergies    Fatigue     Bechet's    Fever 11/09/2022    Headache     migraine    Hx of ovarian cyst     Lower back pain     not severe    Meningitis 2011    Viral // treated and resolved    Pain in wrist 11/09/2022    Post-operative nausea and vomiting     Scoliosis     moderate// used a Brace at a young age  Uveitis     using eyedrops        Past Surgical History:   Procedure Laterality Date    ABLATION - PVC'S N/A 06/08/2022    Procedure: Ablation - PVC's;  Surgeon: [name], MD;  Location: [facility];  Service: Cardiovascular;  Laterality: N/A;  SAME DAY DISCHARGE  CARTO NO TEE     ARTHROTOMY, WRIST Right 12/03/2020    Procedure: RIGHT UPPER EXTREMITY SYNOVIAL BIOPSY;  Surgeon: Kellie Simmering, MD;  Location: [facility];  Service: Plastics;  Laterality: Right;    BIOPSY, LYMPH NODE N/A 07/23/2018    Procedure: BIOPSY, LYMPH NODE;  Surgeon: Shelda Pal, MD;  Location: [facility];  Service: ENT;  Laterality: N/A;  NECK DEEP LYMPH NODE BIOPSY    BONE MARROW BIOPSY  2020    led to behcet's dx    CARDIAC ABLATION  2005    [facility] for Wolff-Parkinson-White    CHOLECYSTECTOMY  2009    COLONOSCOPY, DIAGNOSTIC (SCREENING) N/A 03/21/2023    Procedure: COLONOSCOPY, DIAGNOSTIC (SCREENING);  Surgeon: Darlyn Chamber, MD;  Location: [facility];  Service: Gastroenterology;  Laterality: N/A;    DILATION AND CURETTAGE OF UTERUS  2010    EGD N/A 03/21/2023    Procedure: EGD;  Surgeon: Darlyn Chamber, MD;  Location: [facility];  Service: Gastroenterology;  Laterality: N/A;    FINGER GANGLION CYST EXCISION  04/2018    GANGLION CYST EXCISION  2014    04/2018 cyst removed from index finger-[facility]    HYSTERECTOMY  2012    HYSTEROSCOPY  2011    indication: Abnormal bleeding    LAPAROSCOPIC, LYSIS, ADHESIONS N/A 11/09/2022    Procedure: LAPAROSCOPIC LYSIS OF ADHESIONS;  Surgeon: Devota Pace, MD;  Location: [facility];  Service: Gynecology;  Laterality: N/A;    LAPAROSCOPIC, SALPINGECTOMY Left 11/09/2022    Procedure: LAPAROSCOPIC, SALPINGECTOMY LEFT;  Surgeon: Devota Pace, MD;  Location: [facility];  Service: Gynecology;  Laterality: Left;    LAPAROSCOPY, DIAGNOSTIC N/A 11/09/2022    Procedure: LAPAROSCOPY, DIAGNOSTIC;  Surgeon: Devota Pace, MD;  Location: [facility];  Service: Gynecology;  Laterality: N/A;  [id number]    UMBILICAL HERNIA REPAIR  2016    UPPER GASTROINTESTINAL ENDOSCOPY  2009    WISDOM TOOTH EXTRACTION  1998        FH/SH:  Family History   Adopted: Yes       Social History     Tobacco Use    Smoking status: Never      Passive exposure: Never    Smokeless tobacco: Never   Vaping Use    Vaping status: Never Used   Substance Use Topics    Alcohol use: Never    Drug use: Never        Meds/ Allergies:  No outpatient medications have been marked as taking for the 04/07/23 encounter (Telemedicine Visit) with Baltazar Najjar, MD.     Allergies   Allergen Reactions    Fruit Blend Flavor [Flavoring Agent] Anaphylaxis and Swelling     Tropical Fruit     Latex Hives, Swelling, Other (See Comments) and Drug-Induced Flushing    Adhesive [Wound Dressing Adhesive] Itching    Cyanoacrylate Itching    Other Itching and Rash     Sutures Monocryl    Oxycodone Hallucinations          PHYSICAL EXAM  General- WNWD, alert and oriented, NAD  Eyes- EOMI,  PERRL, no conjunctival injection, no scleral icterus  ENMT- face symmetric without lesions  Skin- no rash, no alopecia  Pulm: No conversational dyspnea, breathing comfortable on room air  Neuro - no notable neurological deficits, no facial asymmetry        LABS:  Lab Results   Component Value Date    WBC 9.73 (H) 03/06/2023    HGB 13.7 03/06/2023    HCT 40.3 03/06/2023    MCV 92.2 03/06/2023    PLT 360 (H) 03/06/2023      Lab Results   Component Value Date    CREAT 0.7 03/06/2023    BUN 8.0 03/06/2023    NA 139 03/06/2023    K 4.0 03/06/2023    CL 108 03/06/2023    CO2 25 03/06/2023      Lab Results   Component Value Date    ALT 39 03/06/2023    AST 30 03/06/2023    ALKPHOS 60 03/06/2023    BILITOTAL 0.3 03/06/2023     Lab Results   Component Value Date    ESR 35 (H) 03/06/2023    ESR 24 (H) 02/06/2023    ESR 25 (H) 01/27/2023    ESR 28 (H) 01/23/2023    ESR 31 (H) 01/10/2023      Lab Results   Component Value Date    CRP <0.1 03/06/2023    CRP <0.1 02/06/2023    CRP <0.1 01/27/2023    CRP 0.1 01/23/2023    CRP <0.1 01/10/2023      No results found for: "ANA"   Lab Results   Component Value Date    RHEUMFACTOR <15.0 02/06/2020      Lab Results   Component Value Date    CCP <15.6 02/06/2020     No results  found for: "URICACID"        ASSESSMENT/PLAN:    This patient is a 42 y.o. year female with Past Medical History of Prior Cholecystectomy, Wolf-Parkinson White, Prior Hysterectomy, Fatty Liver Disease who returns for follow up of Behcet's Disease and seronegative rheumatoid arthritis.  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    Just transitioned to Remicade-on 5 mg/kg and will maintain on this for now.    She is following with neurology-supects neuro behcets as well-has her on prednisone qAM.    Will add methotrexate 2 tabelts weekly and daily folic acid to rpevent loss of efficacy of Remicade.    She is following with ophtho for uveitis-recent exam reportedly normal with Dr. Helane Rima are tapering rpednisone drops.    She will eventually need baseline pulmonary evaluation and PFTs-prolonged recovery from pneumonia, rule out obstructive disease.    Neck US for mild cervical LAD-normal    Pursuing work up with GI, EGD reportedly normal.    Patient understands to take MTX weekly and not on an as needed basis. Patient also understands to take Folic Acid daily. Possible adverse effects of MTX were discussed with patient which include but are not limited to: nausea/vomiting/oral ulcers, bone marrow suppression, hypersensitivity pneumonitis, renal impairment, hepatotoxicity, opportunistic infections    Will check high risk emdication monitoring labd in 3-4 weeks      1. Behcet's disease  - CBC and differential; Future  - Comprehensive metabolic panel; Future  - Sedimentation rate (ESR); Future  - C Reactive Protein; Future  - methotrexate 2.5 MG tablet; Take 2 tablets (5 mg) by mouth once a week  Dispense: 8 tablet; Refill: 1  - folic acid (FOLVITE)  1 MG tablet; Take 1 tablet (1 mg) by mouth daily  Dispense: 30 tablet; Refill: 2    2. High risk medication use  - CBC and differential; Future  - Comprehensive metabolic panel; Future  - Sedimentation rate (ESR); Future  - C Reactive Protein; Future  - methotrexate 2.5  MG tablet; Take 2 tablets (5 mg) by mouth once a week  Dispense: 8 tablet; Refill: 1  - folic acid (FOLVITE) 1 MG tablet; Take 1 tablet (1 mg) by mouth daily  Dispense: 30 tablet; Refill: 2    3. Seronegative rheumatoid arthritis  - CBC and differential; Future  - Comprehensive metabolic panel; Future  - Sedimentation rate (ESR); Future  - C Reactive Protein; Future  - methotrexate 2.5 MG tablet; Take 2 tablets (5 mg) by mouth once a week  Dispense: 8 tablet; Refill: 1  - folic acid (FOLVITE) 1 MG tablet; Take 1 tablet (1 mg) by mouth daily  Dispense: 30 tablet; Refill: 2    4. Uveitis  - CBC and differential; Future  - Comprehensive metabolic panel; Future  - Sedimentation rate (ESR); Future  - C Reactive Protein; Future  - methotrexate 2.5 MG tablet; Take 2 tablets (5 mg) by mouth once a week  Dispense: 8 tablet; Refill: 1  - folic acid (FOLVITE) 1 MG tablet; Take 1 tablet (1 mg) by mouth daily  Dispense: 30 tablet; Refill: 2                             Total Time spent reviewing records, interviewing patient and coordinating plan of care: 44 min    FOLLOW UP:  No follow-ups on file.                                                                                  Debe Coder, MD  Rheumatologist  [facility]  [address]  [place], [place] [address]  Phone: [phone]  Fax: [phone]

## 2023-04-07 NOTE — Patient Instructions (Signed)
Dear Hailey [name] ,     Thanks for arranging a video visit with me.    Here are things I'd like you to do following today's visit:  Please have blood taken for labs in 3-4 weeks  Medication instructions:  -Please start methotrexate 2 tablets ocne weekly  -Please start folic acid 1 mg daily  -Please continue Otezla 30 mg BID      You may retrieve any orders from this visit in Mychart by using these steps:    After signing into Mychart  1) click Messaging  2) click Letters  3) select the Requisition  4) Review and click print    Please contact the clinic at [phone] with any questions and to schedule your follow up visit.    Sincerely,    Debe Coder, MD  Rheumatologist  [facility]  [address]  [place], [place] [address]  Phone: [phone]  Fax: [phone]

## 2023-04-11 ENCOUNTER — Telehealth (INDEPENDENT_AMBULATORY_CARE_PROVIDER_SITE_OTHER): Payer: BLUE CROSS/BLUE SHIELD | Admitting: Residents

## 2023-04-11 ENCOUNTER — Encounter (INDEPENDENT_AMBULATORY_CARE_PROVIDER_SITE_OTHER): Payer: Self-pay | Admitting: Residents

## 2023-04-11 VITALS — Ht 68.0 in | Wt 315.0 lb

## 2023-04-11 DIAGNOSIS — R1084 Generalized abdominal pain: Secondary | ICD-10-CM

## 2023-04-11 DIAGNOSIS — R194 Change in bowel habit: Secondary | ICD-10-CM

## 2023-04-11 DIAGNOSIS — Z79899 Other long term (current) drug therapy: Secondary | ICD-10-CM

## 2023-04-11 DIAGNOSIS — M352 Behcet's disease: Secondary | ICD-10-CM

## 2023-04-11 MED ORDER — DICYCLOMINE HCL 10 MG PO CAPS
10.0000 mg | ORAL_CAPSULE | Freq: Four times a day (QID) | ORAL | 2 refills | Status: DC | PRN
Start: 2023-04-11 — End: 2023-04-26

## 2023-04-11 NOTE — Progress Notes (Unsigned)
New Outpatient Clinic Visit     Date of Visit: 04/11/2023 1:50 PM  Referring Physician:       CC: Diarrhea   HPI: Hailey [name] is a 42 y.o. female with h/o Behcet's who was referred to GI Clinic for diarrhea.    Prior GI symptoms:  - On Humira for Behcet's disease. Switching to Remicade.   - Diagnosed 2 years ago w/ Behcet's disease, sx started 5 years ago.     - Diarrhea- 3-6 day periods of loose stools 5/day. Every 6 weeks. Has woken up in the middle of the night to have a BM. Consistency is loose/watery. Not dependent on food- tried cutting out gluten, dairy, FODMAPs. Has noted blood in the past w/ mucus. Some rectal pain/cramp with repeated stools. Started four years ago. Not sure if related to when becoming due for another Humira dose.   - When not having above episodes, BM 1-2/day, semi-formed  - Hx of CCY 2009 for nonspecific GI sx, no gallstones  - Welchol 2 tabs in the am beg 2009  - Rare Advil use    - Rare heartburn, regurgitation  - No dysphagia, odynophagia  - Retching and bringing up bilious fluid approx 1 /month  - EGD before [facility]Y in 2009    - Adopted, no fam hx     Current GI sx:  - EGD/colonoscopy- gastric erythema, large ext hemorrhoids. Path: gastric bx nl, colonoscopy bx nl  - Started having seizures for the first time. Started on Depakote  - LP and f/u blood patch - neuro Behcet's  - Restarted prednisone in addition to Remicade. Also started MTX/folic acid.   - 1-2 BM/day. Occasionally will have 3-4 BM/day  - Less loose stools since starting Depakote  - Tried dicyclomine 10 mg TID, works 1/2 the time  - Nauseated and throw up, non-bloody emesis. Thinks related to medicine. 1-2/month.  - Has seen blood in the stool in the past but not recently. Doesn't feel hemorrhoids are a problem right now.     -   - Reconsideration of colonoscopy for CRC screening in 5 years-   - Dicyclomine 20 mg QID  - Follow up in 4 months      PMH:   Past Medical History:   Diagnosis Date    Abnormal vision      reading glasses    Anxiety     Arrhythmia     WPW ablation in 2005, 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 Released from cardiology    Behcet's disease     She sees Dr. 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    02/11/23 stable on medical    Diarrhea     Ear, nose and throat disorder     seasonal allergies    Fatigue     Bechet's    Fever 11/09/2022    Headache     migraine    Hx of ovarian cyst     Lower back pain     not severe    Meningitis 2011    Viral // treated and resolved    Pain in wrist 11/09/2022    Post-operative nausea and vomiting     Scoliosis     moderate// used a Brace at a young age    Uveitis     using eyedrops       PSH:   Past Surgical History:   Procedure Laterality Date    ABLATION - PVC'S N/A 06/08/2022  Procedure: Ablation - PVC's;  Surgeon: [name], MD;  Location: [facility];  Service: Cardiovascular;  Laterality: N/A;  SAME DAY DISCHARGE  CARTO NO TEE    ARTHROTOMY, WRIST Right 12/03/2020    Procedure: RIGHT UPPER EXTREMITY SYNOVIAL BIOPSY;  Surgeon: Kellie Simmering, MD;  Location: [facility];  Service: Plastics;  Laterality: Right;    BIOPSY, LYMPH NODE N/A 07/23/2018    Procedure: BIOPSY, LYMPH NODE;  Surgeon: Shelda Pal, MD;  Location: [facility];  Service: ENT;  Laterality: N/A;  NECK DEEP LYMPH NODE BIOPSY    BONE MARROW BIOPSY  2020    led to behcet's dx    CARDIAC ABLATION  2005    [facility] for Wolff-Parkinson-[facility]    CHOLECYSTECTOMY  2009    COLONOSCOPY, DIAGNOSTIC (SCREENING) N/A 03/21/2023    Procedure: COLONOSCOPY, DIAGNOSTIC (SCREENING);  Surgeon: Darlyn Chamber, MD;  Location: [facility];  Service: Gastroenterology;  Laterality: N/A;    DILATION AND CURETTAGE OF UTERUS  2010    EGD N/A 03/21/2023    Procedure: EGD;  Surgeon: Darlyn Chamber, MD;  Location: [facility];  Service: Gastroenterology;  Laterality: N/A;    FINGER GANGLION CYST EXCISION  04/2018    GANGLION CYST EXCISION  2014    04/2018 cyst [facility] from index  finger-[facility]    HYSTERECTOMY  2012    HYSTEROSCOPY  2011    indication: Abnormal bleeding    LAPAROSCOPIC, LYSIS, ADHESIONS N/A 11/09/2022    Procedure: LAPAROSCOPIC LYSIS OF ADHESIONS;  Surgeon: Devota Pace, MD;  Location: [facility];  Service: Gynecology;  Laterality: N/A;    LAPAROSCOPIC, SALPINGECTOMY Left 11/09/2022    Procedure: LAPAROSCOPIC, SALPINGECTOMY LEFT;  Surgeon: Devota Pace, MD;  Location: [facility];  Service: Gynecology;  Laterality: Left;    LAPAROSCOPY, DIAGNOSTIC N/A 11/09/2022    Procedure: LAPAROSCOPY, DIAGNOSTIC;  Surgeon: Devota Pace, MD;  Location: [facility];  Service: Gynecology;  Laterality: N/A;  [id number]    UMBILICAL HERNIA REPAIR  2016    UPPER GASTROINTESTINAL ENDOSCOPY  2009    WISDOM TOOTH EXTRACTION  1998       Current Medications:  Outpatient Medications Marked as Taking for the 04/11/23 encounter (Telemedicine Visit) with Horris Latino, MD   Medication Sig Dispense Refill    Apremilast (Otezla) 30 MG Tab Take 1 tablet (30 mg) by mouth 2 (two) times daily 180 tablet 1    Azelastine-Fluticasone 137-50 MCG/ACT Suspension 1 spray by Nasal route 2 (two) times daily      botulinum toxin type A (Botox) 200 units injection Inject 155 units IM every 12 weeks 1 each 3    busPIRone (BUSPAR) 5 MG tablet Take 1 tablet (5 mg) by mouth 2 (two) times daily 180 tablet 3    clindamycin (CLEOCIN T) 1 % lotion Apply topically every morning      colesevelam (WELCHOL) 625 MG tablet Take 2 tablets (1,250 mg) by mouth every morning 180 tablet 3    Dapsone 7.5 % Gel Apply topically every morning      diazePAM (VALIUM) 5 MG tablet Take 45 min prior to mri. Take an extra tab if needed 2 tablet 0    dicyclomine (BENTYL) 10 MG capsule Take 1 capsule (10 mg) by mouth 4 (four) times daily as needed (abdominal pain) 30 capsule 2    divalproex, DR, delayed release (DEPAKOTE) 500 MG EC tablet Take 1 tablet (500 mg) by mouth 2 (  two) times daily 60 tablet 2    eletriptan (RELPAX) 40  MG tablet Take 1 tablet (40 mg) by mouth as needed for Migraine .  Wait at least 2 hours prior to taking second dose.  Max 2 tabs/24 hours. Do not use more than 3 days per week. 12 tablet 5    EPINEPHrine (EPIPEN 2-PAK) 0.3 MG/0.3ML Solution Auto-injector injection Inject 0.3 mLs (0.3 mg) into the muscle once as needed (Anaphylaxis) 2 each 2    folic acid (FOLVITE) 1 MG tablet Take 1 tablet (1 mg) by mouth daily 30 tablet 2    hydrOXYzine (ATARAX) 10 MG tablet Take 1 tablet (10 mg) by mouth nightly 90 tablet 3    inFLIXimab 100 MG injection Infuse into the vein once every eight weeks      lidocaine-prilocaine (EMLA) cream Apply topically as needed Dermatology procedures      methotrexate 2.5 MG tablet Take 2 tablets (5 mg) by mouth once a week 8 tablet 1    Multiple Vitamin (MULTIVITAMIN PO) Take by mouth      ondansetron (ZOFRAN-ODT) 4 MG disintegrating tablet Take 1 tablet (4 mg) by mouth every 8 (eight) hours as needed for Nausea 30 tablet 2    predniSONE (DELTASONE) 10 MG tablet Take 1 tablet (10 mg) by mouth daily 30 tablet 5    Rimegepant Sulfate 75 MG Tablet Dispersible Take 1 tablet (75 mg) by mouth every other day For migraine prevention 16 tablet 11    valACYclovir (VALTREX) 1000 MG tablet Take 2 tabs at onset; repeat once in 12 hours.  Total 4 tabs per outbreak 12 tablet 3    vitamin B-12 1000 MCG tablet Take 1 tablet (1,000 mcg) by mouth daily 30 tablet 0    VITAMIN D PO Take by mouth         Allergies:  Allergies   Allergen Reactions    Fruit Blend Flavor [Flavoring Agent] Anaphylaxis and Swelling     Tropical Fruit     Latex Hives, Swelling, Other (See Comments) and Drug-Induced Flushing    Adhesive [Wound Dressing Adhesive] Itching    Cyanoacrylate Itching    Other Itching and Rash     Sutures Monocryl    Oxycodone Hallucinations       Family History:   Colon cancer- None    Family History   Adopted: Yes       Social History:  Nonsmoker  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  Eyes: Denies vision or eye problems.   ENMT: Denies hearing or ear problem, nasal discharge, oral lesions or sore throat.  Respiratory: Denies wheezing or cough.  Cardiovascular: Denies chest pain or palpitations.  Gastrointestinal: See HPI.  Genitourinary: Denies urinary symptoms or problems.  Musculoskeletal: Denies back or joint pain or problem.  Neurologic: Denies slurred speech, hemiplegia or focal deficit.  Psychiatric: Denies depression or anxiety.   Integumentary:  Denies skin rashes or jaundice.    Physical Exam:  Ht 1.727 m (5\' 8" )   Wt 142.9 kg (315 lb)   LMP  (LMP Unknown)   BMI 47.90 kg/m        03/10/2023     1:21 PM 03/21/2023     9:07 AM 04/11/2023     1:20 PM   Weight Monitoring   Height 172.7 cm 172.7 cm 172.7 cm   Height Method  Stated  Weight 142.883 kg 142.883 kg 142.883 kg   Weight Method  Stated    BMI (calculated) 47.9 kg/m2 47.9 kg/m2 47.9 kg/m2       General: Well developed and well nourished, no acute distress.  Eyes: Sclera anicteric, conjunctiva normal and no ptosis.  HENT:  Nose and ears appear normal.  Oropharynx clear.  Chest/Lungs: No respiratory distress or abnormal audible sound.   Heart: Normal rate, regular rhythm.   Abdomen: Soft, non-distended with bowel sounds. No tenderness to palpation. No mass/organomegaly.  Musculoskeletal: No gross deformity, tenderness, swelling.  Extremities:  No edema, cyanosis.  Skin: Normal color and turgor, no rashes, no suspicious skin lesions noted.  Neurologic: Alert and oriented.  No obvious focal deficits.  Psychiatric: Cooperative, appropriate mood, normal affect.    Labs Reviewed:  Recent Labs     03/06/23  1355 02/06/23  0754 01/27/23  1352   WBC 9.73* 10.96* 12.51*   Hgb 13.7 14.1 15.0*   Hematocrit 40.3 41.9 44.8*   Platelets 360* 353* 458*       Recent Labs     02/06/23  0754 01/27/23  1352 04/22/18  1436   PT 11.2 11.3 12.1*   PT INR 1.0 1.0 0.9       Recent Labs      03/06/23  1355 02/20/23  1059 02/06/23  0754 01/27/23  1352 01/23/23  1335 01/23/23  1238 05/27/22  1001 01/14/22  0000 02/23/21  0743 05/18/20  0824 02/06/20  1244 07/26/19  1442   Sodium 139  --  139 137 140  --    < > 141   < > 139 140 139   Potassium 4.0  --  4.4 4.0 4.3  --    < > 5.1   < > 4.7 4.2 3.7   Chloride 108  --  107 102 107  --    < > 105   < > 101 104 104   CO2 25  --  22 25 24   --    < > 18*   < > 17* 24 23   BUN 8.0  --  10.0 12.0 9.0  --    < > 13   < > 11 8.0 15.2   Creatinine 0.7  --  0.9 0.8 0.8  --    < > 0.82   < > 0.74 0.8 0.8   Calcium 9.6  --  9.6 10.4 9.8  --    < > 9.8   < > 9.8 9.3 9.6   Albumin 3.9 4.7 4.0  --  4.1  --    < > 4.8   < > 4.4 4.2 4.1   Protein, Total 7.7  --  7.6  --  7.8  --    < > 7.4   < > 7.4 7.9 7.3   Bilirubin, Total 0.3  --  0.5  --  0.5  --    < > 0.4   < > 0.4 0.5 0.4   Alkaline Phosphatase 60  --  59  --  67  --    < > 62   < > 87 80 63   ALT 39  --  37  --  44  --    < > 28   < > 70* 63* 50   AST (SGOT) 30  --  28  --  28  --    < > 22   < > 59* 46* 23  Glucose 105*  --  109* 99 113*  --    < > 99   < > 113* 97 88   TSH  --   --   --   --   --  1.36  --  2.040  --   --  2.41  --    Ferritin  --   --   --   --   --   --   --   --   --  291*  --  176.64    < > = values in this interval not displayed.        No results found for: "BNP"    Lab Results   Component Value Date    FERRITIN 291 (H) 05/18/2020       Lab Results   Component Value Date    VITD 28 (L) 01/23/2023       Lab Results   Component Value Date    TSH 1.36 01/23/2023       Imaging/Procedures Reviewed:  Mammo Screening 3D/Tomo Bilateral    Result Date: 03/28/2023  No mammographic evidence of malignancy.     A LETTER WAS GENERATED TO THE PATIENT INDICATING THESE RESULTS.                                                             RECOMMENDATION:  SOCIETY OF BREAST IMAGING AND AMERICAN COLLEGE OF RADIOLOGY RECOMMENDATIONS FOR IMAGING SCREENING FOR BREAST CANCER  " Yearly screening mammogram from age  31* * for women at average risk of breast cancer. To see the full SBI/ACR recommendations go to http://www.mammographysaveslives.org/Facts/Guidelines BIRADS CATEGORY 01-NEGATIVE MAMMOGRAM (1A) Elon Alas MD 03/28/2023 4:32 PM    Results for orders placed during the hospital encounter of 10/11/22    CT Abdomen Pelvis W IV/ WO PO Cont    Narrative  HISTORY: Left lower quadrant pain. Concern for diverticulitis. Known  vaginal cyst.    COMPARISON: 09/01/2018.    TECHNIQUE: CT of the abdomen and pelvis performed with intravenous  contrast. The following dose reduction techniques were utilized: automated  exposure control and/or adjustment of the mA and/or KV according to patient  size, and the use of an iterative reconstruction technique.    CONTRAST: iohexol (OMNIPAQUE) 350 MG/ML injection 100 mL    FINDINGS:    Abdomen:  The liver demonstrates mild steatosis. There are no focal liver lesions.  Gallbladder is surgically absent with normal caliber bile ducts. Spleen,  pancreas, adrenal glands, and kidneys are unremarkable. There are no renal  calculi and there is no hydronephrosis.    There is no abdominal or retroperitoneal lymphadenopathy. There is no  ascites. Abdominal aorta and IVC are normal caliber and patent. There are  no abdominal wall hernias.    The stomach is non-distended. There is no appreciable small bowel  thickening or obstruction. Appendix is normal. Colon is non-distended.  There is no colitis, diverticulitis, abscess, or free air.    Pelvis:  The uterus is surgically absent. Cyst near the upper vagina now measures  3.8 cm. Bladder is decompressed. Deep pelvic veins are patent. There are no  inguinal hernias.    Skeletal:  There is mild degenerative arthritis and minimal levoscoliosis in the  lumbar spine. Minimal osteoarthritis is seen at  the left SI joint. There  are no compression fractures.    Lower chest:  Lung bases are clear. There are no pleural effusions. Heart is normal  size.    Impression  1. No detectable acute abnormalities.  2. No bowel obstruction or detectable bowel inflammation; no  diverticulitis.  3. No renal calculi or hydronephrosis.  4. Remainder as above.    Wilmon Pali, MD  10/11/2022 5:24 PM     Results for orders placed during the hospital encounter of 07/24/20    MRI Abdomen W WO Contrast    Narrative  [facility]    FERRAH, MCCLERNON PERFORMED AT:  [id number]       DOB:[birth year]                 [address]  07/24/2020     AGE:34   Gender:F              Physicians Only: [phone]        Synetta Shadow MD                                  [F]  [address]  [place], [place] [id number]        MRI ABDOMEN MRCP WITHOUT AND WITH CONTRAST    HISTORY: Fever of unknown origin. Elevated liver enzymes. Evaluate for  biliary disease or liver mass.    COMPARISON: PET/CT dated 12/03/2019    TECHNIQUE: Multisequence MR imaging of the abdomen utilizing T1 and T2  weighted imaging including postcontrast LAVA and MRCP sequences. 3-D/MIP  images were reviewed.    Contrast: 10 cc GADAVIST administered.    FINDINGS:    Diffuse hepatic steatosis with a small area of fat sparing in inferior  segment 4. No focal suspicious hepatic lesions. Patent portal veins. No  biliary duct dilatation or choledocholithiasis. Status post  cholecystectomy.    Unremarkable spleen, pancreas, adrenals, and kidneys. No hydronephrosis.  Normal aortic caliber. No enlarged lymph nodes in the abdomen. Nondistended  bowel. No acute infectious/inflammatory process in the abdomen.    Impression  1. Diffuse hepatic steatosis. No suspicious focal hepatic lesions.  2. No biliary ductal dilatation or choledocholithiasis.    Electronically signed by: Carlynn Spry M.D.  [Interpreted at: 02WDB]  [facility]    EDH: 07/25/20       Assessment and Plan:  - Behcet's disease on Humira, now  switching to Remicade  - Diarrhea w/ rectal bleeding occurring episodically, no hx colonoscopy. Adopted- no known fam hx.  - Occasional bilious emesis     - EGD for further evaluation  - Colonoscopy w/ random colon bx to r/o microscopic colitis    The risks and benefits of my recommendations, as well as other treatment options were discussed with the patient today. Time given to ask questions and all questions were answered. Patient strongly encouraged to set up follow up and reach out regarding any questions.     Follow up: RTC EGD/colonoscopy    Letter sent to referring provider: Yes    Horris Latino, M.D.  [facility]  Department of Medicine  [facility]

## 2023-04-12 DIAGNOSIS — R194 Change in bowel habit: Secondary | ICD-10-CM | POA: Insufficient documentation

## 2023-04-12 DIAGNOSIS — Z79899 Other long term (current) drug therapy: Secondary | ICD-10-CM | POA: Insufficient documentation

## 2023-04-12 MED ORDER — DICYCLOMINE HCL 20 MG PO TABS
20.0000 mg | ORAL_TABLET | Freq: Four times a day (QID) | ORAL | 3 refills | Status: DC
Start: 2023-04-12 — End: 2023-07-09

## 2023-04-26 ENCOUNTER — Encounter: Payer: Self-pay | Admitting: Neurology

## 2023-04-26 ENCOUNTER — Ambulatory Visit: Payer: BLUE CROSS/BLUE SHIELD | Attending: Neurology | Admitting: Neurology

## 2023-04-26 VITALS — BP 136/92 | HR 102 | Temp 98.2°F | Resp 16 | Ht 68.0 in | Wt 320.0 lb

## 2023-04-26 DIAGNOSIS — G43711 Chronic migraine without aura, intractable, with status migrainosus: Secondary | ICD-10-CM

## 2023-04-26 DIAGNOSIS — H532 Diplopia: Secondary | ICD-10-CM

## 2023-04-26 DIAGNOSIS — M352 Behcet's disease: Secondary | ICD-10-CM

## 2023-04-26 DIAGNOSIS — R569 Unspecified convulsions: Secondary | ICD-10-CM

## 2023-04-26 MED ORDER — LACOSAMIDE 200 MG PO TABS
200.0000 mg | ORAL_TABLET | Freq: Every evening | ORAL | 1 refills | Status: DC
Start: 2023-04-26 — End: 2023-05-25

## 2023-04-26 NOTE — Progress Notes (Signed)
[facility]  MULTIPLE SCLEROSIS & NEUROIMMUNOLOGY CENTER  Main Line / Scheduling / on-call: [phone]  Clinic Tel: [phone]  Fax: [phone]  Send [place] a mychart or inbasket message for the fastest response  ~~~~~~~~~~~~~~~~~~~~~~~~~~~~~~~~~~~~~~~~~~~~~~~~~~~~~~~~~~~~~~~~~~~~~~~~~~~~~~~~~~~~    CC: CNS Behcet's    HPI:   History was obtained from records review, patient,     42 y.o. year-old female with clinically established Behcet's (arthralgia, sialadenitis /sicca, +HLAB51, +pathergy, PET-pos adenopathy) on remicade, otezla and prednisone, and now MTX 5mg  (planned increase to 10mg ).     Still with diplopia, not worsening or getting better  Still having partial seizures - brief memory and attention blanks with amnesia  Uveitis is better  Still with cognitive issues    Still lot of migraines; sensitive to bright lights; uses rescue meds 1x a week on average. Already on nurtec, botox. Occasional fortification spectrum. Gets brief episodes of blurred vision q1-2 days.     EXAM  Visit Vitals  BP (!) 136/92 (BP Site: Left arm, Patient Position: Sitting, Cuff Size: Large)   Pulse (!) 102   Temp 98.2 F (36.8 C) (Oral)   Resp 16   Ht 1.727 m (5\' 8" )   Wt 145.2 kg (320 lb)   LMP  (LMP Unknown)   SpO2 96%   BMI 48.66 kg/m     General:   Fundoscopic:   Psychiatric.   Mental Status: The patient was awake, alert, appropriate  Cranial Nerves: CN II-XII with left gaze dysconjugate and binocular diplopia  Motor: 5/5  Reflex:   Sensation:   Coordination:   Gait: normal      Imaging     Reports:  MRI Brain W WO Contrast    Result Date: 03/07/2023  Impression:   No intracranial mass, acute hemorrhage, acute infarction, or evidence of hydrocephalus. Normal MRI of the brain. Electronically signed by: Brenton Grills M.D. [facility] MK: 03/07/23    MRI Brain W WO Contrast    Result Date: 01/28/2023  Impression:  No acute intracranial or orbital abnormality. Milan Waunita Schooner, MD  01/28/2023 7:57 AM    CT Head without Contrast    Result Date: 01/27/2023  Impression:   1.No intracranial abnormality is seen. 2. Given the history and symptoms, follow-up MRI may be of benefit. Theodoro Doing, MD 01/27/2023 2:29 PM    MRI Lumbar Spine WO Contrast    Result Date: 12/19/2022  Impression:  1.  No acute osseous or soft tissue abnormality. 2.  Normal MR appearance of the distal spinal cord, conus medullaris and cauda equina. 3.  Rotatory lumbar levoscoliosis. 4.  Lumbar spondylosis and facet arthrosis without central stenosis or nerve root compression. Please see full details and segmental analysis above. Electronically signed by: Waynard Edwards M.D. [facility] CM: 12/19/22       Testing       Labs  Lab Results   Component Value Date    HGBA1C 5.4 01/28/2023    EGFR >60.0 03/06/2023    CREAT 0.7 03/06/2023    WBC 9.73 (H) 03/06/2023    LYMPHOABS 2.5 07/25/2022    IGG 950 02/20/2023    IGA 216 01/25/2021    IGM 198 01/25/2021    B12 297 01/27/2023    VITD 28 (L) 01/23/2023       ASSESSMENT / PLAN  RTC: Return in about 1 month (around 05/26/2023) for [place].    1. Behcet's disease w/ CNS involvement    2. Seizure  3. Intractable chronic migraine without aura and with status migrainosus    4. Binocular vision disorder with diplopia        AN  Agree w/ infliximab 5mg /kg q 8 weeks;  Agree w/ [facility] [place]  Prednisone 10mg  qam  Continue vpa 500mg  bid (may have benefit on migraine) and check level; potential interaction w/ mtx?  start lacosamide 200mg  qhs for seizure prophy -     Orders today (details below)  Orders Placed This Encounter   Procedures    Valproic Acid Level, Total and Free    Infliximab Level and Anti-drug Antibody for IBD    Ambulatory referral to Optometry     Orders Placed This Encounter   Medications    lacosamide (VIMPAT) 200 MG Tab     Sig: Take 1 tablet (200 mg) by mouth nightly     Dispense:  90 tablet     Refill:  1       I spent 40 minutes reviewing the records,  talking with the patient and developing their care plan. This excludes time for separately billed procedures.      Lora Havens, MD PHD  Director, Neuroimmunology & MS Center  [facility] of Medicine  [address], [place], [place] [address]    Main line / scheduling: [phone]    After-hours Neurologist On Call: [phone]    For direct access, contact our MS Nurse, Allayne Stack RN  The fastest response is via mychart  Tel [phone]  Fax [phone]   ===================================================================  NFL (neurofilament light chain) Insurance Justification  Serum / plasma Neurofilament light chain is a well-established biomarker for MS relapses, and potentially for non-relapsing progressive disease as well (it is sensitive but not necessarily specific). It also has established utility in other CNS and peripheral nerve degenerative conditions. Recently, testing has become commercially available.  This data has emerged from multiple independent groups -- eg, [employer] working groups in [place] & [place]; [place], [place], and [place] reference laboratories; [employer]; and studies of MS cohorts at [employer]; and clinical trials for various MS therapies. NFL can improve upon the sensitivity of MRI (current standard of care) for detecting disease activity/progression. Further, most MS therapies have been shown to decrease sNFL levels, with higher efficacy therapies demonstrating greater reduction than lower efficacy therapies. Overall, when sNFL values are interpreted in context of patient's clinical situation, they are likely to impact management and treatment decisions.     PATIENT INSTRUCTIONS PROVIDED  Patient was given an "After Visit Summary" with a copy of the testing orders, medications and the following other instructions:  There are no Patient Instructions on file for this visit.    IMPORTED INFORMATION FROM  MEDICAL RECORDS   Diagnosis ICD-10-CM Associated Order   1. Behcet's disease w/ CNS involvement  M35.2 Valproic Acid Level, Total and Free     Infliximab Level and Anti-drug Antibody for IBD      2. Seizure  R56.9 Valproic Acid Level, Total and Free     Infliximab Level and Anti-drug Antibody for IBD      3. Intractable chronic migraine without aura and with status migrainosus  G43.711 Valproic Acid Level, Total and Free     Infliximab Level and Anti-drug Antibody for IBD      4. Binocular vision disorder with diplopia  H53.2 Valproic Acid Level, Total and Free     Infliximab Level and Anti-drug Antibody for IBD  Ambulatory referral to Optometry        Orders Placed This Encounter   Procedures    Valproic Acid Level, Total and Free     Standing Status:   Future     Standing Expiration Date:   04/25/2024     Order Specific Question:   Release to patient     Answer:   Immediate    Infliximab Level and Anti-drug Antibody for IBD     Standing Status:   Future     Standing Expiration Date:   04/25/2024     Order Specific Question:   Release to patient     Answer:   Immediate    Ambulatory referral to Optometry     Standing Status:   Future     Standing Expiration Date:   04/25/2024     Referral Priority:   Routine     Referral Type:   Consultation     Referral Reason:   Services Requested     Requested Specialty:   Optometry     Number of Visits Requested:   1

## 2023-04-29 ENCOUNTER — Other Ambulatory Visit (INDEPENDENT_AMBULATORY_CARE_PROVIDER_SITE_OTHER): Payer: Self-pay | Admitting: Internal Medicine

## 2023-04-29 DIAGNOSIS — H209 Unspecified iridocyclitis: Secondary | ICD-10-CM

## 2023-04-29 DIAGNOSIS — M352 Behcet's disease: Secondary | ICD-10-CM

## 2023-04-29 DIAGNOSIS — Z79899 Other long term (current) drug therapy: Secondary | ICD-10-CM

## 2023-04-29 DIAGNOSIS — M0600 Rheumatoid arthritis without rheumatoid factor, unspecified site: Secondary | ICD-10-CM

## 2023-04-30 ENCOUNTER — Encounter: Payer: Self-pay | Admitting: Neurology

## 2023-04-30 DIAGNOSIS — R569 Unspecified convulsions: Secondary | ICD-10-CM

## 2023-04-30 DIAGNOSIS — G43711 Chronic migraine without aura, intractable, with status migrainosus: Secondary | ICD-10-CM

## 2023-05-01 ENCOUNTER — Other Ambulatory Visit (FREE_STANDING_LABORATORY_FACILITY): Payer: BLUE CROSS/BLUE SHIELD

## 2023-05-01 ENCOUNTER — Ambulatory Visit: Payer: BLUE CROSS/BLUE SHIELD | Attending: Internal Medicine

## 2023-05-01 VITALS — BP 144/82 | HR 88 | Temp 97.5°F | Wt 321.2 lb

## 2023-05-01 DIAGNOSIS — M352 Behcet's disease: Secondary | ICD-10-CM

## 2023-05-01 DIAGNOSIS — M0600 Rheumatoid arthritis without rheumatoid factor, unspecified site: Secondary | ICD-10-CM | POA: Insufficient documentation

## 2023-05-01 DIAGNOSIS — H209 Unspecified iridocyclitis: Secondary | ICD-10-CM | POA: Insufficient documentation

## 2023-05-01 DIAGNOSIS — R569 Unspecified convulsions: Secondary | ICD-10-CM

## 2023-05-01 DIAGNOSIS — G43711 Chronic migraine without aura, intractable, with status migrainosus: Secondary | ICD-10-CM

## 2023-05-01 DIAGNOSIS — H532 Diplopia: Secondary | ICD-10-CM

## 2023-05-01 LAB — COMPREHENSIVE METABOLIC PANEL
ALT: 81 U/L — ABNORMAL HIGH (ref 0–55)
AST (SGOT): 44 U/L — ABNORMAL HIGH (ref 5–41)
Albumin/Globulin Ratio: 1.1 (ref 0.9–2.2)
Albumin: 3.8 g/dL (ref 3.5–5.0)
Alkaline Phosphatase: 50 U/L (ref 37–117)
Anion Gap: 9 (ref 5.0–15.0)
BUN: 13 mg/dL (ref 7–21)
Bilirubin, Total: 0.4 mg/dL (ref 0.2–1.2)
CO2: 23 mEq/L (ref 17–29)
Calcium: 9.5 mg/dL (ref 8.5–10.5)
Chloride: 106 mEq/L (ref 99–111)
Creatinine: 0.8 mg/dL (ref 0.4–1.0)
GFR: >60 mL/min/{1.73_m2} (ref >=60.0)
Globulin: 3.5 g/dL (ref 2.0–3.6)
Glucose: 113 mg/dL — ABNORMAL HIGH (ref 70–100)
Hemolysis Index: 11 Index
Potassium: 3.7 mEq/L (ref 3.5–5.3)
Protein, Total: 7.3 g/dL (ref 6.0–8.3)
Sodium: 138 mEq/L (ref 135–145)

## 2023-05-01 LAB — LAB USE ONLY - CBC WITH DIFFERENTIAL
Absolute Basophils: 0.06 10*3/uL (ref 0.00–0.08)
Absolute Eosinophils: 0.15 10*3/uL (ref 0.00–0.44)
Absolute Immature Granulocytes: 0.07 10*3/uL (ref 0.00–0.07)
Absolute Lymphocytes: 4.71 10*3/uL — ABNORMAL HIGH (ref 0.42–3.22)
Absolute Monocytes: 0.81 10*3/uL (ref 0.21–0.85)
Absolute Neutrophils: 6.65 10*3/uL — ABNORMAL HIGH (ref 1.10–6.33)
Absolute nRBC: 0 10*3/uL (ref <=0.00)
Basophils %: 0.5 %
Eosinophils %: 1.2 %
Hematocrit: 41.3 % (ref 34.7–43.7)
Hemoglobin: 14 g/dL (ref 11.4–14.8)
Immature Granulocytes %: 0.6 %
Lymphocytes %: 37.8 %
MCH: 31.3 pg (ref 25.1–33.5)
MCHC: 33.9 g/dL (ref 31.5–35.8)
MCV: 92.4 fL (ref 78.0–96.0)
MPV: 8.3 fL — ABNORMAL LOW (ref 8.9–12.5)
Monocytes %: 6.5 %
Neutrophils %: 53.4 %
Platelet Count: 349 10*3/uL — ABNORMAL HIGH (ref 142–346)
Preliminary Absolute Neutrophil Count: 6.65 10*3/uL — ABNORMAL HIGH (ref 1.10–6.33)
RBC: 4.47 10*6/uL (ref 3.90–5.10)
RDW: 13 % (ref 11–15)
WBC: 12.45 10*3/uL — ABNORMAL HIGH (ref 3.10–9.50)
nRBC %: 0 /100 WBC (ref <=0.0)

## 2023-05-01 LAB — C-REACTIVE PROTEIN: C-Reactive Protein: <0.1 mg/dL (ref 0.0–1.1)

## 2023-05-01 MED ORDER — DIPHENHYDRAMINE HCL 50 MG/ML IJ SOLN
25.0000 mg | Freq: Once | INTRAMUSCULAR | Status: AC
Start: 2023-05-01 — End: 2023-05-01
  Administered 2023-05-01: 25 mg via INTRAVENOUS
  Filled 2023-05-01: qty 1

## 2023-05-01 MED ORDER — SODIUM CHLORIDE 0.9 % IV SOLN
5.0000 mg/kg | Freq: Once | INTRAVENOUS | Status: AC
Start: 2023-05-01 — End: 2023-05-01
  Administered 2023-05-01: 729 mg via INTRAVENOUS
  Filled 2023-05-01: qty 729

## 2023-05-01 MED ORDER — METHYLPREDNISOLONE SODIUM SUCC 40 MG IJ SOLR (WRAP)
40.0000 mg | Freq: Once | INTRAMUSCULAR | Status: AC
Start: 2023-05-01 — End: 2023-05-01
  Administered 2023-05-01: 40 mg via INTRAVENOUS
  Filled 2023-05-01: qty 1

## 2023-05-01 MED ORDER — SODIUM CHLORIDE 0.9 % IV SOLN
250.0000 mL | INTRAVENOUS | Status: DC
Start: 2023-05-01 — End: 2023-05-01
  Administered 2023-05-01: 250 mL via INTRAVENOUS

## 2023-05-01 MED ORDER — ACETAMINOPHEN 325 MG PO TABS
650.0000 mg | ORAL_TABLET | Freq: Once | ORAL | Status: AC
Start: 2023-05-01 — End: 2023-05-01
  Administered 2023-05-01: 650 mg via ORAL
  Filled 2023-05-01: qty 2

## 2023-05-01 NOTE — Progress Notes (Signed)
[facility] - Adult Infusion   Visit Date: 05/01/2023      Hailey [name] is a 42 y.o. female patient of Debe Coder, MD.    Since her last visit, she has been doing fair.   Patient presents to the infusion center for Infliximab-axxq.  She reports ongoing impaired vision, migraines, generalized body aches, intermittent seizures, and diarrhea that she attributes to several new medications she recently started. She has seen rheumatology, gastroenterology, and neurology in the last month.    Diagnosis: Behcet's disease      Pre-Medications: acetaminophen (Tylenol), diphenhydramine (Benadryl), and Solu-MEDROL    Line Type: Peripheral IV:  Left Hand  Blood Return verified prior to administration: Yes    Vital Signs:  Patient Vitals for the past 12 hrs:   BP Temp Pulse   05/01/23 1715 144/82 -- 88   05/01/23 1644 135/85 97.5 F (36.4 C) 91   05/01/23 1611 (!) 125/91 97.7 F (36.5 C) 86   05/01/23 1542 130/86 97.3 F (36.3 C) 91   05/01/23 1441 -- -- (!) 103   05/01/23 1425 (!) 140/93 97.8 F (36.6 C) (!) 115      Body surface area is 2.64 meters squared.    '  Chemistry        Component Value Date/Time    NA 138 05/01/2023 1412    NA 139 03/06/2023 1355    K 3.7 05/01/2023 1412    K 4.0 03/06/2023 1355    CL 106 05/01/2023 1412    CL 108 03/06/2023 1355    CO2 23 05/01/2023 1412    CO2 25 03/06/2023 1355    BUN 13 05/01/2023 1412    BUN 8.0 03/06/2023 1355    CREAT 0.8 05/01/2023 1412    CREAT 0.7 03/06/2023 1355    CREAT 0.72 07/25/2022 1133    GLU 113 (H) 05/01/2023 1412    GLU 105 (H) 03/06/2023 1355        Component Value Date/Time    CA 9.5 05/01/2023 1412    CA 9.6 03/06/2023 1355    ALKPHOS 50 05/01/2023 1412    ALKPHOS 60 03/06/2023 1355    AST 44 (H) 05/01/2023 1412    AST 30 03/06/2023 1355    ALT 81 (H) 05/01/2023 1412    ALT 39 03/06/2023 1355    BILITOTAL 0.4 05/01/2023 1412    BILITOTAL 0.3 03/06/2023 1355          Lab Results   Component Value Date    WBC 12.45 (H)  05/01/2023    HGB 14.0 05/01/2023    PLT 349 (H) 05/01/2023    NEUTROABS 6.65 (H) 05/01/2023     Administrations This Visit       0.9% NaCl infusion       Admin Date  05/01/2023 Action  New Bag Dose  250 mL Rate  20 mL/hr Route  Intravenous Documented By  Leodis Binet, RN              acetaminophen (TYLENOL) tablet 650 mg       Admin Date  05/01/2023 Action  Given Dose  650 mg Route  Oral Documented By  Leodis Binet, RN              diphenhydrAMINE (BENADRYL) injection 25 mg       Admin Date  05/01/2023 Action  Given Dose  25 mg Route  Intravenous Documented By  Leodis Binet, RN  inFLIXimab-axxq (AVSOLA) 729 mg in sodium chloride 0.9 % 250 mL infusion       Admin Date  05/01/2023 Action  New Bag Dose  729 mg Route  Intravenous Documented By  Leodis Binet, RN              methylPREDNISolone sodium succinate (Solu-MEDROL) injection 40 mg       Admin Date  05/01/2023 Action  Given Dose  40 mg Route  Intravenous Documented By  Leodis Binet, RN                Infliximab-axxq administered at rates of 40 ml/hr x30 minutes, 80 ml/hr x30 minutes, 150 ml/hr x30 minutes, to a final rate of 250 ml/hr for remainder.    Hypersensitivity Reaction Noted: No  IV Post Hydration: No, not required  Treatment Outcome:Patient tolerated treatment well.  Blood return verified after administration: Yes. Peripheral Line flushed and removed.    Education: Patient/Family educated regarding the expected outcomes/side effects possible interactions of procedures. Patient verbalizes understanding.    Follow-up Plan: A return to the Infusion Center for the next treatment, has been requested for 06/26/23.      Leodis Binet, RN, RN    05/01/2023  5:18 PM

## 2023-05-02 LAB — SEDIMENTATION RATE: Sed Rate: 14 mm/Hr (ref <=20)

## 2023-05-03 LAB — VALPROIC ACID LEVEL, TOTAL AND FREE
Valproic Acid Free: 6.3 mg/L (ref 4.8–17.3)
Valproic Acid Level: 46.1 mg/L — ABNORMAL LOW (ref 50.0–100.0)

## 2023-05-05 MED ORDER — DIVALPROEX SODIUM 500 MG PO TBEC
500.0000 mg | DELAYED_RELEASE_TABLET | Freq: Two times a day (BID) | ORAL | 4 refills | Status: DC
Start: 2023-05-05 — End: 2023-06-02

## 2023-05-05 NOTE — Addendum Note (Signed)
Addended by: Vivi Martens on: 05/05/2023 01:14 PM     Modules accepted: Orders

## 2023-05-06 LAB — INFLIXIMAB LEVEL AND ANTI-DRUG ANTIBODY FOR IBD
Infliximab Antibody: <10 AU (ref <10)
Infliximab Level: 7.4 ug/mL

## 2023-05-10 ENCOUNTER — Telehealth: Payer: Self-pay

## 2023-05-19 ENCOUNTER — Encounter (INDEPENDENT_AMBULATORY_CARE_PROVIDER_SITE_OTHER): Payer: Self-pay | Admitting: Internal Medicine

## 2023-05-19 ENCOUNTER — Telehealth (INDEPENDENT_AMBULATORY_CARE_PROVIDER_SITE_OTHER): Payer: BLUE CROSS/BLUE SHIELD | Admitting: Internal Medicine

## 2023-05-19 DIAGNOSIS — M352 Behcet's disease: Secondary | ICD-10-CM

## 2023-05-19 DIAGNOSIS — G43819 Other migraine, intractable, without status migrainosus: Secondary | ICD-10-CM

## 2023-05-19 DIAGNOSIS — H209 Unspecified iridocyclitis: Secondary | ICD-10-CM

## 2023-05-19 DIAGNOSIS — Z79899 Other long term (current) drug therapy: Secondary | ICD-10-CM

## 2023-05-19 DIAGNOSIS — H538 Other visual disturbances: Secondary | ICD-10-CM

## 2023-05-19 DIAGNOSIS — M0600 Rheumatoid arthritis without rheumatoid factor, unspecified site: Secondary | ICD-10-CM

## 2023-05-19 NOTE — Patient Instructions (Signed)
Dear Kinzy Linda Chmiel ,     Thanks for arranging a video visit with me.    You may retrieve any orders from this visit in Mychart by using these steps:    After signing into Mychart  1) click Messaging  2) click Letters  3) select the Requisition  4) Review and click print    Please contact the clinic at [phone] with any questions and to schedule your follow up visit.    Sincerely,    Deaysia Grigoryan, MD  Rheumatologist  [facility]  [address]  [place], [place] [address]  Phone: [phone]  Fax: [phone]

## 2023-05-19 NOTE — Progress Notes (Signed)
RHEUMATOLOGY FOLLOW UP TELEMEDICINE NOTE  Telemedicine Documentation Requirements  Originating site (Patient location): home  Distant site (Provider location): Office  Provider and Title: Dr. Adin Hector  Type of Visit: Video Visit  Verbal Consent has been obtained to conduct a telemedicine visit on this date in order to minimize exposure to COVID-19.        PCP: Virgilio Frees, MD    HPI:    This patient is a 42 y.o. year female with Past Medical History of Prior Cholecystectomy, Wold-Parkinson White, Prior Hysterectomy who returns for follow up of Behcet's HLAB51+, recurrent oral ulcers, pathergy, low grade fevers and seronegative arthritis.  -ablation procedure for arrythmia 06/2022  -She is on Humria weekly for inflammatory arthritis from Behcets  [facility] for recurrent aphthous ulcers  -She is on dapsone from Dermatology, clindamycin at night  Dermatologist started her on dapsone-she has had a few scars that are healing-had injections from her dermatologist.  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 Shellee Milo      Interval History:  Last visit 04/26/23.    She reports ongoing double vision, intractable migraines-daily.  Migraines more severe and every day.    Last remicade infusion 05/01/23    Lightheadedness worse.  Word finding issues.    She has diffuse fatigue.    Increase in joint pain, Hip pain, elft ankle.  NO joint swelling or dactylitis.    She is having muscle twitching.  Face flushing.    She is still having seizures-once per week.    She is on MTX weekly. 2 tablets, no issues, daily folic acid    She has trouble falling sleep.      She saw Dr. Drucilla Schmidt 5/31-she says uveitis was reportedly normal.  She has some light sensitivity-she thinks maybe migraine related.  She says Dr. Drucilla Schmidt said uveitis was quiescent.    She has some mouth sores.    She is on prednisone 10 mg in the morning per Neurology.     Labs Reviewed:  05/01/23  Infliximab level wnl  CRP wnl  ESR 14  CMP wnl  CBC  wnl        Previous Imaging:  03/01/23  MRI Brain:  FINDINGS:      The brain parenchyma demonstrates no significant abnormality. There is no  abnormal parenchymal enhancement. No parenchymal mass lesion, acute  parenchymal hemorrhage, or acute infarction.     The ventricles, sulci, and cisterns are unremarkable in appearance.     The flow voids of the major intracranial vessels appear intact.     The bones and extracranial soft tissues are unremarkable.     IMPRESSION:      No intracranial mass, acute hemorrhage, acute infarction, or evidence of  hydrocephalus. Normal MRI of the brain    02/10/23:  Neck US:  Narrative & Impression   HISTORY: Neck swelling. Enlarged cervical lymph nodes.      COMPARISON: 01/28/2023     TECHNIQUE: Targeted soft tissue ultrasound of the area of concern, in the  area of both parotid glands and at level 1.     FINDINGS:   No enlarged or otherwise abnormal appearing lymph node, other mass, fluid  collection, or other focal abnormality. The parotid glands appear symmetric  and normal in echotexture with no discrete mass.     IMPRESSION:      Normal exam         Labs Reviewed:  03/21/23  Surgical pathology A.  GASTRIC BIOPSY:          MINIMAL SUPERFICIAL CHRONIC INACTIVE INFLAMMATION, OTHERWISE     UNREMARKABLE; H. PYLORI NOT IDENTIFIED ON H&E STAIN        B.  COLON RANDOM BIOPS:     UNREMARKABLE COLONIC MUCOSA     03/06/23  CRP wnl  ESR 35  CMP wnl  CBC unremarkable    Previous Labs:  02/06/23  CRP wnl  ESR 24  CMP wnl  CBC WBC 10.96    Previous Labs:  11/25/22  Procalcitonin wnl  CMP wnl  CBC unremarkable      Previous Labs:  08/29/22  CBC platelets 350  BMP unremarkable  ESR wnl  CRP wnl      Imaging Reports Reviewed:  12/16/22  MRI Lumbar Spine:  IMPRESSION:         1.  No acute osseous or soft tissue abnormality.  2.  Normal MR appearance of the distal spinal cord, conus medullaris and  cauda equina.  3.  Rotatory lumbar levoscoliosis.  4.  Lumbar spondylosis and facet arthrosis without central  stenosis or  nerve root compression. Please see full details and segmental analysis  above.    CT Chest Angio:  11/23/22  FINDINGS:      LINES/TUBES: None.     LUNGS: No consolidation, edema or mass. There is linear atelectasis at the  lung bases.  PLEURA: No pleural effusions or pneumothorax.     HEART: Not enlarged.  MEDIASTINUM: No axillary, hilar or mediastinal lymphadenopathy.  PULMONARY ARTERIES: No acute pulmonary emboli.  AORTA: No aneurysm or dissection.     UPPER ABDOMEN: Hepatic steatosis. No acute abnormality.     BONES AND SOFT TISSUES: Unremarkable.     IMPRESSION:      No acute pulmonary embolism.    CT A/P  11/23/22:  FINDINGS:      LINES/TUBES: None.     LOWER THORAX: Bibasilar atelectasis.     LIVER/BILIARY TREE: The gallbladder is absent. There is hepatic steatosis.  No biliary dilation.  SPLEEN: No splenomegaly.  PANCREAS: No pancreatic mass or duct dilatation.     KIDNEYS/URETERS: No hydronephrosis, stones or solid mass lesions.  ADRENALS: No adrenal mass.  PELVIC ORGANS/BLADDER: No pelvic masses. The bladder is partially  distended.     PERITONEUM/RETROPERITONEUM: No free air or fluid.  LYMPH NODES: No lymphadenopathy.  VESSELS: No aortic aneurysm.     GI TRACT: No bowel wall thickening or dilation. Normal appendix.     BONES AND SOFT TISSUES: There are degenerative changes in the spine. No  suspicious or destructive osseous lesion.     IMPRESSION:      No acute abnormality.    Previous Imaging:      09/19/22:  ECHO:  Left Ventricle    The left ventricle is normal in size. There is normal left ventricular  geometry. Left ventricular systolic function is normal with an ejection  fraction by Biplane Method of Discs of  60 %.  3D LVEF 58%. Left ventricular  segmental wall motion is normal. Left ventricular diastolic filling parameters  are consistent with Grade I diastolic dysfunction (impaired relaxation  pattern).  Right Ventricle    The right ventricular cavity size is normal in size. Normal  right  ventricular systolic function.    08/27/22  MRI left Hip:  IMPRESSION:      1.Trace left hip joint effusion without significant synovitis. Tear of the  left anterosuperior acetabular labrum. Otherwise unremarkable left  hip  joint.     2.Mild to moderate left distal gluteus minimus tendinosis and mild to  moderate left greater trochanteric bursitis with bursal edema. There is  mild left proximal hamstring tendinosis.      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     4.There has been interval enlargement of a left adnexal cyst since pelvic  ultrasound 03/18/2022, now measuring 3.3 cm. This could be further  evaluated with follow-up pelvic ultrasound.     5.Unchanged 3.8 cm cyst adjacent to the vaginal cuff post hysterectomy  since multiple prior exams favoring a benign etiology.      Previous Labs:  07/25/22  ESR 37  CRP wnl  CMP ALt 39  CBC wnl      Imaging Reports Reviewed:  04/12/22  Cardiac MRI:  IMPRESSION:       1. No delayed gadolinium enhancement to suggest myocardial fibrosis or  scarring.  2. Normal right ventricular systolic function.  3. Qualitatively mild global left ventricular hypokinesis. Left ventricular  systolic function measures 32% and may be artifactually reduced secondary  to significant motion artifact.    Labs Reviewed:  04/19/22  Throat culture no B-hemolytic strep  POCT flu A/B  Abbot -      Previous Labs:  01/14/22  CMP wnl  CRP wnl  ViT D 31  ESR wnl  Lipid panel wnl  TSH wnl      08/18/21  CBC wnl  CMP wnl  ESR wnl  CRP wnl    Previous Labs:  05/25/21  Quant Gold-  HepbSAb wnl  HepbSAg-  HepCAb-  HIV-        Previous Labs:  02/23/21  ESR wnl  CRP wnl  CMP ALT 42, AST normal    CRP wnl  ESR wnl  HLAB51 positive  IgE, IgA, IGG, IgM all wnl  CBC wnl    12/03/20  DIAGNOSIS:          A. Lesion, ulnar wrist, excision:     -Fibroadipose tissue and vasculature          B. Synovial tissue, biopsy:     -Fibroadipose tissue and synovium (see comment)          Comment:      Morphologic features typical of rheumatoid arthritis or infection are     not identified.    12/03/20  Bacterial Cultures: No Growth  Fungal Cultures: NG at one week  AFB: No growth    11/20/20  ESR 50  CRP wnl  IgG subclasses wnl  ANA comprehensive panel negative        Previous Labs:    [facility] Labs:  10/23/20  C-ANCA, P-ANCA, MPO, PR3 all negative  Mitochondrial Ab-  ANA IFA-  Scl70-  Centromere-  RNP-  SMith-  TPPA Ab-      05/18/20  Ferritin 291  CMP AST/A:T 59/70  CBC elevated lymphocytes    02/06/20  MPO-  PR3-  SSA-  aCL IgM 14  aCL IgG, IgA -  Lupus anticoagulant -  B2IgG, IgM -  ACE wnl  CCP-  RF-  Vit D 31  TSH wnl  ESR wnl  CK wnl  C4 wnl  C3 wnl  CRP wnl    07/2019  ANA Screen-  LDH wnl    07/23/2018 lymph node biopsy:DIAGNOSIS:          Lymph node, deep right neck, excisional biopsy:     -Lymph node with non-specific  reactive changes   Wound, AFB and Fungal cultures negative at this time    BONE MARROW 05/03/2019      BONE MARROW, LEFT POSTERIOR ILIAC CREST, CORE BIOPSY, CLOT SECTION,    ASPIRATE SMEAR AND TOUCH PREP:        - NORMOCELLULAR BONE MARROW (approximate50-60%) WITH TRILINEAGE HEMATOPOIESIS,  NORMAL MYELOID TO ERYTHROID RATIO AND ADEQUATE NUMBERS OF    MEGAKARYOCYTES WIT H UNREMARKABLE MORPHOLOGY        - A FEW SMALL REACTIVE LYMPHOID AGGREGATES IDENTIFIED, AND MILDLY  INCREASED NUMBERS OF EOSINOPHILS        - PENDING CYTOGENETIC STUDIES OF BONE MARROW SAMPLE  - Remain pending as of 05/14/2019.        COMMENT:    The concurrent flow cytometry performed on the bone marrow aspirate  sample shows no evidence of immunophenotypic abnormalities      Imaging Reviewed:    08/16/20  MRI Brain:IMPRESSION:      1. No intracranial mass, hemorrhage, or hydrocephalus is detected.  2. There is normal pattern of enhancement of the brain parenchyma and  meningeal surfaces.    9/18 MRI Abdomen:  IMPRESSION:      1. Diffuse hepatic steatosis. No suspicious focal hepatic lesions.  2. No biliary ductal dilatation or  choledocholithiasis.    02/03/20  MRI Pelvis:  IMPRESSION:         Cystic structure at the vaginal cuff, flank by both ovaries not  significantly changed in size compared to 2019. No abnormal internal  enhancement. This favors a benign process such as a peritoneal inclusion  cyst or a paraovarian cyst.    12/14/19 MRI R Hand:  IMPRESSION:      1.  No suspicious soft tissue mass, osseous mass, or masslike  enhancement in the right hand or wrist to correlate with the diffuse FDG  uptake on recent PET/CT.  2.  There is mild enhancing synovitis in the right wrist joint without  active joint erosion or bony destruction.  3.  A small dorsal ganglion cyst overlying the scapholunate interval.    12/06/19  Full Body PET Scan:    Hypermetabolic soft tissue mass in palmar R hand surface at base of the second digit-stable pulmonary nodulke 1.3 cm left lower lobe-recommend follow up Chest CT 3 months         The following sections were reviewed this encounter by the provider:   Tobacco  Allergies  Meds  Problems  Med Hx  Surg Hx  Fam Hx         PMH/PSH:  Past Medical History:   Diagnosis Date    Abnormal vision     reading glasses    Anxiety     Arrhythmia     WPW ablation in 2005, 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 Released from cardiology    Behcet's disease     She sees Dr. 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    02/11/23 stable on medical    Diarrhea     Ear, nose and throat disorder     seasonal allergies    Fatigue     Bechet's    Fever 11/09/2022    Headache     migraine    Hx of ovarian cyst     Lower back pain     not severe    Meningitis 2011    Viral // treated and resolved    Pain in wrist 11/09/2022  Post-operative nausea and vomiting     Scoliosis     moderate// used a Brace at a young age    Uveitis     using eyedrops        Past Surgical History:   Procedure Laterality Date    ABLATION - PVC'S N/A 06/08/2022    Procedure: Ablation - PVC's;  Surgeon: [name], MD;  Location: [facility];  Service:  Cardiovascular;  Laterality: N/A;  SAME DAY DISCHARGE  CARTO NO TEE    ARTHROTOMY, WRIST Right 12/03/2020    Procedure: RIGHT UPPER EXTREMITY SYNOVIAL BIOPSY;  Surgeon: Kellie Simmering, MD;  Location: [facility];  Service: Plastics;  Laterality: Right;    BIOPSY, LYMPH NODE N/A 07/23/2018    Procedure: BIOPSY, LYMPH NODE;  Surgeon: Shelda Pal, MD;  Location: [facility];  Service: ENT;  Laterality: N/A;  NECK DEEP LYMPH NODE BIOPSY    BONE MARROW BIOPSY  2020    led to behcet's dx    CARDIAC ABLATION  2005    [facility] for Wolff-Parkinson-[facility]    CHOLECYSTECTOMY  2009    COLONOSCOPY, DIAGNOSTIC (SCREENING) N/A 03/21/2023    Procedure: COLONOSCOPY, DIAGNOSTIC (SCREENING);  Surgeon: Darlyn Chamber, MD;  Location: [facility];  Service: Gastroenterology;  Laterality: N/A;    DILATION AND CURETTAGE OF UTERUS  2010    EGD N/A 03/21/2023    Procedure: EGD;  Surgeon: Darlyn Chamber, MD;  Location: [facility];  Service: Gastroenterology;  Laterality: N/A;    FINGER GANGLION CYST EXCISION  04/2018    GANGLION CYST EXCISION  2014    04/2018 cyst removed from index finger-[facility]    HYSTERECTOMY  2012    HYSTEROSCOPY  2011    indication: Abnormal bleeding    LAPAROSCOPIC, LYSIS, ADHESIONS N/A 11/09/2022    Procedure: LAPAROSCOPIC LYSIS OF ADHESIONS;  Surgeon: Devota Pace, MD;  Location: [facility];  Service: Gynecology;  Laterality: N/A;    LAPAROSCOPIC, SALPINGECTOMY Left 11/09/2022    Procedure: LAPAROSCOPIC, SALPINGECTOMY LEFT;  Surgeon: Devota Pace, MD;  Location: [facility];  Service: Gynecology;  Laterality: Left;    LAPAROSCOPY, DIAGNOSTIC N/A 11/09/2022    Procedure: LAPAROSCOPY, DIAGNOSTIC;  Surgeon: Devota Pace, MD;  Location: [facility];  Service: Gynecology;  Laterality: N/A;  [id number]    UMBILICAL HERNIA REPAIR  2016    UPPER GASTROINTESTINAL ENDOSCOPY  2009    UPPER GASTROINTESTINAL ENDOSCOPY  03/2023    WISDOM TOOTH EXTRACTION   1998        FH/SH:  Family History   Adopted: Yes       Social History     Tobacco Use    Smoking status: Never     Passive exposure: Never    Smokeless tobacco: Never   Vaping Use    Vaping status: Never Used   Substance Use Topics    Alcohol use: Never    Drug use: Never        Meds/ Allergies:  No outpatient medications have been marked as taking for the 05/19/23 encounter (Telemedicine Visit) with Baltazar Najjar, MD.     Allergies   Allergen Reactions    Fruit Blend Flavor [Flavoring Agent] Anaphylaxis and Swelling     Tropical Fruit     Latex Hives, Swelling, Other (See Comments) and Drug-Induced Flushing    Adhesive [Wound Dressing Adhesive] Itching    Cyanoacrylate Itching    Other Itching and Rash  Sutures Monocryl    Oxycodone Hallucinations          PHYSICAL EXAM  General- WNWD, alert and oriented, NAD  Eyes- EOMI, PERRL, no conjunctival injection, no scleral icterus  ENMT- face symmetric without lesions  Skin- no rash, no alopecia  Pulm: No conversational dyspnea, breathing comfortable on room air  Neuro - no notable neurological deficits, no facial asymmetry        LABS:  Lab Results   Component Value Date    WBC 12.45 (H) 05/01/2023    HGB 14.0 05/01/2023    HCT 41.3 05/01/2023    MCV 92.4 05/01/2023    PLT 349 (H) 05/01/2023      Lab Results   Component Value Date    CREAT 0.8 05/01/2023    BUN 13 05/01/2023    NA 138 05/01/2023    K 3.7 05/01/2023    CL 106 05/01/2023    CO2 23 05/01/2023      Lab Results   Component Value Date    ALT 81 (H) 05/01/2023    AST 44 (H) 05/01/2023    ALKPHOS 50 05/01/2023    BILITOTAL 0.4 05/01/2023     Lab Results   Component Value Date    ESR 14 05/01/2023    ESR 35 (H) 03/06/2023    ESR 24 (H) 02/06/2023    ESR 25 (H) 01/27/2023    ESR 28 (H) 01/23/2023      Lab Results   Component Value Date    CRP <0.1 05/01/2023    CRP <0.1 03/06/2023    CRP <0.1 02/06/2023    CRP <0.1 01/27/2023    CRP 0.1 01/23/2023      No results found for: "ANA"   Lab Results   Component  Value Date    RHEUMFACTOR <15.0 02/06/2020      Lab Results   Component Value Date    CCP <15.6 02/06/2020     No results found for: "URICACID"        ASSESSMENT/PLAN:    This patient is a 42 y.o. year female with Past Medical History of Prior Cholecystectomy, Wolf-Parkinson White, Prior Hysterectomy, Fatty Liver Disease who returns for follow up of Behcet's Disease and seronegative rheumatoid arthritis complicated with recent uveitis for which she was switched to Remicade from humira.    She will continue Remicade-on 5 mg/kg and will maintain on this for now, last dose 6/24.  She is on very low dose MTX 5 tabs weekly to prevent development of anti drug TNF anitbodies.  Continue Otezla for oral uclers, inflamamtory arthropathy.    Worsening and more frequent headaches, persistent blurry vision-concernd for other etiology-SVT?  IIH?  Messaged Dr. Theodoro Grist to discuss-he will see patient next week and did not think MRV was indicated at this point-he would consider if repeat MRI brain and repeat LP indicated.  Patient already on appropriate regimen for autoimmune behcets with inflammatory arthritis and uveitis and uveitis seems to have calmed down with treatment.      She is following with ophtho for uveitis-recent exam reportedly normal with Dr. Margot Chimes was tapered off predisone drops    She will eventually need baseline pulmonary evaluation and PFTs-prolonged recovery from pneumonia, rule out obstructive disease.    High Risk medication labs normal-due in 3 months      1. Behcet's disease    2. Seronegative rheumatoid arthritis    3. High risk medication use    4. Uveitis    5.  Blurry vision    6. Other migraine without status migrainosus, intractable                               Total Time spent reviewing records, interviewing patient and coordinating plan of care: 36 min    FOLLOW UP:  Return in about 6 weeks (around 06/30/2023) for Follow Up, video 40 minutes.                                                                                   Debe Coder, MD  Rheumatologist  [facility]  [address]  [place], [place] [address]  Phone: [phone]  Fax: [phone]

## 2023-05-21 ENCOUNTER — Encounter: Payer: Self-pay | Admitting: Internal Medicine

## 2023-05-21 DIAGNOSIS — H209 Unspecified iridocyclitis: Secondary | ICD-10-CM

## 2023-05-21 DIAGNOSIS — D84821 Immunodeficiency due to drugs: Secondary | ICD-10-CM

## 2023-05-21 DIAGNOSIS — M352 Behcet's disease: Secondary | ICD-10-CM

## 2023-05-21 DIAGNOSIS — M0600 Rheumatoid arthritis without rheumatoid factor, unspecified site: Secondary | ICD-10-CM

## 2023-05-21 DIAGNOSIS — R569 Unspecified convulsions: Secondary | ICD-10-CM

## 2023-05-24 ENCOUNTER — Telehealth: Payer: Self-pay

## 2023-05-24 ENCOUNTER — Ambulatory Visit: Payer: BLUE CROSS/BLUE SHIELD | Attending: Family Nurse Practitioner | Admitting: Family Nurse Practitioner

## 2023-05-24 ENCOUNTER — Other Ambulatory Visit: Payer: Self-pay | Admitting: Family Nurse Practitioner

## 2023-05-24 ENCOUNTER — Encounter: Payer: Self-pay | Admitting: Family Nurse Practitioner

## 2023-05-24 VITALS — BP 152/96 | HR 90 | Resp 12 | Ht 68.0 in | Wt 321.0 lb

## 2023-05-24 DIAGNOSIS — R569 Unspecified convulsions: Secondary | ICD-10-CM | POA: Insufficient documentation

## 2023-05-24 DIAGNOSIS — G43709 Chronic migraine without aura, not intractable, without status migrainosus: Secondary | ICD-10-CM | POA: Insufficient documentation

## 2023-05-24 DIAGNOSIS — G43711 Chronic migraine without aura, intractable, with status migrainosus: Secondary | ICD-10-CM | POA: Insufficient documentation

## 2023-05-24 DIAGNOSIS — M352 Behcet's disease: Secondary | ICD-10-CM | POA: Insufficient documentation

## 2023-05-24 MED ORDER — ATOGEPANT 60 MG PO TABS
60.0000 mg | ORAL_TABLET | Freq: Every evening | ORAL | 11 refills | Status: DC
Start: 2023-05-24 — End: 2023-05-24

## 2023-05-24 MED ORDER — RIMEGEPANT SULFATE 75 MG PO TBDP
75.0000 mg | ORAL_TABLET | ORAL | 11 refills | Status: DC | PRN
Start: 2023-05-24 — End: 2023-05-24

## 2023-05-24 MED ORDER — ONABOTULINUMTOXINA 200 UNITS IJ SOLR
155.0000 [IU] | Freq: Once | INTRAMUSCULAR | Status: AC
Start: 2023-05-24 — End: 2023-05-24
  Administered 2023-05-24: 155 [IU] via INTRAMUSCULAR

## 2023-05-24 MED ORDER — KETOROLAC TROMETHAMINE 60 MG/2ML IM SOLN
60.0000 mg | Freq: Once | INTRAMUSCULAR | Status: AC
Start: 2023-05-24 — End: 2023-05-24
  Administered 2023-05-24: 60 mg via INTRAMUSCULAR

## 2023-05-24 NOTE — Patient Instructions (Signed)
Our plan:     -You were injected with Botox today    -You may have pain and discomfort at the injection sites    -Please do not rub the injection sites    -Common side effects include headache pain, neck pain, or weakness    -If you have any side effects of concerns, please call the office and let me know    -Please track your headache days using a headache log and bring that information to your follow up visit    -Please return in 12 weeks for repeat Botox injection     Discussed the importance of lifestyle modifications:  - sleep hygiene ( keep a regular sleep schedule) maintaining appropriate hydration,   - avoid overuse of caffeine,   - eat regular meals - minimizing processed foods and emphasizing lean proteins and fruits and vegetables.     --Keep headache diary, record frequency of headache, location, duration, intensity, preceding symptoms (aura occurrence), triggers and pain relieving measures.  -- recommend Migraine Buddy app     -Remember to limit acute headache treatment medicines (like triptans or anti-inflammatories) to 2-3 days/week to avoid medication-overuse headache.         For migraine education resources:  American Headache Society: https://americanheadachesociety.org  American Migraine Foundation: https://americanmigrainefoundation.org    Today's Visit:      In today's visit, I reviewed your medications and records relating your health - prior testing, blood work, reports of other health care providers present in your electronic medical record.     If you have pertinent records from any non-[facility] doctors that you would like to review, please have them sent to us or bring to them to your next office visit.     A copy of today's visit will be sent to your referring doctor and/or primary care doctor, if you have one listed in our system.      Patient satisfaction survey:      If you receive a patient satisfaction survey, I would greatly appreciate it if you would complete it. We value your  feedback.     Contact me online:      Patient Portal online - Please sign up for MyChart -- this is the best way to communicate with your team here.  There is a messaging feature, where you can us messages anytime of day.  It is the best way to communicate with us and get test results, medication refills, or ask questions.    If you are having a medical emergency -- call 911, DO NOT SEND A MESSAGE THROUGH MYCHART.     Coupons for medication:      If you have any trouble affording your medications, check out www.goodrx.com for coupons and competitive prices in your area.  If you need further assistance, let us know so we can work with you and your insurance to make sure you get the best care.    Lea Armstead FNP-C  [facility]   [facility]: [phone]  [place]: [phone]

## 2023-05-24 NOTE — Telephone Encounter (Signed)
Received request via MSOT and beginning benefits investigation for Qulipta 60mg     Patient has Insurance FEPBCBS    PA Required. Initiated via CMM [facility]). Submitted PA with appt notes. Pending determination.       ----  Received request via MSOT and beginning benefits investigation for Nurtec 75mg     Patient has Insurance FEPBCBS    PA Required. Initiated via CMM ([id number]). Submitted PA with appt notes. Pending determination.     Case ID [id number]    Effective Dates 05/24/23 to 05/23/24    Approved Qty/Day supply 16/30     Expected copay n/a (RTS 06/02/23) - Eligible for savings card    Prescription sent to pharmacy on file [id number] - [place], [place] - [address]

## 2023-05-24 NOTE — Progress Notes (Signed)
Botox 100 units witness by Kandyce Rud FNP Botox 200 units mixed with 4 ml N/S  draw in each 1 ml syringe and 30 G. Needle order read back and verified to Kandyce Rud FNP

## 2023-05-24 NOTE — Progress Notes (Signed)
05/24/2023    CC: migraine    History was obtained from patient   History of Present Illness:  Hailey [name] is a 42 y.o. 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     May 24 2023    Patient returns for Botox   Patient is getting daily migraines that are severe, Hailey [name] has been going on for over the last month  She has a migraine today in office  She continues Depakote prevention, patient is still having one seizure a week   Patient is also on vimpat for seizure prevention  Depakote isnt heloping with the migrianes  She continues Nurtec every other day  For acute migraine she uses triptans, aleve and caffeine    March 01 2023    Patient returns for repeat Botox injections  She was last seen in clinic by Dr. Thayer [name] in January   Today patient tells me in march she was admitted to [facility]  for aphasia   Per patient and chart review,  in March she was watching TV with her husband when she paused mid sentence and had a blank stare for a few moments. Patient reports a brief laps in memory during that time then quickly came back to and was able to reorient herself in conversation. A seizure was susspected  She was recommended to follow up outpatient for an EEG which she will be completing on 4/25  She had a second episode in April, and was started on depakote by Hailey [name]     Per patient and chart review, Dr. 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   She has had visual changes, uveitis, Dr. Theodoro [name] ordered an LP.  She had a LP done which caused a low pressure headache and received a blood patch on 4/19.   Pain was primarily in the occipital area, had a band like pain, pain improved when she was layed down  Low pressure Headaches improved within 48hrs  of the blood patch  She will be following up with Dr. Theodoro [name] on 4/29  She will have her 3rd infusion of remicade on monday    Patient had been doing well from a headache standpoint, her headaches have now changed  some in character.   Current headaches are retro orbital, in the occipital area and is almost daily  Her typical migraines encompass her entire left head   She does continue to have migrainsous features, states her rizatriptan has not been effective. She has taken this for several years      09/12/22  Patient returns for her 3rd treatment for chronic migraine  In October she had about 2 migraines, previously at least 16 migraines prior to Botox  Patient continues use Nurtec every other day for migraine prevention and rizatriptan for acute migraine treatment  She went to the ED in October for her left hip. She will be seeing ortho on Friday. The cause is unclear at this time, unlikely RA  She is taking ibuprofen  for the hip pain      06/22/22    Patient comes in today for her second Botox treatment for chronic migraine  Today patient tells me she is noted a decrease in the severity of her migraines but reports about the same frequency  Patient had some mild pathergy after last botox administration  She estimates her 30 day headache frequency/severity profile is about 10-15/several   2 weeks ago she had  a  cardiac cath for an ablation due to frequent ventricular ectopy  Patient continues Nurtec QOD and is using rizatriptan as needed       Mar 23, 2022  She presents for Botox injections. No other issues or conerns.     Notes reviewed: Rheumatology message re: pathergy and Botox injections    Mar 15 2022  Headache hx: since college  Location:bilateral and band like and left eye  Described as sharp and throbbing  Occurs:>15 HA days per month  Duration:>4 hours  Intensity: moderate to severe  Aura:none  Associated with photo/phonophobia, nausea and dizziness  Denies focal motor/sensory or visual changes.   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 Theodoro [name] progress note 04/26/23        Past Medical History:  Past Medical History:   Diagnosis Date    Abnormal vision     reading glasses    Anxiety     Arrhythmia     WPW ablation in 2005, 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 Released from cardiology    Behcet's disease     She sees Dr. 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    02/11/23 stable on medical    Diarrhea     Ear, nose and throat disorder     seasonal allergies    Fatigue     Bechet's    Fever 11/09/2022    Headache     migraine    Hx of ovarian cyst     Lower back pain     not severe    Meningitis 2011    Viral // treated and resolved    Pain in wrist 11/09/2022    Post-operative nausea and vomiting     Scoliosis     moderate// used a Brace at a young age    Uveitis     using eyedrops       Past Surgical History:  Past Surgical History:   Procedure Laterality Date    ABLATION - PVC'S N/A 06/08/2022    Procedure: Ablation - PVC's;  Surgeon: [name], MD;  Location: [facility];  Service: Cardiovascular;  Laterality: N/A;  SAME DAY DISCHARGE  CARTO NO TEE    ARTHROTOMY, WRIST Right 12/03/2020    Procedure: RIGHT UPPER EXTREMITY SYNOVIAL BIOPSY;  Surgeon: Kellie Simmering, MD;  Location: [facility];  Service: Plastics;  Laterality: Right;    BIOPSY, LYMPH NODE N/A 07/23/2018    Procedure: BIOPSY, LYMPH NODE;  Surgeon: Shelda Pal, MD;  Location: [facility];  Service: ENT;  Laterality: N/A;  NECK DEEP LYMPH NODE BIOPSY    BONE MARROW BIOPSY  2020    led to behcet's dx    CARDIAC ABLATION  2005    [facility] for [facility]    CHOLECYSTECTOMY  2009    COLONOSCOPY, DIAGNOSTIC (SCREENING) N/A 03/21/2023    Procedure: COLONOSCOPY, DIAGNOSTIC (SCREENING);  Surgeon: Darlyn Chamber, MD;   Location: [facility];  Service: Gastroenterology;  Laterality: N/A;  DILATION AND CURETTAGE OF UTERUS  2010    EGD N/A 03/21/2023    Procedure: EGD;  Surgeon: Darlyn Chamber, MD;  Location: [facility];  Service: Gastroenterology;  Laterality: N/A;    FINGER GANGLION CYST EXCISION  04/2018    GANGLION CYST EXCISION  2014    04/2018 cyst removed from index finger-[facility]    HYSTERECTOMY  2012    HYSTEROSCOPY  2011    indication: Abnormal bleeding    LAPAROSCOPIC, LYSIS, ADHESIONS N/A 11/09/2022    Procedure: LAPAROSCOPIC LYSIS OF ADHESIONS;  Surgeon: Devota Pace, MD;  Location: [facility];  Service: Gynecology;  Laterality: N/A;    LAPAROSCOPIC, SALPINGECTOMY Left 11/09/2022    Procedure: LAPAROSCOPIC, SALPINGECTOMY LEFT;  Surgeon: Devota Pace, MD;  Location: [facility];  Service: Gynecology;  Laterality: Left;    LAPAROSCOPY, DIAGNOSTIC N/A 11/09/2022    Procedure: LAPAROSCOPY, DIAGNOSTIC;  Surgeon: Devota Pace, MD;  Location: [facility];  Service: Gynecology;  Laterality: N/A;  [id number]    UMBILICAL HERNIA REPAIR  2016    UPPER GASTROINTESTINAL ENDOSCOPY  2009    UPPER GASTROINTESTINAL ENDOSCOPY  03/2023    WISDOM TOOTH EXTRACTION  1998       Allergies:  Fruit blend flavor [flavoring agent], Latex, Adhesive [wound dressing adhesive], Cyanoacrylate, Other, and Oxycodone    Family History:  Family History   Adopted: Yes     No other family history related to current complaints.    Social History:  Social History     Tobacco Use    Smoking status: Never     Passive exposure: Never    Smokeless tobacco: Never   Vaping Use    Vaping status: Never Used   Substance Use Topics    Alcohol use: Never    Drug use: Never       Medications:  Current Outpatient Medications   Medication Sig Dispense Refill    Apremilast (Otezla) 30 MG Tab Take 1 tablet (30 mg) by mouth 2 (two) times daily 180 tablet 1    Azelastine-Fluticasone 137-50 MCG/ACT Suspension 1 spray by Nasal route 2 (two)  times daily      botulinum toxin type A (Botox) 200 units injection Inject 155 units IM every 12 weeks 1 each 3    busPIRone (BUSPAR) 5 MG tablet Take 1 tablet (5 mg) by mouth 2 (two) times daily 180 tablet 3    clindamycin (CLEOCIN T) 1 % lotion Apply topically every morning      colesevelam (WELCHOL) 625 MG tablet Take 2 tablets (1,250 mg) by mouth every morning 180 tablet 3    Dapsone 7.5 % Gel Apply topically every morning      dicyclomine (BENTYL) 20 MG tablet Take 1 tablet (20 mg) by mouth every 6 (six) hours (Patient taking differently: Take 1 tablet (20 mg) by mouth every 6 (six) hours as needed) 120 tablet 3    divalproex, DR, delayed release (DEPAKOTE) 500 MG EC tablet Take 1 tablet (500 mg) by mouth 2 (two) times daily 60 tablet 4    eletriptan (RELPAX) 40 MG tablet Take 1 tablet (40 mg) by mouth as needed for Migraine .  Wait at least 2 hours prior to taking second dose.  Max 2 tabs/24 hours. Do not use more than 3 days per week. 12 tablet 5    EPINEPHrine (EPIPEN 2-PAK) 0.3 MG/0.3ML Solution Auto-injector injection Inject 0.3 mLs (0.3 mg) into the muscle once as needed (Anaphylaxis) 2  each 2    folic acid (FOLVITE) 1 MG tablet Take 1 tablet (1 mg) by mouth daily 30 tablet 2    hydrOXYzine (ATARAX) 10 MG tablet Take 1 tablet (10 mg) by mouth nightly 90 tablet 3    inFLIXimab 100 MG injection Infuse into the vein once every eight weeks      lacosamide (VIMPAT) 200 MG Tab Take 1 tablet (200 mg) by mouth nightly 90 tablet 1    lidocaine-prilocaine (EMLA) cream Apply topically as needed Dermatology procedures      methotrexate 2.5 MG tablet TAKE 2 TABLETS (5 MG) BY MOUTH ONCE A WEEK 24 tablet 1    Multiple Vitamin (MULTIVITAMIN PO) Take by mouth daily      ondansetron (ZOFRAN-ODT) 4 MG disintegrating tablet Take 1 tablet (4 mg) by mouth every 8 (eight) hours as needed for Nausea 30 tablet 2    predniSONE (DELTASONE) 10 MG tablet Take 1 tablet (10 mg) by mouth daily 30 tablet 5    Rimegepant Sulfate 75 MG  Tablet Dispersible Take 1 tablet (75 mg) by mouth every other day For migraine prevention 16 tablet 11    valACYclovir (VALTREX) 1000 MG tablet Take 2 tabs at onset; repeat once in 12 hours.  Total 4 tabs per outbreak (Patient taking differently: as needed Take 2 tabs at onset; repeat once in 12 hours.  Total 4 tabs per outbreak  As needed) 12 tablet 3    vitamin B-12 1000 MCG tablet Take 1 tablet (1,000 mcg) by mouth daily 30 tablet 0    VITAMIN D PO Take 1 tablet by mouth daily Dosage unsure      DORZOLAMIDE HCL OP Apply 1 drop to eye as needed Just started/does not have instructions yet      Jublia 10 % Solution Apply topically daily       No current facility-administered medications for this visit.       General Exam:  Ht 1.727 m (5\' 8" )   Wt 145.6 kg (321 lb)   LMP  (LMP Unknown)   BMI 48.81 kg/m     PHYSICAL EXAM:    Gen: pleasant, NAD. Appears stated age.    HEENT: MMM, OP clear, sclera non-icteric    Respiratory: no respiratory distress; normal rhythm and effort.     MS: alert, appropriate, speech fluent, follows commands    CN: EOM intact, PERRL 3-->82mm, face symmetric, hearing intact to voice.     No lateralizing features. No pronator drift.    Gait normal    Investigations:  LABS    '  Chemistry        Component Value Date/Time    NA 138 05/01/2023 1412    NA 139 03/06/2023 1355    K 3.7 05/01/2023 1412    K 4.0 03/06/2023 1355    CL 106 05/01/2023 1412    CL 108 03/06/2023 1355    CO2 23 05/01/2023 1412    CO2 25 03/06/2023 1355    BUN 13 05/01/2023 1412    BUN 8.0 03/06/2023 1355    CREAT 0.8 05/01/2023 1412    CREAT 0.7 03/06/2023 1355    CREAT 0.72 07/25/2022 1133    GLU 113 (H) 05/01/2023 1412    GLU 105 (H) 03/06/2023 1355        Component Value Date/Time    CA 9.5 05/01/2023 1412    CA 9.6 03/06/2023 1355    ALKPHOS 50 05/01/2023 1412    ALKPHOS 60 03/06/2023 1355  AST 44 (H) 05/01/2023 1412    AST 30 03/06/2023 1355    ALT 81 (H) 05/01/2023 1412    ALT 39 03/06/2023 1355    BILITOTAL 0.4  05/01/2023 1412    BILITOTAL 0.3 03/06/2023 1355        Lab Results   Component Value Date    WBC 12.45 (H) 05/01/2023    HGB 14.0 05/01/2023    HCT 41.3 05/01/2023    MCV 92.4 05/01/2023    PLT 349 (H) 05/01/2023         Imaging:    MRI Brain W WO Contrast    Result Date: 01/28/2023  Impression:  No acute intracranial or orbital abnormality. Milan Waunita Schooner, MD 01/28/2023 7:57 AM    CT Head without Contrast    Result Date: 01/27/2023  Impression:   1.No intracranial abnormality is seen. 2. Given the history and symptoms, follow-up MRI may be of benefit. Hailey Doing, MD 01/27/2023 2:29 PM    MRI Lumbar Spine WO Contrast    Result Date: 12/19/2022  Impression:  1.  No acute osseous or soft tissue abnormality. 2.  Normal MR appearance of the distal spinal cord, conus medullaris and cauda equina. 3.  Rotatory lumbar levoscoliosis. 4.  Lumbar spondylosis and facet arthrosis without central stenosis or nerve root compression. Please see full details and segmental analysis above. Electronically signed by: Waynard Edwards M.D. [facility] CM: 12/19/22         MRI Brain W WO Contrast    Result Date: 04/30/2021  Impression:  1. No intracranial abnormality is detected. MR imaging of the brain is stable with 08/13/2020. 2. There is no lesion within the spinal cord. 3. Mild cervical, thoracic and lumbar spondylosis as discussed above. Electronically signed by: Otho Ket D.O.  [Interpreted at: [facility] BG: 04/30/21    MRI Cervical Spine W WO Contrast    Result Date: 04/30/2021  Impression:  1. No intracranial abnormality is detected. MR imaging of the brain is stable with 08/13/2020. 2. There is no lesion within the spinal cord. 3. Mild cervical, thoracic and lumbar spondylosis as discussed above. Electronically signed by: Otho Ket D.O.  [Interpreted at: [facility] BG: 04/30/21    MRI Lumbar Spine W WO Contrast    Result  Date: 04/30/2021  Impression:  1. No intracranial abnormality is detected. MR imaging of the brain is stable with 08/13/2020. 2. There is no lesion within the spinal cord. 3. Mild cervical, thoracic and lumbar spondylosis as discussed above. Electronically signed by: Otho Ket D.O.  [Interpreted at: [facility] BG: 04/30/21    MRI Thoracic Spine W WO Contrast    Result Date: 04/30/2021  Impression:  1. No intracranial abnormality is detected. MR imaging of the brain is stable with 08/13/2020. 2. There is no lesion within the spinal cord. 3. Mild cervical, thoracic and lumbar spondylosis as discussed above. Electronically signed by: Otho Ket D.O.  [Interpreted at: [facility] BG: 04/30/21        Procedure Note:  BOTOX injection is indicated for the prophylaxis of headaches in adult patients with chronic migraine.  Patient meets indications for BOTOX therapy.   Potential risks and benefits were reviewed.  Side effects including, but not limited to, potential systemic allergic reactions of the anaphylactic type as well as local injection site reactions of blepharoptosis, diplopia, infection, bleeding, pain, redness and bruising were reviewed.  The  potential for headaches and/or neck pain post procedure were reviewed.   The patient's questions were answered.  The patient signed a consent form.  Patient understands that depending on their insurance carrier, there may be a copay for this treatment.   BOTOX was reconstituted using 200 units diluted with 4 mL of sterile saline.  BOTOX was injected as per the PREEMPT trial injection paradigm with dose administered as 5 unit intramuscular (IM) injections per site using a sterile, 30-gauge 0.5 inch needle as follows:     Muscle    Dose, # of Sites   Corrugator   10 units divided in 2 sites     Procerus   5 units in 1 site     Frontalis   20 units divided in 4 sites     Temporalis   40 units divided in 8  sites     Occipitalis   30 units divided in 6 sites     Cervical paraspinal  20 units divided in 4 sites     Trapezius   30 units divided in 6 sites    Each site was cleaned with alcohol prior to injection.  A total dose of 155 units were injected.  45 units were discarded/wasted. The patient tolerated the procedure well with no immediate complications.     Medication Info:  [facility] [id number]  Lot # [id number]  Exp 06/2025      Impression:    Hailey [name] is a 42 y.o. 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  She has headaches >15 days per month and at least 8 of these are migrainous.  Migraines last >4 hours if untreated.  This pattern has continued for >3 months.  She has failed at least two preventive medications.      --- patients co morbidities may be contributing to overall headache burden. Patient still finds botox effective, but is in need of additional migraine prevention      Plan/ Recommendations:    1. Intractable chronic migraine without aura and with status migrainosus  - botulinum toxin type A (BOTOX) injection 155 Units  - Atogepant 60 MG Tab; Take 1 tablet (60 mg) by mouth nightly For migraine prvention  Dispense: 30 tablet; Refill: 11  - ketorolac (TORADOL) IM injection 60 mg    2. Behcet's disease w/ CNS involvement    3. Seizure-like activity    4. Chronic migraine without aura without status migrainosus, not intractable  - Rimegepant Sulfate 75 MG Tablet Dispersible; Take 1 tablet (75 mg) by mouth as needed (migraine)  Dispense: 16 tablet; Refill: 11      -- 60mg  of ketorolac given to patient today for persisting migraine  -- 155 units of Botox administered today, patient tolerated procedure well  -- start Qulipta 60mg  for additional migraine prevention  -- continue Depakote 500mg  BID for seizure prophylaxis, this has not helped with migraines, OK  to change for seizure prophylaxis  -- May use Nurtec as needed for acute migraine  -- may use  eletriptan as backup-- patient counseled on limiting triptans to less than 10 days/month   -- continue follow up with Dr. Theodoro [name] for bechets   -- RTC in 12 weeks, sooner if needed    Discussed the importance of lifestyle modifications:  - sleep hygiene ( keep a regular sleep schedule) maintaining appropriate hydration,   -  avoid overuse of caffeine,   - eat regular meals - minimizing processed foods and emphasizing lean proteins and fruits and vegetables.     --Keep headache diary, record frequency of headache, location, duration, intensity, preceding symptoms (aura occurrence), triggers and pain relieving measures.  -- recommend Migraine Buddy app     -Remember to limit acute headache treatment medicines (like triptans or anti-inflammatories) to 2-3 days/week to avoid medication-overuse headache.      The patient should seek emergent care if there is any change in the symptoms. Proper use and all side effects of medications discussed      Please contact me with any questions. Patients and [facility] Providers can reach me via MyChart.      A total of 33 minutes was spent on this visit outside of the procedure  reviewing previous notes, counseling the patient and/or family members on the patient's headache disorder, medications, and expected outcomes; ordering of tests, adjusting meds and documenting the findings in the note.      Dr. Fraser Din was available in a supervisory role    Lowanda Foster FNP-C  [facility]   [facility]: [phone])- [phone]  [place]: [phone])- [phone]

## 2023-05-25 ENCOUNTER — Ambulatory Visit: Payer: BLUE CROSS/BLUE SHIELD | Attending: Neurology | Admitting: Neurology

## 2023-05-25 ENCOUNTER — Encounter: Payer: Self-pay | Admitting: Neurology

## 2023-05-25 ENCOUNTER — Encounter: Payer: Self-pay | Admitting: Internal Medicine

## 2023-05-25 VITALS — BP 125/68 | HR 107 | Resp 18 | Ht 68.0 in | Wt 321.0 lb

## 2023-05-25 DIAGNOSIS — M0600 Rheumatoid arthritis without rheumatoid factor, unspecified site: Secondary | ICD-10-CM

## 2023-05-25 DIAGNOSIS — M352 Behcet's disease: Secondary | ICD-10-CM

## 2023-05-25 DIAGNOSIS — G43711 Chronic migraine without aura, intractable, with status migrainosus: Secondary | ICD-10-CM

## 2023-05-25 DIAGNOSIS — D84821 Immunodeficiency due to drugs: Secondary | ICD-10-CM

## 2023-05-25 DIAGNOSIS — G43709 Chronic migraine without aura, not intractable, without status migrainosus: Secondary | ICD-10-CM

## 2023-05-25 DIAGNOSIS — H209 Unspecified iridocyclitis: Secondary | ICD-10-CM

## 2023-05-25 DIAGNOSIS — R569 Unspecified convulsions: Secondary | ICD-10-CM

## 2023-05-25 DIAGNOSIS — Z79899 Other long term (current) drug therapy: Secondary | ICD-10-CM

## 2023-05-25 MED ORDER — DIVALPROEX SODIUM ER 500 MG PO TB24
1000.0000 mg | ORAL_TABLET | Freq: Every day | ORAL | 3 refills | Status: DC
Start: 2023-05-25 — End: 2023-06-02

## 2023-05-25 MED ORDER — LACOSAMIDE 200 MG PO TABS
200.0000 mg | ORAL_TABLET | Freq: Two times a day (BID) | ORAL | 3 refills | Status: DC
Start: 2023-05-25 — End: 2023-12-23

## 2023-05-25 NOTE — Progress Notes (Signed)
 [facility]  Main Line / Scheduling / on-call: [phone]  Clinic Tel: [phone]  Fax: [phone]  Send us  a mychart or inbasket message for the fastest response  ~~~~~~~~~~~~~~~~~~~~~~~~~~~~~~~~~~~~~~~~~~~~~~~~~~~~~~~~~~~~~~~~~~~~~~~~~~~~~~~~~~~~    CC: CNS behcet's    HPI:   History was obtained from records review, patient,     41 y.o. year-old female with CNS Bechet's    Still with very frequent headaches.    Still with brief seizures with amnesia. Poor short term memory. Occurs at least 1x/week  Some tremors    Heat induced presyncope  Orthostatic presyncope, tachycardia - could be dysautonomia    EXAM  Visit Vitals  BP 125/68 (BP Site: Left arm, Patient Position: Sitting, Cuff Size: X-Large)   Pulse (!) 107   Resp 18   Ht 1.727 m (5' 8)   Wt 145.6 kg (321 lb)   LMP  (LMP Unknown)   SpO2 92%   BMI 48.81 kg/m     General:   Fundoscopic:   Psychiatric.   Mental Status: The patient was awake, alert, appropriate  Cranial Nerves: CN II-XII   Motor:   Reflex:   Sensation:   Coordination:   Gait:       Imaging     Reports:  MRI Brain W WO Contrast    Result Date: 03/07/2023  Impression:   No intracranial mass, acute hemorrhage, acute infarction, or evidence of hydrocephalus. Normal MRI of the brain. Electronically signed by: Gladis Minks M.D. [facility] MK: 03/07/23    MRI Brain W WO Contrast    Result Date: 01/28/2023  Impression:  No acute intracranial or orbital abnormality. Milan Arne Jumbo, MD 01/28/2023 7:57 AM    CT Head without Contrast    Result Date: 01/27/2023  Impression:   1.No intracranial abnormality is seen. 2. Given the history and symptoms, follow-up MRI may be of benefit. Belvie Lack, MD 01/27/2023 2:29 PM    MRI Lumbar Spine WO Contrast    Result Date: 12/19/2022  Impression:  1.  No acute osseous or soft tissue abnormality. 2.  Normal MR appearance of the distal spinal cord, conus medullaris and  cauda equina. 3.  Rotatory lumbar levoscoliosis. 4.  Lumbar spondylosis and facet arthrosis without central stenosis or nerve root compression. Please see full details and segmental analysis above. Electronically signed by: Sherlean Flank M.D. [facility] CM: 12/19/22       Testing       Labs  Lab Results   Component Value Date    HGBA1C 5.4 01/28/2023    EGFR >60.0 05/01/2023    CREAT 0.8 05/01/2023    WBC 12.45 (H) 05/01/2023    LYMPHOABS 2.5 07/25/2022    IGG 950 02/20/2023    IGA 216 01/25/2021    IGM 198 01/25/2021    B12 297 01/27/2023    VITD 28 (L) 01/23/2023       ASSESSMENT / PLAN  RTC: Return in about 6 weeks (around 07/06/2023) for [place].    1. Behcet's disease w/ CNS involvement    2. Seizure-like activity    3. Intractable chronic migraine without aura and with status migrainosus    4. Chronic migraine without aura without status migrainosus, not intractable    5. Seizure    6. Behcet recurrent disease    7. Anterior uveitis    8. Seronegative rheumatoid arthritis    9. Immunocompromised state due to drug therapy  41y/o F with  Bechet, neuro behcet. Intractable migraine. Still w/ recurring seizures. Both can contribute to cognitve difficulty.     Fortunately ESR is coming down. MRI brain did not show lesions, enhancement or inflammation    PLAN  - Agree w/ infliximab  5mg /kg q 8 weeks; no ADA's; check w/ each infusion  - Agree w/ otezla   - Mtx 5mg  weekly; note that it can decrease vpa levels. Vpa does not impact mtx level. Ok to increase mtx level and we will continue to monitor vpa levels periodically (with each infusion)  - Prednisone  10mg  qam; continue for now at this dose until she feels better  - Increase lacosamide  200mg  bid for seizures; check level 8/19th   - Increase vpa 1000mg  bid; check level with each infusion q8 weeks    Headache mgt per Lea Armisted    In future: Can consider tilt table test to evaluate for dysautonomia   Low value for LP to evaluate for  intrathecal inflammation until headaches and seizures are under control       Orders today (details below)  No orders of the defined types were placed in this encounter.    Orders Placed This Encounter   Medications    Lacosamide  (VIMPAT ) 200 MG Tab     Sig: Take 1 tablet (200 mg) by mouth 2 (two) times daily     Dispense:  180 tablet     Refill:  3    divalproex , ER, extended release (DEPAKOTE  ER) 500 MG 24 hr tablet     Sig: Take 2 tablets (1,000 mg) by mouth daily     Dispense:  180 tablet     Refill:  3       I spent 40 minutes reviewing the records, talking with the patient and developing their care plan. This excludes time for separately billed procedures.      Sammi Lockwood, MD PHD  Director, Neuroimmunology & MS Center  [facility]  [address], [address], [place], [place] [address]    Main line / scheduling: [phone]    After-hours Neurologist On Call: [phone]    For direct access, contact our MS Nurse, Channing Basket RN  The fastest response is via mychart  Tel [phone]  Fax [phone]   ===================================================================  NFL (neurofilament light chain) Insurance Justification  Serum / plasma Neurofilament light chain is a well-established biomarker for monitoring MS, and regular testing has been incorporated into the Consortium of MS Centers [facility]) guidelines for the monitoring of MS. https://mscare.http://www.stevens.com/     PATIENT INSTRUCTIONS PROVIDED  Patient was given an After Visit Summary with a copy of the testing orders, medications and the following other instructions:  There are no Patient Instructions on file for this visit.    IMPORTED INFORMATION FROM MEDICAL RECORDS   Diagnosis ICD-10-CM Associated Order   1. Behcet's disease w/ CNS involvement  M35.2       2. Seizure-like activity  R56.9       3. Intractable chronic migraine without  aura and with status migrainosus  G43.711       4. Chronic migraine without aura without status migrainosus, not intractable  G43.709       5. Seizure  R56.9       6. Behcet recurrent disease  M35.2       7. Anterior uveitis  H20.9       8. Seronegative rheumatoid arthritis  M06.00       9. Immunocompromised state due to drug therapy  D84.821     Z79.899         No orders of the defined types were placed in this encounter.

## 2023-05-26 ENCOUNTER — Other Ambulatory Visit (INDEPENDENT_AMBULATORY_CARE_PROVIDER_SITE_OTHER): Payer: Self-pay | Admitting: Internal Medicine

## 2023-05-26 ENCOUNTER — Encounter: Payer: Self-pay | Admitting: Neurology

## 2023-05-26 DIAGNOSIS — M0600 Rheumatoid arthritis without rheumatoid factor, unspecified site: Secondary | ICD-10-CM

## 2023-05-26 DIAGNOSIS — R569 Unspecified convulsions: Secondary | ICD-10-CM

## 2023-05-26 DIAGNOSIS — M352 Behcet's disease: Secondary | ICD-10-CM

## 2023-05-26 DIAGNOSIS — K120 Recurrent oral aphthae: Secondary | ICD-10-CM

## 2023-05-26 DIAGNOSIS — K760 Fatty (change of) liver, not elsewhere classified: Secondary | ICD-10-CM

## 2023-05-26 DIAGNOSIS — G43711 Chronic migraine without aura, intractable, with status migrainosus: Secondary | ICD-10-CM

## 2023-06-02 MED ORDER — DIVALPROEX SODIUM ER 500 MG PO TB24
1000.0000 mg | ORAL_TABLET | Freq: Two times a day (BID) | ORAL | 3 refills | Status: DC
Start: 2023-06-02 — End: 2023-12-27

## 2023-06-07 ENCOUNTER — Encounter: Payer: Self-pay | Admitting: No Specialty

## 2023-06-21 ENCOUNTER — Encounter: Payer: Self-pay | Admitting: Internal Medicine

## 2023-06-22 ENCOUNTER — Encounter: Payer: Self-pay | Admitting: Internal Medicine

## 2023-06-26 ENCOUNTER — Other Ambulatory Visit (INDEPENDENT_AMBULATORY_CARE_PROVIDER_SITE_OTHER): Payer: BLUE CROSS/BLUE SHIELD

## 2023-06-26 ENCOUNTER — Ambulatory Visit: Payer: BLUE CROSS/BLUE SHIELD | Attending: Anatomic Pathology & Clinical Pathology

## 2023-06-26 ENCOUNTER — Encounter: Payer: Self-pay | Admitting: Internal Medicine

## 2023-06-26 VITALS — BP 113/75 | HR 86 | Temp 98.0°F | Resp 20 | Wt 328.0 lb

## 2023-06-26 DIAGNOSIS — M0600 Rheumatoid arthritis without rheumatoid factor, unspecified site: Secondary | ICD-10-CM

## 2023-06-26 DIAGNOSIS — M352 Behcet's disease: Secondary | ICD-10-CM

## 2023-06-26 DIAGNOSIS — R569 Unspecified convulsions: Secondary | ICD-10-CM

## 2023-06-26 DIAGNOSIS — Z79899 Other long term (current) drug therapy: Secondary | ICD-10-CM

## 2023-06-26 DIAGNOSIS — H209 Unspecified iridocyclitis: Secondary | ICD-10-CM | POA: Insufficient documentation

## 2023-06-26 LAB — LAB USE ONLY - CBC WITH DIFFERENTIAL
Absolute Basophils: 0.03 10*3/uL (ref 0.00–0.08)
Absolute Eosinophils: 0.1 10*3/uL (ref 0.00–0.44)
Absolute Immature Granulocytes: 0.1 10*3/uL — ABNORMAL HIGH (ref 0.00–0.07)
Absolute Lymphocytes: 4.15 10*3/uL — ABNORMAL HIGH (ref 0.42–3.22)
Absolute Monocytes: 0.92 10*3/uL — ABNORMAL HIGH (ref 0.21–0.85)
Absolute Neutrophils: 6.19 10*3/uL (ref 1.10–6.33)
Absolute nRBC: 0 10*3/uL (ref <=0.00)
Basophils %: 0.3 %
Eosinophils %: 0.9 %
Hematocrit: 40.9 % (ref 34.7–43.7)
Hemoglobin: 14.2 g/dL (ref 11.4–14.8)
Immature Granulocytes %: 0.9 %
Lymphocytes %: 36.1 %
MCH: 33.3 pg (ref 25.1–33.5)
MCHC: 34.7 g/dL (ref 31.5–35.8)
MCV: 96 fL (ref 78.0–96.0)
MPV: 8.5 fL — ABNORMAL LOW (ref 8.9–12.5)
Monocytes %: 8 %
Neutrophils %: 53.8 %
Platelet Count: 294 10*3/uL (ref 142–346)
Preliminary Absolute Neutrophil Count: 6.19 10*3/uL (ref 1.10–6.33)
RBC: 4.26 10*6/uL (ref 3.90–5.10)
RDW: 13 % (ref 11–15)
WBC: 11.49 10*3/uL — ABNORMAL HIGH (ref 3.10–9.50)
nRBC %: 0 /100 WBC (ref <=0.0)

## 2023-06-26 LAB — COMPREHENSIVE METABOLIC PANEL
ALT: 93 U/L — ABNORMAL HIGH (ref 0–55)
AST (SGOT): 53 U/L — ABNORMAL HIGH (ref 5–41)
Albumin/Globulin Ratio: 1.1 (ref 0.9–2.2)
Albumin: 3.8 g/dL (ref 3.5–5.0)
Alkaline Phosphatase: 40 U/L (ref 37–117)
Anion Gap: 10 (ref 5.0–15.0)
BUN: 16 mg/dL (ref 7–21)
Bilirubin, Total: 0.4 mg/dL (ref 0.2–1.2)
CO2: 25 mEq/L (ref 17–29)
Calcium: 9.7 mg/dL (ref 8.5–10.5)
Chloride: 105 mEq/L (ref 99–111)
Creatinine: 0.8 mg/dL (ref 0.4–1.0)
GFR: >60 mL/min/{1.73_m2} (ref >=60.0)
Globulin: 3.4 g/dL (ref 2.0–3.6)
Glucose: 96 mg/dL (ref 70–100)
Hemolysis Index: 29 Index
Potassium: 4 mEq/L (ref 3.5–5.3)
Protein, Total: 7.2 g/dL (ref 6.0–8.3)
Sodium: 140 mEq/L (ref 135–145)

## 2023-06-26 LAB — C-REACTIVE PROTEIN: C-Reactive Protein: <0.1 mg/dL (ref 0.0–1.1)

## 2023-06-26 LAB — SEDIMENTATION RATE: Sed Rate: 8 mm/Hr (ref <=20)

## 2023-06-26 MED ORDER — DIPHENHYDRAMINE HCL 50 MG/ML IJ SOLN
25.0000 mg | Freq: Once | INTRAMUSCULAR | Status: AC
Start: 2023-06-26 — End: 2023-06-26
  Administered 2023-06-26: 25 mg via INTRAVENOUS
  Filled 2023-06-26: qty 1

## 2023-06-26 MED ORDER — METHYLPREDNISOLONE SODIUM SUCC 40 MG IJ SOLR (WRAP)
40.0000 mg | Freq: Once | INTRAMUSCULAR | Status: AC
Start: 2023-06-26 — End: 2023-06-26
  Administered 2023-06-26: 40 mg via INTRAVENOUS
  Filled 2023-06-26: qty 1

## 2023-06-26 MED ORDER — SODIUM CHLORIDE (PF) 0.9 % IJ SOLN
5.0000 mL | INTRAMUSCULAR | Status: DC | PRN
Start: 2023-06-26 — End: 2023-06-26
  Administered 2023-06-26: 10 mL via INTRAVENOUS

## 2023-06-26 MED ORDER — SODIUM CHLORIDE 0.9 % IV SOLN
700.0000 mg | Freq: Once | INTRAVENOUS | Status: AC
Start: 2023-06-26 — End: 2023-06-26
  Administered 2023-06-26: 700 mg via INTRAVENOUS
  Filled 2023-06-26: qty 700

## 2023-06-26 MED ORDER — SODIUM CHLORIDE 0.9 % IV SOLN
250.0000 mL | INTRAVENOUS | Status: DC
Start: 2023-06-26 — End: 2023-06-26
  Administered 2023-06-26: 250 mL via INTRAVENOUS

## 2023-06-26 MED ORDER — ACETAMINOPHEN 325 MG PO TABS
650.0000 mg | ORAL_TABLET | Freq: Once | ORAL | Status: AC
Start: 2023-06-26 — End: 2023-06-26
  Administered 2023-06-26: 650 mg via ORAL
  Filled 2023-06-26: qty 2

## 2023-06-26 NOTE — Progress Notes (Signed)
[facility]   Visit Date: 06/26/2023    14:00    Hailey [name] is a 42 y.o. female patient of DR Debe Coder. Since her last visit, she has been doing fair. Patient arrived c/o of headache and HTN.   Patient presents to the infusion center for Infliximab infusions, every 8 weeks.  Overall, she states her energy level is good.  The patient is able to perform most usual functions.. Her increased appetite with an associated weight gain of 6 lbs.. She reports fatigue, nausea, pain - headaches rated as 6, on a scale 1-10, and weight gain. She has no other concerns. There are no social work or other referral needs at this time.    Diagnosis:  RA, anterior uveitis, behcet disease, seizure  Treatment on Clinical Trial: Not On Study (NOS)  [No matching plan found]      IV Pre Hydration: N/A  Urine Parameters:NA  Pre-Medications: acetaminophen (Tylenol) and diphenhydramine (Benadryl), Solumedrol    Line Type: Peripheral IV:  Left arm, inserted by Kara Mead  Blood Return verified prior to administration: Yes      Cryotherapy: No        Vital Signs:  Patient Vitals for the past 12 hrs:   BP Temp Pulse Resp   06/26/23 1646 113/75 -- 86 --   06/26/23 1612 114/75 -- 87 20   06/26/23 1540 129/84 -- 90 20   06/26/23 1509 136/82 -- 92 20   06/26/23 1404 141/90 -- 100 20   06/26/23 1354 (!) 152/105 -- 94 --   06/26/23 1345 (!) 153/108 98 F (36.7 C) (!) 110 20      Body surface area is 2.67 meters squared.    '  Chemistry        Component Value Date/Time    NA 140 06/26/2023 1330    NA 139 03/06/2023 1355    K 4.0 06/26/2023 1330    K 4.0 03/06/2023 1355    CL 105 06/26/2023 1330    CL 108 03/06/2023 1355    CO2 25 06/26/2023 1330    CO2 25 03/06/2023 1355    BUN 16 06/26/2023 1330    BUN 8.0 03/06/2023 1355    CREAT 0.8 06/26/2023 1330    CREAT 0.7 03/06/2023 1355    CREAT 0.72 07/25/2022 1133    GLU 96 06/26/2023 1330    GLU 105 (H) 03/06/2023 1355        Component Value Date/Time     CA 9.7 06/26/2023 1330    CA 9.6 03/06/2023 1355    ALKPHOS 40 06/26/2023 1330    ALKPHOS 60 03/06/2023 1355    AST 53 (H) 06/26/2023 1330    AST 30 03/06/2023 1355    ALT 93 (H) 06/26/2023 1330    ALT 39 03/06/2023 1355    BILITOTAL 0.4 06/26/2023 1330    BILITOTAL 0.3 03/06/2023 1355            Lab Results   Component Value Date    WBC 11.49 (H) 06/26/2023    HGB 14.2 06/26/2023    PLT 294 06/26/2023    NEUTROABS 6.19 06/26/2023       Administrations This Visit       0.9% NaCl infusion       Admin Date  06/26/2023  14:20 Action  New Bag Dose  250 mL Rate  20 mL/hr Route  Intravenous Ordering Provider  Baltazar Najjar, MD  acetaminophen (TYLENOL) tablet 650 mg       Admin Date  06/26/2023  14:18 Action  Given Dose  650 mg Route  Oral Ordering Provider  Baltazar Najjar, MD              diphenhydrAMINE (BENADRYL) injection 25 mg       Admin Date  06/26/2023  14:18 Action  Given Dose  25 mg Route  Intravenous Ordering Provider  Baltazar Najjar, MD              inFLIXimab-axxq (AVSOLA) 700 mg in sodium chloride 0.9 % 250 mL infusion       Admin Date  06/26/2023  14:39 Action  New Bag Dose  700 mg Route  Intravenous Ordering Provider  Baltazar Najjar, MD              methylPREDNISolone sodium succinate (Solu-MEDROL) injection 40 mg       Admin Date  06/26/2023  14:20 Action  Given Dose  40 mg Route  Intravenous Ordering Provider  Baltazar Najjar, MD                    Chemo Given within the past 12 hours:   "Chemo/Biotherapy Given" (last 12 hours)       None              Hypersensitivity Reaction Noted: No  Initiated Infusion at 40 mL/hr for 30 min, take vital signs;  Increased to 80 mL/hr for 30 min, take vital signs; Increased to 150 mL/hr for 30 min, take vital signs; Increased to 250 mL/hr for 30 min or until completion    Infliximab Infused with a low protein-binding in-line filter     IV Post Hydration: No, not required  Treatment Outcome:Patient tolerated treatment well.  Blood return verified  after administration: Yes  PIV  Line flushed with 10 cc NS flush before and after treatment and discontinued at the end of treatment..    Education: Patient/Family educated regarding the expected outcomes/side effects possible interactions of treatments and medications. Patient verbalizes understanding.    Follow-up Plan: 16:52 Discharged in stable condition. Accompanied by, spouse. Instructed to contact physician if any complications or unmanageable symptoms occur.    Patient will follow-up with their physician.  A return to the Infusion Center for the next treatment, has been scheduled.      RTC 10.14.2024 for more Infliximab    Tracie Harrier, RN, MSN, CRNI    06/26/2023  4:52 PM

## 2023-06-27 ENCOUNTER — Encounter: Payer: Self-pay | Admitting: No Specialty

## 2023-06-28 LAB — VALPROIC ACID LEVEL, TOTAL AND FREE
Valproic Acid Free: 7.3 mg/L (ref 4.8–17.3)
Valproic Acid Level: 75 mg/L (ref 50.0–100.0)

## 2023-06-30 ENCOUNTER — Encounter (INDEPENDENT_AMBULATORY_CARE_PROVIDER_SITE_OTHER): Payer: Self-pay | Admitting: Internal Medicine

## 2023-06-30 ENCOUNTER — Telehealth (INDEPENDENT_AMBULATORY_CARE_PROVIDER_SITE_OTHER): Payer: BLUE CROSS/BLUE SHIELD | Admitting: Internal Medicine

## 2023-06-30 DIAGNOSIS — Z79899 Other long term (current) drug therapy: Secondary | ICD-10-CM

## 2023-06-30 DIAGNOSIS — G8929 Other chronic pain: Secondary | ICD-10-CM

## 2023-06-30 DIAGNOSIS — M25572 Pain in left ankle and joints of left foot: Secondary | ICD-10-CM

## 2023-06-30 DIAGNOSIS — H209 Unspecified iridocyclitis: Secondary | ICD-10-CM

## 2023-06-30 DIAGNOSIS — M79672 Pain in left foot: Secondary | ICD-10-CM

## 2023-06-30 DIAGNOSIS — K760 Fatty (change of) liver, not elsewhere classified: Secondary | ICD-10-CM

## 2023-06-30 DIAGNOSIS — M79671 Pain in right foot: Secondary | ICD-10-CM

## 2023-06-30 DIAGNOSIS — M0600 Rheumatoid arthritis without rheumatoid factor, unspecified site: Secondary | ICD-10-CM

## 2023-06-30 DIAGNOSIS — M352 Behcet's disease: Secondary | ICD-10-CM

## 2023-06-30 DIAGNOSIS — M25571 Pain in right ankle and joints of right foot: Secondary | ICD-10-CM

## 2023-06-30 NOTE — Patient Instructions (Signed)
Dear Hailey [name] ,     Thanks for arranging a video visit with me.    Here are things I'd like you to do following today's visit:  Please have blood taken for labs in the next 1-2 months.     Please schedule ultrasounds of feet and ankles.    You may retrieve any orders from this visit in Mychart by using these steps:    After signing into Mychart  1) click Messaging  2) click Letters  3) select the Requisition  4) Review and click print    Please contact the clinic at [phone] with any questions and to schedule your follow up visit.    Sincerely,    Debe Coder, MD  Rheumatologist  [facility]  [address]  [place], [place] [address]  Phone: [phone]  Fax: [phone]

## 2023-06-30 NOTE — Progress Notes (Signed)
RHEUMATOLOGY FOLLOW UP TELEMEDICINE NOTE  Telemedicine Documentation Requirements  Originating site (Patient location): home  Distant site (Provider location): Office  Provider and Title: Dr. Adin Hector  Type of Visit: Video Visit  Verbal Consent has been obtained to conduct a telemedicine visit on this date in order to minimize exposure to COVID-19.        PCP: Virgilio Frees, MD    HPI:    This patient is a 42 y.o. year female with Past Medical History of Prior Cholecystectomy, Wold-Parkinson White, Prior Hysterectomy who returns for follow up of Behcet's HLAB51+, recurrent oral ulcers, pathergy, low grade fevers and seronegative arthritis.  -ablation procedure for arrythmia 06/2022  -She is on Humria weekly for inflammatory arthritis from Behcets  [facility] [place] for recurrent aphthous ulcers  -She is on dapsone from Dermatology, clindamycin at night  Dermatologist started her on dapsone-she has had a few scars that are healing-had injections from her dermatologist.  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 Shellee Milo      Interval History:  Last visit 05/19/23    She reports seizure since last visit-She saw Dr. Theodoro Grist 7/18-he increased depakote, vimpat, lacosamide.    She is having mood changes.  Headaches.    She still has blurry vision.    She is following with ophthalmology  They updated her prescription.    Dr. Drucilla Schmidt says uveitis is well controlled.  She was told eye pressure is up-on gluacoma drops.    She reports nystagmus of the center that is new the last few weeks, she has had ongoing alteral nystagmus.      No issues with Otezla, no ulcers.  Persistnet ankle swelling, no calf pain, no leg swelling    She is on prednisone 10 mg in the morning per Neurology.     Labs Reviewed:  06/26/23  Inflizimab level pending  Valproic Acid level 75  CRP wnl  ESR wnl  CMP AST/ALT 53/93    She is on prednisone 10 mg in the morning per Neurology.     Labs Reviewed:  05/01/23  Infliximab level wnl  CRP  wnl  ESR 14  CMP wnl  CBC wnl        Previous Imaging:  03/01/23  MRI Brain:  FINDINGS:      The brain parenchyma demonstrates no significant abnormality. There is no  abnormal parenchymal enhancement. No parenchymal mass lesion, acute  parenchymal hemorrhage, or acute infarction.     The ventricles, sulci, and cisterns are unremarkable in appearance.     The flow voids of the major intracranial vessels appear intact.     The bones and extracranial soft tissues are unremarkable.     IMPRESSION:      No intracranial mass, acute hemorrhage, acute infarction, or evidence of  hydrocephalus. Normal MRI of the brain    02/10/23:  Neck US:  Narrative & Impression   HISTORY: Neck swelling. Enlarged cervical lymph nodes.      COMPARISON: 01/28/2023     TECHNIQUE: Targeted soft tissue ultrasound of the area of concern, in the  area of both parotid glands and at level 1.     FINDINGS:   No enlarged or otherwise abnormal appearing lymph node, other mass, fluid  collection, or other focal abnormality. The parotid glands appear symmetric  and normal in echotexture with no discrete mass.     IMPRESSION:      Normal exam  Previous Labs:  03/21/23  Surgical pathology A.  GASTRIC BIOPSY:          MINIMAL SUPERFICIAL CHRONIC INACTIVE INFLAMMATION, OTHERWISE     UNREMARKABLE; H. PYLORI NOT IDENTIFIED ON H&E STAIN        B.  COLON RANDOM BIOPS:     UNREMARKABLE COLONIC MUCOSA     03/06/23  CRP wnl  ESR 35  CMP wnl  CBC unremarkable    Previous Labs:  02/06/23  CRP wnl  ESR 24  CMP wnl  CBC WBC 10.96    Previous Labs:  11/25/22  Procalcitonin wnl  CMP wnl  CBC unremarkable      Previous Labs:  08/29/22  CBC platelets 350  BMP unremarkable  ESR wnl  CRP wnl      Imaging Reports Reviewed:  12/16/22  MRI Lumbar Spine:  IMPRESSION:         1.  No acute osseous or soft tissue abnormality.  2.  Normal MR appearance of the distal spinal cord, conus medullaris and  cauda equina.  3.  Rotatory lumbar levoscoliosis.  4.  Lumbar spondylosis and facet  arthrosis without central stenosis or  nerve root compression. Please see full details and segmental analysis  above.    CT Chest Angio:  11/23/22  FINDINGS:      LINES/TUBES: None.     LUNGS: No consolidation, edema or mass. There is linear atelectasis at the  lung bases.  PLEURA: No pleural effusions or pneumothorax.     HEART: Not enlarged.  MEDIASTINUM: No axillary, hilar or mediastinal lymphadenopathy.  PULMONARY ARTERIES: No acute pulmonary emboli.  AORTA: No aneurysm or dissection.     UPPER ABDOMEN: Hepatic steatosis. No acute abnormality.     BONES AND SOFT TISSUES: Unremarkable.     IMPRESSION:      No acute pulmonary embolism.    CT A/P  11/23/22:  FINDINGS:      LINES/TUBES: None.     LOWER THORAX: Bibasilar atelectasis.     LIVER/BILIARY TREE: The gallbladder is absent. There is hepatic steatosis.  No biliary dilation.  SPLEEN: No splenomegaly.  PANCREAS: No pancreatic mass or duct dilatation.     KIDNEYS/URETERS: No hydronephrosis, stones or solid mass lesions.  ADRENALS: No adrenal mass.  PELVIC ORGANS/BLADDER: No pelvic masses. The bladder is partially  distended.     PERITONEUM/RETROPERITONEUM: No free air or fluid.  LYMPH NODES: No lymphadenopathy.  VESSELS: No aortic aneurysm.     GI TRACT: No bowel wall thickening or dilation. Normal appendix.     BONES AND SOFT TISSUES: There are degenerative changes in the spine. No  suspicious or destructive osseous lesion.     IMPRESSION:      No acute abnormality.    Previous Imaging:      09/19/22:  ECHO:  Left Ventricle    The left ventricle is normal in size. There is normal left ventricular  geometry. Left ventricular systolic function is normal with an ejection  fraction by Biplane Method of Discs of  60 %.  3D LVEF 58%. Left ventricular  segmental wall motion is normal. Left ventricular diastolic filling parameters  are consistent with Grade I diastolic dysfunction (impaired relaxation  pattern).  Right Ventricle    The right ventricular cavity size is  normal in size. Normal right  ventricular systolic function.    08/27/22  MRI left Hip:  IMPRESSION:      1.Trace left hip joint effusion without significant synovitis. Tear of  the  left anterosuperior acetabular labrum. Otherwise unremarkable left hip  joint.     2.Mild to moderate left distal gluteus minimus tendinosis and mild to  moderate left greater trochanteric bursitis with bursal edema. There is  mild left proximal hamstring tendinosis.      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     4.There has been interval enlargement of a left adnexal cyst since pelvic  ultrasound 03/18/2022, now measuring 3.3 cm. This could be further  evaluated with follow-up pelvic ultrasound.     5.Unchanged 3.8 cm cyst adjacent to the vaginal cuff post hysterectomy  since multiple prior exams favoring a benign etiology.      Previous Labs:  07/25/22  ESR 37  CRP wnl  CMP ALt 39  CBC wnl      Imaging Reports Reviewed:  04/12/22  Cardiac MRI:  IMPRESSION:       1. No delayed gadolinium enhancement to suggest myocardial fibrosis or  scarring.  2. Normal right ventricular systolic function.  3. Qualitatively mild global left ventricular hypokinesis. Left ventricular  systolic function measures 32% and may be artifactually reduced secondary  to significant motion artifact.    Labs Reviewed:  04/19/22  Throat culture no B-hemolytic strep  POCT flu A/B  Abbot -      Previous Labs:  01/14/22  CMP wnl  CRP wnl  ViT D 31  ESR wnl  Lipid panel wnl  TSH wnl      08/18/21  CBC wnl  CMP wnl  ESR wnl  CRP wnl    Previous Labs:  05/25/21  Quant Gold-  HepbSAb wnl  HepbSAg-  HepCAb-  HIV-        Previous Labs:  02/23/21  ESR wnl  CRP wnl  CMP ALT 42, AST normal    CRP wnl  ESR wnl  HLAB51 positive  IgE, IgA, IGG, IgM all wnl  CBC wnl    12/03/20  DIAGNOSIS:          A. Lesion, ulnar wrist, excision:     -Fibroadipose tissue and vasculature          B. Synovial tissue, biopsy:     -Fibroadipose tissue and synovium (see comment)           Comment:     Morphologic features typical of rheumatoid arthritis or infection are     not identified.    12/03/20  Bacterial Cultures: No Growth  Fungal Cultures: NG at one week  AFB: No growth    11/20/20  ESR 50  CRP wnl  IgG subclasses wnl  ANA comprehensive panel negative        Previous Labs:    [facility] Labs:  10/23/20  C-ANCA, P-ANCA, MPO, PR3 all negative  Mitochondrial Ab-  ANA IFA-  Scl70-  Centromere-  RNP-  SMith-  TPPA Ab-      05/18/20  Ferritin 291  CMP AST/A:T 59/70  CBC elevated lymphocytes    02/06/20  MPO-  PR3-  SSA-  aCL IgM 14  aCL IgG, IgA -  Lupus anticoagulant -  B2IgG, IgM -  ACE wnl  CCP-  RF-  Vit D 31  TSH wnl  ESR wnl  CK wnl  C4 wnl  C3 wnl  CRP wnl    07/2019  ANA Screen-  LDH wnl    07/23/2018 lymph node biopsy:DIAGNOSIS:          Lymph node, deep right neck, excisional  biopsy:     -Lymph node with non-specific reactive changes   Wound, AFB and Fungal cultures negative at this time    BONE MARROW 05/03/2019      BONE MARROW, LEFT POSTERIOR ILIAC CREST, CORE BIOPSY, CLOT SECTION,    ASPIRATE SMEAR AND TOUCH PREP:        - NORMOCELLULAR BONE MARROW (approximate50-60%) WITH TRILINEAGE HEMATOPOIESIS,  NORMAL MYELOID TO ERYTHROID RATIO AND ADEQUATE NUMBERS OF    MEGAKARYOCYTES WIT H UNREMARKABLE MORPHOLOGY        - A FEW SMALL REACTIVE LYMPHOID AGGREGATES IDENTIFIED, AND MILDLY  INCREASED NUMBERS OF EOSINOPHILS        - PENDING CYTOGENETIC STUDIES OF BONE MARROW SAMPLE  - Remain pending as of 05/14/2019.        COMMENT:    The concurrent flow cytometry performed on the bone marrow aspirate  sample shows no evidence of immunophenotypic abnormalities      Imaging Reviewed:    08/16/20  MRI Brain:IMPRESSION:      1. No intracranial mass, hemorrhage, or hydrocephalus is detected.  2. There is normal pattern of enhancement of the brain parenchyma and  meningeal surfaces.    9/18 MRI Abdomen:  IMPRESSION:      1. Diffuse hepatic steatosis. No suspicious focal hepatic lesions.  2. No biliary  ductal dilatation or choledocholithiasis.    02/03/20  MRI Pelvis:  IMPRESSION:         Cystic structure at the vaginal cuff, flank by both ovaries not  significantly changed in size compared to 2019. No abnormal internal  enhancement. This favors a benign process such as a peritoneal inclusion  cyst or a paraovarian cyst.    12/14/19 MRI R Hand:  IMPRESSION:      1.  No suspicious soft tissue mass, osseous mass, or masslike  enhancement in the right hand or wrist to correlate with the diffuse FDG  uptake on recent PET/CT.  2.  There is mild enhancing synovitis in the right wrist joint without  active joint erosion or bony destruction.  3.  A small dorsal ganglion cyst overlying the scapholunate interval.    12/06/19  Full Body PET Scan:    Hypermetabolic soft tissue mass in palmar R hand surface at base of the second digit-stable pulmonary nodulke 1.3 cm left lower lobe-recommend follow up Chest CT 3 months         The following sections were reviewed this encounter by the provider:   Tobacco  Allergies  Meds  Problems  Med Hx  Surg Hx  Fam Hx         PMH/PSH:  Past Medical History:   Diagnosis Date    Abnormal vision     reading glasses    Anxiety     Arrhythmia     WPW ablation in 2005, 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 Released from cardiology    Behcet's disease     She sees Dr. 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    02/11/23 stable on medical    Diarrhea     Ear, nose and throat disorder     seasonal allergies    Fatigue     Bechet's    Fever 11/09/2022    Headache     migraine    Hx of ovarian cyst     Lower back pain     not severe    Meningitis 2011    Viral // treated and resolved  Pain in wrist 11/09/2022    Post-operative nausea and vomiting     Scoliosis     moderate// used a Brace at a young age    Uveitis     using eyedrops        Past Surgical History:   Procedure Laterality Date    ABLATION - PVC'S N/A 06/08/2022    Procedure: Ablation - PVC's;  Surgeon: [name], MD;  Location: [facility];   Service: Cardiovascular;  Laterality: N/A;  SAME DAY DISCHARGE  CARTO NO TEE    ARTHROTOMY, WRIST Right 12/03/2020    Procedure: RIGHT UPPER EXTREMITY SYNOVIAL BIOPSY;  Surgeon: Kellie Simmering, MD;  Location: [facility];  Service: Plastics;  Laterality: Right;    BIOPSY, LYMPH NODE N/A 07/23/2018    Procedure: BIOPSY, LYMPH NODE;  Surgeon: Shelda Pal, MD;  Location: [facility];  Service: ENT;  Laterality: N/A;  NECK DEEP LYMPH NODE BIOPSY    BONE MARROW BIOPSY  2020    led to behcet's dx    CARDIAC ABLATION  2005    [facility] for Wolff-Parkinson-White    CHOLECYSTECTOMY  2009    COLONOSCOPY, DIAGNOSTIC (SCREENING) N/A 03/21/2023    Procedure: COLONOSCOPY, DIAGNOSTIC (SCREENING);  Surgeon: Darlyn Chamber, MD;  Location: [facility];  Service: Gastroenterology;  Laterality: N/A;    DILATION AND CURETTAGE OF UTERUS  2010    EGD N/A 03/21/2023    Procedure: EGD;  Surgeon: Darlyn Chamber, MD;  Location: [facility];  Service: Gastroenterology;  Laterality: N/A;    FINGER GANGLION CYST EXCISION  04/2018    GANGLION CYST EXCISION  2014    04/2018 cyst removed from index finger-[facility]    HYSTERECTOMY  2012    HYSTEROSCOPY  2011    indication: Abnormal bleeding    LAPAROSCOPIC, LYSIS, ADHESIONS N/A 11/09/2022    Procedure: LAPAROSCOPIC LYSIS OF ADHESIONS;  Surgeon: Devota Pace, MD;  Location: [facility];  Service: Gynecology;  Laterality: N/A;    LAPAROSCOPIC, SALPINGECTOMY Left 11/09/2022    Procedure: LAPAROSCOPIC, SALPINGECTOMY LEFT;  Surgeon: Devota Pace, MD;  Location: [facility];  Service: Gynecology;  Laterality: Left;    LAPAROSCOPY, DIAGNOSTIC N/A 11/09/2022    Procedure: LAPAROSCOPY, DIAGNOSTIC;  Surgeon: Devota Pace, MD;  Location: [facility];  Service: Gynecology;  Laterality: N/A;  [id number]    UMBILICAL HERNIA REPAIR  2016    UPPER GASTROINTESTINAL ENDOSCOPY  2009    UPPER GASTROINTESTINAL ENDOSCOPY  03/2023    WISDOM TOOTH  EXTRACTION  1998        FH/SH:  Family History   Adopted: Yes       Social History     Tobacco Use    Smoking status: Never     Passive exposure: Never    Smokeless tobacco: Never   Vaping Use    Vaping status: Never Used   Substance Use Topics    Alcohol use: Never    Drug use: Never        Meds/ Allergies:  No outpatient medications have been marked as taking for the 06/30/23 encounter (Telemedicine Visit) with Baltazar Najjar, MD.     Allergies   Allergen Reactions    Fruit Blend Flavor [Flavoring Agent] Anaphylaxis and Swelling     Tropical Fruit     Latex Hives, Swelling, Other (See Comments) and Drug-Induced Flushing    Adhesive [Wound Dressing Adhesive] Itching    Cyanoacrylate Itching  Other Itching and Rash     Sutures Monocryl    Oxycodone Hallucinations          PHYSICAL EXAM  General- WNWD, alert and oriented, NAD  Eyes- EOMI, PERRL, no conjunctival injection, no scleral icterus  ENMT- face symmetric without lesions  Skin- no rash, no alopecia  Pulm: No conversational dyspnea, breathing comfortable on room air  Neuro - no notable neurological deficits, no facial asymmetry        LABS:  Lab Results   Component Value Date    WBC 11.49 (H) 06/26/2023    HGB 14.2 06/26/2023    HCT 40.9 06/26/2023    MCV 96.0 06/26/2023    PLT 294 06/26/2023      Lab Results   Component Value Date    CREAT 0.8 06/26/2023    BUN 16 06/26/2023    NA 140 06/26/2023    K 4.0 06/26/2023    CL 105 06/26/2023    CO2 25 06/26/2023      Lab Results   Component Value Date    ALT 93 (H) 06/26/2023    AST 53 (H) 06/26/2023    ALKPHOS 40 06/26/2023    BILITOTAL 0.4 06/26/2023     Lab Results   Component Value Date    ESR 8 06/26/2023    ESR 14 05/01/2023    ESR 35 (H) 03/06/2023    ESR 24 (H) 02/06/2023    ESR 25 (H) 01/27/2023      Lab Results   Component Value Date    CRP <0.1 06/26/2023    CRP <0.1 05/01/2023    CRP <0.1 03/06/2023    CRP <0.1 02/06/2023    CRP <0.1 01/27/2023      No results found for: "ANA"   Lab Results    Component Value Date    RHEUMFACTOR <15.0 02/06/2020      Lab Results   Component Value Date    CCP <15.6 02/06/2020     No results found for: "URICACID"        ASSESSMENT/PLAN:    This patient is a 42 y.o. year female with Past Medical History of Prior Cholecystectomy, Wolf-Parkinson White, Prior Hysterectomy, Fatty Liver Disease who returns for follow up of Behcet's Disease and seronegative rheumatoid arthritis complicated with recent uveitis for which she was switched to Remicade from humira.    She will continue Remicade-on 5 mg/kg and will maintain on this for now, last dose 8/19.  She is on very low dose MTX 5 tabs weekly to prevent development of anti drug TNF antibodies.  Continue Otezla for oral uclers, inflamamtory arthropathy.    Recent increase in LFTs-will re-check at next infusion, would avoid change in MTX dose given concurrent anti-epileptics.    She has persistent neurologic symptoms-at this point defer to Dr. Theodoro Grist on medication management and if change in background Behcets medication needed.  Or if further work up indicated to pursue alternative etiologyiges for double vision, headaches, nystagmus, seizure.    Will obtain MSK [place] of feet and ankle to look for any synovitis.    She is following with ophtho for uveitis-recent exam reportedly normal with Dr. Margot Chimes I managing her glaucoma.    She will eventually need baseline pulmonary evaluation and PFTs-prolonged recovery from pneumonia, rule out obstructive disease.        1. Behcet's disease  - CBC with Differential (Order); Future  - Comprehensive Metabolic Panel; Future  - Sedimentation Rate (ESR); Future  - C  Reactive Protein; Future    2. Foot pain, bilateral  - Ultrasound foot right MSK; Future  - Ultrasound ankle right MSK; Future  - Ultrasound ankle left MSK; Future  - Ultrasound foot left MSK; Future  - CBC with Differential (Order); Future  - Comprehensive Metabolic Panel; Future  - Sedimentation Rate (ESR); Future  - C Reactive  Protein; Future    3. Chronic pain of both ankles  - Ultrasound foot right MSK; Future  - Ultrasound ankle right MSK; Future  - Ultrasound ankle left MSK; Future  - Ultrasound foot left MSK; Future  - CBC with Differential (Order); Future  - Comprehensive Metabolic Panel; Future  - Sedimentation Rate (ESR); Future  - C Reactive Protein; Future    4. Fatty liver disease, nonalcoholic  - CBC with Differential (Order); Future  - Comprehensive Metabolic Panel; Future  - Sedimentation Rate (ESR); Future  - C Reactive Protein; Future    5. Seronegative rheumatoid arthritis  - CBC with Differential (Order); Future  - Comprehensive Metabolic Panel; Future  - Sedimentation Rate (ESR); Future  - C Reactive Protein; Future    6. High risk medication use  - CBC with Differential (Order); Future  - Comprehensive Metabolic Panel; Future  - Sedimentation Rate (ESR); Future  - C Reactive Protein; Future    7. Uveitis  - CBC with Differential (Order); Future  - Comprehensive Metabolic Panel; Future  - Sedimentation Rate (ESR); Future  - C Reactive Protein; Future                                 Total Time spent reviewing records, interviewing patient and coordinating plan of care: 33 min    FOLLOW UP:  Return in about 8 weeks (around 08/25/2023) for Follow Up, 2 months in person 40 minutes.                                                                                  Debe Coder, MD  Rheumatologist  [facility]  [address]  [place], [place] [address]  Phone: [phone]  Fax: [phone]

## 2023-07-02 ENCOUNTER — Other Ambulatory Visit (INDEPENDENT_AMBULATORY_CARE_PROVIDER_SITE_OTHER): Payer: Self-pay | Admitting: Internal Medicine

## 2023-07-02 DIAGNOSIS — M0600 Rheumatoid arthritis without rheumatoid factor, unspecified site: Secondary | ICD-10-CM

## 2023-07-02 DIAGNOSIS — M352 Behcet's disease: Secondary | ICD-10-CM

## 2023-07-02 DIAGNOSIS — Z79899 Other long term (current) drug therapy: Secondary | ICD-10-CM

## 2023-07-02 DIAGNOSIS — H209 Unspecified iridocyclitis: Secondary | ICD-10-CM

## 2023-07-03 ENCOUNTER — Telehealth (INDEPENDENT_AMBULATORY_CARE_PROVIDER_SITE_OTHER): Payer: BLUE CROSS/BLUE SHIELD | Admitting: Internal Medicine

## 2023-07-06 ENCOUNTER — Encounter: Payer: Self-pay | Admitting: Neurology

## 2023-07-06 ENCOUNTER — Ambulatory Visit: Payer: BLUE CROSS/BLUE SHIELD | Attending: Neurology | Admitting: Neurology

## 2023-07-06 VITALS — BP 141/93 | HR 112 | Resp 18 | Ht 68.0 in | Wt 321.0 lb

## 2023-07-06 DIAGNOSIS — M352 Behcet's disease: Secondary | ICD-10-CM

## 2023-07-06 DIAGNOSIS — D84821 Immunodeficiency due to drugs: Secondary | ICD-10-CM

## 2023-07-06 DIAGNOSIS — R569 Unspecified convulsions: Secondary | ICD-10-CM

## 2023-07-06 DIAGNOSIS — G43709 Chronic migraine without aura, not intractable, without status migrainosus: Secondary | ICD-10-CM

## 2023-07-06 DIAGNOSIS — Z79899 Other long term (current) drug therapy: Secondary | ICD-10-CM

## 2023-07-06 DIAGNOSIS — H532 Diplopia: Secondary | ICD-10-CM

## 2023-07-06 DIAGNOSIS — R413 Other amnesia: Secondary | ICD-10-CM

## 2023-07-06 DIAGNOSIS — G43711 Chronic migraine without aura, intractable, with status migrainosus: Secondary | ICD-10-CM

## 2023-07-06 NOTE — Progress Notes (Signed)
[facility]  MULTIPLE SCLEROSIS & NEUROIMMUNOLOGY CENTER  Main line / Scheduling / on-call: [phone]  Clinic Tel: [phone]  Fax: [phone]  Send [place] a mychart or inbasket message for the fastest response  ~~~~~~~~~~~~~~~~~~~~~~~~~~~~~~~~~~~~~~~~~~~~~~~~~~~~~~~~~~~~~~~~~~  Visit Date: 07/06/2023    Primary (referring) neurologist:     CC:  CNS Bechet's    HPI:  History obtained from records review and patient,     42 y.o. year-old female with diagnosis as per CC above.    Still with daily headaches but intensity decreased    Seizure are once a week - unresponsive while watching TV. Roving eye mvts. Confused for a few minutes afterwards    Lightheadedness persists    Cognition remains poor overall; not clearly worsening. +word finding difficulty. Difficulty w/ grocery lists.     Worse mood.     EXAM  Visit Vitals  BP (!) 141/93 (BP Site: Left arm, Patient Position: Sitting, Cuff Size: X-Large)   Pulse (!) 112   Resp 18   Ht 1.727 m (5\' 8" )   Wt (!) 145.6 kg (321 lb)   LMP  (LMP Unknown)   SpO2 97%   BMI 48.81 kg/m     General:   Optic Nerves:   Psychiatric.   Mental Status: The patient was awake, alert, appropriate. Speech with reduced fluency.   Cranial Nerves: CN II-XII with far left gaze dysconjugate gaze.  Motor: 5/5  Reflex:   Sensation: no sensory ataxia  Coordination:   Gait: normal. Tandems well.      Imaging  My review:    Reports:  MRI Brain W WO Contrast    Result Date: 03/07/2023  Impression:   No intracranial mass, acute hemorrhage, acute infarction, or evidence of hydrocephalus. Normal MRI of the brain. Electronically signed by: Brenton Grills M.D. [facility] MK: 03/07/23    MRI Brain W WO Contrast    Result Date: 01/28/2023  Impression:  No acute intracranial or orbital abnormality. Milan Waunita Schooner, MD 01/28/2023 7:57 AM    CT Head without Contrast    Result Date: 01/27/2023  Impression:   1.No intracranial abnormality is seen. 2. Given the history and  symptoms, follow-up MRI may be of benefit. Theodoro Doing, MD 01/27/2023 2:29 PM    MRI Lumbar Spine WO Contrast    Result Date: 12/19/2022  Impression:  1.  No acute osseous or soft tissue abnormality. 2.  Normal MR appearance of the distal spinal cord, conus medullaris and cauda equina. 3.  Rotatory lumbar levoscoliosis. 4.  Lumbar spondylosis and facet arthrosis without central stenosis or nerve root compression. Please see full details and segmental analysis above. Electronically signed by: Waynard Edwards M.D. [facility] CM: 12/19/22        Testing       Labs. Reviewed in epic. See below  Lab Results   Component Value Date    HGBA1C 5.4 01/28/2023    EGFR >60.0 06/26/2023    CREAT 0.8 06/26/2023    WBC 11.49 (H) 06/26/2023    LYMPHOABS 2.5 07/25/2022    IGG 950 02/20/2023    IGA 216 01/25/2021    IGM 198 01/25/2021    B12 297 01/27/2023    VITD 28 (L) 01/23/2023       ASSESSMENT / PLAN  RTC: Return in about 2 months (around 09/05/2023) for [place].    1. Behcet's disease w/ CNS involvement    2. Seizure-like activity    3. Intractable  chronic migraine without aura and with status migrainosus    4. Chronic migraine without aura without status migrainosus, not intractable    5. Immunocompromised state due to drug therapy    6. Binocular vision disorder with diplopia    7. Memory loss        41y/o F w/ CNS Behcet's. Labs and exam are OK    She struggles cognitively. Fatigue. Word finding difficulty.  Memory remains poor & spotty. Needs assistance w/ daily activities. Will get long term EEG and neurocognitive evaluation.     Remicade 5mg /kg  Otezla  MTX 5mg  weekly  Prednisone 10mg  qam  Lacosamide 200mg  bid  VPA 1gm BID  Vit B12 daily    Recommend EMU for ? Persistent seizures (or ambulatory EEG)  Neuropsych testing w/ Dr Lynnea Ferrier    Orders today (details below)  Orders Placed This Encounter   Procedures    Referral to Neuropsychology    Referral to Speech [facility] HOD     SLP eval and treat    24 HR to 48 HR EEG without Video (LTME)     No orders of the defined types were placed in this encounter.       I spent 40 minutes reviewing the records, talking with the patient and developing their care plan. This excludes time for conduct of separately billed procedures      Lora Havens, MD PHD  Director, Neuroimmunology & MS Center  [facility]  [address], [address], [place], [place] [address]    Main line / scheduling: [phone]    After-hours Neurologist On Call: [phone]    For direct access, contact our MS Nurse, Allayne Stack RN  The fastest response is via mychart  Tel [phone]  Fax [phone]     ================================================================  NFL (neurofilament light chain) Insurance Justification  Serum / plasma Neurofilament light chain is a well-established biomarker for monitoring MS, and regular testing has been incorporated into the Consortium of MS Centers [facility]) guidelines for the monitoring of MS. https://mscare.http://www.stevens.com/     PATIENT INSTRUCTIONS PROVIDED  Patient was given an "After Visit Summary" with a copy of the testing orders, medications and the following other instructions:  Patient Instructions   [facility]ULTIPLE SCLEROSIS & [facility]UROIMMUNOLOGY CENTER  Main Line / Scheduling / on-call: [phone]  Clinic Tel: [phone]  Fax: [phone]  Send [place] a mychart or inbasket message for the fastest response    WELCOME TO THE NEUROIMMUNOLOGY CLINIC!    Neuropsychologic (memory) testing  Crissie Figures  [facility]  [phone]  [address]  [place], [place] [address]    If he does not take your insurance, please schedule with [place] (Dr Olivia Canter).  [phone]      MULTIPLE SCLEROSIS AND NEUROIMMUNE DISEASE UPDATE  (Updated 11/30/2022)    1) Clinic Procedures  We only do  in-person visits   For over 2000 years, medicine has been a face to face, personal enterprise -- and the best medicine happens face to face. Although telemedicine is very convenient (and was necessary during the pandemic), I've seen poor outcomes with telemedicine care. In-person evaluations are critical part of you doing well. As such we do not offer video visits.     Regular, ongoing, follow up visits is also important. Each person is different, but we see the average patient every 3-4  months. We may  not refill controlled substances if you haven't been seen within 6 months, and we may not refill other medications if you haven't been seen within 12 months.    Reaching [place]  - Mychart is best. We will respond within a couple of days. (Regular e-mail is illegal)  - My nurses direct phone [phone]. The main neurology line is [phone]. Leave a message and we will get back to you in a few days.  - After hours, the main line [phone]) will page the on-call neurologist.    You will probably hear back from my nurses. I communicate with them throughout the day. The advice they give you is coming from me - please take it seriously.    Emergency / After Hours Care.   If you have questions after hours, call the main number [phone]) and you will reach the on-call neurologist who can help you.    If you have severe, acute or debilitating symptoms, go to the emergency room. But also let us know via mychart or a phone call. I can best help you if you are at an [facility] hospital. You might not have a choice where the ambulance takes you; if you are not at an [facility] hospital, I'll try to help you the best I can. But I probably can't see everything that's going on.    Urgent appointments are available within a few days. My nurses have access to more appointment slots than the call center does.     Testing. Details matter.  The tests I order are specific and high-quality MS care requires advanced technology    -  MRI's: we only work with [facility] radiology or [facility]. Some of our patients are enrolled in studies at [facility], and that works as well. If you have had outside MRI's done previously, please bring the CD's so that we can upload them.     - LABS: Go to the lab indicated on the top of your lab order. Do not take an [facility] lab slip to [facility]- they will do it incorrectly. Do not go to [facility] or your primary care doctor. Most patients are sent to [facility] lab. If your insurance absolutely doesn't cover [place] will be sent to [facility].    - Results of testing will be communicated once they all return at follow-up visits. Many of the tests are nuanced and must be interpreted within the overall clinical picture.     2) Doing well with Neuroimmune Disease  Symptoms don't tell you how the disease is doing.  You can experience more symptoms even if the disease isn't worsening. Environmental factors like infections, extremes of heat or cold, stress (emotional or physiologic), fatigue, infections, other medical diseases, and certain foods may make you feel more symptoms. That's not the disease worsening, it's not more neurologic damage, and it's not a relapse. Those are worsening symptoms. Most neuroimmune patients have good days & bad days. You might need symptomatic medications to help you function well.    Also, some diseases worsen without producing more symptoms.     In general, if you have a question about your symptoms or notice any new symptoms, please reach out.      Monitoring the Disease and Medication Response:  MS and neuroimmune diseases are caused by your body's immune system attacking the nervous system (brain, spinal cord, nerves or muscles). We use immune treatments to prevent more damage. We can measure whether those treatments are working through objective testing -  labs, MRI's, EMGs, etc.    For MS, NMO or MOG, we know that everyone develop more lesions, neurologic problems on exam and symptoms unless they are  treated (Dr Loura Back in [place] proved this). The MRI is one of the best ways of detecting more damage - it shows up as a new lesion. The clinical exam and labs can also help [place]. In MS and MOG, a relapse without symptoms is very common, so we do frequent labs and yearly MRI's to monitor the disease.     For other neuroimmune diseases -- eg,  myasthenia, CIDP/GBS, or rheumatologic diseases -- not everyone requires treatment. But we use labs, exam, MRI's and/or EMG to monitor the diseases    Medications are the start, but they aren't everything. They won't repair damage that already happened, and they may not help symptoms.     Physical and Cognitive Activity Promote Neurologic Recovery: In order to improve the disease, the brain has to rewire around the damaged nerves. The best way to do this is by being active - physically and mentally. Physical, occupational or cognitive therapy can teach you exercises to address specific problems. Physical activity has been shown to improve energy, mood, and reduce pain. Be as physically and mentally active as you can; learn to pace yourself.    Diet: There is no such thing as an "MS diet" or "anti-inflammatory diet". Foods don't affect MRI lesions or prevent neurologic damage, but a good or bad diet can markedly impact symptoms. My suggestion is to eat healthy - we all know what that is! Diets like the Wahl's protocol or Swank diet have ideas worth trying, but don't feel obligated to follow them precisely. See if adding fiber, reducing gluten, reducing dairy or eliminating processed foods makes a difference in how you feel.,    Vitamins and Supplements  Vitamins can affect neurologic functioning:  - Vitaimn D and MS. High vit D levels correlate with better outcomes in MS patients. Target a total (25-OH) Vit D level between 50-100. Vitamin D3 (CHOLEcalciferol; white pill) works better than Vitamin D2 (ERGOcalciferol; green pill). You can get high doses of vitamin D3 from  [facility] (eg, 10,000 or 50,000 units).     - Target vit B12 level above 500. Levels below 400 can cause nerve & spinal cord degeneration. Levels above the normal range are perfectly safe.    - Vitamin B6 (pyridoxine) and zinc toxicity cause neuropathy. Do not take B-complex or zinc. Other sources include multi-vitamins, some energy drinks, and other supplements. Check the back of the bottle, not just the front. Allena Katz CD. "Pyridoxine toxicity courtesy of your local health food store" Ann Rheum Dis. 2006 Dec;65(12):1666-7]    - marked copper deficiency also causes nerve & spinal cord degeneration    - the data around other supplements like biotin, alpha-lipoic acid, etc is unclear, so I generally don't recommend them    Support Groups  MS Group - [place] (via Zoom) Astrid Divine, for MS, NMO, MOG. Her cell is [phone].  MS Group - Benna Dunks [phone]    National MS Society [facility]): MalpracticeBoard.com.au  Myasthenia Gravis Foundation of [place] (MGFA): MoralGame.si  [employer] for NMO: https://www.sumairafoundation.org/  [employer] for NMO: https://guthyjacksonfoundation.org/  Foundation for Sarcoid Research  DiscFull.com.cy    3) Neurologic Disease, Immune Suppressants and Infections  - MS, NMO, MOG, and GBS/CIDP themselves do not increase your risk of infection (including COVID). Severe myasthenia might increase your risk for  lung infections    - Call us if you develop a fever more than 101 F, have a productive cough longer than 2 weeks, or if you have recurring or difficult-to-treat infections. Immune suppressants can increase infection risk. You might need antibiotics, or stronger antibiotics than other people your age.    - None of the MS medications increase your COVID risk above that of an untreated patient (COVIMS study)    - Infections can cause more symptoms. That's usually not  disease worsening or neurologic damage from infection. That is symptom worsening, similar to what happens with stress or the heat. You will get better a few days after the infection improves.    - Rarely, COVID can cause neurologic complications. Early on, with severe COVID, we saw a lot of strokes and seizures. We see less of that now. But, we are seeing the emergence of a post-COVID neurologic syndrome that looks like Guillain-Barre syndrome or transverse myelitis. The way to test for this is with a spinal tap (lumbar puncture). Neuro-COVID is treatable.    4) Vaccines  Safe Vaccines, regardless of immunosuppressant  - Pfizer, Conservation officer, nature COVID vaccines are safe in MS and with all immunosuppressants. (There were issues with J&J and Astra-Zeneca).    - Flu vaccine. Use Flublok brand only (recombinant hemagluttin quadrivalent). This is per FDA guidance for all neurologic patients. It can be difficult to find and we can give you an RX for it.     - Shingles vaccine. Use Shingrix (RZV, recombinant zoster) vaccine. This is per FDA guidance for all neurologic patients.     - All pneumonia vaccines are safe (Prevnar & Capvaxive )    - Tetanus toxoid    Unsafe Vaccines (for any neuroimmune patients)  - Live virus or inactivated virus vaccines are contraindicated in neuro-immunologic patients on any immunotherapy.    - monkeypox vaccines (JYNNEOS and ACAM2000) are contraindicated in MS and patients on immunosupressants.     - RSV vaccines are associated with cases of Guillian Barre syndrome, so I would avoid them.     - J&J and Astra Zeneca COVID vaccine were associated with transverse myelitis    Notes:  - Mavenclad: The contraindication and timing requirements do not apply to most vaccines. They are only for live virus vaccines, which are contraindicated anwyays    - You don't need to time your vaccinations with infusion dates. There have been theoretical concerns about immunosuppression affecting vaccine  effectiveness, but recent research has shown this not to be true.    - MS is compatible with pregnancy but we need to know if you plan on getting pregnant or become pregnant as we may need to adjust your treatment. Certain disease modifying treatments for MS are better than others for those seeking to become pregnant.       Thank you,      Lora Havens, MD PHD  Director, Neuroimmunology & MS Center  [facility]  [address], [address], [place], [place] [address]    Main line / scheduling: [phone]    After-hours Neurologist On Call: [phone]    For direct access, contact our MS Nurse, Allayne Stack RN  The fastest response is via mychart  Tel [phone]  Fax [phone]     IMPORTED INFORMATION FROM MEDICAL RECORDS   Diagnosis ICD-10-CM Associated Order   1. Behcet's disease w/ CNS involvement  M35.2 24 HR to  48 HR EEG without Video (LTME)     Referral to Neuropsychology     SLP eval and treat      2. Seizure-like activity  R56.9 24 HR to 48 HR EEG without Video (LTME)     Referral to Neuropsychology     SLP eval and treat      3. Intractable chronic migraine without aura and with status migrainosus  G43.711       4. Chronic migraine without aura without status migrainosus, not intractable  G43.709       5. Immunocompromised state due to drug therapy  D84.821     Z79.899       6. Binocular vision disorder with diplopia  H53.2       7. Memory loss  R41.3 Referral to [facility] HOD        Orders Placed This Encounter   Procedures    Referral to Neuropsychology     Standing Status:   Future     Standing Expiration Date:   07/05/2024     Referral Priority:   Routine     Referral Type:   Consultation     Referral Reason:   Services Requested     Number of Visits Requested:   1    Referral to Speech [facility] HOD     Standing Status:   Future     Standing Expiration Date:   07/05/2024     Referral Priority:    Routine     Referral Type:   Consultation     Referral Reason:   Services Requested     Requested Specialty:   Speech Language Pathology     Number of Visits Requested:   1    SLP eval and treat     Standing Status:   Future     Standing Expiration Date:   07/05/2024    24 HR to 48 HR EEG without Video (LTME)     Standing Status:   Future     Standing Expiration Date:   07/05/2024     Order Specific Question:   Reason for Exam     Answer:   monitor, on meds

## 2023-07-06 NOTE — Patient Instructions (Signed)
[facility]  Main Line / Scheduling / on-call: [phone]  Clinic Tel: [phone]  Fax: [phone]  Send [place] a mychart or inbasket message for the fastest response    WELCOME TO THE NEUROIMMUNOLOGY CLINIC!    Neuropsychologic (memory) testing  Crissie Figures  [facility]  [phone]  [address], [address]  [place], [place] [address]    If he does not take your insurance, please schedule with [facility] (Dr Olivia Canter).  [phone]      MULTIPLE SCLEROSIS AND NEUROIMMUNE DISEASE UPDATE  (Updated 11/30/2022)    1) Clinic Procedures  We only do in-person visits   For over 2000 years, medicine has been a face to face, personal enterprise -- and the best medicine happens face to face. Although telemedicine is very convenient (and was necessary during the pandemic), I've seen poor outcomes with telemedicine care. In-person evaluations are critical part of you doing well. As such we do not offer video visits.     Regular, ongoing, follow up visits is also important. Each person is different, but we see the average patient every 3-4  months. We may not refill controlled substances if you haven't been seen within 6 months, and we may not refill other medications if you haven't been seen within 12 months.    Reaching [place]  - Mychart is best. We will respond within a couple of days. (Regular e-mail is illegal)  - My nurses direct phone [phone]. The main neurology line is [phone]. Leave a message and we will get back to you in a few days.  - After hours, the main line [phone]) will page the on-call neurologist.    You will probably hear back from my nurses. I communicate with them throughout the day. The advice they give you is coming from me - please take it seriously.    Emergency / After Hours Care.   If you have questions after hours, call the main number [phone]) and you will reach the on-call neurologist who can help  you.    If you have severe, acute or debilitating symptoms, go to the emergency room. But also let us know via mychart or a phone call. I can best help you if you are at an [facility] hospital. You might not have a choice where the ambulance takes you; if you are not at an [facility], I'll try to help you the best I can. But I probably can't see everything that's going on.    Urgent appointments are available within a few days. My nurses have access to more appointment slots than the call center does.     Testing. Details matter.  The tests I order are specific and high-quality MS care requires advanced technology    - MRI's: we only work with [facility] radiology or [facility]. Some of our patients are enrolled in studies at [facility], and that works as well. If you have had outside MRI's done previously, please bring the CD's so that we can upload them.     - LABS: Go to the lab indicated on the top of your lab order. Do not take an [place] lab slip to [facility]- they will do it incorrectly. Do not go to [facility] or your primary care doctor. Most patients are sent to [facility] lab. If your insurance absolutely doesn't cover [place] will be sent to [facility].    - Results of testing will be communicated once they  all return at follow-up visits. Many of the tests are nuanced and must be interpreted within the overall clinical picture.     2) Doing well with Neuroimmune Disease  Symptoms don't tell you how the disease is doing.  You can experience more symptoms even if the disease isn't worsening. Environmental factors like infections, extremes of heat or cold, stress (emotional or physiologic), fatigue, infections, other medical diseases, and certain foods may make you feel more symptoms. That's not the disease worsening, it's not more neurologic damage, and it's not a relapse. Those are worsening symptoms. Most neuroimmune patients have good days & bad days. You might need symptomatic medications to help you function well.    Also, some  diseases worsen without producing more symptoms.     In general, if you have a question about your symptoms or notice any new symptoms, please reach out.      Monitoring the Disease and Medication Response:  MS and neuroimmune diseases are caused by your body's immune system attacking the nervous system (brain, spinal cord, nerves or muscles). We use immune treatments to prevent more damage. We can measure whether those treatments are working through objective testing - labs, MRI's, EMGs, etc.    For MS, NMO or MOG, we know that everyone develop more lesions, neurologic problems on exam and symptoms unless they are treated (Dr Loura Back in [place] proved this). The MRI is one of the best ways of detecting more damage - it shows up as a new lesion. The clinical exam and labs can also help [place]. In MS and MOG, a relapse without symptoms is very common, so we do frequent labs and yearly MRI's to monitor the disease.     For other neuroimmune diseases -- eg,  myasthenia, CIDP/GBS, or rheumatologic diseases -- not everyone requires treatment. But we use labs, exam, MRI's and/or EMG to monitor the diseases    Medications are the start, but they aren't everything. They won't repair damage that already happened, and they may not help symptoms.     Physical and Cognitive Activity Promote Neurologic Recovery: In order to improve the disease, the brain has to rewire around the damaged nerves. The best way to do this is by being active - physically and mentally. Physical, occupational or cognitive therapy can teach you exercises to address specific problems. Physical activity has been shown to improve energy, mood, and reduce pain. Be as physically and mentally active as you can; learn to pace yourself.    Diet: There is no such thing as an "MS diet" or "anti-inflammatory diet". Foods don't affect MRI lesions or prevent neurologic damage, but a good or bad diet can markedly impact symptoms. My suggestion is to eat healthy - we  all know what that is! Diets like the Wahl's protocol or Swank diet have ideas worth trying, but don't feel obligated to follow them precisely. See if adding fiber, reducing gluten, reducing dairy or eliminating processed foods makes a difference in how you feel.,    Vitamins and Supplements  Vitamins can affect neurologic functioning:  - Vitaimn D and MS. High vit D levels correlate with better outcomes in MS patients. Target a total (25-OH) Vit D level between 50-100. Vitamin D3 (CHOLEcalciferol; white pill) works better than Vitamin D2 (ERGOcalciferol; green pill). You can get high doses of vitamin D3 from [facility] (eg, 10,000 or 50,000 units).     - Target vit B12 level above 500. Levels below 400 can cause nerve & spinal  cord degeneration. Levels above the normal range are perfectly safe.    - Vitamin B6 (pyridoxine) and zinc toxicity cause neuropathy. Do not take B-complex or zinc. Other sources include multi-vitamins, some energy drinks, and other supplements. Check the back of the bottle, not just the front. Allena Katz CD. "Pyridoxine toxicity courtesy of your local health food store" Ann Rheum Dis. 2006 Dec;65(12):1666-7]    - marked copper deficiency also causes nerve & spinal cord degeneration    - the data around other supplements like biotin, alpha-lipoic acid, etc is unclear, so I generally don't recommend them    Support Groups  MS Group - [place] (via Zoom) Astrid Divine, for MS, NMO, MOG. Her cell is [phone].  MS Group - Benna Dunks [phone]    National MS Society [facility]): MalpracticeBoard.com.au  Myasthenia Gravis Foundation of [place] (MGFA): MoralGame.si  [employer] for NMO: https://www.sumairafoundation.org/  [employer] for NMO: https://guthyjacksonfoundation.org/  Foundation for Sarcoid Research  DiscFull.com.cy    3) Neurologic Disease, Immune Suppressants and  Infections  - MS, NMO, MOG, and GBS/CIDP themselves do not increase your risk of infection (including COVID). Severe myasthenia might increase your risk for lung infections    - Call us if you develop a fever more than 101 F, have a productive cough longer than 2 weeks, or if you have recurring or difficult-to-treat infections. Immune suppressants can increase infection risk. You might need antibiotics, or stronger antibiotics than other people your age.    - None of the MS medications increase your COVID risk above that of an untreated patient (COVIMS study)    - Infections can cause more symptoms. That's usually not disease worsening or neurologic damage from infection. That is symptom worsening, similar to what happens with stress or the heat. You will get better a few days after the infection improves.    - Rarely, COVID can cause neurologic complications. Early on, with severe COVID, we saw a lot of strokes and seizures. We see less of that now. But, we are seeing the emergence of a post-COVID neurologic syndrome that looks like Guillain-Barre syndrome or transverse myelitis. The way to test for this is with a spinal tap (lumbar puncture). Neuro-COVID is treatable.    4) Vaccines  Safe Vaccines, regardless of immunosuppressant  - Pfizer, Conservation officer, nature COVID vaccines are safe in MS and with all immunosuppressants. (There were issues with J&J and Astra-Zeneca).    - Flu vaccine. Use Flublok brand only (recombinant hemagluttin quadrivalent). This is per FDA guidance for all neurologic patients. It can be difficult to find and we can give you an RX for it.     - Shingles vaccine. Use Shingrix (RZV, recombinant zoster) vaccine. This is per FDA guidance for all neurologic patients.     - All pneumonia vaccines are safe (Prevnar & Capvaxive )    - Tetanus toxoid    Unsafe Vaccines (for any neuroimmune patients)  - Live virus or inactivated virus vaccines are contraindicated in neuro-immunologic patients on any  immunotherapy.    - monkeypox vaccines (JYNNEOS and ACAM2000) are contraindicated in MS and patients on immunosupressants.     - RSV vaccines are associated with cases of Guillian Barre syndrome, so I would avoid them.     - J&J and Astra Zeneca COVID vaccine were associated with transverse myelitis    Notes:  - [employer]cl[employer]: The contraindication and timing requirements do not apply to most vaccines. They are only for live virus vaccines,  which are contraindicated anwyays    - You don't need to time your vaccinations with infusion dates. There have been theoretical concerns about immunosuppression affecting vaccine effectiveness, but recent research has shown this not to be true.    - MS is compatible with pregnancy but we need to know if you plan on getting pregnant or become pregnant as we may need to adjust your treatment. Certain disease modifying treatments for MS are better than others for those seeking to become pregnant.       Thank you,      Lora Havens, MD PHD  Director, Neuroimmunology & MS Center  [facility]  [address], [place], [place] [address]    Main line / scheduling: [phone]    After-hours Neurologist On Call: [phone]    For direct access, contact our MS Nurse, Allayne Stack RN  The fastest response is via [place]  Tel [phone]  Fax [phone]

## 2023-07-09 ENCOUNTER — Other Ambulatory Visit (INDEPENDENT_AMBULATORY_CARE_PROVIDER_SITE_OTHER): Payer: Self-pay | Admitting: Residents

## 2023-07-09 NOTE — Telephone Encounter (Signed)
Rx refill(s) sent.

## 2023-07-17 ENCOUNTER — Encounter: Payer: Self-pay | Admitting: Internal Medicine

## 2023-07-17 DIAGNOSIS — M0600 Rheumatoid arthritis without rheumatoid factor, unspecified site: Secondary | ICD-10-CM

## 2023-07-17 DIAGNOSIS — M352 Behcet's disease: Secondary | ICD-10-CM

## 2023-07-17 DIAGNOSIS — H209 Unspecified iridocyclitis: Secondary | ICD-10-CM

## 2023-07-17 DIAGNOSIS — R569 Unspecified convulsions: Secondary | ICD-10-CM

## 2023-07-17 DIAGNOSIS — Z79899 Other long term (current) drug therapy: Secondary | ICD-10-CM

## 2023-07-19 ENCOUNTER — Telehealth: Payer: Self-pay | Admitting: Neurology

## 2023-07-19 NOTE — Telephone Encounter (Signed)
Hailey [name] from the sleep center is calling to clarify EEG order. The patient is trying to schedule the EEG but there is no duration on the order. Hailey [name] from the sleep center is asking for a call back at [phone].

## 2023-07-20 ENCOUNTER — Other Ambulatory Visit: Payer: Self-pay | Admitting: Nurse Practitioner

## 2023-07-20 DIAGNOSIS — R569 Unspecified convulsions: Secondary | ICD-10-CM

## 2023-07-25 ENCOUNTER — Ambulatory Visit: Payer: BLUE CROSS/BLUE SHIELD

## 2023-07-25 ENCOUNTER — Ambulatory Visit
Admission: RE | Admit: 2023-07-25 | Discharge: 2023-07-25 | Disposition: A | Discharge status: Home / Self Care | Payer: BLUE CROSS/BLUE SHIELD | Source: Ambulatory Visit | Attending: Internal Medicine | Admitting: Internal Medicine

## 2023-07-25 ENCOUNTER — Other Ambulatory Visit: Payer: Self-pay

## 2023-07-25 DIAGNOSIS — M79671 Pain in right foot: Secondary | ICD-10-CM | POA: Insufficient documentation

## 2023-07-25 DIAGNOSIS — M79672 Pain in left foot: Secondary | ICD-10-CM | POA: Insufficient documentation

## 2023-07-25 DIAGNOSIS — M25571 Pain in right ankle and joints of right foot: Secondary | ICD-10-CM | POA: Insufficient documentation

## 2023-07-25 DIAGNOSIS — M25572 Pain in left ankle and joints of left foot: Secondary | ICD-10-CM | POA: Insufficient documentation

## 2023-07-25 DIAGNOSIS — G8929 Other chronic pain: Secondary | ICD-10-CM | POA: Insufficient documentation

## 2023-08-02 ENCOUNTER — Telehealth: Payer: Self-pay

## 2023-08-08 ENCOUNTER — Telehealth (INDEPENDENT_AMBULATORY_CARE_PROVIDER_SITE_OTHER): Payer: BLUE CROSS/BLUE SHIELD | Admitting: Internal Medicine

## 2023-08-11 ENCOUNTER — Encounter: Payer: Self-pay | Admitting: Internal Medicine

## 2023-08-16 ENCOUNTER — Encounter: Payer: Self-pay | Admitting: No Specialty

## 2023-08-16 ENCOUNTER — Ambulatory Visit: Payer: BLUE CROSS/BLUE SHIELD | Attending: Psychiatry | Admitting: No Specialty

## 2023-08-16 VITALS — BP 135/84 | HR 78 | Ht 68.0 in

## 2023-08-16 DIAGNOSIS — G43709 Chronic migraine without aura, not intractable, without status migrainosus: Secondary | ICD-10-CM

## 2023-08-16 DIAGNOSIS — S0300XD Dislocation of jaw, unspecified side, subsequent encounter: Secondary | ICD-10-CM

## 2023-08-16 MED ORDER — ONABOTULINUMTOXINA IJ SOLR (WRAP)
175.0000 [IU] | INTRAMUSCULAR | Status: AC
Start: 2023-08-16 — End: ?
  Administered 2023-08-16 – 2024-12-09 (×4): 175 [IU] via INTRAMUSCULAR

## 2023-08-16 NOTE — Patient Instructions (Addendum)
Our plan:     Continue current medictaions        Today's Visit:      In today's visit, I reviewed your medications and records relating your health - prior testing, blood work, reports of other health care providers present in your electronic medical record.     If you have pertinent records from any non-[facility] doctors that you would like to review, please have them sent to us or bring to them to your next office visit.     A copy of today's visit will be sent to your referring doctor and/or primary care doctor, if you have one listed in our system.    Let me know if there are things we could have done better for your office visit.    Patient satisfaction survey:      If you receive a patient satisfaction survey, I would greatly appreciate it if you would complete it. We value your feedback.     Contact me online:      Patient Portal online - Please sign up for MyChart -- this is the best way to communicate with your team here.  There is a messaging feature, where you can us messages anytime of day.  It is the best way to communicate with us and get test results, medication refills, or ask questions.     You can expect to get a response within 24-48 hours during weekdays.  If you do not receive a timely response, resend your request and inform us. My goal is for every question answered ever day.  Average response for a phone call maybe 3 days due to the volume we receive (which is why MyChart is preferred).    If you are having a medical emergency -- call 911, DO NOT SEND A MESSAGE THROUGH MYCHART.     Coupons for medication:      If you have any trouble affording your medications, check out www.goodrx.com for coupons and competitive prices in your area.  If you need further assistance, let us know so we can work with you and your insurance to make sure you get the best care.    Thank you for trusting me with your health.      Take care,  Mouhamad Teed, MD  [facility]   [facility]: [phone]  [place]: [phone]

## 2023-08-16 NOTE — Progress Notes (Signed)
Botox 200 units witness by DR.Kastl Botox 200 units mixed with 2 ml N/S  draw in each  1 ml syringe and 30 G. Needle order read back and verified to DR.Kastl.

## 2023-08-16 NOTE — Progress Notes (Addendum)
08/16/2023      CC: migraine    History was obtained from patient   History of Present Illness:  Hailey [name] is a 42 y.o. 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     Oct 2024:   She presents for Botox injections    She is using atogepant 60mg , Nurtec and eletrtipan for her migraine management.     She sees Dr. Theodoro Grist and has a 48 hours VEEG scheduled.      Jan 2024:   She presents for Botox injections. Headaches remain wel controlled. She reports only 2 breakthrough headache days in January.       Notes reviewed: Admission for viral illness in Jan 2024  Last injections Sep 12, 2022.     Mar 15 2022  Headache hx: since college  Location:bilateral and band like and left eye  Described as sharp and throbbing  Occurs:>15 HA days per month  Duration:>4 hours  Intensity: moderate to severe  Aura:none  Associated with photo/phonophobia, nausea and dizziness  Denies focal motor/sensory or visual changes.   No positional or autonomic symptoms  Exacerbating factors:unknown  Alleviating factors:unknown  Sleep:OK  Mood:high, working in coping mechanisms    Past medications tried:  Wellsite geologist  Rizatriptan  imitrex  buspar,   Topiramate: SE  reglan,   Propranolol ineffective  hydroxyzine        Past Medical History:  Past Medical History:   Diagnosis Date    Abnormal vision     reading glasses    Anxiety     Arrhythmia     WPW ablation in 2005, 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 Released from cardiology    Behcet's disease     She sees Dr. 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    02/11/23 stable on medical    Diarrhea     Ear, nose and throat disorder     seasonal allergies    Fatigue     Bechet's    Fever 11/09/2022    Headache     migraine    Hx of ovarian cyst     Lower back pain     not severe    Meningitis 2011    Viral // treated and resolved    Pain in wrist 11/09/2022    Post-operative nausea and vomiting     Scoliosis     moderate// used a Brace at a young age     Uveitis     using eyedrops       Past Surgical History:  Past Surgical History:   Procedure Laterality Date    ABLATION - PVC'S N/A 06/08/2022    Procedure: Ablation - PVC's;  Surgeon: [name], MD;  Location: [facility];  Service: Cardiovascular;  Laterality: N/A;  SAME DAY DISCHARGE  CARTO NO TEE    ARTHROTOMY, WRIST Right 12/03/2020    Procedure: RIGHT UPPER EXTREMITY SYNOVIAL BIOPSY;  Surgeon: Kellie Simmering, MD;  Location: [facility];  Service: Plastics;  Laterality: Right;    BIOPSY, LYMPH NODE N/A 07/23/2018    Procedure: BIOPSY, LYMPH NODE;  Surgeon: Shelda Pal, MD;  Location: [facility];  Service: ENT;  Laterality: N/A;  NECK DEEP LYMPH NODE BIOPSY    BONE MARROW BIOPSY  2020    led to behcet's dx    CARDIAC ABLATION  2005    [facility] for Wolff-Parkinson-[facility]  CHOLECYSTECTOMY  2009    COLONOSCOPY, DIAGNOSTIC (SCREENING) N/A 03/21/2023    Procedure: COLONOSCOPY, DIAGNOSTIC (SCREENING);  Surgeon: Darlyn Chamber, MD;  Location: [facility];  Service: Gastroenterology;  Laterality: N/A;    DILATION AND CURETTAGE OF UTERUS  2010    EGD N/A 03/21/2023    Procedure: EGD;  Surgeon: Darlyn Chamber, MD;  Location: [facility];  Service: Gastroenterology;  Laterality: N/A;    FINGER GANGLION CYST EXCISION  04/2018    GANGLION CYST EXCISION  2014    04/2018 cyst removed from index finger[facility]    HYSTERECTOMY  2012    HYSTEROSCOPY  2011    indication: Abnormal bleeding    LAPAROSCOPIC, LYSIS, ADHESIONS N/A 11/09/2022    Procedure: LAPAROSCOPIC LYSIS OF ADHESIONS;  Surgeon: Devota Pace, MD;  Location: [facility];  Service: Gynecology;  Laterality: N/A;    LAPAROSCOPIC, SALPINGECTOMY Left 11/09/2022    Procedure: LAPAROSCOPIC, SALPINGECTOMY LEFT;  Surgeon: Devota Pace, MD;  Location: [facility];  Service: Gynecology;  Laterality: Left;    LAPAROSCOPY, DIAGNOSTIC N/A 11/09/2022    Procedure: LAPAROSCOPY, DIAGNOSTIC;  Surgeon: Devota Pace, MD;  Location: [place] [facility];  Service: Gynecology;  Laterality: N/A;  [id number]    UMBILICAL HERNIA REPAIR  2016    UPPER GASTROINTESTINAL ENDOSCOPY  2009    UPPER GASTROINTESTINAL ENDOSCOPY  03/2023    WISDOM TOOTH EXTRACTION  1998       Allergies:  Fruit blend flavor [flavoring agent], Latex, Adhesive [wound dressing adhesive], Cyanoacrylate, Other, and Oxycodone    Family History:  Family History   Adopted: Yes     No other family history related to current complaints.    Social History:  Social History     Tobacco Use    Smoking status: Never     Passive exposure: Never    Smokeless tobacco: Never   Vaping Use    Vaping status: Never Used   Substance Use Topics    Alcohol use: Never    Drug use: Never       Medications:  Current Outpatient Medications   Medication Sig Dispense Refill    Atogepant 60 MG Tab Take 1 tablet (60 mg) by mouth nightly For migraine prvention 30 tablet 11    Azelastine-Fluticasone 137-50 MCG/ACT Suspension 1 spray by Nasal route 2 (two) times daily      botulinum toxin type A (Botox) 200 units injection Inject 155 units IM every 12 weeks 1 each 3    busPIRone (BUSPAR) 5 MG tablet Take 1 tablet (5 mg) by mouth 2 (two) times daily 180 tablet 3    clindamycin (CLEOCIN T) 1 % lotion Apply topically every morning      colesevelam (WELCHOL) 625 MG tablet Take 2 tablets (1,250 mg) by mouth every morning 180 tablet 3    Dapsone 7.5 % Gel Apply topically every morning      dicyclomine (BENTYL) 20 MG tablet TAKE 1 TABLET (20 MG) BY MOUTH EVERY 6 (SIX) HOURS 360 tablet 3    divalproex, ER, extended release (DEPAKOTE ER) 500 MG 24 hr tablet Take 2 tablets (1,000 mg) by mouth 2 (two) times daily 360 tablet 3    dorzolamide-timolol (COSOPT) 2-0.5 % ophthalmic solution       eletriptan (RELPAX) 40 MG tablet Take 1 tablet (40 mg) by mouth as needed for Migraine .  Wait at least 2 hours prior to taking second dose.  Max 2 tabs/24 hours. Do not  use more than 3 days per week. 12 tablet 5     EPINEPHrine (EPIPEN 2-PAK) 0.3 MG/0.3ML Solution Auto-injector injection Inject 0.3 mLs (0.3 mg) into the muscle once as needed (Anaphylaxis) 2 each 2    folic acid (FOLVITE) 1 MG tablet TAKE 1 TABLET BY MOUTH EVERY DAY 90 tablet 1    hydrOXYzine (ATARAX) 10 MG tablet Take 1 tablet (10 mg) by mouth nightly 90 tablet 3    inFLIXimab 100 MG injection Infuse into the vein once every eight weeks      Lacosamide (VIMPAT) 200 MG Tab Take 1 tablet (200 mg) by mouth 2 (two) times daily 180 tablet 3    lidocaine-prilocaine (EMLA) cream Apply topically as needed Dermatology procedures      Melatonin-Pyridoxine (MELATIN PO) Take 4 mg by mouth nightly      methotrexate 2.5 MG tablet TAKE 2 TABLETS (5 MG) BY MOUTH ONCE A WEEK 24 tablet 1    Multiple Vitamin (MULTIVITAMIN PO) Take by mouth daily      ondansetron (ZOFRAN-ODT) 4 MG disintegrating tablet Take 1 tablet (4 mg) by mouth every 8 (eight) hours as needed for Nausea 30 tablet 2    Otezla 30 MG Tab TAKE 1 TABLET BY MOUTH 2 TIMES A DAY 180 tablet 1    predniSONE (DELTASONE) 10 MG tablet Take 1 tablet (10 mg) by mouth daily 30 tablet 5    Rimegepant Sulfate 75 MG Tablet Dispersible Take 1 tablet (75 mg) by mouth as needed (migraine) 16 tablet 11    valACYclovir (VALTREX) 1000 MG tablet Take 2 tabs at onset; repeat once in 12 hours.  Total 4 tabs per outbreak (Patient taking differently: as needed Take 2 tabs at onset; repeat once in 12 hours.  Total 4 tabs per outbreak  As needed) 12 tablet 3    vitamin B-12 1000 MCG tablet Take 1 tablet (1,000 mcg) by mouth daily 30 tablet 0    VITAMIN D PO Take 1 tablet by mouth daily Dosage unsure       No current facility-administered medications for this visit.       General Exam:  BP 135/84 (BP Site: Left arm, Patient Position: Sitting, Cuff Size: Large)   Pulse 78   Ht 1.727 m (5\' 8" )   LMP  (LMP Unknown)   SpO2 97%   BMI 48.81 kg/m   Respiratory rate wnl.   Gen:  Well-developed, well-nourished.  No acute distress.  Cooperative  with exam.  HEENT:  NC/AT. No icterus or conjunctival hemorrhage noted.  Neck: Full range of motion.        NEUROLOGICAL EXAM    Mental Status: Awake, alert, oriented. Speech fluent without dysarthria. Follows commands well. Attention, memory, and concentration intact. Fund of knowledge appropriate for education.     Cranial Nerves:   Extraocular movements are full. No nystagmus seen. Facial sensation to light touch appears intact. Face is symmetric. Hearing is intact to conversational speech. Cranial nerves II-XII otherwise appears intact.     Motor: Moves all extremities well    Coordination:  No truncal ataxia.    Gait: Normal and steady.      Investigations:      Imaging:  MRI Brain W WO Contrast    Result Date: 04/30/2021  Impression:  1. No intracranial abnormality is detected. MR imaging of the brain is stable with 08/13/2020. 2. There is no lesion within the spinal cord. 3. Mild cervical, thoracic and lumbar spondylosis as discussed above. Electronically  signed by: Otho Ket D.O.  [Interpreted at: [facility] BG: 04/30/21    MRI Cervical Spine W WO Contrast    Result Date: 04/30/2021  Impression:  1. No intracranial abnormality is detected. MR imaging of the brain is stable with 08/13/2020. 2. There is no lesion within the spinal cord. 3. Mild cervical, thoracic and lumbar spondylosis as discussed above. Electronically signed by: Otho Ket D.O.  [Interpreted at: [facility] BG: 04/30/21    MRI Lumbar Spine W WO Contrast    Result Date: 04/30/2021  Impression:  1. No intracranial abnormality is detected. MR imaging of the brain is stable with 08/13/2020. 2. There is no lesion within the spinal cord. 3. Mild cervical, thoracic and lumbar spondylosis as discussed above. Electronically signed by: Otho Ket D.O.  [Interpreted at: [facility] BG: 04/30/21    MRI Thoracic Spine W WO Contrast    Result Date:  04/30/2021  Impression:  1. No intracranial abnormality is detected. MR imaging of the brain is stable with 08/13/2020. 2. There is no lesion within the spinal cord. 3. Mild cervical, thoracic and lumbar spondylosis as discussed above. Electronically signed by: Otho Ket D.O.  [Interpreted at: [facility] BG: 04/30/21          Procedure: Chemodeneveration.   Indication: Chronic migraine     Discussed Botox procedure, answered questions, consent is in the chart.    One  vial of 200 units of Botox was diluted,  with 4  mL of 0.9% NS. Using a 30 gauge half-inch needle, the following muscles were injected with the following doses:   Bilateral corrugator: 10 units divided in 2 sites  Procerus: 5 units in 1 site  Frontalis: 20 units divided in 4 sites  Temporalis: 40 units divided in 8 sites   Occipitalis: 30 units divided in 6 sites  Cervical paraspinals: 20 units divided in 4 sites  Trapezius: 30 units divided in 6 sites   Masseter 20 units divided in 2 sites  A total of 175 units of botulinum toxin A was administered, divided in 31 sites.   There was 45 units waste.   The patient tolerated procedure well and No other adverse effects immediately noted.  LOT [id number]  Exp 10/2025    Fraser Din, MD  [facility]        Recommendations:  1. Chronic migraine without aura without status migrainosus, not intractable  - onabotulinumtoxin A (BOTOX) injection 175 Units    2. Dislocation of temporomandibular joint, subsequent encounter        Hailey [name] is a 42 y.o. 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     Tolerated Botox well today     Plan:   Repeat Botox in 12 weeks    Continue Nurtec 75mg , Atogepant and Eletriptan        Referring (see communications section)      The patient should seek emergent care if there is any change in the symptoms. Proper use and all side effects of medications  discussed    Discussed the importance of sleep hygiene, maintaining appropriate hydration, avoid overuse of caffeine and OTC medications for headache lifestyle modification      Please contact me with any questions. Patients and [facility] Providers can reach me via MyChart.    Fraser Din, MD  [facility]  [place]: [phone]  [place]: [phone]) [phone]    *DISCLAIMER: This note was generated by the Epic EMR system/ Dragon speech recognition and may contain inherent errors or omissions not intended by the user. Grammatical errors, random word insertions, deletions, pronoun errors and incomplete sentences are occasional consequences of this technology due to software limitations. Not all errors are caught or corrected. If there are questions or concerns about the content of this note or information contained within the body of this dictation they should be addressed directly with the author for clarification.*

## 2023-08-18 ENCOUNTER — Telehealth (INDEPENDENT_AMBULATORY_CARE_PROVIDER_SITE_OTHER): Payer: Self-pay

## 2023-08-18 NOTE — Telephone Encounter (Signed)
Error

## 2023-08-22 ENCOUNTER — Other Ambulatory Visit (INDEPENDENT_AMBULATORY_CARE_PROVIDER_SITE_OTHER): Payer: BLUE CROSS/BLUE SHIELD

## 2023-08-22 ENCOUNTER — Ambulatory Visit: Payer: BLUE CROSS/BLUE SHIELD | Attending: Anatomic Pathology & Clinical Pathology

## 2023-08-22 ENCOUNTER — Encounter: Payer: Self-pay | Admitting: Internal Medicine

## 2023-08-22 VITALS — BP 128/85 | HR 80 | Temp 98.1°F | Wt 321.4 lb

## 2023-08-22 DIAGNOSIS — M0600 Rheumatoid arthritis without rheumatoid factor, unspecified site: Secondary | ICD-10-CM

## 2023-08-22 DIAGNOSIS — R569 Unspecified convulsions: Secondary | ICD-10-CM

## 2023-08-22 DIAGNOSIS — Z79899 Other long term (current) drug therapy: Secondary | ICD-10-CM

## 2023-08-22 DIAGNOSIS — M352 Behcet's disease: Secondary | ICD-10-CM

## 2023-08-22 DIAGNOSIS — H209 Unspecified iridocyclitis: Secondary | ICD-10-CM

## 2023-08-22 LAB — COMPREHENSIVE METABOLIC PANEL
ALT: 82 U/L — ABNORMAL HIGH (ref 0–55)
AST (SGOT): 44 U/L — ABNORMAL HIGH (ref 5–41)
Albumin/Globulin Ratio: 1.1 (ref 0.9–2.2)
Albumin: 3.9 g/dL (ref 3.5–5.0)
Alkaline Phosphatase: 44 U/L (ref 37–117)
Anion Gap: 7 (ref 5.0–15.0)
BUN: 15 mg/dL (ref 7–21)
Bilirubin, Total: 0.5 mg/dL (ref 0.2–1.2)
CO2: 27 meq/L (ref 17–29)
Calcium: 10 mg/dL (ref 8.5–10.5)
Chloride: 105 meq/L (ref 99–111)
Creatinine: 0.8 mg/dL (ref 0.4–1.0)
GFR: >60 mL/min/{1.73_m2} (ref >=60.0)
Globulin: 3.5 g/dL (ref 2.0–3.6)
Glucose: 109 mg/dL — ABNORMAL HIGH (ref 70–100)
Hemolysis Index: 11 {index}
Potassium: 3.9 meq/L (ref 3.5–5.3)
Protein, Total: 7.4 g/dL (ref 6.0–8.3)
Sodium: 139 meq/L (ref 135–145)

## 2023-08-22 LAB — SEDIMENTATION RATE: Sed Rate: 2 mm/h (ref <=20)

## 2023-08-22 LAB — C-REACTIVE PROTEIN: C-Reactive Protein: <0.1 mg/dL (ref 0.0–1.1)

## 2023-08-22 MED ORDER — ACETAMINOPHEN 325 MG PO TABS
650.0000 mg | ORAL_TABLET | Freq: Once | ORAL | Status: AC
Start: 2023-08-22 — End: 2023-08-22
  Administered 2023-08-22: 650 mg via ORAL
  Filled 2023-08-22: qty 2

## 2023-08-22 MED ORDER — METHYLPREDNISOLONE SODIUM SUCC 40 MG IJ SOLR (WRAP)
40.0000 mg | Freq: Once | INTRAMUSCULAR | Status: AC
Start: 2023-08-22 — End: 2023-08-22
  Administered 2023-08-22: 40 mg via INTRAVENOUS
  Filled 2023-08-22: qty 1

## 2023-08-22 MED ORDER — DIPHENHYDRAMINE HCL 50 MG/ML IJ SOLN
25.0000 mg | Freq: Once | INTRAMUSCULAR | Status: AC
Start: 2023-08-22 — End: 2023-08-22
  Administered 2023-08-22: 25 mg via INTRAVENOUS
  Filled 2023-08-22: qty 1

## 2023-08-22 MED ORDER — SODIUM CHLORIDE 0.9 % IV SOLN
700.0000 mg | Freq: Once | INTRAVENOUS | Status: AC
Start: 2023-08-22 — End: 2023-08-22
  Administered 2023-08-22: 700 mg via INTRAVENOUS
  Filled 2023-08-22: qty 700

## 2023-08-22 NOTE — Progress Notes (Signed)
Time: 1530    Hailey [name] is a 42 y.o. female here for Infliximab infusion.  Reports ongoing blurred vision, stable dry cough, BLE swelling.  No new or worsening symptoms except more fatigue when "due" for this infusion.    Pre-med :  Tylenol, Benadryl, Solu Medrol    700mg  infused over 120 min without incident.    See MAR, VS, Doc Flowsheets for more details.  Pt tolerated the infusion well without problems.    Time: 1810 , pt discharged stable, NAD.    RTC scheduled 12/9, moving to 12/10 to be compliant w insurance     Marnee Spring, RN

## 2023-08-24 LAB — VALPROIC ACID LEVEL, TOTAL AND FREE
Valproic Acid Free: 9 mg/L (ref 4.8–17.3)
Valproic Acid Level: 86.3 mg/L (ref 50.0–100.0)

## 2023-08-25 ENCOUNTER — Other Ambulatory Visit: Payer: Self-pay | Admitting: Neurology

## 2023-08-28 ENCOUNTER — Ambulatory Visit: Payer: BLUE CROSS/BLUE SHIELD | Admitting: Neurology

## 2023-08-30 ENCOUNTER — Encounter: Payer: Self-pay | Admitting: Neurology

## 2023-09-01 LAB — INFLIXIMAB LEVEL AND ANTI-DRUG ANTIBODY FOR IBD
Infliximab Antibody: 14 [AU] — ABNORMAL HIGH (ref <10)
Infliximab Level: 6.6 ug/mL

## 2023-09-04 ENCOUNTER — Ambulatory Visit: Payer: BLUE CROSS/BLUE SHIELD | Attending: Nurse Practitioner

## 2023-09-04 DIAGNOSIS — R569 Unspecified convulsions: Secondary | ICD-10-CM | POA: Insufficient documentation

## 2023-09-04 NOTE — Progress Notes (Signed)
 As per order,EEG electrodes CT compatible were applied with paste/collodion.The patient was informed of the recording protocol and patient event button.  Patient was given instructional sheet,patient event log and 2 AA batteries to change at the 24 HR recording mark.Batteries were confirmed to be at 100%.

## 2023-09-06 ENCOUNTER — Ambulatory Visit (HOSPITAL_BASED_OUTPATIENT_CLINIC_OR_DEPARTMENT_OTHER): Payer: BLUE CROSS/BLUE SHIELD | Admitting: Clinical Neurophysiology

## 2023-09-06 DIAGNOSIS — R569 Unspecified convulsions: Secondary | ICD-10-CM

## 2023-09-06 NOTE — Progress Notes (Signed)
48 hour Ambulatory EEG removed using Mavidon solution. No skin break down seen.  Patient log sheet scanned into chart.  Formal results to follow.

## 2023-09-08 ENCOUNTER — Encounter (INDEPENDENT_AMBULATORY_CARE_PROVIDER_SITE_OTHER): Payer: Self-pay | Admitting: Internal Medicine

## 2023-09-08 ENCOUNTER — Telehealth (INDEPENDENT_AMBULATORY_CARE_PROVIDER_SITE_OTHER): Payer: BLUE CROSS/BLUE SHIELD | Admitting: Internal Medicine

## 2023-09-08 DIAGNOSIS — H209 Unspecified iridocyclitis: Secondary | ICD-10-CM

## 2023-09-08 DIAGNOSIS — M352 Behcet's disease: Secondary | ICD-10-CM

## 2023-09-08 DIAGNOSIS — M0600 Rheumatoid arthritis without rheumatoid factor, unspecified site: Secondary | ICD-10-CM

## 2023-09-08 DIAGNOSIS — M25572 Pain in left ankle and joints of left foot: Secondary | ICD-10-CM

## 2023-09-08 DIAGNOSIS — R748 Abnormal levels of other serum enzymes: Secondary | ICD-10-CM

## 2023-09-08 DIAGNOSIS — Z79899 Other long term (current) drug therapy: Secondary | ICD-10-CM

## 2023-09-08 NOTE — Progress Notes (Signed)
 RHEUMATOLOGY FOLLOW UP TELEMEDICINE NOTE  Telemedicine Documentation Requirements  Originating site (Patient location): home  Distant site (Provider location): Office  Provider and Title: Dr. Adin Hector  Type of Visit: Video Visit  Verbal Consent has been obt

## 2023-09-10 ENCOUNTER — Encounter: Payer: Self-pay | Admitting: Internal Medicine

## 2023-09-10 DIAGNOSIS — R569 Unspecified convulsions: Secondary | ICD-10-CM

## 2023-09-10 DIAGNOSIS — Z79899 Other long term (current) drug therapy: Secondary | ICD-10-CM

## 2023-09-10 DIAGNOSIS — M0600 Rheumatoid arthritis without rheumatoid factor, unspecified site: Secondary | ICD-10-CM

## 2023-09-10 DIAGNOSIS — H209 Unspecified iridocyclitis: Secondary | ICD-10-CM

## 2023-09-10 DIAGNOSIS — M352 Behcet's disease: Secondary | ICD-10-CM

## 2023-09-11 ENCOUNTER — Telehealth: Payer: Self-pay

## 2023-09-11 NOTE — Telephone Encounter (Signed)
 Pt called and stated that she needed to r/s her 6 month f/u on 10/17/23 appt.  It was scheduled with Dr. Sherryll Burger BUT the pt is established with Dr. Jethro Bolus.  Pt is having an infusion on 10/17/23 and will not be able to make it to the office appt.    Rep tried t

## 2023-09-11 NOTE — Progress Notes (Addendum)
 Patients name: Hailey [name]   Patients date of birth: 1981-04-26       AMBULATORY EEG REPORT    Study State Date: 09/04/23  Study End Date: 09/06/23  Duration: 48 hours    REASON FOR STUDY: Evaluate for seizure    HISTORY: Patient is a 42 y.o. f

## 2023-09-12 ENCOUNTER — Encounter: Payer: Self-pay | Admitting: Neurology

## 2023-09-12 ENCOUNTER — Ambulatory Visit: Payer: BLUE CROSS/BLUE SHIELD | Attending: Neurology | Admitting: Neurology

## 2023-09-12 VITALS — BP 139/92 | HR 76 | Resp 16

## 2023-09-12 DIAGNOSIS — R569 Unspecified convulsions: Secondary | ICD-10-CM

## 2023-09-12 DIAGNOSIS — Z79899 Other long term (current) drug therapy: Secondary | ICD-10-CM

## 2023-09-12 DIAGNOSIS — H209 Unspecified iridocyclitis: Secondary | ICD-10-CM

## 2023-09-12 DIAGNOSIS — G4726 Circadian rhythm sleep disorder, shift work type: Secondary | ICD-10-CM

## 2023-09-12 DIAGNOSIS — G43709 Chronic migraine without aura, not intractable, without status migrainosus: Secondary | ICD-10-CM

## 2023-09-12 DIAGNOSIS — M352 Behcet's disease: Secondary | ICD-10-CM

## 2023-09-12 DIAGNOSIS — R413 Other amnesia: Secondary | ICD-10-CM

## 2023-09-12 DIAGNOSIS — D84821 Immunodeficiency due to drugs: Secondary | ICD-10-CM

## 2023-09-12 MED ORDER — MODAFINIL 200 MG PO TABS
200.0000 mg | ORAL_TABLET | Freq: Every morning | ORAL | 5 refills | Status: DC
Start: 2023-09-12 — End: 2023-12-27

## 2023-09-12 NOTE — Progress Notes (Deleted)
 [facility]  MULT[facility]OIMMUN[facility]Y [facility]  Telephone: [phone]  Fax: [phone]  Send [place] a mychart or inbasket message for the fastest response  ~~~~~~~~~~~~~~~~~~~~~~~~~~~~~~~~~~~~~~~~~~~~~~~~~~~~~~~~~~~~~~~~~~~~~~~~~~~~~~~~~~

## 2023-09-12 NOTE — Addendum Note (Signed)
 Addended by: Vivi Martens on: 09/12/2023 03:46 PM     Modules accepted: Level of Service

## 2023-09-12 NOTE — Progress Notes (Addendum)
 [facility]  [facility]Y [facility]  Telephone: [phone]  Fax: [phone]  Send [place] a mychart or inbasket message for the fastest response  ~~~~~~~~~~~~~~~~~~~~~~~~~~~~~~~~~~~~~~~~~~~~~~~~~~~~~~~~~~~~~~~~~~~~~~~~~~~~~~~~~~

## 2023-09-13 ENCOUNTER — Encounter: Payer: Self-pay | Admitting: Neurology

## 2023-09-14 ENCOUNTER — Telehealth: Payer: Self-pay

## 2023-09-14 NOTE — Telephone Encounter (Signed)
[facility]   Key: [id number]  PA Case ID #: [id number]  Modafinil 200MG  tablets

## 2023-09-25 ENCOUNTER — Encounter: Payer: Self-pay | Admitting: Neurology

## 2023-09-25 ENCOUNTER — Ambulatory Visit (INDEPENDENT_AMBULATORY_CARE_PROVIDER_SITE_OTHER): Payer: BLUE CROSS/BLUE SHIELD | Admitting: Family Medicine

## 2023-09-25 ENCOUNTER — Encounter (INDEPENDENT_AMBULATORY_CARE_PROVIDER_SITE_OTHER): Payer: Self-pay | Admitting: Family Medicine

## 2023-09-25 VITALS — BP 133/92 | HR 87 | Temp 99.3°F

## 2023-09-25 DIAGNOSIS — M352 Behcet's disease: Secondary | ICD-10-CM

## 2023-09-25 DIAGNOSIS — R599 Enlarged lymph nodes, unspecified: Secondary | ICD-10-CM

## 2023-09-25 DIAGNOSIS — H00014 Hordeolum externum left upper eyelid: Secondary | ICD-10-CM

## 2023-09-25 DIAGNOSIS — F419 Anxiety disorder, unspecified: Secondary | ICD-10-CM | POA: Insufficient documentation

## 2023-09-25 DIAGNOSIS — Z79899 Other long term (current) drug therapy: Secondary | ICD-10-CM

## 2023-09-25 DIAGNOSIS — D84821 Immunodeficiency due to drugs: Secondary | ICD-10-CM

## 2023-09-25 MED ORDER — POLYMYXIN B-TRIMETHOPRIM 10000-0.1 UNIT/ML-% OP SOLN
1.0000 [drp] | OPHTHALMIC | 0 refills | Status: AC
Start: 2023-09-25 — End: 2023-10-02

## 2023-09-25 NOTE — Progress Notes (Signed)
 [facility] [place]    Subjective:      Date: 09/25/2023 6:25 PM   Patient ID: Hailey [name] is a 42 y.o. female.    Chief Complaint:  Chief Complaint   Patient presents with    Eye Pain     Stye lower lid pain, 2 weeks. Upper lid pain

## 2023-09-25 NOTE — Progress Notes (Signed)
 Have you seen any specialists/other providers since your last visit with [place]?    Yes, neurology, rheumatology, ophthalmology, OBGYN.       The patient was informed that the following HM items are still outstanding:   There are no preventive care reminders t

## 2023-09-29 NOTE — Telephone Encounter (Signed)
Denial received - resubmitted    [facility]   Key: [id number]  PA Case ID #: [id number]    Modafinil 200MG  tablets

## 2023-10-03 ENCOUNTER — Telehealth (INDEPENDENT_AMBULATORY_CARE_PROVIDER_SITE_OTHER): Payer: BLUE CROSS/BLUE SHIELD | Admitting: Residents

## 2023-10-03 ENCOUNTER — Telehealth (INDEPENDENT_AMBULATORY_CARE_PROVIDER_SITE_OTHER): Payer: Self-pay | Admitting: Residents

## 2023-10-03 ENCOUNTER — Ambulatory Visit (INDEPENDENT_AMBULATORY_CARE_PROVIDER_SITE_OTHER): Payer: BLUE CROSS/BLUE SHIELD | Admitting: Internal Medicine

## 2023-10-03 ENCOUNTER — Encounter (INDEPENDENT_AMBULATORY_CARE_PROVIDER_SITE_OTHER): Payer: Self-pay | Admitting: Residents

## 2023-10-03 VITALS — Ht 68.0 in | Wt 315.0 lb

## 2023-10-03 DIAGNOSIS — R194 Change in bowel habit: Secondary | ICD-10-CM

## 2023-10-03 MED ORDER — OMEPRAZOLE 40 MG PO CPDR
40.0000 mg | DELAYED_RELEASE_CAPSULE | Freq: Every day | ORAL | 0 refills | Status: DC
Start: 2023-10-03 — End: 2023-11-29

## 2023-10-03 NOTE — Patient Instructions (Signed)
-   Benefiber powder twice daily   - Miralax powder twice daily  - Tums-GasX or famotidine 20 to 40 mg as rescue medications if needed  - Omeprazole 40 mg 30 min before breakfast  - Small volume frequent meals daily  - Reconsideration of colonoscopy for CRC screening in 5 years given immunosupp, adopted w/o known fam hx.   - Dicyclomine as needed

## 2023-10-03 NOTE — Telephone Encounter (Signed)
Reached out to the patient in regards her video visit with Dr Jethro Bolus.. Patient did not answer the phone. Lvm asking patient to call back at [phone].

## 2023-10-03 NOTE — Progress Notes (Signed)
Follow up  Outpatient Clinic Visit     Date of Visit: 10/03/2023 3:30 PM    CC: Diarrhea   HPI: Hailey [name] is a 42 y.o. female with h/o Behcet's who was referred to GI Clinic for diarrhea.    Current GI Symptoms:  - Awaiting 48 hour EEG to see if meds need to be changed for Behcet's  - Swinging between constipation and loose stools. Hx internal hemorrhoids- every once in a while will see streak on toilet paper; nothing in bowl or mixed w/ stools.   - Hasn't been taking dicyclomine  - Notes early satiety from regular meal size that is new. Happens not everyday but notes it more frequently   - No heartburn, regurgitation  - No dysphagia, odynophagia  - Weight stable    Prior GI symptoms:  - On Humira for Behcet's disease. Switching to Remicade.   - Diagnosed 2 years ago w/ Behcet's disease, sx started 5 years ago.     - Diarrhea- 3-6 day periods of loose stools 5/day. Every 6 weeks. Has woken up in the middle of the night to have a BM. Consistency is loose/watery. Not dependent on food- tried cutting out gluten, dairy, FODMAPs. Has noted blood in the past w/ mucus. Some rectal pain/cramp with repeated stools. Started four years ago. Not sure if related to when becoming due for another Humira dose.   - When not having above episodes, BM 1-2/day, semi-formed  - Hx of CCY 2009 for nonspecific GI sx, no gallstones  - Welchol 2 tabs in the am beg 2009  - Rare Advil use    - Rare heartburn, regurgitation  - No dysphagia, odynophagia  - Retching and bringing up bilious fluid approx 1 /month  - EGD before [facility] in 2009    - Adopted, no fam hx     - EGD/colonoscopy 03/2023- gastric erythema, large ext hemorrhoids. Path: gastric bx nl, colonoscopy bx nl  - Started having seizures for the first time. Started on Depakote  - LP and f/u blood patch - neuro Behcet's  - Restarted prednisone in addition to Remicade. Also started MTX/folic acid.   - 1-2 BM/day. Occasionally will have 3-4 BM/day  - Less loose stools  since starting Depakote  - Tried dicyclomine 10 mg TID, works 1/2 the time  - Nauseated and throw up, non-bloody emesis. Thinks related to medicine. 1-2/month.  - Has seen blood in the stool in the past but not recently. Doesn't feel hemorrhoids are a problem right now.         PMH:   Past Medical History:   Diagnosis Date    Abnormal vision     reading glasses    Anxiety     Arrhythmia     WPW ablation in 2005, 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 Released from cardiology    Behcet's disease     She sees Dr. 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    02/11/23 stable on medical    Diarrhea     Ear, nose and throat disorder     seasonal allergies    Fatigue     Bechet's    Fever 11/09/2022    Headache     migraine    Hx of ovarian cyst     Lower back pain     not severe    Meningitis 2011    Viral // treated and resolved    Pain in wrist 11/09/2022  Post-operative nausea and vomiting     Scoliosis     moderate// used a Brace at a young age    Uveitis     using eyedrops       PSH:   Past Surgical History:   Procedure Laterality Date    ABLATION - PVC'S N/A 06/08/2022    Procedure: Ablation - PVC's;  Surgeon: [name], MD;  Location: [facility];  Service: Cardiovascular;  Laterality: N/A;  SAME DAY DISCHARGE  CARTO NO TEE    ARTHROTOMY, WRIST Right 12/03/2020    Procedure: RIGHT UPPER EXTREMITY SYNOVIAL BIOPSY;  Surgeon: Kellie Simmering, MD;  Location: [facility];  Service: Plastics;  Laterality: Right;    BIOPSY, LYMPH NODE N/A 07/23/2018    Procedure: BIOPSY, LYMPH NODE;  Surgeon: Shelda Pal, MD;  Location: [facility];  Service: ENT;  Laterality: N/A;  NECK DEEP LYMPH NODE BIOPSY    BONE MARROW BIOPSY  2020    led to behcet's dx    CARDIAC ABLATION  2005    [facility] for Wolff-Parkinson-White    CHOLECYSTECTOMY  2009    COLONOSCOPY, DIAGNOSTIC (SCREENING) N/A 03/21/2023    Procedure: COLONOSCOPY, DIAGNOSTIC (SCREENING);  Surgeon: Darlyn Chamber, MD;  Location: [facility];  Service: Gastroenterology;  Laterality: N/A;    DILATION AND CURETTAGE OF UTERUS  2010    EGD N/A 03/21/2023    Procedure: EGD;  Surgeon: Darlyn Chamber, MD;  Location: [facility];  Service: Gastroenterology;  Laterality: N/A;    FINGER GANGLION CYST EXCISION  04/2018    GANGLION CYST EXCISION  2014    04/2018 cyst removed from index finger-[facility]    HYSTERECTOMY  2012    HYSTEROSCOPY  2011    indication: Abnormal bleeding    LAPAROSCOPIC, LYSIS, ADHESIONS N/A 11/09/2022    Procedure: LAPAROSCOPIC LYSIS OF ADHESIONS;  Surgeon: Devota Pace, MD;  Location: [facility];  Service: Gynecology;  Laterality: N/A;    LAPAROSCOPIC, SALPINGECTOMY Left 11/09/2022    Procedure: LAPAROSCOPIC, SALPINGECTOMY LEFT;  Surgeon: Devota Pace, MD;  Location: [facility];  Service: Gynecology;  Laterality: Left;    LAPAROSCOPY, DIAGNOSTIC N/A 11/09/2022    Procedure: LAPAROSCOPY, DIAGNOSTIC;  Surgeon: Devota Pace, MD;  Location: [facility];  Service: Gynecology;  Laterality: N/A;  [id number]    UMBILICAL HERNIA REPAIR  2016    UPPER GASTROINTESTINAL ENDOSCOPY  2009    UPPER GASTROINTESTINAL ENDOSCOPY  03/2023    WISDOM TOOTH EXTRACTION  1998       Current Medications:  Outpatient Medications Marked as Taking for the 10/03/23 encounter (Telemedicine Visit) with Horris Latino, MD   Medication Sig Dispense Refill    Atogepant 60 MG Tab Take 1 tablet (60 mg) by mouth nightly For migraine prvention 30 tablet 11    botulinum toxin type A (Botox) 200 units injection Inject 155 units IM every 12 weeks 1 each 3    busPIRone (BUSPAR) 5 MG tablet Take 1 tablet (5 mg) by mouth 2 (two) times daily 180 tablet 3    clindamycin (CLEOCIN T) 1 % lotion Apply topically every morning      colesevelam (WELCHOL) 625 MG tablet Take 2 tablets (1,250 mg) by mouth every morning 180 tablet 3    Dapsone 7.5 % Gel Apply topically every morning      dicyclomine (BENTYL) 20 MG tablet TAKE 1 TABLET (20 MG) BY MOUTH EVERY 6 (SIX)  HOURS 360  tablet 3    divalproex, ER, extended release (DEPAKOTE ER) 500 MG 24 hr tablet Take 2 tablets (1,000 mg) by mouth 2 (two) times daily 360 tablet 3    dorzolamide-timolol (COSOPT) 2-0.5 % ophthalmic solution       eletriptan (RELPAX) 40 MG tablet Take 1 tablet (40 mg) by mouth as needed for Migraine .  Wait at least 2 hours prior to taking second dose.  Max 2 tabs/24 hours. Do not use more than 3 days per week. 12 tablet 5    EPINEPHrine (EPIPEN 2-PAK) 0.3 MG/0.3ML Solution Auto-injector injection Inject 0.3 mLs (0.3 mg) into the muscle once as needed (Anaphylaxis) 2 each 2    folic acid (FOLVITE) 1 MG tablet TAKE 1 TABLET BY MOUTH EVERY DAY 90 tablet 1    hydrOXYzine (ATARAX) 10 MG tablet Take 1 tablet (10 mg) by mouth nightly 90 tablet 3    inFLIXimab 100 MG injection Infuse into the vein once every eight weeks      Lacosamide (VIMPAT) 200 MG Tab Take 1 tablet (200 mg) by mouth 2 (two) times daily 180 tablet 3    lidocaine-prilocaine (EMLA) cream Apply topically as needed Dermatology procedures      Melatonin-Pyridoxine (MELATIN PO) Take 4 mg by mouth nightly      methotrexate 2.5 MG tablet TAKE 2 TABLETS (5 MG) BY MOUTH ONCE A WEEK 24 tablet 1    modafinil (PROVIGIL) 200 MG tablet Take 1 tablet (200 mg) by mouth every morning 30 tablet 5    Multiple Vitamin (MULTIVITAMIN PO) Take by mouth daily      ondansetron (ZOFRAN-ODT) 4 MG disintegrating tablet Take 1 tablet (4 mg) by mouth every 8 (eight) hours as needed for Nausea 30 tablet 2    Otezla 30 MG Tab TAKE 1 TABLET BY MOUTH 2 TIMES A DAY 180 tablet 1    predniSONE (DELTASONE) 10 MG tablet TAKE 1 TABLET (10 MG) BY MOUTH DAILY. 30 tablet 5    Rimegepant Sulfate 75 MG Tablet Dispersible Take 1 tablet (75 mg) by mouth as needed (migraine) 16 tablet 11    valACYclovir (VALTREX) 1000 MG tablet Take 2 tabs at onset; repeat once in 12 hours.  Total 4 tabs per outbreak 12 tablet 3    vitamin B-12 1000 MCG tablet Take 1 tablet (1,000 mcg) by mouth daily 30  tablet 0    VITAMIN D PO Take 1 tablet by mouth daily Dosage unsure       Current Facility-Administered Medications for the 10/03/23 encounter (Telemedicine Visit) with Horris Latino, MD   Medication Dose Route Frequency Provider Last Rate Last Admin    onabotulinumtoxin A (BOTOX) injection 175 Units  175 Units Intramuscular Q12 Vesta Mixer, MD   175 Units at 08/16/23 1013       Allergies:  Allergies   Allergen Reactions    Fruit Blend Flavor [Flavoring Agent] Anaphylaxis and Swelling     Tropical Fruit     Latex Hives, Swelling, Other (See Comments) and Drug-Induced Flushing    Adhesive [Wound Dressing Adhesive] Itching    Cyanoacrylate Itching    Other Itching and Rash     Sutures Monocryl    Oxycodone Hallucinations       Family History:   Colon cancer- None    Family History   Adopted: Yes       Social History:  Nonsmoker  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  Eyes: Denies vision or eye problems.   ENMT: Denies hearing or ear problem, nasal discharge, oral lesions or sore throat.  Respiratory: Denies wheezing or cough.  Cardiovascular: Denies chest pain or palpitations.  Gastrointestinal: See HPI.  Genitourinary: Denies urinary symptoms or problems.  Musculoskeletal: Denies back or joint pain or problem.  Neurologic: Denies slurred speech, hemiplegia or focal deficit.  Psychiatric: Denies depression or anxiety.   Integumentary:  Denies skin rashes or jaundice.    Physical Exam:  Ht 1.727 m (5\' 8" )   Wt (!) 142.9 kg (315 lb)   LMP  (LMP Unknown)   BMI 47.90 kg/m        08/16/2023     9:07 AM 08/22/2023     2:32 PM 10/03/2023     2:09 PM   Weight Monitoring   Height 172.7 cm  172.7 cm   Weight -- 145.786 kg 142.883 kg   BMI (calculated)   47.9 kg/m2       General: Well developed and well nourished, no acute distress.  Eyes: Sclera anicteric, conjunctiva normal and no ptosis.  HENT:  Nose and ears appear  normal.  Oropharynx clear.  Chest/Lungs: No respiratory distress or abnormal audible sound.   Heart: Normal rate, regular rhythm.   Abdomen: Soft, non-distended with bowel sounds. No tenderness to palpation. No mass/organomegaly.  Musculoskeletal: No gross deformity, tenderness, swelling.  Extremities:  No edema, cyanosis.  Skin: Normal color and turgor, no rashes, no suspicious skin lesions noted.  Neurologic: Alert and oriented.  No obvious focal deficits.  Psychiatric: Cooperative, appropriate mood, normal affect.    Labs Reviewed:  Recent Labs     06/26/23  1330 05/01/23  1412 03/06/23  1355   WBC 11.49* 12.45* 9.73*   Hemoglobin 14.2 14.0  --    Hgb  --   --  13.7   Hematocrit 40.9 41.3 40.3   Platelet Count 294 349*  --    Platelets  --   --  360*       Recent Labs     02/06/23  0754 01/27/23  1352 04/22/18  1436   PT 11.2 11.3 12.1*   PT INR 1.0 1.0 0.9       Recent Labs     08/22/23  1417 06/26/23  1330 05/01/23  1412 01/23/23  1335 01/23/23  1238 05/27/22  1001 01/14/22  0000 02/23/21  0743 05/18/20  0824 02/06/20  1244 07/26/19  1442   Sodium 139 140 138   < >  --    < > 141   < > 139 140 139   Potassium 3.9 4.0 3.7   < >  --    < > 5.1   < > 4.7 4.2 3.7   Chloride 105 105 106   < >  --    < > 105   < > 101 104 104   CO2 27 25 23    < >  --    < > 18*   < > 17* 24 23   BUN 15 16 13    < >  --    < > 13   < > 11 8.0 15.2   Creatinine 0.8 0.8 0.8   < >  --    < > 0.82   < > 0.74 0.8 0.8   Calcium 10.0 9.7 9.5   < >  --    < > 9.8   < >  9.8 9.3 9.6   Albumin 3.9 3.8 3.8   < >  --    < > 4.8   < > 4.4 4.2 4.1   Protein, Total 7.4 7.2 7.3   < >  --    < > 7.4   < > 7.4 7.9 7.3   Bilirubin, Total 0.5 0.4 0.4   < >  --    < > 0.4   < > 0.4 0.5 0.4   Alkaline Phosphatase 44 40 50   < >  --    < > 62   < > 87 80 63   ALT 82* 93* 81*   < >  --    < > 28   < > 70* 63* 50   AST (SGOT) 44* 53* 44*   < >  --    < > 22   < > 59* 46* 23   Glucose 109* 96 113*   < >  --    < > 99   < > 113* 97 88   TSH  --   --   --   --   1.36  --  2.040  --   --  2.41  --    Ferritin  --   --   --   --   --   --   --   --  291*  --  176.64    < > = values in this interval not displayed.        No results found for: "BNP"    Lab Results   Component Value Date    FERRITIN 291 (H) 05/18/2020       Lab Results   Component Value Date    VITD 28 (L) 01/23/2023       Lab Results   Component Value Date    TSH 1.36 01/23/2023       Imaging/Procedures Reviewed:  No results found.   Results for orders placed during the hospital encounter of 10/11/22    CT Abdomen Pelvis W IV/ WO PO Cont    Narrative  HISTORY: Left lower quadrant pain. Concern for diverticulitis. Known  vaginal cyst.    COMPARISON: 09/01/2018.    TECHNIQUE: CT of the abdomen and pelvis performed with intravenous  contrast. The following dose reduction techniques were utilized: automated  exposure control and/or adjustment of the mA and/or KV according to patient  size, and the use of an iterative reconstruction technique.    CONTRAST: iohexol (OMNIPAQUE) 350 MG/ML injection 100 mL    FINDINGS:    Abdomen:  The liver demonstrates mild steatosis. There are no focal liver lesions.  Gallbladder is surgically absent with normal caliber bile ducts. Spleen,  pancreas, adrenal glands, and kidneys are unremarkable. There are no renal  calculi and there is no hydronephrosis.    There is no abdominal or retroperitoneal lymphadenopathy. There is no  ascites. Abdominal aorta and IVC are normal caliber and patent. There are  no abdominal wall hernias.    The stomach is non-distended. There is no appreciable small bowel  thickening or obstruction. Appendix is normal. Colon is non-distended.  There is no colitis, diverticulitis, abscess, or free air.    Pelvis:  The uterus is surgically absent. Cyst near the upper vagina now measures  3.8 cm. Bladder is decompressed. Deep pelvic veins are patent. There are no  inguinal hernias.    Skeletal:  There is mild degenerative arthritis and minimal levoscoliosis  in  the  lumbar spine. Minimal osteoarthritis is seen at the left SI joint. There  are no compression fractures.    Lower chest:  Lung bases are clear. There are no pleural effusions. Heart is normal size.    Impression  1. No detectable acute abnormalities.  2. No bowel obstruction or detectable bowel inflammation; no  diverticulitis.  3. No renal calculi or hydronephrosis.  4. Remainder as above.    Wilmon Pali, MD  10/11/2022 5:24 PM     Results for orders placed during the hospital encounter of 07/24/20    MRI Abdomen W WO Contrast    Narrative  [facility]  [facility]    JANEIA, SPANBAUER PERFORMED AT:  [id number]       DOB:[birth year]                 [address]  07/24/2020     AGE:71   Gender:F              Physicians Only: [phone]        Synetta Shadow MD                                  [F]  [address]  [place], [place] [address]        MRI ABDOMEN MRCP WITHOUT AND WITH CONTRAST    HISTORY: Fever of unknown origin. Elevated liver enzymes. Evaluate for  biliary disease or liver mass.    COMPARISON: PET/CT dated 12/03/2019    TECHNIQUE: Multisequence MR imaging of the abdomen utilizing T1 and T2  weighted imaging including postcontrast LAVA and MRCP sequences. 3-D/MIP  images were reviewed.    Contrast: 10 cc GADAVIST administered.    FINDINGS:    Diffuse hepatic steatosis with a small area of fat sparing in inferior  segment 4. No focal suspicious hepatic lesions. Patent portal veins. No  biliary duct dilatation or choledocholithiasis. Status post  cholecystectomy.    Unremarkable spleen, pancreas, adrenals, and kidneys. No hydronephrosis.  Normal aortic caliber. No enlarged lymph nodes in the abdomen. Nondistended  bowel. No acute infectious/inflammatory process in the abdomen.    Impression  1. Diffuse hepatic steatosis. No suspicious focal hepatic lesions.  2. No biliary ductal dilatation or  choledocholithiasis.    Electronically signed by: Carlynn Spry M.D.  [Interpreted at: 02WDB]  [facility]    EDH: 07/25/20       Assessment and Plan:  42 yo F with Behcet's disease, recent course c/b neuro-Behcet's now on pred, MTX, Depakote (d/t seizures). EGD/colonoscopy 03/2023 nl   Notes vacillation w/ constipation and loose stools, likely overflow  Feels full for a long time after eating certain meals but not all.  No emesis, dysphagia, odynophagia, weight loss    - Benefiber powder twice daily   - Miralax powder twice daily  - Tums-GasX or famotidine 20 to 40 mg as rescue medications if needed  - Omeprazole 40 mg 30 min before breakfast  - Small volume frequent meals daily  - Reconsideration of colonoscopy for CRC screening in 5 years given immunosupp, adopted w/o known fam hx.   - Dicyclomine as needed      The risks and  benefits of my recommendations, as well as other treatment options were discussed with the patient today. Time given to ask questions and all questions were answered. Patient strongly encouraged to set up follow up and reach out regarding any questions.     Follow up: 3 mo    I spent 40 min providing care on date of clinical encounter    Letter sent to referring provider: Yes    Horris Latino, M.D.  [facility]  Department of Medicine  [facility]

## 2023-10-04 ENCOUNTER — Encounter (INDEPENDENT_AMBULATORY_CARE_PROVIDER_SITE_OTHER): Payer: Self-pay

## 2023-10-04 ENCOUNTER — Encounter (INDEPENDENT_AMBULATORY_CARE_PROVIDER_SITE_OTHER): Payer: Self-pay | Admitting: Family Medicine

## 2023-10-04 ENCOUNTER — Other Ambulatory Visit (INDEPENDENT_AMBULATORY_CARE_PROVIDER_SITE_OTHER): Payer: Self-pay | Admitting: Family Medicine

## 2023-10-04 DIAGNOSIS — H00014 Hordeolum externum left upper eyelid: Secondary | ICD-10-CM

## 2023-10-04 MED ORDER — ERYTHROMYCIN 5 MG/GM OP OINT
TOPICAL_OINTMENT | Freq: Three times a day (TID) | OPHTHALMIC | 0 refills | Status: AC
Start: 2023-10-04 — End: 2023-10-14

## 2023-10-06 ENCOUNTER — Encounter: Payer: Self-pay | Admitting: Internal Medicine

## 2023-10-13 ENCOUNTER — Ambulatory Visit: Payer: BLUE CROSS/BLUE SHIELD

## 2023-10-16 ENCOUNTER — Emergency Department
Admission: EM | Admit: 2023-10-16 | Discharge: 2023-10-16 | Disposition: A | Discharge status: Home / Self Care | Payer: BLUE CROSS/BLUE SHIELD | Attending: Emergency Medicine | Admitting: Emergency Medicine

## 2023-10-16 ENCOUNTER — Other Ambulatory Visit (INDEPENDENT_AMBULATORY_CARE_PROVIDER_SITE_OTHER): Payer: BLUE CROSS/BLUE SHIELD

## 2023-10-16 ENCOUNTER — Emergency Department: Payer: BLUE CROSS/BLUE SHIELD

## 2023-10-16 ENCOUNTER — Ambulatory Visit: Payer: BLUE CROSS/BLUE SHIELD

## 2023-10-16 ENCOUNTER — Encounter (INDEPENDENT_AMBULATORY_CARE_PROVIDER_SITE_OTHER): Payer: Self-pay

## 2023-10-16 DIAGNOSIS — M545 Low back pain, unspecified: Secondary | ICD-10-CM | POA: Insufficient documentation

## 2023-10-16 LAB — LAB USE ONLY - CBC WITH DIFFERENTIAL
Absolute Basophils: 0.04 10*3/uL (ref 0.00–0.08)
Absolute Eosinophils: 0.03 10*3/uL (ref 0.00–0.44)
Absolute Immature Granulocytes: 0.08 10*3/uL — ABNORMAL HIGH (ref 0.00–0.07)
Absolute Lymphocytes: 2.25 10*3/uL (ref 0.42–3.22)
Absolute Monocytes: 0.62 10*3/uL (ref 0.21–0.85)
Absolute Neutrophils: 8.47 10*3/uL — ABNORMAL HIGH (ref 1.10–6.33)
Absolute nRBC: 0 10*3/uL (ref <=0.00)
Basophils %: 0.3 %
Eosinophils %: 0.3 %
Hematocrit: 41.7 % (ref 34.7–43.7)
Hemoglobin: 14.2 g/dL (ref 11.4–14.8)
Immature Granulocytes %: 0.7 %
Lymphocytes %: 19.6 %
MCH: 33.6 pg — ABNORMAL HIGH (ref 25.1–33.5)
MCHC: 34.1 g/dL (ref 31.5–35.8)
MCV: 98.6 fL — ABNORMAL HIGH (ref 78.0–96.0)
MPV: 9 fL (ref 8.9–12.5)
Monocytes %: 5.4 %
Neutrophils %: 73.7 %
Platelet Count: 309 10*3/uL (ref 142–346)
Preliminary Absolute Neutrophil Count: 8.47 10*3/uL — ABNORMAL HIGH (ref 1.10–6.33)
RBC: 4.23 10*6/uL (ref 3.90–5.10)
RDW: 12 % (ref 11–15)
WBC: 11.49 10*3/uL — ABNORMAL HIGH (ref 3.10–9.50)
nRBC %: 0 /100{WBCs} (ref <=0.0)

## 2023-10-16 LAB — COMPREHENSIVE METABOLIC PANEL
ALT: 73 U/L — ABNORMAL HIGH (ref <=55)
AST (SGOT): 49 U/L — ABNORMAL HIGH (ref <=41)
Albumin/Globulin Ratio: 1.1 (ref 0.9–2.2)
Albumin: 3.8 g/dL (ref 3.5–5.0)
Alkaline Phosphatase: 43 U/L (ref 37–117)
Anion Gap: 10 (ref 5.0–15.0)
BUN: 17 mg/dL (ref 7–21)
Bilirubin, Total: 0.3 mg/dL (ref 0.2–1.2)
CO2: 21 meq/L (ref 17–29)
Calcium: 9.4 mg/dL (ref 8.5–10.5)
Chloride: 107 meq/L (ref 99–111)
Creatinine: 0.8 mg/dL (ref 0.4–1.0)
GFR: >60 mL/min/{1.73_m2} (ref >=60.0)
Globulin: 3.4 g/dL (ref 2.0–3.6)
Glucose: 109 mg/dL — ABNORMAL HIGH (ref 70–100)
Potassium: 4.4 meq/L (ref 3.5–5.3)
Protein, Total: 7.2 g/dL (ref 6.0–8.3)
Sodium: 138 meq/L (ref 135–145)

## 2023-10-16 LAB — BETA HCG QUANTITATIVE, PREGNANCY: hCG, Quantitative: <2.4 m[IU]/mL

## 2023-10-16 MED ORDER — GABAPENTIN 100 MG PO CAPS
100.0000 mg | ORAL_CAPSULE | Freq: Three times a day (TID) | ORAL | 0 refills | Status: DC
Start: 2023-10-16 — End: 2023-11-02

## 2023-10-16 MED ORDER — GABAPENTIN 100 MG PO CAPS
100.0000 mg | ORAL_CAPSULE | Freq: Once | ORAL | Status: AC
Start: 2023-10-16 — End: 2023-10-16
  Administered 2023-10-16: 100 mg via ORAL
  Filled 2023-10-16: qty 1

## 2023-10-16 MED ORDER — KETOROLAC TROMETHAMINE 30 MG/ML IJ SOLN
30.0000 mg | Freq: Once | INTRAMUSCULAR | Status: AC
Start: 2023-10-16 — End: 2023-10-16
  Administered 2023-10-16: 30 mg via INTRAVENOUS
  Filled 2023-10-16: qty 1

## 2023-10-16 MED ORDER — DIAZEPAM 5 MG/ML IJ SOLN
2.5000 mg | Freq: Once | INTRAMUSCULAR | Status: AC
Start: 2023-10-16 — End: 2023-10-16
  Administered 2023-10-16: 2.5 mg via INTRAVENOUS
  Filled 2023-10-16: qty 2

## 2023-10-16 MED ORDER — DIAZEPAM 2 MG PO TABS
2.0000 mg | ORAL_TABLET | Freq: Four times a day (QID) | ORAL | 0 refills | Status: DC | PRN
Start: 2023-10-16 — End: 2023-10-24

## 2023-10-16 NOTE — ED Provider Notes (Signed)
[facility] EMERGENCY DEPARTMENT  ATTENDING PHYSICIAN HISTORY AND PHYSICAL EXAM     Patient Name: Hailey [name], Hailey [name]  Department:[facility] EMERGENCY DEPT  Encounter Date:  10/16/2023  Attending Physician: Devin Going, MD   Age: 42 y.o. female  Patient Room: Ailene Ravel  PCP: Virgilio Frees, MD           Diagnosis/Disposition:     Final diagnoses:   Acute right-sided low back pain without sciatica       ED Disposition       ED Disposition   Discharge    Condition   --    Date/Time   Mon Oct 16, 2023  7:56 PM    Comment   Orvetta Barrientez discharge to home/self care.    Condition at disposition: Stable                 Follow-Up Providers (if applicable)    [facility]  [address]  [place] [address]  [phone]  Schedule an appointment as soon as possible for a visit          Discharge Medication List as of 10/16/2023  8:00 PM        START taking these medications    Details   diazePAM (VALIUM) 2 MG tablet Take 1 tablet (2 mg) by mouth every 6 (six) hours as needed (back pain), Starting Mon 10/16/2023, E-Rx      gabapentin (NEURONTIN) 100 MG capsule Take 1 capsule (100 mg) by mouth 3 (three) times daily, Starting Mon 10/16/2023, E-Rx                 Medical Decision Making:     Initial Differential Diagnosis:  Initial differential diagnosis to include but not limited to: sacroiliitis, sciatica, disc herniation, muscle spasm, pathologic fracture    Plan:  41 y/o female presenting with right low back pain.  She appears uncomfortable on arrival and is tachycardic.  She is given 2.5 IV valium, 30mg  IV toradol for pain relief  She is afebrile, WBC elevated to 11.49   CMP with glucose 109, AST 49, ALT 73 otherwise unremarkable  Pregnancy negative  CT lumbar spine showing no acute process.  There are degenerative changes but no appreciable large disc protrusion  On reevaluation, the patient feels better.  Her HR is improved.  She is able to stand and ambulate.  Discussed home pain management.   She agrees with Valium, over-the-counter Tylenol/ibuprofen, and will try gabapentin for additional pain relief.  She will continue to follow-up as outpatient and return if needed    Final Impression:  Acute right low back pain  The patient was deemed stable for discharge. They were given strict return precautions as it relates to their presumed diagnosis, verbalized understanding of these precautions and agreed to follow up as instructed. All questions were answered prior to discharge.         Medical Decision Making    Records Reviewed (internal and external)? : physician office records including telemedicine progress note from 10/03/23        History of Presenting Illness:     Nursing Triage note: wheeled in to triage c/o back spasm on the right lower spasm for 1 week, today spasm is worst, have difficulty ambulating. denies any injury or trauma. pt took muscle relaxants and pain medications no relief. denies any issues with bowel and bladder control. denies any numbness or pain on lower extremities. pt is AOX4. appeared to be uncomfortable  during triage due to pain.  Chief complaint: Back Pain    Hailey [name] is a 42 y.o. female with PMH of Behcet's disease, meningitis, scoliosis, seronegative rheumatoid arthritis, PVCs, nonischemic cardiomyopathy, and procedural history (hysterectomy in 2012; PVC ablation on 06/08/2022; left laparoscopic salpingectomy on 11/09/2022; EGD on 03/21/2023) presents to to the ED for the concern of right-sided low back pain for the past week.     She has been experiencing constant right sided pain with intermittent severe / spasms of apin. This has increased to the point where the pain is at a constant 8/10, with some instances of 10/10 pain. She has muscle relaxants, ibuprofen and Aleve, with no improvement to the symptoms.     Today, her spasms were painful to the point became worse and she wasn't able to stand out of a recliner    Denies radiating pain to the legs. Endorses  the pain does start at right of the spine and wraps around to the right flank.     Recently started modafinil     Endorses nausea. Took Zofran today around 3:30pm.  Denies dysuria, hematuria, increased urinary frequency or incontinence.   Denies abdominal pain.          Review of Systems:  Physical Exam:     Review of Systems    Positive and negative ROS per above and in HPI. All other systems reviewed and negative.     Triage Vitals: Pulse (!) 108  BP (!) 153/98  Resp 20  SpO2 95 %  Temp 98.4 F (36.9 C)     Physical Exam  Vitals and nursing note reviewed.   Constitutional:       Comments: Appears uncomfortable   HENT:      Head: Normocephalic and atraumatic.      Nose: Nose normal.      Mouth/Throat:      Mouth: Mucous membranes are moist.   Eyes:      Extraocular Movements: Extraocular movements intact.      Pupils: Pupils are equal, round, and reactive to light.   Cardiovascular:      Rate and Rhythm: Regular rhythm. Tachycardia present.   Pulmonary:      Effort: Pulmonary effort is normal. No respiratory distress.   Abdominal:      General: There is no distension.      Palpations: Abdomen is soft.      Tenderness: There is no abdominal tenderness. There is no guarding.   Musculoskeletal:      Right lower leg: No edema.      Left lower leg: No edema.   Skin:     General: Skin is warm and dry.   Neurological:      Mental Status: She is alert and oriented to person, place, and time.      Comments: Sensation intact to light touch in b/l LE.  (+) dorsiflexion of the great toe b/l                 Interpretations, Clinical Decision Tools and Critical Care:       O2 Sat:  The patient's oxygen saturation was 95 % on room air. This was independently interpreted by me as Normal.         Procedures:   Procedures      Attestations:     Scribe Attestation: I was acting as a Neurosurgeon for Devin Going, MD on [facility] LINDA   Treatment Team: Scribe: Gildardo Cranker  Documentation Notes:  Parts of this note  were generated by the Epic EMR system/ Dragon speech recognition and may contain inherent errors or omissions not intended by the user. Grammatical errors, random word insertions, deletions, pronoun errors and incomplete sentences are occasional consequences of this technology due to software limitations. Not all errors are caught or corrected.  My documentation is often completed after the patient is no longer under my clinical care. In some cases, the Epic EMR may pull updated results into the above documentation which may not reflect all results or information that were available to me at the time of my medical decision making.   If there are questions or concerns about the content of this note or information contained within the body of this dictation they should be addressed directly with the author for clarification.                  Devin Going, MD  10/16/23 [phone]

## 2023-10-16 NOTE — ED Triage Notes (Signed)
+  R lower back pain/spasms x1wk. Has extensive history r/t chronic back pain but states this feels different. Reports she is due for Remicade infusion for Brehcet's Disease. Denies incontinence, saddle N&T. States pain unrelieved by home muscle relaxants. Denies f/c. ABCs intact and respirations even and unlabored on RA. A&Ox4 and speaking in full, clear sentences. WC to triage- has been ambulatory at home with cane, but becoming more difficult d/t spasms.

## 2023-10-16 NOTE — ED Notes (Signed)
I have assisted in the ordering of diagnostic studies necessary to expedite care in triage and initial testing was ordered based on presenting complaint. I am not the primary physician for this patient.       Elesa Hacker, MD  10/16/23 [phone]

## 2023-10-16 NOTE — Discharge Instructions (Signed)
Dear Mrs. Racey:    Thank you for choosing the [facility] Emergency Department, the premier emergency department in the  area.  I hope your visit today was EXCELLENT. You will receive a survey via text message that will give you the opportunity to provide feedback to your team about your visit. Please do not hesitate to reach out with any questions!    Specific instructions for your visit today:    Do not drive after taking Valium    IF YOU DO NOT CONTINUE TO IMPROVE OR YOUR CONDITION WORSENS, PLEASE CONTACT YOUR DOCTOR OR RETURN IMMEDIATELY TO THE EMERGENCY DEPARTMENT.    Sincerely,  Devin Going, MD  Attending Emergency Physician  [facility] Emergency Department      OBTAINING A PRIMARY CARE APPOINTMENT    Primary care physicians (PCPs, also known as primary care doctors) are either internists or family medicine doctors. Both types of PCPs focus on health promotion, disease prevention, patient education and counseling, and treatment of acute and chronic medical conditions.    If you need a primary care doctor, please call the below number and ask who is receiving new patients.     [facility]  Telephone:  [phone]  [email or web address]    DOCTOR REFERRALS  Call [phone] (available 24 hours a day, 7 days a week) if you need any further referrals and we can help you find a primary care doctor or specialist.  Also, available online at:  [email or web address]    YOUR CONTACT INFORMATION  Before leaving please check with registration to make sure we have an up-to-date contact number.  You can call registration at [phone] to update your information.  For questions about your hospital bill, please call [phone].  For questions about your Emergency Dept Physician bill please call [phone].      FREE HEALTH SERVICES  If you need help with health or social services, please call 2-1-1 for a free referral to resources in your area.  2-1-1 is a  free service connecting people with information on health insurance, free clinics, pregnancy, mental health, dental care, food assistance, housing, and substance abuse counseling.  Also, available online at:  http://www.[facility].org    ORTHOPEDIC INJURY   Please know that significant injuries can exist even when an initial x-ray is read as normal or negative.  This can occur because some fractures (broken bones) are not initially visible on x-rays.  For this reason, close outpatient follow-up with your primary care doctor or bone specialist (orthopedist) is required.    MEDICATIONS AND FOLLOWUP  Please be aware that some prescription medications can cause drowsiness.  Use caution when driving or operating machinery.    The examination and treatment you have received in our Emergency Department is provided on an emergency basis, and is not intended to be a substitute for your primary care physician.  It is important that your doctor checks you again and that you report any new or remaining problems at that time.      ASSISTANCE WITH INSURANCE    Affordable Care Act  [facility])  Call to start or finish an application, compare plans, enroll or ask a question.  [phone]  TTY: [phone]  Web:  Healthcare.gov    Help Enrolling in [facility]  [employer] [place]  708-630-2597 (TOLL-FREE)  (979)839-5088 (TTY)  Web:  Http://www.coverva.org    Local Help Enrolling in the [facility]  [place] [facility]  787-371-5960 (MAIN)  Email:  health-help@nvfs .org  Web:  BlackjackMyths.is  Address:  [address], [address] [place], [place] [address]    SEDATING MEDICATIONS  Sedating medications include strong pain medications (e.g. narcotics), muscle relaxers, benzodiazepines (used for anxiety and as muscle relaxers), Benadryl/diphenhydramine and other antihistamines for allergic reactions/itching, and other medications.  If you are unsure if you have received a sedating medication, please ask your physician or  nurse.  If you received a sedating medication: DO NOT drive a car. DO NOT operate machinery. DO NOT perform jobs where you need to be alert.  DO NOT drink alcoholic beverages while taking this medicine.     If you get dizzy, sit or lie down at the first signs. Be careful going up and down stairs.  Be extra careful to prevent falls.     Never give this medicine to others.     Keep this medicine out of reach of children.     Do not take or save old medicines. Throw them away when outdated.     Keep all medicines in a cool, dry place. DO NOT keep them in your bathroom medicine cabinet or in a cabinet above the stove.    MEDICATION REFILLS  Please be aware that we cannot refill any prescriptions through the ER. If you need further treatment from what is provided at your ER visit, please follow up with your primary care doctor or your pain management specialist.    FREESTANDING EMERGENCY DEPARTMENTS OF [facility]  Did you know [facility] has two freestanding ERs located just a few miles away?  [facility] [place] and [facility] [place] have short wait times, easy free parking directly in front of the building and top patient satisfaction scores - and the same Board Certified Emergency Medicine doctors as [facility].

## 2023-10-17 ENCOUNTER — Ambulatory Visit: Payer: BLUE CROSS/BLUE SHIELD

## 2023-10-17 ENCOUNTER — Ambulatory Visit (INDEPENDENT_AMBULATORY_CARE_PROVIDER_SITE_OTHER): Payer: BLUE CROSS/BLUE SHIELD | Admitting: Gastroenterology

## 2023-10-17 ENCOUNTER — Other Ambulatory Visit (INDEPENDENT_AMBULATORY_CARE_PROVIDER_SITE_OTHER): Payer: BLUE CROSS/BLUE SHIELD

## 2023-10-17 ENCOUNTER — Encounter: Payer: Self-pay | Admitting: Internal Medicine

## 2023-10-17 VITALS — BP 115/81 | HR 81 | Temp 97.5°F | Wt 317.6 lb

## 2023-10-17 DIAGNOSIS — R569 Unspecified convulsions: Secondary | ICD-10-CM

## 2023-10-17 DIAGNOSIS — M352 Behcet's disease: Secondary | ICD-10-CM

## 2023-10-17 DIAGNOSIS — M0600 Rheumatoid arthritis without rheumatoid factor, unspecified site: Secondary | ICD-10-CM

## 2023-10-17 DIAGNOSIS — D84821 Immunodeficiency due to drugs: Secondary | ICD-10-CM

## 2023-10-17 DIAGNOSIS — R52 Pain, unspecified: Secondary | ICD-10-CM

## 2023-10-17 DIAGNOSIS — H209 Unspecified iridocyclitis: Secondary | ICD-10-CM | POA: Insufficient documentation

## 2023-10-17 LAB — LAB USE ONLY - CBC WITH DIFFERENTIAL
Absolute Basophils: 0.04 10*3/uL (ref 0.00–0.08)
Absolute Eosinophils: 0.16 10*3/uL (ref 0.00–0.44)
Absolute Immature Granulocytes: 0.07 10*3/uL (ref 0.00–0.07)
Absolute Lymphocytes: 3.75 10*3/uL — ABNORMAL HIGH (ref 0.42–3.22)
Absolute Monocytes: 0.85 10*3/uL (ref 0.21–0.85)
Absolute Neutrophils: 5.38 10*3/uL (ref 1.10–6.33)
Absolute nRBC: 0 10*3/uL (ref <=0.00)
Basophils %: 0.4 %
Eosinophils %: 1.6 %
Hematocrit: 41.8 % (ref 34.7–43.7)
Hemoglobin: 14.2 g/dL (ref 11.4–14.8)
Immature Granulocytes %: 0.7 %
Lymphocytes %: 36.6 %
MCH: 34.4 pg — ABNORMAL HIGH (ref 25.1–33.5)
MCHC: 34 g/dL (ref 31.5–35.8)
MCV: 101.2 fL — ABNORMAL HIGH (ref 78.0–96.0)
MPV: 8.6 fL — ABNORMAL LOW (ref 8.9–12.5)
Monocytes %: 8.3 %
Neutrophils %: 52.4 %
Platelet Count: 275 10*3/uL (ref 142–346)
Preliminary Absolute Neutrophil Count: 5.38 10*3/uL (ref 1.10–6.33)
RBC: 4.13 10*6/uL (ref 3.90–5.10)
RDW: 13 % (ref 11–15)
WBC: 10.25 10*3/uL — ABNORMAL HIGH (ref 3.10–9.50)
nRBC %: 0 /100{WBCs} (ref <=0.0)

## 2023-10-17 LAB — COMPREHENSIVE METABOLIC PANEL
ALT: 69 U/L — ABNORMAL HIGH (ref <=55)
AST (SGOT): 34 U/L (ref <=41)
Albumin/Globulin Ratio: 1.2 (ref 0.9–2.2)
Albumin: 3.7 g/dL (ref 3.5–5.0)
Alkaline Phosphatase: 42 U/L (ref 37–117)
Anion Gap: 10 (ref 5.0–15.0)
BUN: 18 mg/dL (ref 7–21)
Bilirubin, Total: 0.4 mg/dL (ref 0.2–1.2)
CO2: 25 meq/L (ref 17–29)
Calcium: 9.9 mg/dL (ref 8.5–10.5)
Chloride: 105 meq/L (ref 99–111)
Creatinine: 0.8 mg/dL (ref 0.4–1.0)
GFR: >60 mL/min/{1.73_m2} (ref >=60.0)
Globulin: 3.1 g/dL (ref 2.0–3.6)
Glucose: 80 mg/dL (ref 70–100)
Hemolysis Index: 7 {index}
Potassium: 4 meq/L (ref 3.5–5.3)
Protein, Total: 6.8 g/dL (ref 6.0–8.3)
Sodium: 140 meq/L (ref 135–145)

## 2023-10-17 LAB — C-REACTIVE PROTEIN: C-Reactive Protein: <0.1 mg/dL (ref 0.0–1.1)

## 2023-10-17 LAB — SEDIMENTATION RATE: Sed Rate: 3 mm/h (ref <=20)

## 2023-10-17 MED ORDER — DIPHENHYDRAMINE HCL 50 MG/ML IJ SOLN
25.0000 mg | Freq: Once | INTRAMUSCULAR | Status: DC | PRN
Start: 2023-10-17 — End: 2023-10-17
  Filled 2023-10-17: qty 1

## 2023-10-17 MED ORDER — SODIUM CHLORIDE 0.9 % IV BOLUS
500.0000 mL | Freq: Once | INTRAVENOUS | Status: DC | PRN
Start: 2023-10-17 — End: 2023-10-17

## 2023-10-17 MED ORDER — SODIUM CHLORIDE (PF) 0.9 % IJ SOLN
5.0000 mL | INTRAMUSCULAR | Status: DC | PRN
Start: 2023-10-17 — End: 2023-10-17

## 2023-10-17 MED ORDER — SODIUM CHLORIDE 0.9 % IV SOLN
700.0000 mg | Freq: Once | INTRAVENOUS | Status: AC
Start: 2023-10-17 — End: 2023-10-17
  Administered 2023-10-17: 700 mg via INTRAVENOUS
  Filled 2023-10-17: qty 700

## 2023-10-17 MED ORDER — ACETAMINOPHEN 325 MG PO TABS
650.0000 mg | ORAL_TABLET | Freq: Once | ORAL | Status: AC
Start: 2023-10-17 — End: 2023-10-17
  Administered 2023-10-17: 650 mg via ORAL
  Filled 2023-10-17: qty 2

## 2023-10-17 MED ORDER — DIPHENHYDRAMINE HCL 50 MG/ML IJ SOLN
25.0000 mg | Freq: Once | INTRAMUSCULAR | Status: AC
Start: 2023-10-17 — End: 2023-10-17
  Administered 2023-10-17: 25 mg via INTRAVENOUS

## 2023-10-17 MED ORDER — EPINEPHRINE 1 MG/ML ANAPHYLAXIS KIT IM USE
0.3000 mg | Freq: Once | INTRAMUSCULAR | Status: DC | PRN
Start: 2023-10-17 — End: 2023-10-17

## 2023-10-17 MED ORDER — ALBUTEROL SULFATE (2.5 MG/3ML) 0.083% IN NEBU
2.5000 mg | INHALATION_SOLUTION | Freq: Once | RESPIRATORY_TRACT | Status: DC | PRN
Start: 2023-10-17 — End: 2023-10-17

## 2023-10-17 MED ORDER — DIPHENHYDRAMINE HCL 50 MG/ML IJ SOLN
50.0000 mg | Freq: Once | INTRAMUSCULAR | Status: DC | PRN
Start: 2023-10-17 — End: 2023-10-17

## 2023-10-17 MED ORDER — FAMOTIDINE 10 MG/ML IV SOLN (WRAP)
20.0000 mg | Freq: Once | INTRAVENOUS | Status: DC | PRN
Start: 2023-10-17 — End: 2023-10-17

## 2023-10-17 MED ORDER — METHYLPREDNISOLONE SODIUM SUCC 40 MG IJ SOLR (WRAP)
40.0000 mg | Freq: Once | INTRAMUSCULAR | Status: DC | PRN
Start: 2023-10-17 — End: 2023-10-17
  Filled 2023-10-17: qty 1

## 2023-10-17 MED ORDER — GABAPENTIN 100 MG PO CAPS
100.0000 mg | ORAL_CAPSULE | Freq: Once | ORAL | Status: AC
Start: 2023-10-17 — End: 2023-10-17
  Administered 2023-10-17: 100 mg via ORAL
  Filled 2023-10-17: qty 1

## 2023-10-17 MED ORDER — METHYLPREDNISOLONE SODIUM SUCC 40 MG IJ SOLR
40.0000 mg | Freq: Once | INTRAMUSCULAR | Status: AC
Start: 2023-10-17 — End: 2023-10-17
  Administered 2023-10-17: 40 mg via INTRAVENOUS

## 2023-10-17 NOTE — Progress Notes (Signed)
[facility] - Adult Infusion  Visit Date: 10/17/2023    Hailey [name] is a 42 y.o. female patient.    Patient presents for Treatment #7 of Infliximab. Pt arrives ambulatory w/ visitor. She denies fevers, chills, cough, SOB, or throat pain.     Pt was seen in the ED yesterday for severe back pain w/ spasms. CT lumbar spine showing no acute process. There are degenerative changes, but no appreciable large disc protrusion. She was discharged home on Valium and 100 mg Gabapentin TID. She has not been able to pick either up yet from [facility], but had a left over Valium from an MRI she took earlier this am. Back pain 6/10 this am, NP Rodney Cruise notified and pt received 100 mg Gabapentin at 10:57. Pt reports ongoing fatigue and blurred vision. Denies worsening symptoms. She reports a decrease in her appetite, weight stable. She reports regular Bms. Her left ankle continues w/ +2 LEE. She denies any changes to this, denies pain, no erythema noted.     IV Access: Left forearm 24 g PIV inserted by CT Emma, +BBR. Filter applied to Infliximab infusion    Pre Meds: 650 mg PO Tylenol, 25 mg IV Benadryl, 40 mg IV Solu-Medrol     Pt tolerated infusion over 2 hrs w/o incident.       Vital Signs:  Patient Vitals for the past 12 hrs:   BP Temp Pulse   10/17/23 1334 115/81 -- 81   10/17/23 1120 125/74 -- 78   10/17/23 1002 (!) 132/92 97.5 F (36.4 C) 91      Administrations This Visit       acetaminophen (TYLENOL) tablet 650 mg       Admin Date  10/17/2023  10:47 Action  Given Dose  650 mg Route  Oral Ordering Provider  Baltazar Najjar, MD              diphenhydrAMINE (BENADRYL) injection 25 mg       Admin Date  10/17/2023  10:46 Action  Given Dose  25 mg Route  Intravenous Ordering Provider  Baltazar Najjar, MD              gabapentin (NEURONTIN) capsule 100 mg       Admin Date  10/17/2023  10:57 Action  Given Dose  100 mg Route  Oral Ordering Provider  Steimer, Lamar Blinks, NP               inFLIXimab-axxq (AVSOLA) 700 mg in sodium chloride 0.9 % 250 mL infusion       Admin Date  10/17/2023  11:25 Action  New Bag Dose  700 mg Rate  125 mL/hr Route  Intravenous Ordering Provider  Baltazar Najjar, MD              methylPREDNISolone sodium succinate (Solu-MEDROL) injection 40 mg       Admin Date  10/17/2023  10:46 Action  Given Dose  40 mg Route  Intravenous Ordering Provider  Baltazar Najjar, MD                    PIV Flushed, +BBR and de-accessed, guaze and coban applied.      Education: Patient/Family educated regarding the expected outcomes/side effects possible interactions of treatments. Patient verbalizes understanding. Pt encouraged to f/u w/ team w/ any new or worsening symptoms. Pt discharged in stable condition w/ visitor. Pt reports slight improvement in her back pain. She  will be picking up her prescription on her way home.     Follow-up Plan: A return to the Infusion Center for the next treatment, has been scheduled for 12/12/23.         Thornell Mule. Eulah Pont, RN  10/17/2023 1:53 PM

## 2023-10-19 LAB — VALPROIC ACID LEVEL, TOTAL AND FREE
Valproic Acid Free: 8.8 mg/L (ref 4.8–17.3)
Valproic Acid Level: 79.3 mg/L (ref 50.0–100.0)

## 2023-10-20 ENCOUNTER — Encounter: Payer: Self-pay | Admitting: Neurology

## 2023-10-20 LAB — LACOSAMIDE: Lacosamide: 10.2 ug/mL

## 2023-10-24 ENCOUNTER — Ambulatory Visit (INDEPENDENT_AMBULATORY_CARE_PROVIDER_SITE_OTHER): Payer: BLUE CROSS/BLUE SHIELD | Admitting: Internal Medicine

## 2023-10-24 ENCOUNTER — Encounter (INDEPENDENT_AMBULATORY_CARE_PROVIDER_SITE_OTHER): Payer: Self-pay | Admitting: Internal Medicine

## 2023-10-24 VITALS — BP 114/74 | HR 106 | Ht 68.0 in | Wt 317.0 lb

## 2023-10-24 DIAGNOSIS — M62838 Other muscle spasm: Secondary | ICD-10-CM

## 2023-10-24 DIAGNOSIS — H209 Unspecified iridocyclitis: Secondary | ICD-10-CM

## 2023-10-24 DIAGNOSIS — Z79899 Other long term (current) drug therapy: Secondary | ICD-10-CM

## 2023-10-24 DIAGNOSIS — M0600 Rheumatoid arthritis without rheumatoid factor, unspecified site: Secondary | ICD-10-CM

## 2023-10-24 DIAGNOSIS — M352 Behcet's disease: Secondary | ICD-10-CM

## 2023-10-24 LAB — INFLIXIMAB LEVEL AND ANTI-DRUG ANTIBODY FOR IBD
Infliximab Antibody: 19 [AU] — ABNORMAL HIGH (ref <10)
Infliximab Level: 6.6 ug/mL

## 2023-10-24 MED ORDER — CYCLOBENZAPRINE HCL 5 MG PO TABS
5.0000 mg | ORAL_TABLET | Freq: Three times a day (TID) | ORAL | 0 refills | Status: DC | PRN
Start: 2023-10-24 — End: 2024-02-26

## 2023-10-24 MED ORDER — FOLIC ACID 1 MG PO TABS
1000.0000 ug | ORAL_TABLET | Freq: Every day | ORAL | 1 refills | Status: AC
Start: 2023-10-24 — End: ?

## 2023-10-24 NOTE — Patient Instructions (Signed)
 Dear Hailey [name] ,     It was lovely to see you in clinic today.    Here are things I'd like you to do following today's visit:  Please have blood taken for labs just prior to your next visit  Medication instructions:  -Please continue taking m

## 2023-10-24 NOTE — Progress Notes (Signed)
 RHEUMATOLOGY FOLLOW UP NOTE        PCP: Virgilio Frees, MD    HPI:    This patient is a 42 y.o. 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

## 2023-10-30 ENCOUNTER — Telehealth: Payer: Self-pay

## 2023-10-30 ENCOUNTER — Encounter (INDEPENDENT_AMBULATORY_CARE_PROVIDER_SITE_OTHER): Payer: Self-pay | Admitting: Family Medicine

## 2023-11-02 ENCOUNTER — Encounter (INDEPENDENT_AMBULATORY_CARE_PROVIDER_SITE_OTHER): Payer: Self-pay | Admitting: Family Medicine

## 2023-11-02 ENCOUNTER — Telehealth: Payer: Self-pay

## 2023-11-02 ENCOUNTER — Telehealth (INDEPENDENT_AMBULATORY_CARE_PROVIDER_SITE_OTHER): Payer: BLUE CROSS/BLUE SHIELD | Admitting: Family Medicine

## 2023-11-02 DIAGNOSIS — M461 Sacroiliitis, not elsewhere classified: Secondary | ICD-10-CM

## 2023-11-02 MED ORDER — METHOCARBAMOL 750 MG PO TABS
750.0000 mg | ORAL_TABLET | Freq: Four times a day (QID) | ORAL | 2 refills | Status: DC
Start: 2023-11-02 — End: 2024-01-02

## 2023-11-02 MED ORDER — HYDROCODONE-ACETAMINOPHEN 5-325 MG PO TABS
1.0000 | ORAL_TABLET | Freq: Four times a day (QID) | ORAL | 0 refills | Status: DC | PRN
Start: 2023-11-02 — End: 2023-12-27

## 2023-11-02 NOTE — Telephone Encounter (Addendum)
 PA renewal due for Qulipta 60mg     Patient has Insurance [employer]    PA Required. Initiated via CMM (Key [id number]). Submitted PA with appt notes. Pending determination.      Case ID [id number]    Effective Dates 11/02/23 to 11/01/24    Approved Qty/Day su

## 2023-11-02 NOTE — Progress Notes (Signed)
 [facility] [place]    Subjective:      Date: 11/02/2023 11:33 AM   Patient ID: Hailey [name] is a 42 y.o. female.    Verbal consent has been obtained from the patient to conduct a video and telephone visit: yes    Chief Complaint:  Ch

## 2023-11-04 ENCOUNTER — Ambulatory Visit: Payer: BLUE CROSS/BLUE SHIELD

## 2023-11-05 ENCOUNTER — Other Ambulatory Visit (INDEPENDENT_AMBULATORY_CARE_PROVIDER_SITE_OTHER): Payer: Self-pay | Admitting: Internal Medicine

## 2023-11-05 DIAGNOSIS — H209 Unspecified iridocyclitis: Secondary | ICD-10-CM

## 2023-11-05 DIAGNOSIS — M0600 Rheumatoid arthritis without rheumatoid factor, unspecified site: Secondary | ICD-10-CM

## 2023-11-05 DIAGNOSIS — Z79899 Other long term (current) drug therapy: Secondary | ICD-10-CM

## 2023-11-05 DIAGNOSIS — M352 Behcet's disease: Secondary | ICD-10-CM

## 2023-11-06 MED ORDER — METHOTREXATE SODIUM 2.5 MG PO TABS
5.0000 mg | ORAL_TABLET | ORAL | 1 refills | Status: AC
Start: 2023-11-06 — End: 2024-04-22

## 2023-11-08 ENCOUNTER — Encounter: Payer: Self-pay | Admitting: Internal Medicine

## 2023-11-08 DIAGNOSIS — M352 Behcet's disease: Secondary | ICD-10-CM

## 2023-11-08 DIAGNOSIS — R569 Unspecified convulsions: Secondary | ICD-10-CM

## 2023-11-08 DIAGNOSIS — Z79899 Other long term (current) drug therapy: Secondary | ICD-10-CM

## 2023-11-08 DIAGNOSIS — M0600 Rheumatoid arthritis without rheumatoid factor, unspecified site: Secondary | ICD-10-CM

## 2023-11-08 DIAGNOSIS — H209 Unspecified iridocyclitis: Secondary | ICD-10-CM

## 2023-11-13 ENCOUNTER — Ambulatory Visit: Payer: BLUE CROSS/BLUE SHIELD | Admitting: Physician Assistant

## 2023-11-16 ENCOUNTER — Other Ambulatory Visit (INDEPENDENT_AMBULATORY_CARE_PROVIDER_SITE_OTHER): Payer: Self-pay | Admitting: Internal Medicine

## 2023-11-16 DIAGNOSIS — M352 Behcet's disease: Secondary | ICD-10-CM

## 2023-11-16 DIAGNOSIS — K120 Recurrent oral aphthae: Secondary | ICD-10-CM

## 2023-11-16 DIAGNOSIS — K760 Fatty (change of) liver, not elsewhere classified: Secondary | ICD-10-CM

## 2023-11-16 DIAGNOSIS — M0600 Rheumatoid arthritis without rheumatoid factor, unspecified site: Secondary | ICD-10-CM

## 2023-11-18 ENCOUNTER — Other Ambulatory Visit (INDEPENDENT_AMBULATORY_CARE_PROVIDER_SITE_OTHER): Payer: Self-pay | Admitting: Family Medicine

## 2023-11-18 DIAGNOSIS — J3089 Other allergic rhinitis: Secondary | ICD-10-CM

## 2023-11-18 DIAGNOSIS — K811 Chronic cholecystitis: Secondary | ICD-10-CM

## 2023-11-27 ENCOUNTER — Other Ambulatory Visit (INDEPENDENT_AMBULATORY_CARE_PROVIDER_SITE_OTHER): Payer: Self-pay | Admitting: Residents

## 2023-11-30 ENCOUNTER — Ambulatory Visit: Payer: BLUE CROSS/BLUE SHIELD | Attending: Neurology

## 2023-11-30 DIAGNOSIS — M352 Behcet's disease: Secondary | ICD-10-CM | POA: Insufficient documentation

## 2023-11-30 DIAGNOSIS — R41840 Attention and concentration deficit: Secondary | ICD-10-CM | POA: Insufficient documentation

## 2023-11-30 DIAGNOSIS — R41841 Cognitive communication deficit: Secondary | ICD-10-CM | POA: Insufficient documentation

## 2023-11-30 DIAGNOSIS — R41844 Frontal lobe and executive function deficit: Secondary | ICD-10-CM | POA: Insufficient documentation

## 2023-11-30 DIAGNOSIS — R413 Other amnesia: Secondary | ICD-10-CM | POA: Insufficient documentation

## 2023-11-30 NOTE — SLP Eval Note (Signed)
 [facility]  [address] [place] [address]  Phone: [phone]      Fax: [phone]    SPEECH THERAPY EVALUATION AND PLAN OF CARE    *I agree to the below stated plan of care*                  Physician Signature      Date    REFERRING MD:  [name] Sammi DEL, MD PhD    PATIENT: Hailey [name] DOB: 1981-08-15   MR #: [id number]  AGE: 43 y.o.    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    Number of visits authorized: FEP, 75 OT/PT/SLP combined, 75 visits remaining      Precautions and Contraindications:   Seizures - Most recent seizure: November 27, 2023         Rescue Plan: No rescue plan, typically come out of it 90 after 90 seconds         At times a sense of deja vu and then unresponsive     Allergies[1]    Medications:  has a current medication list which includes the following prescription(s): atogepant , botox , buspirone , clindamycin , colesevelam , cyclobenzaprine , dapsone, dicyclomine , divalproex  (er) extended release, dorzolamide-timolol, eletriptan , epinephrine , folic acid , hydrocodone -acetaminophen , hydroxyzine , infliximab , lacosamide , lidocaine -prilocaine, melatonin-pyridoxine, methocarbamol , methotrexate , modafinil , multiple vitamin, omeprazole , ondansetron ,  otezla , prednisone , rimegepant sulfate , valacyclovir , cyanocobalamin , and vitamin d , and the following Facility-Administered Medications: onabotulinumtoxin a.    Past Medical/Surgical History:  Medical History[2]  Past Surgical History[3]     Social History:    Pt lives with spouse. 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 Pt enjoys playing the piano, reading, and cooking.       History of Present Illness: [name] is a 43 y.o. female who presents to outpatient SLP evaluation with a diagnosis of Behcet's disease that has resulted in word finding, memory, reading comprehension, attention, planning, and vision deficits. Pt was diagnosed with Behcet's disease in 2019 and was well managing on Humira  and other medications until the beginning of 2023 when she felt she had a status change. At this time, pt reports everything went haywire and deficits noted above began. She reports a history of possible seizures that occur every 2-3 weeks. Seizures have not been seen during EEG. Pt also reports increased fatigue which impacts daily functioning. She reports a lumbar puncture was performed which confirmed the Behcet's disease was affecting brain functioning. 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     Pt is scheduled for a neuropysch evaluation next week. She is followed by neurologist, Dr. [name] who referred her to outpatient SLP therapy.    Prior Level  of Function:    Prior to 2023, pt was working full time as a engineer, maintenance. She was driving. Pt was independently managing schedule and medications using pill box and timers. She was managing fiances with spouse. She reports she had a type A personality and was very organized. Enjoyed cooking.       Current Level of Function:  Pt is no longer driving due to vision impairments and suspected seizures. She is managing  schedule and finds it very difficult, stating everything goes into a shared calendar, but I have trouble recalling what day of the week it is. She continues to assist spouse with the finances, but also finds this to be more challenging. She is managing medications with the assistance of her spouse, using a pillbox, alarms, and the apple health app to help track medications. Pt reports difficulty recalling passwords, providing the example we use a research officer, political party, but I can't remember the password to get into the password keeper. She has difficulty writing and recalling a grocery list, reading, listening to audio books, and planning. Pt is no longer able to cook due to decreased stamina and difficulty reading/recalling recipes and ingredients.         Patient Goal:    Pt reports she would like to learn strategies to decrease frustration when I can't come up with a word or finish a thought.      SUBJECTIVE REPORT:  Patient arrives with spouse.      Pain:    5 on a scale of 0 - 10, location: SI joints      PROMIS Item Bank v2.0 - Cognitive Function - Short Form 8a; Scored on a scale of 1-5 (1- very often/several times a day, 2- often/about once a day, 3- sometimes/two or three times, 4- rarely/once, 5-never)    In the past 7 days....  My thinking has been slow....  1   It has seemed like my brain was not working as well as usual....  3   I have had to work harder than usual to keep track of what I was doing....  3   I have had trouble shifting back and forth between different activities that require thinking....  2   I have had trouble concentrating....  2   I have had to work really hard to pay attention or I would make a mistake....  2   I have had trouble forming thoughts...  1   I have had trouble adding or subtracting numbers in my head....  2   TOTAL: 16/40      OBJECTIVE MEASUREMENTS AND CLINICAL OBSERVATIONS:  The following tests were performed today:  Hopkins Verbal Learning Test (HVLT) and Phonemic  Fluency (FAS)  Results listed below.      Hopkins Verbal Learning Test - Revised (HVLT-R, Form 1; Appendix: A- )  Total Recall: 28 (T= 49; 47%ile) - Average  Will repeat assessment to complete delayed recall and recognition discrimination scores.        Functional Assessment of Verbal Reasoning and Executive Skills "Task 2: Scheduling" was attempted, but pt was unable to complete due to vision difficulties and reading comprehension deficits.         Phonemic Fluency (FAS)  F: 8 words/minute  A: 9 words/minute  S: 4 words/minute  Total: 21 words (%ile; Mean: 44.7 SD: 11.2) - Below Average   Norms compared by age and level of education (Tombaugh et al, 1999).        Language:  A formal language evaluation was not completed this evaluation, but pt has noted below average scores on the phonemic fluency assessment. SLP observed word finding deficits in conversation as pt provided case history and difficulty finishing a thought/sentence. Pt also reports impairments in reading comprehension. Impairments in reading comprehension impeded pt's ability to complete standardized cognitive assessment. Will assess reading and language further during future sessions.        Swallowing:  Pt reports no difficulties swallowing at this time.     Voice:  Breath support:  Diaphragmatic at rest    Vocal quality:  WFL    Pitch:   Within Functional Limits    Volume:  Within Functional Limits      Dysarthria:  WFL - Pt is 100% intelligible to this unfamiliar listener at the level of conversation.        ASSESSMENT: Carlette Palmatier is a 43 y.o. female presents to outpatient therapy today with mild cognitive communication impairments, mild memory impairments, and mild executive function impairments. These deficits impact the pt's ability to complete functional tasks in the home such as cooking, organizing a list, reading complex material, and recalling medical information. Pt warrants skilled SLP intervention to address deficits and  train cognitive linguistic compensatory strategies for increased independence in functional tasks.    The following impairments were noted:  Mild cognitive communication impairment, as characterized by deficits in word finding in conversation, phonemic fluency, comprehension of basic and complex written material, and problem solving.  Mild memory impairment, as characterized by impairments in delayed recall of new information.   Mild executive function and attention impairment, as characterized by impairments in sustained attention, alternating/divided attention, and planning/organizing.     Patient presents with the following functional limitations:  Patient has difficulty with communicating at the level of conversation.  Unable to effectively implement attention / memory strategies for sustained attention, alternating/divided attention, and recall of new information.  Difficulty with executive function skills such as  problem solving and planning/organizing.    Without intervention, patient is at risk for:  Decreased social interactions that may lead to further cognitive decline and contribute to further medical complications and increased fall risk.  Inability to hold current job position and loss of income.  Burden of care.  Depression.    It is medically necessary for Wanda Rock Beck to recieve skilled speech therapy intervention in order to address the above impairments and functional limitations.      Patient Education:  Patient educated on SLP POC. Pt verbalized an understanding of this education, as well as goals and benefits of skilled speech therapy.     Rehabilitation Potential:    Good for patient desire and commitment to program and spouse participation     Treatment Today:  EVALUATION:   Speech-language Evaluation [phone]    PLAN:    Patient/Caregiver Education and Training  [address] Speech / Language Treatment    Frequency of treatment:    1 times per week for 12 weeks.    Short Term Goals:  To be  met by 6 weeks from 11/30/2023  Pt will demonstrate use of attention and executive strategies (self-talk, go slow, double-check, etc.) during complex structured tasks with 90% acc when provided with priming for strategies in order to increase familiarity with attention and executive strategies for use in functional tasks.  Pt will monitor for word choice, verbal organization, relevance and clarity with appropriate self-corrections when provided with no more than 2 cues from clinician in order  to accurately communicate information within structured language tasks (i.e. summarizing written information, answering question, describing event, etc.) in 4/5 opportunities in order to improve efficient and effective communication with spouse, friends, and medical professionals.   Pt will utilize word finding strategies during structured language tasks and conversation in 4/5 opportunities when provided with min cues in order to improve efficient communication with spouse, friends, and medical professionals.   Pt will utilize attentive reading and constrained summarization when reading short paragraphs (3-5 sentences) of material relevant to their interests and daily life (personal letters, news, magazine articles, etc.) and answer questions regarding material with 90% acc when provided with min cues across 2 sessions to improve reading comprehension.     Long Term Goals:  To be met by 12 weeks from 11/30/2023  Pt will utilize executive function and internal/external memory strategies to plan/recall/complete functional and professional tasks when provided with priming for strategy use at least 5/7 days per week, based on pt report.  Pt will report improved cognitive communication skills demonstrated by an increased score on patient reported outcome measure (PROMIS Item Bank v2.0 - Cognitive Function - Short Form 8a) from a baseline score of 16/40.  Pt will demonstrate effective processing speed and complex attention as  evidenced by scoring WNL on all subtests of the Attention Processing Training Test (APT).       It has been a pleasure to evaluate [employer].  Please contact me with any questions or concerns regarding this patient's evaluation or ongoing therapy.    Therapist Signature:    Bernarda Hench M.A., CCC-SLP  Speech Language Pathologist  [facility]  Outpatient Specialty Rehabilitation  [email or web address]             [1]   Allergies  Allergen Reactions    Fruit Blend Flavor [Flavoring Agent (Non-Screening)] Anaphylaxis and Swelling     Tropical Fruit     Latex Hives, Swelling, Other (See Comments) and Drug-Induced Flushing    Adhesive [Wound Dressing Adhesive] Itching    Cyanoacrylate Itching    Other Itching and Rash     Sutures Monocryl    Oxycodone Hallucinations   [2]   Past Medical History:  Diagnosis Date    Abnormal vision     reading glasses    Anxiety     Arrhythmia     WPW ablation in 2005, 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 Released from cardiology    Behcet's disease     She sees Dr. Woodard    BMI 45.0-49.9, adult     Convulsions 01/2023    02/11/23 stable on medical    Diarrhea     Ear, nose and throat disorder     seasonal allergies    Fatigue     Bechet's    Fever 11/09/2022    Headache     migraine    Hx of ovarian cyst     Lower back pain     not severe    Meningitis 2011    Viral // treated and resolved    Pain in wrist 11/09/2022    Post-operative nausea and vomiting     Scoliosis     moderate// used a Brace at a young age    Uveitis     using eyedrops   [3]   Past Surgical History:  Procedure Laterality Date    ABLATION - PVC'S N/A 06/08/2022    Procedure: Ablation - PVC's;  Surgeon: Von Oh, MD;  Location: [facility];  Service: Cardiovascular;  Laterality: N/A;  SAME DAY DISCHARGE  CARTO NO TEE    ARTHROTOMY, WRIST Right 12/03/2020    Procedure: RIGHT UPPER EXTREMITY SYNOVIAL BIOPSY;  Surgeon: Bolling Oh, MD;  Location: [facility];  Service: Plastics;  Laterality: Right;    BIOPSY,  LYMPH NODE N/A 07/23/2018    Procedure: BIOPSY, LYMPH NODE;  Surgeon: Gloris Wilkie FALCON, MD;  Location: [facility];  Service: ENT;  Laterality: N/A;  NECK DEEP LYMPH NODE BIOPSY    BONE MARROW BIOPSY  2020    led to behcet's dx    CARDIAC ABLATION  2005    [facility]  at [facility] for Wolff-Parkinson-White    CHOLECYSTECTOMY  2009    COLONOSCOPY, DIAGNOSTIC (SCREENING) N/A 03/21/2023    Procedure: COLONOSCOPY, DIAGNOSTIC (SCREENING);  Surgeon: Leontine Countryman, MD;  Location: [facility];  Service: Gastroenterology;  Laterality: N/A;    DILATION AND CURETTAGE OF UTERUS  2010    EGD N/A 03/21/2023    Procedure: EGD;  Surgeon: Leontine Countryman, MD;  Location: [facility];  Service: Gastroenterology;  Laterality: N/A;    FINGER GANGLION CYST EXCISION  04/2018    GANGLION CYST EXCISION  2014    04/2018 cyst removed from index finger-[facility]    HYSTERECTOMY  2012    HYSTEROSCOPY  2011    indication: Abnormal bleeding    LAPAROSCOPIC, LYSIS, ADHESIONS N/A 11/09/2022    Procedure: LAPAROSCOPIC LYSIS OF ADHESIONS;  Surgeon: Jan, Ambareen G, MD;  Location: [facility];  Service: Gynecology;  Laterality: N/A;    LAPAROSCOPIC, SALPINGECTOMY Left 11/09/2022    Procedure: LAPAROSCOPIC, SALPINGECTOMY LEFT;  Surgeon: Madison Sedalia MATSU, MD;  Location: [facility];  Service: Gynecology;  Laterality: Left;    LAPAROSCOPY, DIAGNOSTIC N/A 11/09/2022    Procedure: LAPAROSCOPY, DIAGNOSTIC;  Surgeon: Madison Sedalia MATSU, MD;  Location: [facility];  Service: Gynecology;  Laterality: N/A;  [id number]    UMBILICAL HERNIA REPAIR  2016    UPPER GASTROINTESTINAL ENDOSCOPY  2009    UPPER GASTROINTESTINAL ENDOSCOPY  03/2023    WISDOM TOOTH EXTRACTION  1998

## 2023-12-04 ENCOUNTER — Telehealth: Payer: Self-pay

## 2023-12-04 NOTE — Telephone Encounter (Signed)
Submitted PA for Modafinil 200MG  tablets via [email or web address]

## 2023-12-06 NOTE — Progress Notes (Signed)
 Procedures:    Sacroiliac L SI Joint      Performed by: Cherly Beach, MD  Authorized by: Cherly Beach, MD      Consent Given by:  Patient  Timeout: prior to procedure the correct patient, procedure, and site was verified    Preparation:  Skin prepped with alcohol and skin prepped with Betadine  Procedure:  Sacroiliac  Indications:  Diagnostic evaluation and therapeutic benefit  Guidance:  Fluoroscopy  Site(s):  L SI Joint  Site(s):     Additional Procedural Details:  The patient was given the opportunity to ask questions regarding the procedure, its indications and associated risks. The risks of the procedure discussed including infection, bleeding, allergic reaction, headache, nerve injuries, and cardiovascular and CNS side effects with possible vascular entry of medications.  In addition, possible side effects or reactions to the medications potentially used during the procedure were discussed with the patient. The patient was informed both verbally and in writing. The patient understood the informed consent and desired to have the procedure performed.     After consent was obtained, the patient was placed in the prone position on the treatment table.  A "time out" was performed. The skin overlying the SI joint were prepped and drapped in usual fashion.The SI joint was visualized via a AP fluoroscopic view. The local tissue was then injected with Lidocaine 1%. A 22 gauge spinal needle was advanced until it penetrated the posterior joint capsule. 0.5 cc of contrast was injected and an arthorgram was confirmed in AP and lateral views. Then a solution of 3 cc of 1% lidocaine and 40 mg of methylprednisolone was injected in small boluses.  The needle was removed and pressure was applied to the injection site to decrease the incidence of ecchymosis and hematoma formation.  A sterile bandage was applied.    Vitals were monitored throughout the procedure.  After the procedure they were taken back to recovery  where their vitals were again monitored. They were discharged in stable condition.       Medication Administered: 1 mL depo-medrol 40 MG/ML; 1 mL iohexol 240 MG/ML; 7 mL lidocaine 1 %    Needle Size:  22 G  Needle Length:  3.5 in  PreservativeFree    Pt Tolerated Procedure:  Well and no immediate complications

## 2023-12-07 ENCOUNTER — Ambulatory Visit: Payer: BLUE CROSS/BLUE SHIELD

## 2023-12-07 ENCOUNTER — Encounter: Payer: Self-pay | Admitting: Neurology

## 2023-12-07 ENCOUNTER — Encounter: Payer: Self-pay | Admitting: Internal Medicine

## 2023-12-07 DIAGNOSIS — R41844 Frontal lobe and executive function deficit: Secondary | ICD-10-CM

## 2023-12-07 DIAGNOSIS — R413 Other amnesia: Secondary | ICD-10-CM

## 2023-12-07 DIAGNOSIS — R41841 Cognitive communication deficit: Secondary | ICD-10-CM

## 2023-12-07 DIAGNOSIS — R41840 Attention and concentration deficit: Secondary | ICD-10-CM

## 2023-12-07 NOTE — Progress Notes (Signed)
 [facility]  [address]  [place], [place]  [address]  Phone:  [phone]  Fax:  [phone]    SPEECH THERAPY DAILY TREATMENT NOTE    PATIENT: Hailey [name] DOB: Apr 03, 1981   MR #: [id number]  AGE: 43 y.o.    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];Memory loss [R41.3]  TREATMENT DIAGNOSIS: Executive function deficit R41.844 Memory Deficits R41.3 Attention Deficits R41.840 Cognitive-Communication Deficit R41.841     Date of Service SLP Received On: 12/07/23   Treatment Time Start Time: 1100 to Stop Time: 1200   Time Calculation Time Calculation (min): 60 min   Visit # SLP Visit Number: 2   Units Billed   SLP Treatments  $ SLP Treatment Indiv [phone]): 1 Procedure     Medications:  has a current medication list which includes the following prescription(s): atogepant , botulinum toxin type a , buspirone , clindamycin , colesevelam , cyclobenzaprine , dapsone, dicyclomine , divalproex  (er) extended release, dorzolamide-timolol, eletriptan , epinephrine , folic acid , hydrocodone -acetaminophen , hydroxyzine , infliximab , lacosamide , lidocaine -prilocaine, melatonin-pyridoxine, methocarbamol , methotrexate , modafinil , multiple vitamin, omeprazole , ondansetron , otezla , prednisone , rimegepant sulfate , valacyclovir , cyanocobalamin , and vitamin d , and the following Facility-Administered Medications: onabotulinumtoxin a.  Patient/Caregiver does not report changes to medication at this time.    Precautions:   Seizures - Most recent seizure: November 27, 2023                    Rescue Plan: No rescue plan, typically come out of it 90 after 90 seconds                    At times a sense of deja vu and then unresponsive     Allergies:  Allergies[1]    SUBJECTIVE REPORT:  Patient presents for speech therapy  today alone.     Patient reports she had the neuropysch evaluation on Tuesday and she was exhausted falling the appointment. She reports SI injections went well and she is pain free today.     Pain:    Patient denies pain today.    OBJECTIVE FINDINGS:  Interventions / Goal Status:  Goals copied from last visit:    Short Term Goals:  To be met by 6 weeks from 11/30/2023  Pt will demonstrate use of attention and executive strategies (self-talk, go slow, double-check, etc.) during complex structured tasks with 90% acc when provided with priming for strategies in order to increase familiarity with attention and executive strategies for use in functional tasks.  Pt will monitor for word choice, verbal organization, relevance and clarity with appropriate self-corrections when provided with no more than 2 cues from clinician in order to accurately communicate information within structured language tasks (i.e. summarizing written information, answering question, describing event, etc.) in 4/5 opportunities in order to improve efficient and effective communication with spouse, friends, and medical professionals.   Pt will utilize word finding strategies during structured language tasks and conversation in 4/5 opportunities when provided with min cues in order to improve efficient communication with spouse, friends, and medical professionals.   Pt will utilize attentive reading and constrained summarization when reading short paragraphs (3-5 sentences) of material relevant to their interests and daily life (personal letters, news, magazine articles, etc.) and answer questions regarding material with 90% acc when provided with min cues across 2 sessions  to improve reading comprehension.      Long Term Goals:  To be met by 12 weeks from 11/30/2023  Pt will utilize executive function and internal/external memory strategies to plan/recall/complete functional and professional tasks when provided with priming for strategy use at  least 5/7 days per week, based on pt report.  Pt will report improved cognitive communication skills demonstrated by an increased score on patient reported outcome measure (PROMIS Item Bank v2.0 - Cognitive Function - Short Form 8a) from a baseline score of 16/40.  Pt will demonstrate effective processing speed and complex attention as evidenced by scoring WNL on all subtests of the Attention Processing Training Test (APT).      GOAL # INTERVENTIONS PROGRESS   STG 1 Cognitive Pacing Scale EMERGING:     Cognitive Pacing Scale introduced.    STG 2  NOT ADDRESSED TODAY.   STG 3  NOT ADDRESSED TODAY.   STG 4  NOT ADDRESSED TODAY.        LTG 1  NOT ADDRESSED TODAY.   LTG 2  NOT ADDRESSED TODAY.   LTG 3  NOT ADDRESSED TODAY.     Education/Home Exercise Program:    Patient was educated on strategies and techniques used in treatment session.  Cognitive Pacing Scale and Energy Pie introduced.    Response to Treatment today:   Cognitive Pacing Scale and Energy Pie introduced via handout and verbal explanation. Pt receptive to use. Pt has background knowledge of pacing using the spoon theory. Goals reviewed with pt.      ASSESSMENT: Dominica Kent is a 43 y.o. female presents to outpatient therapy today with mild cognitive communication impairments, mild memory impairments, and mild executive function impairments. These deficits impact the pt's ability to complete functional tasks in the home such as cooking, organizing a list, reading complex material, and recalling medical information. Pt warrants skilled SLP intervention to address deficits and train cognitive linguistic compensatory strategies for increased independence in functional tasks.     The following impairments were noted:  Mild cognitive communication impairment, as characterized by deficits in word finding in conversation, phonemic fluency, comprehension of basic and complex written material, and problem solving.  Mild memory impairment, as  characterized by impairments in delayed recall of new information.   Mild executive function and attention impairment, as characterized by impairments in sustained attention, alternating/divided attention, and planning/organizing.      Patient presents with the following functional limitations:  Patient has difficulty with communicating at the level of conversation.  Unable to effectively implement attention / memory strategies for sustained attention, alternating/divided attention, and recall of new information.  Difficulty with executive function skills such as  problem solving and planning/organizing.     Without intervention, patient is at risk for:  Decreased social interactions that may lead to further cognitive decline and contribute to further medical complications and increased fall risk.  Inability to hold current job position and loss of income.  Burden of care.  Depression.    Patient is progressing well toward above mentioned functional goals.    PLAN:   It is medically necessary for Wanda Rock Beck to recieve skilled speech therapy intervention in order to address the above impairments and functional limitations.    Recommend continue per plan of care to work toward increased cognitive and communication abilities for safety and independence with daily activities and in the community.     NEXT VISIT: Focus on APT, review cognitive pacing, and introduce attention strategies  Therapist Signature:    Bernarda Hench M.A., CCC-SLP  Speech Language Pathologist  [facility]  Outpatient Specialty Rehabilitation  [email or web address] [email or web address]    12/07/2023         [1]   Allergies  Allergen Reactions    Fruit Blend Flavor [Flavoring Agent (Non-Screening)] Anaphylaxis and Swelling     Tropical Fruit     Latex Hives, Swelling, Other (See Comments) and Drug-Induced Flushing    Adhesive [Wound Dressing Adhesive] Itching    Cyanoacrylate Itching    Other Itching and Rash     Sutures Monocryl    Oxycodone  Hallucinations

## 2023-12-12 ENCOUNTER — Ambulatory Visit: Payer: BLUE CROSS/BLUE SHIELD | Attending: Anatomic and Clinical Pathology

## 2023-12-12 ENCOUNTER — Other Ambulatory Visit (INDEPENDENT_AMBULATORY_CARE_PROVIDER_SITE_OTHER): Payer: BLUE CROSS/BLUE SHIELD

## 2023-12-12 VITALS — BP 134/93 | HR 79 | Temp 97.7°F | Wt 320.1 lb

## 2023-12-12 DIAGNOSIS — L659 Nonscarring hair loss, unspecified: Secondary | ICD-10-CM

## 2023-12-12 DIAGNOSIS — Z79899 Other long term (current) drug therapy: Secondary | ICD-10-CM

## 2023-12-12 DIAGNOSIS — M0600 Rheumatoid arthritis without rheumatoid factor, unspecified site: Secondary | ICD-10-CM | POA: Insufficient documentation

## 2023-12-12 DIAGNOSIS — M352 Behcet's disease: Secondary | ICD-10-CM | POA: Insufficient documentation

## 2023-12-12 DIAGNOSIS — R569 Unspecified convulsions: Secondary | ICD-10-CM | POA: Insufficient documentation

## 2023-12-12 DIAGNOSIS — H209 Unspecified iridocyclitis: Secondary | ICD-10-CM | POA: Insufficient documentation

## 2023-12-12 DIAGNOSIS — D84821 Immunodeficiency due to drugs: Secondary | ICD-10-CM | POA: Insufficient documentation

## 2023-12-12 LAB — COMPREHENSIVE METABOLIC PANEL
ALT: 74 U/L — ABNORMAL HIGH (ref <=55)
AST (SGOT): 45 U/L — ABNORMAL HIGH (ref <=41)
Albumin/Globulin Ratio: 1.1 (ref 0.9–2.2)
Albumin: 4 g/dL (ref 3.5–5.0)
Alkaline Phosphatase: 48 U/L (ref 37–117)
Anion Gap: 10 (ref 5.0–15.0)
BUN: 13 mg/dL (ref 7–21)
Bilirubin, Total: 0.5 mg/dL (ref 0.2–1.2)
CO2: 27 meq/L (ref 17–29)
Calcium: 9.9 mg/dL (ref 8.5–10.5)
Chloride: 102 meq/L (ref 99–111)
Creatinine: 0.8 mg/dL (ref 0.4–1.0)
GFR: >60 mL/min/{1.73_m2} (ref >=60.0)
Globulin: 3.7 g/dL — ABNORMAL HIGH (ref 2.0–3.6)
Glucose: 82 mg/dL (ref 70–100)
Hemolysis Index: 13 {index}
Potassium: 3.9 meq/L (ref 3.5–5.3)
Protein, Total: 7.7 g/dL (ref 6.0–8.3)
Sodium: 139 meq/L (ref 135–145)

## 2023-12-12 LAB — LAB USE ONLY - CBC WITH DIFFERENTIAL
Absolute Basophils: 0.05 10*3/uL (ref 0.00–0.08)
Absolute Eosinophils: 0.05 10*3/uL (ref 0.00–0.44)
Absolute Immature Granulocytes: 0.15 10*3/uL — ABNORMAL HIGH (ref 0.00–0.07)
Absolute Lymphocytes: 4.95 10*3/uL — ABNORMAL HIGH (ref 0.42–3.22)
Absolute Monocytes: 1.03 10*3/uL — ABNORMAL HIGH (ref 0.21–0.85)
Absolute Neutrophils: 8.05 10*3/uL — ABNORMAL HIGH (ref 1.10–6.33)
Absolute nRBC: 0 10*3/uL (ref <=0.00)
Basophils %: 0.4 %
Eosinophils %: 0.4 %
Hematocrit: 45.9 % — ABNORMAL HIGH (ref 34.7–43.7)
Hemoglobin: 15.5 g/dL — ABNORMAL HIGH (ref 11.4–14.8)
Immature Granulocytes %: 1.1 %
Lymphocytes %: 34.7 %
MCH: 33.5 pg (ref 25.1–33.5)
MCHC: 33.8 g/dL (ref 31.5–35.8)
MCV: 99.1 fL — ABNORMAL HIGH (ref 78.0–96.0)
MPV: 8.4 fL — ABNORMAL LOW (ref 8.9–12.5)
Monocytes %: 7.2 %
Neutrophils %: 56.2 %
Platelet Count: 325 10*3/uL (ref 142–346)
Preliminary Absolute Neutrophil Count: 8.05 10*3/uL — ABNORMAL HIGH (ref 1.10–6.33)
RBC: 4.63 10*6/uL (ref 3.90–5.10)
RDW: 12 % (ref 11–15)
WBC: 14.28 10*3/uL — ABNORMAL HIGH (ref 3.10–9.50)
nRBC %: 0 /100{WBCs} (ref <=0.0)

## 2023-12-12 LAB — C-REACTIVE PROTEIN: C-Reactive Protein: <0.1 mg/dL (ref 0.0–1.1)

## 2023-12-12 LAB — VITAMIN B12: Vitamin B-12: 1652 pg/mL — ABNORMAL HIGH (ref 211–911)

## 2023-12-12 LAB — IRON PROFILE
Iron Saturation: 45 % (ref 15–50)
Iron: 177 ug/dL — ABNORMAL HIGH (ref 32–157)
TIBC: 395 ug/dL (ref 265–497)
UIBC: 218 ug/dL (ref 126–382)

## 2023-12-12 LAB — FOLATE
Folate: 15.5 ng/mL (ref >5.4)
Hemolysis Index: 2 {index}

## 2023-12-12 LAB — T4, FREE: T4 Free: 1.05 ng/dL (ref 0.69–1.48)

## 2023-12-12 LAB — SEDIMENTATION RATE: Sed Rate: 5 mm/h (ref <=20)

## 2023-12-12 LAB — TSH: TSH: 3.65 u[IU]/mL (ref 0.35–4.94)

## 2023-12-12 LAB — VITAMIN D, 25 OH, TOTAL: Vitamin D 25-OH, Total: 39 ng/mL (ref 30–100)

## 2023-12-12 LAB — FERRITIN: Ferritin: 276.9 ng/mL — ABNORMAL HIGH (ref 4.60–204.00)

## 2023-12-12 MED ORDER — SODIUM CHLORIDE 0.9 % IV SOLN
700.0000 mg | Freq: Once | INTRAVENOUS | Status: AC
Start: 2023-12-12 — End: 2023-12-12
  Administered 2023-12-12: 700 mg via INTRAVENOUS
  Filled 2023-12-12: qty 700

## 2023-12-12 MED ORDER — DIPHENHYDRAMINE HCL 50 MG/ML IJ SOLN
25.0000 mg | Freq: Once | INTRAMUSCULAR | Status: DC | PRN
Start: 2023-12-12 — End: 2023-12-12

## 2023-12-12 MED ORDER — SODIUM CHLORIDE 0.9 % IV BOLUS
500.0000 mL | Freq: Once | INTRAVENOUS | Status: DC | PRN
Start: 2023-12-12 — End: 2023-12-12

## 2023-12-12 MED ORDER — EPINEPHRINE 1 MG/ML ANAPHYLAXIS KIT IM USE
0.3000 mg | Freq: Once | INTRAMUSCULAR | Status: DC | PRN
Start: 2023-12-12 — End: 2023-12-12

## 2023-12-12 MED ORDER — FAMOTIDINE 10 MG/ML IV SOLN (WRAP)
20.0000 mg | Freq: Once | INTRAVENOUS | Status: DC | PRN
Start: 2023-12-12 — End: 2023-12-12

## 2023-12-12 MED ORDER — ALBUTEROL SULFATE (2.5 MG/3ML) 0.083% IN NEBU
2.5000 mg | INHALATION_SOLUTION | Freq: Once | RESPIRATORY_TRACT | Status: DC | PRN
Start: 2023-12-12 — End: 2023-12-12

## 2023-12-12 MED ORDER — METHYLPREDNISOLONE SODIUM SUCC 40 MG IJ SOLR (WRAP)
40.0000 mg | Freq: Once | INTRAMUSCULAR | Status: AC
Start: 2023-12-12 — End: 2023-12-12
  Administered 2023-12-12: 40 mg via INTRAVENOUS
  Filled 2023-12-12: qty 1

## 2023-12-12 MED ORDER — METHYLPREDNISOLONE SODIUM SUCC 40 MG IJ SOLR (WRAP)
40.0000 mg | Freq: Once | INTRAMUSCULAR | Status: DC | PRN
Start: 2023-12-12 — End: 2023-12-12

## 2023-12-12 MED ORDER — DIPHENHYDRAMINE HCL 50 MG/ML IJ SOLN
25.0000 mg | Freq: Once | INTRAMUSCULAR | Status: AC
Start: 2023-12-12 — End: 2023-12-12
  Administered 2023-12-12: 25 mg via INTRAVENOUS
  Filled 2023-12-12: qty 1

## 2023-12-12 MED ORDER — ACETAMINOPHEN 325 MG PO TABS
650.0000 mg | ORAL_TABLET | Freq: Once | ORAL | Status: AC
Start: 2023-12-12 — End: 2023-12-12
  Administered 2023-12-12: 650 mg via ORAL
  Filled 2023-12-12: qty 2

## 2023-12-12 MED ORDER — DIPHENHYDRAMINE HCL 50 MG/ML IJ SOLN
50.0000 mg | Freq: Once | INTRAMUSCULAR | Status: DC | PRN
Start: 2023-12-12 — End: 2023-12-12

## 2023-12-12 MED ORDER — SODIUM CHLORIDE (PF) 0.9 % IJ SOLN
5.0000 mL | INTRAMUSCULAR | Status: DC | PRN
Start: 2023-12-12 — End: 2023-12-12

## 2023-12-12 NOTE — Progress Notes (Deleted)
 [facility] - Adult Infusion  Visit Date: 12/12/2023    Hailey [name] is a 43 y.o. female patient.    Patient presents for infliximab .  She has been doing well. Appetite is intact. her WBC is 14.28, ANC 8.05. she denies S/S of infection. She has dry cough for couple of weeks. reports a little bit headache and ongoing dizziness today. her HR is 102. she said that she hydrate well but not today. Reports ongoing swelling on left ankle. Denies SOB, rash, chest pain and diarrhea.  it's ok to treat with high WBC counts and encourage her to hydrate well at home per Dr.McBride.       Vital Signs:  Patient Vitals for the past 12 hrs:   BP Temp Pulse   12/12/23 1553 118/83 -- 90   12/12/23 1453 (!) 132/91 -- (!) 101   12/12/23 1452 129/84 -- (!) 101   12/12/23 1429 134/90 -- (!) 102   12/12/23 1414 (!) 188/136 97.7 F (36.5 C) (!) 109          *** administered in ***. Bandage applied to site.    Education: Patient/Family educated regarding the expected outcomes/side effects possible interactions of {Chemo Education:[phone]}. {[facility] ONCOLOGY EDUCATION [phone]::Patient verbalizes understanding.}    Follow-up Plan: {Follow [phone]}    RTC ***    Dorma Dee, RN  12/12/2023 4:46 PM

## 2023-12-12 NOTE — Progress Notes (Signed)
 Hailey [name]   Visit Date: 12/12/2023        Hailey [name] is a 43 y.o. female patient. Since her last visit, she has been doing well.   [name] .   Overall, she states her energy level is good. The patient is able to perform most usual functions. Appetite is intact. her WBC is 14.28, ANC 8.05. she denies S/S of infection. She has dry cough for couple of weeks. reports a little bit headache and ongoing dizziness today. her HR is 102. she said that she hydrate well but not today. Reports ongoing swelling on left ankle. Denies SOB, rash, chest pain and diarrhea.  it's ok to treat with high WBC counts and encourage her to hydrate well at home per Dr.McBride.   She has no other concerns. There are no social work or other referral needs at this time.  Educated patient to call Dr.McBride if she has any signs and symptoms of infection.    IV Pre Hydration: N/A  Pre-Medications: acetaminophen  (Tylenol ), diphenhydramine  (Benadryl ), and hydrocortisone IV (Solu-Cortef)    Line Type: Peripheral IV:  Right arm; intact and clean w/ good blood return.  Blood Return verified prior to administration: Yes      Vital Signs:  Patient Vitals for the past 12 hrs:   BP Temp Pulse   12/12/23 1800 (!) 134/93 -- 79   12/12/23 1553 118/83 -- 90   12/12/23 1453 (!) 132/91 -- (!) 101   12/12/23 1452 129/84 -- (!) 101   12/12/23 1429 134/90 -- (!) 102   12/12/23 1414 (!) 188/136 97.7 F (36.5 C) (!) 109      Body surface area is 2.64 meters squared.    '  Chemistry        Component Value Date/Time    NA 139 12/12/2023 1254    NA 139 03/06/2023 1355    K 3.9 12/12/2023 1254    K 4.0 03/06/2023 1355    CL 102 12/12/2023 1254    CL 108 03/06/2023 1355    CO2 27 12/12/2023 1254    CO2 25 03/06/2023 1355    BUN 13 12/12/2023 1254    BUN 8.0 03/06/2023 1355    CREAT 0.8 12/12/2023 1254    CREAT 0.7 03/06/2023 1355    CREAT 0.72 07/25/2022 1133    GLU 82  12/12/2023 1254    GLU 105 (H) 03/06/2023 1355        Component Value Date/Time    CA 9.9 12/12/2023 1254    CA 9.6 03/06/2023 1355    ALKPHOS 48 12/12/2023 1254    ALKPHOS 60 03/06/2023 1355    AST 45 (H) 12/12/2023 1254    AST 30 03/06/2023 1355    ALT 74 (H) 12/12/2023 1254    ALT 39 03/06/2023 1355    BILITOTAL 0.5 12/12/2023 1254    BILITOTAL 0.3 03/06/2023 1355            Lab Results   Component Value Date    WBC 14.28 (H) 12/12/2023    HGB 15.5 (H) 12/12/2023    PLT 325 12/12/2023    NEUTROABS 8.05 (H) 12/12/2023       Administrations This Visit       acetaminophen  (TYLENOL ) tablet 650 mg       Admin Date  12/12/2023  15:27 Action  Given Dose  650 mg Route  Oral Ordering Provider  Woodard,  Geni HERO, MD              diphenhydrAMINE  (BENADRYL ) injection 25 mg       Admin Date  12/12/2023  15:28 Action  Given Dose  25 mg Route  Intravenous Ordering Provider  Woodard Geni HERO, MD              inFLIXimab -axxq (AVSOLA ) 700 mg in sodium chloride  0.9 % 250 mL [name]       Admin Date  12/12/2023  15:53 Action  New Bag Dose  700 mg Route  Intravenous Ordering Provider  Woodard Geni HERO, MD              methylPREDNISolone  sodium succinate (Solu-MEDROL ) injection 40 mg       Admin Date  12/12/2023  15:28 Action  Given Dose  40 mg Route  Intravenous Ordering Provider  Woodard Geni HERO, MD                  Infliximab  administered over 2hours.    Hypersensitivity Reaction Noted: No  IV Post Hydration: No, not required  Treatment Outcome:Patient tolerated treatment well.  Blood return verified after administration: Yes  PIV on left arm flushed; intact and clean w/ good blood return. and deacessed. Applied guaze and coban.    Education: Patient/Family educated regarding the expected outcomes/side effects possible interactions of treatments and medications. Patient verbalizes understanding.    Follow-up Plan: Discharged in stable condition. Accompanied by, spouse. Instructed to contact physician if any complications or  unmanageable symptoms occur.    A [facility] to [name] for the next treatment, has been requested for 4/1.      Grayson White, RN, RN    12/12/2023  6:07 PM

## 2023-12-14 ENCOUNTER — Ambulatory Visit: Payer: BLUE CROSS/BLUE SHIELD | Attending: Neurology

## 2023-12-14 DIAGNOSIS — R41841 Cognitive communication deficit: Secondary | ICD-10-CM | POA: Insufficient documentation

## 2023-12-14 DIAGNOSIS — R41844 Frontal lobe and executive function deficit: Secondary | ICD-10-CM | POA: Insufficient documentation

## 2023-12-14 DIAGNOSIS — R41840 Attention and concentration deficit: Secondary | ICD-10-CM | POA: Insufficient documentation

## 2023-12-14 DIAGNOSIS — R413 Other amnesia: Secondary | ICD-10-CM | POA: Insufficient documentation

## 2023-12-14 DIAGNOSIS — M352 Behcet's disease: Secondary | ICD-10-CM | POA: Insufficient documentation

## 2023-12-14 LAB — SEX HORMONE BINDING GLOBULIN: Sex Hormone-Binding Globulin (SHBG): 64 nmol/L (ref 17–124)

## 2023-12-14 LAB — VALPROIC ACID LEVEL, TOTAL AND FREE
Valproic Acid Free: 8.3 mg/L (ref 4.8–17.3)
Valproic Acid Level: 82.5 mg/L (ref 50.0–100.0)

## 2023-12-14 LAB — DEHYDROEPIANDROSTERONE (DHEA) SULFATE: DHEA-Sulfate: 5 ug/dL — ABNORMAL LOW (ref 19–231)

## 2023-12-14 NOTE — Progress Notes (Signed)
 [facility]  [address], [address]  [place], [place]  [address]  Phone:  [phone]  Fax:  [phone]    SPEECH THERAPY DAILY TREATMENT NOTE    PATIENT: Hailey [name] DOB: 12-05-1980   MR #: [id number]  AGE: 43 y.o.    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];Memory loss [R41.3]  TREATMENT DIAGNOSIS: Executive function deficit R41.844 Memory Deficits R41.3 Attention Deficits R41.840 Cognitive-Communication Deficit R41.841     Date of Service SLP Received On: 12/14/23   Treatment Time Start Time: 1100 to Stop Time: 1200   Time Calculation Time Calculation (min): 60 min   Visit # SLP Visit Number: 3   Units Billed   SLP Treatments  $ SLP Treatment Indiv [phone]): 1 Procedure     Medications:  has a current medication list which includes the following prescription(s): atogepant , botulinum toxin type a , buspirone , clindamycin , colesevelam , cyclobenzaprine , dapsone, dicyclomine , divalproex  (er) extended release, dorzolamide-timolol, eletriptan , epinephrine , folic acid , hydrocodone -acetaminophen , hydroxyzine , infliximab , lacosamide , lidocaine -prilocaine, melatonin-pyridoxine, methocarbamol , methotrexate , modafinil , multiple vitamin, omeprazole , ondansetron , otezla , prednisone , rimegepant sulfate , valacyclovir , cyanocobalamin , and vitamin d , and the following Facility-Administered Medications: onabotulinumtoxin a.  Patient/Caregiver does not report changes to medication at this time.    Precautions:   Seizures - Most recent seizure: November 27, 2023                    Rescue Plan: No rescue plan, typically come out of it 90 after 90 seconds                    At times a sense of deja vu and then unresponsive     Allergies:  Allergies[1]    SUBJECTIVE REPORT:  Patient presents for speech therapy  today alone.     Patient reports she infusion on Tuesday and it was a very long day. Pt reports she utilized cognitive pacing scale over the last week.     Pain:    Patient denies pain today.    OBJECTIVE FINDINGS:  Interventions / Goal Status:  Goals copied from last visit:    Short Term Goals:  To be met by 6 weeks from 11/30/2023  Pt will demonstrate use of attention and executive strategies (self-talk, go slow, double-check, etc.) during complex structured tasks with 90% acc when provided with priming for strategies in order to increase familiarity with attention and executive strategies for use in functional tasks.  Pt will monitor for word choice, verbal organization, relevance and clarity with appropriate self-corrections when provided with no more than 2 cues from clinician in order to accurately communicate information within structured language tasks (i.e. summarizing written information, answering question, describing event, etc.) in 4/5 opportunities in order to improve efficient and effective communication with spouse, friends, and medical professionals.   Pt will utilize word finding strategies during structured language tasks and conversation in 4/5 opportunities when provided with min cues in order to improve efficient communication with spouse, friends, and medical professionals.   Pt will utilize attentive reading and constrained summarization when reading short paragraphs (3-5 sentences) of material relevant to their interests and daily life (personal letters, news, magazine articles, etc.) and answer questions regarding material with 90% acc when provided with min cues across 2 sessions to improve reading comprehension.  Long Term Goals:  To be met by 12 weeks from 11/30/2023  Pt will utilize executive function and internal/external memory strategies to plan/recall/complete functional and professional tasks when provided with priming for strategy use at least 5/7 days per week, based on pt  report.  Pt will report improved cognitive communication skills demonstrated by an increased score on patient reported outcome measure (PROMIS Item Bank v2.0 - Cognitive Function - Short Form 8a) from a baseline score of 16/40.  Pt will demonstrate effective processing speed and complex attention as evidenced by scoring WNL on all subtests of the Attention Processing Training Test (APT).      GOAL # INTERVENTIONS PROGRESS   STG 1 Cognitive Pacing Scale EMERGING:     Cognitive Pacing Scale reviewed; pt reports in the yellow zone.   STG 2  NOT ADDRESSED TODAY.   STG 3  NOT ADDRESSED TODAY.   STG 4  NOT ADDRESSED TODAY.        LTG 1  NOT ADDRESSED TODAY.   LTG 2  NOT ADDRESSED TODAY.   LTG 3  MET.    Attention Process Training Test  Sustained Attention: 26/30 (normative mean: 29.1 +/- 0.4)  Complex Sustained Attention: 18/30 (normative mean: 19.5 +/- 5.4)  Selective Attention: 14/30 (normative mean: 22.3 +/- 6.6)  Divided Attention: 13/30 (normative mean: 27.6 +/- 5.7)  Alternating Attention: 6/24 (normative mean: 16.8 +/- 3.9)     Education/Home Exercise Program:    Patient was educated on strategies and techniques used in treatment session.  Cognitive Pacing Scale and Energy Pie introduced.    Response to Treatment today:   Cognitive Pacing Scale reviewed. The Attention Process Training Test was completed, see results below;    Attention Process Training Test  Sustained Attention: 26/30 (normative mean: 29.1 +/- 0.4)  Complex Sustained Attention: 18/30 (normative mean: 19.5 +/- 5.4)  Selective Attention: 14/30 (normative mean: 22.3 +/- 6.6)  Divided Attention: 13/30 (normative mean: 27.6 +/- 5.7)  Alternating Attention: 6/24 (normative mean: 16.8 +/- 3.9)    ASSESSMENT: Hailey [name] is a 43 y.o. female presents to outpatient therapy today with mild cognitive communication impairments, mild memory impairments, and mild executive function impairments. These deficits impact the pt's ability to complete  functional tasks in the home such as cooking, organizing a list, reading complex material, and recalling medical information. Pt warrants skilled SLP intervention to address deficits and train cognitive linguistic compensatory strategies for increased independence in functional tasks.     The following impairments were noted:  Mild cognitive communication impairment, as characterized by deficits in word finding in conversation, phonemic fluency, comprehension of basic and complex written material, and problem solving.  Mild memory impairment, as characterized by impairments in delayed recall of new information.   Mild executive function and attention impairment, as characterized by impairments in sustained attention, alternating/divided attention, and planning/organizing.      Patient presents with the following functional limitations:  Patient has difficulty with communicating at the level of conversation.  Unable to effectively implement attention / memory strategies for sustained attention, alternating/divided attention, and recall of new information.  Difficulty with executive function skills such as  problem solving and planning/organizing.     Without intervention, patient is at risk for:  Decreased social interactions that may lead to further cognitive decline and contribute to further medical complications and increased fall risk.  Inability to hold current job position and loss of income.  Burden of care.  Depression.    Patient is  progressing well toward above mentioned functional goals.    PLAN:   It is medically necessary for Hailey [name] to recieve skilled speech therapy intervention in order to address the above impairments and functional limitations.    Recommend continue per plan of care to work toward increased cognitive and communication abilities for safety and independence with daily activities and in the community.     NEXT VISIT: Focus on review cognitive pacing and introduce  attention strategies    Therapist Signature:    Bernarda Hench M.A., CCC-SLP  Speech Language Pathologist  [facility]  Outpatient Specialty Rehabilitation  [name]    12/14/2023         [1]   Allergies  Allergen Reactions    Fruit Blend Flavor [Flavoring Agent (Non-Screening)] Anaphylaxis and Swelling     Tropical Fruit     Latex Hives, Swelling, Other (See Comments) and Drug-Induced Flushing    Adhesive [Wound Dressing Adhesive] Itching    Cyanoacrylate Itching    Other Itching and Rash     Sutures Monocryl    Oxycodone Hallucinations

## 2023-12-15 LAB — TESTOSTERONE FREE AND TOTAL
Free Testosterone: 2.2 pg/mL (ref 0.1–6.4)
Total Testosterone: 25 ng/dL (ref 2–45)

## 2023-12-17 ENCOUNTER — Other Ambulatory Visit: Payer: Self-pay | Admitting: Neurology

## 2023-12-17 LAB — INFLIXIMAB LEVEL AND ANTI-DRUG ANTIBODY FOR IBD
Infliximab Antibody: 13 [AU] — ABNORMAL HIGH (ref <10)
Infliximab Level: 7 ug/mL

## 2023-12-18 ENCOUNTER — Ambulatory Visit: Payer: BLUE CROSS/BLUE SHIELD | Admitting: Neurology

## 2023-12-19 LAB — ANDROSTENEDIONE: Androstenedione: 58 ng/dL

## 2023-12-21 ENCOUNTER — Other Ambulatory Visit: Payer: Self-pay | Admitting: Neurology

## 2023-12-21 ENCOUNTER — Ambulatory Visit: Payer: BLUE CROSS/BLUE SHIELD

## 2023-12-21 DIAGNOSIS — R413 Other amnesia: Secondary | ICD-10-CM

## 2023-12-21 DIAGNOSIS — R41844 Frontal lobe and executive function deficit: Secondary | ICD-10-CM

## 2023-12-21 DIAGNOSIS — R41841 Cognitive communication deficit: Secondary | ICD-10-CM

## 2023-12-21 DIAGNOSIS — R41840 Attention and concentration deficit: Secondary | ICD-10-CM

## 2023-12-21 NOTE — Progress Notes (Signed)
 [facility]  [address], [address]  [place], [place]  [address]  Phone:  [phone]  Fax:  [phone]    SPEECH THERAPY DAILY TREATMENT NOTE    PATIENT: Hailey [name] DOB: 04/21/1981   MR #: [id number]  AGE: 43 y.o.    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];Memory loss [R41.3]  TREATMENT DIAGNOSIS: Executive function deficit R41.844 Memory Deficits R41.3 Attention Deficits R41.840 Cognitive-Communication Deficit R41.841     Date of Service SLP Received On: 12/21/23   Treatment Time Start Time: 1100 to Stop Time: 1200   Time Calculation Time Calculation (min): 60 min   Visit # SLP Visit Number: 4   Units Billed   SLP Treatments  $ SLP Treatment Indiv [phone]): 1 Procedure     Medications:  has a current medication list which includes the following prescription(s): atogepant , botulinum toxin type a , buspirone , clindamycin , colesevelam , cyclobenzaprine , dapsone, dicyclomine , divalproex  (er) extended release, dorzolamide-timolol, eletriptan , epinephrine , folic acid , hydrocodone -acetaminophen , hydroxyzine , infliximab , lacosamide , lidocaine -prilocaine, melatonin-pyridoxine, methocarbamol , methotrexate , modafinil , multiple vitamin, omeprazole , ondansetron , otezla , prednisone , rimegepant sulfate , valacyclovir , cyanocobalamin , and vitamin d , and the following Facility-Administered Medications: onabotulinumtoxin a.  Patient/Caregiver does not report changes to medication at this time.    Precautions:   Seizures - Most recent seizure: December 16, 2023                    Rescue Plan: No rescue plan, typically come out of it 90 after 90 seconds                    At times a sense of deja vu and then unresponsive     Allergies:  Allergies[1]    SUBJECTIVE REPORT:  Patient presents for speech therapy  today alone.     Patient reports she hit the dark red zone, Friday night last week, after watching a movie.    Pain:    Patient denies pain today.    OBJECTIVE FINDINGS:  Interventions / Goal Status:  Goals copied from last visit:    Short Term Goals:  To be met by 6 weeks from 11/30/2023  Pt will demonstrate use of attention and executive strategies (self-talk, go slow, double-check, etc.) during complex structured tasks with 90% acc when provided with priming for strategies in order to increase familiarity with attention and executive strategies for use in functional tasks.  Pt will monitor for word choice, verbal organization, relevance and clarity with appropriate self-corrections when provided with no more than 2 cues from clinician in order to accurately communicate information within structured language tasks (i.e. summarizing written information, answering question, describing event, etc.) in 4/5 opportunities in order to improve efficient and effective communication with spouse, friends, and medical professionals.   Pt will utilize word finding strategies during structured language tasks and conversation in 4/5 opportunities when provided with min cues in order to improve efficient communication with spouse, friends, and medical professionals.   Pt will utilize attentive reading and constrained summarization when reading short paragraphs (3-5 sentences) of material relevant to their interests and daily life (personal letters, news, magazine articles, etc.) and answer questions regarding material with 90% acc when provided with min cues across 2 sessions to improve reading comprehension.      Long Term Goals:  To be met by 12 weeks from 11/30/2023  Pt will utilize executive function and internal/external memory strategies to plan/recall/complete functional and professional tasks when provided with priming for strategy use at least 5/7 days per week, based on pt report.  Pt will report improved cognitive  communication skills demonstrated by an increased score on patient reported outcome measure (PROMIS Item Bank v2.0 - Cognitive Function - Short Form 8a) from a baseline score of 16/40.  Pt will demonstrate effective processing speed and complex attention as evidenced by scoring WNL on all subtests of the Attention Processing Training Test (APT).      GOAL # INTERVENTIONS PROGRESS   STG 1 Cognitive Pacing Scale EMERGING:     Cognitive Pacing Scale reviewed.    Attention strategies introduced.    STG 2  NOT ADDRESSED TODAY.   STG 3  NOT ADDRESSED TODAY.   STG 4  NOT ADDRESSED TODAY.        LTG 1  NOT ADDRESSED TODAY.   LTG 2  NOT ADDRESSED TODAY.   LTG 3  MET.    Attention Process Training Test  Sustained Attention: 26/30 (normative mean: 29.1 +/- 0.4)  Complex Sustained Attention: 18/30 (normative mean: 19.5 +/- 5.4)  Selective Attention: 14/30 (normative mean: 22.3 +/- 6.6)  Divided Attention: 13/30 (normative mean: 27.6 +/- 5.7)  Alternating Attention: 6/24 (normative mean: 16.8 +/- 3.9)     Education/Home Exercise Program:    Patient was educated on strategies and techniques used in treatment session.  Attention strategies introduced.     Response to Treatment today:   Attention strategies introduced via handout and verbal explanation. Pt receptive to use of attention strategies. Will review how they were utilized in functional tasks next session.     ASSESSMENT: Hailey [name] is a 43 y.o. female presents to outpatient therapy today with mild cognitive communication impairments, mild memory impairments, and mild executive function impairments. These deficits impact the pt's ability to complete functional tasks in the home such as cooking, organizing a list, reading complex material, and recalling medical information. Pt warrants skilled SLP intervention to address deficits and train cognitive linguistic compensatory strategies for increased independence in functional tasks.     The following impairments  were noted:  Mild cognitive communication impairment, as characterized by deficits in word finding in conversation, phonemic fluency, comprehension of basic and complex written material, and problem solving.  Mild memory impairment, as characterized by impairments in delayed recall of new information.   Mild executive function and attention impairment, as characterized by impairments in sustained attention, alternating/divided attention, and planning/organizing.      Patient presents with the following functional limitations:  Patient has difficulty with communicating at the level of conversation.  Unable to effectively implement attention / memory strategies for sustained attention, alternating/divided attention, and recall of new information.  Difficulty with executive function skills such as  problem solving and planning/organizing.     Without intervention, patient is at risk for:  Decreased social interactions that may lead to further cognitive decline and contribute to further medical complications and increased fall risk.  Inability to hold current job position and loss of income.  Burden of care.  Depression.    Patient is progressing well toward above mentioned functional goals.    PLAN:   It is medically necessary for Hailey [name] to recieve skilled speech therapy intervention in order to address the above impairments and functional limitations.    Recommend continue per plan of care  to work toward increased cognitive and communication abilities for safety and independence with daily activities and in the community.     NEXT VISIT: Focus on review cognitive pacing and attention strategies    Therapist Signature:    Bernarda Hench M.A., CCC-SLP  Speech Language Pathologist  [facility]  Outpatient [facility]  [email or web address]    12/21/2023         [1]   Allergies  Allergen Reactions    Fruit Blend Flavor [Flavoring Agent (Non-Screening)] Anaphylaxis and Swelling      Tropical Fruit     Latex Hives, Swelling, Other (See Comments) and Drug-Induced Flushing    Adhesive [Wound Dressing Adhesive] Itching    Cyanoacrylate Itching    Other Itching and Rash     Sutures Monocryl    Oxycodone Hallucinations

## 2023-12-22 ENCOUNTER — Encounter: Payer: Self-pay | Admitting: Internal Medicine

## 2023-12-25 ENCOUNTER — Encounter: Payer: Self-pay | Admitting: Physician Assistant

## 2023-12-25 ENCOUNTER — Ambulatory Visit: Payer: BLUE CROSS/BLUE SHIELD | Attending: Neurology | Admitting: Physician Assistant

## 2023-12-25 VITALS — BP 132/79 | HR 102 | Temp 98.1°F | Resp 16 | Ht 68.0 in

## 2023-12-25 DIAGNOSIS — Z9229 Personal history of other drug therapy: Secondary | ICD-10-CM

## 2023-12-25 DIAGNOSIS — G43709 Chronic migraine without aura, not intractable, without status migrainosus: Secondary | ICD-10-CM

## 2023-12-25 DIAGNOSIS — R569 Unspecified convulsions: Secondary | ICD-10-CM

## 2023-12-25 MED ORDER — ONABOTULINUMTOXINA 100 UNITS IJ SOLR
200.0000 [IU] | Freq: Once | INTRAMUSCULAR | Status: AC
Start: 2023-12-25 — End: 2023-12-25
  Administered 2023-12-25: 200 [IU] via INTRAMUSCULAR
  Filled 2023-12-25: qty 200

## 2023-12-25 NOTE — Progress Notes (Signed)
 12/25/2023      CC: migraine    History was obtained from patient   History of Present Illness:  Hailey [name] is a 43 y.o. 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     Feb 2025:  She presents for Botox  injections    She tolerates Botox  well and finds it helpful for chronic migraine management. Wearing off around  10 weeks. There was a big snow storm on the date of her usual Botox  treatment and therefore there she is about 6 weeks behind.   Because of the delay, she has had an increased frequency of headache.     No ER visits or other concerns since previous visit with Dr. Norvell    She is using atogepant  60mg , Nurtec and eletrtipan for her migraine management.     Oct 2024:   She presents for Botox  injections    She is using atogepant  60mg , Nurtec and eletrtipan for her migraine management.     She sees Dr. [name] and has a 48 hours VEEG scheduled.      Jan 2024:   She presents for Botox  injections. Headaches remain wel controlled. She reports only 2 breakthrough headache days in January.       Notes reviewed: Admission for viral illness in Jan 2024  Last injections Sep 12, 2022.     Mar 15 2022  Headache hx: since college  Location:bilateral and band like and left eye  Described as sharp and throbbing  Occurs:>15 HA days per month  Duration:>4 hours  Intensity: moderate to severe  Aura:none  Associated with photo/phonophobia, nausea and dizziness  Denies focal motor/sensory or visual changes.   No positional or autonomic symptoms  Exacerbating factors:unknown  Alleviating factors:unknown  Sleep:OK  Mood:high, working in coping mechanisms    Past medications tried:  Nurtec  Emgality   Rizatriptan   imitrex   buspar ,   Topiramate: SE  reglan ,   Propranolol ineffective  hydroxyzine         Past Medical History:  Past Medical History:   Diagnosis Date    Abnormal vision     reading glasses    Anxiety     Arrhythmia     WPW ablation in 2005, 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 Released from  cardiology    Behcet's disease     She sees Dr. Woodard    BMI 45.0-49.9, adult     Convulsions 01/2023    02/11/23 stable on medical    Diarrhea     Ear, nose and throat disorder     seasonal allergies    Fatigue     Bechet's    Fever 11/09/2022    Headache     migraine    Hx of ovarian cyst     Lower back pain     not severe    Meningitis 2011    Viral // treated and resolved    Pain in wrist 11/09/2022    Post-operative nausea and vomiting     Scoliosis     moderate// used a Brace at a young age    Uveitis     using eyedrops       Past Surgical History:  Past Surgical History:   Procedure Laterality Date    ABLATION - PVC'S N/A 06/08/2022    Procedure: Ablation - PVC's;  Surgeon: Von Oh, MD;  Location: [facility];  Service: Cardiovascular;  Laterality: N/A;  SAME DAY DISCHARGE  CARTO NO TEE  ARTHROTOMY, WRIST Right 12/03/2020    Procedure: RIGHT UPPER EXTREMITY SYNOVIAL BIOPSY;  Surgeon: Bolling Oh, MD;  Location: [facility];  Service: Plastics;  Laterality: Right;    BIOPSY, LYMPH NODE N/A 07/23/2018    Procedure: BIOPSY, LYMPH NODE;  Surgeon: Gloris Wilkie FALCON, MD;  [facility]: [facility];  Service: ENT;  Laterality: N/A;  NECK DEEP LYMPH NODE BIOPSY    BONE MARROW BIOPSY  2020    led to behcet's dx    CARDIAC ABLATION  2005    [facility]  at [facility] for Wolff-[facility]    CHOLECYSTECTOMY  2009    COLONOSCOPY, DIAGNOSTIC (SCREENING) N/A 03/21/2023    Procedure: COLONOSCOPY, DIAGNOSTIC (SCREENING);  Surgeon: Leontine Countryman, MD;  Location: [facility];  Service: Gastroenterology;  Laterality: N/A;    DILATION AND CURETTAGE OF UTERUS  2010    EGD N/A 03/21/2023    Procedure: EGD;  Surgeon: Leontine Countryman, MD;  Location: [facility];  Service: Gastroenterology;  Laterality: N/A;    FINGER GANGLION CYST EXCISION  04/2018    GANGLION CYST EXCISION  2014    04/2018 cyst removed from index finger-[facility]    HYSTERECTOMY  2012    HYSTEROSCOPY  2011     indication: Abnormal bleeding    LAPAROSCOPIC, LYSIS, ADHESIONS N/A 11/09/2022    Procedure: LAPAROSCOPIC LYSIS OF ADHESIONS;  Surgeon: Jan, Ambareen G, MD;  Location: [facility];  Service: Gynecology;  Laterality: N/A;    LAPAROSCOPIC, SALPINGECTOMY Left 11/09/2022    Procedure: LAPAROSCOPIC, SALPINGECTOMY LEFT;  Surgeon: Madison Sedalia MATSU, MD;  [facility];  Service: Gynecology;  Laterality: Left;    LAPAROSCOPY, DIAGNOSTIC N/A 11/09/2022    Procedure: LAPAROSCOPY, DIAGNOSTIC;  Surgeon: Madison Sedalia MATSU, MD;  Location:  [facility];  Service: Gynecology;  Laterality: N/A;  [id number]    UMBILICAL HERNIA REPAIR  2016    UPPER GASTROINTESTINAL ENDOSCOPY  2009    UPPER GASTROINTESTINAL ENDOSCOPY  03/2023    WISDOM TOOTH EXTRACTION  1998       Allergies:  Fruit blend flavor [flavoring agent (non-screening)], Latex, Adhesive [wound dressing adhesive], Cyanoacrylate, Other, and Oxycodone    Family History:  Family History   Adopted: Yes     No other family history related to current complaints.    Social History:  Social History     Tobacco Use    Smoking status: Never     Passive exposure: Never    Smokeless tobacco: Never   Vaping Use    Vaping status: Never Used   Substance Use Topics    Alcohol use: Never    Drug use: Never       Medications:  Current Outpatient Medications   Medication Sig Dispense Refill    Atogepant  60 MG Tab TAKE 1 TABLET (60 MG) BY MOUTH NIGHTLY FOR MIGRAINE PREVENTION 30 each 11    botulinum toxin type A  (BOTOX ) 200 units injection INJECT 155 UNITS INTRAMUSCULARLY EVERY 12 WEEKS 1 each 3    busPIRone  (BUSPAR ) 5 MG tablet Take 1 tablet (5 mg) by mouth 2 (two) times daily 180 tablet 3    clindamycin  (CLEOCIN  T) 1 % lotion Apply topically every morning      colesevelam  (WELCHOL ) 625 MG tablet TAKE 2 TABLETS (1,250 MG) BY MOUTH EVERY MORNING 180 tablet 3    cyclobenzaprine  (FLEXERIL ) 5 MG tablet Take 1 tablet (5 mg) by mouth 3 (three) times daily as needed for Muscle spasms (for muscle  spasms)  30 tablet 0    Dapsone 7.5 % Gel Apply topically every morning      dicyclomine  (BENTYL ) 20 MG tablet TAKE 1 TABLET (20 MG) BY MOUTH EVERY 6 (SIX) HOURS 360 tablet 3    divalproex , ER, extended release (DEPAKOTE  ER) 500 MG 24 hr tablet Take 2 tablets (1,000 mg) by mouth 2 (two) times daily 360 tablet 3    dorzolamide-timolol (COSOPT) 2-0.5 % ophthalmic solution       eletriptan  (RELPAX ) 40 MG tablet Take 1 tablet (40 mg) by mouth as needed for Migraine .  Wait at least 2 hours prior to taking second dose.  Max 2 tabs/24 hours. Do not use more than 3 days per week. 12 tablet 5    EPINEPHrine  (EPIPEN  2-PAK) 0.3 MG/0.3ML Solution Auto-injector injection Inject 0.3 mLs (0.3 mg) into the muscle once as needed (Anaphylaxis) 2 each 2    folic acid  (FOLVITE ) 1 MG tablet Take 1 tablet (1,000 mcg) by mouth daily 90 tablet 1    HYDROcodone -acetaminophen  (NORCO) 5-325 MG per tablet Take 1-2 tablets by mouth every 6 (six) hours as needed for Pain 20 tablet 0    hydrOXYzine  (ATARAX ) 10 MG tablet TAKE 1 TABLET BY MOUTH EVERY DAY NIGHTLY 90 tablet 3    inFLIXimab  100 MG injection Infuse into the vein once every eight weeks      Lacosamide  (VIMPAT ) 200 MG Tab TAKE 1 TABLET (200 MG) BY MOUTH 2 (TWO) TIMES DAILY 180 tablet 0    lidocaine -prilocaine (EMLA) cream Apply topically as needed Dermatology procedures      Melatonin-Pyridoxine (MELATIN PO) Take 4 mg by mouth nightly      methocarbamol  (ROBAXIN ) 750 MG tablet Take 1 tablet (750 mg) by mouth 4 (four) times daily 30 tablet 2    methotrexate  2.5 MG tablet Take 2 tablets (5 mg) by mouth once a week 24 tablet 1    modafinil  (PROVIGIL ) 200 MG tablet Take 1 tablet (200 mg) by mouth every morning 30 tablet 5    Multiple Vitamin (MULTIVITAMIN PO) Take by mouth daily      omeprazole  (PriLOSEC) 40 MG capsule TAKE 1 CAPSULE BY MOUTH EVERY DAY 90 capsule 1    ondansetron  (ZOFRAN -ODT) 4 MG disintegrating tablet Take 1 tablet (4 mg) by mouth every 8 (eight) hours as needed for Nausea  30 tablet 2    Otezla  30 MG Tab TAKE 1 TABLET BY MOUTH 2 TIMES A DAY 180 tablet 1    predniSONE  (DELTASONE ) 10 MG tablet TAKE 1 TABLET (10 MG) BY MOUTH DAILY. 30 tablet 5    Rimegepant Sulfate  75 MG Tablet Dispersible TAKE 1 TABLET (75 MG) BY MOUTH AS NEEDED (MIGRAINE) 16 each 11    valACYclovir  (VALTREX ) 1000 MG tablet Take 2 tabs at onset; repeat once in 12 hours.  Total 4 tabs per outbreak 12 tablet 3    vitamin B-12 1000 MCG tablet Take 1 tablet (1,000 mcg) by mouth daily 30 tablet 0    VITAMIN D  PO Take 1 tablet by mouth daily Dosage unsure       Current Facility-Administered Medications   Medication Dose Route Frequency Provider Last Rate Last Admin    onabotulinumtoxin A (BOTOX ) injection 175 Units  175 Units Intramuscular Q12 Weeks Kastl, Charlotte M, MD   175 Units at 08/16/23 1013       General Exam:  BP 132/79 (BP Site: Left arm, Patient Position: Sitting, Cuff Size: Large)   Pulse (S) (!) 102   Temp  98.1 F (36.7 C) (Oral)   Resp 16   Ht 1.727 m (5' 8)   LMP  (LMP Unknown)   SpO2 96%   BMI 48.67 kg/m   Respiratory rate wnl.   Gen:  Well-developed, well-nourished.  No acute distress.  Cooperative with exam.  HEENT:  NC/AT. No icterus or conjunctival hemorrhage noted.  Neck: Full range of motion.        NEUROLOGICAL EXAM    Mental Status: Awake, alert, oriented. Speech fluent without dysarthria. Follows commands well. Attention, memory, and concentration intact. Fund of knowledge appropriate for education.     Cranial Nerves:   Extraocular movements are full. No nystagmus seen. Facial sensation to light touch appears intact. Face is symmetric. Hearing is intact to conversational speech. Cranial nerves II-XII otherwise appears intact.     Motor: Moves all extremities well    Coordination:  No truncal ataxia.    Gait: Normal and steady.      Investigations:      Imaging:  MRI Brain W WO Contrast    Result Date: 04/30/2021  Impression:  1. No intracranial abnormality is detected. MR imaging of the  brain is stable with 08/13/2020. 2. There is no lesion within the spinal cord. 3. Mild cervical, thoracic and lumbar spondylosis as discussed above. Electronically signed by: Redell Row D.O.  [Interpreted at: [facility] BG: 04/30/21    MRI Cervical Spine W WO Contrast    Result Date: 04/30/2021  Impression:  1. No intracranial abnormality is detected. MR imaging of the brain is stable with 08/13/2020. 2. There is no lesion within the spinal cord. 3. Mild cervical, thoracic and lumbar spondylosis as discussed above. Electronically signed by: Redell Row D.O.  [Interpreted at: [facility] BG: 04/30/21    MRI Lumbar Spine W WO Contrast    Result Date: 04/30/2021  Impression:  1. No intracranial abnormality is detected. MR imaging of the brain is stable with 08/13/2020. 2. There is no lesion within the spinal cord. 3. Mild cervical, thoracic and lumbar spondylosis as discussed above. Electronically signed by: Redell Row D.O.  [Interpreted at: [facility] BG: 04/30/21    MRI Thoracic Spine W WO Contrast    Result Date: 04/30/2021  Impression:  1. No intracranial abnormality is detected. MR imaging of the brain is stable with 08/13/2020. 2. There is no lesion within the spinal cord. 3. Mild cervical, thoracic and lumbar spondylosis as discussed above. Electronically signed by: Redell Row D.O.  [Interpreted at: [facility] BG: 04/30/21          Procedure: Chemodeneveration.   Indication: Chronic migraine     Discussed Botox  procedure, answered questions, consent is in the chart.    One  vial of 200 units of Botox  was diluted,  with 2 ml of 0.9% NS. Using a 30 gauge half-inch needle, the following muscles were injected with the following doses:   Bilateral corrugator: 10 units divided in 2 sites  Procerus: 5 units in 1 site  Frontalis: 20 units divided in 4 sites  Temporalis: 40 units divided  in 8 sites   Occipitalis: 30 units divided in 6 sites  Cervical paraspinals: 20 units divided in 4 sites  Trapezius: 30 units divided in 6 sites   Masseter 20 units divided in 2 sites  A total of 175 units of botulinum toxin A was administered, divided in 31 sites.   There was 45 units  waste.   The patient tolerated procedure well and No other adverse effects immediately noted.  LOT [id number]  Exp 2027/03    Marcelline Angle, PA-C    Patient reports signing paper consent form prior to rooming. Verbal consent obtaining in addition.      Recommendations:  There are no diagnoses linked to this encounter.        Hailey [name] is a 43 y.o. 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     Tolerated Botox  well today     Plan:   Repeat Botox  in 12 weeks    Continue Nurtec 75mg , Atogepant  and Eletriptan         Referring (see communications section)      The patient should seek emergent care if there is any change in the symptoms. Proper use and all side effects of medications discussed    Discussed the importance of sleep hygiene, maintaining appropriate hydration, avoid overuse of caffeine and OTC medications for headache lifestyle modification      Please contact me with any questions. Patients and [place] Providers can reach me via MyChart.      Dr. Norvell was available in a supervisory capacity for this encounter.       Marcelline Angle RICHARDSON, PA-C  Physician Assistant, Movement Disorders Specialist  [facility]  [facility]     [address] [place]                  [address]  [place] [id number]                                      [place], [place] [address]  T [phone]  F [phone]                 T [phone]  F [phone]      [email or web address]       *DISCLAIMER: This note was generated by the Epic EMR system/ Dragon speech recognition and may contain inherent errors or omissions not intended by  the user. Grammatical errors, random word insertions, deletions, pronoun errors and incomplete sentences are occasional consequences of this technology due to software limitations. Not all errors are caught or corrected. If there are questions or concerns about the content of this note or information contained within the body of this dictation they should be addressed directly with the author for clarification.*

## 2023-12-27 ENCOUNTER — Ambulatory Visit: Payer: BLUE CROSS/BLUE SHIELD | Attending: Neurology | Admitting: Neurology

## 2023-12-27 ENCOUNTER — Encounter: Payer: Self-pay | Admitting: Internal Medicine

## 2023-12-27 ENCOUNTER — Telehealth (INDEPENDENT_AMBULATORY_CARE_PROVIDER_SITE_OTHER): Payer: BLUE CROSS/BLUE SHIELD | Admitting: Physician Assistant

## 2023-12-27 ENCOUNTER — Encounter: Payer: Self-pay | Admitting: Neurology

## 2023-12-27 ENCOUNTER — Telehealth (INDEPENDENT_AMBULATORY_CARE_PROVIDER_SITE_OTHER): Payer: BLUE CROSS/BLUE SHIELD | Admitting: Residents

## 2023-12-27 ENCOUNTER — Encounter (INDEPENDENT_AMBULATORY_CARE_PROVIDER_SITE_OTHER): Payer: Self-pay | Admitting: Physician Assistant

## 2023-12-27 VITALS — BP 136/69 | HR 102 | Resp 18 | Ht 68.0 in

## 2023-12-27 VITALS — Ht 68.0 in | Wt 315.0 lb

## 2023-12-27 DIAGNOSIS — M352 Behcet's disease: Secondary | ICD-10-CM

## 2023-12-27 DIAGNOSIS — R569 Unspecified convulsions: Secondary | ICD-10-CM

## 2023-12-27 DIAGNOSIS — D84821 Immunodeficiency due to drugs: Secondary | ICD-10-CM

## 2023-12-27 DIAGNOSIS — H209 Unspecified iridocyclitis: Secondary | ICD-10-CM

## 2023-12-27 DIAGNOSIS — R194 Change in bowel habit: Secondary | ICD-10-CM

## 2023-12-27 DIAGNOSIS — M0600 Rheumatoid arthritis without rheumatoid factor, unspecified site: Secondary | ICD-10-CM

## 2023-12-27 MED ORDER — DIVALPROEX SODIUM ER 500 MG PO TB24
500.0000 mg | ORAL_TABLET | Freq: Three times a day (TID) | ORAL | 3 refills | Status: AC
Start: 2023-12-27 — End: ?

## 2023-12-27 MED ORDER — ATOMOXETINE HCL 25 MG PO CAPS
ORAL_CAPSULE | ORAL | 5 refills | Status: DC
Start: 2023-12-27 — End: 2024-02-26

## 2023-12-27 NOTE — Progress Notes (Signed)
 Andrea Lights, PA-C  [facility]   [address]   [place], [place] [address]   Phone [phone] Fax [phone]      Date Time: 12/27/2023 3:25 PM   Patient Name: Hailey [name], Hailey [name]     Reason for Consultation:   Follow-up    History:   Verbal consent has been obtained from the patient to conduct a telemedicine visit.    Taydem Deniz Eskridge is a 43 y.o. 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  She previously was seen 10/03/2023 with Dr. Nigam for same complaints.  She was advised to try Benefiber twice daily, MiraLAX, Tums, Gas-X, famotidine , omeprazole  40 mg.  Symptoms have been controlled on this regimen.      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 Path: gastric bx nl, colonoscopy bx nl    -Denies any symptoms of abdominal pain or bloating, early satiety, nausea, vomiting, hematemesis, fever, or chills, loss of appetite or unintentional weight loss.  No upper GI complaints of acid reflux, heartburn, dysphagia or odynophagia.     - FH: No known family history of esophageal, stomach, colon cancer or other gastrointestinal cancers   Past Medical History:     Past Medical History:   Diagnosis Date    Abnormal vision     reading glasses    Anxiety     Arrhythmia     WPW ablation in 2005, 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 Released from cardiology    Behcet's disease     She sees Dr. Woodard    BMI 45.0-49.9, adult     Convulsions 01/2023    02/11/23 stable on medical    Diarrhea      Ear, nose and throat disorder     seasonal allergies    Fatigue     Bechet's    Fever 11/09/2022    Headache     migraine    Hx of ovarian cyst     Lower back pain     not severe    Meningitis 2011    Viral // treated and resolved    Pain in wrist 11/09/2022    Post-operative nausea and vomiting     Scoliosis     moderate// used a Brace at a young age    Uveitis     using eyedrops       Past Surgical History:   Past Surgical History[1]    Family History:   Family History[2]    Social History:   Social History[3]    Allergies:   Allergies[4]    Medications:   Current Medications[5]      Review of Systems:   ROS per HPI    Physical Exam:     Vitals:    12/27/23 1319   Weight: (!) 142.9 kg (315 lb)   Height: 1.727 m (5' 8)     General appearance - Well developed and well nourished. Normal gait. No acute  distress.   HEENT- Normocephalic. Eomi. Sclera anicteric. Normal hearing. Nares normal.  Oropharynx normal.  Neck - Supple.     Abdomen - No abdominal distension.   Musculoskeletal - normal range of motion of arms and legs.  Extremities - no cyanosis   Skin - normal color   Neurologic - Alert and oriented to person, place and time.  No focal motor or sensory deficits. Recent and remote memory normal.     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 EGD/colonoscopy 03/2023 nl     Continue current regimen as has been effective:  - Benefiber powder twice daily   - Miralax powder twice daily  - Tums-GasX or famotidine  20 to 40 mg as rescue medications if needed  - Omeprazole  40 mg 30 min before breakfast  - Small volume frequent meals daily  - Reconsideration of colonoscopy for CRC screening in 03/2028 (5-year repeat), given immunosupp, adopted w/o known fam hx.   - Dicyclomine  as needed    - RTC 6 months     All  recent labs, radiologic studies, and procedure results were reviewed at the time of the visit.      Total time for today's visit is 25 mins.  It is a pleasure to participate in the care of Christy L.Sulkowski. If you have any questions or concerns, please feel free to contact us  at [phone].      Signed by: Andrea CHRISTELLA Lights, PA      This note was generated by the Epic EMR system/Dragon speech recognition and may contain inherent errors or omissions not intended by the user. Grammatical errors, random word insertions, deletions and pronoun errors  are occasional consequences of this technology due to software limitations. Not all errors are caught or corrected. If there are questions or concerns about the content of this note or information contained within the body of this dictation they should be addressed directly with the author for clarification.          [1]   Past Surgical History:  Procedure Laterality Date    ABLATION - PVC'S N/A 06/08/2022    Procedure: Ablation - PVC's;  Surgeon: Von Oh, MD;  Location: [facility];  Service: Cardiovascular;  Laterality: N/A;  SAME DAY DISCHARGE  CARTO NO TEE    ARTHROTOMY, WRIST Right 12/03/2020    Procedure: RIGHT UPPER EXTREMITY SYNOVIAL BIOPSY;  Surgeon: Bolling Oh, MD;  Location: [facility];  Service: Plastics;  Laterality: Right;    BIOPSY, LYMPH NODE N/A 07/23/2018    Procedure: BIOPSY, LYMPH NODE;  Surgeon: Gloris Wilkie FALCON, MD;  Location: [facility];  Service: ENT;  Laterality: N/A;  NECK DEEP LYMPH NODE BIOPSY    BONE MARROW BIOPSY  2020    led to behcet's dx    CARDIAC ABLATION  2005    [facility]  at [facility] for Wolff-Parkinson-White    CHOLECYSTECTOMY  2009    COLONOSCOPY, DIAGNOSTIC (SCREENING) N/A 03/21/2023    Procedure: COLONOSCOPY, DIAGNOSTIC (SCREENING);  Surgeon: Leontine Countryman, MD;  Location: [facility];  Service: Gastroenterology;  Laterality: N/A;    DILATION AND CURETTAGE OF UTERUS  2010    EGD N/A 03/21/2023     Procedure: EGD;  Surgeon: Leontine Countryman, MD;  Location: [facility];  Service: Gastroenterology;  Laterality: N/A;    FINGER GANGLION CYST EXCISION  04/2018    GANGLION CYST EXCISION  2014    04/2018 cyst [facility] index finger[facility]    HYSTERECTOMY  2012    HYSTEROSCOPY  2011    indication: Abnormal bleeding    LAPAROSCOPIC, LYSIS, ADHESIONS N/A 11/09/2022    Procedure: LAPAROSCOPIC LYSIS OF ADHESIONS;  Surgeon: Jan, Ambareen G, MD;  Location: [facility];  Service: Gynecology;  Laterality: N/A;    LAPAROSCOPIC, SALPINGECTOMY Left 11/09/2022    Procedure: LAPAROSCOPIC, SALPINGECTOMY LEFT;  Surgeon: Madison Sedalia MATSU, MD;  Location: [facility];  Service: Gynecology;  Laterality: Left;    LAPAROSCOPY, DIAGNOSTIC N/A 11/09/2022    Procedure: LAPAROSCOPY, DIAGNOSTIC;  Surgeon: Madison Sedalia MATSU, MD;  Location: [facility];  Service: Gynecology;  Laterality: N/A;  [id number]    UMBILICAL HERNIA REPAIR  2016    UPPER GASTROINTESTINAL ENDOSCOPY  2009    UPPER GASTROINTESTINAL ENDOSCOPY  03/2023    WISDOM TOOTH EXTRACTION  1998   [2]   Family History  Adopted: Yes   [3]   Social History  Socioeconomic History    Marital status: Married    Number of children: 0   Occupational History    Occupation: Pensions Consultant    Tobacco Use    Smoking status: Never     Passive exposure: Never    Smokeless tobacco: Never   Vaping Use    Vaping status: Never Used   Substance and Sexual Activity    Alcohol use: Never    Drug use: Never    Sexual activity: Yes     Partners: Male     Birth control/protection: None     Comment: Hysterectomy   Social History Narrative    Diet: Non specific         Caffeine per day: tea 1 cup         ACD: N        Medical POA: N        DNR: N        Exercise: N     Social Drivers of Psychologist, Prison And Probation Services Strain: Low Risk  (05/11/2023)    Overall Financial Resource Strain (CARDIA)     Difficulty of Paying Living Expenses: Not hard at all   Food Insecurity: No Food Insecurity (05/11/2023)     Hunger Vital Sign     Worried About Running Out of Food in the Last Year: Never true     Ran Out of Food in the Last Year: Never true   Transportation Needs: No Transportation Needs (05/11/2023)    PRAPARE - Therapist, Art (Medical): No     Lack of Transportation (Non-Medical): No   Physical Activity: Unknown (05/11/2023)    Exercise Vital Sign     Days of Exercise per Week: Patient declined     Minutes of Exercise per Session: 0 min   Recent Concern: Physical Activity - Insufficiently Active (03/18/2023)    Exercise Vital Sign     Days of Exercise per Week: 1 day     Minutes of Exercise per Session: 10 min   Stress: Stress Concern Present (05/11/2023)    [employer] of Occupational Health - Occupational Stress Questionnaire     Feeling of Stress : Rather much   Social Connections: Moderately Integrated (05/11/2023)    Social Connection and Isolation Panel [NHANES]     Frequency of Communication  with Friends and Family: More than three times a week     Frequency of Social Gatherings with Friends and Family: Once a week     Attends Religious Services: More than 4 times per year     Active Member of [employer] or Organizations: No     Attends Banker Meetings: Never     Marital Status: Married   Catering Manager Violence: Not At Risk (05/11/2023)    Humiliation, Afraid, Rape, and Kick questionnaire     Fear of Current or Ex-Partner: No     Emotionally Abused: No     Physically Abused: No     Sexually Abused: No   Housing Stability: Low Risk  (05/11/2023)    Housing Stability Vital Sign     Unable to Pay for Housing in the Last Year: No     Number of Places Lived in the Last Year: 1     Unstable Housing in the Last Year: No   [4]   Allergies  Allergen Reactions    Fruit Blend Flavor [Flavoring Agent (Non-Screening)] Anaphylaxis and Swelling     Tropical Fruit     Latex Hives, Swelling, Other (See Comments) and Drug-Induced Flushing    Adhesive [Wound Dressing Adhesive] Itching     Cyanoacrylate Itching    Other Itching and Rash     Sutures Monocryl    Oxycodone Hallucinations   [5]   Current Outpatient Medications:     Atogepant  60 MG Tab, TAKE 1 TABLET (60 MG) BY MOUTH NIGHTLY FOR MIGRAINE PREVENTION, Disp: 30 each, Rfl: 11    atomoxetine  (STRATTERA ) 25 MG capsule, Take 1 capsule (25 mg) by mouth every morning for 7 days, THEN 2 capsules (50 mg) every morning for 7 days, THEN 3 capsules (75 mg) every morning for 7 days, THEN 4 capsules (100 mg) every morning., Disp: 120 capsule, Rfl: 5    botulinum toxin type A  (BOTOX ) 200 units injection, INJECT 155 UNITS INTRAMUSCULARLY EVERY 12 WEEKS, Disp: 1 each, Rfl: 3    busPIRone  (BUSPAR ) 5 MG tablet, Take 1 tablet (5 mg) by mouth 2 (two) times daily, Disp: 180 tablet, Rfl: 3    clindamycin  (CLEOCIN  T) 1 % lotion, Apply topically every morning, Disp: , Rfl:     colesevelam  (WELCHOL ) 625 MG tablet, TAKE 2 TABLETS (1,250 MG) BY MOUTH EVERY MORNING, Disp: 180 tablet, Rfl: 3    cyclobenzaprine  (FLEXERIL ) 5 MG tablet, Take 1 tablet (5 mg) by mouth 3 (three) times daily as needed for Muscle spasms (for muscle spasms), Disp: 30 tablet, Rfl: 0    Dapsone 7.5 % Gel, Apply topically every morning, Disp: , Rfl:     dicyclomine  (BENTYL ) 20 MG tablet, TAKE 1 TABLET (20 MG) BY MOUTH EVERY 6 (SIX) HOURS, Disp: 360 tablet, Rfl: 3    divalproex , ER, extended release (DEPAKOTE  ER) 500 MG 24 hr tablet, Take 1 tablet (500 mg) by mouth 3 (three) times daily, Disp: 270 tablet, Rfl: 3    dorzolamide-timolol (COSOPT) 2-0.5 % ophthalmic solution, , Disp: , Rfl:     eletriptan  (RELPAX ) 40 MG tablet, Take 1 tablet (40 mg) by mouth as needed for Migraine .  Wait at least 2 hours prior to taking second dose.  Max 2 tabs/24 hours. Do not use more than 3 days per week., Disp: 12 tablet, Rfl: 5    folic acid  (FOLVITE ) 1 MG tablet, Take 1 tablet (1,000 mcg) by mouth daily, Disp: 90 tablet, Rfl: 1  hydrOXYzine  (ATARAX ) 10 MG tablet, TAKE 1 TABLET BY MOUTH EVERY DAY NIGHTLY, Disp:  90 tablet, Rfl: 3    inFLIXimab  100 MG injection, Infuse into the vein once every eight weeks, Disp: , Rfl:     Lacosamide  (VIMPAT ) 200 MG Tab, TAKE 1 TABLET (200 MG) BY MOUTH 2 (TWO) TIMES DAILY, Disp: 180 tablet, Rfl: 0    lidocaine -prilocaine (EMLA) cream, Apply topically as needed Dermatology procedures, Disp: , Rfl:     Melatonin-Pyridoxine (MELATIN PO), Take 4 mg by mouth nightly, Disp: , Rfl:     methocarbamol  (ROBAXIN ) 750 MG tablet, Take 1 tablet (750 mg) by mouth 4 (four) times daily, Disp: 30 tablet, Rfl: 2    methotrexate  2.5 MG tablet, Take 2 tablets (5 mg) by mouth once a week, Disp: 24 tablet, Rfl: 1    Multiple Vitamin (MULTIVITAMIN PO), Take by mouth daily, Disp: , Rfl:     omeprazole  (PriLOSEC) 40 MG capsule, TAKE 1 CAPSULE BY MOUTH EVERY DAY, Disp: 90 capsule, Rfl: 1    ondansetron  (ZOFRAN -ODT) 4 MG disintegrating tablet, Take 1 tablet (4 mg) by mouth every 8 (eight) hours as needed for Nausea, Disp: 30 tablet, Rfl: 2    Otezla  30 MG Tab, TAKE 1 TABLET BY MOUTH 2 TIMES A DAY, Disp: 180 tablet, Rfl: 1    predniSONE  (DELTASONE ) 10 MG tablet, TAKE 1 TABLET (10 MG) BY MOUTH DAILY., Disp: 30 tablet, Rfl: 5    Rimegepant Sulfate  75 MG Tablet Dispersible, TAKE 1 TABLET (75 MG) BY MOUTH AS NEEDED (MIGRAINE), Disp: 16 each, Rfl: 11    valACYclovir  (VALTREX ) 1000 MG tablet, Take 2 tabs at onset; repeat once in 12 hours.  Total 4 tabs per outbreak, Disp: 12 tablet, Rfl: 3    vitamin B-12 1000 MCG tablet, Take 1 tablet (1,000 mcg) by mouth daily, Disp: 30 tablet, Rfl: 0    VITAMIN D  PO, Take 1 tablet by mouth daily Dosage unsure, Disp: , Rfl:     EPINEPHrine  (EPIPEN  2-PAK) 0.3 MG/0.3ML Solution Auto-injector injection, Inject 0.3 mLs (0.3 mg) into the muscle once as needed (Anaphylaxis) (Patient not taking: Reported on 12/27/2023), Disp: 2 each, Rfl: 2    Current Facility-Administered Medications:     onabotulinumtoxin A (BOTOX ) injection 175 Units, 175 Units, Intramuscular, Q12 Weeks, Kastl, Charlotte M, MD,  175 Units at 08/16/23 1013

## 2023-12-27 NOTE — Progress Notes (Signed)
 [facility]  [facility]  Telephone: [phone]  Fax: [phone]  Send us  a mychart or inbasket message for the fastest response  ~~~~~~~~~~~~~~~~~~~~~~~~~~~~~~~~~~~~~~~~~~~~~~~~~~~~~~~~~~~~~~~~~~~~~~~~~~~~~~~~~~~~  Visit Date: 12/27/2023    Referring Neurologist:     CC: Bechet's, neuro-bechet's    HPI:   History was obtained from records review, patient,     43 y.o. year-old female with HLA-B51 pos Behcet's with neuro Behcet's (CSF matched OCB; neg brain mri), on infliximab  (drug level 7; low level ADA present), otezla  and mtx 5mg .    She has some more mouth sores.       EXAM  Visit Vitals  BP 136/69 (BP Site: Left arm, Patient Position: Sitting, Cuff Size: X-Large)   Pulse (!) 102   Resp 18   Ht 1.727 m (5' 8)   LMP  (LMP Unknown)   SpO2 98%   BMI 48.67 kg/m     General:   Optic Nerves:   Psychiatric.   Mental Status: The patient was awake, alert, appropriate. Slow speech  Cranial Nerves: CN II-XII ok  Motor: 5/5 except 4+/5 LUE  Reflex:   Sensation: marked LUE sensory ataxia  Coordination:   Gait:       Imaging     Reports:  MRI Brain W WO Contrast    Result Date: 03/07/2023  Impression:   No intracranial mass, acute hemorrhage, acute infarction, or evidence of hydrocephalus. Normal MRI of the brain. Electronically signed by: Gladis Minks M.D. [facility] MK: 03/07/23    MRI Brain W WO Contrast    Result Date: 01/28/2023  Impression:  No acute intracranial or orbital abnormality. Milan Arne Jumbo, MD 01/28/2023 7:57 AM    CT Head without Contrast    Result Date: 01/27/2023  Impression:   1.No intracranial abnormality is seen. 2. Given the history and symptoms, follow-up MRI may be of benefit. Belvie Lack, MD 01/27/2023 2:29 PM       Testing   OVernight eeg was normal (09/06/23)    NPsych testing  - attention good  - impaired verbal list learning; anomia  - psychomotor slowing & impaired processing speed  - psychologic fx are not a  significant contributor      Labs  Lab Results   Component Value Date    HGBA1C 5.4 01/28/2023    EGFR >60.0 12/12/2023    CREAT 0.8 12/12/2023    WBC 14.28 (H) 12/12/2023    LYMPHOABS 2.5 07/25/2022    IGG 950 02/20/2023    IGA 216 01/25/2021    IGM 198 01/25/2021    B12 1,652 (H) 12/12/2023    VITD 39 12/12/2023       ASSESSMENT / PLAN  RTC: Return in about 2 months (around 02/24/2024) for [place].    1. Behcet's disease w/ CNS involvement        43 y.o. 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 She will probably require more aggressive Bechet's treatment. However, MTX dose increase is limited by slight LFT increase. So will reduce VPA & recheck LFT's in 2 months.    Modafinil  gave her energy but made tremors much worse.     Best way to track cognitive fxn is with neuropsych testing. We can consider repeating MRI but previous MRI did not show any abnormalities.  PLAN  - Continue remicade , mtx 5mg , prednisone  10mg , otezla  30mg  bid  - cognitive rehab  - reduce VPA 500mg  tid to improve LFT  - vimpat  200mg  bid  - d/c modafinil  200mg  daily; replace with atomoxetine  & titrate to 100mg  qam  - vitamin d3 2000 units daily    Orders today (details below)  No orders of the defined types were placed in this encounter.    Orders Placed This Encounter   Medications    divalproex , ER, extended release (DEPAKOTE  ER) 500 MG 24 hr tablet     Sig: Take 1 tablet (500 mg) by mouth 3 (three) times daily     Dispense:  270 tablet     Refill:  3    atomoxetine  (STRATTERA ) 25 MG capsule     Sig: Take 1 capsule (25 mg) by mouth every morning for 7 days, THEN 2 capsules (50 mg) every morning for 7 days, THEN 3 capsules (75 mg) every morning for 7 days, THEN 4 capsules (100 mg) every morning.     Dispense:  120 capsule     Refill:  5       I spent 40 minutes reviewing  the records, talking with the patient and developing their care plan. This excludes time for separately billed procedures.      Sammi Lockwood, MD PHD  Director, Neuroimmunology & MS Center  [facility]  [employer]  [address], [place], [place] [address]    Telephone [phone]  Fax [phone]   The fastest response is via national city  ===================================================================  NFL (neurofilament light chain) Insurance Justification  Serum / plasma Neurofilament light chain is a well-established biomarker for monitoring MS, and regular testing has been incorporated into the Consortium of MS Centers [facility]) guidelines for the monitoring of MS. https://mscare.http://www.stevens.com/     PATIENT INSTRUCTIONS PROVIDED  Patient was given an After Visit Summary with a copy of the testing orders, medications and the following other instructions:  There are no Patient Instructions on file for this visit.    IMPORTED INFORMATION FROM MEDICAL RECORDS   Diagnosis ICD-10-CM Associated Order   1. Behcet's disease w/ CNS involvement  M35.2         No orders of the defined types were placed in this encounter.

## 2023-12-28 ENCOUNTER — Ambulatory Visit: Payer: BLUE CROSS/BLUE SHIELD

## 2023-12-28 DIAGNOSIS — R41841 Cognitive communication deficit: Secondary | ICD-10-CM

## 2023-12-28 DIAGNOSIS — R413 Other amnesia: Secondary | ICD-10-CM

## 2023-12-28 DIAGNOSIS — R41844 Frontal lobe and executive function deficit: Secondary | ICD-10-CM

## 2023-12-28 DIAGNOSIS — R41840 Attention and concentration deficit: Secondary | ICD-10-CM

## 2023-12-28 NOTE — Progress Notes (Signed)
 [facility]  [address]  [place], [place]  [address]  Phone:  [phone]  Fax:  [phone]    SPEECH THERAPY DAILY TREATMENT NOTE    PATIENT: Hailey [name] DOB: 1981/02/27   MR #: [id number]  AGE: 43 y.o.    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];Memory loss [R41.3]  TREATMENT DIAGNOSIS: Executive function deficit R41.844 Memory Deficits R41.3 Attention Deficits R41.840 Cognitive-Communication Deficit R41.841     Date of Service SLP Received On: 12/28/23   Treatment Time Start Time: 1102 to Stop Time: 1200   Time Calculation Time Calculation (min): 58 min   Visit # SLP Visit Number: 5   Units Billed   SLP Treatments  $ SLP Treatment Indiv [phone]): 1 Procedure     Medications:  has a current medication list which includes the following prescription(s): atogepant , atomoxetine , botulinum toxin type a , buspirone , clindamycin , colesevelam , cyclobenzaprine , dapsone, dicyclomine , divalproex  (er) extended release, dorzolamide-timolol, eletriptan , epinephrine , folic acid , hydroxyzine , infliximab , lacosamide , lidocaine -prilocaine, melatonin-pyridoxine, methocarbamol , methotrexate , multiple vitamin, omeprazole , ondansetron , otezla , prednisone , rimegepant sulfate , valacyclovir , cyanocobalamin , and vitamin d , and the following Facility-Administered Medications: onabotulinumtoxin a.  Patient/Caregiver does not report changes to medication at this time.    Precautions:   Seizures - Most recent seizure: December 16, 2023                    Rescue Plan: No rescue plan, typically come out of it 90 after 90 seconds                    At times a sense of deja vu and then unresponsive     Allergies:  Allergies[1]    SUBJECTIVE REPORT:  Patient presents for speech therapy today alone.     Patient  reports she saw neurologist yesterday, who got the results of the neuropsych evaluation. Evaluation revealed language, executive function, and memory were in the lower percentiles. Neurology changed medications, these are reflected above. Follow up with neurologist will be in 8 weeks. Pt is beginning to work towards medical retirement.     Pt reports pacing went better over the last week.     Pain:    3-4 on a scale of 1 - 10; location: headache    OBJECTIVE FINDINGS:  Interventions / Goal Status:  Goals copied from last visit:    Short Term Goals:  To be met by 6 weeks from 11/30/2023  Pt will demonstrate use of attention and executive strategies (self-talk, go slow, double-check, etc.) during complex structured tasks with 90% acc when provided with priming for strategies in order to increase familiarity with attention and executive strategies for use in functional tasks.  Pt will monitor for word choice, verbal organization, relevance and clarity with appropriate self-corrections when provided with no more than 2 cues from clinician in order to accurately communicate information within structured language tasks (i.e. summarizing written information, answering question, describing event, etc.) in 4/5 opportunities in order to improve efficient and effective communication with spouse, friends, and medical professionals.   Pt will utilize word finding strategies during structured language tasks and conversation in 4/5 opportunities when provided with min cues in order to improve efficient communication with spouse, friends, and medical professionals.   Pt will utilize attentive reading  and constrained summarization when reading short paragraphs (3-5 sentences) of material relevant to their interests and daily life (personal letters, news, magazine articles, etc.) and answer questions regarding material with 90% acc when provided with min cues across 2 sessions to improve reading comprehension.      Long Term Goals:   To be met by 12 weeks from 11/30/2023  Pt will utilize executive function and internal/external memory strategies to plan/recall/complete functional and professional tasks when provided with priming for strategy use at least 5/7 days per week, based on pt report.  Pt will report improved cognitive communication skills demonstrated by an increased score on patient reported outcome measure (PROMIS Item Bank v2.0 - Cognitive Function - Short Form 8a) from a baseline score of 16/40.  Pt will demonstrate effective processing speed and complex attention as evidenced by scoring WNL on all subtests of the Attention Processing Training Test (APT).      GOAL # INTERVENTIONS PROGRESS   STG 1 Cognitive Pacing Scale    Attention strategies - Dedicated time for tasks, break tasks down, identify and manage distractions, etc. PROGRESSING:    Pt able to verbalize at least 2 attention strategies independently; not utilized in structured tasks.     STG 2  NOT ADDRESSED TODAY.   STG 3  NOT ADDRESSED TODAY.   STG 4  NOT ADDRESSED TODAY.        LTG 1  NOT ADDRESSED TODAY.   LTG 2  NOT ADDRESSED TODAY.   LTG 3  MET.    Attention Process Training Test  Sustained Attention: 26/30 (normative mean: 29.1 +/- 0.4)  Complex Sustained Attention: 18/30 (normative mean: 19.5 +/- 5.4)  Selective Attention: 14/30 (normative mean: 22.3 +/- 6.6)  Divided Attention: 13/30 (normative mean: 27.6 +/- 5.7)  Alternating Attention: 6/24 (normative mean: 16.8 +/- 3.9)     Education/Home Exercise Program:    Patient was educated on strategies and techniques used in treatment session.  Alternating and divided attention strategies introduced via handout and verbal explanation.    Response to Treatment today:   Alternating and divided attention strategies introduced via handout and verbal explanation. Pt receptive to use of attention strategies. SLP provided practice scenarios for home. Pacing reviewed. Pt with more effective pacing per pt report.    ASSESSMENT:  Hailey [name] is a 43 y.o. female presents to outpatient therapy today with mild cognitive communication impairments, mild memory impairments, and mild executive function impairments. These deficits impact the pt's ability to complete functional tasks in the home such as cooking, organizing a list, reading complex material, and recalling medical information. Pt warrants skilled SLP intervention to address deficits and train cognitive linguistic compensatory strategies for increased independence in functional tasks.     The following impairments were noted:  Mild cognitive communication impairment, as characterized by deficits in word finding in conversation, phonemic fluency, comprehension of basic and complex written material, and problem solving.  Mild memory impairment, as characterized by impairments in delayed recall of new information.   Mild executive function and attention impairment, as characterized by impairments in sustained attention, alternating/divided attention, and planning/organizing.      Patient presents with the following functional limitations:  Patient has difficulty with communicating at the level of conversation.  Unable to effectively implement attention / memory strategies for sustained attention, alternating/divided attention, and recall of new information.  Difficulty with executive function skills such as  problem solving and planning/organizing.     Without intervention, patient is at risk  for:  Decreased social interactions that may lead to further cognitive decline and contribute to further medical complications and increased fall risk.  Inability to hold current job position and loss of income.  Burden of care.  Depression.    Patient is progressing well toward above mentioned functional goals.    PLAN:   It is medically necessary for Hailey [name] to recieve skilled speech therapy intervention in order to address the above impairments and functional limitations.     Recommend continue per plan of care to work toward increased cognitive and communication abilities for safety and independence with daily activities and in the community.     NEXT VISIT: Focus on word finding strategies, review attention strategies    Therapist Signature:    Bernarda Hench M.A., CCC-SLP  Speech Language Pathologist  [facility]  Outpatient Specialty Rehabilitation  [email or web address]    12/28/2023         [1]   Allergies  Allergen Reactions    Fruit Blend Flavor [Flavoring Agent (Non-Screening)] Anaphylaxis and Swelling     Tropical Fruit     Latex Hives, Swelling, Other (See Comments) and Drug-Induced Flushing    Adhesive [Wound Dressing Adhesive] Itching    Cyanoacrylate Itching    Other Itching and Rash     Sutures Monocryl    Oxycodone Hallucinations

## 2023-12-29 ENCOUNTER — Other Ambulatory Visit: Payer: Self-pay

## 2023-12-29 MED FILL — Rimegepant Sulfate Tab Disint 75 MG: ORAL | 30 days supply | Qty: 16 | Fill #0 | Status: AC

## 2023-12-29 NOTE — Progress Notes (Signed)
 Specialty Pharmacy Refill Note    Hailey [name] is a 43 y.o. female, who is being followed by [facility] team for management of: RxSp Migraine (Enrolled) for the following services:  Refill Management  Benefits and PA Management    Medications monitored by Specialty Pharmacy:   Atogepant  60 MG Tab   TAKE 1 TABLET (60 MG) BY MOUTH NIGHTLY FOR MIGRAINE PREVENTION    Rimegepant Sulfate  75 MG Tablet Dispersible   TAKE 1 TABLET (75 MG) BY MOUTH AS NEEDED (MIGRAINE)      Refill Coordination  Medications Linked to Program: Atogepant ; Rimegepant Sulfate   HIPAA verified (patient name & dob or patient name & address) with approved contact: Yes  How has _______ (specialty medication) helped you manage your _______ (condition) from very well to very poor: Well  Changes to allergies?: No  Changes to medications, herbals or supplements?: Yes  Medication list reconciliation: updates/changes notes: Started Atomoxetine  25mg , stopped taking Modafinil  100  New conditions (diagnosis) since last pharmacy outreach: No  Since the last fill, has the patient experienced any unplanned office visit, urgent care, emergency room, or hospital admission: No  Since the last fill, any new or worsened side effects?: No  Financial problems or insurance changes : No  Since the last fill, has the patient missed any doses of their specialty medication : No  Doses left of specialty medications: 4  Does the patient have the correct number of remaining doses: No  Patient confirmations: received welcome packet, patient bill of rights, & privacy practices: Patient has received all  Patient confirmation: patient previously received the drug monograph(s) for their specialty medication(s): Yes    Delivery Information  Delivery confirmation: signature required for delivery: No  Delivery method: FedEx  Delivery confirmation: delivery address: Delivery Address Reviewed & Accurate  Delivery phone number: [phone]  Delivery  confirmation: patient informed of and confirms delivery date. Enter Delivery date: : 01/02/24  Preferred delivery time?: Anytime  Number of medications in delivery: 1  Is there any medication that is due not being filled?: No  Supplies needed?: No supplies needed  Does patient have any concerns about safely storing medications (at the correct temperature, away from children/pets,etc.)?: No  Do any medications need mixed or dated?: No  Financial confirmation: patient informed of financial responsibility and copay amount: Yes  Financial responsibility: copay amount: 0.00  Copay form of payment: Credit card on file  Questions or concerns for the pharmacist?: No  Are any medications first time fills?: No        Eric Matos-Pacheco

## 2024-01-01 ENCOUNTER — Other Ambulatory Visit: Payer: Self-pay

## 2024-01-01 ENCOUNTER — Telehealth: Payer: Self-pay

## 2024-01-01 DIAGNOSIS — G43709 Chronic migraine without aura, not intractable, without status migrainosus: Secondary | ICD-10-CM

## 2024-01-01 DIAGNOSIS — G43711 Chronic migraine without aura, intractable, with status migrainosus: Secondary | ICD-10-CM

## 2024-01-01 MED ORDER — ELETRIPTAN HYDROBROMIDE 40 MG PO TABS
40.0000 mg | ORAL_TABLET | ORAL | 5 refills | Status: DC | PRN
Start: 2024-01-01 — End: 2024-09-16

## 2024-01-01 NOTE — Telephone Encounter (Signed)
 Eletriptan sent to patients pharmacy

## 2024-01-01 NOTE — Telephone Encounter (Signed)
 Pt pharmacy request new Rx for ELETRIPTAN 40 MG tab

## 2024-01-01 NOTE — Addendum Note (Signed)
 Addended by: Kandyce Rud on: 01/01/2024 04:35 PM     Modules accepted: Orders

## 2024-01-02 ENCOUNTER — Other Ambulatory Visit (INDEPENDENT_AMBULATORY_CARE_PROVIDER_SITE_OTHER): Payer: Self-pay | Admitting: Family Medicine

## 2024-01-02 ENCOUNTER — Encounter (INDEPENDENT_AMBULATORY_CARE_PROVIDER_SITE_OTHER): Payer: Self-pay | Admitting: Family Medicine

## 2024-01-02 DIAGNOSIS — M461 Sacroiliitis, not elsewhere classified: Secondary | ICD-10-CM

## 2024-01-02 MED ORDER — METHOCARBAMOL 750 MG PO TABS
750.0000 mg | ORAL_TABLET | Freq: Four times a day (QID) | ORAL | 2 refills | Status: DC
Start: 2024-01-02 — End: 2024-10-02

## 2024-01-02 NOTE — Telephone Encounter (Signed)
 Last VV: 11/02/2023  Last OV: 09/25/23

## 2024-01-03 ENCOUNTER — Other Ambulatory Visit: Payer: Self-pay | Admitting: Internal Medicine

## 2024-01-03 ENCOUNTER — Ambulatory Visit
Admission: RE | Admit: 2024-01-03 | Discharge: 2024-01-03 | Disposition: A | Discharge status: Home / Self Care | Payer: BLUE CROSS/BLUE SHIELD | Source: Ambulatory Visit | Attending: Internal Medicine | Admitting: Internal Medicine

## 2024-01-03 ENCOUNTER — Ambulatory Visit: Payer: BLUE CROSS/BLUE SHIELD

## 2024-01-03 DIAGNOSIS — Z79899 Other long term (current) drug therapy: Secondary | ICD-10-CM | POA: Insufficient documentation

## 2024-01-03 DIAGNOSIS — M352 Behcet's disease: Secondary | ICD-10-CM | POA: Insufficient documentation

## 2024-01-03 DIAGNOSIS — M25572 Pain in left ankle and joints of left foot: Secondary | ICD-10-CM | POA: Insufficient documentation

## 2024-01-04 ENCOUNTER — Ambulatory Visit: Payer: BLUE CROSS/BLUE SHIELD

## 2024-01-07 ENCOUNTER — Encounter: Payer: Self-pay | Admitting: Internal Medicine

## 2024-01-08 ENCOUNTER — Encounter (INDEPENDENT_AMBULATORY_CARE_PROVIDER_SITE_OTHER): Payer: Self-pay

## 2024-01-08 ENCOUNTER — Other Ambulatory Visit: Payer: Self-pay

## 2024-01-09 ENCOUNTER — Ambulatory Visit: Payer: BLUE CROSS/BLUE SHIELD | Admitting: Neurology

## 2024-01-09 NOTE — Progress Notes (Signed)
 CARE TEAM:  Patient Care Team:  Hailey Handler, MD as PCP - General (Family Medicine)    ASSESSMENT  1. SI (sacroiliac) joint dysfunction    2. Behcet's disease       Impression: Patient presents for follow-up of left-sided low back/gluteal pain in the context of SI joint dysfunction.  Significantly improved following recent SIJ injection.  At this point her dominant she was similar symptoms on the right side, also likely due to SIJ dysfunction as she is tender on exam.  May benefit from SIJ injection on the right side, we will plan for this.  Medications were discussed and she is stable on her current regimen from her PCP as below.  We will hold off on pharmacological treatment at this time.    This patients pain is primarily experienced between the upper level of the iliac crests and the gluteal fold. Clinical findings and imaging studies suggest no other obvious cause of the pain. She has Reproduction of pain using the following provocative tests  right FABER/Patrick's Test, Thigh Thrust , Sacroiliac Joint Compression Test. The pain has be presents for more than 4 weeks and has failed conservative management anti-inflammatories, analgesic medications , activity modification, and exercise.  This patient is actively involved in a home exercise program     PLAN  Treatment Plan:   Orders Placed This Encounter   . Spine Injection   . BP Patient Education      Plan right SIJ Injection   No meds at this time    HISTORY OF PRESENT ILLNESS  Chief Complaint: No chief complaint on file.   Age: 43 y.o.  Sex: female     Hand-dominance: right  History of present illness:    Patient presents for evaluation.      To recap, Patient has been experiencing pain in the left-sided low back and gluteal region which has been ongoing since Thanksgiving 2024.  The pain feels really deep and socked in.  It is relatively constant, sometimes worse.  Never radiating down the leg.  She does note an experience of sciatica in the past, but  this does not feel similar to that.  She has gone to the ER few times for these episodes, but most recently in Thanksgiving 2024, at which point they did a CT scan and there was not any sort of frank abnormality except for arthritis at L4-L5 and L5-S1, little bit more in the left compared to the right.    It to be noted that she is no real stranger to musculoskeletal aches and pains and carries a diagnosis of Behcet's and neuro Behcet's.  She is on methotrexate , prednisone , Robaxin , Remicade , etc.  Also hydrocodone  as needed.  She is also diagnosed with scoliosis when she was young and has been dealing with the sequela of this as well.  She that point, she also has some mid back pain, but not her prior at this time.    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    MRI lumbar spine from February 2024 was also independently reviewed and it does show again some very mild degenerative changes at the L4-L5 level, and her scoliosis is appreciated, but she does not have any nerve impingement and really minimal disc degeneration.    Interventionally, patient has had  12/06/2023: Left SIJ injection -helped a lot     Today, patient notes the left SIJ  injection helped a lot in terms of the pain she was having on the left gluteal region, but at this point she notes pain more so in the right side, similar which she was having on the left side.  She notes that she has had this periodically as well and it is her more dominant symptom now.  Nothing radiating down the legs.  Not much pain in the low back itself apart from a low level ache, which is her baseline.    Medication wise, patient is currently taking     Robaxin  PRN - helps, haven't needed recently    Hydrocodone  PRN - helps, hasn't needed recently   Remicade    Methotrexate    Modafinil     Prednisone         Patient Active Problem List   Diagnosis   . Abdominal pain   . Altered bowel habits   .  Anxiety   . Behcet disease with multisystem involvement   . Chronic cholecystitis   . Diarrhea   . Herpes labialis   . History of Wolff-Parkinson-White (WPW) syndrome   . Nonalcoholic fatty liver   . Post-cholecystectomy syndrome   . Premature ventricular contraction   . S/P hysterectomy   . Scoliosis   . Seizure   . Seronegative arthritis   . Speech abnormality   . Anterior uveitis   . High risk medication use   . NICM (nonischemic cardiomyopathy)       OBJECTIVE  Constitutional:  No acute distress. Her body mass index is 47.9 kg/m.   Eyes:  Sclera are nonicteric.  Respiratory:  No labored breathing.  Cardiovascular:  No marked edema.  Skin:  No marked skin ulcers.  Psychiatric: Alert and oriented x3.    Mild TTP lumbar paraspinal musculature bilaterally.  No significant TTP left SIJ.  Significant TTP right SIJ.  Some decrease strength diffusely of the left lower extremity chronic due to Behcet's per patient.   Strength 5/5 to right lower extremity    IMAGING / STUDIES         No imaging obtained       PROCEDURES  Procedures    I performed this examination under the supervision of Hailey Aver, MD.    Voice recognition software was utilized during this note, and there may be transcription errors.     Hailey Zacharias, PA-C

## 2024-01-11 ENCOUNTER — Ambulatory Visit: Payer: BLUE CROSS/BLUE SHIELD | Attending: Neurology

## 2024-01-11 DIAGNOSIS — R41841 Cognitive communication deficit: Secondary | ICD-10-CM | POA: Insufficient documentation

## 2024-01-11 DIAGNOSIS — R41840 Attention and concentration deficit: Secondary | ICD-10-CM | POA: Insufficient documentation

## 2024-01-11 DIAGNOSIS — R41844 Frontal lobe and executive function deficit: Secondary | ICD-10-CM | POA: Insufficient documentation

## 2024-01-11 DIAGNOSIS — R413 Other amnesia: Secondary | ICD-10-CM | POA: Insufficient documentation

## 2024-01-11 DIAGNOSIS — M352 Behcet's disease: Secondary | ICD-10-CM | POA: Insufficient documentation

## 2024-01-11 NOTE — Progress Notes (Signed)
 [facility]  [address]  [place], [place]  [address]  Phone:  [phone]  Fax:  [phone]    SPEECH THERAPY DAILY TREATMENT NOTE    PATIENT: Hailey [name] DOB: 09/26/1981   MR #: [id number]  AGE: 43 y.o.    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];Memory loss [R41.3]  TREATMENT DIAGNOSIS: Executive function deficit R41.844 Memory Deficits R41.3 Attention Deficits R41.840 Cognitive-Communication Deficit R41.841     Date of Service SLP Received On: 01/11/24   Treatment Time Start Time: 1102 to Stop Time: 1200   Time Calculation Time Calculation (min): 58 min   Visit # SLP Visit Number: 6   Units Billed   SLP Treatments  $ SLP Treatment Indiv [phone]): 1 Procedure     Medications:  has a current medication list which includes the following prescription(s): atogepant , atomoxetine , botulinum toxin type a , buspirone , clindamycin , colesevelam , cyclobenzaprine , dapsone, dicyclomine , divalproex  (er) extended release, dorzolamide-timolol, eletriptan , epinephrine , folic acid , hydroxyzine , infliximab , lacosamide , lidocaine -prilocaine, melatonin-pyridoxine, methocarbamol , methotrexate , multiple vitamin, omeprazole , ondansetron , otezla , prednisone , rimegepant sulfate , valacyclovir , cyanocobalamin , and vitamin d , and the following Facility-Administered Medications: onabotulinumtoxin a.  Patient/Caregiver does not report changes to medication at this time.    Precautions:   Seizures - Most recent seizure: December 16, 2023                    Rescue Plan: No rescue plan, typically come out of it after 90 seconds                    At times a sense of deja vu and then unresponsive     Allergies:  Allergies[1]    SUBJECTIVE REPORT:  Patient presents for speech therapy today alone. Pt got shots in  right SI joint and will be getting shots in left side on Monday.    Pt is using games at home for practice with attention.     Pain:    5 on a scale of 1 - 10; location: SI joints      OBJECTIVE FINDINGS:  Interventions / Goal Status:  Goals copied from last visit:    Short Term Goals:  To be met by 6 weeks from 11/30/2023  Pt will demonstrate use of attention and executive strategies (self-talk, go slow, double-check, etc.) during complex structured tasks with 90% acc when provided with priming for strategies in order to increase familiarity with attention and executive strategies for use in functional tasks.  Pt will monitor for word choice, verbal organization, relevance and clarity with appropriate self-corrections when provided with no more than 2 cues from clinician in order to accurately communicate information within structured language tasks (i.e. summarizing written information, answering question, describing event, etc.) in 4/5 opportunities in order to improve efficient and effective communication with spouse, friends, and medical professionals.   Pt will utilize word finding strategies during structured language tasks and conversation in 4/5 opportunities when provided with min cues in order to improve efficient communication with spouse, friends, and medical professionals.   Pt will utilize attentive reading and constrained summarization when reading short paragraphs (3-5 sentences) of material relevant to their interests and daily life (personal letters, news, magazine articles, etc.) and answer questions regarding material with 90% acc when provided with  min cues across 2 sessions to improve reading comprehension.      Long Term Goals:  To be met by 12 weeks from 11/30/2023  Pt will utilize executive function and internal/external memory strategies to plan/recall/complete functional and professional tasks when provided with priming for strategy use at least 5/7 days per week, based on pt report.  Pt  will report improved cognitive communication skills demonstrated by an increased score on patient reported outcome measure (PROMIS Item Bank v2.0 - Cognitive Function - Short Form 8a) from a baseline score of 16/40.  Pt will demonstrate effective processing speed and complex attention as evidenced by scoring WNL on all subtests of the Attention Processing Training Test (APT).      GOAL # INTERVENTIONS PROGRESS   STG 1 Cognitive Pacing Scale    Attention strategies - Dedicated time for tasks, break tasks down, identify and manage distractions, etc. PROGRESSING:    Pt able to verbalize at least 2 attention strategies independently; not utilized in structured tasks.     STG 2  NOT ADDRESSED TODAY.   STG 3  NOT ADDRESSED TODAY.   STG 4  NOT ADDRESSED TODAY.        LTG 1  EMERGING:     Internal memory strategies introduced; will continue next session.    LTG 2  NOT ADDRESSED TODAY.   LTG 3  MET.    Attention Process Training Test  Sustained Attention: 26/30 (normative mean: 29.1 +/- 0.4)  Complex Sustained Attention: 18/30 (normative mean: 19.5 +/- 5.4)  Selective Attention: 14/30 (normative mean: 22.3 +/- 6.6)  Divided Attention: 13/30 (normative mean: 27.6 +/- 5.7)  Alternating Attention: 6/24 (normative mean: 16.8 +/- 3.9)     Education/Home Exercise Program:    Patient was educated on strategies and techniques used in treatment session.  Internal memory strategies introduced via handout and verbal explanation.  Processing strategies introduced verbally.     Response to Treatment today:   Internal memory strategies introduced via handout and verbal explanation, but unable to complete education. Will continue education on memory strategies next session. SLP and pt discussed processing strategies and how to utilize in various situations. SLP to provide processing strategies handout next session.     ASSESSMENT: Hailey [name] is a 43 y.o. female presents to outpatient therapy today with mild cognitive  communication impairments, mild memory impairments, and mild executive function impairments. These deficits impact the pt's ability to complete functional tasks in the home such as cooking, organizing a list, reading complex material, and recalling medical information. Pt warrants skilled SLP intervention to address deficits and train cognitive linguistic compensatory strategies for increased independence in functional tasks.     The following impairments were noted:  Mild cognitive communication impairment, as characterized by deficits in word finding in conversation, phonemic fluency, comprehension of basic and complex written material, and problem solving.  Mild memory impairment, as characterized by impairments in delayed recall of new information.   Mild executive function and attention impairment, as characterized by impairments in sustained attention, alternating/divided attention, and planning/organizing.      Patient presents with the following functional limitations:  Patient has difficulty with communicating at the level of conversation.  Unable to effectively implement attention / memory strategies for sustained attention, alternating/divided attention, and recall of new information.  Difficulty with executive function skills such as  problem solving and planning/organizing.     Without intervention, patient is at risk for:  Decreased social interactions that may lead to further  cognitive decline and contribute to further medical complications and increased fall risk.  Inability to hold current job position and loss of income.  Burden of care.  Depression.    Patient is progressing well toward above mentioned functional goals.    PLAN:   It is medically necessary for Hailey [name] to recieve skilled speech therapy intervention in order to address the above impairments and functional limitations.    Recommend continue per plan of care to work toward increased cognitive and communication  abilities for safety and independence with daily activities and in the community.     NEXT VISIT: Focus on internal and external memory strategies, processing strategies     Therapist Signature:    Bernarda Hench M.A., CCC-SLP  Speech Language Pathologist  [facility]  Outpatient Specialty Rehabilitation  [email or web address]    01/11/2024         [1]   Allergies  Allergen Reactions    Fruit Blend Flavor [Flavoring Agent (Non-Screening)] Anaphylaxis and Swelling     Tropical Fruit     Latex Hives, Swelling, Other (See Comments) and Drug-Induced Flushing    Adhesive [Wound Dressing Adhesive] Itching    Cyanoacrylate Itching    Other Itching and Rash     Sutures Monocryl    Oxycodone Hallucinations

## 2024-01-15 NOTE — Progress Notes (Signed)
 Procedures:    Sacroiliac R SI Joint      Performed by: Zona Houston, MD  Authorized by: Zacharias, Maryann, PA      Consent Given by:  Patient  Site marked: the procedure site was marked    Timeout: prior to procedure the correct patient, procedure, and site was verified    Preparation:  Skin prepped with alcohol and skin prepped with Betadine  Procedure:  Sacroiliac  Indications:  Diagnostic evaluation and therapeutic benefit  Guidance:  Fluoroscopy  Site(s):  R SI Joint  Site(s):     Additional Procedural Details:  The patient was given the opportunity to ask questions regarding the procedure, its indications and associated risks. The risks of the procedure discussed including infection, bleeding, allergic reaction, headache, nerve injuries, and cardiovascular and CNS side effects with possible vascular entry of medications.  In addition, possible side effects or reactions to the medications potentially used during the procedure were discussed with the patient. The patient was informed both verbally and in writing. The patient understood the informed consent and desired to have the procedure performed.     After consent was obtained, the patient was placed in the prone position on the treatment table.  A time out was performed. The skin overlying the SI joint were prepped with Duraprep.The SI joint was visualized via a AP fluoroscopic view. The local tissue was then injected with Lidocaine  1%. A 22 gauge spinal needle was advanced until it penetrated the posterior joint capsule. 0.5 cc of contrast was injected and an arthorgram was confirmed in AP and lateral views. Then a solution of 3 cc of 1% lidocaine  and 40 mg of methylprednisolone  was injected in small boluses.  The needle was removed and pressure was applied to the injection site to decrease the incidence of ecchymosis and hematoma formation.  A sterile bandage was applied.    Vitals were monitored throughout the procedure.  After the procedure  they were taken back to recovery where their vitals were again monitored. They were discharged in stable condition.     Medication Administered: 1 mL depo-medrol  40 MG/ML; 1 mL iohexol 240 MG/ML; 7 mL lidocaine  1 %    Needle Size:  25 G  Needle Length:  3.5 in  PreservativeFree    Pt Tolerated Procedure:  Well, no immediate complications and with difficulty

## 2024-01-17 ENCOUNTER — Other Ambulatory Visit: Payer: Self-pay

## 2024-01-17 MED FILL — Atogepant Tab 60 MG: ORAL | 30 days supply | Qty: 30 | Fill #0 | Status: AC

## 2024-01-17 NOTE — Progress Notes (Signed)
 Specialty Pharmacy Refill Note    Hailey [name] is a 43 y.o. female, who is being followed by [facility] team for management of: RxSp Migraine (Enrolled) for the following services:  Refill Management  Benefits and PA Management    Medications monitored by Specialty Pharmacy:   Atogepant  60 MG Tab   TAKE 1 TABLET (60 MG) BY MOUTH NIGHTLY FOR MIGRAINE PREVENTION    Rimegepant Sulfate  75 MG Tablet Dispersible   TAKE 1 TABLET (75 MG) BY MOUTH AS NEEDED (MIGRAINE)      Refill Coordination  Medications Linked to Program: Atogepant   HIPAA verified (patient name & dob or patient name & address) with approved contact: Yes  HIPAA verification: please specify: Hailey [name] [birth year]  How has _______ (specialty medication) helped you manage your _______ (condition) from very well to very poor: Well  Changes to allergies?: No  Changes to medications, herbals or supplements?: No  New conditions (diagnosis) since last pharmacy outreach: No  Since the last fill, has the patient experienced any unplanned office visit, urgent care, emergency room, or hospital admission: No  Since the last fill, any new or worsened side effects?: No  Financial problems or insurance changes : No  Since the last fill, has the patient missed any doses of their specialty medication : No  Doses left of specialty medications: 4  Does the patient have the correct number of remaining doses: Yes  Patient confirmations: received welcome packet, patient bill of rights, & privacy practices: Patient has received all  Patient confirmation: patient previously received the drug monograph(s) for their specialty medication(s): Yes    Delivery Information  Delivery confirmation: signature required for delivery: No  Delivery method: [employer]Ex  Delivery confirmation: delivery address: Delivery Address Reviewed & Accurate  Enter delivery address: [address]  [place] [address]  Delivery phone number: [phone]  Delivery confirmation:  patient informed of and confirms delivery date. Enter Delivery date: : 01/19/24 (01/22/24)  Preferred delivery time?: Anytime  Number of medications in delivery: 1  Is there any medication that is due not being filled?: No  Supplies needed?: No supplies needed  Does patient have any concerns about safely storing medications (at the correct temperature, away from children/pets,etc.)?: No  Do any medications need mixed or dated?: No  Financial confirmation: patient informed of financial responsibility and copay amount: Yes  Financial responsibility: copay amount: $0  Copay form of payment: Credit card on file  Questions or concerns for the pharmacist?: No  Are any medications first time fills?: No        Medicare Fill? No    Hailey [name]

## 2024-01-18 ENCOUNTER — Ambulatory Visit: Payer: BLUE CROSS/BLUE SHIELD

## 2024-01-18 DIAGNOSIS — R41841 Cognitive communication deficit: Secondary | ICD-10-CM

## 2024-01-18 DIAGNOSIS — R41840 Attention and concentration deficit: Secondary | ICD-10-CM

## 2024-01-18 DIAGNOSIS — R41844 Frontal lobe and executive function deficit: Secondary | ICD-10-CM

## 2024-01-18 DIAGNOSIS — R413 Other amnesia: Secondary | ICD-10-CM

## 2024-01-18 NOTE — Progress Notes (Signed)
 [facility]  [address]  [place], [place]  [address]  Phone:  [phone]  Fax:  [phone]    SPEECH THERAPY DAILY TREATMENT NOTE    PATIENT: Hailey [name] DOB: [birth year]   MR #: [id number]  AGE: 43 y.o.    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];Memory loss [R41.3]  TREATMENT DIAGNOSIS: Executive function deficit R41.844 Memory Deficits R41.3 Attention Deficits R41.840 Cognitive-Communication Deficit R41.841     Date of Service SLP Received On: 01/18/24   Treatment Time Start Time: 1102 to Stop Time: 1200   Time Calculation Time Calculation (min): 58 min   Visit # SLP Visit Number: 7   Units Billed   SLP Treatments  $ SLP Treatment Indiv [id number]): 1 Procedure     Medications:  has a current medication list which includes the following prescription(s): atogepant , atomoxetine , botulinum toxin type a , buspirone , clindamycin , colesevelam , cyclobenzaprine , dapsone, dicyclomine , divalproex  (er) extended release, dorzolamide-timolol, eletriptan , epinephrine , folic acid , hydroxyzine , infliximab , lacosamide , lidocaine -prilocaine, melatonin-pyridoxine, methocarbamol , methotrexate , multiple vitamin, omeprazole , ondansetron , otezla , prednisone , rimegepant sulfate , valacyclovir , cyanocobalamin , and vitamin d , and the following Facility-Administered Medications: onabotulinumtoxin a.  Patient/Caregiver does not report changes to medication at this time.    Precautions:   Seizures - Most recent seizure: December 16, 2023                    Rescue Plan: No rescue plan, typically come out of it after 90 seconds                    At times a sense of deja vu and then unresponsive     Allergies:  Allergies[1]    SUBJECTIVE REPORT:  Patient presents for speech therapy today alone. Pt reports  feeling a little whoozy possibly pre-migraine.     Pain:    4 on a scale of 1 - 10; location: SI joints      OBJECTIVE FINDINGS:  Interventions / Goal Status:  Goals copied from last visit:    Short Term Goals:  To be met by 6 weeks from 11/30/2023  Pt will demonstrate use of attention and executive strategies (self-talk, go slow, double-check, etc.) during complex structured tasks with 90% acc when provided with priming for strategies in order to increase familiarity with attention and executive strategies for use in functional tasks.  Pt will monitor for word choice, verbal organization, relevance and clarity with appropriate self-corrections when provided with no more than 2 cues from clinician in order to accurately communicate information within structured language tasks (i.e. summarizing written information, answering question, describing event, etc.) in 4/5 opportunities in order to improve efficient and effective communication with spouse, friends, and medical professionals.   Pt will utilize word finding strategies during structured language tasks and conversation in 4/5 opportunities when provided with min cues in order to improve efficient communication with spouse, friends, and medical professionals.   Pt will utilize attentive reading and constrained summarization when reading short paragraphs (3-5 sentences) of material relevant to their interests and daily life (personal letters, news, magazine articles, etc.) and answer questions regarding material with 90% acc when provided with min cues across 2 sessions to improve reading comprehension.      Long Term Goals:  To be met by  12 weeks from 11/30/2023  Pt will utilize executive function and internal/external memory strategies to plan/recall/complete functional and professional tasks when provided with priming for strategy use at least 5/7 days per week, based on pt report.  Pt will report improved cognitive communication skills demonstrated by an  increased score on patient reported outcome measure (PROMIS Item Bank v2.0 - Cognitive Function - Short Form 8a) from a baseline score of 16/40.  Pt will demonstrate effective processing speed and complex attention as evidenced by scoring WNL on all subtests of the Attention Processing Training Test (APT).      GOAL # INTERVENTIONS PROGRESS   STG 1 Cognitive Pacing Scale    Attention strategies - Dedicated time for tasks, break tasks down, identify and manage distractions, etc. PROGRESSING:    Pt able to verbalize at least 2 attention strategies independently; not utilized in structured tasks.     STG 2  NOT ADDRESSED TODAY.   STG 3  NOT ADDRESSED TODAY.   STG 4  NOT ADDRESSED TODAY.        LTG 1  EMERGING:     Internal memory strategies introduced; will continue next session.    LTG 2  NOT ADDRESSED TODAY.   LTG 3  MET.    Attention Process Training Test  Sustained Attention: 26/30 (normative mean: 29.1 +/- 0.4)  Complex Sustained Attention: 18/30 (normative mean: 19.5 +/- 5.4)  Selective Attention: 14/30 (normative mean: 22.3 +/- 6.6)  Divided Attention: 13/30 (normative mean: 27.6 +/- 5.7)  Alternating Attention: 6/24 (normative mean: 16.8 +/- 3.9)     Education/Home Exercise Program:    Patient was educated on strategies and techniques used in treatment session.  Internal memory strategies introduced via handout and verbal explanation.  Processing strategies introduced verbally.     Response to Treatment today:   Processing strategies introduced via handout and verbal explanation; pt receptive to use. Processing strategies discussed include asking communication partner to slow down, requesting clarification/repetition, visual supports, paraphrasing/summarizing, taking notes, reduce background noise, face to face communication.     ASSESSMENT: Eilidh Marcano is a 42 y.o. female presents to outpatient therapy today with mild cognitive communication impairments, mild memory impairments, and mild executive  function impairments. These deficits impact the pt's ability to complete functional tasks in the home such as cooking, organizing a list, reading complex material, and recalling medical information. Pt warrants skilled SLP intervention to address deficits and train cognitive linguistic compensatory strategies for increased independence in functional tasks.     The following impairments were noted:  Mild cognitive communication impairment, as characterized by deficits in word finding in conversation, phonemic fluency, comprehension of basic and complex written material, and problem solving.  Mild memory impairment, as characterized by impairments in delayed recall of new information.   Mild executive function and attention impairment, as characterized by impairments in sustained attention, alternating/divided attention, and planning/organizing.      Patient presents with the following functional limitations:  Patient has difficulty with communicating at the level of conversation.  Unable to effectively implement attention / memory strategies for sustained attention, alternating/divided attention, and recall of new information.  Difficulty with executive function skills such as  problem solving and planning/organizing.     Without intervention, patient is at risk for:  Decreased social interactions that may lead to further cognitive decline and contribute to further medical complications and increased fall risk.  Inability to hold current job position and loss of income.  Burden of care.  Depression.  Patient is progressing well toward above mentioned functional goals.    PLAN:   It is medically necessary for Wanda Rock Beck to recieve skilled speech therapy intervention in order to address the above impairments and functional limitations.    Recommend continue per plan of care to work toward increased cognitive and communication abilities for safety and independence with daily activities and in the  community.     NEXT VISIT: Focus on internal and external memory strategies, review processing strategies     Therapist Signature:    Bernarda Hench M.A., CCC-SLP  Speech Language Pathologist  [facility]  Outpatient Specialty Rehabilitation  [email or web address]    01/18/2024         [1]   Allergies  Allergen Reactions    Fruit Blend Flavor [Flavoring Agent (Non-Screening)] Anaphylaxis and Swelling     Tropical Fruit     Latex Hives, Swelling, Other (See Comments) and Drug-Induced Flushing    Adhesive [Wound Dressing Adhesive] Itching    Cyanoacrylate Itching    Other Itching and Rash     Sutures Monocryl    Oxycodone Hallucinations

## 2024-01-22 ENCOUNTER — Telehealth (INDEPENDENT_AMBULATORY_CARE_PROVIDER_SITE_OTHER): Payer: BLUE CROSS/BLUE SHIELD | Admitting: Internal Medicine

## 2024-01-22 ENCOUNTER — Encounter (INDEPENDENT_AMBULATORY_CARE_PROVIDER_SITE_OTHER): Payer: Self-pay | Admitting: Internal Medicine

## 2024-01-22 DIAGNOSIS — H209 Unspecified iridocyclitis: Secondary | ICD-10-CM

## 2024-01-22 DIAGNOSIS — Z79899 Other long term (current) drug therapy: Secondary | ICD-10-CM

## 2024-01-22 DIAGNOSIS — M352 Behcet's disease: Secondary | ICD-10-CM

## 2024-01-22 DIAGNOSIS — M0600 Rheumatoid arthritis without rheumatoid factor, unspecified site: Secondary | ICD-10-CM

## 2024-01-22 MED ORDER — FOLIC ACID 1 MG PO TABS
1000.0000 ug | ORAL_TABLET | Freq: Every day | ORAL | 1 refills | Status: AC
Start: 2024-01-22 — End: ?

## 2024-01-22 MED ORDER — METHOTREXATE SODIUM 2.5 MG PO TABS
5.0000 mg | ORAL_TABLET | ORAL | 1 refills | Status: AC
Start: 2024-01-22 — End: 2024-07-08

## 2024-01-22 NOTE — Progress Notes (Signed)
 RHEUMATOLOGY FOLLOW UP NOTE  Telemedicine Documentation Requirements  Originating site (Patient location): home  Distant site (Provider location): Office  Provider and Title: Dr. Woodard  Type of Visit: Video Visit  Verbal Consent has been obtained to conduct a telemedicine visit on this date in order to minimize exposure to COVID-19.        PCP: [name], MD    HPI:    This patient is a 43 y.o. year female with Past Medical History of Prior Cholecystectomy, Wold-Parkinson White, Prior Hysterectomy who returns for follow up of Behcet's HLAB51+, recurrent oral ulcers, pathergy, low grade fevers and seronegative arthritis.  -ablation procedure for arrythmia 06/2022  -She is on Humria weekly for inflammatory arthritis from Behcets  -Otezla  for recurrent aphthous ulcers  -She is on dapsone from Dermatology, clindamycin  at night  Dermatologist started her on dapsone-she has had a few scars that are healing-had injections from her dermatologist.  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 Avelina Aspen      Interval History:  Last visit 10/24/23    She still has left ankle pain, swelling,  Mild pain.  More noticeable swelling than pain.    No other dactylitis or podagra.    She had injections in her low back.  She had bialteral injections and they were helpful.    She feels low back is 70-80% better.    She still has double vision issues.    She is going to see Dr. Rosalea in April.  She does not think she has had recurrence of uveitis.    She saw Dr. [name] 12/27/23.  He says cognitive study showed some normal things, and some abnormalities.  Waiting repeat labs before decdigin on increasing remicade  or increasing MTX.  Working ehr up for absence episodes.    She is on MTX 5 mg weekly, daily folic acid  1 mg.  She is also on Otezla  30 mg BID    She is on Otezla .  More mouth sores-possibly due to dryness(?)        Labs Reviewed:  12/12/23  CMP AST/ALT 45/74  ESR wnl  CRP wnl  Infliximab  level  13  CBC WBC 14; H/H 15.5/45.9      Previous Labs:  10/17/23  CMP ALT 69  ESR wnl  CRP wnl  CBC WBC 10        Imaging Reports Reviewed:  01/03/24  MRI left ankle.    Previous Imaging:  10/16/23  CT L Spine:  FINDINGS:   Scoliotic alignment is present. On sagittal imaging, there is no  significant listhesis. Vertebral body heights are maintained. There is no  fracture. There is no lytic or blastic lesion.     Endplate spurring is present throughout the lumbar spine. Varying degrees  of disc height loss are present. There is no obvious large disc bulge or  protrusion. Facet and spinous process hypertrophy noted.     The surrounding soft tissues are normal.     IMPRESSION:      1. No acute process, stable appearance.  2. No fracture.  3. Degenerative findings are present but there is no appreciable large disc  bulge or protrusion      Previous Labs:  08/22/23  CRP wnl  ESR wnl  CMP AST/ALT 02/08/81  Infliximab  Ab 14  Valproic Acid  9.0      Previous Labs:  06/26/23  Inflizimab level pending  Valproic Acid  level 75  CRP wnl  ESR  wnl  CMP AST/ALT 53/93    Previous Labs:  05/01/23  Infliximab  level wnl  CRP wnl  ESR 14  CMP wnl  CBC wnl    Previous Imaging:  07/25/23:  FINDINGS:     Right ankle/foot: No significant joint effusion or synovitis or marginal  erosion is noted in the right ankle, midfoot, or forefoot. No tenosynovitis  noted.     Left ankle/foot: No significant joint effusion, synovitis, or marginal  erosion is noted in the left ankle, midfoot, or forefoot. No tenosynovitis  noted.     IMPRESSION:         1.No joint effusion, synovitis, or obvious joint erosion in either right or  left ankle/foot    Previous Imaging:  03/01/23  MRI Brain:  FINDINGS:      The brain parenchyma demonstrates no significant abnormality. There is no  abnormal parenchymal enhancement. No parenchymal mass lesion, acute  parenchymal hemorrhage, or acute infarction.     The ventricles, sulci, and cisterns are unremarkable in appearance.     The  flow voids of the major intracranial vessels appear intact.     The bones and extracranial soft tissues are unremarkable.     IMPRESSION:      No intracranial mass, acute hemorrhage, acute infarction, or evidence of  hydrocephalus. Normal MRI of the brain    02/10/23:  Neck US :  Narrative & Impression   HISTORY: Neck swelling. Enlarged cervical lymph nodes.      COMPARISON: 01/28/2023     TECHNIQUE: Targeted soft tissue ultrasound of the area of concern, in the  area of both parotid glands and at level 1.     FINDINGS:   No enlarged or otherwise abnormal appearing lymph node, other mass, fluid  collection, or other focal abnormality. The parotid glands appear symmetric  and normal in echotexture with no discrete mass.     IMPRESSION:      Normal exam         Previous Labs:  03/21/23  Surgical pathology A.  GASTRIC BIOPSY:          MINIMAL SUPERFICIAL CHRONIC INACTIVE INFLAMMATION, OTHERWISE     UNREMARKABLE; H. PYLORI NOT IDENTIFIED ON H&E STAIN        B.  COLON RANDOM BIOPS:     UNREMARKABLE COLONIC MUCOSA     03/06/23  CRP wnl  ESR 35  CMP wnl  CBC unremarkable    Previous Labs:  02/06/23  CRP wnl  ESR 24  CMP wnl  CBC WBC 10.96    Previous Labs:  11/25/22  Procalcitonin wnl  CMP wnl  CBC unremarkable      Previous Labs:  08/29/22  CBC platelets 350  BMP unremarkable  ESR wnl  CRP wnl      Previous Imaging:  12/16/22  MRI Lumbar Spine:  IMPRESSION:         1.  No acute osseous or soft tissue abnormality.  2.  Normal MR appearance of the distal spinal cord, conus medullaris and  cauda equina.  3.  Rotatory lumbar levoscoliosis.  4.  Lumbar spondylosis and facet arthrosis without central stenosis or  nerve root compression. Please see full details and segmental analysis  above.    CT Chest Angio:  11/23/22  FINDINGS:      LINES/TUBES: None.     LUNGS: No consolidation, edema or mass. There is linear atelectasis at the  lung bases.  PLEURA: No pleural effusions or pneumothorax.  HEART: Not enlarged.  MEDIASTINUM: No  axillary, hilar or mediastinal lymphadenopathy.  PULMONARY ARTERIES: No acute pulmonary emboli.  AORTA: No aneurysm or dissection.     UPPER ABDOMEN: Hepatic steatosis. No acute abnormality.     BONES AND SOFT TISSUES: Unremarkable.     IMPRESSION:      No acute pulmonary embolism.    CT A/P  11/23/22:  FINDINGS:      LINES/TUBES: None.     LOWER THORAX: Bibasilar atelectasis.     LIVER/BILIARY TREE: The gallbladder is absent. There is hepatic steatosis.  No biliary dilation.  SPLEEN: No splenomegaly.  PANCREAS: No pancreatic mass or duct dilatation.     KIDNEYS/URETERS: No hydronephrosis, stones or solid mass lesions.  ADRENALS: No adrenal mass.  PELVIC ORGANS/BLADDER: No pelvic masses. The bladder is partially  distended.     PERITONEUM/RETROPERITONEUM: No free air or fluid.  LYMPH NODES: No lymphadenopathy.  VESSELS: No aortic aneurysm.     GI TRACT: No bowel wall thickening or dilation. Normal appendix.     BONES AND SOFT TISSUES: There are degenerative changes in the spine. No  suspicious or destructive osseous lesion.     IMPRESSION:      No acute abnormality.    Previous Imaging:      09/19/22:  ECHO:  Left Ventricle    The left ventricle is normal in size. There is normal left ventricular  geometry. Left ventricular systolic function is normal with an ejection  fraction by Biplane Method of Discs of  60 %.  3D LVEF 58%. Left ventricular  segmental wall motion is normal. Left ventricular diastolic filling parameters  are consistent with Grade I diastolic dysfunction (impaired relaxation  pattern).  Right Ventricle    The right ventricular cavity size is normal in size. Normal right  ventricular systolic function.    08/27/22  MRI left Hip:  IMPRESSION:      1.Trace left hip joint effusion without significant synovitis. Tear of the  left anterosuperior acetabular labrum. Otherwise unremarkable left hip  joint.     2.Mild to moderate left distal gluteus minimus tendinosis and mild to  moderate left greater  trochanteric bursitis with bursal edema. There is  mild left proximal hamstring tendinosis.      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     4.There has been interval enlargement of a left adnexal cyst since pelvic  ultrasound 03/18/2022, now measuring 3.3 cm. This could be further  evaluated with follow-up pelvic ultrasound.     5.Unchanged 3.8 cm cyst adjacent to the vaginal cuff post hysterectomy  since multiple prior exams favoring a benign etiology.      Previous Labs:  07/25/22  ESR 37  CRP wnl  CMP ALt 39  CBC wnl      Previous Imaging:  04/12/22  Cardiac MRI:  IMPRESSION:       1. No delayed gadolinium enhancement to suggest myocardial fibrosis or  scarring.  2. Normal right ventricular systolic function.  3. Qualitatively mild global left ventricular hypokinesis. Left ventricular  systolic function measures 32% and may be artifactually reduced secondary  to significant motion artifact.    Labs Reviewed:  04/19/22  Throat culture no B-hemolytic strep  POCT flu A/B  Abbot -      Previous Labs:  01/14/22  CMP wnl  CRP wnl  ViT D 31  ESR wnl  Lipid panel wnl  TSH wnl      08/18/21  CBC  wnl  CMP wnl  ESR wnl  CRP wnl    Previous Labs:  05/25/21  Quant Gold-  HepbSAb wnl  HepbSAg-  HepCAb-  HIV-        Previous Labs:  02/23/21  ESR wnl  CRP wnl  CMP ALT 42, AST normal    CRP wnl  ESR wnl  HLAB51 positive  IgE, IgA, IGG, IgM all wnl  CBC wnl    12/03/20  DIAGNOSIS:          A. Lesion, ulnar wrist, excision:     -Fibroadipose tissue and vasculature          B. Synovial tissue, biopsy:     -Fibroadipose tissue and synovium (see comment)          Comment:     Morphologic features typical of rheumatoid arthritis or infection are     not identified.    12/03/20  Bacterial Cultures: No Growth  Fungal Cultures: NG at one week  AFB: No growth    11/20/20  ESR 50  CRP wnl  IgG subclasses wnl  ANA comprehensive panel negative        Previous Labs:    [facility] Labs:  10/23/20  C-ANCA, P-ANCA, MPO, PR3 all  negative  Mitochondrial Ab-  ANA IFA-  Scl70-  Centromere-  RNP-  SMith-  TPPA Ab-      05/18/20  Ferritin 291  CMP AST/A:T 59/70  CBC elevated lymphocytes    02/06/20  MPO-  PR3-  SSA-  aCL IgM 14  aCL IgG, IgA -  Lupus anticoagulant -  B2IgG, IgM -  ACE wnl  CCP-  RF-  Vit D 31  TSH wnl  ESR wnl  CK wnl  C4 wnl  C3 wnl  CRP wnl    07/2019  ANA Screen-  LDH wnl    07/23/2018 lymph node biopsy:DIAGNOSIS:          Lymph node, deep right neck, excisional biopsy:     -Lymph node with non-specific reactive changes   Wound, AFB and Fungal cultures negative at this time    BONE MARROW 05/03/2019      BONE MARROW, LEFT POSTERIOR ILIAC CREST, CORE BIOPSY, CLOT SECTION,    ASPIRATE SMEAR AND TOUCH PREP:        - NORMOCELLULAR BONE MARROW (approximate50-60%) WITH TRILINEAGE HEMATOPOIESIS,  NORMAL MYELOID TO ERYTHROID RATIO AND ADEQUATE NUMBERS OF    MEGAKARYOCYTES WIT H UNREMARKABLE MORPHOLOGY        - A FEW SMALL REACTIVE LYMPHOID AGGREGATES IDENTIFIED, AND MILDLY  INCREASED NUMBERS OF EOSINOPHILS        - PENDING CYTOGENETIC STUDIES OF BONE MARROW SAMPLE  - Remain pending as of 05/14/2019.        COMMENT:    The concurrent flow cytometry performed on the bone marrow aspirate  sample shows no evidence of immunophenotypic abnormalities      Imaging Reviewed:    08/16/20  MRI Brain:IMPRESSION:      1. No intracranial mass, hemorrhage, or hydrocephalus is detected.  2. There is normal pattern of enhancement of the brain parenchyma and  meningeal surfaces.    9/18 MRI Abdomen:  IMPRESSION:      1. Diffuse hepatic steatosis. No suspicious focal hepatic lesions.  2. No biliary ductal dilatation or choledocholithiasis.    02/03/20  MRI Pelvis:  IMPRESSION:         Cystic structure at the vaginal cuff, flank by both ovaries not  significantly  changed in size compared to 2019. No abnormal internal  enhancement. This favors a benign process such as a peritoneal inclusion  cyst or a paraovarian cyst.    12/14/19 MRI R Hand:  IMPRESSION:      1.   No suspicious soft tissue mass, osseous mass, or masslike  enhancement in the right hand or wrist to correlate with the diffuse FDG  uptake on recent PET/CT.  2.  There is mild enhancing synovitis in the right wrist joint without  active joint erosion or bony destruction.  3.  A small dorsal ganglion cyst overlying the scapholunate interval.    12/06/19  Full Body PET Scan:    Hypermetabolic soft tissue mass in palmar R hand surface at base of the second digit-stable pulmonary nodulke 1.3 cm left lower lobe-recommend follow up Chest CT 3 months         The following sections were reviewed this encounter by the provider:   Tobacco  Allergies  Meds  Problems  Med Hx  Surg Hx  Fam Hx         PMH/PSH:  Past Medical History:   Diagnosis Date    Abnormal vision     reading glasses    Anxiety     Arrhythmia     WPW ablation in 2005, 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 Released from cardiology    Behcet's disease (CMS/HCC)     She sees Dr. Woodard    BMI 45.0-49.9, adult (CMS/HCC)     Convulsions (CMS/HCC) 01/2023    02/11/23 stable on medical    Diarrhea     Ear, nose and throat disorder     seasonal allergies    Fatigue     Bechet's    Fever 11/09/2022    Headache     migraine    Hx of ovarian cyst     Lower back pain     not severe    Meningitis 2011    Viral // treated and resolved    Pain in wrist 11/09/2022    Post-operative nausea and vomiting     Scoliosis     moderate// used a Brace at a young age    Uveitis     using eyedrops        Past Surgical History:   Procedure Laterality Date    ABLATION - PVC'S N/A 06/08/2022    Procedure: Ablation - PVC's;  Surgeon: Von Oh, MD;  Location: [facility];  Service: Cardiovascular;  Laterality: N/A;  SAME DAY DISCHARGE  CARTO NO TEE    ARTHROTOMY, WRIST Right 12/03/2020    Procedure: RIGHT UPPER EXTREMITY SYNOVIAL BIOPSY;  Surgeon: Bolling Oh, MD;  Location: [facility];  Service: Plastics;  Laterality: Right;    BIOPSY, LYMPH NODE N/A 07/23/2018    Procedure: BIOPSY, LYMPH NODE;   Surgeon: Gloris Wilkie FALCON, MD;  Location: [facility];  Service: ENT;  Laterality: N/A;  NECK DEEP LYMPH NODE BIOPSY    BONE MARROW BIOPSY  2020    led to behcet's dx    CARDIAC ABLATION  2005    [facility] for Wolff-Parkinson-White    CHOLECYSTECTOMY  2009    COLONOSCOPY, DIAGNOSTIC (SCREENING) N/A 03/21/2023    Procedure: COLONOSCOPY, DIAGNOSTIC (SCREENING);  Surgeon: Leontine Countryman, MD;  Location: [facility];  Service: Gastroenterology;  Laterality: N/A;    DILATION AND CURETTAGE OF UTERUS  2010    EGD N/A 03/21/2023    Procedure: EGD;  Surgeon: Leontine,  Lucienne, MD;  Location: [facility];  Service: Gastroenterology;  Laterality: N/A;    FINGER GANGLION CYST EXCISION  04/2018    GANGLION CYST EXCISION  2014    04/2018 cyst [facility] index finger-[facility]    HYSTERECTOMY  2012    HYSTEROSCOPY  2011    indication: Abnormal bleeding    LAPAROSCOPIC, LYSIS, ADHESIONS N/A 11/09/2022    Procedure: LAPAROSCOPIC LYSIS OF ADHESIONS;  Surgeon: Jan, Ambareen G, MD;  Location: [facility];  Service: Gynecology;  Laterality: N/A;    LAPAROSCOPIC, SALPINGECTOMY Left 11/09/2022    Procedure: LAPAROSCOPIC, SALPINGECTOMY LEFT;  Surgeon: Madison Sedalia MATSU, MD;  Location: [facility];  Service: Gynecology;  Laterality: Left;    LAPAROSCOPY, DIAGNOSTIC N/A 11/09/2022    Procedure: LAPAROSCOPY, DIAGNOSTIC;  Surgeon: Madison Sedalia MATSU, MD;  Location: [facility];  Service: Gynecology;  Laterality: N/A;  [id number]    UMBILICAL HERNIA REPAIR  2016    UPPER GASTROINTESTINAL ENDOSCOPY  2009    UPPER GASTROINTESTINAL ENDOSCOPY  03/2023    WISDOM TOOTH EXTRACTION  1998        FH/SH:  Family History   Adopted: Yes       Social History     Tobacco Use    Smoking status: Never     Passive exposure: Never    Smokeless tobacco: Never   Vaping Use    Vaping status: Never Used   Substance Use Topics    Alcohol use: Never    Drug use: Never        Meds/ Allergies:  No outpatient medications  have been marked as taking for the 01/22/24 encounter (Telemedicine Visit) with Woodard Geni HERO, MD.     Current Facility-Administered Medications for the 01/22/24 encounter (Telemedicine Visit) with Woodard Geni HERO, MD   Medication Dose Route Frequency Provider Last Rate Last Admin    onabotulinumtoxin A (BOTOX ) injection 175 Units  175 Units Intramuscular Q12 Weeks Kastl, Charlotte M, MD   175 Units at 08/16/23 1013     Allergies   Allergen Reactions    Fruit Blend Flavor [Flavoring Agent (Non-Screening)] Anaphylaxis and Swelling     Tropical Fruit     Latex Hives, Swelling, Other (See Comments) and Drug-Induced Flushing    Adhesive [Wound Dressing Adhesive] Itching    Cyanoacrylate Itching    Other Itching and Rash     Sutures Monocryl    Oxycodone Hallucinations          PHYSICAL EXAM  General- WNWD, alert and oriented, NAD  Eyes- EOMI, PERRL, no conjunctival injection, no scleral icterus  ENMT- face symmetric without lesions  Skin- no rash, no alopecia  Pulm: No conversational dyspnea, breathing comfortable on room air  Neuro - no notable neurological deficits, no facial asymmetry          LABS:  Lab Results   Component Value Date    WBC 14.28 (H) 12/12/2023    HGB 15.5 (H) 12/12/2023    HCT 45.9 (H) 12/12/2023    MCV 99.1 (H) 12/12/2023    PLT 325 12/12/2023      Lab Results   Component Value Date    CREAT 0.8 12/12/2023    BUN 13 12/12/2023    NA 139 12/12/2023    K 3.9 12/12/2023    CL 102 12/12/2023    CO2 27 12/12/2023      Lab Results   Component Value Date    ALT 74 (H) 12/12/2023  AST 45 (H) 12/12/2023    ALKPHOS 48 12/12/2023    BILITOTAL 0.5 12/12/2023     Lab Results   Component Value Date    ESR 5 12/12/2023    ESR 3 10/17/2023    ESR 2 08/22/2023    ESR 8 06/26/2023    ESR 14 05/01/2023      Lab Results   Component Value Date    CRP <0.1 12/12/2023    CRP <0.1 10/17/2023    CRP <0.1 08/22/2023    CRP <0.1 06/26/2023    CRP <0.1 05/01/2023      No results found for: ANA   Lab Results   Component  Value Date    RHEUMFACTOR <15.0 02/06/2020      Lab Results   Component Value Date    CCP <15.6 02/06/2020     No results found for: URICACID        ASSESSMENT/PLAN:    This patient is a 43 y.o. year female with Past Medical History of Prior Cholecystectomy, Wolf-Parkinson White, Prior Hysterectomy, Fatty Liver Disease who returns for follow up of Behcet's Disease characterized by seronegative rheumatoid arthritis complicated with recent uveitis for which she was switched to Remicade  from humira .    She will continue Remicade -on 5 mg/kg and will maintain on this for now.  Reportedly last ophtho visit with well controlled uveitis.  Some anti remicade  ab on last lab check-repeating with next round of labs.    She is on very low dose MTX 5 tabs weekly to prevent development of anti drug TNF antibodies.  Would ideally like to increase to 10 mg weekly, though she has mild, stable increase in LFTs suspect related to polypharmacy.  Will consider increase in dose at next set of labs.  Continue Otezla  for oral ulcers, inflammatory arthropathy.    Neuro behcets management, neurologic symptoms and seizure disorder management per Dr. [name].      MRI left ankle with peroneal tendon and psoterior tibial tendon tenosynovitis.  Possibly due to underlying Behcets but isolated and mild.  She is on prednisone  10 mg daily already, would be unutual, Remicae and low dose MTX.  Recommend evaluation with foot/ankle specialsit.  Can consider steroid injection, synovial biopsy    She will eventually need baseline pulmonary evaluation and PFTs-prolonged recovery from pneumonia, rule out obstructive disease.        1. Behcet's disease (CMS/HCC)  - folic acid  (FOLVITE ) 1 MG tablet; Take 1 tablet (1,000 mcg) by mouth daily  Dispense: 90 tablet; Refill: 1  - methotrexate  2.5 MG tablet; Take 2 tablets (5 mg) by mouth once a week  Dispense: 24 tablet; Refill: 1  - Comprehensive Metabolic Panel; Future  - CBC with Differential (Order); Future  -  Sedimentation Rate (ESR); Future  - C Reactive Protein; Future    2. High risk medication use  - folic acid  (FOLVITE ) 1 MG tablet; Take 1 tablet (1,000 mcg) by mouth daily  Dispense: 90 tablet; Refill: 1  - methotrexate  2.5 MG tablet; Take 2 tablets (5 mg) by mouth once a week  Dispense: 24 tablet; Refill: 1  - Comprehensive Metabolic Panel; Future  - CBC with Differential (Order); Future  - Sedimentation Rate (ESR); Future  - C Reactive Protein; Future    3. Seronegative rheumatoid arthritis (CMS/HCC)  - folic acid  (FOLVITE ) 1 MG tablet; Take 1 tablet (1,000 mcg) by mouth daily  Dispense: 90 tablet; Refill: 1  - methotrexate  2.5 MG tablet; Take 2 tablets (5  mg) by mouth once a week  Dispense: 24 tablet; Refill: 1  - Comprehensive Metabolic Panel; Future  - CBC with Differential (Order); Future  - Sedimentation Rate (ESR); Future  - C Reactive Protein; Future    4. Uveitis  - folic acid  (FOLVITE ) 1 MG tablet; Take 1 tablet (1,000 mcg) by mouth daily  Dispense: 90 tablet; Refill: 1  - methotrexate  2.5 MG tablet; Take 2 tablets (5 mg) by mouth once a week  Dispense: 24 tablet; Refill: 1  - Comprehensive Metabolic Panel; Future  - CBC with Differential (Order); Future  - Sedimentation Rate (ESR); Future  - C Reactive Protein; Future                                       Total Time spent reviewing records, interviewing patient and coordinating plan of care: 26 min    FOLLOW UP:  Return in about 6 weeks (around 03/04/2024) for 6-8 weeks video 40 minutes.                                                                                  Geni Derby, MD  Rheumatologist  [facility]  [address]  [place], [place] [address]  Phone: [phone]  Fax: [phone]

## 2024-01-22 NOTE — Patient Instructions (Addendum)
 Dear Hailey [name] ,     Thanks for arranging a video visit with me.    Here are things I'd like you to do following today's visit:  Please have blood taken for labs.  Medication instructions:  -Please cotninue taking Methtorexate 5 tablets weekly  -Please continue taking folic acid  1 mg daily  -please continue taking Otezla  30 mg twice daily  -Please continue infliximab  infusions    You may retrieve any orders from this visit in Mychart by using these steps:    After signing into Mychart  1) click Messaging  2) click Letters  3) select the Requisition  4) Review and click print    Please contact the clinic at [phone] with any questions and to schedule your follow up visit.    Sincerely,    Geni Derby, MD  Rheumatologist  [facility]  [address]  [place], [place] [address]  Phone: [phone]  Fax: [phone]

## 2024-01-24 ENCOUNTER — Other Ambulatory Visit: Payer: Self-pay

## 2024-01-25 ENCOUNTER — Other Ambulatory Visit: Payer: Self-pay

## 2024-01-25 ENCOUNTER — Encounter: Payer: Self-pay | Admitting: Internal Medicine

## 2024-01-25 ENCOUNTER — Ambulatory Visit: Payer: BLUE CROSS/BLUE SHIELD

## 2024-02-01 ENCOUNTER — Ambulatory Visit: Payer: BLUE CROSS/BLUE SHIELD

## 2024-02-03 ENCOUNTER — Other Ambulatory Visit: Payer: Self-pay

## 2024-02-06 ENCOUNTER — Ambulatory Visit: Payer: BLUE CROSS/BLUE SHIELD | Attending: Anatomic and Clinical Pathology

## 2024-02-06 ENCOUNTER — Encounter: Payer: Self-pay | Admitting: Internal Medicine

## 2024-02-06 ENCOUNTER — Other Ambulatory Visit (INDEPENDENT_AMBULATORY_CARE_PROVIDER_SITE_OTHER): Payer: BLUE CROSS/BLUE SHIELD

## 2024-02-06 ENCOUNTER — Other Ambulatory Visit (INDEPENDENT_AMBULATORY_CARE_PROVIDER_SITE_OTHER): Payer: Self-pay | Admitting: Internal Medicine

## 2024-02-06 VITALS — BP 129/83 | HR 91 | Temp 97.9°F | Wt 317.2 lb

## 2024-02-06 DIAGNOSIS — H209 Unspecified iridocyclitis: Secondary | ICD-10-CM

## 2024-02-06 DIAGNOSIS — M352 Behcet's disease: Secondary | ICD-10-CM | POA: Insufficient documentation

## 2024-02-06 DIAGNOSIS — D84821 Immunodeficiency due to drugs: Secondary | ICD-10-CM

## 2024-02-06 DIAGNOSIS — Z79899 Other long term (current) drug therapy: Secondary | ICD-10-CM

## 2024-02-06 DIAGNOSIS — R569 Unspecified convulsions: Secondary | ICD-10-CM

## 2024-02-06 DIAGNOSIS — M0600 Rheumatoid arthritis without rheumatoid factor, unspecified site: Secondary | ICD-10-CM | POA: Insufficient documentation

## 2024-02-06 DIAGNOSIS — G43711 Chronic migraine without aura, intractable, with status migrainosus: Secondary | ICD-10-CM

## 2024-02-06 DIAGNOSIS — H532 Diplopia: Secondary | ICD-10-CM

## 2024-02-06 LAB — LAB USE ONLY - CBC WITH DIFFERENTIAL
Absolute Basophils: 0.04 10*3/uL (ref 0.00–0.08)
Absolute Eosinophils: 0.14 10*3/uL (ref 0.00–0.44)
Absolute Immature Granulocytes: 0.08 10*3/uL — ABNORMAL HIGH (ref 0.00–0.07)
Absolute Lymphocytes: 4.96 10*3/uL — ABNORMAL HIGH (ref 0.42–3.22)
Absolute Monocytes: 0.86 10*3/uL — ABNORMAL HIGH (ref 0.21–0.85)
Absolute Neutrophils: 5.11 10*3/uL (ref 1.10–6.33)
Absolute nRBC: 0 10*3/uL (ref <=0.00)
Basophils %: 0.4 %
Eosinophils %: 1.3 %
Hematocrit: 44.3 % — ABNORMAL HIGH (ref 34.7–43.7)
Hemoglobin: 15.2 g/dL — ABNORMAL HIGH (ref 11.4–14.8)
Immature Granulocytes %: 0.7 %
Lymphocytes %: 44.3 %
MCH: 34.3 pg — ABNORMAL HIGH (ref 25.1–33.5)
MCHC: 34.3 g/dL (ref 31.5–35.8)
MCV: 100 fL — ABNORMAL HIGH (ref 78.0–96.0)
MPV: 8.4 fL — ABNORMAL LOW (ref 8.9–12.5)
Monocytes %: 7.7 %
Neutrophils %: 45.6 %
Platelet Count: 285 10*3/uL (ref 142–346)
Preliminary Absolute Neutrophil Count: 5.11 10*3/uL (ref 1.10–6.33)
RBC: 4.43 10*6/uL (ref 3.90–5.10)
RDW: 12 % (ref 11–15)
WBC: 11.19 10*3/uL — ABNORMAL HIGH (ref 3.10–9.50)
nRBC %: 0 /100{WBCs} (ref <=0.0)

## 2024-02-06 LAB — COMPREHENSIVE METABOLIC PANEL
ALT: 37 U/L (ref <=55)
AST (SGOT): 27 U/L (ref <=41)
Albumin/Globulin Ratio: 1.2 (ref 0.9–2.2)
Albumin: 3.8 g/dL (ref 3.5–5.0)
Alkaline Phosphatase: 43 U/L (ref 37–117)
Anion Gap: 12 (ref 5.0–15.0)
BUN: 12 mg/dL (ref 7–21)
Bilirubin, Total: 0.4 mg/dL (ref 0.2–1.2)
CO2: 23 meq/L (ref 17–29)
Calcium: 9.7 mg/dL (ref 8.5–10.5)
Chloride: 107 meq/L (ref 99–111)
Creatinine: 0.9 mg/dL (ref 0.4–1.0)
GFR: >60 mL/min/{1.73_m2} (ref >=60.0)
Globulin: 3.3 g/dL (ref 2.0–3.6)
Glucose: 80 mg/dL (ref 70–100)
Hemolysis Index: 18 {index}
Potassium: 4.9 meq/L (ref 3.5–5.3)
Protein, Total: 7.1 g/dL (ref 6.0–8.3)
Sodium: 142 meq/L (ref 135–145)

## 2024-02-06 LAB — SEDIMENTATION RATE: Sed Rate: 3 mm/h (ref <=20)

## 2024-02-06 LAB — C-REACTIVE PROTEIN: C-Reactive Protein: <0.1 mg/dL (ref 0.0–1.1)

## 2024-02-06 MED ORDER — METHYLPREDNISOLONE SODIUM SUCC 40 MG IJ SOLR (WRAP)
40.0000 mg | Freq: Once | INTRAMUSCULAR | Status: AC
Start: 2024-02-06 — End: 2024-02-06
  Administered 2024-02-06: 40 mg via INTRAVENOUS
  Filled 2024-02-06: qty 1

## 2024-02-06 MED ORDER — DIPHENHYDRAMINE HCL 50 MG/ML IJ SOLN
25.0000 mg | Freq: Once | INTRAMUSCULAR | Status: AC
Start: 2024-02-06 — End: 2024-02-06
  Administered 2024-02-06: 25 mg via INTRAVENOUS
  Filled 2024-02-06: qty 1

## 2024-02-06 MED ORDER — SODIUM CHLORIDE 0.9 % IV SOLN
700.0000 mg | Freq: Once | INTRAVENOUS | Status: AC
Start: 2024-02-06 — End: 2024-02-06
  Administered 2024-02-06: 700 mg via INTRAVENOUS
  Filled 2024-02-06: qty 700

## 2024-02-06 MED ORDER — SODIUM CHLORIDE 0.9 % IV SOLN
250.0000 mL | INTRAVENOUS | Status: DC
Start: 2024-02-06 — End: 2024-02-06
  Administered 2024-02-06: 250 mL via INTRAVENOUS
  Filled 2024-02-06: qty 250

## 2024-02-06 MED ORDER — ACETAMINOPHEN 325 MG PO TABS
650.0000 mg | ORAL_TABLET | Freq: Once | ORAL | Status: AC
Start: 2024-02-06 — End: 2024-02-06
  Administered 2024-02-06: 650 mg via ORAL
  Filled 2024-02-06: qty 2

## 2024-02-06 NOTE — Progress Notes (Signed)
 Time: 1230    Hailey [name] is a 43 y.o. female here for Infliximab  infusion.  Pt reports fatigue day after infusion, nausea managed with Zofran  and left ankle swelling (f/u with ortho).    Pre-med :  Tylenol /Benadryl /Solumedrol    Infliximab  infused over 120 min without incident.    PIV +BBR prior to removal, flushed and removed without issue, catheter intact. Occlusive dressing applied to site.    See MAR, VS, Doc Flowsheets for more details.  Pt tolerated the infusion well without problems.    Pt discharged stable, NAD.    RTC 04/02/2024    Drake Shines, RN     - MAR ACTION REPORT  (last 24 hrs)           SHINES DRAKE, RN         Medication Name Action Time Action Site Route Rate Dose Reason Comments User     0.9% NaCl infusion 02/06/24 1236 New Bag  Intravenous 20 mL/hr 250 mL   Deanna Wiater, RN     0.9% NaCl infusion 02/06/24 1540 Stopped  Intravenous     Vibhav Waddill, RN     acetaminophen  (TYLENOL ) tablet 650 mg 02/06/24 1233 Given  Oral  650 mg   Kenzi Bardwell, RN     diphenhydrAMINE  (BENADRYL ) injection 25 mg 02/06/24 1236 Given  Intravenous  25 mg   Jaydien Panepinto, RN     inFLIXimab -axxq (AVSOLA ) 700 mg in sodium chloride  0.9 % 250 mL infusion 02/06/24 1335 New Bag  Intravenous 125 mL/hr 700 mg   Whitney Bingaman, RN     inFLIXimab -axxq (AVSOLA ) 700 mg in sodium chloride  0.9 % 250 mL infusion 02/06/24 1535 Stopped  Intravenous     Tawn Fitzner, RN     methylPREDNISolone  sodium succinate (Solu-MEDROL ) injection 40 mg 02/06/24 1240 Given  Intravenous  40 mg   Shelton Square, RN                    Vitals:    02/06/24 1200 02/06/24 1549   BP: (!) 137/93 129/83   Pulse: 90 91   Temp: 97.9 F (36.6 C)    TempSrc: Oral    SpO2: 98% 95%   Weight: (!) 143.9 kg (317 lb 3.2 oz)

## 2024-02-06 NOTE — Progress Notes (Signed)
 Signed therapy plan orders

## 2024-02-06 NOTE — Progress Notes (Signed)
 Signed again for infusion center

## 2024-02-08 ENCOUNTER — Ambulatory Visit: Payer: BLUE CROSS/BLUE SHIELD

## 2024-02-08 LAB — VALPROIC ACID LEVEL, TOTAL AND FREE
Valproic Acid Free: 7.9 mg/L (ref 4.8–17.3)
Valproic Acid Level: 80.2 mg/L (ref 50.0–100.0)

## 2024-02-09 ENCOUNTER — Other Ambulatory Visit: Payer: Self-pay

## 2024-02-12 LAB — INFLIXIMAB LEVEL AND ANTI-DRUG ANTIBODY FOR IBD
Infliximab Antibody: <10 [AU] (ref <10)
Infliximab Level: 4.8 ug/mL

## 2024-02-13 ENCOUNTER — Ambulatory Visit: Payer: BLUE CROSS/BLUE SHIELD | Admitting: No Specialty

## 2024-02-15 ENCOUNTER — Ambulatory Visit: Payer: BLUE CROSS/BLUE SHIELD

## 2024-02-19 ENCOUNTER — Encounter (INDEPENDENT_AMBULATORY_CARE_PROVIDER_SITE_OTHER): Payer: Self-pay | Admitting: Family Medicine

## 2024-02-19 ENCOUNTER — Encounter: Payer: Self-pay | Admitting: Neurology

## 2024-02-19 ENCOUNTER — Encounter (INDEPENDENT_AMBULATORY_CARE_PROVIDER_SITE_OTHER): Payer: Self-pay | Admitting: Internal Medicine

## 2024-02-22 ENCOUNTER — Ambulatory Visit: Payer: BLUE CROSS/BLUE SHIELD | Attending: Neurology

## 2024-02-22 ENCOUNTER — Encounter (INDEPENDENT_AMBULATORY_CARE_PROVIDER_SITE_OTHER): Payer: Self-pay | Admitting: Family Medicine

## 2024-02-22 ENCOUNTER — Other Ambulatory Visit: Payer: Self-pay

## 2024-02-22 ENCOUNTER — Telehealth (INDEPENDENT_AMBULATORY_CARE_PROVIDER_SITE_OTHER): Payer: BLUE CROSS/BLUE SHIELD | Admitting: Family Medicine

## 2024-02-22 DIAGNOSIS — M352 Behcet's disease: Secondary | ICD-10-CM

## 2024-02-22 DIAGNOSIS — R41840 Attention and concentration deficit: Secondary | ICD-10-CM | POA: Insufficient documentation

## 2024-02-22 DIAGNOSIS — R41844 Frontal lobe and executive function deficit: Secondary | ICD-10-CM | POA: Insufficient documentation

## 2024-02-22 DIAGNOSIS — Z6841 Body Mass Index (BMI) 40.0 and over, adult: Secondary | ICD-10-CM

## 2024-02-22 DIAGNOSIS — M0600 Rheumatoid arthritis without rheumatoid factor, unspecified site: Secondary | ICD-10-CM

## 2024-02-22 DIAGNOSIS — I428 Other cardiomyopathies: Secondary | ICD-10-CM

## 2024-02-22 DIAGNOSIS — D84821 Immunodeficiency due to drugs: Secondary | ICD-10-CM

## 2024-02-22 DIAGNOSIS — R41841 Cognitive communication deficit: Secondary | ICD-10-CM | POA: Insufficient documentation

## 2024-02-22 DIAGNOSIS — R413 Other amnesia: Secondary | ICD-10-CM | POA: Insufficient documentation

## 2024-02-22 DIAGNOSIS — Z79899 Other long term (current) drug therapy: Secondary | ICD-10-CM

## 2024-02-22 NOTE — SLP Plan of Care Note (Signed)
 [facility]  [address]  [place], [place]  [address]  Phone:  [phone]  Fax:  [phone]    SPEECH THERAPY DAILY TREATMENT NOTE AND UPDATED PLAN OF CARE    PATIENT: Hailey [name] DOB: [birth year]   MR #: [id number]  AGE: 43 y.o.    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];Memory loss [R41.3]  TREATMENT DIAGNOSIS: Executive function deficit R41.844 Memory Deficits R41.3 Attention Deficits R41.840 Cognitive-Communication Deficit R41.841     Date of Service SLP Received On: 02/22/24   Treatment Time Start Time: 1055 to Stop Time: 1155   Time Calculation Time Calculation (min): 60 min   Visit # SLP Visit Number: 8   Units Billed   SLP Treatments  $ SLP Treatment Indiv [phone]): 1 Procedure     Medications:  has a current medication list which includes the following prescription(s): atogepant , atomoxetine , botulinum toxin type a , buspirone , clindamycin , colesevelam , cyclobenzaprine , dapsone, dicyclomine , divalproex  (er) extended release, dorzolamide-timolol, eletriptan , epinephrine , folic acid , hydroxyzine , infliximab , lacosamide , lidocaine -prilocaine, melatonin-pyridoxine, methocarbamol , methotrexate , multiple vitamin, omeprazole , ondansetron , otezla , prednisone , rimegepant sulfate , valacyclovir , cyanocobalamin , and vitamin d , and the following Facility-Administered Medications: onabotulinumtoxin a.  Patient/Caregiver does not report changes to medication at this time.    Precautions:   Seizures - Most recent seizure: December 16, 2023                    Rescue Plan: No rescue plan, typically come out of it after 90 seconds                    At times a sense of deja vu and then unresponsive      Allergies:  Allergies[1]    SUBJECTIVE REPORT:  Patient presents for speech therapy today alone. Pt reports more difficulty with taking medications, forgetting to take afternoon pills. Neurology appointment on Monday.     Pain:    6-7 on a scale of 1 - 10; location: SI joints      OBJECTIVE FINDINGS:  Interventions / Goal Status:  Goals copied from last visit:    Short Term Goals:  To be met by 6 weeks from 11/30/2023  Pt will demonstrate use of attention and executive strategies (self-talk, go slow, double-check, etc.) during complex structured tasks with 90% acc when provided with priming for strategies in order to increase familiarity with attention and executive strategies for use in functional tasks.   GOAL STATUS: PROGRESSING  Attention and executive strategies have not been utilized in structured tasks this certification period, but pt has demonstrated knowledge of strategies through verbalization of strategies and practicing use of strategies during functional tasks at home. Despite a lack of accuracy noted with strategy use in structured tasks, pt is progressing towards goal. This goal will be modified to better reflect knowledge of strategies and use in activities of daily living and functional tasks at home and in the community.   Pt will monitor for word choice, verbal organization, relevance and clarity with appropriate self-corrections when provided with no more than 2 cues from clinician in order to accurately communicate information within structured language tasks (i.e. summarizing written information, answering question, describing event, etc.) in 4/5 opportunities in order to  improve efficient and effective communication with spouse, friends, and medical professionals.    GOAL STATUS: MET  Pt monitors for word choice, verbal organization, relevance and clarity with appropriate self corrections when provided with 0-1 cue from SLP when describing events, medical appointments, answering questions,  etc. in 4/5 opportunities.    Pt will utilize word finding strategies during structured language tasks and conversation in 4-5/5 opportunities when provided with min cues in order to improve efficient communication with spouse, friends, and medical professionals.    GOAL STATUS: NOT ADDRESSED THIS CERTIFICATION PERIOD  Pt will utilize attentive reading and constrained summarization when reading short paragraphs (3-5 sentences) of material relevant to their interests and daily life (personal letters, news, magazine articles, etc.) and answer questions regarding material with 90% acc when provided with min cues across 2 sessions to improve reading comprehension.    GOAL STATUS: NOT ADDRESSED THIS CERTIFICATION PERIOD     Long Term Goals:  To be met by 12 weeks from 11/30/2023  Pt will utilize executive function and internal/external memory strategies to plan/recall/complete functional and professional tasks when provided with priming for strategy use at least 5/7 days per week, based on pt report.   GOAL STATUS: PROGRESSING  Pt will report improved cognitive communication skills demonstrated by an increased score on patient reported outcome measure (PROMIS Item Bank v2.0 - Cognitive Function - Short Form 8a) from a baseline score of 16/40.   GOAL STATUS: NOT ADDRESSED THIS CERTIFICATION PERIOD  Pt will demonstrate effective processing speed and complex attention as evidenced by scoring WNL on all subtests of the Attention Processing Training Test (APT).    GOAL STATUS: MET     GOAL # INTERVENTIONS PROGRESS   STG 1 Cognitive Pacing Scale    Attention strategies - Dedicated time for tasks, break tasks down, identify and manage distractions, etc. PROGRESSING:    Pt able to verbalize at least 2 attention strategies independently; not utilized in structured tasks.     STG 2  MET.    Pt monitored for word choice, verbal organization, relevance, and clarity in 4/5 opportunities when provided with 1 cues when conversing with  clinician about events, medical appointments, and answering questions.     STG 3  NOT ADDRESSED TODAY.   STG 4  NOT ADDRESSED TODAY.        LTG 1  PROGRESSING:    Pt utilizing pillbox and notifications on phone to take medications in 4-5/7 days of the week.    Pt utilizing calendar to recall appointments in 5/7 days of the week.    LTG 2  NOT ADDRESSED TODAY.   LTG 3  MET.    Attention Process Training Test  Sustained Attention: 26/30 (normative mean: 29.1 +/- 0.4)  Complex Sustained Attention: 18/30 (normative mean: 19.5 +/- 5.4)  Selective Attention: 14/30 (normative mean: 22.3 +/- 6.6)  Divided Attention: 13/30 (normative mean: 27.6 +/- 5.7)  Alternating Attention: 6/24 (normative mean: 16.8 +/- 3.9)     Education/Home Exercise Program:    Patient was educated on strategies and techniques used in treatment session.  Internal memory strategies introduced via handout and verbal explanation.  Processing strategies introduced verbally.     PROGRESS SUMMARY:  Pt has completed a total of 8 SLP visits making steady progress towards short and long term goals, with clinician availability likely impacting progress as pt has not been seen in 4 weeks. This certification period has focused on training cognitive pacing strategies, processing strategies, attention and executive  function strategies, and internal/external memory strategies to support pt in functional tasks in the home. Pt is able to verbalize attention strategies and has started to utilize in functional tasks. Pt and SLP have collaborated on additional external memory strategies to support patient in daily activities including medication management; this will also be reflected as a new goal below. The Attention Processing Training Test was completed revealing difficulties in sustained attention, selective attention, divided attention, and alternating attention. Goals have been modified to better reflect pt's functional goals. Pt continues to warrant skilled SLP  intervention 1x/wk to train cognitive linguistic strategies and improve independence in the home.     ASSESSMENT: Hailey [name] is a 43 y.o. female presents to outpatient therapy today with mild cognitive communication impairments, mild memory impairments, and mild executive function impairments. These deficits impact the pt's ability to complete functional tasks in the home such as cooking, organizing a list, reading complex material, and recalling medical information. Pt warrants skilled SLP intervention to address deficits and train cognitive linguistic compensatory strategies for increased independence in functional tasks.     The following impairments were noted:  Mild cognitive communication impairment, as characterized by deficits in word finding in conversation, phonemic fluency, comprehension of basic and complex written material, and problem solving.  Mild memory impairment, as characterized by impairments in delayed recall of new information.   Mild executive function and attention impairment, as characterized by impairments in sustained attention, alternating/divided attention, and planning/organizing.      Patient presents with the following functional limitations:  Patient has difficulty with communicating at the level of conversation.  Unable to effectively implement attention / memory strategies for sustained attention, alternating/divided attention, and recall of new information.  Difficulty with executive function skills such as  problem solving and planning/organizing.     Without intervention, patient is at risk for:  Decreased social interactions that may lead to further cognitive decline and contribute to further medical complications and increased fall risk.  Inability to hold current job position and loss of income.  Burden of care.  Depression.    Patient is progressing well toward above mentioned functional goals.    PLAN:   It is medically necessary for Claudeen Crutch to  recieve skilled speech therapy intervention in order to address the above impairments and functional limitations.    Recommend continue per plan of care to work toward increased cognitive and communication abilities for safety and independence with daily activities and in the community.    NEW GOALS:  Short Term Goals:  To be met by 6 weeks from 02/22/2024   Pt will verbalize at least 3 attention strategies and describe at least 2 functional situations across a 7 week period in which she used attention strategies during functional tasks when provided with priming for strategy use in order to improve attention during activities of daily living.  Pt will monitor for word choice, verbal organization, relevance and clarity with appropriate self-corrections when provided with no more than 2 cues from clinician in order to accurately communicate information within structured language tasks (i.e. summarizing written information, answering question, describing event, etc.) in 4/5 opportunities in order to improve efficient and effective communication with spouse, friends, and medical professionals.   Pt will utilize word finding strategies during structured language tasks and conversation in 4/5 opportunities when provided with min cues in order to improve efficient communication with spouse, friends, and medical professionals.   Pt will utilize attentive reading and constrained summarization when  reading short paragraphs (3-5 sentences) of material relevant to their interests and daily life (personal letters, news, magazine articles, etc.) and answer questions regarding material with 90% acc when provided with min cues across 2 sessions to improve reading comprehension.     Long Term Goals:  To be met by 12 weeks from 02/22/2024  Pt will report improved cognitive communication skills demonstrated by an increased score on patient reported outcome measure (PROMIS Item Bank v2.0 - Cognitive Function - Short Form 8a) from a  baseline score of 16/40.  Pt will independently utilize external memory support (e.g. alarms on phone) for recall of medication management in 7/7 days of the week across 2 consecutive weeks based on pt report in order to decrease memory lapses when taking medications.  Pt will verbalize at least 2 processing strategies (i.e. requesting clarification, requesting repetition, paraphrasing information, etc.) and describe at least 2 situation where strategies were utilized during functional tasks across a 7 day period (e.g. medical appointments, phone calls, conversation with husband), based on pt report.     NEXT VISIT: Focus on internal and external memory strategies, review processing strategies, introduce word finding strategies     Therapist Signature:    Cherylynn Cosier M.A., CCC-SLP  Speech Language Pathologist  [facility]  [email or web address]    02/22/2024         [1]   Allergies  Allergen Reactions    Fruit Blend Flavor [Flavoring Agent (Non-Screening)] Anaphylaxis and Swelling     Tropical Fruit     Latex Hives, Swelling, Other (See Comments) and Drug-Induced Flushing    Adhesive [Wound Dressing Adhesive] Itching    Cyanoacrylate Itching    Other Itching and Rash     Sutures Monocryl    Oxycodone Hallucinations

## 2024-02-22 NOTE — Progress Notes (Signed)
 [facility]     Telehealth:  The Patient has given verbal consent for delivery of health care via telehealth.   The patient is located at Home in [place]   The encounter provider is located at their Home in [employer] Video Client was utilized for Real Time/Synchronous Telehealth.   The time spent in medical discussion during this visit was 15 minutes.        Chief Complaint   Patient presents with    Letter for School/Work     Subjective     History of Present Illness  Patient presents to have letter written for work to apply for disability related to her chronic health issues.    Review of Systems   Constitutional:  Negative for chills and fever.   Respiratory:  Negative for cough, shortness of breath and wheezing.    Cardiovascular:  Negative for chest pain and leg swelling.   Gastrointestinal:  Negative for abdominal pain, diarrhea, nausea and vomiting.   Neurological:  Negative for weakness, light-headedness, numbness and headaches.       Objective   LMP  (LMP Unknown)   Physical Exam  Nursing note reviewed.   Constitutional:       Appearance: Normal appearance.   HENT:      Mouth/Throat:      Mouth: Mucous membranes are moist.   Eyes:      Extraocular Movements: Extraocular movements intact.   Pulmonary:      Effort: Pulmonary effort is normal.   Neurological:      Mental Status: She is alert.   Psychiatric:         Mood and Affect: Mood normal.       Physical Exam       Results        Assessment/Plan     Assessment & Plan  Behcet recurrent disease (CMS/HCC)         Immunocompromised state due to drug therapy         NICM (nonischemic cardiomyopathy) (CMS/HCC)         Body mass index (BMI) 50.0-59.9, adult (CMS/HCC)         Seronegative rheumatoid arthritis (CMS/HCC)         A total of 30 minutes was spent on this visit reviewing previous notes, preparing letter, counseling the patient and/or family members regarding the patient's condition(s), ordering of tests, managing medications,  and documenting the findings in the note.     Verbal consent obtained to record this visit.

## 2024-02-23 ENCOUNTER — Telehealth: Payer: Self-pay

## 2024-02-26 ENCOUNTER — Other Ambulatory Visit: Payer: Self-pay

## 2024-02-26 ENCOUNTER — Ambulatory Visit: Payer: BLUE CROSS/BLUE SHIELD | Attending: Neurology | Admitting: Neurology

## 2024-02-26 ENCOUNTER — Encounter: Payer: Self-pay | Admitting: Neurology

## 2024-02-26 VITALS — BP 146/107 | HR 129 | Ht 68.0 in

## 2024-02-26 DIAGNOSIS — G43711 Chronic migraine without aura, intractable, with status migrainosus: Secondary | ICD-10-CM

## 2024-02-26 DIAGNOSIS — R413 Other amnesia: Secondary | ICD-10-CM

## 2024-02-26 DIAGNOSIS — R569 Unspecified convulsions: Secondary | ICD-10-CM

## 2024-02-26 DIAGNOSIS — G43709 Chronic migraine without aura, not intractable, without status migrainosus: Secondary | ICD-10-CM

## 2024-02-26 DIAGNOSIS — M352 Behcet's disease: Secondary | ICD-10-CM

## 2024-02-26 DIAGNOSIS — H532 Diplopia: Secondary | ICD-10-CM

## 2024-02-26 MED ORDER — ATOMOXETINE HCL 100 MG PO CAPS
100.0000 mg | ORAL_CAPSULE | Freq: Every morning | ORAL | 3 refills | Status: DC
Start: 2024-02-26 — End: 2024-06-10

## 2024-02-26 MED FILL — Rimegepant Sulfate Tab Disint 75 MG: ORAL | 30 days supply | Qty: 16 | Fill #1 | Status: AC

## 2024-02-26 MED FILL — Atogepant Tab 60 MG: ORAL | 30 days supply | Qty: 30 | Fill #1 | Status: AC

## 2024-02-26 NOTE — Progress Notes (Signed)
 [place] [facility]  MULTIPLE SCLEROSIS & NEUROIMMUNOLOGY CENTER  Telephone: [phone]  Fax: [phone]  Send us  a mychart or inbasket message for the fastest response  ~~~~~~~~~~~~~~~~~~~~~~~~~~~~~~~~~~~~~~~~~~~~~~~~~~~~~~~~~~~~~~~~~~~~~~~~~~~~~~~~~~~~  Visit Date: 02/26/2024    Referring Neurologist:     CC: Bechet's; neuro Behcet's    HPI:   History was obtained from records review, patient,     43 y.o. year-old female     43 y.o. 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 Main neuro c/o brain fog, cognitive difficulty    Atomoxetine  helps with energy. Her cognitive difficulties persist. She has started SLP therapy at [facility].    She continues to have episodes of decreased awareness, seizure-like episodes, but decreasing in episode length (severity) but occurs every 1-2 weeks; lasts for 1-2 minutes.     Sleep is relatively ok.       EXAM  Visit Vitals  BP (!) 146/107 (BP Site: Left arm, Patient Position: Sitting, Cuff Size: Large)   Pulse (!) 129   Ht 1.727 m (5\' 8" )   LMP  (LMP Unknown)   SpO2 95%   BMI 48.23 kg/m     General:   Optic Nerves:   Psychiatric.   Mental Status: The patient was awake, alert, appropriate  Cranial Nerves: CN II-XII wnl. Left gaze diplopia but EOMs remain conjugate. She also has left eye monocular diplopia (but not gaze evoked).   Motor: 5/5 except 4+ to 5-/5 left deltoid  Reflex: normal tone  Sensation: mild parietal tremor in UE's. Minimal LUE sens ataxia  Coordination: normal FTN. Mild end point tremor LUE  Gait: normal. Sways on romberg. 1/10 step outs ont andem walk. Uses cane      Imaging     Reports:  MRI Brain W WO Contrast  Result Date: 03/07/2023  Impression:   No intracranial mass, acute hemorrhage, acute infarction, or evidence of hydrocephalus. Normal MRI of the brain. Electronically signed by: Silvana Drones M.D. [facility] MK:  03/07/23       Testing       Labs  Lab Results   Component Value Date    HGBA1C 5.4 01/28/2023    EGFR >60.0 02/06/2024    CREAT 0.9 02/06/2024    WBC 11.19 (H) 02/06/2024    LYMPHOABS 2.5 07/25/2022    IGG 950 02/20/2023    IGA 216 01/25/2021    IGM 198 01/25/2021    B12 1,652 (H) 12/12/2023    VITD 39 12/12/2023     02/06/24  VPA free 7.9 (ref 4.8-17)  Inflix 4.8; no anti-inflx ab's.     ASSESSMENT / PLAN  RTC: Return in about 3 months (around 05/27/2024) for [place].    1. Behcet's disease w/ CNS involvement    2. Intractable chronic migraine without aura and with status migrainosus    3. Chronic migraine without aura without status migrainosus, not intractable    4. Memory loss    5. Seizure-like activity (CMS/HCC)    6. 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 Seizures are controlled as >24h eeg was normal.    She has 4-5 headaches a week. Last botox  was 2/17th.     She reports monocular L>R vision difficulty which suggests an intra-ocular (or optic nerve process). She sees Dr Hilma Lucks. Records from Dr  Do will be helpful.    Federal disability retirement Physicist, medical; ok. Send all clinic notes to her lawyer.   Repeat VLTP letter    PLAN  - Continue remicade  5mg /kg, mtx 5mg , prednisone  10mg , otezla  30mg  bid. Agree with  potentially increasing MTX dose.   - continue cognitive rehab  - VPA 500mg  tid , LFT's improved with reduced dose.   - vimpat  200mg  bid  - atomoxetine  100mg  daily  - vitamin d3 2000 units daily    Headache Dr Huntley Mai- atogepant  60mg , nurtec / triptan PRN. Last botox  in Feb.     Orders today (details below)  No orders of the defined types were placed in this encounter.    Orders Placed This Encounter   Medications    atomoxetine  (STRATTERA ) 100 MG capsule     Sig: Take 1 capsule (100 mg) by mouth every morning     Dispense:  90 capsule     Refill:  3       I spent 40 minutes reviewing the records, talking with the patient and  developing their care plan. This excludes time for separately billed procedures.      Penelope Bowie, MD PHD  Director, Neuroimmunology & MS Center  [facility]  [address], [place], [place] [address]    Telephone [phone]  Fax [phone]   The fastest response is via [place]  ===================================================================  NFL (neurofilament light chain) Insurance Justification  Serum / plasma Neurofilament light chain is a well-established biomarker for monitoring MS, and regular testing has been incorporated into the Consortium of MS Centers [facility]) guidelines for the monitoring and clinical decision making for MS.     1)CMSC guidelines PDF: https://mscare.http://www.stevens.com/     2) Laurell Pond MS, Alfred Ann RA, [place], [place], Kuhle J, Lycke J, Olsson T; Consortium of Multiple Sclerosis Centers. Guidance for use of neurofilament light chain as a cerebrospinal fluid and blood biomarker in multiple sclerosis management. EBioMedicine. 2024 Mar;101:104970. doi: 10.1016/j.ebiom.0102.725366. Epub 2024 Feb 13. PMID: 44034742; PMCID: [id number].    PATIENT INSTRUCTIONS PROVIDED  Patient was given an "After Visit Summary" with a copy of the testing orders, medications and the following other instructions:  Patient Instructions   [place] [facility]  MULTIPLE SCLEROSIS & NEUROIMMUNOLOGY CENTER  Telephone: [phone]  Fax: [phone]  Send us  a mychart or inbasket message for the fastest response  ~~~~~~~~~~~~~~~~~~~~~~~~~~~~~~~~~~~~~~~~~~~~~~~~~~~~~~~~~~~~~~~~~~    WELCOME TO THE NEUROIMMUNOLOGY CLINIC!    MULTIPLE SCLEROSIS AND NEUROIMMUNE DISEASE UPDATE  (Updated January 16, 2024)     1) Clinic Procedures  We only do in-person visits   For over 2000 years, medicine has been a face to face, personal enterprise -- and the best medicine happens face to  face. Although telemedicine is very convenient (and was necessary during the pandemic), I've seen poor outcomes with telemedicine care. In-person evaluations are critical part of you doing well. As such we do not offer video visits. Anything minor that does not require a visit can be handled over the MyChart patient portal.     Regular, ongoing, follow up visits is also important. Each person is different, but we see the average patient every 3-6 months. We may not refill controlled substances if you haven't been seen within 3-6 months, and we may not refill other medications if you haven't been seen within 12 months.    Let us  know if your insurance changes. If you  lose insurance and live in [place], [place] offers [facility]. Gisele Lamas of [place] , [place] & [employer] offer similar for patients living in other areas). See [email or web address]    Reaching us   - Mychart is best. We will respond within 2-3 business days. (We do not use regular email as policy)  - My nurses' direct phone [phone]. The main neurology line is [phone]. Leave a message and we will get back to you in a few days.  - After hours, the main line [phone]) will page the on-call neurologist.    You will probably hear back from my nurses. I communicate with them throughout the day. The advice they give you is coming from me - please take it seriously.    There is a 2 week turnaround time for any forms.     Emergency / After Hours Care.   If you have questions after hours, call the main number [phone]) and you will reach the on-call neurologist who can help you.    If you have severe, acute or debilitating symptoms, go to the emergency room. But also let us  know via mychart or a phone call. I can best help you if you are at an [place] hospital. You might not have a choice where the ambulance takes you; if you are not at an [facility] hospital, I'll try to help you the best I can.  But I probably can't see everything that's going on.    Urgent appointments are available within a few days. My nurses have access to more appointment slots than the call center does.     Testing. Details matter.  The tests I order are specific and high-quality MS care requires advanced technology    - MRI's: we prefer to work with [facility] radiology or [facility]. Some of our patients are enrolled in studies at [facility], and that works as well. If you have had outside MRI's done previously, please bring the CD's so that we can upload them.    - LABS: Go to the lab indicated on the top of your lab order. Do not take an [facility] lab slip to [facility]- they will do it incorrectly. Do not go to [facility] or your primary care doctor. Most patients are sent to [facility] lab.    - Results of testing will be communicated once they all return at follow-up visits. Many of the tests are nuanced and must be interpreted within the overall clinical picture. I do not call for results.      2) Testing Phone Numbers  MRI & CT:  [facility] at [phone]  [facility] Fax: [phone]  [facility] at [phone]  For insurance approval, reach out to them first. If there is a denial, let us  know ASAP as we can help.     LP / Spinal Tap: Call [phone] for Specialized Imaging Scheduling. Backup is [facility] at [phone]    Labs: Any [place] location. [email or web address]    3) Doing well with Neuroimmune Disease  Symptoms don't tell you how the disease is doing.  You can experience more symptoms even if the disease isn't worsening. Fluctuating symptoms are expected. Environmental factors like infections, extremes of heat or cold, stress (emotional or physiologic), fatigue, infections, other medical diseases, and certain foods may make you feel more symptoms. That's not the disease worsening, it's not more neurologic damage, and it's not a relapse. Those are worsening symptoms. Most neuroimmune patients  have good days & bad days.  You might need symptomatic medications to help you function well.    Also, some diseases worsen without producing more symptoms.     In general, if you have a question about your symptoms or notice any new symptoms, please reach out.      Monitoring the Disease and Medication Response:  MS and neuroimmune diseases are caused by your body's immune system attacking the nervous system (brain, spinal cord, nerves or muscles). We use immune treatments to prevent more damage. We can measure whether those treatments are working through objective testing - labs, MRI's, EMGs, etc.    For MS, NMO or MOG, we know that everyone develop more lesions, neurologic problems on exam and symptoms unless they are treated (Dr Royston Cornea in [place] proved this). MRI is one of the best ways of detecting more damage - it shows up as a new lesion. The clinical exam and labs can also help us . In MS and MOG, a relapse without symptoms is very common, so we do frequent labs and yearly MRIs to monitor the disease. This is very important to detect subclinical progression of disease.     For other neuroimmune diseases -- eg,  myasthenia, CIDP/GBS, or rheumatologic diseases -- not everyone requires treatment. But we use labs, exam, MRI's and/or EMG to monitor the diseases    Medications are the start, but they aren't everything. They won't repair damage that already happened, and they may not help symptoms. It is to prevent more damage.     Physical and Cognitive Activity Promote Neurologic Recovery: In order to improve the disease, the brain has to rewire around the damaged nerves. The best way to do this is by being active - physically and mentally. Physical, occupational or cognitive therapy can teach you exercises to address specific problems. Physical activity has been shown to improve energy, mood, and reduce pain. Be as physically and mentally active as you can; learn to pace yourself.    Diet: There is no such  thing as an "MS diet" or "anti-inflammatory diet". Foods don't affect MRI lesions or prevent neurologic damage, but a good or bad diet can markedly impact symptoms. My suggestion is to eat healthy - we all know what that is! Diets like the Wahl's protocol or Swank diet have ideas worth trying, but don't feel obligated to follow them precisely. See if adding fiber, reducing gluten, reducing dairy or eliminating processed foods makes a difference in how you feel.    Smoking makes MS worse - it causes more lesions & bigger lesions.     Cannabis worsens cognitive symptoms of MS (Brain fog).    Vitamins and Supplements  Vitamins can affect neurologic functioning:  - Vitaimn D and MS. High vit D levels correlate with better outcomes in MS patients. Target a total (25-OH) Vit D level between 50-100. Vitamin D3 (CHOLEcalciferol; white pill) works better than Vitamin D2 (ERGOcalciferol ; green pill). You can get high doses of vitamin D3 from amazon (eg, 10,000 or 50,000 units).     - Target vit B12 level above 500. Levels below 400 can cause nerve & spinal cord degeneration. Levels above the normal range are perfectly safe.    - Vitamin B6 (pyridoxine) and zinc toxicity cause neuropathy. Do not take B-complex or zinc. Other sources include multi-vitamins, some energy drinks, and other supplements. Check the back of the bottle, not just the front. Laurin Popp CD. "Pyridoxine toxicity courtesy of your local health food store" Ann Rheum Dis. 2006 Dec;65(12):1666-7]    -  marked copper deficiency also causes nerve & spinal cord degeneration    - the data around other supplements like biotin, alpha-lipoic acid, etc is unclear, so I generally don't recommend them    Support Groups  MS Group - [place] (via Zoom) Rossie Coon, for MS, NMO, MOG. Her cell is [phone].  MS Group - Vaughan George [phone]    National MS Society [facility]): MalpracticeBoard.com.au  Myasthenia Gravis Foundation of  [place] (MGFA): MoralGame.si  [employer] for NMO: https://www.sumairafoundation.org/  [employer] for NMO: https://guthyjacksonfoundation.org/  Foundation for Sarcoid Research  DiscFull.com.cy    3) Neurologic Disease, Immune Suppressants and Infections  - MS, NMO, MOG, and GBS/CIDP themselves do not increase your risk of infection (including COVID). Severe myasthenia might increase your risk for lung infections    - Call us  if you develop a fever more than 101 F, have a green-yellow cough longer than 1 week, or if you have recurring or difficult-to-treat infections. Immune suppressants can increase infection risk. In this situation, you will probably require antibiotics (and stronger and longer than normal).    - None of the MS medications increase your COVID risk above that of an untreated patient (COVIMS study)    - Infections can cause more symptoms. That's usually not disease worsening or neurologic damage from infection. That is symptom worsening, similar to what happens with stress or the heat. You will get better a few days after the infection improves.    - Rarely, COVID can cause neurologic complications. Early on, with severe COVID, we saw a lot of strokes and seizures. We see less of that now. But, we are seeing the emergence of a post-COVID neurologic syndrome that looks like Guillain-Barre syndrome or transverse myelitis. The way to test for this is with a spinal tap (lumbar puncture). Neuro-COVID is treatable.    4) Vaccines  Safe Vaccines, regardless of immunosuppressant  - Pfizer, Moderna, Novavax and Booster COVID vaccines are safe in MS and with all immunosuppressants. (There were issues with J&J and Astra-Zeneca).    - Flu vaccine. Use Flublok brand only (recombinant hemagluttin quadrivalent). This is per FDA guidance for all neurologic patients. It can be difficult to find and we can give you an RX for it.     -  Shingles vaccine. Use Shingrix  (RZV, recombinant zoster) vaccine. This is per FDA guidance for all neurologic patients.     - All pneumonia vaccines are safe (Prevnar & Capvaxive )    - Tetanus toxoid and TDAP are ok (TDAP is preferred over DTAP).     Unsafe Vaccines (for any neuroimmune patients)  - Live virus or inactivated virus vaccines are contraindicated in neuro-immunologic patients (Including measles vaccine - this is contraindicated)    - monkeypox vaccines (JYNNEOS and ACAM2000) are contraindicated in MS and patients on immunosupressants.     - RSV vaccines are associated with cases of Guillian Barre syndrome, so I would avoid them.     - J&J and Astra Zeneca COVID vaccine were associated with transverse myelitis    5) Other Notes:  - Mavenclad: The contraindication and timing requirements do not apply to most vaccines. They are only for live virus vaccines, which are contraindicated anwyays    - You don't need to time your vaccinations with infusion dates. There have been theoretical concerns about immunosuppression affecting vaccine effectiveness, but recent research has shown this not to be true.    - MS is compatible with pregnancy but we need  to know if you plan on getting pregnant or become pregnant as we may need to adjust your treatment. Certain disease modifying treatments for MS are better than others for those seeking to become pregnant. Please also let me know when you find out you're pregnant.     - I do not prescribe long term opioids for nerve pain. Prescription opioids lead to worse MS symptoms, with studies showing significantly worse fatigue scores and worse pain scores compared to non-opioid users. Neurological pain is best managed by evidence-based non-opiate neuropathic pain medications. Complicated chronic pain management or non-neurological pain management will be referred to pain clinic.     - I do not recommend the use of marijuana or ingestion of THC /CBD containing products.  Studies show marijuana worsen cognitive scores in MS and contribute to worsened brain fog and fatigue. Studies do not support the use of medical marijuana for spasticity or pain given a lack of objective efficacy to relieving spasticity. Topical CBD creams and ointments available over hte counter are safer as very little gets into your bloodstream. Instead, we use a comprehensive evidence-based approach in managing spasticity and pain that is individualized to the patient.        Thank you,      Penelope Bowie, MD PHD  Director, Neuroimmunology & MS Center  [facility]  [address], [address], [place], [place] [address]    Telephone [phone]  Fax [phone]   The fastest response is via [place]      IMPORTED INFORMATION FROM MEDICAL RECORDS   Diagnosis ICD-10-CM Associated Order   1. Behcet's disease w/ CNS involvement  M35.2       2. Intractable chronic migraine without aura and with status migrainosus  G43.711       3. Chronic migraine without aura without status migrainosus, not intractable  G43.709       4. Memory loss  R41.3       5. Seizure-like activity (CMS/HCC)  R56.9       6. Binocular vision disorder with diplopia  H53.2         No orders of the defined types were placed in this encounter.

## 2024-02-26 NOTE — Progress Notes (Signed)
 Specialty Pharmacy Refill Note    Kristin Barcus is a 43 y.o. female, who is being followed by [facility] team for management of: RxSp Migraine (Enrolled) for the following services:  Refill Management  Benefits and PA Management    Medications monitored by Specialty Pharmacy:   Atogepant  60 MG Tab   TAKE 1 TABLET (60 MG) BY MOUTH NIGHTLY FOR MIGRAINE PREVENTION    Rimegepant Sulfate  75 MG Tablet Dispersible   TAKE 1 TABLET (75 MG) BY MOUTH AS NEEDED (MIGRAINE)      Refill Coordination  Medications Linked to Program: Atogepant ; Rimegepant Sulfate   HIPAA verified (patient name & dob or patient name & address) with approved contact: Yes  HIPAA verification: please specify: Ranetta L. Gapinski 09-Aug-1981  How has _______ (specialty medication) helped you manage your _______ (condition) from very well to very poor: Ok or neutral  Changes to allergies?: No  Changes to medications, herbals or supplements?: Yes  New conditions (diagnosis) since last pharmacy outreach: No  Since the last fill, has the patient experienced any unplanned office visit, urgent care, emergency room, or hospital admission: No  Since the last fill, any new or worsened side effects?: No  Explain any yes answers for changes in meds, allergies, dx, unplanned visits or new/worsening SDE or questions/concerns for the pharmacist: New medication - Buprenorphine 10 mcg/hr patch  Financial problems or insurance changes : No  Since the last fill, has the patient missed any doses of their specialty medication : No  Doses left of specialty medications: >7  Does the patient have the correct number of remaining doses: Yes  Patient confirmations: received welcome packet, patient bill of rights, & privacy practices: Patient has received all  Patient confirmation: patient previously received the drug monograph(s) for their specialty medication(s): Yes    Delivery Information  Delivery confirmation: signature required for delivery:  No  Delivery method: FedEx  Delivery confirmation: delivery address: Delivery Address Reviewed & Accurate  Enter delivery address: [address] [place] [address]  Delivery phone number: [phone]  Delivery confirmation: patient informed of and confirms delivery date. Enter Delivery date: : 02/29/24  Preferred delivery time?: Anytime  Number of medications in delivery: 1  Is there any medication that is due not being filled?: No  Supplies needed?: No supplies needed  Does patient have any concerns about safely storing medications (at the correct temperature, away from children/pets,etc.)?: No  Do any medications need mixed or dated?: No  Financial confirmation: patient informed of financial responsibility and copay amount: No  Financial responsibility: copay amount: $0  Copay form of payment: Credit card on file  Questions or concerns for the pharmacist?: No  Explain any yes answers for changes in meds, allergies, dx, unplanned visits or new/worsening SDE or questions/concerns for the pharmacist: New medication - Buprenorphine 10 mcg/hr patch  Are any medications first time fills?: No        Medicare Part B Fill? No    Cyndi Drain

## 2024-02-26 NOTE — Patient Instructions (Signed)
 [facility]  MULTIPLE SCLEROSIS & NEUROIMMUNOLOGY CENTER  Telephone: [phone]  Fax: [phone]  Send us  a mychart or inbasket message for the fastest response  ~~~~~~~~~~~~~~~~~~~~~~~~~~~~~~~~~~~~~~~~~~~~~~~~~~~~~~~~~~~~~~~~~~    WELCOME TO THE NEUROIMMUNOLOGY CLINIC!    MULTIPLE SCLEROSIS AND NEUROIMMUNE DISEASE UPDATE  (Updated January 16, 2024)     1) Clinic Procedures  We only do in-person visits   For over 2000 years, medicine has been a face to face, personal enterprise -- and the best medicine happens face to face. Although telemedicine is very convenient (and was necessary during the pandemic), I've seen poor outcomes with telemedicine care. In-person evaluations are critical part of you doing well. As such we do not offer video visits. Anything minor that does not require a visit can be handled over the MyChart patient portal.     Regular, ongoing, follow up visits is also important. Each person is different, but we see the average patient every 3-6 months. We may not refill controlled substances if you haven't been seen within 3-6 months, and we may not refill other medications if you haven't been seen within 12 months.    Let us  know if your insurance changes. If you lose insurance and live in [place], [place] offers [facility]. Gisele Lamas of [place] , [place] & [facility] offer similar for patients living in other areas). See [email or web address]    Reaching us   - Mychart is best. We will respond within 2-3 business days. (We do not use regular email as policy)  - My nurses' direct phone [phone]. The main neurology line is [phone]. Leave a message and we will get back to you in a few days.  - After hours, the main line [phone]) will page the on-call neurologist.    You will probably hear back from my nurses. I communicate with them throughout the day. The advice they give you is coming from me - please take it  seriously.    There is a 2 week turnaround time for any forms.     Emergency / After Hours Care.   If you have questions after hours, call the main number [phone]) and you will reach the on-call neurologist who can help you.    If you have severe, acute or debilitating symptoms, go to the emergency room. But also let us  know via mychart or a phone call. I can best help you if you are at an [facility] hospital. You might not have a choice where the ambulance takes you; if you are not at an [facility] hospital, I'll try to help you the best I can. But I probably can't see everything that's going on.    Urgent appointments are available within a few days. My nurses have access to more appointment slots than the call center does.     Testing. Details matter.  The tests I order are specific and high-quality MS care requires advanced technology    - MRI's: we prefer to work with [facility] radiology or [place] imaging. Some of our patients are enrolled in studies at [facility], and that works as well. If you have had outside MRI's done previously, please bring the CD's so that we can upload them.    - LABS: Go to the lab indicated on the top of your lab order. Do not take an [place] lab slip to [facility]- they will do it incorrectly. Do not go to [facility] or your primary care doctor. Most patients are sent to [facility] lab.    -  Results of testing will be communicated once they all return at follow-up visits. Many of the tests are nuanced and must be interpreted within the overall clinical picture. I do not call for results.      2) Testing Phone Numbers  MRI & CT:  [facility] at [phone]  [facility] Fax: [phone]  [facility] at [phone]  For insurance approval, reach out to them first. If there is a denial, let us  know ASAP as we can help.     LP / Spinal Tap: Call [phone] for Specialized Imaging Scheduling. Backup is [facility] at [phone]    Labs: Any [place] location.  [email or web address]    3) Doing well with Neuroimmune Disease  Symptoms don't tell you how the disease is doing.  You can experience more symptoms even if the disease isn't worsening. Fluctuating symptoms are expected. Environmental factors like infections, extremes of heat or cold, stress (emotional or physiologic), fatigue, infections, other medical diseases, and certain foods may make you feel more symptoms. That's not the disease worsening, it's not more neurologic damage, and it's not a relapse. Those are worsening symptoms. Most neuroimmune patients have good days & bad days. You might need symptomatic medications to help you function well.    Also, some diseases worsen without producing more symptoms.     In general, if you have a question about your symptoms or notice any new symptoms, please reach out.      Monitoring the Disease and Medication Response:  MS and neuroimmune diseases are caused by your body's immune system attacking the nervous system (brain, spinal cord, nerves or muscles). We use immune treatments to prevent more damage. We can measure whether those treatments are working through objective testing - labs, MRI's, EMGs, etc.    For MS, NMO or MOG, we know that everyone develop more lesions, neurologic problems on exam and symptoms unless they are treated (Dr Royston Cornea in [place] proved this). MRI is one of the best ways of detecting more damage - it shows up as a new lesion. The clinical exam and labs can also help us . In MS and MOG, a relapse without symptoms is very common, so we do frequent labs and yearly MRIs to monitor the disease. This is very important to detect subclinical progression of disease.     For other neuroimmune diseases -- eg,  myasthenia, CIDP/GBS, or rheumatologic diseases -- not everyone requires treatment. But we use labs, exam, MRI's and/or EMG to monitor the diseases    Medications are the start, but they aren't  everything. They won't repair damage that already happened, and they may not help symptoms. It is to prevent more damage.     Physical and Cognitive Activity Promote Neurologic Recovery: In order to improve the disease, the brain has to rewire around the damaged nerves. The best way to do this is by being active - physically and mentally. Physical, occupational or cognitive therapy can teach you exercises to address specific problems. Physical activity has been shown to improve energy, mood, and reduce pain. Be as physically and mentally active as you can; learn to pace yourself.    Diet: There is no such thing as an "MS diet" or "anti-inflammatory diet". Foods don't affect MRI lesions or prevent neurologic damage, but a good or bad diet can markedly impact symptoms. My suggestion is to eat healthy - we all know what that is! Diets like the Wahl's protocol or Swank diet have ideas worth  trying, but don't feel obligated to follow them precisely. See if adding fiber, reducing gluten, reducing dairy or eliminating processed foods makes a difference in how you feel.    Smoking makes MS worse - it causes more lesions & bigger lesions.     Cannabis worsens cognitive symptoms of MS (Brain fog).    Vitamins and Supplements  Vitamins can affect neurologic functioning:  - Vitaimn D and MS. High vit D levels correlate with better outcomes in MS patients. Target a total (25-OH) Vit D level between 50-100. Vitamin D3 (CHOLEcalciferol; white pill) works better than Vitamin D2 (ERGOcalciferol ; green pill). You can get high doses of vitamin D3 from amazon (eg, 10,000 or 50,000 units).     - Target vit B12 level above 500. Levels below 400 can cause nerve & spinal cord degeneration. Levels above the normal range are perfectly safe.    - Vitamin B6 (pyridoxine) and zinc  toxicity cause neuropathy. Do not take B-complex or zinc . Other sources include multi-vitamins, some energy drinks, and other supplements. Check the back of the  bottle, not just the front. Laurin Popp CD. "Pyridoxine toxicity courtesy of your local health food store" Ann Rheum Dis. 2006 Dec;65(12):1666-7]    - marked copper deficiency also causes nerve & spinal cord degeneration    - the data around other supplements like biotin, alpha-lipoic acid, etc is unclear, so I generally don't recommend them    Support Groups  MS Group - [place] (via Zoom) Rossie Coon, for MS, NMO, MOG. Her cell is [phone].  MS Group - Vaughan George [phone]    National MS Society [facility]): MalpracticeBoard.com.au  Myasthenia Gravis Foundation of [place] (MGFA): MoralGame.si  [employer] for NMO: https://www.sumairafoundation.org/  [employer] for NMO: https://guthyjacksonfoundation.org/  Foundation for Sarcoid Research  DiscFull.com.cy    3) Neurologic Disease, Immune Suppressants and Infections  - MS, NMO, MOG, and GBS/CIDP themselves do not increase your risk of infection (including COVID). Severe myasthenia might increase your risk for lung infections    - Call us  if you develop a fever more than 101 F, have a green-yellow cough longer than 1 week, or if you have recurring or difficult-to-treat infections. Immune suppressants can increase infection risk. In this situation, you will probably require antibiotics (and stronger and longer than normal).    - None of the MS medications increase your COVID risk above that of an untreated patient (COVIMS study)    - Infections can cause more symptoms. That's usually not disease worsening or neurologic damage from infection. That is symptom worsening, similar to what happens with stress or the heat. You will get better a few days after the infection improves.    - Rarely, COVID can cause neurologic complications. Early on, with severe COVID, we saw a lot of strokes and seizures. We see less of that now. But, we are seeing the  emergence of a post-COVID neurologic syndrome that looks like Guillain-Barre syndrome or transverse myelitis. The way to test for this is with a spinal tap (lumbar puncture). Neuro-COVID is treatable.    4) Vaccines  Safe Vaccines, regardless of immunosuppressant  - Pfizer, Moderna, Novavax and Booster COVID vaccines are safe in MS and with all immunosuppressants. (There were issues with J&J and Astra-Zeneca).    - Flu vaccine. Use Flublok brand only (recombinant hemagluttin quadrivalent). This is per FDA guidance for all neurologic patients. It can be difficult to find and we can give you an RX for it.     -  Shingles vaccine. Use Shingrix (RZV, recombinant zoster) vaccine. This is per FDA guidance for all neurologic patients.     - All pneumonia vaccines are safe (Prevnar & Capvaxive )    - Tetanus toxoid and TDAP are ok (TDAP is preferred over DTAP).     Unsafe Vaccines (for any neuroimmune patients)  - Live virus or inactivated virus vaccines are contraindicated in neuro-immunologic patients (Including measles vaccine - this is contraindicated)    - monkeypox vaccines (JYNNEOS and ACAM2000) are contraindicated in MS and patients on immunosupressants.     - RSV vaccines are associated with cases of Guillian Barre syndrome, so I would avoid them.     - J&J and Astra Zeneca COVID vaccine were associated with transverse myelitis    5) Other Notes:  - Mavenclad: The contraindication and timing requirements do not apply to most vaccines. They are only for live virus vaccines, which are contraindicated anwyays    - You don't need to time your vaccinations with infusion dates. There have been theoretical concerns about immunosuppression affecting vaccine effectiveness, but recent research has shown this not to be true.    - MS is compatible with pregnancy but we need to know if you plan on getting pregnant or become pregnant as we may need to adjust your treatment. Certain disease modifying treatments for MS are better  than others for those seeking to become pregnant. Please also let me know when you find out you're pregnant.     - I do not prescribe long term opioids for nerve pain. Prescription opioids lead to worse MS symptoms, with studies showing significantly worse fatigue scores and worse pain scores compared to non-opioid users. Neurological pain is best managed by evidence-based non-opiate neuropathic pain medications. Complicated chronic pain management or non-neurological pain management will be referred to pain clinic.     - I do not recommend the use of marijuana or ingestion of THC /CBD containing products. Studies show marijuana worsen cognitive scores in MS and contribute to worsened brain fog and fatigue. Studies do not support the use of medical marijuana for spasticity or pain given a lack of objective efficacy to relieving spasticity. Topical CBD creams and ointments available over hte counter are safer as very little gets into your bloodstream. Instead, we use a comprehensive evidence-based approach in managing spasticity and pain that is individualized to the patient.        Thank you,      Penelope Bowie, MD PHD  Director, Neuroimmunology & MS Center  [facility]  [address], [place], [place] [address]    Telephone [phone]  Fax [phone]   The fastest response is via [place]

## 2024-02-27 ENCOUNTER — Other Ambulatory Visit: Payer: Self-pay | Admitting: Family Nurse Practitioner

## 2024-02-27 ENCOUNTER — Encounter (INDEPENDENT_AMBULATORY_CARE_PROVIDER_SITE_OTHER): Payer: Self-pay | Admitting: Internal Medicine

## 2024-02-27 ENCOUNTER — Other Ambulatory Visit: Payer: Self-pay

## 2024-02-27 DIAGNOSIS — G43709 Chronic migraine without aura, not intractable, without status migrainosus: Secondary | ICD-10-CM

## 2024-02-27 DIAGNOSIS — G43711 Chronic migraine without aura, intractable, with status migrainosus: Secondary | ICD-10-CM

## 2024-02-27 MED ORDER — ONABOTULINUMTOXINA 200 UNITS IJ SOLR
INTRAMUSCULAR | 3 refills | Status: AC
  Filled 2024-02-28: qty 1, fill #0
  Filled 2024-03-12 – 2024-03-13 (×2): qty 1, 84d supply, fill #0
  Filled 2024-06-03: qty 1, 84d supply, fill #1
  Filled 2024-09-06: qty 1, 84d supply, fill #2
  Filled 2024-12-01: qty 1, 84d supply, fill #3

## 2024-02-28 ENCOUNTER — Other Ambulatory Visit: Payer: Self-pay

## 2024-02-28 NOTE — Progress Notes (Unsigned)
 Specialty Pharmacy Refill Note    Hailey [name] is a 43 y.o. female, who is being followed by [facility] team for management of: RxSp Migraine (Enrolled) for the following services:  Refill Management  Benefits and PA Management    Medications monitored by Specialty Pharmacy:   Atogepant  60 MG Tablet   TAKE 1 TABLET (60 MG) BY MOUTH NIGHTLY FOR MIGRAINE PREVENTION    botulinum toxin type A  (BOTOX ) 200 units injection   INJECT 155 UNITS INTRAMUSCULARLY EVERY 12 WEEKS    Rimegepant Sulfate  75 MG Tablet Dispersible   TAKE 1 TABLET (75 MG) BY MOUTH AS NEEDED (MIGRAINE)      Refill Coordination  Medications Linked to Program: OnabotulinumtoxinA  (BOTOX )  HIPAA verified (patient name & dob or patient name & address) with approved contact: Chart Review  How has _______ (specialty medication) helped you manage your _______ (condition) from very well to very poor: Ok or neutral  Changes to allergies?: No  Changes to medications, herbals or supplements?: No  New conditions (diagnosis) since last pharmacy outreach: No  Since the last fill, has the patient experienced any unplanned office visit, urgent care, emergency room, or hospital admission: No  Since the last fill, any new or worsened side effects?: No  Financial problems or insurance changes : No  Since the last fill, has the patient missed any doses of their specialty medication : No  Since your last fill, how many doses of your specialty medications were missed?: 0  Doses left of specialty medications: 0  Does the patient have the correct number of remaining doses: Yes  Patient confirmations: received welcome packet, patient bill of rights, & privacy practices: Patient has received all  Patient confirmation: patient previously received the drug monograph(s) for their specialty medication(s): Yes    Delivery Information  Delivery confirmation: signature required for delivery: No  Delivery method: Pick up at Specialty Pharmacy  Delivery  confirmation: delivery address: Delivery Address Reviewed & Accurate  Enter delivery address: [address] [place] [address]  Delivery phone number: [phone]  Delivery confirmation: patient informed of and confirms delivery date. Enter Delivery date: : 03/18/24  Preferred time?: Anytime  Number of medications in delivery: 1  Is there any medication that is due not being filled?: No  Supplies needed?: No supplies needed  Does patient have any concerns about safely storing medications (at the correct temperature, away from children/pets,etc.)?: No  Do any medications need mixed or dated?: No  Financial confirmation: patient informed of financial responsibility and copay amount: Yes  Financial responsibility: copay amount: 0.00  Copay form of payment: Credit card on file  Questions or concerns for the pharmacist?: No  Are any medications first time fills?: No        Medicare Part B Fill? No    Hailey [name]

## 2024-02-29 ENCOUNTER — Ambulatory Visit: Payer: BLUE CROSS/BLUE SHIELD

## 2024-02-29 DIAGNOSIS — R413 Other amnesia: Secondary | ICD-10-CM

## 2024-02-29 DIAGNOSIS — R41841 Cognitive communication deficit: Secondary | ICD-10-CM

## 2024-02-29 DIAGNOSIS — R41840 Attention and concentration deficit: Secondary | ICD-10-CM

## 2024-02-29 DIAGNOSIS — R41844 Frontal lobe and executive function deficit: Secondary | ICD-10-CM

## 2024-02-29 NOTE — Progress Notes (Signed)
 [facility]  [address], [address]  [place], [place]  [address]  Phone:  [phone]  Fax:  [phone]    SPEECH THERAPY DAILY TREATMENT NOTE    PATIENT: Hailey [name] DOB: 07-18-1981   MR #: [id number]  AGE: 43 y.o.    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];Memory loss [R41.3]  TREATMENT DIAGNOSIS: Executive function deficit R41.844 Memory Deficits R41.3 Attention Deficits R41.840 Cognitive-Communication Deficit R41.841     Date of Service SLP Received On: 02/29/24   Treatment Time Start Time: 1100 to Stop Time: 1200   Time Calculation Time Calculation (min): 60 min   Visit # SLP Visit Number: 9   Units Billed   SLP Treatments  $ SLP Treatment Indiv [phone]): 1 Procedure     Medications:  has a current medication list which includes the following prescription(s): atogepant , atomoxetine , botulinum toxin type a , buprenorphine, buspirone , clindamycin , colesevelam , dapsone, dicyclomine , divalproex  (er) extended release, dorzolamide-timolol, eletriptan , epinephrine , folic acid , hydroxyzine , infliximab , lacosamide , lidocaine -prilocaine, melatonin-pyridoxine, methocarbamol , methotrexate , multiple vitamin, omeprazole , ondansetron , otezla , prednisone , rimegepant sulfate , valacyclovir , cyanocobalamin , and vitamin d , and the following Facility-Administered Medications: onabotulinumtoxin a.  Patient/Caregiver does not report changes to medication at this time.    Precautions:   Seizures - Most recent seizure: December 16, 2023                    Rescue Plan: No rescue plan, typically come out of it after 90 seconds                    At times a sense of deja vu and then unresponsive     Allergies:  Allergies[1]    SUBJECTIVE REPORT:  Patient presents for speech therapy today alone. Pt reports "a hard  alarm for afternoon medications is working. I am setting an alarm on the oven after I eat so I have to get up and take my medications."    Pain:    4 on a scale of 1 - 10; location: SI joints      OBJECTIVE FINDINGS:  Interventions / Goal Status:  Goals copied from last visit:    Short Term Goals:  To be met by 6 weeks from 02/22/2024   Pt will verbalize at least 3 attention strategies and describe at least 2 functional situations across a 7 week period in which she used attention strategies during functional tasks when provided with priming for strategy use in order to improve attention during activities of daily living.  Pt will utilize word finding strategies during structured language tasks and conversation in 4/5 opportunities when provided with min cues in order to improve efficient communication with spouse, friends, and medical professionals.   Pt will utilize attentive reading and constrained summarization when reading short paragraphs (3-5 sentences) of material relevant to their interests and daily life (personal letters, news, magazine articles, etc.) and answer questions regarding material with 90% acc when provided with min cues across 2 sessions to improve reading comprehension.      Long Term Goals:  To be met by 12 weeks from 02/22/2024  Pt will report improved cognitive communication skills demonstrated by an increased score on patient reported outcome measure (PROMIS Item Bank v2.0 - Cognitive Function - Short Form 8a) from a  baseline score of 16/40.  Pt will independently utilize external memory support (e.g. alarms on phone) for recall of medication management in 7/7 days of the week across 2 consecutive weeks based on pt report in order to decrease memory lapses when taking medications.  Pt will verbalize at least 2 processing strategies (i.e. requesting clarification, requesting repetition, paraphrasing information, etc.) and describe at least 2 situation where strategies were utilized during  functional tasks across a 7 day period (e.g. medical appointments, phone calls, conversation with husband), based on pt report.      GOAL # INTERVENTIONS PROGRESS   STG 1 Cognitive Pacing Scale    Attention strategies - Dedicated time for tasks, break tasks down, identify and manage distractions, etc. PROGRESSING:    Pt verbalized 2 attention strategies independently and how she utilized them in 2 functional tasks across a 7 day period.      STG 2 First letter, describe the word, association, reference notes, role play, etc. EMERGING:     Word finding strategies introduced.   STG 3  NOT ADDRESSED TODAY.        LTG 1  NOT ADDRESSED TODAY.   LTG 2  EMERGING:     Pt utilizing alarm to recall medications in 7/7 opportunities across 1 week.    LTG 3  NOT ADDRESSED TODAY.     Education/Home Exercise Program:    Patient was educated on strategies and techniques used in treatment session.  Word finding strategies    HEP: SFA using SFA map    Response to Treatment today:   Word finding strategies introduced via handout and verbal explanation. Pt receptive to use and asking excellent questions. Semantic feature analysis (SFA) map provided for additional support.     ASSESSMENT: Hailey [name] is a 43 y.o. female presents to outpatient therapy today with mild cognitive communication impairments, mild memory impairments, and mild executive function impairments. These deficits impact the pt's ability to complete functional tasks in the home such as cooking, organizing a list, reading complex material, and recalling medical information. Pt warrants skilled SLP intervention to address deficits and train cognitive linguistic compensatory strategies for increased independence in functional tasks.     The following impairments were noted:  Mild cognitive communication impairment, as characterized by deficits in word finding in conversation, phonemic fluency, comprehension of basic and complex written material, and problem  solving.  Mild memory impairment, as characterized by impairments in delayed recall of new information.   Mild executive function and attention impairment, as characterized by impairments in sustained attention, alternating/divided attention, and planning/organizing.      Patient presents with the following functional limitations:  Patient has difficulty with communicating at the level of conversation.  Unable to effectively implement attention / memory strategies for sustained attention, alternating/divided attention, and recall of new information.  Difficulty with executive function skills such as  problem solving and planning/organizing.     Without intervention, patient is at risk for:  Decreased social interactions that may lead to further cognitive decline and contribute to further medical complications and increased fall risk.  Inability to hold current job position and loss of income.  Burden of care.  Depression.    Patient is progressing well toward above mentioned functional goals.    PLAN:   It is medically necessary for Claudeen Crutch to recieve skilled speech therapy intervention in order to address the above impairments and functional limitations.    Recommend continue per plan of care to work  toward increased cognitive and communication abilities for safety and independence with daily activities and in the community.     NEXT VISIT: Focus on internal and external memory strategies, review processing strategies, review word finding strategies, introduce reading strategies    Therapist Signature:    Cherylynn Cosier M.A., CCC-SLP  Speech Language Pathologist  [facility]  Outpatient Specialty Rehabilitation  [email or web address]    02/29/2024         [1]   Allergies  Allergen Reactions    Fruit Blend Flavor [Flavoring Agent (Non-Screening)] Anaphylaxis and Swelling     Tropical Fruit     Latex Hives, Swelling, Other (See Comments) and Drug-Induced Flushing    Adhesive [Wound Dressing  Adhesive] Itching    Cyanoacrylate Itching    Other Itching and Rash     Sutures Monocryl    Oxycodone Hallucinations

## 2024-03-01 ENCOUNTER — Encounter (INDEPENDENT_AMBULATORY_CARE_PROVIDER_SITE_OTHER): Payer: Self-pay | Admitting: Family Medicine

## 2024-03-03 ENCOUNTER — Other Ambulatory Visit: Payer: Self-pay | Admitting: Neurology

## 2024-03-05 NOTE — Telephone Encounter (Signed)
 LOV 02/26/24  FU 05/27/24

## 2024-03-06 MED ORDER — PREDNISONE 10 MG PO TABS
10.0000 mg | ORAL_TABLET | Freq: Every day | ORAL | 5 refills | Status: DC
Start: 2024-03-06 — End: 2024-09-02

## 2024-03-07 ENCOUNTER — Other Ambulatory Visit (INDEPENDENT_AMBULATORY_CARE_PROVIDER_SITE_OTHER): Payer: Self-pay

## 2024-03-07 ENCOUNTER — Ambulatory Visit: Payer: BLUE CROSS/BLUE SHIELD

## 2024-03-07 ENCOUNTER — Encounter: Payer: Self-pay | Admitting: Neurology

## 2024-03-11 ENCOUNTER — Ambulatory Visit (INDEPENDENT_AMBULATORY_CARE_PROVIDER_SITE_OTHER): Payer: BLUE CROSS/BLUE SHIELD | Admitting: Obstetrics and Gynecology

## 2024-03-11 ENCOUNTER — Encounter (INDEPENDENT_AMBULATORY_CARE_PROVIDER_SITE_OTHER): Payer: Self-pay | Admitting: Obstetrics and Gynecology

## 2024-03-11 VITALS — BP 133/97 | HR 123 | Temp 98.0°F | Resp 12 | Ht 68.0 in | Wt 315.0 lb

## 2024-03-11 DIAGNOSIS — Z01419 Encounter for gynecological examination (general) (routine) without abnormal findings: Secondary | ICD-10-CM

## 2024-03-11 DIAGNOSIS — Z1231 Encounter for screening mammogram for malignant neoplasm of breast: Secondary | ICD-10-CM
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
## 2024-03-12 ENCOUNTER — Other Ambulatory Visit: Payer: Self-pay

## 2024-03-13 ENCOUNTER — Other Ambulatory Visit: Payer: Self-pay

## 2024-03-14 ENCOUNTER — Ambulatory Visit: Payer: BLUE CROSS/BLUE SHIELD | Attending: Neurology

## 2024-03-14 ENCOUNTER — Other Ambulatory Visit: Payer: Self-pay

## 2024-03-14 DIAGNOSIS — M352 Behcet's disease: Secondary | ICD-10-CM | POA: Insufficient documentation

## 2024-03-14 DIAGNOSIS — R41841 Cognitive communication deficit: Secondary | ICD-10-CM | POA: Insufficient documentation

## 2024-03-14 DIAGNOSIS — R413 Other amnesia: Secondary | ICD-10-CM | POA: Insufficient documentation

## 2024-03-14 DIAGNOSIS — R41840 Attention and concentration deficit: Secondary | ICD-10-CM | POA: Insufficient documentation

## 2024-03-14 DIAGNOSIS — R41844 Frontal lobe and executive function deficit: Secondary | ICD-10-CM | POA: Insufficient documentation

## 2024-03-14 NOTE — Progress Notes (Signed)
 [facility]  [address]  [place], [place]  [address]  Phone:  [phone]  Fax:  [phone]    SPEECH THERAPY DAILY TREATMENT NOTE AND UPDATED PLAN OF CARE    PATIENT: Hailey [name] DOB: 04/27/1981   MR #: [id number]  AGE: 43 y.o.    FACILITY PROVIDER #: [id number] PRIMARY MD: Lawence Press, MD    CERTIFICATION DATES:     11/30/23 to 02/26/2024    NEW CERTIFICATION DATES:  03/14/2024 to 06/12/2024  START OF CARE:  SLP Received On: 11/30/23   DIAGNOSES:  Behcet's disease [M35.2];Memory loss [R41.3]  TREATMENT DIAGNOSIS: Executive function deficit R41.844 Memory Deficits R41.3 Attention Deficits R41.840 Cognitive-Communication Deficit R41.841     Date of Service SLP Received On: 03/14/24   Treatment Time Start Time: 1100 to Stop Time: 1200   Time Calculation Time Calculation (min): 60 min   Visit # SLP Visit Number: 10   Units Billed   SLP Treatments  $ SLP Treatment Indiv [phone]): 1 Procedure     Medications:  has a current medication list which includes the following prescription(s): atogepant , atomoxetine , botulinum toxin type a , buprenorphine, buspirone , clindamycin , colesevelam , dapsone, dicyclomine , divalproex  (er) extended release, dorzolamide-timolol, eletriptan , epinephrine , folic acid , hydroxyzine , infliximab , lacosamide , lidocaine -prilocaine, melatonin-pyridoxine, methocarbamol , methotrexate , multiple vitamin, omeprazole , ondansetron , otezla , prednisone , rimegepant sulfate , valacyclovir , cyanocobalamin , and vitamin d , and the following Facility-Administered Medications: onabotulinumtoxin a.  Patient/Caregiver does not report changes to medication at this time.    Precautions:   Seizures - Most recent seizure: December 16, 2023                    Rescue Plan: No rescue plan, typically come out of it after 90 seconds                    At times a sense of deja vu and then unresponsive      Allergies:  Allergies[1]    SUBJECTIVE REPORT:  Patient presents for speech therapy today alone. Pt reports she implemented a hard stop time in the evenings due to difficulty emotionally pacing when it gets late. She reports continued nausea due to medication and is having difficulty managing this symptom in combination with cognitive symptoms as well.    Pain:    4 on a scale of 1 - 10; location: SI joints      OBJECTIVE FINDINGS:  Interventions / Goal Status:  Goals copied from last visit:    Short Term Goals:  To be met by 6 weeks from 02/22/2024   Pt will verbalize at least 3 attention strategies and describe at least 2 functional situations across a 7 week period in which she used attention strategies during functional tasks when provided with priming for strategy use in order to improve attention during activities of daily living.   GOAL STATUS: MET  Pt will utilize word finding strategies during structured language tasks and conversation in 4/5 opportunities when provided with min cues in order to improve efficient communication with spouse, friends, and medical professionals.    GOAL STATUS: MET  Pt will utilize attentive reading and constrained summarization when reading short paragraphs (3-5 sentences) of material relevant to their interests and daily life (personal letters, news, magazine articles, etc.) and answer questions regarding material with 90% acc when provided with min cues across 2 sessions to improve reading comprehension.    GOAL STATUS: NOT ADDRESSED THIS CERTIFICATION PERIOD  Long Term Goals:  To be met by 12 weeks from 02/22/2024  Pt will report improved cognitive communication skills demonstrated by an increased score on patient reported outcome measure (PROMIS Item Bank v2.0 - Cognitive Function - Short Form 8a) from a baseline score of 16/40.   GOAL STATUS: NOT ADDRESSED  Pt will independently utilize external memory support (e.g. alarms on phone) for recall of medication  management in 7/7 days of the week across 2 consecutive weeks based on pt report in order to decrease memory lapses when taking medications.   GOAL STATUS: MET  Pt will verbalize at least 2 processing strategies (i.e. requesting clarification, requesting repetition, paraphrasing information, etc.) and describe at least 2 situation where strategies were utilized during functional tasks across a 7 day period (e.g. medical appointments, phone calls, conversation with husband), based on pt report.    GOAL STATUS: PROGRESSING  Pt is able to verbalize processing strategies and utilize in situations such as conversation with familiar person (husband), but describes difficulty being able to recall and utilize during medical situations such as during doctor's appointments, leading to misunderstandings. Pt is progressing towards goal as she is consistently using with familiar conversation partners, but will continue goal to work towards use of strategies during critical times such as medical appointments and phone calls).     GOAL # INTERVENTIONS PROGRESS   STG 1 Cognitive Pacing Scale    Attention strategies - Dedicated time for tasks, break tasks down, identify and manage distractions, etc. MET.    Pt verbalized 2 attention strategies independently and how she utilized them in 2 functional tasks across a 7 day period.      STG 2 First letter, describe the word, association, reference notes, role play, etc. MET.    Word finding strategies utilized in 4/5 opportunities during structured language tasks when provided with min cues.   STG 3  NOT ADDRESSED TODAY.        LTG 1  NOT ADDRESSED TODAY.   LTG 2  MET.    Pt utilizing alarm to recall medications in 7/7 opportunities across 2 weeks.    LTG 3  PROGRESSING:    Pt verbalized 3 processing strategies and described 1 situation where she did not utilize processing strategies, but identified following misunderstanding processing strategies she could have used.     Education/Home  Exercise Program:    Patient was educated on strategies and techniques used in treatment session.  Word finding strategies    HEP: Emotional hygiene strategies, use of breathing exercises    PROGRESS SUMMARY:   Pt has completed a total of 10 SLP sessions making excellent progress towards short and long term goals evidenced by pt meeting 2/3 short term goals and 1/3 long term goals. This certification period has largely focused on introduction of strategies including cognitive pacing, attention, processing, and word finding strategies. Pt has demonstrated a clear understanding of strategies through verbalizations of strategies and the implementation of these strategies in environments in which she is comfortable/familiar. Given the complexity of her current medical situation, it is important for her to be able to utilize these strategies during medical appointments, conversation with medical professionals, and in emergent situations. It is recommended pt continue skilled SLP intervention with a focus on strategy use to assist with management of medical appointments and information. New goals will reflect focus on reading strategies, word finding and processing strategy use during medical appointments, and an improved score on the PROMIS Item Bank v2.0 - Cognitive  Function - Short Form 8a to show pt's perception of cognitive impairments improving.     ASSESSMENT: Batina Lohrey is a 43 y.o. female presents to outpatient therapy today with mild cognitive communication impairments, mild memory impairments, and mild executive function impairments. These deficits impact the pt's ability to complete functional tasks in the home such as cooking, organizing a list, reading complex material, and recalling medical information. Pt warrants skilled SLP intervention to address deficits and train cognitive linguistic compensatory strategies for increased independence in functional tasks.     The following impairments were  noted:  Mild cognitive communication impairment, as characterized by deficits in word finding in conversation, phonemic fluency, comprehension of basic and complex written material, and problem solving.  Mild memory impairment, as characterized by impairments in delayed recall of new information.   Mild executive function and attention impairment, as characterized by impairments in sustained attention, alternating/divided attention, and planning/organizing.      Patient presents with the following functional limitations:  Patient has difficulty with communicating at the level of conversation.  Unable to effectively implement attention / memory strategies for sustained attention, alternating/divided attention, and recall of new information.  Difficulty with executive function skills such as  problem solving and planning/organizing.     Without intervention, patient is at risk for:  Decreased social interactions that may lead to further cognitive decline and contribute to further medical complications and increased fall risk.  Inability to hold current job position and loss of income.  Burden of care.  Depression.    Patient is progressing well toward above mentioned functional goals.    PLAN:   It is medically necessary for Claudeen Crutch to recieve skilled speech therapy intervention in order to address the above impairments and functional limitations.    Recommend skilled SLP intervention 1x/wk for 12 weeks per plan of care to work toward increased cognitive and communication abilities for safety and independence with daily activities and in the community.     NEW GOALS:  Short Term Goals:  To be met by 6 weeks from 03/14/2024   Pt will utilize attentive reading and constrained summarization when reading short paragraphs (5-7 sentences) of material relevant to their interests and daily life (personal letters, news, magazine articles, etc.) and answer questions regarding material with 90% acc when provided  with min cues across 2 sessions to improve reading comprehension.     Long Term Goals:  To be met by 12 weeks from 03/14/2024  Pt will report improved cognitive communication skills demonstrated by an increased score on patient reported outcome measure (PROMIS Item Bank v2.0 - Cognitive Function - Short Form 8a) from a baseline score of 16/40.  Pt will verbalize at least 2 processing strategies (i.e. requesting clarification, requesting repetition, paraphrasing information, etc.) and describe at least 2 situation where strategies were utilized during functional tasks across a 7 day period (e.g. medical appointments, phone calls, etc.), based on pt report.   Pt will verbalize at least 3 word finding strategies (e.g. extra time, script training, synonyms, etc.) and describe at least 2 situations where strategies were utilized during functional tasks (e.g.conversation with husband, friends, medical professionals, etc.), based on pt report.     NEXT VISIT: Focus on internal and external memory strategies, review processing strategies, review word finding strategies, introduce reading strategies    Therapist Signature:    Cherylynn Cosier M.A., CCC-SLP  Speech Language Pathologist  [facility]  Outpatient Specialty Rehabilitation  [email or web address]  03/14/2024         [1]   Allergies  Allergen Reactions    Fruit Blend Flavor [Flavoring Agent (Non-Screening)] Anaphylaxis and Swelling     Tropical Fruit     Latex Drug-Induced Flushing, Hives, Other (See Comments), Swelling, Itching and Rash    Cyanoacrylate Itching and Rash    Other Itching and Rash     Sutures Monocryl    Oxycodone Hallucinations, Hives and Other (See Comments)     Hallucinations, took 1 time 25 years ago    Wound Dressing Adhesive Itching and Other (See Comments)     adhesive

## 2024-03-15 ENCOUNTER — Telehealth (INDEPENDENT_AMBULATORY_CARE_PROVIDER_SITE_OTHER): Payer: BLUE CROSS/BLUE SHIELD | Admitting: Internal Medicine

## 2024-03-15 ENCOUNTER — Encounter (INDEPENDENT_AMBULATORY_CARE_PROVIDER_SITE_OTHER): Payer: Self-pay | Admitting: Internal Medicine

## 2024-03-15 ENCOUNTER — Other Ambulatory Visit: Payer: Self-pay

## 2024-03-15 DIAGNOSIS — M352 Behcet's disease: Secondary | ICD-10-CM

## 2024-03-15 DIAGNOSIS — Z6841 Body Mass Index (BMI) 40.0 and over, adult: Secondary | ICD-10-CM

## 2024-03-15 DIAGNOSIS — Z79899 Other long term (current) drug therapy: Secondary | ICD-10-CM

## 2024-03-15 DIAGNOSIS — M0600 Rheumatoid arthritis without rheumatoid factor, unspecified site: Secondary | ICD-10-CM

## 2024-03-15 DIAGNOSIS — K120 Recurrent oral aphthae: Secondary | ICD-10-CM

## 2024-03-15 DIAGNOSIS — H209 Unspecified iridocyclitis: Secondary | ICD-10-CM

## 2024-03-15 DIAGNOSIS — K760 Fatty (change of) liver, not elsewhere classified: Secondary | ICD-10-CM

## 2024-03-15 MED ORDER — METHOTREXATE SODIUM 2.5 MG PO TABS
10.0000 mg | ORAL_TABLET | ORAL | 1 refills | Status: AC
Start: 2024-03-15 — End: 2024-08-30

## 2024-03-15 MED ORDER — FOLIC ACID 1 MG PO TABS
1000.0000 ug | ORAL_TABLET | Freq: Every day | ORAL | 1 refills | Status: DC
Start: 2024-03-15 — End: 2024-06-10

## 2024-03-15 MED ORDER — OTEZLA 30 MG PO TABS
1.0000 | ORAL_TABLET | Freq: Two times a day (BID) | ORAL | 1 refills | Status: DC
Start: 2024-03-15 — End: 2024-09-10

## 2024-03-15 NOTE — Patient Instructions (Addendum)
 Dear Hailey [name] ,     Thanks for arranging a video visit with me.    VISIT SUMMARY:  You had a follow-up visit to manage your Behet's syndrome and associated symptoms. We reviewed your recent lab results and imaging studies, and discussed your current treatment plan and any side effects you are experiencing.    YOUR PLAN:  -BEHET'S SYNDROME: Behet's syndrome is an inflammatory disorder that affects multiple parts of the body. We will increase your methotrexate  dose to four tablets once a week.  We will monitor your liver function with labs in four to six weeks due to the increased dosage. Your prescriptions for methotrexate  and Otezla  will be sent to [facility]. Please follow up in three months to assess your response to the treatment.    -ANKLE TENOSYNOVITIS: Ankle tenosynovitis is inflammation of the tendons around your ankle, causing swelling and discomfort. Your Doppler ultrasound was normal, ruling out a blood clot, but your MRI showed soft tissue swelling and tendon inflammation. Please follow up with your ankle foot specialist to discuss potential biopsy and further management options.    -SI JOINT PAIN: SI joint pain is pain in the sacroiliac joint, which connects your lower spine to your pelvis. Your current pain management regimen includes a buprenorphine patch, which has been effective but causes nausea and daytime sleepiness. Continue with your current pain management regimen and follow up with your pain management doctor in a week and a half to evaluate your pain management strategy and discuss the potential for the next round of injections.      INSTRUCTIONS:  Please follow up in three months to assess your response to the increased methotrexate  dosage. Additionally, follow up with your pain management doctor in a week and a half to evaluate your pain management strategy and discuss potential alternatives to the buprenorphine patch. Also, follow up with your ankle foot specialist to  discuss potential biopsy and further management options for your ankle tenosynovitis. Labs to monitor your liver function should be done in four to six weeks.        Here are things I'd like you to do following today's visit:  Please have blood taken for labs with your next infusion  Medication instructions:  Please continue Otezla  30 mg twice daily  Please increase methotrexate  to 4 tablets weekly  Please continue taking folica cid 1 mg daily  Please continue Infliximab  infusions    You may retrieve any orders from this visit in Mychart by using these steps:    After signing into Mychart  1) click Messaging  2) click Letters  3) select the Requisition  4) Review and click print    Please contact the clinic at [phone] with any questions and to schedule your follow up visit.    Sincerely,    Geni Derby, MD  Rheumatologist  [facility]  [address]  [place], [place] [address]  Phone: [phone]  Fax: [phone]

## 2024-03-15 NOTE — Progress Notes (Signed)
 The prescription has been received by the Pharmacy Care Team. A benefit investigation is currently in process.    To follow up on the outcome of the benefit investigation, please check Episodes, Referrals or specialty pharmacy encounter section in the Patient chart review

## 2024-03-15 NOTE — Progress Notes (Signed)
 RHEUMATOLOGY FOLLOW UP NOTE  Telemedicine Documentation Requirements  Originating site (Patient location): home  Distant site (Provider location): Office  Provider and Title: Dr. Woodard  Type of Visit: Video Visit  Verbal Consent has been obtained to conduct a telemedicine visit on this date in order to minimize exposure to COVID-19.        PCP: [name], MD    HPI:    This patient is a 43 y.o. year female with Past Medical History of Prior Cholecystectomy, Wold-Parkinson White, Prior Hysterectomy who returns for follow up of Behcet's HLAB51+, recurrent oral ulcers, pathergy, low grade fevers and seronegative arthritis.  -ablation procedure for arrythmia 06/2022  -She is on Humria weekly for inflammatory arthritis from Behcets  -Otezla  for recurrent aphthous ulcers  -She is on dapsone from Dermatology, clindamycin  at night  Dermatologist started her on dapsone-she has had a few scars that are healing-had injections from her dermatologist.  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    She is currently on methotrexate  5 mg weekly and recently received a Remicade  infusion on February 06, 2024. Recent lab work from the same date showed CRP less than 0.1, ESR 3, WBC 11.19, hemoglobin 15.2, hematocrit 44.3, and platelet count 285. Her comprehensive metabolic panel was normal with creatinine 0.9, AST 27, ALT 37, and alkaline phosphatase 43. Infliximab  antibodies were low. A lower extremity venous ultrasound on Mar 07, 2024, showed no DVT.    She experiences significant pain from her SI joint, which led to a consultation with a pain management provider. Due to the severity of her pain, she was prescribed a pain patch, which has caused side effects such as nausea and daytime sleepiness. Despite these side effects, she  notes a reduction in pain.    She has issues with her ankle, for which she saw an ankle foot specialist. An MRI from February showed no significant fluid accumulation, but there is soft tissue swelling and tendon involvement. A Doppler ultrasound was performed to rule out a blood clot, which was normal. She is unable to wear the same size shoe due to swelling.    Her medication regimen includes methotrexate , Remicade , Otezla  30 mg twice a day, and a reduced dose of Depakote , now at three pills daily instead of four. She reports no significant change in symptoms with the reduction of Depakote  but is relieved to have decreased the dose due to previous adverse reactions. She is also using a buprenorphine patch for pain management.    Results  LABS  CRP: <0.1 (02/06/2024)  ESR: 3 (02/06/2024)  WBC: 11.19 (02/06/2024)  Hb: 15.2 (02/06/2024)  Hct: 44.3 (02/06/2024)  PLT: 285 (02/06/2024)  Cr: 0.9 (02/06/2024)  AST: 27 (02/06/2024)  ALT: 37 (02/06/2024)  Alkaline phosphatase: 43 (02/06/2024)  Infliximab  antibodies: low (02/06/2024)    RADIOLOGY  Lower extremity venous ultrasound: No DVT (03/07/2024)  Ankle MRI: Soft tissue swelling, acute inflammation in tendon, chronic tendon changes (12/2023)            Previous Labs:  12/12/23  CMP AST/ALT 45/74  ESR wnl  CRP wnl  Infliximab  level 13  CBC WBC 14; H/H 15.5/45.9      Previous Labs:  10/17/23  CMP ALT 69  ESR wnl  CRP wnl  CBC  WBC 10        Imaging Reports Reviewed:  01/03/24  MRI left ankle.    Previous Imaging:  10/16/23  CT L Spine:  FINDINGS:   Scoliotic alignment is present. On sagittal imaging, there is no  significant listhesis. Vertebral body heights are maintained. There is no  fracture. There is no lytic or blastic lesion.     Endplate spurring is present throughout the lumbar spine. Varying degrees  of disc height loss are present. There is no obvious large disc bulge or  protrusion. Facet and spinous process hypertrophy noted.     The surrounding soft tissues are  normal.     IMPRESSION:      1. No acute process, stable appearance.  2. No fracture.  3. Degenerative findings are present but there is no appreciable large disc  bulge or protrusion      Previous Labs:  08/22/23  CRP wnl  ESR wnl  CMP AST/ALT 02/08/81  Infliximab  Ab 14  Valproic Acid  9.0      Previous Labs:  06/26/23  Inflizimab level pending  Valproic Acid  level 75  CRP wnl  ESR wnl  CMP AST/ALT 53/93    Previous Labs:  05/01/23  Infliximab  level wnl  CRP wnl  ESR 14  CMP wnl  CBC wnl    Previous Imaging:  07/25/23:  FINDINGS:     Right ankle/foot: No significant joint effusion or synovitis or marginal  erosion is noted in the right ankle, midfoot, or forefoot. No tenosynovitis  noted.     Left ankle/foot: No significant joint effusion, synovitis, or marginal  erosion is noted in the left ankle, midfoot, or forefoot. No tenosynovitis  noted.     IMPRESSION:         1.No joint effusion, synovitis, or obvious joint erosion in either right or  left ankle/foot    Previous Imaging:  03/01/23  MRI Brain:  FINDINGS:      The brain parenchyma demonstrates no significant abnormality. There is no  abnormal parenchymal enhancement. No parenchymal mass lesion, acute  parenchymal hemorrhage, or acute infarction.     The ventricles, sulci, and cisterns are unremarkable in appearance.     The flow voids of the major intracranial vessels appear intact.     The bones and extracranial soft tissues are unremarkable.     IMPRESSION:      No intracranial mass, acute hemorrhage, acute infarction, or evidence of  hydrocephalus. Normal MRI of the brain    02/10/23:  Neck US :  Narrative & Impression   HISTORY: Neck swelling. Enlarged cervical lymph nodes.      COMPARISON: 01/28/2023     TECHNIQUE: Targeted soft tissue ultrasound of the area of concern, in the  area of both parotid glands and at level 1.     FINDINGS:   No enlarged or otherwise abnormal appearing lymph node, other mass, fluid  collection, or other focal abnormality. The parotid  glands appear symmetric  and normal in echotexture with no discrete mass.     IMPRESSION:      Normal exam         Previous Labs:  03/21/23  Surgical pathology A.  GASTRIC BIOPSY:          MINIMAL SUPERFICIAL CHRONIC INACTIVE INFLAMMATION, OTHERWISE     UNREMARKABLE; H. PYLORI NOT IDENTIFIED ON H&E STAIN        B.  COLON RANDOM BIOPS:     UNREMARKABLE COLONIC MUCOSA  03/06/23  CRP wnl  ESR 35  CMP wnl  CBC unremarkable    Previous Labs:  02/06/23  CRP wnl  ESR 24  CMP wnl  CBC WBC 10.96    Previous Labs:  11/25/22  Procalcitonin wnl  CMP wnl  CBC unremarkable      Previous Labs:  08/29/22  CBC platelets 350  BMP unremarkable  ESR wnl  CRP wnl      Previous Imaging:  12/16/22  MRI Lumbar Spine:  IMPRESSION:         1.  No acute osseous or soft tissue abnormality.  2.  Normal MR appearance of the distal spinal cord, conus medullaris and  cauda equina.  3.  Rotatory lumbar levoscoliosis.  4.  Lumbar spondylosis and facet arthrosis without central stenosis or  nerve root compression. Please see full details and segmental analysis  above.    CT Chest Angio:  11/23/22  FINDINGS:      LINES/TUBES: None.     LUNGS: No consolidation, edema or mass. There is linear atelectasis at the  lung bases.  PLEURA: No pleural effusions or pneumothorax.     HEART: Not enlarged.  MEDIASTINUM: No axillary, hilar or mediastinal lymphadenopathy.  PULMONARY ARTERIES: No acute pulmonary emboli.  AORTA: No aneurysm or dissection.     UPPER ABDOMEN: Hepatic steatosis. No acute abnormality.     BONES AND SOFT TISSUES: Unremarkable.     IMPRESSION:      No acute pulmonary embolism.    CT A/P  11/23/22:  FINDINGS:      LINES/TUBES: None.     LOWER THORAX: Bibasilar atelectasis.     LIVER/BILIARY TREE: The gallbladder is absent. There is hepatic steatosis.  No biliary dilation.  SPLEEN: No splenomegaly.  PANCREAS: No pancreatic mass or duct dilatation.     KIDNEYS/URETERS: No hydronephrosis, stones or solid mass lesions.  ADRENALS: No adrenal  mass.  PELVIC ORGANS/BLADDER: No pelvic masses. The bladder is partially  distended.     PERITONEUM/RETROPERITONEUM: No free air or fluid.  LYMPH NODES: No lymphadenopathy.  VESSELS: No aortic aneurysm.     GI TRACT: No bowel wall thickening or dilation. Normal appendix.     BONES AND SOFT TISSUES: There are degenerative changes in the spine. No  suspicious or destructive osseous lesion.     IMPRESSION:      No acute abnormality.    Previous Imaging:      09/19/22:  ECHO:  Left Ventricle    The left ventricle is normal in size. There is normal left ventricular  geometry. Left ventricular systolic function is normal with an ejection  fraction by Biplane Method of Discs of  60 %.  3D LVEF 58%. Left ventricular  segmental wall motion is normal. Left ventricular diastolic filling parameters  are consistent with Grade I diastolic dysfunction (impaired relaxation  pattern).  Right Ventricle    The right ventricular cavity size is normal in size. Normal right  ventricular systolic function.    08/27/22  MRI left Hip:  IMPRESSION:      1.Trace left hip joint effusion without significant synovitis. Tear of the  left anterosuperior acetabular labrum. Otherwise unremarkable left hip  joint.     2.Mild to moderate left distal gluteus minimus tendinosis and mild to  moderate left greater trochanteric bursitis with bursal edema. There is  mild left proximal hamstring tendinosis.      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  4.There has been interval enlargement of a left adnexal cyst since pelvic  ultrasound 03/18/2022, now measuring 3.3 cm. This could be further  evaluated with follow-up pelvic ultrasound.     5.Unchanged 3.8 cm cyst adjacent to the vaginal cuff post hysterectomy  since multiple prior exams favoring a benign etiology.      Previous Labs:  07/25/22  ESR 37  CRP wnl  CMP ALt 39  CBC wnl      Previous Imaging:  04/12/22  Cardiac MRI:  IMPRESSION:       1. No delayed gadolinium  enhancement to suggest myocardial fibrosis or  scarring.  2. Normal right ventricular systolic function.  3. Qualitatively mild global left ventricular hypokinesis. Left ventricular  systolic function measures 32% and may be artifactually reduced secondary  to significant motion artifact.    Labs Reviewed:  04/19/22  Throat culture no B-hemolytic strep  POCT flu A/B  Abbot -      Previous Labs:  01/14/22  CMP wnl  CRP wnl  ViT D 31  ESR wnl  Lipid panel wnl  TSH wnl      08/18/21  CBC wnl  CMP wnl  ESR wnl  CRP wnl    Previous Labs:  05/25/21  Quant Gold-  HepbSAb wnl  HepbSAg-  HepCAb-  HIV-        Previous Labs:  02/23/21  ESR wnl  CRP wnl  CMP ALT 42, AST normal    CRP wnl  ESR wnl  HLAB51 positive  IgE, IgA, IGG, IgM all wnl  CBC wnl    12/03/20  DIAGNOSIS:          A. Lesion, ulnar wrist, excision:     -Fibroadipose tissue and vasculature          B. Synovial tissue, biopsy:     -Fibroadipose tissue and synovium (see comment)          Comment:     Morphologic features typical of rheumatoid arthritis or infection are     not identified.    12/03/20  Bacterial Cultures: No Growth  Fungal Cultures: NG at one week  AFB: No growth    11/20/20  ESR 50  CRP wnl  IgG subclasses wnl  ANA comprehensive panel negative        Previous Labs:    [facility] Labs:  10/23/20  C-ANCA, P-ANCA, MPO, PR3 all negative  Mitochondrial Ab-  ANA IFA-  Scl70-  Centromere-  RNP-  SMith-  TPPA Ab-      05/18/20  Ferritin 291  CMP AST/A:T 59/70  CBC elevated lymphocytes    02/06/20  MPO-  PR3-  SSA-  aCL IgM 14  aCL IgG, IgA -  Lupus anticoagulant -  B2IgG, IgM -  ACE wnl  CCP-  RF-  Vit D 31  TSH wnl  ESR wnl  CK wnl  C4 wnl  C3 wnl  CRP wnl    07/2019  ANA Screen-  LDH wnl    07/23/2018 lymph node biopsy:DIAGNOSIS:          Lymph node, deep right neck, excisional biopsy:     -Lymph node with non-specific reactive changes   Wound, AFB and Fungal cultures negative at this time    BONE MARROW 05/03/2019      BONE MARROW, LEFT POSTERIOR ILIAC CREST, CORE  BIOPSY, CLOT SECTION,    ASPIRATE SMEAR AND TOUCH PREP:        - NORMOCELLULAR BONE MARROW (approximate50-60%)  WITH TRILINEAGE HEMATOPOIESIS,  NORMAL MYELOID TO ERYTHROID RATIO AND ADEQUATE NUMBERS OF    MEGAKARYOCYTES WIT H UNREMARKABLE MORPHOLOGY        - A FEW SMALL REACTIVE LYMPHOID AGGREGATES IDENTIFIED, AND MILDLY  INCREASED NUMBERS OF EOSINOPHILS        - PENDING CYTOGENETIC STUDIES OF BONE MARROW SAMPLE  - Remain pending as of 05/14/2019.        COMMENT:    The concurrent flow cytometry performed on the bone marrow aspirate  sample shows no evidence of immunophenotypic abnormalities      Imaging Reviewed:    08/16/20  MRI Brain:IMPRESSION:      1. No intracranial mass, hemorrhage, or hydrocephalus is detected.  2. There is normal pattern of enhancement of the brain parenchyma and  meningeal surfaces.    9/18 MRI Abdomen:  IMPRESSION:      1. Diffuse hepatic steatosis. No suspicious focal hepatic lesions.  2. No biliary ductal dilatation or choledocholithiasis.    02/03/20  MRI Pelvis:  IMPRESSION:         Cystic structure at the vaginal cuff, flank by both ovaries not  significantly changed in size compared to 2019. No abnormal internal  enhancement. This favors a benign process such as a peritoneal inclusion  cyst or a paraovarian cyst.    12/14/19 MRI R Hand:  IMPRESSION:      1.  No suspicious soft tissue mass, osseous mass, or masslike  enhancement in the right hand or wrist to correlate with the diffuse FDG  uptake on recent PET/CT.  2.  There is mild enhancing synovitis in the right wrist joint without  active joint erosion or bony destruction.  3.  A small dorsal ganglion cyst overlying the scapholunate interval.    12/06/19  Full Body PET Scan:    Hypermetabolic soft tissue mass in palmar R hand surface at base of the second digit-stable pulmonary nodulke 1.3 cm left lower lobe-recommend follow up Chest CT 3 months         The following sections were reviewed this encounter by the provider:   Tobacco   Allergies  Meds  Problems  Med Hx  Surg Hx  Fam Hx         PMH/PSH:  Past Medical History:   Diagnosis Date    Abdominal hernia 2016    Resolved by surgical repair    Abnormal vision March 2024    reading glasses    Amenorrhea 1999    Resolved by hysterectomy    Anxiety     Arrhythmia     WPW ablation in 2005, 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 Released from cardiology    Atrial flutter (CMS/HCC) 20+ years    Previous history of WPW    Behcet's disease (CMS/HCC)     She sees Dr. 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    02/11/23 stable on medical    Diarrhea     Dizziness 15+ years    Allergies, migraine    Ear, nose and throat disorder     seasonal allergies    Fatigue 2019    Bechet's    Fever 11/09/2022    Recurrent fever due to Behcet's Disease    Fracture of hand 2016    Right hand middle finger  Hard to intubate 2019    Headache 2002    migraine    History of adverse reaction to anesthesia 2005    Nausea    History of ulcer disease 2019    Bechets Disease    Hx of ovarian cyst     Lower back pain     not severe    Meningitis 2011    Viral // treated and resolved    Migraine 2002    Near syncope 1995    Previous history of WPW    Other physical therapy March 2023    Neck    Pain in joint 2019    RA part of Bechets Disease    Pain in wrist 11/09/2022    Palpitations 20+ years    Previous history of WPW    Post-operative nausea and vomiting 2005    Rash 2019    Behcets disease - controlled by medication    Rheumatoid arthritis (CMS/HCC) 2019    Part of Behcets Disease    Scoliosis 1985    moderate// used a Brace at a young age    Shingles 2015    Swelling of extremity 2019    Left ankle    Uveitis     using eyedrops        Past Surgical History:   Procedure Laterality Date    ABDOMINAL SURGERY  2016; 2012    Umbilical hernia; hysterectomy    ABLATION - PVC'S N/A 06/08/2022     Procedure: Ablation - PVC's;  Surgeon: Von Oh, MD;  Location: [facility];  Service: Cardiovascular;  Laterality: N/A;  SAME DAY DISCHARGE  CARTO NO TEE    ARTHROTOMY, WRIST Right 12/03/2020    Procedure: RIGHT UPPER EXTREMITY SYNOVIAL BIOPSY;  Surgeon: Bolling Oh, MD;  Location: [facility];  Service: Plastics;  Laterality: Right;    BIOPSY, LYMPH NODE N/A 07/23/2018    Procedure: BIOPSY, LYMPH NODE;  Surgeon: Gloris Wilkie FALCON, MD;  Location: [facility];  Service: ENT;  Laterality: N/A;  NECK DEEP LYMPH NODE BIOPSY    BONE MARROW BIOPSY  2020    led to behcet's dx    CARDIAC ABLATION  2005    [facility]  at [facility] for Wolff-Parkinson-White    CARDIAC CATHETERIZATION  2005    CHOLECYSTECTOMY  2009    COLONOSCOPY, DIAGNOSTIC (SCREENING) N/A 03/21/2023    Procedure: COLONOSCOPY, DIAGNOSTIC (SCREENING);  Surgeon: Leontine Countryman, MD;  Location: [facility];  Service: Gastroenterology;  Laterality: N/A;    DILATION AND CURETTAGE OF UTERUS  2010    EGD N/A 03/21/2023    Procedure: EGD;  Surgeon: Leontine Countryman, MD;  Location: [facility];  Service: Gastroenterology;  Laterality: N/A;    FINGER GANGLION CYST EXCISION  04/2018    FOOT SURGERY  2014    Ganglion cyst removal from Achilles    GANGLION CYST EXCISION  2014    04/2018 cyst removed [facility] index finger-[facility]    HAND SURGERY  January 2022    HERNIA REPAIR  2016    HYSTERECTOMY  2012    HYSTEROSCOPY  2011    indication: Abnormal bleeding    LAPAROSCOPIC, LYSIS, ADHESIONS N/A 11/09/2022    Procedure: LAPAROSCOPIC LYSIS OF ADHESIONS;  Surgeon: Jan, Ambareen G, MD;  Location: [facility];  Service: Gynecology;  Laterality: N/A;    LAPAROSCOPIC, SALPINGECTOMY Left 11/09/2022    Procedure: LAPAROSCOPIC, SALPINGECTOMY LEFT;  Surgeon: Madison Sedalia MATSU, MD;  Location: [facility];  Service: Gynecology;  Laterality: Left;    LAPAROSCOPY, DIAGNOSTIC N/A 11/09/2022    Procedure: LAPAROSCOPY, DIAGNOSTIC;  Surgeon: Madison Sedalia MATSU, MD;  Location: [facility];  Service: Gynecology;  Laterality: N/A;  [id number]    UMBILICAL HERNIA REPAIR  2016    UPPER GASTROINTESTINAL ENDOSCOPY  2009    UPPER GASTROINTESTINAL ENDOSCOPY  2009    WISDOM TOOTH EXTRACTION  1998        FH/SH:  Family History   Adopted: Yes       Social History     Tobacco Use    Smoking status: Never     Passive exposure: Never    Smokeless tobacco: Never   Vaping Use    Vaping status: Never Used   Substance Use Topics    Alcohol use: Never    Drug use: Never        Meds/ Allergies:  No outpatient medications have been marked as taking for the 03/15/24 encounter (Telemedicine Visit) with Woodard Geni HERO, MD.     Current Facility-Administered Medications for the 03/15/24 encounter (Telemedicine Visit) with Woodard Geni HERO, MD   Medication Dose Route Frequency Provider Last Rate Last Admin    onabotulinumtoxin A (BOTOX ) injection 175 Units  175 Units Intramuscular Q12 Weeks Kastl, Charlotte M, MD   175 Units at 08/16/23 1013     Allergies   Allergen Reactions    Fruit Blend Flavor [Flavoring Agent (Non-Screening)] Anaphylaxis and Swelling     Tropical Fruit     Latex Drug-Induced Flushing, Hives, Other (See Comments), Swelling, Itching and Rash    Cyanoacrylate Itching and Rash    Other Itching and Rash     Sutures Monocryl    Oxycodone Hallucinations, Hives and Other (See Comments)     Hallucinations, took 1 time 25 years ago    Wound Dressing Adhesive Itching and Other (See Comments)     adhesive          PHYSICAL EXAM  General- WNWD, alert and oriented, NAD  Eyes- EOMI, PERRL, no conjunctival injection, no scleral icterus  ENMT- face symmetric without lesions  Skin- no rash, no alopecia  Pulm: No conversational dyspnea, breathing comfortable on room air  Neuro - no notable neurological deficits, no facial asymmetry          LABS:  Lab Results   Component Value Date    WBC 11.19 (H) 02/06/2024    HGB 15.2 (H) 02/06/2024    HCT 44.3 (H) 02/06/2024    MCV 100.0 (H) 02/06/2024     PLT 285 02/06/2024      Lab Results   Component Value Date    CREAT 0.9 02/06/2024    BUN 12 02/06/2024    NA 142 02/06/2024    K 4.9 02/06/2024    CL 107 02/06/2024    CO2 23 02/06/2024      Lab Results   Component Value Date    ALT 37 02/06/2024    AST 27 02/06/2024    ALKPHOS 43 02/06/2024    BILITOTAL 0.4 02/06/2024     Lab Results   Component Value Date    ESR 3 02/06/2024    ESR 5 12/12/2023    ESR 3 10/17/2023    ESR 2 08/22/2023    ESR 8 06/26/2023      Lab Results   Component Value Date    CRP <0.1 02/06/2024    CRP <0.1 12/12/2023    CRP <0.1 10/17/2023  CRP <0.1 08/22/2023    CRP <0.1 06/26/2023      No results found for: ANA   Lab Results   Component Value Date    RHEUMFACTOR <15.0 02/06/2020      Lab Results   Component Value Date    CCP <15.6 02/06/2020     No results found for: URICACID        ASSESSMENT/PLAN:    This patient is a 43 y.o. year female with Past Medical History of Prior Cholecystectomy, Wolf-Parkinson White, Prior Hysterectomy, Fatty Liver Disease who returns for follow up of Behcet's Disease characterized by seronegative rheumatoid arthritis complicated with recent uveitis for which she was switched to Remicade  from humira .    Assessment & Plan  Behet's syndrome  Fevers  HLAB51  Uveitis  Inflamamtory arthritis  Pathergy  Neuro behcets, seizures  Behet's syndrome managed with Remicade , methotrexate , and Otezla . Normal liver enzymes suggest potential for methotrexate  dosage increase to help prevent developing resistance   - Increase methotrexate  to four tablets once a week.  - Order labs in four to six weeks to monitor liver function due to methotrexate  dosage increase.  - Send prescription for methotrexate  and Otezla  to [facility].  - Following with Dr. ROSALEA for uveitis monitoring  - Following with Dr. [name] for Neuro Behcets/Seizures  - Follow up in three months to assess response to treatment.    Ankle tenosynovitis  Ankle tenosynovitis with swelling and discomfort. Doppler  ultrasound normal, ruling out blood clot. MRI indicates soft tissue swelling and acute tendon inflammation.   - Follow up with ankle foot specialist to discuss potential biopsy and further management options.  Would be unlikely to be flare of underlying behcts arthritis on rpednisone 10 mg daily, MTX, Otezla  and Remicade .  Would be concerning for subacute opportunistic infection or other pathology, perhapy synovial biopsy can be considered    High Risk med Use  LFTs normalized, increasing MTX and will re-check labs              1. Behcet's disease (CMS/HCC)  - Apremilast  (Otezla ) 30 MG Tablet; Take 1 tablet (30 mg) by mouth 2 (two) times daily  Dispense: 180 tablet; Refill: 1  - folic acid  (FOLVITE ) 1 MG tablet; Take 1 tablet (1,000 mcg) by mouth once daily  Dispense: 90 tablet; Refill: 1  - methotrexate  2.5 MG tablet; Take 4 tablets (10 mg) by mouth once a week  Dispense: 48 tablet; Refill: 1  - CBC with Differential (Order); Future  - Comprehensive Metabolic Panel; Future  - C Reactive Protein; Future  - Sedimentation Rate (ESR); Future  - Quantiferon(R) - TB Gold Plus; Future    2. Aphthous ulcer  - Apremilast  (Otezla ) 30 MG Tablet; Take 1 tablet (30 mg) by mouth 2 (two) times daily  Dispense: 180 tablet; Refill: 1  - CBC with Differential (Order); Future  - Comprehensive Metabolic Panel; Future  - C Reactive Protein; Future  - Sedimentation Rate (ESR); Future    3. Fatty liver disease, nonalcoholic  - Apremilast  (Otezla ) 30 MG Tablet; Take 1 tablet (30 mg) by mouth 2 (two) times daily  Dispense: 180 tablet; Refill: 1  - CBC with Differential (Order); Future  - Comprehensive Metabolic Panel; Future  - C Reactive Protein; Future  - Sedimentation Rate (ESR); Future    4. Seronegative rheumatoid arthritis (CMS/HCC)  - Apremilast  (Otezla ) 30 MG Tablet; Take 1 tablet (30 mg) by mouth 2 (two) times daily  Dispense: 180 tablet; Refill: 1  -  folic acid  (FOLVITE ) 1 MG tablet; Take 1 tablet (1,000 mcg) by mouth once daily   Dispense: 90 tablet; Refill: 1  - methotrexate  2.5 MG tablet; Take 4 tablets (10 mg) by mouth once a week  Dispense: 48 tablet; Refill: 1  - CBC with Differential (Order); Future  - Comprehensive Metabolic Panel; Future  - C Reactive Protein; Future  - Sedimentation Rate (ESR); Future    5. High risk medication use  - folic acid  (FOLVITE ) 1 MG tablet; Take 1 tablet (1,000 mcg) by mouth once daily  Dispense: 90 tablet; Refill: 1  - methotrexate  2.5 MG tablet; Take 4 tablets (10 mg) by mouth once a week  Dispense: 48 tablet; Refill: 1  - CBC with Differential (Order); Future  - Comprehensive Metabolic Panel; Future  - C Reactive Protein; Future  - Sedimentation Rate (ESR); Future  - Quantiferon(R) - TB Gold Plus; Future    6. Uveitis  - folic acid  (FOLVITE ) 1 MG tablet; Take 1 tablet (1,000 mcg) by mouth once daily  Dispense: 90 tablet; Refill: 1  - methotrexate  2.5 MG tablet; Take 4 tablets (10 mg) by mouth once a week  Dispense: 48 tablet; Refill: 1  - CBC with Differential (Order); Future  - Comprehensive Metabolic Panel; Future  - C Reactive Protein; Future  - Sedimentation Rate (ESR); Future    7. Body mass index (BMI) 50.0-59.9, adult (CMS/HCC)                                         Total Time spent reviewing records, interviewing patient and coordinating plan of care: 34 min    FOLLOW UP:  Return in about 3 months (around 06/15/2024) for video OK, 40 minutes.                                                                                  Geni Derby, MD  Rheumatologist  [facility]  [address]  [place], [place] [address]  Phone: [phone]  Fax: [phone]

## 2024-03-19 ENCOUNTER — Encounter: Payer: Self-pay | Admitting: Family Nurse Practitioner

## 2024-03-19 ENCOUNTER — Ambulatory Visit: Payer: BLUE CROSS/BLUE SHIELD | Attending: Neurology | Admitting: Family Nurse Practitioner

## 2024-03-19 VITALS — BP 130/92 | HR 109 | Resp 18 | Ht 68.0 in

## 2024-03-19 DIAGNOSIS — R569 Unspecified convulsions: Secondary | ICD-10-CM

## 2024-03-19 DIAGNOSIS — F458 Other somatoform disorders: Secondary | ICD-10-CM

## 2024-03-19 DIAGNOSIS — M352 Behcet's disease: Secondary | ICD-10-CM

## 2024-03-19 DIAGNOSIS — G43711 Chronic migraine without aura, intractable, with status migrainosus: Secondary | ICD-10-CM

## 2024-03-19 MED ORDER — ONABOTULINUMTOXINA 200 UNITS IJ SOLR
155.0000 [IU] | INTRAMUSCULAR | Status: DC
Start: 2024-03-19 — End: 2024-06-11
  Administered 2024-03-19: 175 [IU] via INTRAMUSCULAR
  Filled 2024-03-19: qty 200
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
## 2024-03-19 NOTE — Patient Instructions (Signed)
Our plan:     Discussed Botox for preventative treatment of chronic migraine; discussed benefits and potential risks; discussed how the medication is administered including frequency and possible time to appreciate any noticeable benefits or effects.     -You were injected with Botox today    -You may have pain and discomfort at the injection sites    -Please do not rub the injection sites    -Common side effects include headache pain, neck pain, or weakness    -If you have any side effects of concerns, please call the office and let me know    -Please track your headache days using a headache log and bring that information to your follow up visit    -Please return in 12 weeks for repeat Botox injection      Today's Visit:      In today's visit, I reviewed your medications and records relating your health - prior testing, blood work, reports of other health care providers present in your electronic medical record.     If you have pertinent records from any non-[facility] doctors that you would like to review, please have them sent to us or bring to them to your next office visit.     A copy of today's visit will be sent to your referring doctor and/or primary care doctor, if you have one listed in our system.    Let me know if there are things we could have done better for your office visit.    Patient satisfaction survey:      If you receive a patient satisfaction survey, I would greatly appreciate it if you would complete it. We value your feedback.     Contact me online:      Patient Portal online - Please sign up for MyChart -- this is the best way to communicate with your team here.  There is a messaging feature, where you can us messages anytime of day.  It is the best way to communicate with us and get test results, medication refills, or ask questions.    If you are having a medical emergency -- call 911, DO NOT SEND A MESSAGE THROUGH MYCHART.     Coupons for medication:      If you have any trouble affording  your medications, check out www.goodrx.com for coupons and competitive prices in your area.  If you need further assistance, let us know so we can work with you and your insurance to make sure you get the best care.    Lukka Black FNP-BC  [facility]   [facility]: [phone]  [place]: [phone]

## 2024-03-21 ENCOUNTER — Ambulatory Visit: Payer: BLUE CROSS/BLUE SHIELD

## 2024-03-21 DIAGNOSIS — R41844 Frontal lobe and executive function deficit: Secondary | ICD-10-CM

## 2024-03-21 DIAGNOSIS — R41840 Attention and concentration deficit: Secondary | ICD-10-CM

## 2024-03-21 DIAGNOSIS — R413 Other amnesia: Secondary | ICD-10-CM

## 2024-03-21 DIAGNOSIS — R41841 Cognitive communication deficit: Secondary | ICD-10-CM

## 2024-03-21 NOTE — Progress Notes (Signed)
 [facility]  [address], [address]  [place], [place]  [address]  Phone:  [phone]  Fax:  [phone]    SPEECH THERAPY DAILY TREATMENT NOTE    PATIENT: Hailey [name] DOB: 03-17-1981   MR #: [id number]  AGE: 43 y.o.    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.3]  TREATMENT DIAGNOSIS: Executive function deficit R41.844 Memory Deficits R41.3 Attention Deficits R41.840 Cognitive-Communication Deficit R41.841     Date of Service SLP Received On: 03/21/24   Treatment Time Start Time: 1105 to Stop Time: 1200   Time Calculation Time Calculation (min): 55 min   Visit # SLP Visit Number: 11   Units Billed   SLP Treatments  $ SLP Treatment Indiv [phone]): 1 Procedure     Medications:  has a current medication list which includes the following prescription(s): otezla , atogepant , atomoxetine , botulinum toxin type a , buprenorphine, buspirone , clindamycin , colesevelam , dapsone, dicyclomine , divalproex  (er) extended release, dorzolamide-timolol, eletriptan , epinephrine , folic acid , hydroxyzine , infliximab , lacosamide , lidocaine -prilocaine, melatonin-pyridoxine, methocarbamol , methotrexate , multiple vitamin, omeprazole , ondansetron , prednisone , rimegepant sulfate , valacyclovir , cyanocobalamin , and vitamin d , and the following Facility-Administered Medications: botulinum toxin type a  and onabotulinumtoxin a.  Patient/Caregiver does not report changes to medication at this time.    Precautions:   Seizures - Most recent seizure: December 16, 2023                    Rescue Plan: No rescue plan, typically come out of it after 90 seconds                    At times a sense of deja vu and then unresponsive     Allergies:  Allergies[1]    SUBJECTIVE REPORT:  Patient presents for speech therapy  today alone. Pt reports she has only 1 more day of the pain patch and will meet with MD next week to determine next steps.    Pain:    4-5 on a scale of 1 - 10; location: SI joints      OBJECTIVE FINDINGS:  Interventions / Goal Status:  Goals copied from last visit:    Short Term Goals:  To be met by 6 weeks from 03/14/2024   Pt will utilize attentive reading and constrained summarization when reading short paragraphs (5-7 sentences) of material relevant to their interests and daily life (personal letters, news, magazine articles, etc.) and answer questions regarding material with 90% acc when provided with min cues across 2 sessions to improve reading comprehension.      Long Term Goals:  To be met by 12 weeks from 03/14/2024  Pt will report improved cognitive communication skills demonstrated by an increased score on patient reported outcome measure (PROMIS Item Bank v2.0 - Cognitive Function - Short Form 8a) from a baseline score of 16/40.  Pt will verbalize at least 2 processing strategies (i.e. requesting clarification, requesting repetition, paraphrasing information, etc.) and describe at least 2 situation where strategies were utilized during functional tasks across a 7 day period (e.g. medical appointments, phone calls, etc.), based on pt report.   Pt will verbalize at least 3 word finding strategies (e.g. extra time, script training, synonyms, etc.) and describe at least 2 situations where strategies were utilized during functional tasks (e.g.conversation with husband,  friends, medical professionals, etc.), based on pt report.         GOAL # INTERVENTIONS PROGRESS   STG 1 Reading strategies - Supportive environment, identify purpose of reading, attentive reading and constrained summarization, taking notes, etc. EMERGING:     Reading strategies introduced via handout and verbal education.         LTG 1  NOT ADDRESSED TODAY.   LTG 2 Processing strategies - Take notes, ask speaker to slow down, request  clarification/repetition, paraphrase/summarize information PROGRESSING:    Pt able to verbalize at least 2 processing strategies and identify at least 1 situation in which strategies were utilized.    LTG 3  NOT ADDRESSED TODAY.         Education/Home Exercise Program:    Patient was educated on strategies and techniques used in treatment session.    HEP: Reading Strategies    Response to Tx Today:  Reading strategies introduced via handout and verbal explanation. Pt receptive to use. Immersive reader on Word introduced as vision strategy for reading on the computer. Attentive reading and constrained summarization also introduced as additional reading strategy.     ASSESSMENT: Hailey [name] is a 43 y.o. female presents to outpatient therapy today with mild cognitive communication impairments, mild memory impairments, and mild executive function impairments. These deficits impact the pt's ability to complete functional tasks in the home such as cooking, organizing a list, reading complex material, and recalling medical information. Pt warrants skilled SLP intervention to address deficits and train cognitive linguistic compensatory strategies for increased independence in functional tasks.     The following impairments were noted:  Mild cognitive communication impairment, as characterized by deficits in word finding in conversation, phonemic fluency, comprehension of basic and complex written material, and problem solving.  Mild memory impairment, as characterized by impairments in delayed recall of new information.   Mild executive function and attention impairment, as characterized by impairments in sustained attention, alternating/divided attention, and planning/organizing.      Patient presents with the following functional limitations:  Patient has difficulty with communicating at the level of conversation.  Unable to effectively implement attention / memory strategies for sustained attention,  alternating/divided attention, and recall of new information.  Difficulty with executive function skills such as  problem solving and planning/organizing.     Without intervention, patient is at risk for:  Decreased social interactions that may lead to further cognitive decline and contribute to further medical complications and increased fall risk.  Inability to hold current job position and loss of income.  Burden of care.  Depression.    Patient is progressing well toward above mentioned functional goals.    PLAN:   It is medically necessary for Hailey [name] to recieve skilled speech therapy intervention in order to address the above impairments and functional limitations.    Recommend continue per plan of care to work toward increased cognitive and communication abilities for safety and independence with daily activities and in the community.       NEXT VISIT: Review processing, word finding, and reading strategies    Therapist Signature:    Hailey [name] M.A., CCC-SLP  Speech Language Pathologist  [facility]  Outpatient Specialty Rehabilitation  Hailey [name]    03/21/2024         [1]   Allergies  Allergen Reactions    Fruit Blend Flavor [Flavoring Agent (Non-Screening)] Anaphylaxis and Swelling     Tropical Fruit     Latex Drug-Induced Flushing,  Hives, Other (See Comments), Swelling, Itching and Rash    Cyanoacrylate Itching and Rash    Other Itching and Rash     Sutures Monocryl    Oxycodone Hallucinations, Hives and Other (See Comments)     Hallucinations, took 1 time 25 years ago    Wound Dressing Adhesive Itching and Other (See Comments)     adhesive

## 2024-03-24 ENCOUNTER — Other Ambulatory Visit: Payer: Self-pay | Admitting: Neurology

## 2024-03-24 DIAGNOSIS — R569 Unspecified convulsions: Secondary | ICD-10-CM

## 2024-03-25 ENCOUNTER — Encounter: Payer: Self-pay | Admitting: Internal Medicine

## 2024-03-25 MED ORDER — LACOSAMIDE 200 MG PO TABS
200.0000 mg | ORAL_TABLET | Freq: Two times a day (BID) | ORAL | 1 refills | Status: DC
Start: 2024-03-25 — End: 2024-09-09

## 2024-03-25 NOTE — Telephone Encounter (Signed)
 LOV 02/26/24  FU 05/27/24

## 2024-03-26 ENCOUNTER — Other Ambulatory Visit: Payer: Self-pay

## 2024-03-26 MED FILL — Rimegepant Sulfate Tab Disint 75 MG: ORAL | 30 days supply | Qty: 16 | Fill #2 | Status: AC

## 2024-03-26 MED FILL — Atogepant Tab 60 MG: ORAL | 30 days supply | Qty: 30 | Fill #2 | Status: AC

## 2024-03-26 NOTE — Telephone Encounter (Signed)
 Closing Encounter.

## 2024-03-27 ENCOUNTER — Other Ambulatory Visit: Payer: Self-pay

## 2024-03-28 ENCOUNTER — Ambulatory Visit: Payer: BLUE CROSS/BLUE SHIELD

## 2024-03-28 DIAGNOSIS — R413 Other amnesia: Secondary | ICD-10-CM

## 2024-03-28 DIAGNOSIS — R41841 Cognitive communication deficit: Secondary | ICD-10-CM

## 2024-03-28 DIAGNOSIS — R41844 Frontal lobe and executive function deficit: Secondary | ICD-10-CM

## 2024-03-28 DIAGNOSIS — R41840 Attention and concentration deficit: Secondary | ICD-10-CM

## 2024-03-28 NOTE — Progress Notes (Signed)
 HISTORY OF PRESENT ILLNESS:    Chief Complaint: Pain and Swelling of the Left Ankle and Pain of the Left Foot    No data recorded    Patient ID: Hailey [name] is a 43 y.o. female who presents with Pain and Swelling of the Left Ankle and Pain of the Left Foot    Patient was last seen in clinic on 02/29/2024 for   1. Left ankle swelling    2. Left ankle pain, unspecified chronicity      At that time, she presented evaluation of her left ankle swelling ongoing for over 1 year. She was referred to me by Dr. Zona to evaluate for an orthopedic cause to her swelling. I reviewed her MRI from 01/03/2024 which did not reveal any mass or fluid collection. I believed the swelling could be secondary to an enlarged fat pad, a lipoma, or secondary to nervous dysfunction. She has not obtained an US  to r/o a blood clot, and I referred her for a venous doppler.     Today, Hailey [name] presents for a follow up discussion. She underwent the US  venous doppler of her left LE which was negative for DVT. She has some pain still as her ankle that is essentially unchanged. It makes it difficult for her to wear shoes. She notes that she is on a lot of medications that have not helped with the swelling. She mostly has swelling over her lateral ankle but notes she also has some medially. Patient is ambulating in shoes and a cane. 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 Patient is immunocompromised.     VAS PAIN SCORE: 5     OBJECTIVE:    Constitutional: No acute distress. Well nourished. Well developed. Body mass index is 47.9 kg/m.  Eyes: Sclera are nonicteric.  Psychiatric: Alert and oriented x3.  Respiratory: No labored breathing.  Skin: No marked skin ulcers.  Lymphatic: No significant distal lymphangitis.    Cardiovascular: Reveals no varicosities or pitting edema of either lower extremity.      Left Ankle and Left Foot:  She has fullness over the lateral ankle over the sinus tarsi.   Skin is intact.   Fires EHL, FHL,  gastrocsoleus, anterior tibial, posterior tibial, and peroneal motor groups.   Palpable dorsalis pedis and posterior tibial pulses.   Normal capillary refill.      IMAGING / STUDIES:           Ultrasound leg left venous doppler  Result Date: 03/07/2024  [facility] ELAN, MCELVAIN PERFORMED AT: [id number]       DOB:[birth year]                 [address], [address] 03/07/2024     AGE:71   Gender:F              Physicians Only: [phone]      DAVID CHRISTELLA MELNICK MD                                  [F,H]      [address]      [place], [place] [address] ULTRASOUND LEFT LEG VENOUS DOPPLER, DVT PROTOCOL HISTORY: Left leg edema COMPARISON: CT 11/24/2022 TECHNIQUE: Elnor scale, color flow,  and spectral Doppler waveform analysis was performed on the veins described below. There is normal compressibility, phasic flow, and response to augmentation unless otherwise noted. FINDINGS: Left lower extremity: Common femoral vein: Normal Greater saphenous vein at the saphenofemoral junction: Normal Deep femoral vein (proximal portion): Normal Femoral vein: Normal Popliteal vein: Normal Posterior tibial veins: Normal Peroneal veins: Normal The right common femoral vein (imaged per protocol) is patent. IMPRESSION: No sonographic evidence for left lower extremity deep venous thrombosis. Electronically signed by: Dionicio Capri D.O. [facility]     X-ray foot left 2 views  Result Date: 02/29/2024  Shielding: Shielded. Weight Bearing. AP, Oblique.     Impression: 2 views of the left foot: No acute fractures or dislocations. The ankle mortise is well aligned. Bunionette deformity. Mild insertional calcaneal enthesophyte Os peroneum.    X-ray ankle left 3+ views  Result Date: 02/29/2024  Shielding: Shielded. Weight Bearing. AP, Mortise, Lat.     Impression: 3 views of the left ankle: No acute fractures or dislocations. The ankle  mortise is well aligned. Bunionette deformity. Mild insertional calcaneal enthesophyte Os peroneum.      ASSESSMENT:    (M25.472) Left ankle swelling  (primary encounter diagnosis)  (M25.572) Left ankle pain, unspecified chronicity    PLAN:    Hailey [name] underwent an US  venous doppler of her left LE on 03/07/2024 which was negative for DVT. We discussed how her MRI from 01/03/2024 was essentially normal. We discussed her differential diagnosis.  We discussed considering a surgical removal of the fat pad or lipoma at her lateral anterior ankle. We would send the tissue to pathology for testing.   We discussed the possibility that she has a bacterial or fungal infection. I believe this is unlikely given how long her symptoms have been ongoing with minimal progression. We discussed obtaining inflammatory markers to evaluate for infection however this may be skewed by her Hx of inflammatory issues.   We discussed seeing a lymphatic clinic. Referral provided.   Follow up after seeing lymphedema clinic.     The findings of today's encounter have been reviewed with the patient. Questions have been invited and answered to her satisfaction. She has an excellent understanding and agrees with the proposed plan.    Orders Placed This Encounter   . Ambulatory referral to lymphedema (physical therapy)   . BP Patient Education       I, Alm Melnick, MD, personally, performed the services described in this documentation, as scribed in my presence, and it is both accurate and complete.  Scribed by: Babs Matte

## 2024-03-28 NOTE — Progress Notes (Signed)
 [facility]  [address]  [place], [place]  [address]  Phone:  [phone]  Fax:  [phone]    SPEECH THERAPY DAILY TREATMENT NOTE    PATIENT: Hailey [name] DOB: 10-Oct-1981   MR #: [id number]  AGE: 43 y.o.    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];Memory loss [R41.3]  TREATMENT DIAGNOSIS: Executive function deficit R41.844 Memory Deficits R41.3 Attention Deficits R41.840 Cognitive-Communication Deficit R41.841     Date of Service SLP Received On: 03/28/24   Treatment Time Start Time: 1100 to Stop Time: 1200   Time Calculation Time Calculation (min): 60 min   Visit # SLP Visit Number: 12   Units Billed   SLP Treatments  $ SLP Treatment Indiv [phone]): 1 Procedure     Medications:  has a current medication list which includes the following prescription(s): otezla , atogepant , atomoxetine , botulinum toxin type a , buprenorphine, buspirone , clindamycin , colesevelam , dapsone, dicyclomine , divalproex  (er) extended release, dorzolamide-timolol, eletriptan , epinephrine , folic acid , hydroxyzine , infliximab , lacosamide , lidocaine -prilocaine, melatonin-pyridoxine, methocarbamol , methotrexate , multiple vitamin, omeprazole , ondansetron , prednisone , rimegepant sulfate , valacyclovir , cyanocobalamin , and vitamin d , and the following Facility-Administered Medications: botulinum toxin type a  and onabotulinumtoxin a.  Patient/Caregiver does report changes to medication at this time.    Precautions:   Seizures - Most recent seizure: December 16, 2023                    Rescue Plan: No rescue plan, typically come out of it after 90 seconds                    At times a sense of deja vu and then unresponsive     Allergies:  Allergies[1]    SUBJECTIVE REPORT:  Patient presents for speech therapy  today alone. Pt reports pain patch is now off and medications will be changed tonight.    Pain:    5-6 on a scale of 1 - 10; location: SI joints      OBJECTIVE FINDINGS:  Interventions / Goal Status:  Goals copied from last visit:    Short Term Goals:  To be met by 6 weeks from 03/14/2024   Pt will utilize attentive reading and constrained summarization when reading short paragraphs (5-7 sentences) of material relevant to their interests and daily life (personal letters, news, magazine articles, etc.) and answer questions regarding material with 90% acc when provided with min cues across 2 sessions to improve reading comprehension.      Long Term Goals:  To be met by 12 weeks from 03/14/2024  Pt will report improved cognitive communication skills demonstrated by an increased score on patient reported outcome measure (PROMIS Item Bank v2.0 - Cognitive Function - Short Form 8a) from a baseline score of 16/40.  Pt will verbalize at least 2 processing strategies (i.e. requesting clarification, requesting repetition, paraphrasing information, etc.) and describe at least 2 situation where strategies were utilized during functional tasks across a 7 day period (e.g. medical appointments, phone calls, etc.), based on pt report.   Pt will verbalize at least 3 word finding strategies (e.g. extra time, script training, synonyms, etc.) and describe at least 2 situations where strategies were utilized during functional tasks (e.g.conversation with husband, friends, medical professionals, etc.), based on pt report.  GOAL # INTERVENTIONS PROGRESS   STG 1 Reading strategies - Supportive environment, identify purpose of reading, attentive reading and constrained summarization, taking notes, etc. PROGRESSING:    Pt read 5 sentences of material and answered questions with 90% acc when provided with priming for strategy use.        LTG 1  NOT ADDRESSED TODAY.   LTG 2 Processing strategies - Take notes, ask speaker to slow down,  request clarification/repetition, paraphrase/summarize information PROGRESSING:    Pt able to verbalize at least 2 processing strategies and identify at least 1 situation in which strategies were utilized.    LTG 3  NOT ADDRESSED TODAY.         Education/Home Exercise Program:    Patient was educated on strategies and techniques used in treatment session.    HEP: Reading Strategies    Response to Tx Today:  Reading strategies reviewed and utilized during structured reading task.SLP introduced [employer] website to pt for reading material on current events.       ASSESSMENT: [name] is a 43 y.o. female presents to outpatient therapy today with mild cognitive communication impairments, mild memory impairments, and mild executive function impairments. These deficits impact the pt's ability to complete functional tasks in the home such as cooking, organizing a list, reading complex material, and recalling medical information. Pt warrants skilled SLP intervention to address deficits and train cognitive linguistic compensatory strategies for increased independence in functional tasks.     The following impairments were noted:  Mild cognitive communication impairment, as characterized by deficits in word finding in conversation, phonemic fluency, comprehension of basic and complex written material, and problem solving.  Mild memory impairment, as characterized by impairments in delayed recall of new information.   Mild executive function and attention impairment, as characterized by impairments in sustained attention, alternating/divided attention, and planning/organizing.      Patient presents with the following functional limitations:  Patient has difficulty with communicating at the level of conversation.  Unable to effectively implement attention / memory strategies for sustained attention, alternating/divided attention, and recall of new information.  Difficulty with executive function skills such  as  problem solving and planning/organizing.     Without intervention, patient is at risk for:  Decreased social interactions that may lead to further cognitive decline and contribute to further medical complications and increased fall risk.  Inability to hold current job position and loss of income.  Burden of care.  Depression.    Patient is progressing well toward above mentioned functional goals.    PLAN:   It is medically necessary for Wanda Rock Beck to recieve skilled speech therapy intervention in order to address the above impairments and functional limitations.    Recommend continue per plan of care to work toward increased cognitive and communication abilities for safety and independence with daily activities and in the community.       NEXT VISIT: Review processing, word finding, and reading strategies with [employer] article    Therapist Signature:    Bernarda Hench M.A., CCC-SLP  Speech Language Pathologist  [facility]  Outpatient Specialty Rehabilitation  [email or web address]    03/28/2024         [1]   Allergies  Allergen Reactions    Fruit Blend Flavor [Flavoring Agent (Non-Screening)] Anaphylaxis and Swelling     Tropical Fruit     Latex Drug-Induced Flushing, Hives, Other (See Comments), Swelling, Itching and Rash    Cyanoacrylate Itching and  Rash    Other Itching and Rash     Sutures Monocryl    Oxycodone Hallucinations, Hives and Other (See Comments)     Hallucinations, took 1 time 25 years ago    Wound Dressing Adhesive Itching and Other (See Comments)     adhesive

## 2024-03-29 ENCOUNTER — Ambulatory Visit
Admission: RE | Admit: 2024-03-29 | Discharge: 2024-03-29 | Disposition: A | Discharge status: Home / Self Care | Payer: BLUE CROSS/BLUE SHIELD | Source: Ambulatory Visit | Attending: Obstetrics and Gynecology | Admitting: Obstetrics and Gynecology

## 2024-03-29 DIAGNOSIS — Z1231 Encounter for screening mammogram for malignant neoplasm of breast: Secondary | ICD-10-CM | POA: Insufficient documentation

## 2024-04-02 ENCOUNTER — Ambulatory Visit: Payer: BLUE CROSS/BLUE SHIELD | Attending: Internal Medicine

## 2024-04-02 ENCOUNTER — Encounter (INDEPENDENT_AMBULATORY_CARE_PROVIDER_SITE_OTHER): Payer: Self-pay | Admitting: Family Medicine

## 2024-04-02 ENCOUNTER — Other Ambulatory Visit (INDEPENDENT_AMBULATORY_CARE_PROVIDER_SITE_OTHER): Payer: BLUE CROSS/BLUE SHIELD

## 2024-04-02 ENCOUNTER — Telehealth (INDEPENDENT_AMBULATORY_CARE_PROVIDER_SITE_OTHER): Payer: BLUE CROSS/BLUE SHIELD | Admitting: Family Medicine

## 2024-04-02 ENCOUNTER — Ambulatory Visit: Payer: Self-pay | Admitting: Obstetrics and Gynecology

## 2024-04-02 VITALS — BP 126/84 | HR 86 | Temp 97.5°F | Wt 313.5 lb

## 2024-04-02 DIAGNOSIS — I428 Other cardiomyopathies: Secondary | ICD-10-CM

## 2024-04-02 DIAGNOSIS — K120 Recurrent oral aphthae: Secondary | ICD-10-CM

## 2024-04-02 DIAGNOSIS — M0600 Rheumatoid arthritis without rheumatoid factor, unspecified site: Secondary | ICD-10-CM | POA: Insufficient documentation

## 2024-04-02 DIAGNOSIS — R569 Unspecified convulsions: Secondary | ICD-10-CM | POA: Insufficient documentation

## 2024-04-02 DIAGNOSIS — M352 Behcet's disease: Secondary | ICD-10-CM

## 2024-04-02 DIAGNOSIS — H209 Unspecified iridocyclitis: Secondary | ICD-10-CM | POA: Insufficient documentation

## 2024-04-02 DIAGNOSIS — M0579 Rheumatoid arthritis with rheumatoid factor of multiple sites without organ or systems involvement: Secondary | ICD-10-CM

## 2024-04-02 DIAGNOSIS — Z79899 Other long term (current) drug therapy: Secondary | ICD-10-CM

## 2024-04-02 DIAGNOSIS — R110 Nausea: Secondary | ICD-10-CM

## 2024-04-02 DIAGNOSIS — Z6841 Body Mass Index (BMI) 40.0 and over, adult: Secondary | ICD-10-CM

## 2024-04-02 LAB — LAB USE ONLY - CBC WITH DIFFERENTIAL
Absolute Basophils: 0.05 x10 3/uL (ref 0.00–0.08)
Absolute Eosinophils: 0.09 x10 3/uL (ref 0.00–0.44)
Absolute Immature Granulocytes: 0.1 x10 3/uL — ABNORMAL HIGH (ref 0.00–0.07)
Absolute Lymphocytes: 5.31 x10 3/uL — ABNORMAL HIGH (ref 0.42–3.22)
Absolute Monocytes: 0.91 x10 3/uL — ABNORMAL HIGH (ref 0.21–0.85)
Absolute Neutrophils: 6.87 x10 3/uL — ABNORMAL HIGH (ref 1.10–6.33)
Absolute nRBC: 0 x10 3/uL (ref <=0.00)
Basophils %: 0.4 %
Eosinophils %: 0.7 %
Hematocrit: 46 % — ABNORMAL HIGH (ref 34.7–43.7)
Hemoglobin: 15.5 g/dL — ABNORMAL HIGH (ref 11.4–14.8)
Immature Granulocytes %: 0.8 %
Lymphocytes %: 39.8 %
MCH: 33.5 pg (ref 25.1–33.5)
MCHC: 33.7 g/dL (ref 31.5–35.8)
MCV: 99.6 fL — ABNORMAL HIGH (ref 78.0–96.0)
MPV: 8.6 fL — ABNORMAL LOW (ref 8.9–12.5)
Monocytes %: 6.8 %
Neutrophils %: 51.5 %
Platelet Count: 315 x10 3/uL (ref 142–346)
Preliminary Absolute Neutrophil Count: 6.87 x10 3/uL — ABNORMAL HIGH (ref 1.10–6.33)
RBC: 4.62 x10 6/uL (ref 3.90–5.10)
RDW: 12 % (ref 11–15)
WBC: 13.33 x10 3/uL — ABNORMAL HIGH (ref 3.10–9.50)
nRBC %: 0 /100{WBCs} (ref <=0.0)

## 2024-04-02 LAB — COMPREHENSIVE METABOLIC PANEL
ALT: 55 U/L (ref <=55)
AST (SGOT): 37 U/L (ref <=41)
Albumin/Globulin Ratio: 1.1 (ref 0.9–2.2)
Albumin: 3.9 g/dL (ref 3.5–5.0)
Alkaline Phosphatase: 46 U/L (ref 37–117)
Anion Gap: 12 (ref 5.0–15.0)
BUN: 12 mg/dL (ref 7–21)
Bilirubin, Total: 0.5 mg/dL (ref 0.2–1.2)
CO2: 25 meq/L (ref 17–29)
Calcium: 9.8 mg/dL (ref 8.5–10.5)
Chloride: 104 meq/L (ref 99–111)
Creatinine: 0.8 mg/dL (ref 0.4–1.0)
GFR: >60 mL/min/1.73 m2 (ref >=60.0)
Globulin: 3.7 g/dL — ABNORMAL HIGH (ref 2.0–3.6)
Glucose: 83 mg/dL (ref 70–100)
Hemolysis Index: 7 {index}
Potassium: 4 meq/L (ref 3.5–5.3)
Protein, Total: 7.6 g/dL (ref 6.0–8.3)
Sodium: 141 meq/L (ref 135–145)

## 2024-04-02 LAB — C-REACTIVE PROTEIN: C-Reactive Protein: <0.1 mg/dL (ref 0.0–1.1)

## 2024-04-02 LAB — SEDIMENTATION RATE: Sed Rate: 8 mm/h (ref <=20)

## 2024-04-02 MED ORDER — ONDANSETRON 8 MG PO TBDP
8.0000 mg | ORAL_TABLET | Freq: Three times a day (TID) | ORAL | 3 refills | Status: DC | PRN
Start: 2024-04-02 — End: 2024-06-11

## 2024-04-02 MED ORDER — DIPHENHYDRAMINE HCL 50 MG/ML IJ SOLN
25.0000 mg | Freq: Once | INTRAMUSCULAR | Status: AC
Start: 2024-04-02 — End: 2024-04-02
  Administered 2024-04-02: 25 mg via INTRAVENOUS
  Filled 2024-04-02: qty 1

## 2024-04-02 MED ORDER — SODIUM CHLORIDE 0.9 % IV SOLN
700.0000 mg | Freq: Once | INTRAVENOUS | Status: AC
Start: 2024-04-02 — End: 2024-04-02
  Administered 2024-04-02: 700 mg via INTRAVENOUS
  Filled 2024-04-02: qty 700

## 2024-04-02 MED ORDER — ACETAMINOPHEN 325 MG PO TABS
650.0000 mg | ORAL_TABLET | Freq: Once | ORAL | Status: AC
Start: 2024-04-02 — End: 2024-04-02
  Administered 2024-04-02: 650 mg via ORAL
  Filled 2024-04-02: qty 2

## 2024-04-02 MED ORDER — METHYLPREDNISOLONE SODIUM SUCC 40 MG IJ SOLR (WRAP)
40.0000 mg | Freq: Once | INTRAMUSCULAR | Status: AC
Start: 2024-04-02 — End: 2024-04-02
  Administered 2024-04-02: 40 mg via INTRAVENOUS
  Filled 2024-04-02: qty 1

## 2024-04-02 NOTE — Assessment & Plan Note (Signed)
 Managed by cardiology/EP

## 2024-04-02 NOTE — Progress Notes (Signed)
 [facility]     Telehealth:  The Patient has given verbal consent for delivery of health care via telehealth.   The patient is located at Home in [place]   The encounter provider is located at their Medical Office in [place]   Epic Video Client was utilized for Real Time/Synchronous Telehealth.   The time spent in medical discussion during this visit was 15 minutes.        Chief Complaint   Patient presents with    Nausea     Subjective     History of Present Illness  Hailey [name] is a 43 year old female who presents with significant morning nausea and vomiting.    She experiences significant nausea every morning, often leading to gagging and vomiting. She takes Zofran  (ondansetron ) 4 mg every morning, which she finds effective after sitting in a recliner for about thirty minutes before taking her normal morning pills. She is concerned about the safety of taking Zofran  daily.    The nausea and vomiting have been present for the last couple of days. She has tried brushing her teeth before taking medication and drinking cold or room temperature water , but these have not been effective. She cannot eat anything until she takes her Welchol , as her system does not tolerate food well until then.    Review of Systems   Constitutional:  Negative for chills and fever.   Respiratory:  Negative for cough, shortness of breath and wheezing.    Cardiovascular:  Negative for chest pain and leg swelling.   Gastrointestinal:  Negative for abdominal pain, diarrhea, nausea and vomiting.   Neurological:  Negative for weakness, light-headedness, numbness and headaches.       Objective   LMP  (LMP Unknown)   Physical Exam  Nursing note reviewed.   Constitutional:       Appearance: Normal appearance.   HENT:      Mouth/Throat:      Mouth: Mucous membranes are moist.   Eyes:      Extraocular Movements: Extraocular movements intact.   Pulmonary:      Effort: Pulmonary effort is normal.   Neurological:      Mental  Status: She is alert.   Psychiatric:         Mood and Affect: Mood normal.       Physical Exam       Results        Assessment/Plan     Assessment & Plan  Behcet's disease (CMS/HCC)  Nausea  Suspect nausea is related to polypharmacy due to her chronic health conditions.   Ok to take daily zofran , will increase dose to help with morning nausea symptoms  Reviewed ECG from last year, has been normal, recommend annual ECG, which she will have done through EP  Discussed medications that could potentially cause her symptoms such as hydroxyzine  and her antiepileptic medications, which she takes regularly and at night. Could consider reducing dose of hydroxyzine  or stopping.  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

## 2024-04-02 NOTE — Progress Notes (Signed)
 [facility] - Adult Infusion   Visit Date: 04/02/2024      Hailey [name] is a 43 y.o. female patient    Patient presents to the infusion center for treatment 10 of Infliximab  infused @ 131ml/hr over 2 hours.       Pre-Medications: acetaminophen  (Tylenol ) and diphenhydramine  (Benadryl ) and solu-medrol     Line Type: Peripheral IV:  Left arm  Blood Return verified prior to administration: Yes    Vital Signs:  Patient Vitals for the past 12 hrs:   BP Temp Pulse   04/02/24 1326 126/84 -- 86   04/02/24 1013 (!) 133/95 -- 75   04/02/24 1005 (!) 148/102 97.5 F (36.4 C) 87      Body surface area is 2.61 meters squared.      Administrations This Visit       acetaminophen  (TYLENOL ) tablet 650 mg       Admin Date  04/02/2024  10:26 Action  Given Dose  650 mg Route  Oral Ordering Provider  Woodard Geni HERO, MD              diphenhydrAMINE  (BENADRYL ) injection 25 mg       Admin Date  04/02/2024  10:26 Action  Given Dose  25 mg Route  Intravenous Ordering Provider  Woodard Geni HERO, MD              inFLIXimab -axxq (AVSOLA ) 700 mg in sodium chloride  0.9 % 250 mL infusion       Admin Date  04/02/2024  11:15 Action  New Bag Dose  700 mg Route  Intravenous Ordering Provider  Woodard Geni HERO, MD              methylPREDNISolone  sodium succinate (Solu-MEDROL ) injection 40 mg       Admin Date  04/02/2024  10:26 Action  Given Dose  40 mg Route  Intravenous Ordering Provider  Woodard Geni HERO, MD                      Hypersensitivity Reaction Noted: No  IV Post Hydration: No, not required  Treatment Outcome:Patient tolerated treatment well. Peripheral Line flushed and deacessed.    Education: Patient/Family educated regarding the expected outcomes/side effects possible interactions of treatments and medications. Patient verbalizes understanding.    Follow-up Plan: Discharged in stable condition. Accompanied by, significant other. Instructed to contact physician if any complications or unmanageable  symptoms occur.    A return to the Infusion Center for the next treatment, has been scheduled.  RTC: 05/28/2024    Lillette Leos, RN    04/02/2024  1:40 PM

## 2024-04-02 NOTE — Assessment & Plan Note (Signed)
 Suspect nausea is related to polypharmacy due to her chronic health conditions.   Ok to take daily zofran , will increase dose to help with morning nausea symptoms  Reviewed ECG from last year, has been normal, recommend annual ECG, which she will have done through EP  Discussed medications that could potentially cause her symptoms such as hydroxyzine  and her antiepileptic medications, which she takes regularly and at night. Could consider reducing dose of hydroxyzine  or stopping.  Orders:    ondansetron  (ZOFRAN -ODT) 8 MG disintegrating tablet; Dissolve 1 tablet (8 mg) in the mouth every 8 (eight) hours as needed for Nausea

## 2024-04-03 LAB — VALPROIC ACID LEVEL, TOTAL AND FREE
Valproic Acid Free: 7.9 mg/L (ref 4.8–17.3)
Valproic Acid Level: 82.9 mg/L (ref 50.0–100.0)

## 2024-04-04 ENCOUNTER — Ambulatory Visit: Payer: BLUE CROSS/BLUE SHIELD

## 2024-04-04 DIAGNOSIS — R41844 Frontal lobe and executive function deficit: Secondary | ICD-10-CM

## 2024-04-04 DIAGNOSIS — R41840 Attention and concentration deficit: Secondary | ICD-10-CM

## 2024-04-04 DIAGNOSIS — R41841 Cognitive communication deficit: Secondary | ICD-10-CM

## 2024-04-04 DIAGNOSIS — R413 Other amnesia: Secondary | ICD-10-CM

## 2024-04-04 NOTE — Progress Notes (Signed)
 [facility]  [address]  [place], [place]  [address]  Phone:  [phone]  Fax:  [phone]    SPEECH THERAPY DAILY TREATMENT NOTE    PATIENT: Parminder Trapani DOB: 12-Jun-1981   MR #: [id number]  AGE: 43 y.o.    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];Memory loss [R41.3]  TREATMENT DIAGNOSIS: Executive function deficit R41.844 Memory Deficits R41.3 Attention Deficits R41.840 Cognitive-Communication Deficit R41.841     Date of Service SLP Received On: 04/04/24   Treatment Time Start Time: 1100 to Stop Time: 1200   Time Calculation Time Calculation (min): 60 min   Visit # SLP Visit Number: 13   Units Billed   SLP Treatments  $ SLP Treatment Indiv [phone]): 1 Procedure     Medications:  has a current medication list which includes the following prescription(s): otezla , atogepant , atomoxetine , botulinum toxin type a , buspirone , clindamycin , colesevelam , dapsone, dicyclomine , divalproex  (er) extended release, dorzolamide-timolol, eletriptan , epinephrine , folic acid , hydroxyzine , infliximab , lacosamide , lidocaine -prilocaine, melatonin-pyridoxine, methocarbamol , methotrexate , multiple vitamin, omeprazole , ondansetron , prednisone , rimegepant sulfate , valacyclovir , cyanocobalamin , and vitamin d , and the following Facility-Administered Medications: botulinum toxin type a  and onabotulinumtoxin a.  Patient/Caregiver does not report changes to medication at this time.    Precautions:   Seizures - Most recent seizure: December 16, 2023                    Rescue Plan: No rescue plan, typically come out of it after 90 seconds                    At times a sense of deja vu and then unresponsive     Allergies:  Allergies[1]    SUBJECTIVE REPORT:  Patient presents for speech therapy today alone. Pt  reports the last week has been busy with appointments and infusion on Tuesday. She reports yesterday evening was difficult energy wise, and she was in the red zoen by evening. Pt reports troubles swallowing pills, with pills feeling stuck in throat, pointing near sternal notch.     Pain:    5-6 on a scale of 1 - 10; location: SI joints      OBJECTIVE FINDINGS:  Interventions / Goal Status:  Goals copied from last visit:    Short Term Goals:  To be met by 6 weeks from 03/14/2024   Pt will utilize attentive reading and constrained summarization when reading short paragraphs (5-7 sentences) of material relevant to their interests and daily life (personal letters, news, magazine articles, etc.) and answer questions regarding material with 90% acc when provided with min cues across 2 sessions to improve reading comprehension.      Long Term Goals:  To be met by 12 weeks from 03/14/2024  Pt will report improved cognitive communication skills demonstrated by an increased score on patient reported outcome measure (PROMIS Item Bank v2.0 - Cognitive Function - Short Form 8a) from a baseline score of 16/40.  Pt will verbalize at least 2 processing strategies (i.e. requesting clarification, requesting repetition, paraphrasing information, etc.) and describe at least 2 situation where strategies were utilized during functional tasks across a 7 day period (e.g. medical appointments, phone calls, etc.), based on pt report.   Pt will verbalize at least 3 word finding strategies (  e.g. extra time, script training, synonyms, etc.) and describe at least 2 situations where strategies were utilized during functional tasks (e.g.conversation with husband, friends, medical professionals, etc.), based on pt report.         GOAL # INTERVENTIONS PROGRESS   STG 1 Reading strategies - Supportive environment, identify purpose of reading, attentive reading and constrained summarization, taking notes, etc. PROGRESSING:    Pt read 5 sentences of  material and answered questions with 90% acc when provided with priming for strategy use.        LTG 1  NOT ADDRESSED TODAY.   LTG 2 Processing strategies - Take notes, ask speaker to slow down, request clarification/repetition, paraphrase/summarize information PROGRESSING:    Pt able to verbalize  2 processing strategies and identified 1 situation in which strategies were utilized.    LTG 3  PROGRESSING:    Pt able to verbalize 3 word finding strategies and identified 1 situation in which strategies were utilized.      Education/Home Exercise Program:    Patient was educated on strategies and techniques used in treatment session.    HEP: Reading Strategies    Response to Tx Today:  Pacing strategies reviewed; Pt to personalize orange zone on Cognitive Pacing Scale to assist with recognizing symptoms that occur when a break or pacing is warranted. Dysphagia compensatory strategies discussed. SLP explained possible etiologies of dysphagia including medication induced, which is possible given seizure medications, zofran , and muscle relaxer's which can contribute to dysphagia, reflux, and neurological etiologies given neurological Behcet's Disease.      ASSESSMENT: Ranessa Kosta is a 43 y.o. female presents to outpatient therapy today with mild cognitive communication impairments, mild memory impairments, and mild executive function impairments. These deficits impact the pt's ability to complete functional tasks in the home such as cooking, organizing a list, reading complex material, and recalling medical information. Pt warrants skilled SLP intervention to address deficits and train cognitive linguistic compensatory strategies for increased independence in functional tasks.     The following impairments were noted:  Mild cognitive communication impairment, as characterized by deficits in word finding in conversation, phonemic fluency, comprehension of basic and complex written material, and problem  solving.  Mild memory impairment, as characterized by impairments in delayed recall of new information.   Mild executive function and attention impairment, as characterized by impairments in sustained attention, alternating/divided attention, and planning/organizing.      Patient presents with the following functional limitations:  Patient has difficulty with communicating at the level of conversation.  Unable to effectively implement attention / memory strategies for sustained attention, alternating/divided attention, and recall of new information.  Difficulty with executive function skills such as  problem solving and planning/organizing.     Without intervention, patient is at risk for:  Decreased social interactions that may lead to further cognitive decline and contribute to further medical complications and increased fall risk.  Inability to hold current job position and loss of income.  Burden of care.  Depression.    Patient is progressing well toward above mentioned functional goals.    PLAN:   It is medically necessary for Wanda Rock Beck to recieve skilled speech therapy intervention in order to address the above impairments and functional limitations.    Recommend continue per plan of care to work toward increased cognitive and communication abilities for safety and independence with daily activities and in the community.       NEXT VISIT: Review processing, word finding, and reading  strategies with [employer] article    Therapist Signature:    Bernarda Hench M.A., CCC-SLP  Speech Language Pathologist  [facility]  Outpatient Specialty Rehabilitation  [email or web address]    04/04/2024         [1]   Allergies  Allergen Reactions    Fruit Blend Flavor [Flavoring Agent (Non-Screening)] Anaphylaxis and Swelling     Tropical Fruit     Latex Drug-Induced Flushing, Hives, Other (See Comments), Swelling, Itching and Rash    Cyanoacrylate Itching and Rash    Other Itching and Rash      Sutures Monocryl    Oxycodone Hallucinations, Hives and Other (See Comments)     Hallucinations, took 1 time 25 years ago    Wound Dressing Adhesive Itching and Other (See Comments)     adhesive

## 2024-04-06 LAB — INFLIXIMAB LEVEL AND ANTI-DRUG ANTIBODY FOR IBD
Infliximab Antibody: 12 [AU] — ABNORMAL HIGH (ref <10)
Infliximab Level: 6.2 ug/mL

## 2024-04-09 ENCOUNTER — Telehealth (INDEPENDENT_AMBULATORY_CARE_PROVIDER_SITE_OTHER): Payer: BLUE CROSS/BLUE SHIELD | Admitting: Family Medicine

## 2024-04-10 ENCOUNTER — Ambulatory Visit: Payer: BLUE CROSS/BLUE SHIELD | Attending: Neurology

## 2024-04-10 DIAGNOSIS — R41844 Frontal lobe and executive function deficit: Secondary | ICD-10-CM | POA: Insufficient documentation

## 2024-04-10 DIAGNOSIS — R413 Other amnesia: Secondary | ICD-10-CM | POA: Insufficient documentation

## 2024-04-10 DIAGNOSIS — R41840 Attention and concentration deficit: Secondary | ICD-10-CM | POA: Insufficient documentation

## 2024-04-10 DIAGNOSIS — M352 Behcet's disease: Secondary | ICD-10-CM | POA: Insufficient documentation

## 2024-04-10 DIAGNOSIS — R41841 Cognitive communication deficit: Secondary | ICD-10-CM | POA: Insufficient documentation

## 2024-04-10 NOTE — Progress Notes (Signed)
 [facility]  [address]  [place], [place]  [address]  Phone:  [phone]  Fax:  [phone]    SPEECH THERAPY DAILY TREATMENT NOTE    PATIENT: Hailey [name] DOB: 1981-06-12   MR #: [id number]  AGE: 43 y.o.    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];Memory loss [R41.3]  TREATMENT DIAGNOSIS: Executive function deficit R41.844 Memory Deficits R41.3 Attention Deficits R41.840 Cognitive-Communication Deficit R41.841     Date of Service SLP Received On: 04/10/24   Treatment Time Start Time: 0900 to Stop Time: 0930   Time Calculation Time Calculation (min): 30 min   Visit # SLP Visit Number: 14   Units Billed   SLP Treatments  $ SLP Treatment Indiv [phone]): 1 Procedure     Medications:  has a current medication list which includes the following prescription(s): otezla , atogepant , atomoxetine , botulinum toxin type a , buspirone , clindamycin , colesevelam , dapsone, dicyclomine , divalproex  (er) extended release, dorzolamide-timolol, eletriptan , epinephrine , folic acid , hydroxyzine , infliximab , lacosamide , lidocaine -prilocaine, melatonin-pyridoxine, methocarbamol , methotrexate , multiple vitamin, omeprazole , ondansetron , prednisone , rimegepant sulfate , valacyclovir , cyanocobalamin , and vitamin d , and the following Facility-Administered Medications: botulinum toxin type a  and onabotulinumtoxin a.  Patient/Caregiver does not report changes to medication at this time.    Precautions:   Seizures - Most recent seizure: December 16, 2023                    Rescue Plan: No rescue plan, typically come out of it after 90 seconds                    At times a sense of deja vu and then unresponsive     Allergies:  Allergies[1]    SUBJECTIVE REPORT:  Patient presents for speech therapy today alone. Pt  reports she is very tired.    Pain:    5-6 on a scale of 1 - 10; location: SI joints      OBJECTIVE FINDINGS:  Interventions / Goal Status:  Goals copied from last visit:    Short Term Goals:  To be met by 6 weeks from 03/14/2024   Pt will utilize attentive reading and constrained summarization when reading short paragraphs (5-7 sentences) of material relevant to their interests and daily life (personal letters, news, magazine articles, etc.) and answer questions regarding material with 90% acc when provided with min cues across 2 sessions to improve reading comprehension.      Long Term Goals:  To be met by 12 weeks from 03/14/2024  Pt will report improved cognitive communication skills demonstrated by an increased score on patient reported outcome measure (PROMIS Item Bank v2.0 - Cognitive Function - Short Form 8a) from a baseline score of 16/40.  Pt will verbalize at least 2 processing strategies (i.e. requesting clarification, requesting repetition, paraphrasing information, etc.) and describe at least 2 situation where strategies were utilized during functional tasks across a 7 day period (e.g. medical appointments, phone calls, etc.), based on pt report.   Pt will verbalize at least 3 word finding strategies (e.g. extra time, script training, synonyms, etc.) and describe at least 2 situations where strategies were utilized during functional tasks (e.g.conversation with husband, friends, medical professionals, etc.), based on pt report.         GOAL #  INTERVENTIONS PROGRESS   STG 1 Reading strategies - Supportive environment, identify purpose of reading, attentive reading and constrained summarization, taking notes, etc. PROGRESSING:    Pt read 5 sentences of material and answered questions with 90% acc when provided with priming for strategy use.        LTG 1  NOT ADDRESSED TODAY.   LTG 2 Processing strategies - Take notes, ask speaker to slow down, request clarification/repetition, paraphrase/summarize  information PROGRESSING:    Pt able to verbalize  2 processing strategies and identified 1 situation in which strategies were utilized.    LTG 3  PROGRESSING:    Pt able to verbalize 3 word finding strategies and identified 1 situation in which strategies were utilized.      Education/Home Exercise Program:    Patient was educated on strategies and techniques used in treatment session.    HEP: Reading Strategies    Response to Tx Today:  Dysphagia compensatory strategies reviewed; pt taking medications whole with applesauce which has been successful at relieving dysphagia symptoms with pills. SLP reviewed possible etiologies of dysphagia including medication induced, which is possible given seizure medications, zofran , and muscle relaxer's which can contribute to dysphagia, reflux, and neurological etiologies given neurological Behcet's Disease. Session ended at 9:30 due to pt nausea.      ASSESSMENT: Magdeline Prange is a 43 y.o. female presents to outpatient therapy today with mild cognitive communication impairments, mild memory impairments, and mild executive function impairments. These deficits impact the pt's ability to complete functional tasks in the home such as cooking, organizing a list, reading complex material, and recalling medical information. Pt warrants skilled SLP intervention to address deficits and train cognitive linguistic compensatory strategies for increased independence in functional tasks.     The following impairments were noted:  Mild cognitive communication impairment, as characterized by deficits in word finding in conversation, phonemic fluency, comprehension of basic and complex written material, and problem solving.  Mild memory impairment, as characterized by impairments in delayed recall of new information.   Mild executive function and attention impairment, as characterized by impairments in sustained attention, alternating/divided attention, and planning/organizing.       Patient presents with the following functional limitations:  Patient has difficulty with communicating at the level of conversation.  Unable to effectively implement attention / memory strategies for sustained attention, alternating/divided attention, and recall of new information.  Difficulty with executive function skills such as  problem solving and planning/organizing.     Without intervention, patient is at risk for:  Decreased social interactions that may lead to further cognitive decline and contribute to further medical complications and increased fall risk.  Inability to hold current job position and loss of income.  Burden of care.  Depression.    Patient is progressing well toward above mentioned functional goals.    PLAN:   It is medically necessary for Wanda Rock Beck to recieve skilled speech therapy intervention in order to address the above impairments and functional limitations.    Recommend continue per plan of care to work toward increased cognitive and communication abilities for safety and independence with daily activities and in the community.       NEXT VISIT: Review processing, word finding, and reading strategies with [employer] article    Therapist Signature:    Bernarda Hench M.A., CCC-SLP  Speech Language Pathologist  [facility]  Outpatient Specialty Rehabilitation  [email or web address]    04/10/2024         [  1]   Allergies  Allergen Reactions    Fruit Blend Flavor [Flavoring Agent (Non-Screening)] Anaphylaxis and Swelling     Tropical Fruit     Latex Drug-Induced Flushing, Hives, Other (See Comments), Swelling, Itching and Rash    Cyanoacrylate Itching and Rash    Other Itching and Rash     Sutures Monocryl    Oxycodone Hallucinations, Hives and Other (See Comments)     Hallucinations, took 1 time 25 years ago    Wound Dressing Adhesive Itching and Other (See Comments)     adhesive

## 2024-04-11 ENCOUNTER — Ambulatory Visit: Payer: BLUE CROSS/BLUE SHIELD

## 2024-04-18 ENCOUNTER — Ambulatory Visit: Payer: BLUE CROSS/BLUE SHIELD

## 2024-04-22 ENCOUNTER — Encounter: Payer: Self-pay | Admitting: Internal Medicine

## 2024-04-22 DIAGNOSIS — R569 Unspecified convulsions: Secondary | ICD-10-CM

## 2024-04-22 DIAGNOSIS — H209 Unspecified iridocyclitis: Secondary | ICD-10-CM

## 2024-04-22 DIAGNOSIS — Z79899 Other long term (current) drug therapy: Secondary | ICD-10-CM

## 2024-04-22 DIAGNOSIS — M0600 Rheumatoid arthritis without rheumatoid factor, unspecified site: Secondary | ICD-10-CM

## 2024-04-22 DIAGNOSIS — M352 Behcet's disease: Secondary | ICD-10-CM

## 2024-04-23 ENCOUNTER — Other Ambulatory Visit: Payer: Self-pay

## 2024-04-23 DIAGNOSIS — G43711 Chronic migraine without aura, intractable, with status migrainosus: Secondary | ICD-10-CM

## 2024-04-23 MED ORDER — ATOGEPANT 60 MG PO TABS
ORAL_TABLET | ORAL | 11 refills | Status: DC
Start: 2024-04-23 — End: 2024-06-11
  Filled 2024-04-25: qty 30, fill #0
  Filled 2024-04-30: qty 30, 30d supply, fill #0
  Filled 2024-05-29: qty 30, 30d supply, fill #1

## 2024-04-25 ENCOUNTER — Other Ambulatory Visit: Payer: Self-pay

## 2024-04-25 ENCOUNTER — Ambulatory Visit: Payer: BLUE CROSS/BLUE SHIELD

## 2024-04-25 DIAGNOSIS — R41841 Cognitive communication deficit: Secondary | ICD-10-CM

## 2024-04-25 DIAGNOSIS — R413 Other amnesia: Secondary | ICD-10-CM

## 2024-04-25 DIAGNOSIS — R41844 Frontal lobe and executive function deficit: Secondary | ICD-10-CM

## 2024-04-25 DIAGNOSIS — R41840 Attention and concentration deficit: Secondary | ICD-10-CM

## 2024-04-25 NOTE — Progress Notes (Signed)
 [facility]  [address]  [place], [place]  [address]  Phone:  [phone]  Fax:  [phone]    SPEECH THERAPY DAILY TREATMENT NOTE    PATIENT: Hailey [name] DOB: 06-16-1981   MR #: [id number]  AGE: 43 y.o.    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.3]  TREATMENT DIAGNOSIS: Executive function deficit R41.844 Memory Deficits R41.3 Attention Deficits R41.840 Cognitive-Communication Deficit R41.841     Date of Service SLP Received On: 04/25/24   Treatment Time Start Time: 1100 to Stop Time: 1200   Time Calculation Time Calculation (min): 60 min   Visit # SLP Visit Number: 15   Units Billed   SLP Treatments  $ SLP Treatment Indiv [phone]): 1 Procedure     Medications:  has a current medication list which includes the following prescription(s): otezla , atogepant , atomoxetine , botulinum toxin type a , buspirone , clindamycin , colesevelam , dapsone, dicyclomine , divalproex  (er) extended release, dorzolamide-timolol, eletriptan , epinephrine , folic acid , hydroxyzine , infliximab , lacosamide , lidocaine -prilocaine, melatonin-pyridoxine, methocarbamol , methotrexate , multiple vitamin, omeprazole , ondansetron , prednisone , rimegepant sulfate , valacyclovir , cyanocobalamin , and vitamin d , and the following Facility-Administered Medications: botulinum toxin type a  and onabotulinumtoxin a.  Patient/Caregiver does not report changes to medication at this time.    Precautions:   Seizures - Most recent seizure: December 16, 2023                    Rescue Plan: No rescue plan, typically come out of it after 90 seconds                    At times a sense of deja vu and then unresponsive     Allergies:  Allergies[1]    SUBJECTIVE REPORT:  Patient presents for speech therapy today alone. Pt  reports nausea over the last two weeks that has increased in intensity. She also reports pain near the base of skull. She is scheduled to see GI on July 16 and neurology at the end of July. SLP recommends seeing neurology sooner is possible.    Pain:    4 on a scale of 1 - 10; location: SI joints      OBJECTIVE FINDINGS:  Interventions / Goal Status:  Goals copied from last visit:    Short Term Goals:  To be met by 6 weeks from 03/14/2024   Pt will utilize attentive reading and constrained summarization when reading short paragraphs (5-7 sentences) of material relevant to their interests and daily life (personal letters, news, magazine articles, etc.) and answer questions regarding material with 90% acc when provided with min cues across 2 sessions to improve reading comprehension.      Long Term Goals:  To be met by 12 weeks from 03/14/2024  Pt will report improved cognitive communication skills demonstrated by an increased score on patient reported outcome measure (PROMIS Item Bank v2.0 - Cognitive Function - Short Form 8a) from a baseline score of 16/40.  Pt will verbalize at least 2 processing strategies (i.e. requesting clarification, requesting repetition, paraphrasing information, etc.) and describe at least 2 situation where strategies were utilized during functional tasks across a 7 day period (e.g. medical appointments, phone calls, etc.), based on pt report.   Pt will verbalize at least 3 word finding strategies (e.g. extra  time, script training, synonyms, etc.) and describe at least 2 situations where strategies were utilized during functional tasks (e.g.conversation with husband, friends, medical professionals, etc.), based on pt report.         GOAL # INTERVENTIONS PROGRESS   STG 1 Reading strategies - Supportive environment, identify purpose of reading, attentive reading and constrained summarization, taking notes, etc. PROGRESSING:    Pt read 5 sentences of material and answered questions with 90% acc  when provided with priming for strategy use.        LTG 1  NOT ADDRESSED TODAY.   LTG 2 Processing strategies - Take notes, ask speaker to slow down, request clarification/repetition, paraphrase/summarize information PROGRESSING:    Pt able to verbalize  2 processing strategies and identified 2 situations in which strategies were utilized.    LTG 3  PROGRESSING:    Pt able to verbalize 3 word finding strategies and identified 1 situation in which strategies were utilized.      Education/Home Exercise Program:    Patient was educated on strategies and techniques used in treatment session.    HEP: Reading Strategies    Response to Tx Today:  Pacing strategies discussed to assist with physical energy levels which impact cognitive and emotional.       ASSESSMENT: Hailey [name] is a 43 y.o. female presents to outpatient therapy today with mild cognitive communication impairments, mild memory impairments, and mild executive function impairments. These deficits impact the pt's ability to complete functional tasks in the home such as cooking, organizing a list, reading complex material, and recalling medical information. Pt warrants skilled SLP intervention to address deficits and train cognitive linguistic compensatory strategies for increased independence in functional tasks.     The following impairments were noted:  Mild cognitive communication impairment, as characterized by deficits in word finding in conversation, phonemic fluency, comprehension of basic and complex written material, and problem solving.  Mild memory impairment, as characterized by impairments in delayed recall of new information.   Mild executive function and attention impairment, as characterized by impairments in sustained attention, alternating/divided attention, and planning/organizing.      Patient presents with the following functional limitations:  Patient has difficulty with communicating at the level of conversation.  Unable to  effectively implement attention / memory strategies for sustained attention, alternating/divided attention, and recall of new information.  Difficulty with executive function skills such as  problem solving and planning/organizing.     Without intervention, patient is at risk for:  Decreased social interactions that may lead to further cognitive decline and contribute to further medical complications and increased fall risk.  Inability to hold current job position and loss of income.  Burden of care.  Depression.    Patient is progressing well toward above mentioned functional goals.    PLAN:   It is medically necessary for Hailey [name] to recieve skilled speech therapy intervention in order to address the above impairments and functional limitations.    Recommend continue per plan of care to work toward increased cognitive and communication abilities for safety and independence with daily activities and in the community.       NEXT VISIT: Review processing, word finding, and reading strategies with [employer] article    Therapist Signature:    Bernarda Hench M.A., CCC-SLP  Speech Language Pathologist  [facility]  Outpatient Specialty Rehabilitation  [name]    04/25/2024         [1]   Allergies  Allergen Reactions    Fruit Blend Flavor [Flavoring Agent (Non-Screening)] Anaphylaxis and Swelling     Tropical Fruit     Latex Drug-Induced Flushing, Hives, Other (See Comments), Swelling, Itching and Rash    Cyanoacrylate Itching and Rash    Other Itching and Rash     Sutures Monocryl    Oxycodone Hallucinations, Hives and Other (See Comments)     Hallucinations, took 1 time 25 years ago    Wound Dressing Adhesive Itching and Other (See Comments)     adhesive

## 2024-04-25 NOTE — Progress Notes (Signed)
 The prescription has been received by the Pharmacy Care Team. A benefit investigation is currently in process.    To follow up on the outcome of the benefit investigation, please check Episodes, Referrals or specialty pharmacy encounter section in the Patient chart review

## 2024-04-30 ENCOUNTER — Other Ambulatory Visit: Payer: Self-pay

## 2024-04-30 MED FILL — Rimegepant Sulfate Tab Disint 75 MG: ORAL | 30 days supply | Qty: 16 | Fill #3 | Status: AC

## 2024-04-30 NOTE — Progress Notes (Signed)
 Specialty Pharmacy Refill Note    Shailee Foots is a 43 y.o. female, who is being followed by [facility] team for management of: RxSp Migraine (Enrolled) for the following services:  Refill Management  Benefits and PA Management    Medications monitored by Specialty Pharmacy:   Atogepant  60 MG Tablet   TAKE 1 TABLET (60 MG) BY MOUTH NIGHTLY FOR MIGRAINE PREVENTION    botulinum toxin type A  (BOTOX ) 200 units injection   INJECT 155 UNITS INTRAMUSCULARLY EVERY 12 WEEKS    Rimegepant Sulfate  75 MG Tablet Dispersible   TAKE 1 TABLET (75 MG) BY MOUTH AS NEEDED (MIGRAINE)      Refill Coordination  Medications Linked to Program: Atogepant ; Rimegepant Sulfate   HIPAA verified (patient name & dob or patient name & address) with approved contact: Yes  How has _______ (specialty medication) helped you manage your _______ (condition) from very well to very poor: Well  Changes to allergies?: No  Changes to medications, herbals or supplements?: No  New conditions (diagnosis) since last pharmacy outreach: No  Since the last fill, has the patient experienced any unplanned office visit, urgent care, emergency room, or hospital admission: No  Since the last fill, any new or worsened side effects?: No  Financial problems or insurance changes : No  Since the last fill, has the patient missed any doses of their specialty medication : No  Doses left of specialty medications: >7  Does the patient have the correct number of remaining doses: Yes  Patient confirmations: received welcome packet, patient bill of rights, & privacy practices: Patient has received all  Patient confirmation: patient previously received the drug monograph(s) for their specialty medication(s): Yes    Delivery Information  Delivery confirmation: signature required for delivery: No  Delivery method: FedEx  Delivery confirmation: delivery address: Delivery Address Reviewed & Accurate  Enter delivery address: [address] [name]  [address]  Delivery phone number: [phone]  Delivery confirmation: patient informed of and confirms delivery date. Enter Delivery date: : 05/02/24  Preferred time?: Anytime  Number of medications in delivery: 1  Does patient have any concerns about safely storing medications (at the correct temperature, away from children/pets,etc.)?: No  Do any medications need mixed or dated?: No  Financial confirmation: patient informed of financial responsibility and copay amount: Yes  Financial responsibility: copay amount: 0.00  Copay form of payment: Credit card on file  Questions or concerns for the pharmacist?: No  Are any medications first time fills?: No  Additional assessment notes : 30/30, $0.00, 16/30, $0.00        Medicare Part B Fill? No    Giovannie Scerbo Matos-Pacheco

## 2024-05-01 ENCOUNTER — Encounter: Payer: Self-pay | Admitting: Internal Medicine

## 2024-05-01 ENCOUNTER — Ambulatory Visit: Payer: BLUE CROSS/BLUE SHIELD | Attending: Neurology | Admitting: Neurology

## 2024-05-01 ENCOUNTER — Other Ambulatory Visit: Payer: Self-pay

## 2024-05-01 ENCOUNTER — Encounter: Payer: Self-pay | Admitting: Neurology

## 2024-05-01 ENCOUNTER — Encounter: Payer: Self-pay | Admitting: No Specialty

## 2024-05-01 VITALS — BP 135/82 | HR 124 | Temp 97.7°F | Resp 18 | Ht 68.0 in

## 2024-05-01 DIAGNOSIS — R569 Unspecified convulsions: Secondary | ICD-10-CM

## 2024-05-01 DIAGNOSIS — M352 Behcet's disease: Secondary | ICD-10-CM

## 2024-05-01 DIAGNOSIS — M542 Cervicalgia: Secondary | ICD-10-CM

## 2024-05-01 MED ORDER — DIAZEPAM 5 MG PO TABS
5.0000 mg | ORAL_TABLET | Freq: Four times a day (QID) | ORAL | 0 refills | Status: DC | PRN
Start: 2024-05-01 — End: 2024-10-02

## 2024-05-01 NOTE — Progress Notes (Signed)
 Hailey [name]  [facility]  Telephone: [phone]  Fax: [phone]  Send us  a mychart or inbasket message for the fastest response  ~~~~~~~~~~~~~~~~~~~~~~~~~~~~~~~~~~~~~~~~~~~~~~~~~~~~~~~~~~~~~~~~~~~~~~~~~~~~~~~~~~~~  Visit Date: 05/01/2024    Referring Neurologist:     CC: Bechet's ; neuro Bechet's    HPI:   History was obtained from records review, patient,     43 y.o. year-old female with HLA-B51 pos Behcet's with neuro Behcet's (CSF matched OCB; neg brain mri).     She continues to have episodes of decreased awareness, seizure-like episodes, but decreasing in episode length (severity) but occurs every 2-3 weeks; lasts for 1-2 minutes.     She has more headaches in posterior head. She has considerable nausea nearly every day.     EXAM  Visit Vitals  BP 135/82 (BP Site: Left arm, Patient Position: Sitting, Cuff Size: Large)   Pulse (!) 124   Temp 97.7 F (36.5 C) (Oral)   Resp 18   Ht 1.727 m (5' 8)   LMP  (LMP Unknown)   SpO2 94%   BMI 47.67 kg/m     General:   Optic Nerves:   Psychiatric.   Mental Status: The patient was awake, alert, appropriate  Cranial Nerves: CN II-XII   Motor:   Reflex:   Sensation:   Coordination:   Gait:       Imaging     Reports:  No results found.     Testing       Labs  Lab Results   Component Value Date    HGBA1C 5.4 01/28/2023    EGFR >60.0 04/02/2024    CREAT 0.8 04/02/2024    WBC 13.33 (H) 04/02/2024    LYMPHOABS 2.5 07/25/2022    IGG 950 02/20/2023    IGA 216 01/25/2021    IGM 198 01/25/2021    B12 1,652 (H) 12/12/2023    VITD 39 12/12/2023       ASSESSMENT / PLAN  RTC: Return in about 1 month (around 05/31/2024) for [name].    1. Behcet's disease (CMS/HCC)    2. Seizure-like activity (CMS/HCC)    3. Cervicalgia      43 y.o. F with Bechet's.    She continues to have cognitive difficulty, improving with SLP.     She now has a new headache associated with constant nausea. It is posteriiorly located. I will check MRI brain & spine to evaluate  for worsening CNS bechet's in light of new symptoms. This is new in past 6 weeks.    If the MRi is OK, that would suggest optimization of migraine headache treatment would be needed.    Atomoxetine  can cause nausea, but she has been on 100mg  dose since late March and tolerating it well.      PLAN  - Continue remicade  5mg /kg, mtx 10mg , prednisone  10mg , otezla  30mg  bid. Agree with  potentially increasing MTX dose.   - continue cognitive rehab  - VPA 500mg  tid , LFT's improved with reduced dose.   - vimpat  200mg  bid  - atomoxetine  100mg  daily  - vitamin d3 2000 units daily    OK to write letter for gov't disability letter with template 5/29 (copying parts from VLTP letter I wrote 02/26/24)    Orders today (details below)  Orders Placed This Encounter   Procedures    MRI Thoracic Spine W WO Contrast    MRI Cervical Spine W WO Contrast    MRI Brain W WO Contrast  Orders Placed This Encounter   Medications    diazePAM  (VALIUM ) 5 MG tablet     Sig: Take 1 tablet (5 mg) by mouth every 6 (six) hours as needed for Anxiety (take 1/2 hour prior to the MRI)     Dispense:  5 tablet     Refill:  0       I spent 40 minutes reviewing the records, talking with the patient and developing their care plan. This excludes time for separately billed procedures.      Sammi Lockwood, MD PHD  Director, Neuroimmunology & MS Center  [facility]  [employer]  [address], [place], [place] [address]    Telephone [phone]  Fax [phone]   The fastest response is via [place]  ===================================================================  NFL (neurofilament light chain) Insurance Justification  Serum / plasma Neurofilament light chain is a well-established biomarker for monitoring MS, and regular testing has been incorporated into the Consortium of MS Centers [facility]) guidelines for the monitoring and clinical decision making for MS.     1)CMSC guidelines PDF:  https://mscare.http://www.stevens.com/     2) Homer MS, Zora GORMAN Britain RA, Peak, [place], Kuhle J, Lycke J, Olsson T; Consortium of Multiple Sclerosis Centers. Guidance for use of neurofilament light chain as a cerebrospinal fluid and blood biomarker in multiple sclerosis management. [employer]. 2024 Mar;101:104970. doi: 10.1016/j.ebiom.7975.895029. Epub 2024 Feb 13. PMID: 61645467; PMCID: [id number].    PATIENT INSTRUCTIONS PROVIDED  Patient was given an After Visit Summary with a copy of the testing orders, medications and the following other instructions:  There are no Patient Instructions on file for this visit.    IMPORTED INFORMATION FROM MEDICAL RECORDS   Diagnosis ICD-10-CM Associated Order   1. Behcet's disease (CMS/HCC)  M35.2 MRI Thoracic Spine W WO Contrast     MRI Cervical Spine W WO Contrast     MRI Brain W WO Contrast      2. Seizure-like activity (CMS/HCC)  R56.9 MRI Thoracic Spine W WO Contrast     MRI Cervical Spine W WO Contrast     MRI Brain W WO Contrast      3. Cervicalgia  M54.2 MRI Thoracic Spine W WO Contrast     MRI Cervical Spine W WO Contrast     MRI Brain W WO Contrast        Orders Placed This Encounter   Procedures    MRI Thoracic Spine W WO Contrast     Standing Status:   Future     Expected Date:   05/06/2024     Expiration Date:   05/01/2025     Scheduling Instructions:      3T MRI w/ MS protocol. 3T MRI w/ MS protocol. Call [facility] at [phone] or [facility] at [phone]. [facility] Fax: [phone]. [facility] OK as well.     Does the patient have a pacemaker or defibrillator?:   No     Release to patient:   Immediate     Is the patient pregnant?:   No     Reason for Exam::   MS f/u exam    MRI Cervical Spine W WO Contrast     Standing Status:   Future     Expected Date:   05/06/2024     Expiration Date:   05/01/2025     Scheduling Instructions:      3T MRI w/ MS protocol. Call [facility]  at [phone] or  imaging at [phone]  [phone]. [facility] Fax: [phone]. [facility] OK as well.     Does the patient have a pacemaker or defibrillator?:   No     Release to patient:   Immediate     Is the patient pregnant?:   No     Reason for Exam::   MS f/u exam    MRI Brain W WO Contrast     Standing Status:   Future     Expected Date:   05/06/2024     Expiration Date:   05/01/2025     Scheduling Instructions:      3T MRI w/ MS protocol. Call [facility] at [phone] or  imaging at [phone]. [facility] Fax: [phone]. [facility] OK as well.     Does the patient have a pacemaker or defibrillator?:   No     Release to patient:   Immediate     Is the patient pregnant?:   No     Reason for Exam::   neuro-bechet's

## 2024-05-01 NOTE — Patient Instructions (Signed)
 [facility]  MULTIPLE SCLEROSIS & NEUROIMMUNOLOGY CENTER  Telephone: [phone]  Fax: [phone]  Send us  a mychart or inbasket message for the fastest response  ~~~~~~~~~~~~~~~~~~~~~~~~~~~~~~~~~~~~~~~~~~~~~~~~~~~~~~~~~~~~~~~~~~    WELCOME TO THE NEUROIMMUNOLOGY CLINIC!    MULTIPLE SCLEROSIS AND NEUROIMMUNE DISEASE UPDATE  (Updated January 16, 2024)     1) Clinic Procedures  We only do in-person visits   For over 2000 years, medicine has been a face to face, personal enterprise -- and the best medicine happens face to face. Although telemedicine is very convenient (and was necessary during the pandemic), I've seen poor outcomes with telemedicine care. In-person evaluations are critical part of you doing well. As such we do not offer video visits. Anything minor that does not require a visit can be handled over the MyChart patient portal.     Regular, ongoing, follow up visits is also important. Each person is different, but we see the average patient every 3-6 months. We may not refill controlled substances if you haven't been seen within 3-6 months, and we may not refill other medications if you haven't been seen within 12 months.    Let us  know if your insurance changes. If you lose insurance and live in [place], [place] offers [facility]. [facility] [place] [facility] [place] & [facility] offer similar for patients living in other areas). See [email or web address]    Reaching us   - Mychart is best. We will respond within 2-3 business days. (We do not use regular email as policy)  - My nurses' direct phone [phone]. The main neurology line is [phone]. Leave a message and we will get back to you in a few days.  - After hours, the main line [phone]) will page the on-call neurologist.    You will probably hear back from my nurses. I communicate with them throughout the day. The advice they give you is coming from me - please take it  seriously.    There is a 2 week turnaround time for any forms.     Emergency / After Hours Care.   If you have questions after hours, call the main number [phone]) and you will reach the on-call neurologist who can help you.    If you have severe, acute or debilitating symptoms, go to the emergency room. But also let us  know via mychart or a phone call. I can best help you if you are at an [facility] hospital. You might not have a choice where the ambulance takes you; if you are not at an [facility] hospital, I'll try to help you the best I can. But I probably can't see everything that's going on.    Urgent appointments are available within a few days. My nurses have access to more appointment slots than the call center does.     Testing. Details matter.  The tests I order are specific and high-quality MS care requires advanced technology    - MRI's: we prefer to work with [facility] radiology or [facility]. Some of our patients are enrolled in studies at [facility], and that works as well. If you have had outside MRI's done previously, please bring the CD's so that we can upload them.    - LABS: Go to the lab indicated on the top of your lab order. Do not take an [facility] lab slip to [facility]- they will do it incorrectly. Do not go to [facility] or your primary care doctor. Most patients are sent to [facility] lab.    -  Results of testing will be communicated once they all return at follow-up visits. Many of the tests are nuanced and must be interpreted within the overall clinical picture. I do not call for results.      2) Testing Phone Numbers  MRI & CT:  [facility] at [phone]  [facility] Fax: [phone]  [facility] at [phone]  For insurance approval, reach out to them first. If there is a denial, let us  know ASAP as we can help.     LP / Spinal Tap: Call [phone] for Specialized Imaging Scheduling. Backup is [facility] at [phone]    Labs: Any  location.  [email or web address]    3) Doing well with Neuroimmune Disease  Symptoms don't tell you how the disease is doing.  You can experience more symptoms even if the disease isn't worsening. Fluctuating symptoms are expected. Environmental factors like infections, extremes of heat or cold, stress (emotional or physiologic), fatigue, infections, other medical diseases, and certain foods may make you feel more symptoms. That's not the disease worsening, it's not more neurologic damage, and it's not a relapse. Those are worsening symptoms. Most neuroimmune patients have good days & bad days. You might need symptomatic medications to help you function well.    Also, some diseases worsen without producing more symptoms.     In general, if you have a question about your symptoms or notice any new symptoms, please reach out.      Monitoring the Disease and Medication Response:  MS and neuroimmune diseases are caused by your body's immune system attacking the nervous system (brain, spinal cord, nerves or muscles). We use immune treatments to prevent more damage. We can measure whether those treatments are working through objective testing - labs, MRI's, EMGs, etc.    For MS, NMO or MOG, we know that everyone develop more lesions, neurologic problems on exam and symptoms unless they are treated (Dr Oneil Alas in [place] proved this). MRI is one of the best ways of detecting more damage - it shows up as a new lesion. The clinical exam and labs can also help us . In MS and MOG, a relapse without symptoms is very common, so we do frequent labs and yearly MRIs to monitor the disease. This is very important to detect subclinical progression of disease.     For other neuroimmune diseases -- eg,  myasthenia, CIDP/GBS, or rheumatologic diseases -- not everyone requires treatment. But we use labs, exam, MRI's and/or EMG to monitor the diseases    Medications are the start, but they aren't  everything. They won't repair damage that already happened, and they may not help symptoms. It is to prevent more damage.     Physical and Cognitive Activity Promote Neurologic Recovery: In order to improve the disease, the brain has to rewire around the damaged nerves. The best way to do this is by being active - physically and mentally. Physical, occupational or cognitive therapy can teach you exercises to address specific problems. Physical activity has been shown to improve energy, mood, and reduce pain. Be as physically and mentally active as you can; learn to pace yourself.    Diet: There is no such thing as an MS diet or anti-inflammatory diet. Foods don't affect MRI lesions or prevent neurologic damage, but a good or bad diet can markedly impact symptoms. My suggestion is to eat healthy - we all know what that is! Diets like the Wahl's protocol or Swank diet have ideas worth  trying, but don't feel obligated to follow them precisely. See if adding fiber, reducing gluten, reducing dairy or eliminating processed foods makes a difference in how you feel.    Smoking makes MS worse - it causes more lesions & bigger lesions.     Cannabis worsens cognitive symptoms of MS (Brain fog).    Vitamins and Supplements  Vitamins can affect neurologic functioning:  - Vitaimn D and MS. High vit D levels correlate with better outcomes in MS patients. Target a total (25-OH) Vit D level between 50-100. Vitamin D3 (CHOLEcalciferol; white pill) works better than Vitamin D2 (ERGOcalciferol; green pill). You can get high doses of vitamin D3 from amazon (eg, 10,000 or 50,000 units).     - Target vit B12 level above 500. Levels below 400 can cause nerve & spinal cord degeneration. Levels above the normal range are perfectly safe.    - Vitamin B6 (pyridoxine) and zinc toxicity cause neuropathy. Do not take B-complex or zinc. Other sources include multi-vitamins, some energy drinks, and other supplements. Check the back of the  bottle, not just the front. Candance Cary CD. Pyridoxine toxicity courtesy of your local health food store Ann Rheum Dis. 2006 Dec;65(12):1666-7]    - marked copper deficiency also causes nerve & spinal cord degeneration    - the data around other supplements like biotin, alpha-lipoic acid, etc is unclear, so I generally don't recommend them    Support Groups  NMSS [place] (via Zoom) Elveria Sever. Her cell is [phone].  MS Alliance of [place] / [place] / Donneta GLENWOOD Elvie Cherilyn [phone]   NMSS [place] Kim Kiggins. [phone]. [email or web address]  NMSS [place]  / online Speak MS To Me. Odella Geralynn Blush. [phone]. [email or web address]. Monday 2 PM    National MS Society [facility]): [email or web address].[email or web address]  Myasthenia Gravis Foundation of [place] (MGFA): MoralGame.si  [employer] for NMO: https://www.sumairafoundation.org/  [employer] for NMO: https://guthyjacksonfoundation.org/  Foundation for Sarcoid Research  DiscFull.com.cy    3) Neurologic Disease, Immune Suppressants and Infections  - MS, NMO, MOG, and GBS/CIDP themselves do not increase your risk of infection (including COVID). Severe myasthenia might increase your risk for lung infections    - Call us  if you develop a fever more than 101 F, have a green-yellow cough longer than 1 week, or if you have recurring or difficult-to-treat infections. Immune suppressants can increase infection risk. In this situation, you will probably require antibiotics (and stronger and longer than normal).    - None of the MS medications increase your COVID risk above that of an untreated patient (COVIMS study)    - Infections can cause more symptoms. That's usually not disease worsening or neurologic damage from infection. That is symptom worsening, similar to what happens with stress or the heat. You will get better a few days after  the infection improves.    - Rarely, COVID can cause neurologic complications. Early on, with severe COVID, we saw a lot of strokes and seizures. We see less of that now. But, we are seeing the emergence of a post-COVID neurologic syndrome that looks like Guillain-Barre syndrome or transverse myelitis. The way to test for this is with a spinal tap (lumbar puncture). Neuro-COVID is treatable.    4) Vaccines  Safe Vaccines, regardless of immunosuppressant  - Pfizer, Moderna, Novavax and Booster COVID vaccines are safe in MS and with all immunosuppressants. (There were issues with J&J and Astra-Zeneca).    - Flu vaccine. Use Flublok brand only (recombinant  hemagluttin quadrivalent). This is per FDA guidance for all neurologic patients. It can be difficult to find and we can give you an RX for it.     - Shingles vaccine. Use Shingrix (RZV, recombinant zoster) vaccine. This is per FDA guidance for all neurologic patients.     - All pneumonia vaccines are safe (Prevnar & Capvaxive )    - Tetanus toxoid and TDAP are ok (TDAP is preferred over DTAP).     Unsafe Vaccines (for any neuroimmune patients)  - Live virus or inactivated virus vaccines are contraindicated in neuro-immunologic patients (Including measles vaccine - this is contraindicated)    - monkeypox vaccines (JYNNEOS and ACAM2000) are contraindicated in MS and patients on immunosupressants.     - RSV vaccines are associated with cases of Guillian Barre syndrome, so I would avoid them.     - J&J and Astra Zeneca COVID vaccine were associated with transverse myelitis    5) Other Notes:  - Mavenclad: The contraindication and timing requirements do not apply to most vaccines. They are only for live virus vaccines, which are contraindicated anwyays    - You don't need to time your vaccinations with infusion dates. There have been theoretical concerns about immunosuppression affecting vaccine effectiveness, but recent research has shown this not to be true.    - MS is  compatible with pregnancy but we need to know if you plan on getting pregnant or become pregnant as we may need to adjust your treatment. Certain disease modifying treatments for MS are better than others for those seeking to become pregnant. Please also let me know when you find out you're pregnant.     - I do not prescribe long term opioids for nerve pain. Prescription opioids lead to worse MS symptoms, with studies showing significantly worse fatigue scores and worse pain scores compared to non-opioid users. Neurological pain is best managed by evidence-based non-opiate neuropathic pain medications. Complicated chronic pain management or non-neurological pain management will be referred to pain clinic.     - I do not recommend the use of marijuana or ingestion of THC /CBD containing products. Studies show marijuana worsen cognitive scores in MS and contribute to worsened brain fog and fatigue. Studies do not support the use of medical marijuana for spasticity or pain given a lack of objective efficacy to relieving spasticity. Topical CBD creams and ointments available over hte counter are safer as very little gets into your bloodstream. Instead, we use a comprehensive evidence-based approach in managing spasticity and pain that is individualized to the patient.      6) DMT Patient Assistance Phone Numbers  If you run into insurance / supply issues with a branded DMT, you may be able to get a temporary supply from the manufacturer. Also call us  & your infusion center.     Briumvi (TG [employer]). [phone]. Fax [phone]    Soliris & Ultomiris (Alexion One Source). [phone]. Fax [phone]. [email or web address]    Uplizna (Horizon By Your Side) [phone]. Fax [phone]    Enspryng [facility] Access Solution) [phone]. Fax [phone]. Text [phone]    Zilbrysq ([facility], Onward). [phone]. Fax [phone].     Kesimpta (Novartis Alongside). [phone]. Fax  [phone]    Mavenclad (EMD Serono MS Lifelines). [phone]. Fax [phone]    Ocrevus & Ocrevus [facility] [employer]). [phone]. Fax [phone]. Text [phone]    Zeposia (Bristol Billy [employer]b One Source) [phone]; Fax [phone]    Vyvgart & Vyvgart Hytrulo (Argenx MyPath) [phone]. Fax [phone]  Infusion Centers  [facility]  [address] [place], [place] [address]  Phone: [phone]    Bartlett Bartlett Fax [phone]    [place]  Marval Sammi Lockwood, MD PHD  Director, Neuroimmunology & MS Center  [facility]  [address], [place], [place] [address]    Telephone [phone]  Fax [phone]   The fastest response is via [place]

## 2024-05-02 ENCOUNTER — Ambulatory Visit: Payer: BLUE CROSS/BLUE SHIELD

## 2024-05-02 DIAGNOSIS — R41841 Cognitive communication deficit: Secondary | ICD-10-CM

## 2024-05-02 DIAGNOSIS — R41844 Frontal lobe and executive function deficit: Secondary | ICD-10-CM

## 2024-05-02 DIAGNOSIS — R41840 Attention and concentration deficit: Secondary | ICD-10-CM

## 2024-05-02 DIAGNOSIS — R413 Other amnesia: Secondary | ICD-10-CM

## 2024-05-02 NOTE — Progress Notes (Signed)
 [facility]  [address], [address]  [place], [place]  [address]  Phone:  [phone]  Fax:  [phone]    SPEECH THERAPY DAILY TREATMENT NOTE    PATIENT: Hailey [name] DOB: 19-Jan-1981   MR #: [id number]  AGE: 43 y.o.    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.3]  TREATMENT DIAGNOSIS: Executive function deficit R41.844 Memory Deficits R41.3 Attention Deficits R41.840 Cognitive-Communication Deficit R41.841     Date of Service SLP Received On: 05/02/24   Treatment Time Start Time: 1100 to Stop Time: 1200   Time Calculation Time Calculation (min): 60 min   Visit # SLP Visit Number: 16   Units Billed   SLP Treatments  $ SLP Treatment Indiv [phone]): 1 Procedure     Medications:  has a current medication list which includes the following prescription(s): otezla , atogepant , atomoxetine , botulinum toxin type a , buspirone , clindamycin , colesevelam , dapsone, diazepam , dicyclomine , divalproex  (er) extended release, dorzolamide-timolol, eletriptan , epinephrine , folic acid , hydroxyzine , infliximab , lacosamide , lidocaine -prilocaine, melatonin-pyridoxine, methocarbamol , methotrexate , multiple vitamin, naltrex, omeprazole , ondansetron , prednisone , rimegepant sulfate , valacyclovir , cyanocobalamin , and vitamin d , and the following Facility-Administered Medications: botulinum toxin type a  and onabotulinumtoxin a.  Patient/Caregiver does not report changes to medication at this time.    Precautions:   Seizures - Most recent seizure: December 16, 2023                    Rescue Plan: No rescue plan, typically come out of it after 90 seconds                    At times a sense of deja vu and then unresponsive     Allergies:  Allergies[1]    SUBJECTIVE REPORT:  Patient presents for speech  therapy today alone. Pt reports nausea has continued over the last week with headache near back of the head. Pt she saw her neurologist that manages Behcet's yesterday; recommended MRI of brain, cervical, and thoracic spine. Pt reports continued use of Zofran . Pt reports trying to conserve energy for afternoons due to increased nausea in the AM.    Pain:    4 on a scale of 1 - 10; location: SI joints      OBJECTIVE FINDINGS:  Interventions / Goal Status:  Goals copied from last visit:    Short Term Goals:  To be met by 6 weeks from 03/14/2024   Pt will utilize attentive reading and constrained summarization when reading short paragraphs (5-7 sentences) of material relevant to their interests and daily life (personal letters, news, magazine articles, etc.) and answer questions regarding material with 90% acc when provided with min cues across 2 sessions to improve reading comprehension.      Long Term Goals:  To be met by 12 weeks from 03/14/2024  Pt will report improved cognitive communication skills demonstrated by an increased score on patient reported outcome measure (PROMIS Item Bank v2.0 - Cognitive Function - Short Form 8a) from a baseline score of 16/40.  Pt will verbalize at least 2 processing strategies (i.e. requesting clarification, requesting repetition, paraphrasing information, etc.) and describe at least 2 situation where strategies were utilized during functional tasks across a 7 day period (e.g. medical appointments, phone calls, etc.), based on pt report.  GOAL  STATUS: MET 05/02/2024   Pt will verbalize at least 3 word finding strategies (e.g. extra time, script training, synonyms, etc.) and describe at least 2 situations where strategies were utilized during functional tasks (e.g.conversation with husband, friends, medical professionals, etc.), based on pt report.         GOAL # INTERVENTIONS PROGRESS   STG 1 Reading strategies - Supportive environment, identify purpose of reading, attentive reading  and constrained summarization, taking notes, etc. PROGRESSING:    Pt read 5 sentences of material and answered questions with 90% acc when provided with priming for strategy use.        LTG 1  NOT ADDRESSED TODAY.   LTG 2 Processing strategies - Take notes, ask speaker to slow down, request clarification/repetition, paraphrase/summarize information MET.    Pt able to verbalize 2 processing strategies and identified 2 situations in which strategies were utilized.    LTG 3  PROGRESSING:    Pt able to verbalize 3 word finding strategies and identified 1 situation in which strategies were utilized.      Education/Home Exercise Program:    Patient was educated on strategies and techniques used in treatment session.    HEP: Reading Strategies    Response to Tx Today:  Constant Therapy and Advanced Language by Tactus Therapy introduced to utilize at home to assist with cognition and word finding. Pt receptive to use and agreeable to trial as HEP.       ASSESSMENT: Hailey [name] is a 43 y.o. female presents to outpatient therapy today with mild cognitive communication impairments, mild memory impairments, and mild executive function impairments. These deficits impact the pt's ability to complete functional tasks in the home such as cooking, organizing a list, reading complex material, and recalling medical information. Pt warrants skilled SLP intervention to address deficits and train cognitive linguistic compensatory strategies for increased independence in functional tasks.     The following impairments were noted:  Mild cognitive communication impairment, as characterized by deficits in word finding in conversation, phonemic fluency, comprehension of basic and complex written material, and problem solving.  Mild memory impairment, as characterized by impairments in delayed recall of new information.   Mild executive function and attention impairment, as characterized by impairments in sustained attention,  alternating/divided attention, and planning/organizing.      Patient presents with the following functional limitations:  Patient has difficulty with communicating at the level of conversation.  Unable to effectively implement attention / memory strategies for sustained attention, alternating/divided attention, and recall of new information.  Difficulty with executive function skills such as  problem solving and planning/organizing.     Without intervention, patient is at risk for:  Decreased social interactions that may lead to further cognitive decline and contribute to further medical complications and increased fall risk.  Inability to hold current job position and loss of income.  Burden of care.  Depression.    Patient is progressing well toward above mentioned functional goals.    PLAN:   It is medically necessary for Hailey [name] to recieve skilled speech therapy intervention in order to address the above impairments and functional limitations.    Recommend continue per plan of care to work toward increased cognitive and communication abilities for safety and independence with daily activities and in the community.       NEXT VISIT: Review constant therapy and advanced language tactus therapy app    Therapist Signature:    Bernarda Hench M.A., CCC-SLP  Speech Language  Pathologist  [facility]  Outpatient Specialty Rehabilitation  [email or web address]    05/02/2024         [1]   Allergies  Allergen Reactions    Fruit Blend Flavor [Flavoring Agent (Non-Screening)] Anaphylaxis and Swelling     Tropical Fruit     Latex Drug-Induced Flushing, Hives, Other (See Comments), Swelling, Itching and Rash    Cyanoacrylate Itching and Rash    Other Itching and Rash     Sutures Monocryl    Oxycodone Hallucinations, Hives and Other (See Comments)     Hallucinations, took 1 time 25 years ago    Wound Dressing Adhesive Itching and Other (See Comments)     adhesive

## 2024-05-06 NOTE — Progress Notes (Signed)
 ASSESSMENT  1. 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  LDN has proved to be a helpful molecule for her, recently had her methotrexate  increased which also might be contributing to pain relief.  She has been seeing Dr. Myrle for left ankle swelling, she is going to get worked up by lymphedema clinic but might end up having surgery to remove fat pad/lipoma.     --------------------------------------------  This patients pain is primarily experienced between the upper level of the iliac crests and the gluteal fold. Clinical findings and imaging studies suggest no other obvious cause of the pain. She has Reproduction of pain using the following provocative tests  bilateral FABER/Patrick's Test, Thigh Thrust , Sacroiliac Joint Compression Test. The pain has be presents for more than 4 weeks and has failed conservative management anti-inflammatories, analgesic medications , physical therapy, activity modification, and exercise.  This patient is actively involved in a home exercise program     PLAN    Repeat SIJ bilateral CSI  Plan for FU w/ Dr. Zona, this is 3rd consecutive visit with a PA-C  C/W Dr. Myrle for left ankle  LDN    Orthotist for heel lift - hold for now    HISTORY OF PRESENT ILLNESS  Chief Complaint: No chief complaint on file.   Age: 43 y.o.  Sex: female   Hand-dominance: right    To recap  Patient has a past medical history of Behcet's and neuro Behcet's, we have been treating her for bilateral sacroiliac joint dysfunction.    Saw Dr. Myrle on 03/28/24 for ongoing L ankle swelling and pain, US  r/o DVT, MRI wnl, referred to lymphedema clinic (not seen yet) and also discussed possible surgical removal of fat pad vs lipoma at lateral ankle.     Results  MRI lumbar spine  from February 2024 very mild degenerative changes at the L4-L5 level, and her scoliosis is appreciated, but she does not have any nerve impingement and really minimal disc degeneration      CT lumbar spine from December 2024 arthritis at L4-L5 and L5-S1, little bit more in the left compared to the right      Interventions  12/06/2023: Left SIJ injection -helped a lot for about 2 months  01/15/2024: Right SIJ injection-very helpful for 6 weeks  Serola belt - helpful for ambulating and stairs, but can't sit with it on     Today  LDN has been very helpful, incomplete but definitely worth continuing.      Rarely needs the robaxin , and with the LDN does not need the hydrocodone .    Left >> right SIJ pain, serola belt helps when ambulating but cannot sit with it on.      Current meds  Robaxin  very PRN - helps              Remicade               Methotrexate  - recently increased to four pills              Modafinil                Prednisone  10mg  QD              Zofran  PRN - helpful  LDN 4.5 mg QD -  helps                Prior meds              Oxycodone - allergic              Tramadol  - ineffective              Butrans 10 mcg - helped with pain but caused significant nausea and sedation  Hydrocodone  10mg  - helps, not needed on LDN    OBJECTIVE    General  Alert and oriented x 4  Head: normocephalic and atraumatic  Lungs: breathing is steady and unlabored    No TTP lumbar paraspinal musculature bilaterally  TTP Left > Right SIJ    Some decrease strength diffusely of the left lower extremity chronic due to Behcet's per patient.   Ambulates with a single point cane    Leg length discrepancy, right hip is slightly higher  Left lateral ankle edema, nonpitting, nonerythematous    PROCEDURES  Procedures    This examination was performed under the supervision of Edsel Aver, MD.  Leita Maze, PA-C

## 2024-05-09 ENCOUNTER — Ambulatory Visit: Payer: BLUE CROSS/BLUE SHIELD | Attending: Neurology

## 2024-05-09 DIAGNOSIS — R41844 Frontal lobe and executive function deficit: Secondary | ICD-10-CM | POA: Insufficient documentation

## 2024-05-09 DIAGNOSIS — M352 Behcet's disease: Secondary | ICD-10-CM | POA: Insufficient documentation

## 2024-05-09 DIAGNOSIS — R413 Other amnesia: Secondary | ICD-10-CM | POA: Insufficient documentation

## 2024-05-09 DIAGNOSIS — R41840 Attention and concentration deficit: Secondary | ICD-10-CM | POA: Insufficient documentation

## 2024-05-09 DIAGNOSIS — R41841 Cognitive communication deficit: Secondary | ICD-10-CM | POA: Insufficient documentation

## 2024-05-09 NOTE — Progress Notes (Signed)
 [facility]  [address]  [place], [place]  [address]  Phone:  [phone]  Fax:  [phone]    SPEECH THERAPY DAILY TREATMENT NOTE    PATIENT: Hailey [name] DOB: 10-Aug-1981   MR #: [id number]  AGE: 43 y.o.    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.3]  TREATMENT DIAGNOSIS: Executive function deficit R41.844 Memory Deficits R41.3 Attention Deficits R41.840 Cognitive-Communication Deficit R41.841     Date of Service SLP Received On: 05/09/24   Treatment Time Start Time: 1100 to Stop Time: 1200   Time Calculation Time Calculation (min): 60 min   Visit # SLP Visit Number: 17   Units Billed   SLP Treatments  $ SLP Treatment Indiv [phone]): 1 Procedure     Medications:  has a current medication list which includes the following prescription(s): otezla , atogepant , atomoxetine , botulinum toxin type a , buspirone , clindamycin , colesevelam , dapsone, diazepam , dicyclomine , divalproex  (er) extended release, dorzolamide-timolol, eletriptan , epinephrine , folic acid , hydroxyzine , infliximab , lacosamide , lidocaine -prilocaine, melatonin-pyridoxine, methocarbamol , methotrexate , multiple vitamin, naltrex, omeprazole , ondansetron , prednisone , rimegepant sulfate , valacyclovir , cyanocobalamin , and vitamin d , and the following Facility-Administered Medications: botulinum toxin type a  and onabotulinumtoxin a.  Patient/Caregiver does not report changes to medication at this time.    Precautions:   Seizures - Most recent seizure: December 16, 2023                    Rescue Plan: No rescue plan, typically come out of it after 90 seconds                    At times a sense of deja vu and then unresponsive     Allergies:  Allergies[1]    SUBJECTIVE REPORT:  Patient presents for speech  therapy today alone.     Pt reports I had to make a lot of appointments this week, so July is a busy month of appointments. Pt also expresses difficulty responding to emails, relying on spouse to assist with responses/typing.    Pain:    7 on a scale of 1 - 10; location: SI joints      OBJECTIVE FINDINGS:  Interventions / Goal Status:  Goals copied from last visit:    Short Term Goals:  To be met by 6 weeks from 03/14/2024   Pt will utilize attentive reading and constrained summarization when reading short paragraphs (5-7 sentences) of material relevant to their interests and daily life (personal letters, news, magazine articles, etc.) and answer questions regarding material with 90% acc when provided with min cues across 2 sessions to improve reading comprehension.      Long Term Goals:  To be met by 12 weeks from 03/14/2024  Pt will report improved cognitive communication skills demonstrated by an increased score on patient reported outcome measure (PROMIS Item Bank v2.0 - Cognitive Function - Short Form 8a) from a baseline score of 16/40.  Pt will verbalize at least 2 processing strategies (i.e. requesting clarification, requesting repetition, paraphrasing information, etc.) and describe at least 2 situation where strategies were utilized during functional tasks across a 7 day period (e.g. medical appointments, phone calls, etc.), based on pt report.  GOAL STATUS: MET 05/02/2024   Pt will verbalize at least 3 word finding strategies (e.g. extra  time, script training, synonyms, etc.) and describe at least 2 situations where strategies were utilized during functional tasks (e.g.conversation with husband, friends, medical professionals, etc.), based on pt report.    GOAL STATUS: MET 05/09/2024        GOAL # INTERVENTIONS PROGRESS   STG 1 Reading strategies - Supportive environment, identify purpose of reading, attentive reading and constrained summarization, taking notes, etc. PROGRESSING:    Pt read 5 sentences of  material and answered questions with 90% acc when provided with priming for strategy use.        LTG 1  NOT ADDRESSED TODAY.   LTG 2 Processing strategies - Take notes, ask speaker to slow down, request clarification/repetition, paraphrase/summarize information MET.    Pt able to verbalize 2 processing strategies and identified 2 situations in which strategies were utilized.    LTG 3  MET.    Pt able to verbalize 3 word finding strategies and identified 2 situations in which strategies were utilized.      Education/Home Exercise Program:    Patient was educated on strategies and techniques used in treatment session.    HEP: Reading Strategies    Response to Tx Today:  Cognitive pacing strategies reviewed; discussed use of smart phrases and/or templates when responding to emails due to pt report of difficulty responding to emails. Pt demonstrating increased independence with use of word finding strategies at home. STG 3 met as a result of increased use/independence.       ASSESSMENT: Caliyah Sieh is a 43 y.o. female presents to outpatient therapy today with mild cognitive communication impairments, mild memory impairments, and mild executive function impairments. These deficits impact the pt's ability to complete functional tasks in the home such as cooking, organizing a list, reading complex material, and recalling medical information. Pt warrants skilled SLP intervention to address deficits and train cognitive linguistic compensatory strategies for increased independence in functional tasks.     The following impairments were noted:  Mild cognitive communication impairment, as characterized by deficits in word finding in conversation, phonemic fluency, comprehension of basic and complex written material, and problem solving.  Mild memory impairment, as characterized by impairments in delayed recall of new information.   Mild executive function and attention impairment, as characterized by impairments  in sustained attention, alternating/divided attention, and planning/organizing.      Patient presents with the following functional limitations:  Patient has difficulty with communicating at the level of conversation.  Unable to effectively implement attention / memory strategies for sustained attention, alternating/divided attention, and recall of new information.  Difficulty with executive function skills such as  problem solving and planning/organizing.     Without intervention, patient is at risk for:  Decreased social interactions that may lead to further cognitive decline and contribute to further medical complications and increased fall risk.  Inability to hold current job position and loss of income.  Burden of care.  Depression.    Patient is progressing well toward above mentioned functional goals.    PLAN:   It is medically necessary for Wanda Rock Beck to recieve skilled speech therapy intervention in order to address the above impairments and functional limitations.    Recommend continue per plan of care to work toward increased cognitive and communication abilities for safety and independence with daily activities and in the community.       NEXT VISIT: Review constant therapy and advanced language tactus therapy app, read short article    Therapist Signature:  Bernarda Hench M.A., CCC-SLP  Speech Language Pathologist  [facility]  Outpatient Specialty Rehabilitation  [email or web address]    05/09/2024         [1]   Allergies  Allergen Reactions    Fruit Blend Flavor [Flavoring Agent (Non-Screening)] Anaphylaxis and Swelling     Tropical Fruit     Latex Drug-Induced Flushing, Hives, Other (See Comments), Swelling, Itching and Rash    Cyanoacrylate Itching and Rash    Other Itching and Rash     Sutures Monocryl    Oxycodone Hallucinations, Hives and Other (See Comments)     Hallucinations, took 1 time 25 years ago    Wound Dressing Adhesive Itching and Other (See Comments)      adhesive

## 2024-05-15 ENCOUNTER — Encounter (INDEPENDENT_AMBULATORY_CARE_PROVIDER_SITE_OTHER): Payer: Self-pay | Admitting: Physician Assistant

## 2024-05-16 ENCOUNTER — Other Ambulatory Visit (INDEPENDENT_AMBULATORY_CARE_PROVIDER_SITE_OTHER): Payer: Self-pay | Admitting: Physician Assistant

## 2024-05-16 ENCOUNTER — Other Ambulatory Visit (INDEPENDENT_AMBULATORY_CARE_PROVIDER_SITE_OTHER): Payer: Self-pay | Admitting: Residents

## 2024-05-16 NOTE — Telephone Encounter (Signed)
 Pt is coming in for f/u 7/16 and will refill then.

## 2024-05-16 NOTE — Telephone Encounter (Signed)
Refill request sent to [facility].

## 2024-05-20 ENCOUNTER — Other Ambulatory Visit: Payer: Self-pay

## 2024-05-22 ENCOUNTER — Ambulatory Visit (INDEPENDENT_AMBULATORY_CARE_PROVIDER_SITE_OTHER): Payer: BLUE CROSS/BLUE SHIELD | Admitting: Physician Assistant

## 2024-05-22 ENCOUNTER — Telehealth (INDEPENDENT_AMBULATORY_CARE_PROVIDER_SITE_OTHER): Payer: BLUE CROSS/BLUE SHIELD | Admitting: Physician Assistant

## 2024-05-22 ENCOUNTER — Encounter (INDEPENDENT_AMBULATORY_CARE_PROVIDER_SITE_OTHER): Payer: Self-pay | Admitting: Physician Assistant

## 2024-05-22 ENCOUNTER — Encounter (INDEPENDENT_AMBULATORY_CARE_PROVIDER_SITE_OTHER): Payer: BLUE CROSS/BLUE SHIELD | Admitting: Internal Medicine

## 2024-05-22 ENCOUNTER — Encounter (INDEPENDENT_AMBULATORY_CARE_PROVIDER_SITE_OTHER): Payer: Self-pay

## 2024-05-22 ENCOUNTER — Ambulatory Visit (INDEPENDENT_AMBULATORY_CARE_PROVIDER_SITE_OTHER): Payer: BLUE CROSS/BLUE SHIELD

## 2024-05-22 VITALS — Ht 68.0 in | Wt 310.0 lb

## 2024-05-22 VITALS — BP 136/90 | HR 117 | Temp 98.4°F | Resp 16 | Ht 68.0 in | Wt 310.0 lb

## 2024-05-22 DIAGNOSIS — R051 Acute cough: Secondary | ICD-10-CM

## 2024-05-22 DIAGNOSIS — K1120 Sialoadenitis, unspecified: Secondary | ICD-10-CM

## 2024-05-22 DIAGNOSIS — R112 Nausea with vomiting, unspecified: Secondary | ICD-10-CM

## 2024-05-22 DIAGNOSIS — J189 Pneumonia, unspecified organism: Secondary | ICD-10-CM

## 2024-05-22 DIAGNOSIS — M352 Behcet's disease: Secondary | ICD-10-CM

## 2024-05-22 DIAGNOSIS — Z79899 Other long term (current) drug therapy: Secondary | ICD-10-CM

## 2024-05-22 DIAGNOSIS — R070 Pain in throat: Secondary | ICD-10-CM

## 2024-05-22 DIAGNOSIS — R6881 Early satiety: Secondary | ICD-10-CM

## 2024-05-22 LAB — STREP A POCT GOHEALTH: Rapid Strep A Screen POCT: NEGATIVE

## 2024-05-22 MED ORDER — OMEPRAZOLE 40 MG PO CPDR: 40.0000 mg | DELAYED_RELEASE_CAPSULE | Freq: Every day | ORAL | 1 refills | Status: DC

## 2024-05-22 MED ORDER — AZITHROMYCIN 250 MG PO TABS
ORAL_TABLET | ORAL | 0 refills | Status: AC
Start: 2024-05-22 — End: 2024-05-27

## 2024-05-22 MED ORDER — AMOXICILLIN-POT CLAVULANATE 875-125 MG PO TABS
1.0000 | ORAL_TABLET | Freq: Two times a day (BID) | ORAL | 0 refills | Status: AC
Start: 2024-05-22 — End: 2024-05-30

## 2024-05-22 NOTE — Progress Notes (Signed)
 [facility]  OFFICE NOTE         Subjective   Historian: Hailey [name]      Chief Complaint   Hailey [name] presents with    Sore Throat     Redness on salivary glands under the tongue, sore throat, hurts to swallow, productive cough, slight phlegm. It started 2 days ago.        History of Present Illness  Hailey [name] is a 43 year old female with Behcet's disease who presents with a lump under her tongue that she is concerned is an inflamed salivary gland, sore throat, and cough.    She has a small bump under her tongue, which appeared with white exudate last night. The exudate was less noticeable this morning and absent by the afternoon, possibly due to eating and drinking. The back of her throat is irritated, though less so than under her tongue.    She is concerned about a potential infection, especially with upcoming medical procedures including an SI joint injection and a Remicade  infusion scheduled for Monday and Tuesday, respectively. She experienced a fever of 101F last night, which has since resolved. Fevers are not unusual for her due to Behcet's disease.    She reports a sore throat and a productive cough that started a couple of days ago. No trouble breathing but mentions some swelling under her jaw, which feels harder than usual. She has a history of oral lesions associated with Behcet's disease, but these have been significantly reduced with Otezla , which she has been taking consistently.    She is currently on immune-suppressing medications. She is allergic to opioids and topical medications, which cause adverse reactions.    History:  Medications and Allergies reviewed.   Pertinent Past Medical, Surgical, Family and Social History were reviewed.          Objective     Vitals:    05/22/24 1717   BP: 136/90   Pulse: (!) 117   Resp: 16   Temp: 98.4 F (36.9 C)   TempSrc: Tympanic   SpO2: 97%   Weight: (!) 140.6 kg (310 lb)   Height: 1.727 m (5' 8)     Physical Exam  Constitutional:        Appearance: Normal appearance.   HENT:      Head: Normocephalic.      Right Ear: Tympanic membrane, ear canal and external ear normal.      Left Ear: Tympanic membrane, ear canal and external ear normal.      Nose: Nose normal.      Mouth/Throat:      Mouth: Mucous membranes are moist.      Comments: Focal erythema and swelling along lingular frenulum, soft floor of mouth    Eyes: Conjunctivae are normal. Cardiovascular:      Rate and Rhythm: Normal rate and regular rhythm.      Heart sounds: Normal heart sounds.   Pulmonary:      Effort: Pulmonary effort is normal. No respiratory distress.      Breath sounds: Normal breath sounds. No stridor. No wheezing, rhonchi or rales.   Musculoskeletal:         General: Normal range of motion.      Cervical back: Normal range of motion and neck supple.   Lymphadenopathy:      Cervical: Cervical adenopathy present.   Neurological:      General: No focal deficit present.      Mental Status: She is  alert.   Skin:     General: Skin is warm and dry.   Psychiatric:         Mood and Affect: Mood normal.         Behavior: Behavior normal.   Vitals and nursing note reviewed.        Physical Exam    Urgent Care Course   LABS  The following POCT tests were ordered, reviewed and discussed with the Hailey [name]/family.     Results for orders placed or performed in visit on 05/22/24 (from the past 24 hours)   Rapid Strep A POCT    Collection Time: 05/22/24  5:47 PM   Result Value    POCT QC Pass    Rapid Strep A Screen POCT Negative     XR Chest 2 Views  Result Date: 05/22/2024  1.Possible trace right pleural effusion. 2.Right basilar opacity which could represent atelectasis and/or pneumonia. Hailey Dominica, MD 05/22/2024 6:09 PM     X-ray ordered and images reviewed.   Preliminary reading: 2 view XR of chest reveals faint opacity at right lung base  Results of x-ray discussed with the Hailey [name]/family.   Radiologist reading also reviewed and copy of report provided to Hailey [name].           Procedures   Procedures     Assessment / Plan     Yancy Tangia Pinard is a 43 y.o. female with a past medical history significant for Behcet's disease who presents with sore throat and productive cough x 3 days, fever yesterday, and and swelling under her tongue x 1 day. Hailey [name] appears well and nondistressed.  Exam notable for focal erythema and swelling along the lingular frenulum, soft floor of the mouth, no trismus or drooling, minimal cervical adenopathy appreciated.  Lungs CTA bilaterally, no respiratory distress.  Vital signs are notable for tachycardia, suspect related to underlying illness.    History, exam, and testing is most consistent with community-acquired pneumonia, right lower lobe, sialoadenitis    Differential Diagnoses including but not limited to: Sinusitis, Bronchitis, Pharyngitis, Influenza, COVID-19 Behcet's lesions, HSV, allergic Rhinitis      Assessment & Plan  Community-acquired pneumonia and sialadenitis  Reviewed x-ray results with Hailey [name]  Augmentin  and azithromycin  as prescribed  Encourage Hailey [name] to suck on sour candies  Push p.o. fluids  OTC decongestants and cough medications as needed  Close follow-up with rheumatology and primary care  Hailey [name] referred to ENT  ER/return precautions reviewed with Hailey [name] and family         Hailey was seen today for sore throat.    Diagnoses and all orders for this visit:    Community acquired pneumonia of right lower lobe of lung  -     azithromycin  (ZITHROMAX ) 250 MG tablet; Take 2 tabs daily on day 1, then 1 tablet daily for 4 days.  -     amoxicillin -clavulanate (AUGMENTIN ) 875-125 MG per tablet; Take 1 tablet by mouth 2 (two) times daily for 7 days    Pain in throat  -     Rapid Strep A POCT; Future  -     Rapid Strep A POCT    Acute cough  -     XR Chest 2 Views; Future    Sialadenitis  -     Referral to ENT (EXTERNAL); Future  -     amoxicillin -clavulanate (AUGMENTIN ) 875-125 MG per tablet; Take 1 tablet by mouth 2 (two) times daily  for 7 days  The indications for early follow-up with PCP and return to UC were discussed. Hailey [name]/family received education on the working diagnosis, diagnostic uncertainties, and proposed treatment plan. Indications for emergency evaluation in the ED were reviewed. Written and verbal discharge instructions were provided and discussed and all questions from the Hailey [name]/family were addressed, with no apparent barriers.      Verbal consent obtained to record this visit.

## 2024-05-22 NOTE — Progress Notes (Signed)
 Andrea Lights, PA-C  [facility]   [address]   [place], [place] [address]   Phone [phone] Fax [phone]      Date Time: 05/22/2024 9:53 AM   Patient Name: Hailey [name], Hailey [name]     Reason for Consultation:   F/u nausea and vomiting     Verbal consent has been obtained from the patient to conduct a telemedicine visit. Verbal consent obtained to record this visit.   History:   Kaira Stringfield is a 43 y.o. female PMH Behcet's, WPW, NICM, PVCs, s/p CCY, hysterectomy, who presents to the office for follow-up, diarrhea, irregular bowel habits.  She previously was seen 10/03/2023 with Dr. Nigam and recently with me 12/2023 for same complaints.  Was advised to try Benefiber twice daily, MiraLAX, Tums, Gas-X, famotidine , omeprazole  40 mg as symptoms controlled on this regimen.       - on Infliximab  for Behcets   - adding Strattera  today per neurologist further treatment of Behcet's w/ CNS involvement  - Hx of CCY 2009 for nonspecific GI sx, no gallstones  - EGD/Colonoscopy 03/2023- gastric erythema, large ext hemorrhoids. Path: gastric bx nl, colonoscopy bx nl    Sxs:   - chronic nausea which started a few weeks ago, occ regurgitation and vomiting nbnb emesis   - gagging and dry heaving   - in am, but can occur during the day   - not worse post prandial   - no reflux or hb, no dysphagia or odynophagia   - taking zofran  prn x 2 mos, but causes constipation   - bms are now more regular,1-2x/day, even though not consisently taking fiber supp or Miralax   - notes omeprazole  40 mg helps a lot as well, and taking bentyl  helps with occ cramping      - prior to onset of sx notes that did have a pain med patch prescribed for SI joint dysfunction  - sxs started a few days to a week after   - also is on naltrexone for SI joint issues - possibly may have contributed to nausea and vomiting (known AE)   - methotrexate  for behcet and increased dose 4 weeks ago, after the sxs started   -  takes depakote  lunch time and in evening bu sxs of n/v primarily in am   - has been eating small meals which helps a little   - also endorses early satiety   - has not had a GES   - h/o current narcotic use and no h/o DM   - weight stable     -Denies any symptoms of abdominal pain, hematemesis, fever, or chills, loss of appetite or unintentional weight loss.  No upper GI complaints of acid reflux, heartburn, dysphagia or odynophagia.      - FH: No known family history of esophageal, stomach, colon cancer or other gastrointestinal cancers   - Non-smoker, no alcohol use, no recreational drug use including marijuana use  Past Medical History:     Past Medical History:   Diagnosis Date    Abdominal hernia 2016    Resolved by surgical repair    Abnormal vision March 2024    reading glasses    Amenorrhea 1999    Resolved by hysterectomy    Anxiety     Arrhythmia     WPW ablation in 2005, 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 Released from cardiology    Atrial flutter (CMS/HCC) 20+ years    Previous history of WPW  Behcet's disease (CMS/HCC)     She sees Dr. 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    02/11/23 stable on medical    Diarrhea     Dizziness 15+ years    Allergies, migraine    Ear, nose and throat disorder     seasonal allergies    Fatigue 2019    Bechet's    Fever 11/09/2022    Recurrent fever due to Behcet's Disease    Fracture of hand 2016    Right hand middle finger    Hard to intubate 2019    Headache 2002    migraine    History of adverse reaction to anesthesia 2005    Nausea    History of ulcer disease 2019    Bechets Disease    Hx of ovarian cyst     Lower back pain     not severe    Meningitis 2011    Viral // treated and resolved    Migraine 2002    Near syncope 1995    Previous history of WPW    Other physical therapy March 2023    Neck    Pain in joint 2019    RA part of  Bechets Disease    Pain in wrist 11/09/2022    Palpitations 20+ years    Previous history of WPW    Post-operative nausea and vomiting 2005    Rash 2019    Behcets disease - controlled by medication    Rheumatoid arthritis (CMS/HCC) 2019    Part of Behcets Disease    Scoliosis 1985    moderate// used a Brace at a young age    Shingles 2015    Swelling of extremity 2019    Left ankle    Uveitis     using eyedrops       Past Surgical History:     Past Surgical History[1]    Family History:     Family History[2]    Social History:     Social History[3]    Allergies:     Allergies[4]    Medications:     Current Medications[5]      Review of Systems:   ROS per HPI    Physical Exam:     Vitals:    05/22/24 0911   Weight: (!) 140.6 kg (310 lb)   Height: 1.727 m (5' 8)       Limited physical exam as this is a telemedicine visit.    General appearance - Well developed and well nourished.  No acute distress.   HEENT- Normocephalic. Eomi. Sclera anicteric. Normal hearing. Nares normal.  Oropharynx normal.  Neck - Supple.     Abdomen -  No abdominal distension.    Musculoskeletal - normal range of motion of arms and legs.  Extremities - no cyanosis   Skin - normal color   Neurologic - Alert and oriented to person, place and time. Recent and remote memory normal.     Assessment and Plan      Assessment & Plan  Chronic and episodic nausea and vomiting  Severe nausea and vomiting for 9-10 weeks, minimally responsive to Zofran . Possible contributors include naltrexone. Naltrexone known to cause GI motility issues and nausea and vomiting as adverse effects  - Discussed that  symptoms possible adverse effects from medication and consider trialing off after discussion with prescribing provider.  - Recommend 4 hour gastric emptying scan  - Will need to be off of naltrexone for at least 72 hours prior to the test  - Continue Zofran  as needed, use sparingly     May continue:  - Benefiber powder twice daily   - Miralax powder twice  daily  - Tums-GasX or famotidine  20 to 40 mg as rescue medications if needed  - Omeprazole  40 mg 30 min before breakfast  - Small volume frequent meals daily  - Reconsideration of colonoscopy for CRC screening in 03/2028 (5-year repeat), given immunosupp, adopted w/o known fam hx.   - Dicyclomine  as needed       All recent labs, radiologic studies, and procedure results were reviewed at the time of the visit.     40 minutes was spent on this encounter.  This includes the time spent reviewing pathology and imaging reports prior to the encounter as well as time spent discussing the care with other physicians and also face to face time with the patient. This time excludes any separately reportable procedures.     Preparing to see the patient (e.g., review of tests)  Obtaining and/or reviewing separately obtained history  Performing a medically appropriate examination and/or evaluation  Counseling and educating the patient/family/caregiver  Ordering medications, tests or procedures  Communicating with other health care professionals (not separately reported)  Documenting clinical information in the electronic or other health record  Independently interpreting results (not separately reported) and communicating results to the patient/family/caregiver  Care coordination (not separately reported)     It is a pleasure to participate in the care of Christy L. Ipock. If you have any questions or concerns, please feel free to contact us  at [phone].      Signed by: Andrea CHRISTELLA Lights, PA       [1]   Past Surgical History:  Procedure Laterality Date    ABDOMINAL SURGERY  2016; 2012    Umbilical hernia; hysterectomy    ABLATION - PVC'S N/A 06/08/2022    Procedure: Ablation - PVC's;  Surgeon: Von Oh, MD;  Location: [facility];  Service: Cardiovascular;  Laterality: N/A;  SAME DAY DISCHARGE  CARTO NO TEE    ARTHROTOMY, WRIST Right 12/03/2020    Procedure: RIGHT UPPER EXTREMITY SYNOVIAL BIOPSY;  Surgeon: Bolling Oh, MD;   Location: [facility];  Service: Plastics;  Laterality: Right;    BIOPSY, LYMPH NODE N/A 07/23/2018    Procedure: BIOPSY, LYMPH NODE;  Surgeon: Gloris Wilkie FALCON, MD;  Location: [facility];  Service: ENT;  Laterality: N/A;  NECK DEEP LYMPH NODE BIOPSY    BONE MARROW BIOPSY  2020    led to behcet's dx    CARDIAC ABLATION  2005    [facility] for [facility]    CARDIAC CATHETERIZATION  2005    CHOLECYSTECTOMY  2009    COLONOSCOPY, DIAGNOSTIC (SCREENING) N/A 03/21/2023    Procedure: COLONOSCOPY, DIAGNOSTIC (SCREENING);  Surgeon: Leontine Countryman, MD;  Location: [facility];  Service: Gastroenterology;  Laterality: N/A;    DILATION AND CURETTAGE OF UTERUS  2010    EGD N/A 03/21/2023    Procedure: EGD;  Surgeon: Leontine Countryman, MD;  Location: [facility];  Service: Gastroenterology;  Laterality: N/A;    FINGER GANGLION CYST EXCISION  04/2018    FOOT SURGERY  2014    Ganglion cyst removal from Achilles  GANGLION CYST EXCISION  2014    04/2018 cyst removed [facility] index finger-[facility]    HAND SURGERY  January 2022    HERNIA REPAIR  2016    HYSTERECTOMY  2012    HYSTEROSCOPY  2011    indication: Abnormal bleeding    LAPAROSCOPIC, LYSIS, ADHESIONS N/A 11/09/2022    Procedure: LAPAROSCOPIC LYSIS OF ADHESIONS;  Surgeon: Jan, Ambareen G, MD;  Location: [facility];  Service: Gynecology;  Laterality: N/A;    LAPAROSCOPIC, SALPINGECTOMY Left 11/09/2022    Procedure: LAPAROSCOPIC, SALPINGECTOMY LEFT;  Surgeon: Madison Sedalia MATSU, MD;  Location: [facility];  Service: Gynecology;  Laterality: Left;    LAPAROSCOPY, DIAGNOSTIC N/A 11/09/2022    Procedure: LAPAROSCOPY, DIAGNOSTIC;  Surgeon: Madison Sedalia MATSU, MD;  Location: [facility];  Service: Gynecology;  Laterality: N/A;  [id number]    UMBILICAL HERNIA REPAIR  2016    UPPER GASTROINTESTINAL ENDOSCOPY  2009    UPPER GASTROINTESTINAL ENDOSCOPY  2009    WISDOM TOOTH EXTRACTION  1998   [2]   Family History  Adopted: Yes    [3]   Social History  Socioeconomic History    Marital status: Married    Number of children: 0   Occupational History    Occupation: Pensions consultant    Tobacco Use    Smoking status: Never     Passive exposure: Never    Smokeless tobacco: Never   Vaping Use    Vaping status: Never Used   Substance and Sexual Activity    Alcohol use: Never    Drug use: Never    Sexual activity: Yes     Partners: Male     Birth control/protection: Surgical     Comment: Hysterectomy   Social History Narrative    Diet: Non specific         Caffeine per day: tea 1 cup         ACD: N        Medical POA: N        DNR: N        Exercise: N     Social Drivers of Psychologist, prison and probation services Strain: Low Risk  (05/15/2024)    Overall Financial Resource Strain (CARDIA)     Difficulty of Paying Living Expenses: Not very hard   Food Insecurity: No Food Insecurity (05/15/2024)    Hunger Vital Sign     Worried About Running Out of Food in the Last Year: Never true     Ran Out of Food in the Last Year: Never true   Transportation Needs: No Transportation Needs (05/15/2024)    PRAPARE - Therapist, art (Medical): No     Lack of Transportation (Non-Medical): No   Physical Activity: Inactive (05/15/2024)    Exercise Vital Sign     Days of Exercise per Week: 0 days     Minutes of Exercise per Session: 0 min   Stress: Stress Concern Present (05/15/2024)    [employer] of Occupational Health - Occupational Stress Questionnaire     Feeling of Stress : Rather much   Social Connections: Socially Integrated (05/15/2024)    Social Connection and Isolation Panel     Frequency of Communication with Friends and Family: Twice a week     Frequency of Social Gatherings with Friends and Family: Once a week     Attends Religious Services: 1 to 4 times per year  Active Member of Clubs or Organizations: Yes     Attends Banker Meetings: 1 to 4 times per year     Marital Status: Married   Catering manager Violence: Not At Risk (05/15/2024)     Humiliation, Afraid, Rape, and Kick questionnaire     Fear of Current or Ex-Partner: No     Emotionally Abused: No     Physically Abused: No     Sexually Abused: No   Housing Stability: Low Risk  (05/15/2024)    Housing Stability Vital Sign     Unable to Pay for Housing in the Last Year: No     Number of Times Moved in the Last Year: 0     Homeless in the Last Year: No   [4]   Allergies  Allergen Reactions    Fruit Blend Flavor [Flavoring Agent (Non-Screening)] Anaphylaxis and Swelling     Tropical Fruit     Latex Drug-Induced Flushing, Hives, Other (See Comments), Swelling, Itching and Rash    Cyanoacrylate Itching and Rash    Other Itching and Rash     Sutures Monocryl    Oxycodone Hallucinations, Hives and Other (See Comments)     Hallucinations, took 1 time 25 years ago    Wound Dressing Adhesive Itching and Other (See Comments)     adhesive   [5]   Current Outpatient Medications:     eletriptan  (RELPAX ) 40 MG tablet, Take 1 tablet (40 mg) by mouth as needed for Migraine .  Wait at least 2 hours prior to taking second dose.  Max 2 tabs/24 hours. Do not use more than 3 days per week., Disp: 12 tablet, Rfl: 5    EPINEPHrine  (EPIPEN  2-PAK) 0.3 MG/0.3ML Solution Auto-injector injection, Inject 0.3 mLs (0.3 mg) into the muscle once as needed (Anaphylaxis), Disp: 2 each, Rfl: 2    folic acid  (FOLVITE ) 1 MG tablet, Take 1 tablet (1,000 mcg) by mouth once daily, Disp: 90 tablet, Rfl: 1    hydrOXYzine  (ATARAX ) 10 MG tablet, TAKE 1 TABLET BY MOUTH EVERY DAY NIGHTLY, Disp: 90 tablet, Rfl: 3    inFLIXimab  100 MG injection, Infuse into the vein once every eight weeks, Disp: , Rfl:     Lacosamide  (VIMPAT ) 200 MG Tablet, Take 1 tablet (200 mg) by mouth 2 (two) times daily, Disp: 180 tablet, Rfl: 1    lidocaine -prilocaine (EMLA) cream, Apply topically as needed Dermatology procedures, Disp: , Rfl:     Melatonin-Pyridoxine (MELATIN PO), Take 4 mg by mouth nightly, Disp: , Rfl:     methocarbamol  (ROBAXIN ) 750 MG tablet, Take 1  tablet (750 mg) by mouth 4 (four) times daily, Disp: 30 tablet, Rfl: 2    methotrexate  2.5 MG tablet, Take 4 tablets (10 mg) by mouth once a week, Disp: 48 tablet, Rfl: 1    Multiple Vitamin (MULTIVITAMIN PO), Take by mouth daily, Disp: , Rfl:     Naltrexone HCl, Pain, (Naltrex) 1.5 MG Cap, Take by mouth, Disp: , Rfl:     omeprazole  (PriLOSEC) 40 MG capsule, TAKE 1 CAPSULE BY MOUTH EVERY DAY, Disp: 90 capsule, Rfl: 1    ondansetron  (ZOFRAN -ODT) 8 MG disintegrating tablet, Dissolve 1 tablet (8 mg) in the mouth every 8 (eight) hours as needed for Nausea, Disp: 90 tablet, Rfl: 3    predniSONE  (DELTASONE ) 10 MG tablet, Take 1 tablet (10 mg) by mouth once daily, Disp: 30 tablet, Rfl: 5    Rimegepant Sulfate  75 MG Tablet Dispersible, TAKE 1 TABLET (75  MG) BY MOUTH AS NEEDED (MIGRAINE), Disp: 16 each, Rfl: 11    valACYclovir  (VALTREX ) 1000 MG tablet, Take 2 tabs at onset; repeat once in 12 hours.  Total 4 tabs per outbreak, Disp: 12 tablet, Rfl: 3    vitamin B-12 1000 MCG tablet, Take 1 tablet (1,000 mcg) by mouth daily, Disp: 30 tablet, Rfl: 0    VITAMIN D  PO, Take 1 tablet by mouth daily Dosage unsure, Disp: , Rfl:     Apremilast  (Otezla ) 30 MG Tablet, Take 1 tablet (30 mg) by mouth 2 (two) times daily, Disp: 180 tablet, Rfl: 1    Atogepant  60 MG Tablet, TAKE 1 TABLET (60 MG) BY MOUTH NIGHTLY FOR MIGRAINE PREVENTION, Disp: 30 each, Rfl: 11    atomoxetine  (STRATTERA ) 100 MG capsule, Take 1 capsule (100 mg) by mouth every morning, Disp: 90 capsule, Rfl: 3    botulinum toxin type A  (BOTOX ) 200 units injection, INJECT 155 UNITS INTRAMUSCULARLY EVERY 12 WEEKS, Disp: 1 each, Rfl: 3    busPIRone  (BUSPAR ) 5 MG tablet, Take 1 tablet (5 mg) by mouth 2 (two) times daily, Disp: 180 tablet, Rfl: 3    clindamycin  (CLEOCIN  T) 1 % lotion, Apply topically every morning, Disp: , Rfl:     colesevelam  (WELCHOL ) 625 MG tablet, TAKE 2 TABLETS (1,250 MG) BY MOUTH EVERY MORNING, Disp: 180 tablet, Rfl: 3    Dapsone 7.5 % Gel, Apply topically  every morning, Disp: , Rfl:     diazePAM  (VALIUM ) 5 MG tablet, Take 1 tablet (5 mg) by mouth every 6 (six) hours as needed for Anxiety (take 1/2 hour prior to the MRI), Disp: 5 tablet, Rfl: 0    dicyclomine  (BENTYL ) 20 MG tablet, TAKE 1 TABLET (20 MG) BY MOUTH EVERY 6 (SIX) HOURS, Disp: 360 tablet, Rfl: 3    divalproex , ER, extended release (DEPAKOTE  ER) 500 MG 24 hr tablet, Take 1 tablet (500 mg) by mouth 3 (three) times daily, Disp: 270 tablet, Rfl: 3    dorzolamide-timolol (COSOPT) 2-0.5 % ophthalmic solution, , Disp: , Rfl:     Current Facility-Administered Medications:     botulinum toxin type A  (BOTOX ) injection 155 Units, 155 Units, Intramuscular, Q12 Weeks, Koutsandreas, Allison K, FNP, 175 Units at 03/19/24 1200    onabotulinumtoxin A (BOTOX ) injection 175 Units, 175 Units, Intramuscular, Q12 Weeks, Kastl, Charlotte M, MD, 175 Units at 08/16/23 1013

## 2024-05-22 NOTE — Patient Instructions (Addendum)
 You were seen today for your symptoms.  The swelling under your tongue is most consistent with inflammation related to a likely salivary gland infection.  Your chest x-ray showed pneumonia in your right lower lung.  You were started on 2 antibiotics, Augmentin  and azithromycin , please take as prescribed and complete the entire course.  Please also take probiotics while on antibiotics to help support your body's good bacteria.    Rest and stay well hydrated. For fever and/or muscle ache: take ibuprofen  600 mg every 8 hours and/or Tylenol  650mg  every 6 hours. Maximum of 3000mg  of Tylenol  in 24 hours. Cough is usually the last symptom to resolve and can linger up to a few weeks after a viral infection. Mucinex  DM or Robutussin DM if cough and mucus. Cough drops to sooth throat and encourage swallowing.  Rest your body/rest your voice. Talking/heavy breathing can trigger cough.    For sore throat, honey/tea and throat lozenges can be helful. Additionally, warm salt water  gurgles 3-4x times a day. Please gurgle with this and spit it out. If you have congestion, nasal steroid like Flonase or Nasacort, and frequent saline sinus rinse like Neti-pot.  NO sudafed if you have high blood pressure or heart problems. Coricidin HBP is a safe cough/cold medication for patients who have high blood pressure. Use humidifier, steam, sleep with head raised/recliner. Vitamin C 500mg  twice daily can also be helpful to support your immune system.     To limit the spread of your symptoms to others you should wash your hands frequently and keep surfaces in your home clean.    Please follow up with your primary care provider in 2-3 days.  Referral was also placed for you to follow-up with a new ear nose and throat specialist, you may also follow-up with your dentist.  Please call tomorrow to schedule an appointment as soon as possible. If your symptoms worsen, you are experiencing new high fevers, shaking chills, trouble breathing or  swallowing, increased or changes with sputum, changes in vision such as blurry vision, neck stiffness, confusion, or any other concerns, please return to the urgent care or present to the emergency department.      ENT    [facility]   Ozell Hof, MD   [address] [place] [facility]) [phone]       [facility] [place]   Xenia Catalina, MD   [address] [place], [place] [address] [phone]  Charlie Idol, MD   [address] [place], [place] [address]   Sidra Croissant, MD   [address] [place], [place] [address]        [facility]   Evalene Buss, MD   [address] [place], [place] [address] [phone]  Mavis Grana, MD         [facility]   Aliene Ruth, MD   [address] [place], [place] [address] [phone]  Toribio Maillard, MD   [address] [place] [address]   Nat Draft, MD   [phone] [facility] [place], [place] [address]

## 2024-05-22 NOTE — Patient Instructions (Signed)
-   Discussed that symptoms possible adverse effects from medication and consider trialing off after discussion with prescribing provider.  - Recommend 4 hour gastric emptying scan   - Will need to be off of naltrexone for at least 72 hours prior to the test (rec 1 week if possible)  - NPO 8 hours before the test   - Continue Zofran  as needed, use sparingly     May continue:  - Benefiber powder twice daily   - Miralax powder twice daily  - Tums-GasX or famotidine  20 to 40 mg as rescue medications if needed  - Omeprazole  40 mg 30 min before breakfast  - Small volume frequent meals daily  - Reconsideration of colonoscopy for CRC screening in 03/2028 (5-year repeat), given immunosupp, adopted w/o known fam hx.

## 2024-05-22 NOTE — Addendum Note (Signed)
 Addended by: LEANDRO ANDREA HERO. on: 05/22/2024 10:54 AM     Modules accepted: Orders

## 2024-05-23 ENCOUNTER — Ambulatory Visit: Payer: BLUE CROSS/BLUE SHIELD

## 2024-05-23 ENCOUNTER — Other Ambulatory Visit: Payer: Self-pay

## 2024-05-23 DIAGNOSIS — G43709 Chronic migraine without aura, not intractable, without status migrainosus: Secondary | ICD-10-CM

## 2024-05-23 NOTE — Progress Notes (Signed)
 Patient messaged the following:    Hi Dr. Woodard - I went to urgent care today due to an inflamed salivary gland under my tongue and back of throat, and sore throat, and productive cough. Urgent care did a chest X-ray and diagnosed pneumonia and started me on Augmentin  and Amoxicillin . I'm scheduled for my Infliximab  infusion next Tuesday. Am I ok to receive the infusion or does it need to be pushed back?     Thanks,     Hailey [name]      Background: patient with behcets complicated with neurologic involvement, uveitis, inflamamtory joint pain, on remicade , otezla , low dose MTX.  Currently on antibiotics for PNA    Recommendations:  -Continue antibiotics per urgent care directions  -I recommend delaying next Remicade  infusion until at least one week from diagnosis, later this week.  Would not get remicade  until abx are finished and you remain afebrile and cough and respiratory symptoms improving      Based on cumulative time spent addressing messages for an established patient up to 7 days, online digital evaluation and management service for 5 minutes has been performed.

## 2024-05-24 ENCOUNTER — Ambulatory Visit (INDEPENDENT_AMBULATORY_CARE_PROVIDER_SITE_OTHER): Payer: Self-pay | Admitting: Family Medicine

## 2024-05-24 MED ORDER — RIMEGEPANT SULFATE 75 MG PO TBDP
ORAL_TABLET | ORAL | 11 refills | Status: DC
  Filled 2024-06-03: qty 16, fill #0
  Filled 2024-06-08: qty 16, 30d supply, fill #0
  Filled 2024-07-03: qty 16, 30d supply, fill #1
  Filled 2024-08-02: qty 16, 30d supply, fill #2
  Filled 2024-08-28: qty 16, 30d supply, fill #3
  Filled 2024-09-29: qty 16, 30d supply, fill #4
  Filled 2024-10-29: qty 16, 30d supply, fill #5
  Filled 2024-11-21: qty 16, 30d supply, fill #6

## 2024-05-27 ENCOUNTER — Encounter: Payer: Self-pay | Admitting: Neurology

## 2024-05-27 ENCOUNTER — Encounter: Payer: Self-pay | Admitting: Internal Medicine

## 2024-05-27 ENCOUNTER — Ambulatory Visit: Payer: BLUE CROSS/BLUE SHIELD | Admitting: Neurology

## 2024-05-28 ENCOUNTER — Other Ambulatory Visit: Payer: Self-pay

## 2024-05-28 ENCOUNTER — Other Ambulatory Visit (INDEPENDENT_AMBULATORY_CARE_PROVIDER_SITE_OTHER): Payer: BLUE CROSS/BLUE SHIELD

## 2024-05-28 ENCOUNTER — Ambulatory Visit: Payer: BLUE CROSS/BLUE SHIELD

## 2024-05-28 ENCOUNTER — Other Ambulatory Visit: Payer: Self-pay | Admitting: Family Nurse Practitioner

## 2024-05-28 DIAGNOSIS — G43709 Chronic migraine without aura, not intractable, without status migrainosus: Secondary | ICD-10-CM

## 2024-05-28 NOTE — Progress Notes (Signed)
 Specialty Pharmacy Refill Note    Beverly Ferner is a 43 y.o. female, who is being followed by [facility] team for management of: RxSp Migraine (Enrolled) for the following services:  Refill Management  Benefits and PA Management    Medications monitored by Specialty Pharmacy:   Atogepant  60 MG Tablet   TAKE 1 TABLET (60 MG) BY MOUTH NIGHTLY FOR MIGRAINE PREVENTION    botulinum toxin type A  (BOTOX ) 200 units injection   INJECT 155 UNITS INTRAMUSCULARLY EVERY 12 WEEKS    Rimegepant Sulfate  75 MG Tablet Dispersible   TAKE 1 TABLET (75 MG) BY MOUTH AS NEEDED (MIGRAINE)      Refill Coordination  Medications Linked to Program: Atogepant   HIPAA verified (patient name & dob or patient name & address) with approved contact: Yes  HIPAA verification: please specify: name, dob  How has _______ (specialty medication) helped you manage your _______ (condition) from very well to very poor: Well  Changes to allergies?: No  Changes to medications, herbals or supplements?: No  New conditions (diagnosis) since last pharmacy outreach: No  Since the last fill, has the patient experienced any unplanned office visit, urgent care, emergency room, or hospital admission: No  Since the last fill, any new or worsened side effects?: No  Financial problems or insurance changes : No  Since the last fill, has the patient missed any doses of their specialty medication : No  Since your last fill, how many doses of your specialty medications were missed?: 0  Doses left of specialty medications: 5  Does the patient have the correct number of remaining doses: Yes  Patient confirmations: received welcome packet, patient bill of rights, & privacy practices: Patient has received all  Patient confirmation: patient previously received the drug monograph(s) for their specialty medication(s): Yes    Delivery Information  Delivery confirmation: signature required for delivery: No  Delivery method: FedEx  Delivery confirmation:  delivery address: Delivery Address Reviewed & Accurate  Enter delivery address: [address] [name] [address]  Delivery phone number: [phone]  Delivery confirmation: patient informed of and confirms delivery date. Enter Delivery date: : 05/30/24  Preferred time?: Anytime  Number of medications in delivery: 1  Is there any medication that is due not being filled?: No  Supplies needed?: No supplies needed  Does patient have any concerns about safely storing medications (at the correct temperature, away from children/pets,etc.)?: No  Do any medications need mixed or dated?: No  Financial confirmation: patient informed of financial responsibility and copay amount: Yes  Financial responsibility: copay amount: 0  Copay form of payment: Credit card on file  Questions or concerns for the pharmacist?: No  Are any medications first time fills?: No  Additional assessment notes : pt recently diagnosed with pneumonia, currently on antibiotic meds        Medicare Part B Fill? No    Micha Erck

## 2024-05-29 ENCOUNTER — Other Ambulatory Visit: Payer: Self-pay

## 2024-05-29 ENCOUNTER — Ambulatory Visit: Payer: BLUE CROSS/BLUE SHIELD

## 2024-05-29 NOTE — Progress Notes (Signed)
 Specialty Pharmacy Refill Note    Hailey [name] is a 43 y.o. female, who is being followed by [facility] team for management of: RxSp Migraine (Enrolled) for the following services:  Refill Management  Benefits and PA Management    Medications monitored by Specialty Pharmacy:   Atogepant  60 MG Tablet   TAKE 1 TABLET (60 MG) BY MOUTH NIGHTLY FOR MIGRAINE PREVENTION    botulinum toxin type A  (BOTOX ) 200 units injection   INJECT 155 UNITS INTRAMUSCULARLY EVERY 12 WEEKS    Rimegepant Sulfate  75 MG Tablet Dispersible   TAKE 1 TABLET (75 MG) BY MOUTH AS NEEDED (MIGRAINE)      Refill Coordination  Medications Linked to Program: OnabotulinumtoxinA  (BOTOX )  HIPAA verified (patient name & dob or patient name & address) with approved contact: Yes  HIPAA verification: please specify: auto refill  How has _______ (specialty medication) helped you manage your _______ (condition) from very well to very poor: Ok or neutral  Does the patient have the correct number of remaining doses: Yes  Patient confirmations: received welcome packet, patient bill of rights, & privacy practices: Patient has received all  Patient confirmation: patient previously received the drug monograph(s) for their specialty medication(s): Yes    Delivery Information  Delivery confirmation: signature required for delivery: No  Delivery method: Deliver to Clinic (Comment)  Delivery confirmation: delivery address: Delivery Address Reviewed & Accurate  Enter delivery address: [address] [name] [address]  Delivery phone number: [phone]  Delivery confirmation: patient informed of and confirms delivery date. Enter Delivery date: : 06/07/24  Preferred time?: Anytime  Number of medications in delivery: 1  Is there any medication that is due not being filled?: No  Supplies needed?: No supplies needed  Does patient have any concerns about safely storing medications (at the correct temperature, away from children/pets,etc.)?: No  Do any  medications need mixed or dated?: No  Financial confirmation: patient informed of financial responsibility and copay amount: Yes  Financial responsibility: copay amount: 0.00  Copay form of payment: Credit card on file  Questions or concerns for the pharmacist?: No  Are any medications first time fills?: No  Additional assessment notes : 1/84, $0.00        Medicare Part B Fill? No    Hailey [name]

## 2024-05-30 ENCOUNTER — Other Ambulatory Visit (INDEPENDENT_AMBULATORY_CARE_PROVIDER_SITE_OTHER): Payer: Self-pay | Admitting: Physician Assistant

## 2024-05-30 ENCOUNTER — Other Ambulatory Visit: Payer: Self-pay

## 2024-05-30 ENCOUNTER — Ambulatory Visit
Admission: RE | Admit: 2024-05-30 | Discharge: 2024-05-30 | Disposition: A | Discharge status: Home / Self Care | Payer: BLUE CROSS/BLUE SHIELD | Source: Ambulatory Visit | Attending: Physician Assistant | Admitting: Physician Assistant

## 2024-05-30 ENCOUNTER — Ambulatory Visit: Payer: BLUE CROSS/BLUE SHIELD

## 2024-05-30 ENCOUNTER — Ambulatory Visit (INDEPENDENT_AMBULATORY_CARE_PROVIDER_SITE_OTHER): Payer: BLUE CROSS/BLUE SHIELD | Admitting: Physician Assistant

## 2024-05-30 VITALS — BP 119/85 | HR 98 | Temp 98.2°F | Wt 310.0 lb

## 2024-05-30 DIAGNOSIS — J189 Pneumonia, unspecified organism: Secondary | ICD-10-CM | POA: Insufficient documentation

## 2024-05-30 MED ORDER — ALBUTEROL SULFATE HFA 108 (90 BASE) MCG/ACT IN AERS
2.0000 | INHALATION_SPRAY | RESPIRATORY_TRACT | 0 refills | Status: DC | PRN
Start: 2024-05-30 — End: 2024-08-29

## 2024-05-30 NOTE — Progress Notes (Deleted)
 Subjective:      Patient ID: Hailey [name]  is a 43 y.o.  female.     Chief Complaint   Patient presents with    Follow-up     Follow up UC dx.pneumonia of right lower lobe of lung  C/o sob       HPI  ***   The following sections were reviewed this encounter by the provider:       Review of Systems      BP 119/85 (BP Site: Left arm, Patient Position: Sitting, Cuff Size: Large)   Pulse (!) 119   Temp 98.2 F (36.8 C) (Oral)   Wt (!) 140.6 kg (310 lb)   LMP  (LMP Unknown)   SpO2 96%   BMI 47.14 kg/m     Objective:   Physical Exam     Assessment:   There are no diagnoses linked to this encounter.       Plan:   ***  Risk & Benefits of any new medication(s) were explained to the patient who verbalized understanding & agreed to the treatment plan.     Call if symptoms persist, worsen, or change.  Call with updates/questions/concerns.    Tinnie Drucella RIGGERS, MPH

## 2024-05-30 NOTE — Progress Notes (Signed)
 [facility] [place]           Subjective     History of Present Illness      Review of Systems    Objective     BP 119/85 (BP Site: Left arm, Patient Position: Sitting, Cuff Size: Large)   Pulse (!) 119   Temp 98.2 F (36.8 C) (Oral)   Wt (!) 140.6 kg (310 lb)   LMP  (LMP Unknown)   SpO2 96%   BMI 47.14 kg/m   Physical Exam  Physical Exam       Results          Assessment/Plan     Assessment & Plan      Verbal consent obtained to record this visit.

## 2024-05-30 NOTE — Progress Notes (Signed)
 [facility] [place]           Subjective     History of Present Illness  Hailey [name] is a 43 year old female who presents with shortness of breath and pneumonia.    She initially sought care for a lump under her tongue, associated with a history of salivary gland issues. At urgent care on 05/24/24  where a chest x-ray revealed pneumonia. She reports that she was prescribed azithromycin  and another antibiotic at urgent care, which she completed today, but reports only a slight improvement in her symptoms.    Feels SOB  making it challenging to climb stairs at home. She experienced a sore throat last week, which has improved, but the salivary gland under her tongue remains swollen and is more noticeable when swallowing. She has not yet seen an ENT specialist for this issue.     She has a persistent low-grade fever, with a recent reading of 100.32F last night, but afebrile today  Reports a reduction in the frequency of her cough, though she still occasionally coughs up sputum.    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 She denies any history of pneumonia in the past, nor blood clots and is not on birth control due to a prior hysterectomy. She is a non smoker and has had no prolonged air or car travel recently     Review of Systems   Constitutional:  Positive for fatigue. Negative for chills and diaphoresis.   Respiratory:  Positive for cough, chest tightness and shortness of breath.    Cardiovascular:  Negative for leg swelling.   Skin:  Negative for color change.       Objective     BP 119/85 (BP Site: Left arm, Patient Position: Sitting, Cuff Size: Large)   Pulse 98   Temp 98.2 F (36.8 C) (Oral)   Wt (!) 140.6 kg (310 lb)   LMP  (LMP Unknown)   SpO2 96%   BMI 47.14 kg/m   Physical Exam  Constitutional:       General: She is not in acute distress.     Appearance: Normal appearance. She is obese. She is not toxic-appearing.   Cardiovascular:      Rate and  Rhythm: Normal rate and regular rhythm.   Pulmonary:      Effort: Pulmonary effort is normal. No respiratory distress.      Breath sounds: Normal breath sounds. No wheezing, rhonchi or rales.   Neurological:      General: No focal deficit present.      Mental Status: She is alert.   Psychiatric:         Mood and Affect: Mood normal.         Behavior: Behavior normal.       Physical Exam         Results  RADIOLOGY  Chest X-ray: Pneumonia (05/22/2022)        Assessment/Plan     1. Pneumonia of right lower lobe due to infectious organism  CT Chest WO Contrast    XR Chest 2 Views    albuterol  sulfate HFA (PROVENTIL ) 108 (90 Base) MCG/ACT inhaler          Assessment & Plan  Pneumonia  Recent diagnosis confirmed by chest x-ray. Persistent symptoms despite antibiotics.  - Attempted to order CT scan but unable to get insurance authorization for exam tomorrow. will order CXR instead  Continue antibiotics  Shortness of breath  Will prescribe Albuterol  inhaler to start    Chronic use of prednisone   Chronic use at 10 mg daily  Consider increasing dose for short period of time      Verbal consent obtained to record this visit.

## 2024-05-31 ENCOUNTER — Encounter (INDEPENDENT_AMBULATORY_CARE_PROVIDER_SITE_OTHER): Payer: Self-pay | Admitting: Physician Assistant

## 2024-05-31 NOTE — Progress Notes (Unsigned)
 Holding atomoxetine  d/t nausea. Energy /concentraiton are ok

## 2024-06-01 ENCOUNTER — Encounter: Payer: Self-pay | Admitting: Internal Medicine

## 2024-06-01 DIAGNOSIS — D84821 Immunodeficiency due to drugs: Secondary | ICD-10-CM

## 2024-06-01 DIAGNOSIS — H209 Unspecified iridocyclitis: Secondary | ICD-10-CM

## 2024-06-01 DIAGNOSIS — M0600 Rheumatoid arthritis without rheumatoid factor, unspecified site: Secondary | ICD-10-CM

## 2024-06-01 DIAGNOSIS — R569 Unspecified convulsions: Secondary | ICD-10-CM

## 2024-06-01 DIAGNOSIS — M352 Behcet's disease: Secondary | ICD-10-CM

## 2024-06-03 ENCOUNTER — Other Ambulatory Visit: Payer: Self-pay | Admitting: Neurology

## 2024-06-03 ENCOUNTER — Other Ambulatory Visit: Payer: Self-pay

## 2024-06-03 ENCOUNTER — Ambulatory Visit
Admission: RE | Admit: 2024-06-03 | Discharge: 2024-06-03 | Disposition: A | Discharge status: Home / Self Care | Payer: BLUE CROSS/BLUE SHIELD | Source: Ambulatory Visit | Attending: Neurology | Admitting: Neurology

## 2024-06-03 ENCOUNTER — Ambulatory Visit: Payer: BLUE CROSS/BLUE SHIELD | Admitting: Neurology

## 2024-06-03 DIAGNOSIS — M352 Behcet's disease: Secondary | ICD-10-CM | POA: Insufficient documentation

## 2024-06-03 DIAGNOSIS — R569 Unspecified convulsions: Secondary | ICD-10-CM | POA: Insufficient documentation

## 2024-06-03 DIAGNOSIS — M542 Cervicalgia: Secondary | ICD-10-CM | POA: Insufficient documentation

## 2024-06-03 MED ORDER — GADOBUTROL 1 MMOL/ML IV SOSY (WRAP)
10.0000 mL | Freq: Once | INTRAVENOUS | Status: AC | PRN
Start: 2024-06-03 — End: 2024-06-03
  Administered 2024-06-03: 10 mL via INTRAVENOUS
  Filled 2024-06-03: qty 10

## 2024-06-04 ENCOUNTER — Encounter: Payer: Self-pay | Admitting: Neurology

## 2024-06-04 ENCOUNTER — Encounter (INDEPENDENT_AMBULATORY_CARE_PROVIDER_SITE_OTHER): Payer: Self-pay | Admitting: Internal Medicine

## 2024-06-04 ENCOUNTER — Other Ambulatory Visit: Payer: Self-pay | Admitting: Neurology

## 2024-06-04 ENCOUNTER — Ambulatory Visit
Admission: RE | Admit: 2024-06-04 | Discharge: 2024-06-04 | Disposition: A | Discharge status: Home / Self Care | Payer: BLUE CROSS/BLUE SHIELD | Source: Ambulatory Visit | Attending: Neurology | Admitting: Neurology

## 2024-06-04 ENCOUNTER — Encounter: Payer: Self-pay | Admitting: No Specialty

## 2024-06-04 ENCOUNTER — Other Ambulatory Visit: Payer: Self-pay

## 2024-06-04 DIAGNOSIS — R569 Unspecified convulsions: Secondary | ICD-10-CM | POA: Insufficient documentation

## 2024-06-04 DIAGNOSIS — M352 Behcet's disease: Secondary | ICD-10-CM | POA: Insufficient documentation

## 2024-06-04 DIAGNOSIS — M542 Cervicalgia: Secondary | ICD-10-CM | POA: Insufficient documentation

## 2024-06-04 MED ORDER — GADOBUTROL 1 MMOL/ML IV SOSY (WRAP)
10.0000 mL | Freq: Once | INTRAVENOUS | Status: AC | PRN
Start: 2024-06-04 — End: 2024-06-04
  Administered 2024-06-04: 10 mL via INTRAVENOUS
  Filled 2024-06-04: qty 10

## 2024-06-05 ENCOUNTER — Encounter (INDEPENDENT_AMBULATORY_CARE_PROVIDER_SITE_OTHER): Payer: Self-pay

## 2024-06-05 ENCOUNTER — Other Ambulatory Visit (INDEPENDENT_AMBULATORY_CARE_PROVIDER_SITE_OTHER): Payer: BLUE CROSS/BLUE SHIELD

## 2024-06-05 ENCOUNTER — Ambulatory Visit: Payer: BLUE CROSS/BLUE SHIELD | Attending: Anatomic and Clinical Pathology

## 2024-06-05 ENCOUNTER — Other Ambulatory Visit: Payer: Self-pay

## 2024-06-05 VITALS — BP 122/78 | HR 80 | Temp 97.5°F | Wt 307.8 lb

## 2024-06-05 DIAGNOSIS — M352 Behcet's disease: Secondary | ICD-10-CM | POA: Insufficient documentation

## 2024-06-05 DIAGNOSIS — M0600 Rheumatoid arthritis without rheumatoid factor, unspecified site: Secondary | ICD-10-CM

## 2024-06-05 DIAGNOSIS — R569 Unspecified convulsions: Secondary | ICD-10-CM | POA: Insufficient documentation

## 2024-06-05 DIAGNOSIS — H209 Unspecified iridocyclitis: Secondary | ICD-10-CM

## 2024-06-05 DIAGNOSIS — D84821 Immunodeficiency due to drugs: Secondary | ICD-10-CM

## 2024-06-05 LAB — LAB USE ONLY - CBC WITH DIFFERENTIAL
Absolute Basophils: 0.03 x10 3/uL (ref 0.00–0.08)
Absolute Eosinophils: 0.07 x10 3/uL (ref 0.00–0.44)
Absolute Immature Granulocytes: 0.03 x10 3/uL (ref 0.00–0.07)
Absolute Lymphocytes: 5.03 x10 3/uL — ABNORMAL HIGH (ref 0.42–3.22)
Absolute Monocytes: 0.58 x10 3/uL (ref 0.21–0.85)
Absolute Neutrophils: 4.86 x10 3/uL (ref 1.10–6.33)
Absolute nRBC: 0 x10 3/uL (ref <=0.00)
Basophils %: 0.3 %
Eosinophils %: 0.7 %
Hematocrit: 42.2 % (ref 34.7–43.7)
Hemoglobin: 14.4 g/dL (ref 11.4–14.8)
Immature Granulocytes %: 0.3 %
Lymphocytes %: 47.5 %
MCH: 33.2 pg (ref 25.1–33.5)
MCHC: 34.1 g/dL (ref 31.5–35.8)
MCV: 97.2 fL — ABNORMAL HIGH (ref 78.0–96.0)
MPV: 8.4 fL — ABNORMAL LOW (ref 8.9–12.5)
Monocytes %: 5.5 %
Neutrophils %: 45.7 %
Platelet Count: 305 x10 3/uL (ref 142–346)
Preliminary Absolute Neutrophil Count: 4.86 x10 3/uL (ref 1.10–6.33)
RBC: 4.34 x10 6/uL (ref 3.90–5.10)
RDW: 12 % (ref 11–15)
WBC: 10.6 x10 3/uL — ABNORMAL HIGH (ref 3.10–9.50)
nRBC %: 0 /100{WBCs} (ref <=0.0)

## 2024-06-05 LAB — COMPREHENSIVE METABOLIC PANEL
ALT: 50 U/L (ref <=55)
AST (SGOT): 30 U/L (ref <=41)
Albumin/Globulin Ratio: 1 (ref 0.9–2.2)
Albumin: 3.6 g/dL (ref 3.5–5.0)
Alkaline Phosphatase: 48 U/L (ref 37–117)
Anion Gap: 10 (ref 5.0–15.0)
BUN: 8 mg/dL (ref 7–21)
Bilirubin, Total: 0.4 mg/dL (ref 0.2–1.2)
CO2: 25 meq/L (ref 17–29)
Calcium: 9.5 mg/dL (ref 8.5–10.5)
Chloride: 107 meq/L (ref 99–111)
Creatinine: 0.7 mg/dL (ref 0.4–1.0)
GFR: >60 mL/min/1.73 m2 (ref >=60.0)
Globulin: 3.6 g/dL (ref 2.0–3.6)
Glucose: 84 mg/dL (ref 70–100)
Hemolysis Index: 12 {index}
Potassium: 3.8 meq/L (ref 3.5–5.3)
Protein, Total: 7.2 g/dL (ref 6.0–8.3)
Sodium: 142 meq/L (ref 135–145)

## 2024-06-05 LAB — SEDIMENTATION RATE: Sed Rate: 8 mm/h (ref <=20)

## 2024-06-05 LAB — C-REACTIVE PROTEIN: C-Reactive Protein: <0.1 mg/dL (ref 0.0–1.1)

## 2024-06-05 MED ORDER — SODIUM CHLORIDE 0.9 % IV SOLN
250.0000 mL | INTRAVENOUS | Status: DC
Start: 2024-06-05 — End: 2024-06-05
  Administered 2024-06-05: 250 mL via INTRAVENOUS
  Filled 2024-06-05: qty 250

## 2024-06-05 MED ORDER — ACETAMINOPHEN 325 MG PO TABS
650.0000 mg | ORAL_TABLET | Freq: Once | ORAL | Status: AC
Start: 2024-06-05 — End: 2024-06-05
  Administered 2024-06-05: 650 mg via ORAL
  Filled 2024-06-05: qty 2

## 2024-06-05 MED ORDER — METHYLPREDNISOLONE SODIUM SUCC 40 MG IJ SOLR (WRAP)
40.0000 mg | Freq: Once | INTRAMUSCULAR | Status: AC
Start: 2024-06-05 — End: 2024-06-05
  Administered 2024-06-05: 40 mg via INTRAVENOUS
  Filled 2024-06-05: qty 1

## 2024-06-05 MED ORDER — DIPHENHYDRAMINE HCL 50 MG/ML IJ SOLN
25.0000 mg | Freq: Once | INTRAMUSCULAR | Status: AC
Start: 2024-06-05 — End: 2024-06-05
  Administered 2024-06-05: 25 mg via INTRAVENOUS
  Filled 2024-06-05: qty 1

## 2024-06-05 MED ORDER — SODIUM CHLORIDE 0.9 % IV SOLN
700.0000 mg | Freq: Once | INTRAVENOUS | Status: AC
Start: 2024-06-05 — End: 2024-06-05
  Administered 2024-06-05: 700 mg via INTRAVENOUS
  Filled 2024-06-05: qty 700

## 2024-06-05 NOTE — Progress Notes (Signed)
 [facility] - Adult Infusion   Visit Date: 06/05/2024        Hailey [name] is a 43 y.o. female patient    Patient presents to the infusion center for treatment #11 Infliximab -axxq. Pt reports a manageable headache that comes and goes intermittently, baseline edema in Left lower leg, and pt ambulates w/ cane. Pt has no other issues or concerns.       Pre-Medications: acetaminophen  (Tylenol ) and diphenhydramine  (Benadryl ) and Solumedrol    Line Type: Peripheral IV:  Left arm  Blood Return verified prior to administration: Yes    Vital Signs:  Patient Vitals for the past 12 hrs:   BP Temp Pulse   06/05/24 1536 122/78 -- 80   06/05/24 1233 138/81 97.5 F (36.4 C) 98      Body surface area is 2.59 meters squared.    Administrations This Visit       0.9% NaCl infusion       Admin Date  06/05/2024  12:58 Action  New Bag Dose  250 mL Rate  20 mL/hr Route  Intravenous Ordering Provider  Woodard Geni HERO, MD              acetaminophen  (TYLENOL ) tablet 650 mg       Admin Date  06/05/2024  12:53 Action  Given Dose  650 mg Route  Oral Ordering Provider  Woodard Geni HERO, MD              diphenhydrAMINE  (BENADRYL ) injection 25 mg       Admin Date  06/05/2024  12:54 Action  Given Dose  25 mg Route  Intravenous Ordering Provider  Woodard Geni HERO, MD              inFLIXimab -axxq (AVSOLA ) 700 mg in sodium chloride  0.9 % 250 mL infusion       Admin Date  06/05/2024  13:33 Action  New Bag Dose  700 mg Rate  125 mL/hr Route  Intravenous Ordering Provider  Woodard Geni HERO, MD              methylPREDNISolone  sodium succinate (Solu-MEDROL ) injection 40 mg       Admin Date  06/05/2024  12:53 Action  Given Dose  40 mg Route  Intravenous Ordering Provider  Woodard Geni HERO, MD             Hypersensitivity Reaction Noted: No  IV Post Hydration: No, not required  Treatment Outcome:Patient tolerated treatment well. Peripheral Line flushed, +BBR and deacessed.    Education: Patient/Family educated regarding  the expected outcomes/side effects possible interactions of treatments. Patient verbalizes understanding.    Follow-up Plan: A return to the Infusion Center for the next treatment, has been scheduled.    RTC: 07/31/2024    Eva Cramp, RN    06/05/2024  4:02 PM

## 2024-06-06 ENCOUNTER — Other Ambulatory Visit: Payer: Self-pay

## 2024-06-06 LAB — VALPROIC ACID LEVEL, TOTAL AND FREE
Valproic Acid Free: 10.3 mg/L (ref 4.8–17.3)
Valproic Acid Level: 77.2 mg/L (ref 50.0–100.0)

## 2024-06-06 NOTE — Progress Notes (Signed)
 Specialty Pharmacy Refill Note    Hailey [name] is a 43 y.o. female, who is being followed by [facility] team for management of: RxSp Migraine (Enrolled) for the following services:  Refill Management  Benefits and PA Management    Medications monitored by Specialty Pharmacy:   Atogepant  60 MG Tablet   TAKE 1 TABLET (60 MG) BY MOUTH NIGHTLY FOR MIGRAINE PREVENTION    botulinum toxin type A  (BOTOX ) 200 units injection   INJECT 155 UNITS INTRAMUSCULARLY EVERY 12 WEEKS    Rimegepant Sulfate  75 MG Tablet Dispersible   TAKE 1 TABLET (75 MG) BY MOUTH AS NEEDED (MIGRAINE)      Refill Coordination  Medications Linked to Program: Rimegepant Sulfate   HIPAA verified (patient name & dob or patient name & address) with approved contact: Yes  How has _______ (specialty medication) helped you manage your _______ (condition) from very well to very poor: Well  Changes to allergies?: No  Changes to medications, herbals or supplements?: No  New conditions (diagnosis) since last pharmacy outreach: No  Since the last fill, has the patient experienced any unplanned office visit, urgent care, emergency room, or hospital admission: No  Since the last fill, any new or worsened side effects?: No  Financial problems or insurance changes : No  Since the last fill, has the patient missed any doses of their specialty medication : No  Since your last fill, how many doses of your specialty medications were missed?: 1  Why were doses missed?: Forgot  Doses left of specialty medications: Unable to assess  Does the patient have the correct number of remaining doses: Yes  Patient confirmations: received welcome packet, patient bill of rights, & privacy practices: Patient has received all  Patient confirmation: patient previously received the drug monograph(s) for their specialty medication(s): Yes    Delivery Information  Delivery confirmation: signature required for delivery: No  Delivery method: FedEx  Delivery  confirmation: delivery address: Delivery Address Reviewed & Accurate  Enter delivery address: [address] [name] [address]  Delivery phone number: [phone]  Delivery confirmation: patient informed of and confirms delivery date. Enter Delivery date: : 06/12/24  Preferred time?: Anytime  Number of medications in delivery: 1  Is there any medication that is due not being filled?: No  Supplies needed?: No supplies needed  Does patient have any concerns about safely storing medications (at the correct temperature, away from children/pets,etc.)?: No  Do any medications need mixed or dated?: No  Financial confirmation: patient informed of financial responsibility and copay amount: Yes  Financial responsibility: copay amount: 0.00  Copay form of payment: Credit card on file  Questions or concerns for the pharmacist?: No  Are any medications first time fills?: No  Additional assessment notes : 16/30, $0.00        Medicare Part B Fill? No    Hailey [name]

## 2024-06-07 ENCOUNTER — Other Ambulatory Visit: Payer: Self-pay

## 2024-06-07 NOTE — Progress Notes (Signed)
 Procedures:    Sacroiliac L SI Joint      Performed by: Zona Houston, MD  Authorized by: Zona Houston, MD      Consent Given by:  Patient  Site marked: the procedure site was marked    Procedure:  Sacroiliac  Indications:  Therapeutic benefit  Guidance:  Fluoroscopy  Site(s):  L SI Joint  Medication Administered: 1 mL Triamcinolone 40 MG/ML; 2 mL lidocaine  PF 1 %

## 2024-06-09 ENCOUNTER — Other Ambulatory Visit: Payer: Self-pay

## 2024-06-10 ENCOUNTER — Ambulatory Visit: Payer: BLUE CROSS/BLUE SHIELD | Attending: Neurology | Admitting: Neurology

## 2024-06-10 ENCOUNTER — Telehealth (INDEPENDENT_AMBULATORY_CARE_PROVIDER_SITE_OTHER): Payer: BLUE CROSS/BLUE SHIELD | Admitting: Internal Medicine

## 2024-06-10 ENCOUNTER — Other Ambulatory Visit: Payer: Self-pay

## 2024-06-10 ENCOUNTER — Encounter (INDEPENDENT_AMBULATORY_CARE_PROVIDER_SITE_OTHER): Payer: Self-pay | Admitting: Internal Medicine

## 2024-06-10 ENCOUNTER — Encounter: Payer: Self-pay | Admitting: Neurology

## 2024-06-10 VITALS — BP 125/85 | HR 96 | Temp 98.1°F | Resp 12 | Wt 307.0 lb

## 2024-06-10 DIAGNOSIS — H209 Unspecified iridocyclitis: Secondary | ICD-10-CM

## 2024-06-10 DIAGNOSIS — M352 Behcet's disease: Secondary | ICD-10-CM

## 2024-06-10 DIAGNOSIS — Z6841 Body Mass Index (BMI) 40.0 and over, adult: Secondary | ICD-10-CM

## 2024-06-10 DIAGNOSIS — M0600 Rheumatoid arthritis without rheumatoid factor, unspecified site: Secondary | ICD-10-CM

## 2024-06-10 DIAGNOSIS — R413 Other amnesia: Secondary | ICD-10-CM

## 2024-06-10 DIAGNOSIS — M25572 Pain in left ankle and joints of left foot: Secondary | ICD-10-CM

## 2024-06-10 DIAGNOSIS — R569 Unspecified convulsions: Secondary | ICD-10-CM

## 2024-06-10 DIAGNOSIS — Z79899 Other long term (current) drug therapy: Secondary | ICD-10-CM

## 2024-06-10 DIAGNOSIS — G43709 Chronic migraine without aura, not intractable, without status migrainosus: Secondary | ICD-10-CM

## 2024-06-10 DIAGNOSIS — G8929 Other chronic pain: Secondary | ICD-10-CM

## 2024-06-10 LAB — INFLIXIMAB LEVEL AND ANTI-DRUG ANTIBODY FOR IBD
Infliximab Antibody: 12 [AU] — ABNORMAL HIGH (ref <10)
Infliximab Level: 3.1 ug/mL

## 2024-06-10 MED ORDER — FOLIC ACID 1 MG PO TABS
1000.0000 ug | ORAL_TABLET | Freq: Every day | ORAL | 1 refills | Status: DC
Start: 2024-06-10 — End: 2024-09-10

## 2024-06-10 NOTE — Patient Instructions (Addendum)
 Dear Hailey [name] ,     Thanks for arranging a video visit with me.    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 We also reviewed your recent lab results and imaging studies.    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 Your current treatment with infliximab , methotrexate , and Otezla  will continue. We increased your methotrexate  dose to help manage your symptoms and prevent tolerance to infliximab . Please continue monitoring your infliximab  levels and follow up with your neurologist in October. Restart cognitive therapy in January.    -CHRONIC RIGHT LATERAL ANKLE SWELLING AND PAIN: Your chronic ankle swelling and pain may be due to mechanical issues. We recommend proceeding with the lymphedema therapy evaluation this week and following up with the orthopedic surgeon based on the therapy outcomes.    -RECENT RESOLVED PNEUMONIA: Your recent pneumonia was successfully treated with antibiotics, and your follow-up chest X-ray was clear. We recommend considering a referral to a pulmonologist for lung function tests and evaluation due to recurrent respiratory issues. Ensure you are up-to-date with the Prevnar 20 vaccine.    INSTRUCTIONS:  Please follow up with your neurologist in October and restart cognitive therapy in January. Proceed with the lymphedema therapy evaluation this week and follow up with the orthopedic surgeon based on the outcomes. Consider seeing a pulmonologist for lung function tests and evaluation. Ensure you are up-to-date with the Prevnar 20 vaccine.            You may retrieve any orders from this visit in Mychart by using these steps:    After signing into Mychart  1) click Messaging  2) click  Letters  3) select the Requisition  4) Review and click print    Please contact the clinic at [phone] with any questions and to schedule your follow up visit.    Sincerely,    Geni Derby, MD  Rheumatologist  [facility]  [address]  [place], [place] [address]  Phone: [phone]  Fax: [phone]

## 2024-06-10 NOTE — Progress Notes (Signed)
 [facility]  [facility]  Telephone: [phone]  Fax: [phone]  Send us  a mychart or inbasket message for the fastest response  ~~~~~~~~~~~~~~~~~~~~~~~~~~~~~~~~~~~~~~~~~~~~~~~~~~~~~~~~~~~~~~~~~~~~~~~~~~~~~~~~~~~~  Visit Date: 06/10/2024    Referring Neurologist:     CC: neuro Behcet's    HPI:   History was obtained from records review, patient,     43 y.o. year-old female HLA-B51 pos Behcet's with neuro Behcet's (CSF matched [facility]; neg brain mri).     She had 4 episodes in July. Lasting 30 seconds. Staring, some perseverative speech, headache. Not gtc's.     Overall memory is slowly improving with cognitive therapy.     Nausea related to strattera   More headaches but due for botox  tomorrow.     EXAM  Visit Vitals  BP 125/85 (BP Site: Left arm, Patient Position: Sitting, Cuff Size: Large)   Pulse 96   Temp 98.1 F (36.7 C) (Oral)   Resp 12   Wt (!) 139.3 kg (307 lb)   LMP  (LMP Unknown)   SpO2 97%   BMI 46.68 kg/m     General:   Optic Nerves:   Psychiatric.   Mental Status: The patient was awake, alert, appropriate. Still can't work due to cognitive dysfunction  Cranial Nerves: CN II-XII wnl  Motor: 5/5  Reflex:   Sensation:   Coordination:   Gait: normal      Imaging     Reports:  MRI Cervical Spine W WO Contrast  Result Date: 06/06/2024  Impression:  1.  No cervical cord signal abnormality or abnormal enhancement. 2.  Degenerative changes, greatest at C5-C6 with moderate right neural foraminal stenosis. No significant spinal canal stenosis. Electronically signed by: Gladis Minks M.D. [facility] MK: 06/06/24    MRI Brain W WO Contrast  Result Date: 06/06/2024  Impression:   No intracranial mass, acute hemorrhage, acute infarction, or evidence of hydrocephalus. Unremarkable MRI of the brain. Electronically signed by: Gladis Minks M.D. [facility] MK: 06/06/24    MRI Thoracic Spine W WO Contrast  Result Date:  06/05/2024  Impression:   1. Negative enhanced MR evaluation of the thoracic spinal cord and conus. 2. Mild generalized thoracic spondylosis without central stenosis. 3. Rotatory thoracic dextroscoliosis. 4. Otherwise unremarkable MR evaluation of the thoracic spinal column. No meaningful change in comparison to April 29, 2021. Electronically signed by: Sherlean Flank M.D. [facility] CM: 06/05/24       Testing       Labs  Lab Results   Component Value Date    HGBA1C 5.4 01/28/2023    EGFR >60.0 06/05/2024    CREAT 0.7 06/05/2024    WBC 10.60 (H) 06/05/2024    LYMPHOABS 2.5 07/25/2022    IGG 950 02/20/2023    IGA 216 01/25/2021    IGM 198 01/25/2021    B12 1,652 (H) 12/12/2023    VITD 39 12/12/2023       ASSESSMENT / PLAN  RTC: Return in about 3 months (around 09/10/2024) for [place].    1. Behcet recurrent disease (CMS/HCC)    2. Seronegative rheumatoid arthritis (CMS/HCC)    3. Seizure-like activity (CMS/HCC)    4. Chronic migraine without aura without status migrainosus, not intractable    5. Memory loss        43 y.o. F with neuro-Behcet's (CSF matched [facility]'s; normal MRI's). Sporadic seizure-like episodes, few times a month, very brief. Overall cognition is responding to  therapy.  Recent pneumonia and ongoing headaches can cause her to experience more difficulty than otherwise.     Continue cognitive rehab.     PLAN  - Continue remicade  5mg /kg, mtx 10mg , prednisone  10mg , otezla  30mg  bid.   - can try tapering prednisone  but if episodes increase then go back. Consider slow reduction, eg 1 mg per week. Defer to Dr Woodard  - VPA 500mg  tid , LFT's improved with reduced dose.   - vimpat  200mg  bid  - vitamin d3 2000 units daily  - continue /restart cognitive rehab    Orders today (details below)  No orders of the defined types were placed in this encounter.    No orders of the defined types were placed in this encounter.      I spent 40 minutes reviewing the records, talking with the patient  and developing their care plan. This excludes time for separately billed procedures.      Sammi Lockwood, MD PHD  Director, Neuroimmunology & MS Center  [facility]  [employer]  [address], [place], [place] [address]    Telephone [phone]  Fax [phone]   The fastest response is via [place]  ===================================================================  NFL (neurofilament light chain) Insurance Justification  Serum / plasma Neurofilament light chain is a well-established biomarker for monitoring MS, and regular testing has been incorporated into the Consortium of MS Centers [facility]) guidelines for the monitoring and clinical decision making for MS.     1)CMSC guidelines PDF: https://mscare.http://www.stevens.com/     2) [place] MS, Zora GORMAN Britain RA, [place], [place], Kuhle J, Lycke J, Olsson T; Consortium of Multiple Sclerosis Centers. Guidance for use of neurofilament light chain as a cerebrospinal fluid and blood biomarker in multiple sclerosis management. [employer]. 2024 Mar;101:104970. doi: 10.1016/j.ebiom.7975.895029. Epub 2024 Feb 13. PMID: 61645467; PMCID: [id number].    PATIENT INSTRUCTIONS PROVIDED  Patient was given an After Visit Summary with a copy of the testing orders, medications and the following other instructions:  There are no Patient Instructions on file for this visit.    IMPORTED INFORMATION FROM MEDICAL RECORDS   Diagnosis ICD-10-CM Associated Order   1. Behcet recurrent disease (CMS/HCC)  M35.2       2. Seronegative rheumatoid arthritis (CMS/HCC)  M06.00       3. Seizure-like activity (CMS/HCC)  R56.9       4. Chronic migraine without aura without status migrainosus, not intractable  G43.709       5. Memory loss  R41.3         No orders of the defined types were placed in this encounter.

## 2024-06-10 NOTE — Progress Notes (Signed)
 RHEUMATOLOGY FOLLOW UP NOTE  Telemedicine Documentation Requirements  Originating site (Patient location): home  Distant site (Provider location): Office  Provider and Title: Dr. Woodard  Type of Visit: Video Visit  Verbal Consent has been obtained to conduct a telemedicine visit on this date in order to minimize exposure to COVID-19.        PCP: [name], MD    HPI:    This patient is a 43 y.o. year female with Past Medical History of Prior Cholecystectomy, Wold-Parkinson White, Prior Hysterectomy who returns for follow up of Behcet's HLAB51+, recurrent oral ulcers, pathergy, low grade fevers and seronegative arthritis, uveitis  -ablation procedure for arrythmia 06/2022  -Was on Humira -lost efficacy and developed uveitis  -Otezla  for recurrent aphthous ulcers  -She is on dapsone from Dermatology, clindamycin  at night for skin lesions  Dermatologist started her on dapsone-she has had a few scars that are healing-had injections from her dermatologist.  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 Avelina Aspen  Sees Dr. 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    She is currently on low dose methotrexate , infliximab , and Otezla . Her last infliximab  infusion was on June 05, 2024. She experiences persistent double vision but has not had any recent symptoms indicative of uveitis. Neurological symptoms, including word-finding difficulties, remain unchanged. She completed a round of cognitive therapy but missed sessions due to pneumonia.    In July, she was diagnosed with pneumonia at an urgent care visit after presenting with a red, infected-looking salivary gland and subsequent chest X-ray. She  was treated with Augmentin  and a Z-Pak, followed by an inhaler for persistent shortness of breath. No fever, chills, or night sweats were noted, but she had a sore throat and cough. A follow-up chest X-ray was clear.    She reports severe nausea and vomiting every morning, which resolved after discontinuing Strattera , an ADHD medication. She had been on low dose naltrexone for SI joint pain, initially suspected to cause the gastrointestinal symptoms. No decline in neurological symptoms has been noted since stopping Strattera .    Her ankle remains swollen and painful, particularly when lying down, and she cannot wear a shoe on it due to the swelling. She is awaiting further evaluation by lymphedema specialists. The swelling has been persistent for a year and a half, with no significant change.    She received steroid injections in her SI joints, which provided significant relief. She is on prednisone  10 mg daily, and her methotrexate  dose was increased to four tablets once a week. She experiences a 'methotrexate  hangover' the day after dosing, characterized by increased fatigue and mild nausea.    Recent lab work from June 05, 2024, showed CRP less than 0.1, ESR 8, creatinine 0.7, AST 30, ALT 50, alkaline phosphatase 48, WBC 10.6, hemoglobin 14.4, hematocrit 42.2, and platelet count 305. Infliximab  levels are pending. MRI studies of the cervical spine, brain, and thoracic spine showed no significant abnormalities.    Results  LABS  CRP: <0.1 (02/06/2024)  ESR: 3 (02/06/2024)  WBC: 11.19 (02/06/2024)  Hb: 15.2 (02/06/2024)  Hct: 44.3 (02/06/2024)  PLT: 285 (02/06/2024)  Cr: 0.9 (02/06/2024)  AST: 27 (02/06/2024)  ALT: 37 (02/06/2024)  Alkaline phosphatase: 43 (02/06/2024)  Infliximab  antibodies: low (02/06/2024)    RADIOLOGY  Lower extremity venous ultrasound: No DVT (03/07/2024)  Ankle MRI: Soft tissue swelling, acute inflammation in tendon, chronic tendon changes (12/2023)      LABS  CRP: <0.1 (06/05/2024)  ESR: 8  (06/05/2024)  Comprehensive metabolic panel: Creatinine 0.7, AST 30, ALT 50, Alkaline phosphatase 48 (06/05/2024)  CBC: WBC 10.6, Hemoglobin 14.4, Hematocrit 42.2, Platelet count 305 (06/05/2024)  Valproic acid : 10.3 (06/05/2024)    RADIOLOGY  MRI cervical spine: No cervical cord signal abnormality or abnormal enhancement. Degenerative changes greatest at C5-C6 with moderate right neuroforaminal stenosis. No significant spinal canal stenosis. (06/04/2024)  MRI brain: No intracranial mass, acute hemorrhage, acute infarction, or evidence of hydrocephalus. Unremarkable. (06/04/2024)  MRI thoracic spine: Negative enhanced MRI evaluation of the thoracic spinal cord and conus. Mild generalized thoracic spondylosis without central stenosis. Rotatory thoracic dextroscoliosis. Otherwise unremarkable. (06/04/2024)            Previous Labs:  12/12/23  CMP AST/ALT 45/74  ESR wnl  CRP wnl  Infliximab  level 13  CBC WBC 14; H/H 15.5/45.9      Previous Labs:  10/17/23  CMP ALT 69  ESR wnl  CRP wnl  CBC WBC 10        Imaging Reports:  01/03/24  MRI left ankle.    Previous Imaging:  10/16/23  CT L Spine:  FINDINGS:   Scoliotic alignment is present. On sagittal imaging, there is no  significant listhesis. Vertebral body heights are maintained. There is no  fracture. There is no lytic or blastic lesion.     Endplate spurring is present throughout the lumbar spine. Varying degrees  of disc height loss are present. There is no obvious large disc bulge or  protrusion. Facet and spinous process hypertrophy noted.     The surrounding soft tissues are normal.     IMPRESSION:      1. No acute process, stable appearance.  2. No fracture.  3. Degenerative findings are present but there is no appreciable large disc  bulge or protrusion      Previous Labs:  08/22/23  CRP wnl  ESR wnl  CMP AST/ALT 02/08/81  Infliximab  Ab 14  Valproic Acid  9.0      Previous Labs:  06/26/23  Inflizimab level pending  Valproic Acid  level 75  CRP wnl  ESR wnl  CMP AST/ALT  53/93    Previous Labs:  05/01/23  Infliximab  level wnl  CRP wnl  ESR 14  CMP wnl  CBC wnl    Previous Imaging:  07/25/23:  FINDINGS:     Right ankle/foot: No significant joint effusion or synovitis or marginal  erosion is noted in the right ankle, midfoot, or forefoot. No tenosynovitis  noted.     Left ankle/foot: No significant joint effusion, synovitis, or marginal  erosion is noted in the left ankle, midfoot, or forefoot. No tenosynovitis  noted.     IMPRESSION:         1.No joint effusion, synovitis, or obvious joint erosion in either right or  left ankle/foot    Previous Imaging:  03/01/23  MRI Brain:  FINDINGS:      The brain parenchyma demonstrates no significant abnormality. There is no  abnormal parenchymal enhancement. No parenchymal mass lesion, acute  parenchymal hemorrhage, or acute infarction.     The ventricles, sulci, and cisterns are unremarkable in appearance.     The flow voids of the  major intracranial vessels appear intact.     The bones and extracranial soft tissues are unremarkable.     IMPRESSION:      No intracranial mass, acute hemorrhage, acute infarction, or evidence of  hydrocephalus. Normal MRI of the brain    02/10/23:  Neck US :  Narrative & Impression   HISTORY: Neck swelling. Enlarged cervical lymph nodes.      COMPARISON: 01/28/2023     TECHNIQUE: Targeted soft tissue ultrasound of the area of concern, in the  area of both parotid glands and at level 1.     FINDINGS:   No enlarged or otherwise abnormal appearing lymph node, other mass, fluid  collection, or other focal abnormality. The parotid glands appear symmetric  and normal in echotexture with no discrete mass.     IMPRESSION:      Normal exam         Previous Labs:  03/21/23  Surgical pathology A.  GASTRIC BIOPSY:          MINIMAL SUPERFICIAL CHRONIC INACTIVE INFLAMMATION, OTHERWISE     UNREMARKABLE; H. PYLORI NOT IDENTIFIED ON H&E STAIN        B.  COLON RANDOM BIOPS:     UNREMARKABLE COLONIC MUCOSA     03/06/23  CRP wnl  ESR  35  CMP wnl  CBC unremarkable    Previous Labs:  02/06/23  CRP wnl  ESR 24  CMP wnl  CBC WBC 10.96    Previous Labs:  11/25/22  Procalcitonin wnl  CMP wnl  CBC unremarkable      Previous Labs:  08/29/22  CBC platelets 350  BMP unremarkable  ESR wnl  CRP wnl      Previous Imaging:  12/16/22  MRI Lumbar Spine:  IMPRESSION:         1.  No acute osseous or soft tissue abnormality.  2.  Normal MR appearance of the distal spinal cord, conus medullaris and  cauda equina.  3.  Rotatory lumbar levoscoliosis.  4.  Lumbar spondylosis and facet arthrosis without central stenosis or  nerve root compression. Please see full details and segmental analysis  above.    CT Chest Angio:  11/23/22  FINDINGS:      LINES/TUBES: None.     LUNGS: No consolidation, edema or mass. There is linear atelectasis at the  lung bases.  PLEURA: No pleural effusions or pneumothorax.     HEART: Not enlarged.  MEDIASTINUM: No axillary, hilar or mediastinal lymphadenopathy.  PULMONARY ARTERIES: No acute pulmonary emboli.  AORTA: No aneurysm or dissection.     UPPER ABDOMEN: Hepatic steatosis. No acute abnormality.     BONES AND SOFT TISSUES: Unremarkable.     IMPRESSION:      No acute pulmonary embolism.    CT A/P  11/23/22:  FINDINGS:      LINES/TUBES: None.     LOWER THORAX: Bibasilar atelectasis.     LIVER/BILIARY TREE: The gallbladder is absent. There is hepatic steatosis.  No biliary dilation.  SPLEEN: No splenomegaly.  PANCREAS: No pancreatic mass or duct dilatation.     KIDNEYS/URETERS: No hydronephrosis, stones or solid mass lesions.  ADRENALS: No adrenal mass.  PELVIC ORGANS/BLADDER: No pelvic masses. The bladder is partially  distended.     PERITONEUM/RETROPERITONEUM: No free air or fluid.  LYMPH NODES: No lymphadenopathy.  VESSELS: No aortic aneurysm.     GI TRACT: No bowel wall thickening or dilation. Normal appendix.     BONES AND SOFT TISSUES: There are  degenerative changes in the spine. No  suspicious or destructive osseous lesion.      IMPRESSION:      No acute abnormality.    Previous Imaging:      09/19/22:  ECHO:  Left Ventricle    The left ventricle is normal in size. There is normal left ventricular  geometry. Left ventricular systolic function is normal with an ejection  fraction by Biplane Method of Discs of  60 %.  3D LVEF 58%. Left ventricular  segmental wall motion is normal. Left ventricular diastolic filling parameters  are consistent with Grade I diastolic dysfunction (impaired relaxation  pattern).  Right Ventricle    The right ventricular cavity size is normal in size. Normal right  ventricular systolic function.    08/27/22  MRI left Hip:  IMPRESSION:      1.Trace left hip joint effusion without significant synovitis. Tear of the  left anterosuperior acetabular labrum. Otherwise unremarkable left hip  joint.     2.Mild to moderate left distal gluteus minimus tendinosis and mild to  moderate left greater trochanteric bursitis with bursal edema. There is  mild left proximal hamstring tendinosis.      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     4.There has been interval enlargement of a left adnexal cyst since pelvic  ultrasound 03/18/2022, now measuring 3.3 cm. This could be further  evaluated with follow-up pelvic ultrasound.     5.Unchanged 3.8 cm cyst adjacent to the vaginal cuff post hysterectomy  since multiple prior exams favoring a benign etiology.      Previous Labs:  07/25/22  ESR 37  CRP wnl  CMP ALt 39  CBC wnl      Previous Imaging:  04/12/22  Cardiac MRI:  IMPRESSION:       1. No delayed gadolinium enhancement to suggest myocardial fibrosis or  scarring.  2. Normal right ventricular systolic function.  3. Qualitatively mild global left ventricular hypokinesis. Left ventricular  systolic function measures 32% and may be artifactually reduced secondary  to significant motion artifact.    Labs Reviewed:  04/19/22  Throat culture no B-hemolytic strep  POCT flu A/B  Abbot -      Previous  Labs:  01/14/22  CMP wnl  CRP wnl  ViT D 31  ESR wnl  Lipid panel wnl  TSH wnl      08/18/21  CBC wnl  CMP wnl  ESR wnl  CRP wnl    Previous Labs:  05/25/21  Quant Gold-  HepbSAb wnl  HepbSAg-  HepCAb-  HIV-        Previous Labs:  02/23/21  ESR wnl  CRP wnl  CMP ALT 42, AST normal    CRP wnl  ESR wnl  HLAB51 positive  IgE, IgA, IGG, IgM all wnl  CBC wnl    12/03/20  DIAGNOSIS:          A. Lesion, ulnar wrist, excision:     -Fibroadipose tissue and vasculature          B. Synovial tissue, biopsy:     -Fibroadipose tissue and synovium (see comment)          Comment:     Morphologic features typical of rheumatoid arthritis or infection are     not identified.    12/03/20  Bacterial Cultures: No Growth  Fungal Cultures: NG at one week  AFB: No growth    11/20/20  ESR 50  CRP wnl  IgG  subclasses wnl  ANA comprehensive panel negative        Previous Labs:    [facility] Labs:  10/23/20  C-ANCA, P-ANCA, MPO, PR3 all negative  Mitochondrial Ab-  ANA IFA-  Scl70-  Centromere-  RNP-  SMith-  TPPA Ab-      05/18/20  Ferritin 291  CMP AST/A:T 59/70  CBC elevated lymphocytes    02/06/20  MPO-  PR3-  SSA-  aCL IgM 14  aCL IgG, IgA -  Lupus anticoagulant -  B2IgG, IgM -  ACE wnl  CCP-  RF-  Vit D 31  TSH wnl  ESR wnl  CK wnl  C4 wnl  C3 wnl  CRP wnl    07/2019  ANA Screen-  LDH wnl    07/23/2018 lymph node biopsy:DIAGNOSIS:          Lymph node, deep right neck, excisional biopsy:     -Lymph node with non-specific reactive changes   Wound, AFB and Fungal cultures negative at this time    BONE MARROW 05/03/2019      BONE MARROW, LEFT POSTERIOR ILIAC CREST, CORE BIOPSY, CLOT SECTION,    ASPIRATE SMEAR AND TOUCH PREP:        - NORMOCELLULAR BONE MARROW (approximate50-60%) WITH TRILINEAGE HEMATOPOIESIS,  NORMAL MYELOID TO ERYTHROID RATIO AND ADEQUATE NUMBERS OF    MEGAKARYOCYTES WIT H UNREMARKABLE MORPHOLOGY        - A FEW SMALL REACTIVE LYMPHOID AGGREGATES IDENTIFIED, AND MILDLY  INCREASED NUMBERS OF EOSINOPHILS        - PENDING CYTOGENETIC STUDIES  OF BONE MARROW SAMPLE  - Remain pending as of 05/14/2019.        COMMENT:    The concurrent flow cytometry performed on the bone marrow aspirate  sample shows no evidence of immunophenotypic abnormalities      Imaging Reviewed:    08/16/20  MRI Brain:IMPRESSION:      1. No intracranial mass, hemorrhage, or hydrocephalus is detected.  2. There is normal pattern of enhancement of the brain parenchyma and  meningeal surfaces.    9/18 MRI Abdomen:  IMPRESSION:      1. Diffuse hepatic steatosis. No suspicious focal hepatic lesions.  2. No biliary ductal dilatation or choledocholithiasis.    02/03/20  MRI Pelvis:  IMPRESSION:         Cystic structure at the vaginal cuff, flank by both ovaries not  significantly changed in size compared to 2019. No abnormal internal  enhancement. This favors a benign process such as a peritoneal inclusion  cyst or a paraovarian cyst.    12/14/19 MRI R Hand:  IMPRESSION:      1.  No suspicious soft tissue mass, osseous mass, or masslike  enhancement in the right hand or wrist to correlate with the diffuse FDG  uptake on recent PET/CT.  2.  There is mild enhancing synovitis in the right wrist joint without  active joint erosion or bony destruction.  3.  A small dorsal ganglion cyst overlying the scapholunate interval.    12/06/19  Full Body PET Scan:    Hypermetabolic soft tissue mass in palmar R hand surface at base of the second digit-stable pulmonary nodulke 1.3 cm left lower lobe-recommend follow up Chest CT 3 months         The following sections were reviewed this encounter by the provider:   Tobacco  Allergies  Meds  Problems  Med Hx  Surg Hx  Fam Hx  PMH/PSH:  Past Medical History:   Diagnosis Date    Abdominal hernia 2016    Resolved by surgical repair    Abnormal vision March 2024    reading glasses    Amenorrhea 1999    Resolved by hysterectomy    Anxiety     Arrhythmia     WPW ablation in 2005, 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 Released from cardiology    Atrial flutter (CMS/HCC) 20+  years    Previous history of WPW    Behcet's disease (CMS/HCC)     She sees Dr. 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    02/11/23 stable on medical    Diarrhea     Dizziness 15+ years    Allergies, migraine    Ear, nose and throat disorder     seasonal allergies    Fatigue 2019    Bechet's    Fever 11/09/2022    Recurrent fever due to Behcet's Disease    Fracture of hand 2016    Right hand middle finger    Hard to intubate 2019    Headache 2002    migraine    History of adverse reaction to anesthesia 2005    Nausea    History of ulcer disease 2019    Bechets Disease    Hx of ovarian cyst     Lower back pain     not severe    Meningitis 2011    Viral // treated and resolved    Migraine 2002    Near syncope 1995    Previous history of WPW    Other physical therapy March 2023    Neck    Pain in joint 2019    RA part of Bechets Disease    Pain in wrist 11/09/2022    Palpitations 20+ years    Previous history of WPW    Post-operative nausea and vomiting 2005    Rash 2019    Behcets disease - controlled by medication    Rheumatoid arthritis (CMS/HCC) 2019    Part of Behcets Disease    Scoliosis 1985    moderate// used a Brace at a young age    Shingles 2015    Swelling of extremity 2019    Left ankle    Uveitis     using eyedrops        Past Surgical History:   Procedure Laterality Date    ABDOMINAL SURGERY  2016; 2012    Umbilical hernia; hysterectomy    ABLATION - PVC'S N/A 06/08/2022    Procedure: Ablation - PVC's;  Surgeon: Von Oh, MD;  Location: [facility];  Service: Cardiovascular;  Laterality: N/A;  SAME DAY DISCHARGE  CARTO NO TEE    ARTHROTOMY, WRIST Right 12/03/2020    Procedure: RIGHT UPPER EXTREMITY SYNOVIAL BIOPSY;  Surgeon: Bolling Oh, MD;  Location: [facility];  Service: Plastics;  Laterality: Right;    BIOPSY, LYMPH NODE N/A 07/23/2018    Procedure:  BIOPSY, LYMPH NODE;  Surgeon: Gloris Wilkie FALCON, MD;  Location: [id number];  Service: ENT;  Laterality: N/A;  NECK DEEP LYMPH NODE BIOPSY    BONE MARROW BIOPSY  2020    led to behcet's dx    CARDIAC ABLATION  2005    [facility]  at [facility] for  Wolff-Parkinson-White    CARDIAC CATHETERIZATION  2005    CHOLECYSTECTOMY  2009    COLONOSCOPY, DIAGNOSTIC (SCREENING) N/A 03/21/2023    Procedure: COLONOSCOPY, DIAGNOSTIC (SCREENING);  Surgeon: Leontine Countryman, MD;  Location: [facility];  Service: Gastroenterology;  Laterality: N/A;    DILATION AND CURETTAGE OF UTERUS  2010    EGD N/A 03/21/2023    Procedure: EGD;  Surgeon: Leontine Countryman, MD;  Location: [facility];  Service: Gastroenterology;  Laterality: N/A;    FINGER GANGLION CYST EXCISION  04/2018    FOOT SURGERY  2014    Ganglion cyst removal from Achilles    GANGLION CYST EXCISION  2014    04/2018 cyst removed [facility] index finger-[facility]    HAND SURGERY  January 2022    HERNIA REPAIR  2016    HYSTERECTOMY  2012    HYSTEROSCOPY  2011    indication: Abnormal bleeding    LAPAROSCOPIC, LYSIS, ADHESIONS N/A 11/09/2022    Procedure: LAPAROSCOPIC LYSIS OF ADHESIONS;  Surgeon: Jan, Ambareen G, MD;  Location: [facility];  Service: Gynecology;  Laterality: N/A;    LAPAROSCOPIC, SALPINGECTOMY Left 11/09/2022    Procedure: LAPAROSCOPIC, SALPINGECTOMY LEFT;  Surgeon: Madison Sedalia MATSU, MD;  Location: [facility];  Service: Gynecology;  Laterality: Left;    LAPAROSCOPY, DIAGNOSTIC N/A 11/09/2022    Procedure: LAPAROSCOPY, DIAGNOSTIC;  Surgeon: Madison Sedalia MATSU, MD;  Location: [facility];  Service: Gynecology;  Laterality: N/A;  [id number]    UMBILICAL HERNIA REPAIR  2016    UPPER GASTROINTESTINAL ENDOSCOPY  2009    UPPER GASTROINTESTINAL ENDOSCOPY  2009    WISDOM TOOTH EXTRACTION  1998        FH/SH:  Family History   Adopted: Yes       Social History     Tobacco Use    Smoking status: Never     Passive exposure: Never    Smokeless tobacco:  Never   Vaping Use    Vaping status: Never Used   Substance Use Topics    Alcohol use: Never    Drug use: Never        Meds/ Allergies:  No outpatient medications have been marked as taking for the 06/10/24 encounter (Telemedicine Visit) with Woodard Geni HERO, MD.     Current Facility-Administered Medications for the 06/10/24 encounter (Telemedicine Visit) with Woodard Geni HERO, MD   Medication Dose Route Frequency Provider Last Rate Last Admin    botulinum toxin type A  (BOTOX ) injection 155 Units  155 Units Intramuscular Q12 Weeks Koutsandreas, Allison K, FNP   175 Units at 03/19/24 1200    onabotulinumtoxin A (BOTOX ) injection 175 Units  175 Units Intramuscular Q12 Weeks Kastl, Charlotte M, MD   175 Units at 08/16/23 1013     Allergies   Allergen Reactions    Fruit Blend Flavor [Flavoring Agent (Non-Screening)] Anaphylaxis and Swelling     Tropical Fruit     Latex Drug-Induced Flushing, Hives, Other (See Comments), Swelling, Itching and Rash    Cyanoacrylate Itching and Rash    Other Itching and Rash     Sutures Monocryl    Oxycodone Hallucinations, Hives and Other (See Comments)     Hallucinations, took 1 time 25 years ago    Wound Dressing Adhesive Itching and Other (See Comments)     adhesive          PHYSICAL EXAM  General- WNWD, alert and oriented, NAD  Eyes- EOMI, PERRL, no conjunctival injection, no  scleral icterus  ENMT- face symmetric without lesions  Skin- no rash, no alopecia  Pulm: No conversational dyspnea, breathing comfortable on room air  Neuro - no notable neurological deficits, no facial asymmetry          LABS:  Lab Results   Component Value Date    WBC 10.60 (H) 06/05/2024    HGB 14.4 06/05/2024    HCT 42.2 06/05/2024    MCV 97.2 (H) 06/05/2024    PLT 305 06/05/2024      Lab Results   Component Value Date    CREAT 0.7 06/05/2024    BUN 8 06/05/2024    NA 142 06/05/2024    K 3.8 06/05/2024    CL 107 06/05/2024    CO2 25 06/05/2024      Lab Results   Component Value Date    ALT 50 06/05/2024    AST  30 06/05/2024    ALKPHOS 48 06/05/2024    BILITOTAL 0.4 06/05/2024     Lab Results   Component Value Date    ESR 8 06/05/2024    ESR 8 04/02/2024    ESR 3 02/06/2024    ESR 5 12/12/2023    ESR 3 10/17/2023      Lab Results   Component Value Date    CRP <0.1 06/05/2024    CRP <0.1 04/02/2024    CRP <0.1 02/06/2024    CRP <0.1 12/12/2023    CRP <0.1 10/17/2023      No results found for: ANA   Lab Results   Component Value Date    RHEUMFACTOR <15.0 02/06/2020      Lab Results   Component Value Date    CCP <15.6 02/06/2020     No results found for: URICACID        ASSESSMENT/PLAN:    This patient is a 43 y.o. year female with Past Medical History of Prior Cholecystectomy, Wolf-Parkinson White, Prior Hysterectomy, Fatty Liver Disease who returns for follow up of Behcet's Disease characterized by seronegative rheumatoid arthritis complicated with recent uveitis for which she was switched to Remicade  from humira .  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 MRI showed no significant abnormalities. Labs stable. No recent uveitis flare, but persistent diplopia and neurological symptoms. Methotrexate  increased to enhance immune suppression and prevent infliximab  tolerance.  - Continue infliximab , methotrexate , and Otezla .  - Monitor infliximab  levels and antibodies.  - Continue follow-up with neurologist, next appointment in October.  - Continue cognitive therapy, restart in January.  -Seronegative RA-contollred on current regimen, suspect ankle pain not related to Behcets    High Risk Med Use  Frequent lab monitoring required in the setting of MTX use, at least every 3 months to monitor for cytopenias, renal insufficiency, liver toxicity.  She is obtaining labs with Remicade  infusions every 2 months    Chronic right lateral  ankle swelling and pain  Chronic ankle swelling and pain with no significant imaging findings. Differential suggests mechanical issues. Orthopedic evaluation recommends lymphedema therapy trial.  - Proceed with lymphedema therapy evaluation this week recommended by ortho  - Follow up with orthopedic surgeon based on lymphedema therapy outcomes.    Uveitis  -No recurrent symptoms-she follows with opthho every 6 months    Neurologic Symptoms  She follwos with neurology-diplopia,  seizures, emdication management per Dr. [name], prednisone  dosing, on 10 mg daily, per Dr. [name] for ongoing neuroBehcets, we have brought up a taper in the past he recommendeds continuing    Recent resolved pneumonia  Pneumonia treated successfully with antibiotics. Residual dyspnea managed with inhaler. Follow-up chest x-ray clear. Consideration for pulmonary evaluation due to recurrent issues.  - Consider referral to pulmonologist for lung function tests and evaluation.  - Ensure up-to-date with Prevnar 20 vaccine with PCP  -See pulmonary for baseline PFTs testing                1. Behcet's disease (CMS/HCC)  - folic acid  (FOLVITE ) 1 MG tablet; Take 1 tablet (1,000 mcg) by mouth once daily  Dispense: 90 tablet; Refill: 1    2. Seronegative rheumatoid arthritis (CMS/HCC)  - folic acid  (FOLVITE ) 1 MG tablet; Take 1 tablet (1,000 mcg) by mouth once daily  Dispense: 90 tablet; Refill: 1    3. High risk medication use  - folic acid  (FOLVITE ) 1 MG tablet; Take 1 tablet (1,000 mcg) by mouth once daily  Dispense: 90 tablet; Refill: 1    4. Uveitis  - folic acid  (FOLVITE ) 1 MG tablet; Take 1 tablet (1,000 mcg) by mouth once daily  Dispense: 90 tablet; Refill: 1    5. Body mass index (BMI) 50.0-59.9, adult (CMS/HCC)    6. Chronic pain of left ankle  - folic acid  (FOLVITE ) 1 MG tablet; Take 1 tablet (1,000 mcg) by mouth once daily  Dispense: 90 tablet; Refill: 1                                             FOLLOW UP:  Return in about 3 months (around  09/10/2024) for in person, 40 minutes.                                                                                  Geni Derby, MD  Rheumatologist  [facility]  [address]  [place], [place] [address]  Phone: [phone]  Fax: [phone]

## 2024-06-11 ENCOUNTER — Ambulatory Visit: Payer: BLUE CROSS/BLUE SHIELD | Attending: Neurology | Admitting: No Specialty

## 2024-06-11 ENCOUNTER — Other Ambulatory Visit: Payer: Self-pay

## 2024-06-11 DIAGNOSIS — R110 Nausea: Secondary | ICD-10-CM

## 2024-06-11 DIAGNOSIS — G43709 Chronic migraine without aura, not intractable, without status migrainosus: Secondary | ICD-10-CM

## 2024-06-11 DIAGNOSIS — Z79899 Other long term (current) drug therapy: Secondary | ICD-10-CM

## 2024-06-11 DIAGNOSIS — M352 Behcet's disease: Secondary | ICD-10-CM

## 2024-06-11 MED ORDER — AJOVY 225 MG/1.5ML SC SOAJ
1.0000 | SUBCUTANEOUS | 11 refills | Status: AC
  Filled 2024-06-26: qty 1.5, 30d supply, fill #0
  Filled 2024-07-22: qty 1.5, 30d supply, fill #1
  Filled 2024-08-23: qty 1.5, 30d supply, fill #2
  Filled 2024-08-28: qty 1.5, 30d supply, fill #3
  Filled 2024-09-29: qty 1.5, 30d supply, fill #4
  Filled 2024-10-29: qty 1.5, 30d supply, fill #5
  Filled 2024-11-21: qty 1.5, 30d supply, fill #6

## 2024-06-11 MED ORDER — ONDANSETRON 8 MG PO TBDP
8.0000 mg | ORAL_TABLET | Freq: Three times a day (TID) | ORAL | Status: DC | PRN
Start: 2024-06-11 — End: 2024-08-01

## 2024-06-11 NOTE — Progress Notes (Signed)
 06/11/2024      CC: migraine    History was obtained from patient   History of Present Illness:  Hailey [name] is a 43 y.o. 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     Aug 2025:   History of Present Illness  Hailey [name] is a 43 year old female with chronic migraines who presents for headache management.    She experiences an average of five to six headaches per month, which have increased in intensity and often break through her current treatment regimen. She uses Nurtec PRN and Qulipta  as a preventive.  Eletriptan  is used as a rescue medication, occasionally requiring two doses for relief.    She has recently been on Strattera  and multiple antibiotics due to pneumonia, experiencing adverse effects, including nausea, that resolved upon discontinuation of Strattera . She is currently not taking Strattera .    She has a history of nausea requiring Zofran , which she took daily at a dose of 8 mg for three months.         Oct 2024:   She presents for Botox  injections    She is using atogepant  60mg , Nurtec and eletrtipan for her migraine management.     She sees Dr. [name] and has a 48 hours VEEG scheduled.      Jan 2024:   She presents for Botox  injections. Headaches remain wel controlled. She reports only 2 breakthrough headache days in January.       Notes reviewed: Admission for viral illness in Jan 2024  Last injections Sep 12, 2022.     Mar 15 2022  Headache hx: since college  Location:bilateral and band like and left eye  Described as sharp and throbbing  Occurs:>15 HA days per month  Duration:>4 hours  Intensity: moderate to severe  Aura:none  Associated with photo/phonophobia, nausea and dizziness  Denies focal motor/sensory or visual changes.   No positional or autonomic symptoms  Exacerbating factors:unknown  Alleviating factors:unknown  Sleep:OK  Mood:high, working in coping mechanisms    Past medications  tried:  Nurtec  Emgality   Rizatriptan   imitrex   buspar ,   Topiramate: SE  reglan ,   Propranolol ineffective  hydroxyzine   Depakote   Atogepant     Has nto tried Aimovig, Ajovy  or Vyepti      Past Medical History:  Past Medical History:   Diagnosis Date    Abdominal hernia 2016    Resolved by surgical repair    Abnormal vision March 2024    reading glasses    Amenorrhea 1999    Resolved by hysterectomy    Anxiety     Arrhythmia     WPW ablation in 2005, 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 Released from cardiology    Atrial flutter (CMS/HCC) 20+ years    Previous history of WPW    Behcet's disease (CMS/HCC)     She sees Dr. 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    02/11/23 stable on medical    Diarrhea     Dizziness 15+ years    Allergies, migraine    Ear, nose and throat disorder     seasonal allergies    Fatigue 2019    Bechet's    Fever 11/09/2022    Recurrent fever due to Behcet's Disease  Fracture of hand 2016    Right hand middle finger    Hard to intubate 2019    Headache 2002    migraine    History of adverse reaction to anesthesia 2005    Nausea    History of ulcer disease 2019    Bechets Disease    Hx of ovarian cyst     Lower back pain     not severe    Meningitis 2011    Viral // treated and resolved    Migraine 2002    Near syncope 1995    Previous history of WPW    Other physical therapy March 2023    Neck    Pain in joint 2019    RA part of Bechets Disease    Pain in wrist 11/09/2022    Palpitations 20+ years    Previous history of WPW    Post-operative nausea and vomiting 2005    Rash 2019    Behcets disease - controlled by medication    Rheumatoid arthritis (CMS/HCC) 2019    Part of Behcets Disease    Scoliosis 1985    moderate// used a Brace at a young age    Shingles 2015    Swelling of extremity 2019    Left ankle    Uveitis     using eyedrops       Past Surgical  History:  Past Surgical History:   Procedure Laterality Date    ABDOMINAL SURGERY  2016; 2012    Umbilical hernia; hysterectomy    ABLATION - PVC'S N/A 06/08/2022    Procedure: Ablation - PVC's;  Surgeon: Von Oh, MD;  Location: [facility];  Service: Cardiovascular;  Laterality: N/A;  SAME DAY DISCHARGE  CARTO NO TEE    ARTHROTOMY, WRIST Right 12/03/2020    Procedure: RIGHT UPPER EXTREMITY SYNOVIAL BIOPSY;  Surgeon: Bolling Oh, MD;  Location: [facility];  Service: Plastics;  Laterality: Right;    BIOPSY, LYMPH NODE N/A 07/23/2018    Procedure: BIOPSY, LYMPH NODE;  Surgeon: Gloris Wilkie FALCON, MD;  Location: [facility];  Service: ENT;  Laterality: N/A;  NECK DEEP LYMPH NODE BIOPSY    BONE MARROW BIOPSY  2020    led to behcet's dx    CARDIAC ABLATION  2005    [facility]  at [facility] for [facility]    CARDIAC CATHETERIZATION  2005    CHOLECYSTECTOMY  2009    COLONOSCOPY, DIAGNOSTIC (SCREENING) N/A 03/21/2023    Procedure: COLONOSCOPY, DIAGNOSTIC (SCREENING);  Surgeon: Leontine Countryman, MD;  Location: [facility];  Service: Gastroenterology;  Laterality: N/A;    DILATION AND CURETTAGE OF UTERUS  2010    EGD N/A 03/21/2023    Procedure: EGD;  Surgeon: Leontine Countryman, MD;  Location: [facility];  Service: Gastroenterology;  Laterality: N/A;    FINGER GANGLION CYST EXCISION  04/2018    FOOT SURGERY  2014    Ganglion cyst removal from Achilles    GANGLION CYST EXCISION  2014    04/2018 cyst removed from index finger-[facility]    HAND SURGERY  January 2022    HERNIA REPAIR  2016    HYSTERECTOMY  2012    HYSTEROSCOPY  2011    indication: Abnormal bleeding    LAPAROSCOPIC, LYSIS, ADHESIONS N/A 11/09/2022    Procedure: LAPAROSCOPIC LYSIS OF ADHESIONS;  Surgeon: Jan, Ambareen G, MD;  Location: [facility];  Service: Gynecology;  Laterality: N/A;    LAPAROSCOPIC, SALPINGECTOMY Left  11/09/2022    Procedure: LAPAROSCOPIC, SALPINGECTOMY LEFT;  Surgeon: Jan, Ambareen G, MD;   Location: [facility];  Service: Gynecology;  Laterality: Left;    LAPAROSCOPY, DIAGNOSTIC N/A 11/09/2022    Procedure: LAPAROSCOPY, DIAGNOSTIC;  Surgeon: Madison Sedalia MATSU, MD;  Location: [facility];  Service: Gynecology;  Laterality: N/A;  [id number]    UMBILICAL HERNIA REPAIR  2016    UPPER GASTROINTESTINAL ENDOSCOPY  2009    UPPER GASTROINTESTINAL ENDOSCOPY  2009    WISDOM TOOTH EXTRACTION  1998       Allergies:  Fruit blend flavor [flavoring agent (non-screening)], Pineapple, Latex, Cyanoacrylate, Other, Oxycodone, and Wound dressing adhesive    Family History:  Family History   Adopted: Yes     No other family history related to current complaints.    Social History:  Social History     Tobacco Use    Smoking status: Never     Passive exposure: Never    Smokeless tobacco: Never   Vaping Use    Vaping status: Never Used   Substance Use Topics    Alcohol use: Never    Drug use: Never       Medications:  Current Outpatient Medications   Medication Sig Dispense Refill    albuterol  sulfate HFA (PROVENTIL ) 108 (90 Base) MCG/ACT inhaler Inhale 2 puffs into the lungs every 4 (four) hours as needed for Wheezing 1 each 0    Apremilast  (Otezla ) 30 MG Tablet Take 1 tablet (30 mg) by mouth 2 (two) times daily 180 tablet 1    Atogepant  60 MG Tablet TAKE 1 TABLET (60 MG) BY MOUTH NIGHTLY FOR MIGRAINE PREVENTION 30 each 11    botulinum toxin type A  (BOTOX ) 200 units injection INJECT 155 UNITS INTRAMUSCULARLY EVERY 12 WEEKS 1 each 3    busPIRone  (BUSPAR ) 5 MG tablet Take 1 tablet (5 mg) by mouth 2 (two) times daily 180 tablet 3    clindamycin  (CLEOCIN  T) 1 % lotion Apply topically every morning      colesevelam  (WELCHOL ) 625 MG tablet TAKE 2 TABLETS (1,250 MG) BY MOUTH EVERY MORNING 180 tablet 3    Dapsone 7.5 % Gel Apply topically every morning      diazePAM  (VALIUM ) 5 MG tablet Take 1 tablet (5 mg) by mouth every 6 (six) hours as needed for Anxiety (take 1/2 hour prior to the MRI) 5 tablet 0    dicyclomine  (BENTYL ) 20 MG  tablet TAKE 1 TABLET (20 MG) BY MOUTH EVERY 6 (SIX) HOURS 360 tablet 3    divalproex , ER, extended release (DEPAKOTE  ER) 500 MG 24 hr tablet Take 1 tablet (500 mg) by mouth 3 (three) times daily 270 tablet 3    dorzolamide-timolol (COSOPT) 2-0.5 % ophthalmic solution       eletriptan  (RELPAX ) 40 MG tablet Take 1 tablet (40 mg) by mouth as needed for Migraine .  Wait at least 2 hours prior to taking second dose.  Max 2 tabs/24 hours. Do not use more than 3 days per week. 12 tablet 5    EPINEPHrine  (EPIPEN  2-PAK) 0.3 MG/0.3ML Solution Auto-injector injection Inject 0.3 mLs (0.3 mg) into the muscle once as needed (Anaphylaxis) 2 each 2    folic acid  (FOLVITE ) 1 MG tablet Take 1 tablet (1,000 mcg) by mouth once daily 90 tablet 1    hydrOXYzine  (ATARAX ) 10 MG tablet TAKE 1 TABLET BY MOUTH EVERY DAY NIGHTLY 90 tablet 3    inFLIXimab  100 MG injection Infuse into the vein once  every eight weeks      Lacosamide  (VIMPAT ) 200 MG Tablet Take 1 tablet (200 mg) by mouth 2 (two) times daily 180 tablet 1    lidocaine -prilocaine (EMLA) cream Apply topically as needed Dermatology procedures      Melatonin-Pyridoxine (MELATIN PO) Take 4 mg by mouth nightly      methocarbamol  (ROBAXIN ) 750 MG tablet Take 1 tablet (750 mg) by mouth 4 (four) times daily 30 tablet 2    methotrexate  2.5 MG tablet Take 4 tablets (10 mg) by mouth once a week 48 tablet 1    Multiple Vitamin (MULTIVITAMIN PO) Take by mouth daily      Naltrexone HCl, Pain, (Naltrex) 1.5 MG Cap Take by mouth      omeprazole  (PriLOSEC) 40 MG capsule Take 1 capsule (40 mg) by mouth once daily In the morning 30 mins before eating 90 capsule 1    ondansetron  (ZOFRAN -ODT) 8 MG disintegrating tablet Dissolve 1 tablet (8 mg) in the mouth every 8 (eight) hours as needed for Nausea 90 tablet 3    predniSONE  (DELTASONE ) 10 MG tablet Take 1 tablet (10 mg) by mouth once daily 30 tablet 5    Rimegepant Sulfate  75 MG Tablet Dispersible TAKE 1 TABLET (75 MG) BY MOUTH AS NEEDED (MIGRAINE) 16  each 11    valACYclovir  (VALTREX ) 1000 MG tablet Take 2 tabs at onset; repeat once in 12 hours.  Total 4 tabs per outbreak 12 tablet 3    vitamin B-12 1000 MCG tablet Take 1 tablet (1,000 mcg) by mouth daily 30 tablet 0    VITAMIN D  PO Take 1 tablet by mouth daily Dosage unsure       Current Facility-Administered Medications   Medication Dose Route Frequency Provider Last Rate Last Admin    onabotulinumtoxin A (BOTOX ) injection 175 Units  175 Units Intramuscular Q12 Weeks Lilly Gasser M, MD   175 Units at 06/11/24 1112       General Exam:  LMP  (LMP Unknown)   Respiratory rate wnl.   Gen:  Well-developed, well-nourished.  No acute distress.  Cooperative with exam.  HEENT:  NC/AT. No icterus or conjunctival hemorrhage noted.  Neck: Full range of motion.        NEUROLOGICAL EXAM    Mental Status: Awake, alert, oriented. Speech fluent without dysarthria. Follows commands well. Attention, memory, and concentration intact. Fund of knowledge appropriate for education.     Cranial Nerves:   Extraocular movements are full. No nystagmus seen. Facial sensation to light touch appears intact. Face is symmetric. Hearing is intact to conversational speech. Cranial nerves II-XII otherwise appears intact.     Motor: Moves all extremities well    Coordination:  No truncal ataxia.    Gait: Normal and steady.      Investigations:      Imaging:  MRI Brain W WO Contrast    MRI BRAIN WITHOUT AND WITH CONTRAST MS PROTOCOL     HISTORY: Neuro Behcet's disease, seizures     COMPARISON: MRI brain 03/01/2023     TECHNIQUE: MRI of the brain performed on a 3.0 Tesla scanner without and  with intravenous contrast. Examination performed per demyelinating disease  protocol.      CONTRAST: Gadavist  10 mL     FINDINGS:      The brain parenchyma demonstrates no significant abnormality. The  hippocampi are symmetric in size and demonstrate normal internal  architecture and signal intensity bilaterally. There is no parenchymal mass  lesion, acute  parenchymal  hemorrhage, or acute infarction. There is no  abnormal parenchymal enhancement.     There is no evidence of hydrocephalus. No extra-axial fluid collections. No  abnormal leptomeningeal or pachymeningeal enhancement.     The flow voids of the major intracranial vessels appear intact.     The bones and extracranial soft tissues are unremarkable.     IMPRESSION:      No intracranial mass, acute hemorrhage, acute infarction, or evidence of  hydrocephalus. Unremarkable MRI of the brain.        Electronically signed by: Gladis Minks M.D.  [facility]     MK: 06/06/24         Procedure: Chemodeneveration.   Indication: Chronic migraine     Discussed Botox  procedure, answered questions, consent is in the chart.    One  vial of 200 units of Botox  was diluted,  with 4  mL of 0.9% NS. Using a 30 gauge half-inch needle, the following muscles were injected with the following doses:   Bilateral corrugator: 10 units divided in 2 sites  Procerus: 5 units in 1 site  Frontalis: 20 units divided in 4 sites  Temporalis: 40 units divided in 8 sites   Occipitalis: 30 units divided in 6 sites  Cervical paraspinals: 20 units divided in 4 sites  Trapezius: 30 units divided in 6 sites   Masseter 20 units divided in 2 sites  A total of 175 units of botulinum toxin A was administered, divided in 31 sites.   There was 45 units waste.   The patient tolerated procedure well and No other adverse effects immediately noted.  LOT [id number]  Exp 09/2026    Hailey Quail, MD  [facility]        Recommendations:  1. Chronic migraine without aura without status migrainosus, not intractable  - Fremanezumab -vfrm (Ajovy ) 225 MG/1.5ML Solution Auto-injector; Inject 1 .pen into the skin every 30 (thirty) days  Dispense: 1 each; Refill: 11    2. Behcet's disease (CMS/HCC)    3. Nausea  - ondansetron  (ZOFRAN -ODT) 8 MG disintegrating tablet; Dissolve 1 tablet (8 mg) in the mouth every 8 (eight) hours as  needed for Nausea    4. Medication management  - Fremanezumab -vfrm (Ajovy ) 225 MG/1.5ML Solution Auto-injector; Inject 1 .pen into the skin every 30 (thirty) days  Dispense: 1 each; Refill: 11          Frankye Jermesha Sottile is a 43 y.o. 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     Tolerated Botox  well today     Plan:   Repeat Botox  in 12 weeks    Continue Nurtec 75mg  PRN    Stop atogepant     Start Ajovy  monthly for migraine prevention    Continue eletriptan  PRN abortive    F/up in 3 months for repeat Botox  injections            Referring (see communications section)      The patient should seek emergent care if there is any change in the symptoms. Proper use and all side effects of medications discussed    Discussed the importance of sleep hygiene, maintaining appropriate hydration, avoid overuse of caffeine and OTC medications for headache lifestyle modification      Please contact me with any questions. Patients and [facility] Providers can reach me via MyChart.    Hailey Quail, MD  [facility]  [place]: [phone]  [place]: [phone]    *  DISCLAIMER: This note was generated by the Epic EMR system/ Dragon speech recognition and may contain inherent errors or omissions not intended by the user. Grammatical errors, random word insertions, deletions, pronoun errors and incomplete sentences are occasional consequences of this technology due to software limitations. Not all errors are caught or corrected. If there are questions or concerns about the content of this note or information contained within the body of this dictation they should be addressed directly with the author for clarification.*

## 2024-06-11 NOTE — Patient Instructions (Signed)
Our plan:             Today's Visit:      In today's visit, I reviewed your medications and records relating your health - prior testing, blood work, reports of other health care providers present in your electronic medical record.     If you have pertinent records from any non-[facility] doctors that you would like to review, please have them sent to us or bring to them to your next office visit.     A copy of today's visit will be sent to your referring doctor and/or primary care doctor, if you have one listed in our system.    Let me know if there are things we could have done better for your office visit.    Patient satisfaction survey:      If you receive a patient satisfaction survey, I would greatly appreciate it if you would complete it. We value your feedback.     Contact me online:      Patient Portal online - Please sign up for MyChart -- this is the best way to communicate with your team here.  There is a messaging feature, where you can us messages anytime of day.  It is the best way to communicate with us and get test results, medication refills, or ask questions.     You can expect to get a response within 24-48 hours during weekdays.  If you do not receive a timely response, resend your request and inform us. My goal is for every question answered ever day.  Average response for a phone call maybe 3 days due to the volume we receive (which is why MyChart is preferred).    If you are having a medical emergency -- call 911, DO NOT SEND A MESSAGE THROUGH MYCHART.     Coupons for medication:      If you have any trouble affording your medications, check out www.goodrx.com for coupons and competitive prices in your area.  If you need further assistance, let us know so we can work with you and your insurance to make sure you get the best care.    Thank you for trusting me with your health.      Take care,   [place], MD  [facility]   [facility]: [phone]  [place]: [phone]

## 2024-06-12 ENCOUNTER — Telehealth (INDEPENDENT_AMBULATORY_CARE_PROVIDER_SITE_OTHER): Payer: Self-pay

## 2024-06-12 ENCOUNTER — Encounter (INDEPENDENT_AMBULATORY_CARE_PROVIDER_SITE_OTHER): Payer: Self-pay | Admitting: Physician Assistant

## 2024-06-12 ENCOUNTER — Other Ambulatory Visit: Payer: Self-pay

## 2024-06-12 NOTE — Telephone Encounter (Signed)
 Left message to schedule NP with Dr. Wilfrid  and PFT appt.

## 2024-06-13 ENCOUNTER — Ambulatory Visit: Payer: BLUE CROSS/BLUE SHIELD | Attending: Neurology

## 2024-06-13 DIAGNOSIS — R413 Other amnesia: Secondary | ICD-10-CM | POA: Insufficient documentation

## 2024-06-13 DIAGNOSIS — M352 Behcet's disease: Secondary | ICD-10-CM | POA: Insufficient documentation

## 2024-06-13 DIAGNOSIS — R41844 Frontal lobe and executive function deficit: Secondary | ICD-10-CM | POA: Insufficient documentation

## 2024-06-13 DIAGNOSIS — R41840 Attention and concentration deficit: Secondary | ICD-10-CM | POA: Insufficient documentation

## 2024-06-13 DIAGNOSIS — R600 Localized edema: Secondary | ICD-10-CM | POA: Insufficient documentation

## 2024-06-13 DIAGNOSIS — R41841 Cognitive communication deficit: Secondary | ICD-10-CM | POA: Insufficient documentation

## 2024-06-13 NOTE — PT Eval Note (Signed)
 PHYSICAL THERAPY LOWER EXTREMITY LYMPHEDEMA EVALUATION    Patient: Hailey [name] Referred By: Myrle Lenis, MD   DOB: 1981/08/23 Medical Record #: [id number]   Age: 43 y.o. 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     Date of Service PT Received On: 06/13/24   Treatment Time Start Time: 1400 to Stop Time: 1510   Time Calculation Time Calculation (min): 70 min   Visit # PT Visit  PT Visit Number: 1  PT- Certification Start Date: 06/13/24  PT- Certification End Date: 08/06/24  PT- Medical ICD-10 DX: R60.0   Units Billed PT Evaluation  $ PT Evaluation Moderate Complexity (97162): 1 Procedure           CPT Evaluation Code Justification  CRITERIA JUSTIFICATION DESCRIPTION   Personal factors and/or co-morbidities that impact the plan of care. Factors that affect plan of care include:  Behcet's disease (complicated with neuro manifestation)  Multiple immunosuppressive drug use  Atypical presentation for lymphedema 3 (High Complexity)     Body Systems Examined Impairments noted in:  Musculoskeletal  Vascular  Body structures/regions (multisystem involvement) 3 or more elements (Moderate Complexity)     Clinical Presentation Presentation is not changing rapidly but seems to be slowly worsening and is not abating spontaneously Evolving (Moderate Complexity)     Clinical Decision Making Standardized Assessment used:  See below for details. Evaluation Code:  Moderate Complexity       Personal Protective Equipment Utilized:  None    Reason for consultation: 43 year old female, accompanied by her spouse, who is referred by her orthopedist because of an evolving localized swelling of the left lateral malleolar area which has been atypical in presentation, and for which clear  diagnosis has been evasive.  Patient was referred to an orthopedist by her rheumatologist due to this localized enlargement, which is uncomfortable and causes pain with footwear, anything with a heel counter that comes close to the malleolar area.  She has had imaging (Doppler US  to r/o DVT, MRI and CT) and nothing has shown.  Excision has been considered but not as a first option if this is interstitial tissue swelling, and this is the reason she has been referred to the lymphedema clinic.  Edema is constant, does not recede with elevation, is sensitive to pressure when side lying in bed, even on the right (opposite side), so both direct pressure and change in joint position toward eversion bother her.  She has had no cellulitis, no weeping, blistering or skin changes and does not perceive swelling in the lower or upper part of that leg.  This enlargement was first noticed in January of 2024.  It is not rapidly changing, though it seems to be slightly enlarging over time, but does not fluctuate in size.  Of note is that in March, prednisone  was added to her immunosuppressive medications (others include inflixumab and methotrexate ).  Pateint is 5'8, 307 lb      Medical History:  Behcet's with neuro involvement, seronegative RA, seizure like activity, chronic migraine, memory loss,  anxiety, hx WPW requiring ablation, obesity, concussion, claustrophobia, chronic low back pain, hx shingles, hx uveitis, hx hysterectomy, right wrist arthrotomy, cervical LN biopsy, cholecystectomy,       Vascular tests:  venous duplex studies/ findings:  negative for DVT      Social History:   educated as an Pensions consultant, not able to work due to medical disability, pursuing retirement.  Lives with spouse who is supportive      Medications:   fremanezumab , ondansetron , folic acid , albuterol  sulfate HFA, rimegepant sulfate , omeprazole , diazepam , lacosamide , apremilast , methotrexate , prednisone , Botox  injections, methocarbamol , eletriptan ,  divalproex  ER, hydroxyzine , colesevelam , dicyclomine , buspirone , Vitamin b12, epinephrine , dapsone      Allergies: fruit blend flavor agent, pineapple, latex, cyanoacrylate, monocryl sutures, oxycodone, wound dressing adhesives     Precautions:  none related to her swelling.  She would be considered at elevated fall risk      Pain:  4/10        Pain is located: left lateral ankle       Pain is attributable to lymphedema: discomfort/pain symptoms not consistent with those of lymphedema      Symptoms of claudication:No      Degree, location,  character of edema: presents with visible enlargement of the left lateral malleolar area. She also has visible puffiness of the dorsal feet, though this is symmetrical.    She does not have pitting edema or fibrosis, with the tissue texture being more fibrofatty throughout both legs.  Palpation of the left lateral malleolar area reveals a discrete area of enlargement, which is mobile and feels as though it is encapsulated. It is non fluctuant so does not feel like a localized fluid enlargement but it is neither pitting nor fibrotic in texture.  It feels like a soft tissue mass and not like interstitial edema.        Limb volumetry:  to assess for subtle size difference between her legs, limited limb volumes were measured between medial malleolus and proximal calf at 4cm intervals. Though the volume difference is 5%, only the first two circumferences are larger, and they are in the same area as the localized enlargement    Skin condition/palpation:  skin is normal color, texture, smooth, well hydrated without lesions or discoloration        Pulses:  palpable DP      Sensation: no sensory alteration, but does have tenderness with pressure over the enlarged area       Functional range of motion/musculoskeletal status: independently ambulatory without a device.  Has some mobility limitations due to multi joint pain not related to this area, and difficulty using hands.      Fall risk:  elevated due to joint pain symptoms, and symptoms of Behcet's    Functional impact: patient has localized discomfort impacting her being able to wear closed shoes for colder weather, anything that has a heel counter.       Education provided: I explained my impression that this is not lymphedema, explaining the nature of lymphatic insufficiency and the nature of interstitial edema, causes and presentation, with her localized tissue enlargement not consistent with edema of lymphatic origin.    I offered a trial of compression garments to assess response, given that lymphedema is largely a clinical diagnosis though I do not anticipate that this will reduce this area given its clinical presentation.  I showed patient and spouse how to properly don a compression garment after showing them a sample of a class 1 knee high with an open toe. She will require a wide calf garment  Patient/caregiver verbalize understanding.      Intervention today:  evaluation, education  She will be assisted with procurement of a Mediven Comfort size V extra wide  calf regular length open toe 20-27mmHg knee high to assess response        Assessment:    43 year old female with atypical localized swelling of the left lateral malleolar area.  This does not seem to be interstitial edema due to lymphatic insufficiency, but rather a localized area that seems to be more soft tissue in nature, possibly a lipoma. Given that it is an area impeding the wearing of closed shoes, a trial of compression was discussed with and agreed upon by patient, and she will be assisted with procurement of 20-32mmHg compression knee highs to see if this helps her symptoms and comfort with shoe fit.  She and spouse were instructed in how to don and wear, however, were also advised they can return if fit or donning is problematic as assistive devices can be utilized if needed.  UP to 2 more sessions if necessary        Goals:    1. Assist patient with procurement of  compression garments through outside vendor using available insurance benefits.   2. Patient will demonstrate ability to manage garments independently with or without assistive devices within 2 sessions after receipt and instruction.         Plan:  up to 2 sessions      CPT Codes To Be Used During This Plan Of Care: Test/ measure 97750, Therapeutic activities 97530, and Prosthetic fit/ train (garments) 97760, [phone]      Therapist signature  Camie Drape, PT, DPT, CLT-LANA      Thank you for this referral.  Please feel free to contact me with any concerns or questions.        MD signature:  ___________________________________     Date: _______________  I certify that this plan of care is medically necessary.  Myrle Lenis, MD

## 2024-06-15 ENCOUNTER — Other Ambulatory Visit: Payer: Self-pay

## 2024-06-24 ENCOUNTER — Other Ambulatory Visit: Payer: Self-pay

## 2024-06-25 ENCOUNTER — Encounter: Payer: Self-pay | Admitting: Internal Medicine

## 2024-06-25 ENCOUNTER — Other Ambulatory Visit: Payer: Self-pay

## 2024-06-25 DIAGNOSIS — M0600 Rheumatoid arthritis without rheumatoid factor, unspecified site: Secondary | ICD-10-CM

## 2024-06-25 DIAGNOSIS — R569 Unspecified convulsions: Secondary | ICD-10-CM

## 2024-06-25 DIAGNOSIS — D84821 Immunodeficiency due to drugs: Secondary | ICD-10-CM

## 2024-06-25 DIAGNOSIS — M352 Behcet's disease: Secondary | ICD-10-CM

## 2024-06-25 DIAGNOSIS — H209 Unspecified iridocyclitis: Secondary | ICD-10-CM

## 2024-06-26 ENCOUNTER — Other Ambulatory Visit: Payer: Self-pay

## 2024-06-26 NOTE — Progress Notes (Signed)
 Specialty Pharmacy Initial Fill Note    Hailey [name] is a 43 y.o. female, who is being followed by [facility] Specialty Pharmacy team for management of: RxSp Migraine (Enrolled) for the following services:  Refill Management  Benefits and PA Management  Refill Management    Medications monitored by Specialty Pharmacy:   Fremanezumab -vfrm (Ajovy ) 225 MG/1.5ML Solution Auto-injector   Inject 1 .pen into the skin every 30 (thirty) days    botulinum toxin type A  (BOTOX ) 200 units injection   INJECT 155 UNITS INTRAMUSCULARLY EVERY 12 WEEKS    Rimegepant Sulfate  75 MG Tablet Dispersible   TAKE 1 TABLET (75 MG) BY MOUTH AS NEEDED (MIGRAINE)      Initial Fill  Medications Linked to Program: Fremanezumab -vfrm (Ajovy )  HIPAA verified (patient name & dob or patient name & address) with approved contact: Yes  HIPAA verification: please specify: SELF  Changes to medications, herbals or supplements?: No  Delivery method: Courier  Preferred time?: PM  Delivery confirmation: delivery address: Delivery Address Reviewed & Accurate  Enter delivery address: [name] [place] [address]  Delivery phone number: [phone]  Delivery confirmation: signature required for delivery: No  Delivery confirmation: patient informed of and confirms delivery date. Enter Delivery date: : 06/28/24  Supplies needed?: Alcohol swabs; Bandages; Sharps container  Does patient have any concerns about safely storing medications (at the correct temperature, away from children/pets,etc.)?: No  Do any medications need mixed or dated?: No  Financial confirmation: patient informed of financial responsibility and copay amount: Yes  Financial responsibility: copay amount: 0  Patient is informed and has agreed to opt into [facility] Patient Management Program: Yes  Delivery closing: any immediate questions for the pharmacist: No  Did patient agree to fill?: Yes        Hailey [name]

## 2024-06-26 NOTE — Progress Notes (Signed)
 Specialty Pharmacy Clinical Onboarding    Patient filling Ajovy  with the pharmacy, clinical does not actively follow due to low risk. We will assist as needed for any questions or concerns.    Medications monitored by Specialty Pharmacy:   Fremanezumab -vfrm (Ajovy ) 225 MG/1.5ML Solution Auto-injector   Inject 1 .pen into the skin every 30 (thirty) days    botulinum toxin type A  (BOTOX ) 200 units injection   INJECT 155 UNITS INTRAMUSCULARLY EVERY 12 WEEKS    Rimegepant Sulfate  75 MG Tablet Dispersible   TAKE 1 TABLET (75 MG) BY MOUTH AS NEEDED (MIGRAINE)

## 2024-06-27 ENCOUNTER — Other Ambulatory Visit: Payer: Self-pay

## 2024-07-02 ENCOUNTER — Encounter: Payer: Self-pay | Admitting: No Specialty

## 2024-07-03 ENCOUNTER — Other Ambulatory Visit: Payer: Self-pay

## 2024-07-03 ENCOUNTER — Telehealth: Payer: Self-pay | Admitting: No Specialty

## 2024-07-03 NOTE — Progress Notes (Signed)
 Specialty Pharmacy Refill Note    Mileigh Tilley is a 43 y.o. female, who is being followed by [facility] Specialty Pharm[facility] team for management of: RxSp Migraine (Enrolled) for the following services:  Refill Management  Benefits and PA Management  Refill Management  Clinical Management    Medications monitored by Specialty Pharmacy:   Fremanezumab -vfrm (Ajovy ) 225 MG/1.5ML Solution Auto-injector   Inject 1 .pen into the skin every 30 (thirty) days    botulinum toxin type A  (BOTOX ) 200 units injection   INJECT 155 UNITS INTRAMUSCULARLY EVERY 12 WEEKS    Rimegepant Sulfate  75 MG Tablet Dispersible   TAKE 1 TABLET (75 MG) BY MOUTH AS NEEDED (MIGRAINE)      Refill Coordination  Medications Linked to Program: Rimegepant Sulfate   HIPAA verified (patient name & dob or patient name & address) with approved contact: Yes  How has _______ (specialty medication) helped you manage your _______ (condition) from very well to very poor: Well  Changes to allergies?: No  Changes to medications, herbals or supplements?: No  New conditions (diagnosis) since last pharmacy outreach: No  Since the last fill, has the patient experienced any unplanned office visit, urgent care, emergency room, or hospital admission: No  Since the last fill, any new or worsened side effects?: No  Financial problems or insurance changes : No  Since the last fill, has the patient missed any doses of their specialty medication : No  Since your last fill, how many doses of your specialty medications were missed?: 0  Doses left of specialty medications: Unable to assess  Does the patient have the correct number of remaining doses: Yes  Patient confirmations: received welcome packet, patient bill of rights, & privacy practices: Patient has received all    Delivery Information  Delivery confirmation: signature required for delivery: No  Delivery method: FedEx  Delivery confirmation: delivery address: Delivery Address Reviewed & Accurate  Enter delivery  address: [address] [place], [place]. [address]  Delivery phone number: [phone]  Delivery confirmation: patient informed of and confirms delivery date. Enter Delivery date: : 07/05/24  Preferred time?: Anytime  Number of medications in delivery: 1  Is there any medication that is due not being filled?: No  Supplies needed?: No supplies needed  Does patient have any concerns about safely storing medications (at the correct temperature, away from children/pets,etc.)?: No  Do any medications need mixed or dated?: No  Financial confirmation: patient informed of financial responsibility and copay amount: Yes  Financial responsibility: copay amount: 0.00  Copay form of payment: Credit card on file  Questions or concerns for the pharmacist?: No  Are any medications first time fills?: No  Additional assessment notes : 16/30, $0.00        Medicare Part B Fill? No    Makenzie Vittorio Matos-Pacheco

## 2024-07-03 NOTE — Telephone Encounter (Signed)
 RN called and spoke with the pt in related with MyChart message.       The pt reported about migraine like below       1. Duration of migraine: ongoing for over one week.   2. Medications taken within the last 24 hrs : Eletriptan  / Nurtec     3 Associated symptoms: nausea, dizziness, light sensitivity   4. Current pain score: 9/10, it's been fluctuated but majority time is 8-9   5. Identified triggers: none reported    6. Relief measures: no relief with current interventions     Wanted to use Rescue Room. Notified to the Dr. Kastl

## 2024-07-04 ENCOUNTER — Ambulatory Visit (INDEPENDENT_AMBULATORY_CARE_PROVIDER_SITE_OTHER): Payer: BLUE CROSS/BLUE SHIELD | Admitting: Physician Assistant

## 2024-07-04 ENCOUNTER — Other Ambulatory Visit: Payer: Self-pay

## 2024-07-04 ENCOUNTER — Telehealth: Payer: Self-pay | Admitting: No Specialty

## 2024-07-04 VITALS — BP 151/93 | HR 90 | Temp 98.1°F | Resp 16 | Ht 68.0 in | Wt 307.0 lb

## 2024-07-04 DIAGNOSIS — G43911 Migraine, unspecified, intractable, with status migrainosus: Secondary | ICD-10-CM

## 2024-07-04 MED ORDER — PROCHLORPERAZINE EDISYLATE 10 MG/2ML IJ SOLN
10.0000 mg | Freq: Once | INTRAMUSCULAR | Status: AC
Start: 2024-07-04 — End: 2024-07-04
  Administered 2024-07-04: 10 mg via INTRAMUSCULAR

## 2024-07-04 MED ORDER — KETOROLAC TROMETHAMINE 30 MG/ML IJ SOLN
15.0000 mg | Freq: Once | INTRAMUSCULAR | Status: AC
Start: 2024-07-04 — End: 2024-07-04
  Administered 2024-07-04: 15 mg via INTRAMUSCULAR

## 2024-07-04 NOTE — Telephone Encounter (Signed)
 RN called to the pt in related with MyChart message. left vocie mail that Rescue room is not avaliable tomorrow, Friday due to already fully booked and let her know what the Dr. Norvell advise she might has to go to ED/UC if her headache is severe.    Tuesday is available ( Monday is Labor day),but that may be too far out, given that her migraine pain is not sustainable.

## 2024-07-04 NOTE — Progress Notes (Signed)
  [facility]  OFFICE NOTE         Subjective   Historian: Patient    Chief Complaint   Patient presents with    Migraine     Pt c/o of migraine x 8 days. Pt reports her rescue medications are not working. Pt c/o head and jaw pain as well.         History of Present Illness  Hailey [name] is a 43 year old female with migraines and neurobesets who presents with an 8-day migraine.    She experiences sensitivity to light, nausea, and dizziness without numbness, tingling, weakness, or new vision changes. She feels on the verge of vomiting but has not vomited. Her migraine treatment was recently changed to Ajovy  injections. She uses Zofran  for severe nausea. Nurtec and eletriptan , which usually resolve her migraines, have not been effective this time. Her new medications began last year and altered her migraine pattern, but this migraine does not feel different from those since its onset.    History:  Medications and Allergies reviewed.   Pertinent Past Medical, Surgical, Family and Social History were reviewed.          Objective     Vitals:    07/04/24 1109   BP: (!) 151/93   Pulse: 90   Resp: 16   Temp: 98.1 F (36.7 C)   TempSrc: Tympanic   SpO2: 98%   Weight: (!) 139.3 kg (307 lb)   Height: 1.727 m (5' 8)     Physical Exam  Constitutional:       General: She is not in acute distress.     Appearance: Normal appearance. She is well-developed.   HENT:      Head: Normocephalic and atraumatic.      Nose: Nose normal.      Mouth/Throat:      Mouth: Mucous membranes are moist.      Pharynx: No oropharyngeal exudate.     Eyes: Conjunctivae and EOM are normal. Pupils are equal, round, and reactive to light. No scleral icterus. Neck:      Thyroid : No thyroid  mass or thyromegaly.      Vascular: No carotid bruit.   Cardiovascular:      Rate and Rhythm: Normal rate and regular rhythm.      Pulses: Normal pulses.      Heart sounds: Normal heart sounds. No murmur heard.  Pulmonary:      Effort:  Pulmonary effort is normal. No respiratory distress.      Breath sounds: Normal breath sounds. No wheezing.   Abdominal:      General: Bowel sounds are normal. There is no distension.      Palpations: Abdomen is soft. There is no mass.      Tenderness: There is no abdominal tenderness.   Musculoskeletal:         General: Normal range of motion.      Cervical back: Normal range of motion and neck supple.   Lymphadenopathy:      Cervical: No cervical adenopathy.   Neurological:      General: No focal deficit present.      Mental Status: She is alert and oriented to person, place, and time. Mental status is at baseline.      Cranial Nerves: No cranial nerve deficit, dysarthria or facial asymmetry.      Sensory: Sensation is intact. No sensory deficit.      Motor: Motor function is intact. No tremor or  pronator drift.      Coordination: Coordination is intact. Coordination normal. Finger-Nose-Finger Test normal.      Deep Tendon Reflexes: Reflexes are normal and symmetric.      Comments: CN grossly intact  LUE 4/5 strength (per patient this is baseline)  Slowed finger-to-nose (per patient this is baseline for migraine episodes)  Gait slightly unsteady (per patient this is baseline)   Skin:     General: Skin is warm and dry.      Findings: No rash.   Psychiatric:         Mood and Affect: Mood normal.         Speech: Speech normal.         Behavior: Behavior normal.   Vitals and nursing note reviewed.     Physical Exam    Urgent Care Course   There were no labs reviewed with this patient during the visit.    There were no x-rays reviewed with this patient during the visit.    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 Current preventive and symptomatic treatments include Ajovy , nurtec, eletriptan  and Zofran .  - Administer Toradol  15 mg intramuscularly.  - Administer Compazine  10 mg intramuscularly.  - Advised ER visit for pain management if symptoms persist, considering immunocompromised status.       Hailey was seen today for migraine.    Diagnoses and all orders for this visit:    Intractable migraine with status migrainosus, unspecified migraine type  -     ketorolac  (TORADOL ) injection 15 mg  -     prochlorperazine  (COMPAZINE ) injection 10 mg         The indications for early follow-up with PCP and return to UC were discussed. Patient/family received education on the working diagnosis, diagnostic uncertainties, and proposed treatment plan. Indications for emergency evaluation in the ED were reviewed. Written and verbal discharge instructions were provided and discussed and all questions from the patient/family were addressed, with no apparent barriers.      Verbal consent obtained to record this visit.

## 2024-07-04 NOTE — Patient Instructions (Addendum)
 Continue regular migraine regimen as prescribed by your neurologist.   Take ibuprofen /tylenol  as needed.   We gave you a toradol  and compazine  injections for treatment of migraine.     Take up to 1000 mg Tylenol  (this is the equivalent of 3 regular strength Tylenol  tablets or 2 extra strength Tylenol  tablets taken together) up to every 8 hours as needed for fevers/aches and pains.  Take up to 600 mg ibuprofen  (this is the equivalent of 3 regular strength ibuprofen  tablets taken together) up to every 8 hours as needed for fever/aches and pains.  You may stagger the Tylenol  and ibuprofen  so you are taking something different every 4 hours for better symptom management.     If your headache does not improve, please go to the Emergency Department for pain control. Otherwise follow up with your neurologist.

## 2024-07-16 ENCOUNTER — Encounter: Payer: Self-pay | Admitting: Internal Medicine

## 2024-07-18 ENCOUNTER — Other Ambulatory Visit (INDEPENDENT_AMBULATORY_CARE_PROVIDER_SITE_OTHER): Payer: Self-pay | Admitting: Otolaryngology

## 2024-07-19 ENCOUNTER — Other Ambulatory Visit: Payer: Self-pay

## 2024-07-19 NOTE — Progress Notes (Signed)
 Specialty Pharmacy Refill Note    Hailey [name] is a 43 y.o. female, who is being followed by [facility] team for management of: RxSp Migraine (Enrolled) for the following services:  Refill Management  Benefits and PA Management  Refill Management  Clinical Management    Medications monitored by Specialty Pharmacy:   Fremanezumab -vfrm (Ajovy ) 225 MG/1.5ML Solution Auto-injector   Inject 1 .pen into the skin every 30 (thirty) days    botulinum toxin type A  (BOTOX ) 200 units injection   INJECT 155 UNITS INTRAMUSCULARLY EVERY 12 WEEKS    Rimegepant Sulfate  75 MG Tablet Dispersible   TAKE 1 TABLET (75 MG) BY MOUTH AS NEEDED (MIGRAINE)      Refill Coordination  Medications Linked to Program: Fremanezumab -vfrm (Ajovy )  HIPAA verified (patient name & dob or patient name & address) with approved contact: Yes  How has _______ (specialty medication) helped you manage your _______ (condition) from very well to very poor: Well  Changes to allergies?: No  Changes to medications, herbals or supplements?: No  New conditions (diagnosis) since last pharmacy outreach: No  Since the last fill, has the patient experienced any unplanned office visit, urgent care, emergency room, or hospital admission: No  Since the last fill, any new or worsened side effects?: No  Financial problems or insurance changes : No  Since the last fill, has the patient missed any doses of their specialty medication : No  Since your last fill, how many doses of your specialty medications were missed?: 0  Doses left of specialty medications: 0  Does the patient have the correct number of remaining doses: Yes  Patient confirmations: received welcome packet, patient bill of rights, & privacy practices: Patient has received all    Delivery Information  Delivery confirmation: signature required for delivery: Yes  Delivery method: Courier  Delivery confirmation: delivery address: Delivery Address Reviewed & Accurate  Enter delivery address:  [address] [place], [place]. [address]  Delivery phone number: [phone]  Delivery confirmation: patient informed of and confirms delivery date. Enter Delivery date: : 07/26/24  Preferred time?: Anytime  Number of medications in delivery: 1  Is there any medication that is due not being filled?: No  Supplies needed?: No supplies needed  Does patient have any concerns about safely storing medications (at the correct temperature, away from children/pets,etc.)?: No  Do any medications need mixed or dated?: No  Financial confirmation: patient informed of financial responsibility and copay amount: Yes  Financial responsibility: copay amount: 0.00  Copay form of payment: Credit card on file  Questions or concerns for the pharmacist?: No  Are any medications first time fills?: No  Additional assessment notes : 1.5/30, $0.00        Medicare Part B Fill? No    Hailey [name]

## 2024-07-22 ENCOUNTER — Other Ambulatory Visit: Payer: Self-pay

## 2024-07-25 ENCOUNTER — Other Ambulatory Visit: Payer: Self-pay

## 2024-07-26 ENCOUNTER — Other Ambulatory Visit: Payer: Self-pay

## 2024-07-26 NOTE — Progress Notes (Signed)
 Specialty Pharmacy Refill Note    Hayden Mabin is a 43 y.o. female, who is being followed by [facility] team for management of: RxSp Migraine (Enrolled) for the following services:  Refill Management  Benefits and PA Management  Refill Management  Clinical Management    Medications monitored by Specialty Pharmacy:   Fremanezumab -vfrm (Ajovy ) 225 MG/1.5ML Solution Auto-injector   Inject 1 pen into the skin every 30 (thirty) days    botulinum toxin type A  (BOTOX ) 200 units injection   INJECT 155 UNITS INTRAMUSCULARLY EVERY 12 WEEKS    Rimegepant Sulfate  75 MG Tablet Dispersible   TAKE 1 TABLET (75 MG) BY MOUTH AS NEEDED (MIGRAINE)      Refill Coordination  Medications Linked to Program: Rimegepant Sulfate   HIPAA verified (patient name & dob or patient name & address) with approved contact: Yes  How has _______ (specialty medication) helped you manage your _______ (condition) from very well to very poor: Well  Changes to allergies?: No  Changes to medications, herbals or supplements?: No  New conditions (diagnosis) since last pharmacy outreach: No  Since the last fill, has the patient experienced any unplanned office visit, urgent care, emergency room, or hospital admission: No  Since the last fill, any new or worsened side effects?: No  Financial problems or insurance changes : No  Since your last fill, how many doses of your specialty medications were missed?: 0  Doses left of specialty medications: Unable to assess  Does the patient have the correct number of remaining doses: Yes  Patient confirmations: received welcome packet, patient bill of rights, & privacy practices: Patient has received all  Patient confirmation: patient previously received the drug monograph(s) for their specialty medication(s): Yes    Delivery Information  Delivery confirmation: signature required for delivery: No  Delivery method: FedEx  Delivery confirmation: delivery address: Delivery Address Reviewed &  Accurate  Enter delivery address: [address] [place], [place]. [address]  Delivery phone number: [phone]  Delivery confirmation: patient informed of and confirms delivery date. Enter Delivery date: : 08/06/24  Preferred time?: Anytime  Number of medications in delivery: 1  Supplies needed?: No supplies needed  Does patient have any concerns about safely storing medications (at the correct temperature, away from children/pets,etc.)?: No  Do any medications need mixed or dated?: No  Financial confirmation: patient informed of financial responsibility and copay amount: Yes  Financial responsibility: copay amount: 0.00  Copay form of payment: Credit card on file  Questions or concerns for the pharmacist?: No  Are any medications first time fills?: No  Additional assessment notes : 16/30, $0.00        Medicare Part B Fill? No    Yaritsa Savarino Matos-Pacheco

## 2024-07-29 ENCOUNTER — Other Ambulatory Visit: Payer: Self-pay

## 2024-07-29 ENCOUNTER — Encounter: Payer: Self-pay | Admitting: Neurology

## 2024-07-31 ENCOUNTER — Ambulatory Visit: Payer: BLUE CROSS/BLUE SHIELD | Attending: Anatomic and Clinical Pathology

## 2024-07-31 ENCOUNTER — Other Ambulatory Visit (INDEPENDENT_AMBULATORY_CARE_PROVIDER_SITE_OTHER): Payer: BLUE CROSS/BLUE SHIELD

## 2024-07-31 ENCOUNTER — Other Ambulatory Visit (INDEPENDENT_AMBULATORY_CARE_PROVIDER_SITE_OTHER): Payer: Self-pay | Admitting: Family Medicine

## 2024-07-31 VITALS — BP 129/84 | HR 74 | Temp 98.2°F | Wt 304.3 lb

## 2024-07-31 DIAGNOSIS — R569 Unspecified convulsions: Secondary | ICD-10-CM | POA: Insufficient documentation

## 2024-07-31 DIAGNOSIS — R110 Nausea: Secondary | ICD-10-CM

## 2024-07-31 DIAGNOSIS — M352 Behcet's disease: Secondary | ICD-10-CM

## 2024-07-31 DIAGNOSIS — M0600 Rheumatoid arthritis without rheumatoid factor, unspecified site: Secondary | ICD-10-CM | POA: Insufficient documentation

## 2024-07-31 DIAGNOSIS — H209 Unspecified iridocyclitis: Secondary | ICD-10-CM | POA: Insufficient documentation

## 2024-07-31 LAB — COMPREHENSIVE METABOLIC PANEL
ALT: 29 U/L (ref <=55)
AST (SGOT): 20 U/L (ref <=41)
Albumin/Globulin Ratio: 1.2 (ref 0.9–2.2)
Albumin: 3.8 g/dL (ref 3.5–4.9)
Alkaline Phosphatase: 44 U/L (ref 37–117)
Anion Gap: 11 (ref 5.0–15.0)
BUN: 11 mg/dL (ref 7–21)
Bilirubin, Total: 0.5 mg/dL (ref 0.2–1.2)
CO2: 24 meq/L (ref 17–29)
Calcium: 9.4 mg/dL (ref 8.5–10.5)
Chloride: 106 meq/L (ref 99–111)
Creatinine: 0.8 mg/dL (ref 0.4–1.0)
GFR: >60 mL/min/1.73 m2 (ref >=60.0)
Globulin: 3.2 g/dL (ref 2.0–3.6)
Glucose: 83 mg/dL (ref 70–100)
Hemolysis Index: 15 {index}
Potassium: 4.1 meq/L (ref 3.5–5.3)
Protein, Total: 7 g/dL (ref 6.0–8.3)
Sodium: 141 meq/L (ref 135–145)

## 2024-07-31 LAB — LAB USE ONLY - CBC WITH DIFFERENTIAL
Absolute Basophils: 0.03 x10 3/uL (ref 0.00–0.08)
Absolute Eosinophils: 0.08 x10 3/uL (ref 0.00–0.44)
Absolute Immature Granulocytes: 0.05 x10 3/uL (ref 0.00–0.07)
Absolute Lymphocytes: 6.01 x10 3/uL — ABNORMAL HIGH (ref 0.42–3.22)
Absolute Monocytes: 0.66 x10 3/uL (ref 0.21–0.85)
Absolute Neutrophils: 5.12 x10 3/uL (ref 1.10–6.33)
Absolute nRBC: 0 x10 3/uL (ref <=0.00)
Basophils %: 0.3 %
Eosinophils %: 0.7 %
Hematocrit: 42 % (ref 34.7–43.7)
Hemoglobin: 14.1 g/dL (ref 11.4–14.8)
Immature Granulocytes %: 0.4 %
Lymphocytes %: 50.3 %
MCH: 33.3 pg (ref 25.1–33.5)
MCHC: 33.6 g/dL (ref 31.5–35.8)
MCV: 99.1 fL — ABNORMAL HIGH (ref 78.0–96.0)
MPV: 8.2 fL — ABNORMAL LOW (ref 8.9–12.5)
Monocytes %: 5.5 %
Neutrophils %: 42.8 %
Platelet Count: 295 x10 3/uL (ref 142–346)
Preliminary Absolute Neutrophil Count: 5.12 x10 3/uL (ref 1.10–6.33)
RBC: 4.24 x10 6/uL (ref 3.90–5.10)
RDW: 13 % (ref 11–15)
WBC: 11.95 x10 3/uL — ABNORMAL HIGH (ref 3.10–9.50)
nRBC %: 0 /100{WBCs} (ref <=0.0)

## 2024-07-31 LAB — C-REACTIVE PROTEIN: C-Reactive Protein: 0.1 mg/dL (ref 0.0–1.1)

## 2024-07-31 LAB — SEDIMENTATION RATE: Sed Rate: 3 mm/h (ref <=20)

## 2024-07-31 MED ORDER — METHYLPREDNISOLONE SODIUM SUCC 40 MG IJ SOLR (WRAP)
40.0000 mg | Freq: Once | INTRAMUSCULAR | Status: AC
Start: 2024-07-31 — End: 2024-07-31
  Administered 2024-07-31: 40 mg via INTRAVENOUS
  Filled 2024-07-31: qty 1

## 2024-07-31 MED ORDER — SODIUM CHLORIDE 0.9 % IV SOLN
250.0000 mL | INTRAVENOUS | Status: DC
Start: 2024-07-31 — End: 2024-07-31
  Administered 2024-07-31: 250 mL via INTRAVENOUS
  Filled 2024-07-31: qty 250

## 2024-07-31 MED ORDER — SODIUM CHLORIDE 0.9 % IV SOLN
5.0000 mg/kg | Freq: Once | INTRAVENOUS | Status: AC
Start: 2024-07-31 — End: 2024-07-31
  Administered 2024-07-31: 690 mg via INTRAVENOUS
  Filled 2024-07-31: qty 690

## 2024-07-31 MED ORDER — DIPHENHYDRAMINE HCL 50 MG/ML IJ SOLN
25.0000 mg | Freq: Once | INTRAMUSCULAR | Status: AC
Start: 2024-07-31 — End: 2024-07-31
  Administered 2024-07-31: 25 mg via INTRAVENOUS
  Filled 2024-07-31: qty 1

## 2024-07-31 MED ORDER — ACETAMINOPHEN 325 MG PO TABS
650.0000 mg | ORAL_TABLET | Freq: Once | ORAL | Status: AC
Start: 2024-07-31 — End: 2024-07-31
  Administered 2024-07-31: 650 mg via ORAL
  Filled 2024-07-31: qty 2

## 2024-07-31 NOTE — Progress Notes (Signed)
 [facility] Infusion   Visit Date: 07/31/2024        Hailey [name] is a 43 y.o. female patient    Patient presents to the infusion center for #12 Infliximab . Pt has chronic migraines, has baseline LLE edema, ambulates better per pt--doesn't use cane frequently anymore. Denies SOB, cough, chest pain, fevers.       Pre-Medications: acetaminophen  (Tylenol ) and diphenhydramine  (Benadryl ), Solumedrol     Line Type: Peripheral IV:  Left arm  Blood Return verified prior to administration: Yes    Vital Signs:  Patient Vitals for the past 12 hrs:   BP Temp Pulse   07/31/24 1126 129/84 -- 74   07/31/24 0850 135/82 -- 83   07/31/24 0824 (!) 177/124 98.2 F (36.8 C) 93      Body surface area is 2.57 meters squared.      Infliximab  infused @ 140ml/hr over 2 hours.     Hypersensitivity Reaction Noted: No  IV Post Hydration: No, not required  Treatment Outcome:Patient tolerated treatment well. Peripheral Line flushed, +BBR and deacessed.    Education: Patient/Family educated regarding the expected outcomes/side effects possible interactions of treatments and medications. Patient verbalizes understanding.    Follow-up Plan: Discharged in stable condition. Accompanied by, self. Instructed to contact physician if any complications or unmanageable symptoms occur.    A return to the Infusion Center for the next treatment, has been scheduled.    RTC: 09/25/24    Greig Nova, RN,    07/31/2024  12:41 PM

## 2024-08-01 NOTE — Telephone Encounter (Signed)
 Last office visit: 05/30/24, CPE 01/23/23  Last refill on: 06/11/24 MD Kastl (Neuro)  Upcoming appointment: none

## 2024-08-02 ENCOUNTER — Other Ambulatory Visit: Payer: Self-pay

## 2024-08-02 LAB — VALPROIC ACID LEVEL, TOTAL AND FREE
Valproic Acid Free: 7.8 mg/L (ref 4.8–17.3)
Valproic Acid Level: 76.5 mg/L (ref 50.0–100.0)

## 2024-08-05 ENCOUNTER — Other Ambulatory Visit: Payer: Self-pay

## 2024-08-06 ENCOUNTER — Ambulatory Visit (INDEPENDENT_AMBULATORY_CARE_PROVIDER_SITE_OTHER): Payer: BLUE CROSS/BLUE SHIELD | Admitting: Internal Medicine

## 2024-08-06 ENCOUNTER — Ambulatory Visit
Admission: RE | Admit: 2024-08-06 | Discharge: 2024-08-06 | Disposition: A | Discharge status: Home / Self Care | Payer: BLUE CROSS/BLUE SHIELD | Source: Ambulatory Visit | Attending: Family Medicine | Admitting: Family Medicine

## 2024-08-06 ENCOUNTER — Encounter (INDEPENDENT_AMBULATORY_CARE_PROVIDER_SITE_OTHER): Payer: Self-pay | Admitting: Internal Medicine

## 2024-08-06 VITALS — BP 176/100 | HR 96 | Temp 98.1°F | Ht 66.0 in | Wt 311.0 lb

## 2024-08-06 DIAGNOSIS — R0600 Dyspnea, unspecified: Secondary | ICD-10-CM | POA: Insufficient documentation

## 2024-08-06 DIAGNOSIS — R0602 Shortness of breath: Secondary | ICD-10-CM | POA: Insufficient documentation

## 2024-08-06 DIAGNOSIS — R053 Chronic cough: Secondary | ICD-10-CM

## 2024-08-06 DIAGNOSIS — Z79899 Other long term (current) drug therapy: Secondary | ICD-10-CM

## 2024-08-06 LAB — PULMONARY FUNCTION TEST (COMBINED)
DL/VA- %Pred: 112 %
DL/VA- Actual: 4.77 ml/min/mmHg/L
DL/VA- Pred: 4.25 ml/min/mmHg/L
DLCOcor (Lower Limit of Normal): 17.85 ml/min/mmHg
DLCOcor- %Pred: 82 %
DLCOcor- Actual: 18.61 ml/min/mmHg
DLCOcor- Pred: 22.66 ml/min/mmHg
DLCOunc (Lower Limit of Normal): 17.85 ml/min/mmHg
DLCOunc- %Pred: 83 %
DLCOunc- Actual: 19 ml/min/mmHg
DLCOunc- Pred: 22.66 ml/min/mmHg
ERV (Pre-Bronch) %Pred: 62 %
ERV (Pre-Bronch) Actual: 0.82 L
ERV (Pre-Bronch) Pred: 1.31 L
ERVSB (Pre-Actual): 0.82 L
ERVSVC (Lower Limit of Normal): 0.59 L
FEF 25-75% (Pre-Bronch) %Pred: 80 %
FEF 25-75% (Pre-Bronch) Actual: 2.56 L/s
FEF 25-75% (Pre-Bronch) Pred: 3.19 L/s
FEF Max (Pre-Bronch) %Pred: 86 %
FEF Max (Pre-Bronch) Actual: 4.97 L/s
FEF Max (Pre-Bronch) Pred: 5.75 L/s
FEF2575 (Lower Limit of Normal): 1.89 L/s
FEF2575 (Standard Deviation): 0.89 L/s
FEFMax (Lower Limit of Normal): 3.89 L/s
FEFMax (Standard Deviation): 1.13 L/s
FEV1 (Lower Limit of Normal): 2.53 L
FEV1 (Pre-Bronch) %Pred: 73 %
FEV1 (Pre-Bronch) Actual: 2.35 L
FEV1 (Pre-Bronch) Pred: 3.2 L
FEV1 (Standard Deviation): 0.4 L
FEV1/FVC (Pre-Bronch) %Pred: 103 %
FEV1/FVC (Pre-Bronch) Actual: 84 %
FEV1/FVC (Pre-Bronch) Pred: 81 %
FRC (N2) (Pre-Bronch) %Pred: 69 %
FRC (N2) (Pre-Bronch) Actual: 2.04 L
FRC (N2) (Pre-Bronch) Pred: 2.95 L
FRCN2 (Lower Limit of Normal): 2.09 L
FVC (Lower Limit of Normal): 3.15 L
FVC (Pre-Bronch) %Pred: 70 %
FVC (Pre-Bronch) Actual: 2.81 L
FVC (Pre-Bronch) Pred: 3.97 L
FVC (Standard Deviation): 0.51 L
IC (Pre-Bronch) %Pred: 66 %
IC (Pre-Bronch) Actual: 1.87 L
IC (Pre-Bronch) Pred: 2.81 L
ICSVC (Lower Limit of Normal): 1.93 L
ICSVC (Upper Limit of Normal): 3.7 L
ICTLCSB (Pre-Actual): 46 %
PEF (Pre-Actual): 298.5 L/min
RVSB (Pre-Actual): 1.22 L
RVTLCSB (Pre-Actual): 30 %
SVCSB (Pre-Actual): 2.84 L
TLC (N2) (Pre-Bronch) %Pred: 68 %
TLC (N2) (Pre-Bronch) Actual: 3.91 L
TLC (N2) (Pre-Bronch) Pred: 5.75 L
TLCN2 (Lower Limit of Normal): 4.71 L
VA (% Predicted-Pre): 73 %
VA (Lower Limit of Normal): 4.39 L
VA (Pre-Actual): 3.9 L
VA- Pred: 5.33 L

## 2024-08-06 MED ORDER — AZELASTINE-FLUTICASONE 137-50 MCG/ACT NA SUSP: 1.0000 | Freq: Two times a day (BID) | NASAL | 5 refills | Status: DC

## 2024-08-06 NOTE — Progress Notes (Signed)
 08/06/24 0759   Spirometry Pre and/or Post   Procedure Location and Type Diagnostic - Pre Only   Pre FVC 2.81 L   Pre FEV1 2.35 L   Pre FEV1/FVC % (Calculated) 84   Patient Effort Good   Adverse Reactions None   Lung Volumes   Method Nitrogen wash out   Procedure status Completed   Adverse Reactions None   DLCO   Procedure status Completed   Adverse Reactions None

## 2024-08-06 NOTE — Progress Notes (Unsigned)
 [place] [facility]    Patient Name: Hailey [name], Hailey [name]    Date of Visit:  08/06/2024  Date of Birth: Aug 10, 1981  AGE: 43 y.o.  Medical Record #: [id number]  Requesting Physician: Sherlean Ferrier, MD    HISTORY OF PRESENT ILLNESS:  Hailey [name]  is a 44 y.o.  female who presents for new patient evaluation of recent PNA. She has a Hx of Behet's disease, WPW, seizure disorder.     Initial Consult on 08/06/2024  The patient was in her usual state of health until developing a sore throat and cough in July 2025.  She was seen at [facility] 05/2024 and diagnosed with PNA based on CXR. She was treated with Augmentin , z pack and an inhaler because of wheezing. She notes some chest congestion at that time but felt that this was mild. She had no fever or chills. She notes that this is also around the time her Humira  started to lose efficacy and she was dealing with neuro Behcet's sx. She feels that the PNA cleared up fairly quickly. She stopped albuterol  after about a week as the cough and wheezing improved.     Since her PNA, she feels that she still has a cough associated with throat clearing which has not improved. She has attributed this to allergies but is not sure. She notes some hoarseness with voice cracking which is abnormal for her which has been present since her PNA. She still has some dyspnea with stairs that is worse than usual.    She has no Hx of asthma or COPD. She has been on albuterol  in the past following a respiratory illness but has not been on a maintenance inhaler. She has environmental allergies with negative testing. She has OAS for tropical fruits and latex. She notes that when her allergies are flaring, she has not noticed increased dyspnea or wheezing. She does have difficulty with tropical fruits.     She has used Afrin to help with congestion for sleeping. She has trialed Flonase in the past.     She does have difficulty with being active and ADLs but this is not due to  her breathing.     She notes that 2024 was a difficult year health wise. She had neuro Behcet sx followed by a change in her biologic. She was diagnosed with Behcet in 2022 with sx which started in 2019. She had ongoing low grade fevers, fatigue, joint pain. She had a ganglion cyst followed by strep infection. She had mouth sores which lasted weeks at a time. In 2022 she had a surgery on her wrist which did not heal correctly which prompted the genetic testing for Behcet.     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 She is now on Remicaid as of 01/2024. She felt that the Humira  initially heped. She is on MTX to prevent tolerance to Remicaid due to loss of efficacy with the Humira . She is on Otezla  as well which has helped her mouth sores. 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     She has occasional swelling in her left ankle. She is being considered for orthopedic surgery. She was told that it is not pitting edema. For her RA, she has pain in her SI joints and ambulates with a cane. She also notes pain in her hands and knees.     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 Her last ECHO in 2023 was normal.     Her parents were both smokers as a child. She is adopted.     She is a lifelong nonsmoker. She denies hookah or vaping. She has no unusual exposures. No pets.         MEDICATIONS:  Current Medications[1]      PHYSICAL EXAM:  Vitals:    08/06/24 1015   BP: (!) 176/100   Pulse: 96   Temp: 98.1 F (36.7 C)   SpO2: 96%      Physical Exam  Constitutional:       Appearance: Normal appearance. She is normal weight.   HENT:      Head: Normocephalic and atraumatic.      Nose: Nose normal.   Eyes:      Pupils: Pupils are equal, round, and reactive to light.   Cardiovascular:      Rate and Rhythm: Normal rate and  regular rhythm.   Pulmonary:      Effort: Pulmonary effort is normal.      Breath sounds: Normal breath sounds.   Musculoskeletal:         General: Normal range of motion.      Cervical back: Normal range of motion.   Skin:     General: Skin is warm and dry.   Neurological:      Mental Status: She is alert.         PULMONARY DIAGNOSTICS:        PFT 08/06/24: FVC: 2.81 (70%), FEV1: 2.35 (73%), FEV1/FVC: 83%, TLC:: 3.91 (68%), DLCO: 19.00 (83%) -- Mild restriction        LABS:        IMAGING:    CXR 05/31/24: Lungs are clear. The prior faint opacity in the right lung base is not appreciated on the current film. There is no significant peribronchial  cuffing. No lobar consolidation, effusion, or pneumothorax. No acute osseous or soft tissue abnormality.    CXR 05/22/24: .Possible trace right pleural effusion. Right basilar opacity which could represent atelectasis and/or pneumonia.      IMPRESSION:  Hailey [name] is a 43 y.o. female with the following problems:     PNA   Treated with Augmentin  and Z pack from UC.   She seems to have recovered mostly. She still has an occasional cough and hoarseness.   Post treatment CXR on 05/30/2024 shows improvement in mild RLL infiltrate    Cough  Post infectious rhinitis   Likely due to post infectious rhinitis vs mild post infectious bronchospasm.   Lungs were clear on exam. Fair amount of postnasal drip on exam.   She may benefit from Dymista.     Behchet's  Baseline PFT showed mild restriction   Will proceed with CT chest for baseline imaging.     RECOMMENDATIONS:  Start fluticasone-azelastine nasal spray 1-2x daily until better   Check HRCT chest  Check full pulmonary function testing  Increase activity     No follow-ups on file.                                               No orders of the defined types were placed in this encounter.  A total of 60 minutes was spent on this visit reviewing previous notes, counseling the patient and/or family members regarding the patient's  condition(s), ordering of tests, managing medications, and documenting the findings in the note.      I personally scribed for Oneil JINNY Lang, MD  on  08/06/2024 The documentation recorded by the scribe, Mardy Pry, accurately reflects the service I personally performed and the decisions made by me, Oneil JINNY Lang, MD        SIGNED:    Deborh Pense         [1]   Current Outpatient Medications:     Apremilast  (Otezla ) 30 MG Tablet, Take 1 tablet (30 mg) by mouth 2 (two) times daily, Disp: 180 tablet, Rfl: 1    botulinum toxin type A  (BOTOX ) 200 units injection, INJECT 155 UNITS INTRAMUSCULARLY EVERY 12 WEEKS, Disp: 1 each, Rfl: 3    busPIRone  (BUSPAR ) 5 MG tablet, Take 1 tablet (5 mg) by mouth 2 (two) times daily, Disp: 180 tablet, Rfl: 3    clindamycin  (CLEOCIN  T) 1 % lotion, Apply topically once every morning, Disp: , Rfl:     colesevelam  (WELCHOL ) 625 MG tablet, TAKE 2 TABLETS (1,250 MG) BY MOUTH EVERY MORNING, Disp: 180 tablet, Rfl: 3    divalproex , ER, extended release (DEPAKOTE  ER) 500 MG 24 hr tablet, Take 1 tablet (500 mg) by mouth 3 (three) times daily, Disp: 270 tablet, Rfl: 3    eletriptan  (RELPAX ) 40 MG tablet, Take 1 tablet (40 mg) by mouth as needed for Migraine .  Wait at least 2 hours prior to taking second dose.  Max 2 tabs/24 hours. Do not use more than 3 days per week., Disp: 12 tablet, Rfl: 5    EPINEPHrine  (EPIPEN  2-PAK) 0.3 MG/0.3ML Solution Auto-injector injection, Inject 0.3 mLs (0.3 mg) into the muscle once as needed (Anaphylaxis), Disp: 2 each, Rfl: 2    folic acid  (FOLVITE ) 1 MG tablet, Take 1 tablet (1,000 mcg) by mouth once daily (Patient taking differently: Take 1 tablet (1,000 mcg) by mouth once daily), Disp: 90 tablet, Rfl: 1    Fremanezumab -vfrm (Ajovy ) 225 MG/1.5ML Solution Auto-injector, Inject 1 pen into the skin every 30 (thirty) days, Disp: 1.5 mL, Rfl: 11    hydrOXYzine  (ATARAX ) 10 MG tablet, TAKE 1 TABLET BY MOUTH EVERY DAY NIGHTLY, Disp: 90 tablet, Rfl: 3    inFLIXimab  100  MG injection, Infuse into the vein once every eight weeks, Disp: , Rfl:     Lacosamide  (VIMPAT ) 200 MG Tablet, Take 1 tablet (200 mg) by mouth 2 (two) times daily, Disp: 180 tablet, Rfl: 1    lidocaine -prilocaine (EMLA) cream, Apply topically as needed Dermatology procedures, Disp: , Rfl:     Melatonin-Pyridoxine (MELATIN PO), Take 4 mg by mouth once at bedtime, Disp: , Rfl:     methocarbamol  (ROBAXIN ) 750 MG tablet, Take 1 tablet (750 mg) by mouth 4 (four) times daily, Disp: 30 tablet, Rfl: 2    methotrexate  2.5 MG tablet, Take 4 tablets (10 mg) by mouth once a week, Disp: 48 tablet, Rfl: 1    Multiple Vitamin (MULTIVITAMIN PO), Take by mouth once daily, Disp: , Rfl:     Naltrexone HCl, Pain, (Naltrex) 1.5 MG Cap, Take by mouth, Disp: , Rfl:     omeprazole  (PriLOSEC) 40 MG capsule, Take 1 capsule (40 mg) by mouth once daily In the morning 30 mins before eating (Patient taking differently: Take 1 capsule (40 mg) by mouth once daily In the  morning 30 mins before eating), Disp: 90 capsule, Rfl: 1    ondansetron  (ZOFRAN -ODT) 8 MG disintegrating tablet, DISSOLVE 1 TABLET (8 MG) IN THE MOUTH EVERY 8 (EIGHT) HOURS AS NEEDED FOR NAUSEA, Disp: 90 tablet, Rfl: 3    predniSONE  (DELTASONE ) 10 MG tablet, Take 1 tablet (10 mg) by mouth once daily (Patient taking differently: Take 1 tablet (10 mg) by mouth once daily), Disp: 30 tablet, Rfl: 5    Rimegepant Sulfate  75 MG Tablet Dispersible, TAKE 1 TABLET (75 MG) BY MOUTH AS NEEDED (MIGRAINE), Disp: 16 each, Rfl: 11    valACYclovir  (VALTREX ) 1000 MG tablet, Take 2 tabs at onset; repeat once in 12 hours.  Total 4 tabs per outbreak, Disp: 12 tablet, Rfl: 3    vitamin B-12 1000 MCG tablet, Take 1 tablet (1,000 mcg) by mouth daily, Disp: 30 tablet, Rfl: 0    VITAMIN D  PO, Take 1 tablet by mouth once daily Dosage unsure, Disp: , Rfl:     albuterol  sulfate HFA (PROVENTIL ) 108 (90 Base) MCG/ACT inhaler, Inhale 2 puffs into the lungs every 4 (four) hours as needed for Wheezing (Patient not  taking: Reported on 08/06/2024), Disp: 1 each, Rfl: 0    Dapsone 7.5 % Gel, Apply topically once every morning (Patient not taking: Reported on 08/06/2024), Disp: , Rfl:     diazePAM  (VALIUM ) 5 MG tablet, Take 1 tablet (5 mg) by mouth every 6 (six) hours as needed for Anxiety (take 1/2 hour prior to the MRI), Disp: 5 tablet, Rfl: 0    dicyclomine  (BENTYL ) 20 MG tablet, TAKE 1 TABLET (20 MG) BY MOUTH EVERY 6 (SIX) HOURS, Disp: 360 tablet, Rfl: 3    Current Facility-Administered Medications:     onabotulinumtoxin A (BOTOX ) injection 175 Units, 175 Units, Intramuscular, Q12 Weeks, Kastl, Charlotte M, MD, 175 Units at 06/11/24 1112

## 2024-08-07 LAB — INFLIXIMAB LEVEL AND ANTI-DRUG ANTIBODY FOR IBD
Infliximab Antibody: 16 [AU] — ABNORMAL HIGH (ref <10)
Infliximab Level: 6.1 ug/mL

## 2024-08-08 ENCOUNTER — Encounter: Payer: Self-pay | Admitting: Internal Medicine

## 2024-08-08 DIAGNOSIS — M352 Behcet's disease: Secondary | ICD-10-CM

## 2024-08-08 DIAGNOSIS — D84821 Immunodeficiency due to drugs: Secondary | ICD-10-CM

## 2024-08-08 DIAGNOSIS — M0600 Rheumatoid arthritis without rheumatoid factor, unspecified site: Secondary | ICD-10-CM

## 2024-08-08 DIAGNOSIS — R569 Unspecified convulsions: Secondary | ICD-10-CM

## 2024-08-08 DIAGNOSIS — H209 Unspecified iridocyclitis: Secondary | ICD-10-CM

## 2024-08-13 ENCOUNTER — Encounter (INDEPENDENT_AMBULATORY_CARE_PROVIDER_SITE_OTHER): Payer: BLUE CROSS/BLUE SHIELD | Admitting: Internal Medicine

## 2024-08-13 DIAGNOSIS — Z79899 Other long term (current) drug therapy: Secondary | ICD-10-CM

## 2024-08-13 DIAGNOSIS — M352 Behcet's disease: Secondary | ICD-10-CM

## 2024-08-13 NOTE — Progress Notes (Signed)
 Patient messaged the following:    Hi Dr. Woodard - I have been scheduled for surgery on November 6th to fix the soft tissue issue on the outside of my left ankle that we have been dealing with since January of 2024. My surgeon (Dr. Myrle, [facility]) is requesting clearance from you and primary care. I tried moving up the current November 4th appointment with you, since it would be so close in time, but there is nothing sooner available. Do you need to see me to provide clearance or are you comfortable with doing so based on my last appointment in August?     I've had no changes, other than that I received a Covid and flu vaccine last weekend. If you are comfortable with clearing me, could you advise how to handle my Behet's meds prior to surgery?     Thank you,     Hailey [name]    Recommendations:  -Follow up with PCP for overal surgical clearance and cardiac risk stratification  -In terms of behcets meds, typically we recommend timing surgery towards end og Remicade  cycle and hold immediate dose post -op until there is good wound healing seen  -She can continue Methotrexate  and Otezla  through surgery        Based on cumulative time spent addressing messages for an established patient up to 7 days, online digital evaluation and management service for 5 minutes has been performed.

## 2024-08-16 ENCOUNTER — Other Ambulatory Visit: Payer: Self-pay

## 2024-08-20 ENCOUNTER — Other Ambulatory Visit: Payer: Self-pay

## 2024-08-20 ENCOUNTER — Ambulatory Visit
Admission: RE | Admit: 2024-08-20 | Discharge: 2024-08-20 | Disposition: A | Payer: BLUE CROSS/BLUE SHIELD | Source: Ambulatory Visit | Attending: Internal Medicine

## 2024-08-20 DIAGNOSIS — Z79899 Other long term (current) drug therapy: Secondary | ICD-10-CM | POA: Insufficient documentation

## 2024-08-20 DIAGNOSIS — R0602 Shortness of breath: Secondary | ICD-10-CM | POA: Insufficient documentation

## 2024-08-21 ENCOUNTER — Other Ambulatory Visit: Payer: Self-pay

## 2024-08-21 NOTE — Progress Notes (Signed)
 Specialty Pharmacy Refill Note    Hailey [name] is a 43 y.o. female, who is being followed by [facility] team for management of: RxSp Migraine (Enrolled) for the following services:  Refill Management  Benefits and PA Management  Refill Management  Clinical Management    Medications monitored by Specialty Pharmacy:   Fremanezumab -vfrm (Ajovy ) 225 MG/1.5ML Solution Auto-injector   Inject 1 pen into the skin every 30 (thirty) days    botulinum toxin type A  (BOTOX ) 200 units injection   INJECT 155 UNITS INTRAMUSCULARLY EVERY 12 WEEKS    Rimegepant Sulfate  75 MG Tablet Dispersible   TAKE 1 TABLET (75 MG) BY MOUTH AS NEEDED (MIGRAINE)      Refill Coordination  Medications Linked to Program: Fremanezumab -vfrm (Ajovy )  HIPAA verified (patient name & dob or patient name & address) with approved contact: Yes  HIPAA verification: please specify: Christy L. Mehaffey  How has _______ (specialty medication) helped you manage your _______ (condition) from very well to very poor: Well  Changes to allergies?: No  Changes to medications, herbals or supplements?: No  New conditions (diagnosis) since last pharmacy outreach: No  Since the last fill, has the patient experienced any unplanned office visit, urgent care, emergency room, or hospital admission: No  Since the last fill, any new or worsened side effects?: No  Financial problems or insurance changes : No  Since the last fill, has the patient missed any doses of their specialty medication : No  Since your last fill, how many doses of your specialty medications were missed?: 0  Doses left of specialty medications: 0  Does the patient have the correct number of remaining doses: Yes  Patient confirmations: received welcome packet, patient bill of rights, & privacy practices: Patient has received all  Patient confirmation: patient previously received the drug monograph(s) for their specialty medication(s): Yes    Delivery Information  Delivery confirmation:  signature required for delivery: No  Delivery method: [place]  Postal Service  Delivery confirmation: delivery address: Delivery Address Reviewed & Accurate  Enter delivery address: [address] [place], [place]. [address]  Delivery phone number: [phone]  Delivery confirmation: patient informed of and confirms delivery date. Enter Delivery date: : 08/27/24  Preferred time?: Anytime  Number of medications in delivery: 1  Is there any medication that is due not being filled?: No  Supplies needed?: No supplies needed  Does patient have any concerns about safely storing medications (at the correct temperature, away from children/pets,etc.)?: No  Do any medications need mixed or dated?: No  Financial confirmation: patient informed of financial responsibility and copay amount: Yes  Financial responsibility: copay amount: $0  Copay form of payment: Credit card on file  Questions or concerns for the pharmacist?: No  Are any medications first time fills?: No  Additional assessment notes : Ajovy  225mg  ( 1.5/30/$0)        Medicare Part B Fill? No    Marti Acebo

## 2024-08-22 ENCOUNTER — Other Ambulatory Visit: Payer: Self-pay

## 2024-08-23 ENCOUNTER — Other Ambulatory Visit: Payer: Self-pay

## 2024-08-26 ENCOUNTER — Other Ambulatory Visit: Payer: Self-pay

## 2024-08-27 ENCOUNTER — Ambulatory Visit (INDEPENDENT_AMBULATORY_CARE_PROVIDER_SITE_OTHER): Payer: Self-pay | Admitting: Internal Medicine

## 2024-08-28 ENCOUNTER — Other Ambulatory Visit: Payer: Self-pay

## 2024-08-28 NOTE — Progress Notes (Signed)
 Specialty Pharmacy Refill Note    Hailey [name] is a 43 y.o. female, who is being followed by [facility] Specialty Pharmacy team for management of: RxSp Migraine (Enrolled) for the following services:  Refill Management  Benefits and PA Management  Refill Management  Clinical Management    Medications monitored by Specialty Pharmacy:   Fremanezumab -vfrm (Ajovy ) 225 MG/1.5ML Solution Auto-injector   Inject 1 pen into the skin every 30 (thirty) days    botulinum toxin type A  (BOTOX ) 200 units injection   INJECT 155 UNITS INTRAMUSCULARLY EVERY 12 WEEKS    Rimegepant Sulfate  75 MG Tablet Dispersible   TAKE 1 TABLET (75 MG) BY MOUTH AS NEEDED (MIGRAINE)      Refill Coordination  Medications Linked to Program: Fremanezumab -vfrm (Ajovy )  HIPAA verified (patient name & dob or patient name & address) with approved contact: Yes  HIPAA verification: please specify: Patient via phone  How has _______ (specialty medication) helped you manage your _______ (condition) from very well to very poor: Well  Changes to allergies?: No  Changes to medications, herbals or supplements?: No  New conditions (diagnosis) since last pharmacy outreach: No  Since the last fill, has the patient experienced any unplanned office visit, urgent care, emergency room, or hospital admission: No  Since the last fill, any new or worsened side effects?: No  Financial problems or insurance changes : No  Since the last fill, has the patient missed any doses of their specialty medication : No  Doses left of specialty medications: 0  Does the patient have the correct number of remaining doses: Yes  Patient confirmations: received welcome packet, patient bill of rights, & privacy practices: Patient has received all  Patient confirmation: patient previously received the drug monograph(s) for their specialty medication(s): No    Delivery Information  Delivery confirmation: signature required for delivery: No  Delivery method: Courier  Delivery confirmation:  delivery address: Delivery Address Reviewed & Accurate  Enter delivery address: [address] [place], [place]. [address]  Delivery phone number: [phone]  Delivery confirmation: patient informed of and confirms delivery date. Enter Delivery date: : 08/30/24  Preferred time?: Anytime  Number of medications in delivery: 1  Is there any medication that is due not being filled?: No  Supplies needed?: No supplies needed  Does patient have any concerns about safely storing medications (at the correct temperature, away from children/pets,etc.)?: No  Do any medications need mixed or dated?: No  Financial confirmation: patient informed of financial responsibility and copay amount: Yes  Financial responsibility: copay amount: $0  Copay form of payment: Credit card on file  Questions or concerns for the pharmacist?: No  Are any medications first time fills?: No  Additional assessment notes : ajovy  (1.5/30/$0)        Medicare Part B Fill? No    Morna Crumb

## 2024-08-28 NOTE — Progress Notes (Signed)
 Specialty Pharmacy Refill Note    Jeree Delcid is a 43 y.o. female, who is being followed by [facility] Specialty Pharmacy team for management of: RxSp Migraine (Enrolled) for the following services:  Refill Management  Benefits and PA Management  Refill Management  Clinical Management    Medications monitored by Specialty Pharmacy:   Fremanezumab -vfrm (Ajovy ) 225 MG/1.5ML Solution Auto-injector   Inject 1 pen into the skin every 30 (thirty) days    botulinum toxin type A  (BOTOX ) 200 units injection   INJECT 155 UNITS INTRAMUSCULARLY EVERY 12 WEEKS    Rimegepant Sulfate  75 MG Tablet Dispersible   TAKE 1 TABLET (75 MG) BY MOUTH AS NEEDED (MIGRAINE)      Refill Coordination  Medications Linked to Program: Rimegepant Sulfate   HIPAA verified (patient name & dob or patient name & address) with approved contact: Yes  HIPAA verification: please specify: Patient via phone  How has _______ (specialty medication) helped you manage your _______ (condition) from very well to very poor: Well  Changes to allergies?: No  Changes to medications, herbals or supplements?: No  New conditions (diagnosis) since last pharmacy outreach: No  Since the last fill, has the patient experienced any unplanned office visit, urgent care, emergency room, or hospital admission: No  Since the last fill, any new or worsened side effects?: No  Financial problems or insurance changes : No  Since the last fill, has the patient missed any doses of their specialty medication : No  Doses left of specialty medications: 5  Does the patient have the correct number of remaining doses: Yes  Patient confirmations: received welcome packet, patient bill of rights, & privacy practices: Patient has received all  Patient confirmation: patient previously received the drug monograph(s) for their specialty medication(s): Yes    Delivery Information  Delivery confirmation: signature required for delivery: No  Delivery method: [employer]Ex  Delivery confirmation: delivery  address: Delivery Address Reviewed & Accurate  Enter delivery address: [address] [place], [place]. [address]  Delivery phone number: [phone]  Delivery confirmation: patient informed of and confirms delivery date. Enter Delivery date: : 08/30/24  Preferred time?: Anytime  Number of medications in delivery: 1  Is there any medication that is due not being filled?: No  Supplies needed?: No supplies needed  Does patient have any concerns about safely storing medications (at the correct temperature, away from children/pets,etc.)?: No  Do any medications need mixed or dated?: No  Financial confirmation: patient informed of financial responsibility and copay amount: Yes  Financial responsibility: copay amount: $0  Copay form of payment: Credit card on file  Questions or concerns for the pharmacist?: No  Are any medications first time fills?: No  Additional assessment notes : Nurtec (16/30/$0 with CN)        Medicare Part B Fill? No    Morna Crumb

## 2024-08-29 ENCOUNTER — Ambulatory Visit (INDEPENDENT_AMBULATORY_CARE_PROVIDER_SITE_OTHER): Payer: BLUE CROSS/BLUE SHIELD

## 2024-08-29 ENCOUNTER — Encounter (INDEPENDENT_AMBULATORY_CARE_PROVIDER_SITE_OTHER): Payer: Self-pay

## 2024-08-29 ENCOUNTER — Other Ambulatory Visit: Payer: Self-pay

## 2024-08-29 VITALS — BP 138/77 | HR 95 | Temp 98.2°F | Ht 66.54 in | Wt 314.8 lb

## 2024-08-29 DIAGNOSIS — M352 Behcet's disease: Secondary | ICD-10-CM

## 2024-08-29 DIAGNOSIS — G43109 Migraine with aura, not intractable, without status migrainosus: Secondary | ICD-10-CM

## 2024-08-29 DIAGNOSIS — D72829 Elevated white blood cell count, unspecified: Secondary | ICD-10-CM

## 2024-08-29 DIAGNOSIS — M25572 Pain in left ankle and joints of left foot: Secondary | ICD-10-CM

## 2024-08-29 DIAGNOSIS — D84821 Immunodeficiency due to drugs: Secondary | ICD-10-CM

## 2024-08-29 DIAGNOSIS — M461 Sacroiliitis, not elsewhere classified: Secondary | ICD-10-CM

## 2024-08-29 DIAGNOSIS — F419 Anxiety disorder, unspecified: Secondary | ICD-10-CM

## 2024-08-29 DIAGNOSIS — R569 Unspecified convulsions: Secondary | ICD-10-CM

## 2024-08-29 DIAGNOSIS — Z8679 Personal history of other diseases of the circulatory system: Secondary | ICD-10-CM

## 2024-08-29 DIAGNOSIS — R2242 Localized swelling, mass and lump, left lower limb: Secondary | ICD-10-CM

## 2024-08-29 DIAGNOSIS — M25472 Effusion, left ankle: Secondary | ICD-10-CM

## 2024-08-29 DIAGNOSIS — Z01818 Encounter for other preprocedural examination: Secondary | ICD-10-CM

## 2024-08-29 LAB — LAB USE ONLY - CBC WITH DIFFERENTIAL
Absolute Basophils: 0.05 x10 3/uL (ref 0.00–0.08)
Absolute Eosinophils: 0.14 x10 3/uL (ref 0.00–0.44)
Absolute Immature Granulocytes: 0.12 x10 3/uL — ABNORMAL HIGH (ref 0.00–0.07)
Absolute Lymphocytes: 6.21 x10 3/uL — ABNORMAL HIGH (ref 0.42–3.22)
Absolute Monocytes: 1.05 x10 3/uL — ABNORMAL HIGH (ref 0.21–0.85)
Absolute Neutrophils: 7.21 x10 3/uL — ABNORMAL HIGH (ref 1.10–6.33)
Absolute nRBC: 0 x10 3/uL (ref <=0.00)
Basophils %: 0.3 %
Eosinophils %: 0.9 %
Hematocrit: 43 % (ref 34.7–43.7)
Hemoglobin: 14.1 g/dL (ref 11.4–14.8)
Immature Granulocytes %: 0.8 %
Lymphocytes %: 42 %
MCH: 33.7 pg — ABNORMAL HIGH (ref 25.1–33.5)
MCHC: 32.8 g/dL (ref 31.5–35.8)
MCV: 102.9 fL — ABNORMAL HIGH (ref 78.0–96.0)
MPV: 8.8 fL — ABNORMAL LOW (ref 8.9–12.5)
Monocytes %: 7.1 %
Neutrophils %: 48.9 %
Platelet Count: 352 x10 3/uL — ABNORMAL HIGH (ref 142–346)
Preliminary Absolute Neutrophil Count: 7.21 x10 3/uL — ABNORMAL HIGH (ref 1.10–6.33)
RBC: 4.18 x10 6/uL (ref 3.90–5.10)
RDW: 13 % (ref 11–15)
WBC: 14.78 x10 3/uL — ABNORMAL HIGH (ref 3.10–9.50)
nRBC %: 0 /100{WBCs} (ref <=0.0)

## 2024-08-29 LAB — BASIC METABOLIC PANEL
Anion Gap: 7 (ref 5.0–15.0)
BUN: 15 mg/dL (ref 7–21)
CO2: 27 meq/L (ref 17–29)
Calcium: 9.9 mg/dL (ref 8.5–10.5)
Chloride: 101 meq/L (ref 99–111)
Creatinine: 0.9 mg/dL (ref 0.4–1.0)
GFR: >60 mL/min/1.73 m2 (ref >=60.0)
Glucose: 78 mg/dL (ref 70–100)
Hemolysis Index: 8 {index}
Potassium: 4.8 meq/L (ref 3.5–5.3)
Sodium: 135 meq/L (ref 135–145)

## 2024-08-29 LAB — LAB USE ONLY - MORPHOLOGY REVIEW
Platelet Estimate: INCREASED — AB
RBC Morphology: NORMAL

## 2024-08-29 NOTE — Progress Notes (Signed)
 Have you seen any specialists/other providers since your last visit with us ?    Yes, ortho and pulmonologist      The patient was informed that the following HM items are still outstanding:   Health Maintenance Due   Topic Date Due    Tetanus Ten-Year  11/08/2023

## 2024-08-29 NOTE — Progress Notes (Signed)
 [facility]                       Date of Exam: 08/29/2024 12:07 PM        Patient ID: Hailey [name] is a 43 y.o. female.  Attending Physician: Kaelin E Jennison, DNP FNP        Chief Complaint:    Pre-op Exam (Excision of left interior ankle mass/patient is FASTING today /)               HPI:    HPI    Behcet's disease: taking methotrexate  through Rheum Dr. Woodard  She reports chronic low grade temp in evenings--this has been for long time and symptoms are stable    Two Neurologists (one for headaches and one for seizure like symptoms) Dr. [name] manages Vimpat  and Depakote .  Denies recent seizure like activity. She only takes diazepam  for MRIs.    Plans to stop naltrexone few days prior to surgery and hold following surgery as directed by Orthopedic Surgical team.    Requires Rheum clearance for surgery. Will follow up with them for clearance and medication instructions for methotrexate /prednisone  with upcoming surgery     Limited gait/exercise due to ankle pain/swelling and SI joint pain. Walks with cane intermittent.     Seen by pulm 08/06/24: s/p PNA. Repeat CXR 05/31/24 showed resolution. No recent respiratory symptoms.     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 S/p 2 ablations. Last echo 2023: no significant findings, EF biplane 60%. Reports she no longer sees Cardiology given symptoms resolved and most recent echo with no significant findings.     Visit Type: Pre-operative Evaluation  Procedure: excision of left anterolateral ankle mass  Date of Surgery: 09/12/2024  Surgeon: Dr. Alm Melnick  Fax Number (Required): [phone]  Chief Complaint: left ankle pain and mass  Recent Health (admits): no current complaints  Recent Health (denies): fever, fatigue, chest pain, cough, nausea, vomiting, diarrhea, dyspnea, dysuria, urinary frequency, abdominal pain, easy bruising, LE swelling, and poor exercise tolerance  Exercise Tolerance: 5 met ( i.e. dancing )--limited exercise due to  ankle pain and SI joint. Denies chest pain/shortness of breath with exertion  Surgical Risk Factors: No active cardiac/pulmonary conditions. She has   Prior Anesthesia: Patient reports adverse reaction to anesthesia in the past consisting of the following symptoms: nausea--improves with zofran  prior to anesthesia              Problem List:    Problem List[1]          Current Meds:    Current Medications[2]       Allergies:    Allergies[3]          Past Surgical History:    Past Surgical History[4]        Family History:    Family History[5]        Social History:    Social History[6]        The following sections were reviewed this encounter by the provider:   Tobacco  Allergies  Meds  Problems  Med Hx  Surg Hx  Fam Hx             Vital Signs:    BP 138/77 (BP Site: Left arm, Patient Position: Sitting, Cuff Size: Large)   Pulse 95   Temp 98.2 F (36.8 C) (Temporal)   Ht 1.69 m (5' 6.54)   Wt (!) 142.8 kg (314 lb 12.8 oz)  LMP  (LMP Unknown)   SpO2 96%   BMI 50.00 kg/m          ROS:    Review of Systems   Constitutional:  Negative for activity change, appetite change, fatigue, fever and unexpected weight change.   HENT:  Negative for congestion, sore throat and trouble swallowing.    Eyes:  Negative for visual disturbance.   Respiratory:  Negative for cough and shortness of breath.    Cardiovascular:  Negative for chest pain, palpitations and leg swelling.   Gastrointestinal:  Negative for abdominal pain, diarrhea, nausea and vomiting.   Genitourinary:  Negative for dysuria.   Musculoskeletal:  Positive for arthralgias.   Skin:  Negative for rash.   Allergic/Immunologic: Negative for environmental allergies and food allergies.   Neurological:  Positive for headaches (headache disorder at baseline). Negative for dizziness, seizures (denies recent seizure like activity), syncope, weakness (left hand grip < right hand grip but this is baseline) and light-headedness.   Hematological:  Does not  bruise/bleed easily.              Physical Exam:    Physical Exam  Constitutional:       General: She is not in acute distress.     Appearance: Normal appearance. She is obese. She is not ill-appearing.   HENT:      Head: Normocephalic and atraumatic.      Right Ear: Tympanic membrane, ear canal and external ear normal.      Left Ear: Tympanic membrane, ear canal and external ear normal.      Nose: Nose normal.      Mouth/Throat:      Mouth: Mucous membranes are moist.      Pharynx: Oropharynx is clear.   Eyes:      Extraocular Movements: Extraocular movements intact.      Pupils: Pupils are equal, round, and reactive to light.   Cardiovascular:      Rate and Rhythm: Normal rate and regular rhythm.      Pulses: Normal pulses.      Heart sounds: Normal heart sounds. No murmur heard.  Pulmonary:      Effort: Pulmonary effort is normal. No respiratory distress.      Breath sounds: Normal breath sounds. No wheezing.   Abdominal:      General: Abdomen is flat. Bowel sounds are normal. There is no distension.      Palpations: Abdomen is soft.      Tenderness: There is no abdominal tenderness.   Musculoskeletal:         General: Swelling (left ankle) present. Normal range of motion.      Right hand: Normal strength. Normal sensation.      Left hand: Decreased strength (slight asymmetric strength to left hand grap compared to right hand grip (this is her reported baseline r/t Behcet's disease)). Normal sensation.      Cervical back: Normal range of motion and neck supple.      Right lower leg: No edema.      Left lower leg: No edema.      Comments: Normal strength    Lymphadenopathy:      Cervical: No cervical adenopathy.   Skin:     General: Skin is warm and dry.      Capillary Refill: Capillary refill takes less than 2 seconds.   Neurological:      General: No focal deficit present.      Mental Status: She is alert and  oriented to person, place, and time.      Cranial Nerves: No facial asymmetry.      Motor: No weakness.       Coordination: Romberg sign negative.      Gait: Gait abnormal (SI joint and left ankle pain/swelling--intermittent cane use).      Deep Tendon Reflexes:      Reflex Scores:       Patellar reflexes are 2+ on the right side and 2+ on the left side.  Psychiatric:         Mood and Affect: Mood normal.         Behavior: Behavior normal.              Assessment:    1. Pre-op examination  - CBC with Differential (Order); Future  - Basic Metabolic Panel; Future  - ECG 12 lead  - CBC with Differential (Order)  - Basic Metabolic Panel    2. Left ankle swelling    3. Left ankle pain, unspecified chronicity    4. Mass of ankle, left    5. Behcet's disease (CMS/HCC)    6. Leukocytosis, unspecified type    7. History of Wolff-Parkinson-White (WPW) syndrome    8. Immunocompromised state due to drug therapy    9. Seizure (CMS/HCC)    10. Anxiety    11. Migraine with aura and without status migrainosus, not intractable    12. Morbid obesity (CMS/HCC)    13. Sacroiliitis            Plan:    Preoperative Evaluation:  Surgery Specific Risk:  Low (endoscopic, superficial, breast, opthalmologic, minor orthopedic)  ASA Classification: Class 3 - Severe systemic disease, limits normal activity.    EKG Findings:   normal sinus rhythm, nonspecific ST and T waves changes  Preoperative Status:  Disease state: Exercise tolerance is greater than 4 mets.  Limited exercise due to SI joint pain and left ankle pain--denies chest pain/shortness of breaht  Recommendations:  ASA - Pt adivsed to stop ASA at least numbers: 7 days prior to surgery. NSAID's - Pt advised to stop NSAID at least numbers: 7 days prior to surgery. COX-2 inhibitors - Pt advised to stop COX-2 inhibitor therapy at least numbers: 7 days prior to surgery. Herbal supplements - Pt advised to stop all herbals and supplements at least one week before surgery. DMARD/TNF therapy - Defer peri-operative management recommendations to rheumatology. Defer to rheumatology regarding holding  prednisone  prior to surgery. Confirm with Orthopedist regarding holding naltrexone prior to surgery. Hold Benzodiazepines (PRN use for MRI per patient) prior to surgery and discuss with surgeon.   Pt does have the ability to perform > 4 MET levels of activity, has no active cardiac conditions and may proceed with surgery with no additional cardiac testing or procedures. Pt is at intermediate risk for peri-operative cardiovascular complications during planned low risk surgical procedure.    Plans to follow up with Rheumatology regarding additional clearance.     Hx WPW S/P ablation. No more symptoms. Most recent echo with normal EF.     Leukocytosis: persistent leukocytosis. She reports chronic low grade fever (up to 100 degree F in evenings) with no additional systemic symptoms or signs of infection.           Follow-up:    No follow-ups on file.         Kaelin E Jennison, DNP FNP                     [  1]   Patient Active Problem List  Diagnosis    Morbid obesity (CMS/HCC)    Herpes labialis    Non-seasonal allergic rhinitis, unspecified trigger    Migraine with aura and without status migrainosus, not intractable    Post-cholecystectomy syndrome    History of Wolff-Parkinson-White (WPW) syndrome    Scoliosis deformity of spine    Behcet recurrent disease (CMS/HCC)    Immunocompromised state due to drug therapy    S/P hysterectomy    Premature ventricular contractions (PVCs) (VPCs)    Palpitations    NICM (nonischemic cardiomyopathy) (CMS/HCC)    PVC's (premature ventricular contractions)    Abdominal pain, unspecified abdominal location    Seronegative rheumatoid arthritis (CMS/HCC)    Anterior uveitis    Generalized abdominal pain    Speech abnormality    Post-dural puncture headache    Diarrhea, unspecified type    Nausea    Altered bowel habits    High risk medication use    Seizure (CMS/HCC)    Anxiety   [2]   Current Outpatient Medications   Medication Sig Dispense Refill    Apremilast  (Otezla ) 30 MG Tablet  Take 1 tablet (30 mg) by mouth 2 (two) times daily 180 tablet 1    Azelastine-Fluticasone 137-50 MCG/ACT Suspension 1 spray by Nasal route 2 (two) times daily 23 g 5    botulinum toxin type A  (BOTOX ) 200 units injection INJECT 155 UNITS INTRAMUSCULARLY EVERY 12 WEEKS 1 each 3    busPIRone  (BUSPAR ) 5 MG tablet Take 1 tablet (5 mg) by mouth 2 (two) times daily 180 tablet 3    clindamycin  (CLEOCIN  T) 1 % lotion Apply topically once every morning      colesevelam  (WELCHOL ) 625 MG tablet TAKE 2 TABLETS (1,250 MG) BY MOUTH EVERY MORNING 180 tablet 3    diazePAM  (VALIUM ) 5 MG tablet Take 1 tablet (5 mg) by mouth every 6 (six) hours as needed for Anxiety (take 1/2 hour prior to the MRI) 5 tablet 0    dicyclomine  (BENTYL ) 20 MG tablet TAKE 1 TABLET (20 MG) BY MOUTH EVERY 6 (SIX) HOURS (Patient taking differently: Take 1 tablet (20 mg) by mouth as needed) 360 tablet 3    divalproex , ER, extended release (DEPAKOTE  ER) 500 MG 24 hr tablet Take 1 tablet (500 mg) by mouth 3 (three) times daily 270 tablet 3    eletriptan  (RELPAX ) 40 MG tablet Take 1 tablet (40 mg) by mouth as needed for Migraine .  Wait at least 2 hours prior to taking second dose.  Max 2 tabs/24 hours. Do not use more than 3 days per week. 12 tablet 5    EPINEPHrine  (EPIPEN  2-PAK) 0.3 MG/0.3ML Solution Auto-injector injection Inject 0.3 mLs (0.3 mg) into the muscle once as needed (Anaphylaxis) 2 each 2    folic acid  (FOLVITE ) 1 MG tablet Take 1 tablet (1,000 mcg) by mouth once daily 90 tablet 1    Fremanezumab -vfrm (Ajovy ) 225 MG/1.5ML Solution Auto-injector Inject 1 pen into the skin every 30 (thirty) days 1.5 mL 11    hydrOXYzine  (ATARAX ) 10 MG tablet TAKE 1 TABLET BY MOUTH EVERY DAY NIGHTLY 90 tablet 3    inFLIXimab  100 MG injection Infuse into the vein once every eight weeks      Lacosamide  (VIMPAT ) 200 MG Tablet Take 1 tablet (200 mg) by mouth 2 (two) times daily 180 tablet 1    lidocaine -prilocaine (EMLA) cream Apply topically as needed Dermatology  procedures      Melatonin-Pyridoxine (  MELATIN PO) Take 4 mg by mouth once at bedtime      methocarbamol  (ROBAXIN ) 750 MG tablet Take 1 tablet (750 mg) by mouth 4 (four) times daily (Patient taking differently: Take 1 tablet (750 mg) by mouth as needed) 30 tablet 2    methotrexate  2.5 MG tablet Take 4 tablets (10 mg) by mouth once a week 48 tablet 1    Multiple Vitamin (MULTIVITAMIN PO) Take by mouth once daily      Naltrexone HCl, Pain, (Naltrex) 1.5 MG Cap Take by mouth      omeprazole  (PriLOSEC) 40 MG capsule Take 1 capsule (40 mg) by mouth once daily In the morning 30 mins before eating 90 capsule 1    ondansetron  (ZOFRAN -ODT) 8 MG disintegrating tablet DISSOLVE 1 TABLET (8 MG) IN THE MOUTH EVERY 8 (EIGHT) HOURS AS NEEDED FOR NAUSEA 90 tablet 3    predniSONE  (DELTASONE ) 10 MG tablet Take 1 tablet (10 mg) by mouth once daily 30 tablet 5    Rimegepant Sulfate  75 MG Tablet Dispersible TAKE 1 TABLET (75 MG) BY MOUTH AS NEEDED (MIGRAINE) (Patient taking differently: as needed) 16 each 11    valACYclovir  (VALTREX ) 1000 MG tablet Take 2 tabs at onset; repeat once in 12 hours.  Total 4 tabs per outbreak (Patient taking differently: as needed Take 2 tabs at onset; repeat once in 12 hours.  Total 4 tabs per outbreak) 12 tablet 3    vitamin B-12 1000 MCG tablet Take 1 tablet (1,000 mcg) by mouth daily 30 tablet 0    VITAMIN D  PO Take 1 tablet by mouth once daily Dosage unsure       Current Facility-Administered Medications   Medication Dose Route Frequency Provider Last Rate Last Admin    onabotulinumtoxin A (BOTOX ) injection 175 Units  175 Units Intramuscular Q12 Weeks Kastl, Charlotte M, MD   175 Units at 06/11/24 1112   [3]   Allergies  Allergen Reactions    Fruit Blend Flavor [Flavoring Agent (Non-Screening)] Anaphylaxis and Swelling     Tropical Fruit     Pineapple Anaphylaxis    Latex Drug-Induced Flushing, Hives, Other (See Comments), Swelling, Itching and Rash    Cyanoacrylate Itching and Rash    Other Itching and  Rash     Sutures Monocryl    Oxycodone Hallucinations, Hives and Other (See Comments)     Hallucinations, took 1 time 25 years ago    Wound Dressing Adhesive Itching and Other (See Comments)     adhesive   [4]   Past Surgical History:  Procedure Laterality Date    ABDOMINAL SURGERY  2016; 2012    Umbilical hernia; hysterectomy    ABLATION - PVC'S N/A 06/08/2022    Procedure: Ablation - PVC's;  Surgeon: Von Oh, MD;  Location: [facility];  Service: Cardiovascular;  Laterality: N/A;  SAME DAY DISCHARGE  CARTO NO TEE    ARTHROTOMY, WRIST Right 12/03/2020    Procedure: RIGHT UPPER EXTREMITY SYNOVIAL BIOPSY;  Surgeon: Bolling Oh, MD;  Location: [facility];  Service: Plastics;  Laterality: Right;    BIOPSY, LYMPH NODE N/A 07/23/2018    Procedure: BIOPSY, LYMPH NODE;  Surgeon: Gloris Wilkie FALCON, MD;  Location: [id number];  Service: ENT;  Laterality: N/A;  NECK DEEP LYMPH NODE BIOPSY    BONE MARROW BIOPSY  2020    led to behcet's dx    CARDIAC ABLATION  2005    [facility]  at [facility] for Wolff-Parkinson-White    CARDIAC  CATHETERIZATION  2005    CHOLECYSTECTOMY  2009    COLONOSCOPY, DIAGNOSTIC (SCREENING) N/A 03/21/2023    Procedure: COLONOSCOPY, DIAGNOSTIC (SCREENING);  Surgeon: Leontine Countryman, MD;  Location: [facility];  Service: Gastroenterology;  Laterality: N/A;    DILATION AND CURETTAGE OF UTERUS  2010    EGD N/A 03/21/2023    Procedure: EGD;  Surgeon: Leontine Countryman, MD;  Location: [facility];  Service: Gastroenterology;  Laterality: N/A;    FINGER GANGLION CYST EXCISION  04/2018    FOOT SURGERY  2014    Ganglion cyst removal from Achilles    GANGLION CYST EXCISION  2014    04/2018 cyst removed from index finger-[facility]    HAND SURGERY  January 2022    HERNIA REPAIR  2016    HYSTERECTOMY  2012    HYSTEROSCOPY  2011    indication: Abnormal bleeding    LAPAROSCOPIC, LYSIS, ADHESIONS N/A 11/09/2022    Procedure: LAPAROSCOPIC LYSIS OF ADHESIONS;  Surgeon: Jan,  Ambareen G, MD;  Location: [facility];  Service: Gynecology;  Laterality: N/A;    LAPAROSCOPIC, SALPINGECTOMY Left 11/09/2022    Procedure: LAPAROSCOPIC, SALPINGECTOMY LEFT;  Surgeon: Madison Sedalia MATSU, MD;  Location: [facility];  Service: Gynecology;  Laterality: Left;    LAPAROSCOPY, DIAGNOSTIC N/A 11/09/2022    Procedure: LAPAROSCOPY, DIAGNOSTIC;  Surgeon: Madison Sedalia MATSU, MD;  Location:  [facility];  Service: Gynecology;  Laterality: N/A;  [id number]    UMBILICAL HERNIA REPAIR  2016    UPPER GASTROINTESTINAL ENDOSCOPY  2009    UPPER GASTROINTESTINAL ENDOSCOPY  2009    WISDOM TOOTH EXTRACTION  1998   [5]   Family History  Adopted: Yes   [6]   Social History  Tobacco Use    Smoking status: Never     Passive exposure: Never    Smokeless tobacco: Never   Vaping Use    Vaping status: Never Used   Substance Use Topics    Alcohol use: Never    Drug use: Never

## 2024-08-30 ENCOUNTER — Encounter (INDEPENDENT_AMBULATORY_CARE_PROVIDER_SITE_OTHER): Payer: Self-pay

## 2024-08-30 ENCOUNTER — Encounter: Payer: Self-pay | Admitting: Internal Medicine

## 2024-08-30 ENCOUNTER — Ambulatory Visit (INDEPENDENT_AMBULATORY_CARE_PROVIDER_SITE_OTHER): Payer: BLUE CROSS/BLUE SHIELD

## 2024-08-30 ENCOUNTER — Ambulatory Visit (INDEPENDENT_AMBULATORY_CARE_PROVIDER_SITE_OTHER): Payer: Self-pay

## 2024-08-30 VITALS — Ht 67.0 in | Wt 314.0 lb

## 2024-08-30 DIAGNOSIS — Z6841 Body Mass Index (BMI) 40.0 and over, adult: Secondary | ICD-10-CM

## 2024-08-30 NOTE — PSS Phone Screening (Addendum)
 Pre-Anesthesia Evaluation    Pre-op phone visit requested by:   Reason for pre-op phone visit: Patient anticipating EXCISION OF LEFT ANTEROLATERAL ANKLE MASS procedure.     08/29/2024/ CBC,CMP  08/29/2024 EKG  08/29/2024 LOV Hailey [name] for (pre-op clearance)  08/13/2024 LOV erica Mc[facility])  A1C ordered to be done at [facility] pt agreed   There is no other requested form surgeon per patient  08/12/2024 LOv Dr. Meryle (surgeon)    No orders of the defined types were placed in this encounter.      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   09/25/2020     History of Wolff-Parkinson-White (WPW) syndrome Z86.79   09/25/2020     Overview Signed 09/25/2020  1:30 PM by Keven Delon HERO, MD   Ablation 2005         Scoliosis deformity of spine M41.9   09/25/2020     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   02/12/2021     Immunocompromised state due to drug therapy D84.821   04/19/2021     S/P hysterectomy Z90.710   01/14/2022     Premature ventricular contractions (PVCs) (VPCs) I49.3   03/02/2022     Palpitations R00.2   03/02/2022     NICM (nonischemic cardiomyopathy) (CMS/HCC) I42.8   05/17/2022     PVC's (premature ventricular contractions) I49.3   06/08/2022     Abdominal pain, unspecified abdominal location R10.9   10/18/2022     Seronegative rheumatoid arthritis (CMS/HCC) M06.00   01/06/2023     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   01/06/2023     Generalized abdominal pain R10.84   01/10/2023     Speech abnormality R47.9   01/27/2023      Post-dural puncture headache G97.1   02/10/2023     Diarrhea, unspecified type R19.7   01/10/2023     Nausea R11.0   01/10/2023     Altered bowel habits R19.4   04/12/2023     High risk medication use Z79.899   04/12/2023     Seizure (CMS/HCC) R56.9   05/25/2023     Anxiety F41.9   09/25/2023         Medical History   Diagnosis Date    Abdominal hernia 2016    Resolved by surgical repair    Abnormal vision March 2024    reading glasses    Amenorrhea 1999    Resolved by hysterectomy    Anxiety     Arrhythmia     WPW ablation in 2005, 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 Released from cardiology    Atrial flutter (CMS/HCC) 20+ years    Previous history of WPW    Behcet's disease (CMS/HCC)     She sees Dr. Woodard    BMI 45.0-49.9, adult (CMS/HCC)     Brain concussion 2002    Claustrophobia 2019    Noticed in imaging  tests    Complication of anesthesia 2019    Convulsions (CMS/HCC) March 2024    02/11/23 stable on medical    Diarrhea     Dizziness 15+ years    Allergies, migraine    Ear, nose and throat disorder     seasonal allergies    Fatigue 2019    Bechet's    Fever 11/09/2022    Recurrent fever due to Behcet's Disease    Fracture of hand 2016    Right hand middle finger    Hard to intubate 2019    Headache 2002    migraine    History of adverse reaction to anesthesia 2005    Nausea    History of ulcer disease 2019    Bechets Disease    Hx of ovarian cyst     Lower back pain     not severe    Meningitis 2011    Viral // treated and resolved    Migraine 2002    Near syncope 1995    Previous history of WPW    Other physical therapy March 2023    Neck    Pain in joint 2019    RA part of Bechets Disease    Pain in wrist 11/09/2022    Palpitations 20+ years    Previous history of WPW    Post-operative nausea and vomiting 2005    Rash 2019    Behcets disease - controlled by medication    Rheumatoid arthritis (CMS/HCC) 2019    Part of Behcets Disease    Scoliosis 1985    moderate// used a Brace at a young age    Shingles 2015    Swelling of extremity  2019    Left ankle    Uveitis     using eyedrops     Past Surgical History[1]     Medication List            Accurate as of August 30, 2024 12:27 PM. Always use your most recent med list.                Ajovy  225 MG/1.5ML Soaj  Inject 1 pen into the skin every 30 (thirty) days  Generic drug: Fremanezumab -vfrm     Azelastine-Fluticasone 137-50 MCG/ACT Susp  1 spray by Nasal route 2 (two) times daily     Botox  200 units injection  INJECT 155 UNITS INTRAMUSCULARLY EVERY 12 WEEKS  Generic drug: botulinum toxin type A      busPIRone  5 MG tablet  Take 1 tablet (5 mg) by mouth 2 (two) times daily  Commonly known as: BUSPAR      clindamycin  1 % lotion  Apply topically once every morning  Commonly known as: CLEOCIN  T     colesevelam  625 MG tablet  TAKE 2 TABLETS (1,250 MG) BY MOUTH EVERY MORNING  Commonly known as: WELCHOL      cyanocobalamin  1000 MCG tablet  Take 1 tablet (1,000 mcg) by mouth daily     diazePAM  5 MG tablet  Take 1 tablet (5 mg) by mouth every 6 (six) hours as needed for Anxiety (take 1/2 hour prior to the MRI)  Commonly known as: VALIUM      dicyclomine  20 MG tablet  TAKE 1 TABLET (20 MG) BY MOUTH EVERY 6 (SIX) HOURS  Commonly known as: BENTYL      divalproex  (ER) extended release 500 MG 24 hr tablet  Take 1 tablet (500 mg) by mouth 3 (three) times daily  Commonly known as: DEPAKOTE   ER     eletriptan  40 MG tablet  Take 1 tablet (40 mg) by mouth as needed for Migraine .  Wait at least 2 hours prior to taking second dose.  Max 2 tabs/24 hours. Do not use more than 3 days per week.  Commonly known as: RELPAX      EPINEPHrine  0.3 MG/0.3ML Soaj injection  Inject 0.3 mLs (0.3 mg) into the muscle once as needed (Anaphylaxis)     folic acid  1 MG tablet  Take 1 tablet (1,000 mcg) by mouth once daily  Commonly known as: FOLVITE      hydrOXYzine  10 MG tablet  TAKE 1 TABLET BY MOUTH EVERY DAY NIGHTLY  Commonly known as: ATARAX      inFLIXimab  100 MG injection  Infuse into the vein once every eight weeks     Lacosamide  200 MG  Tabs  Take 1 tablet (200 mg) by mouth 2 (two) times daily  Commonly known as: VIMPAT      lidocaine -prilocaine cream  Apply topically as needed Dermatology procedures  Commonly known as: EMLA     MELATIN PO  Take 4 mg by mouth once at bedtime     methocarbamol  750 MG tablet  Take 1 tablet (750 mg) by mouth 4 (four) times daily  Commonly known as: ROBAXIN      methotrexate  2.5 MG tablet  Take 4 tablets (10 mg) by mouth once a week     MULTIVITAMIN PO  Take by mouth once daily     Naltrex 1.5 MG Caps  Take by mouth  Generic drug: Naltrexone HCl (Pain)     Nurtec 75 MG Tbdp  TAKE 1 TABLET (75 MG) BY MOUTH AS NEEDED (MIGRAINE)  Generic drug: Rimegepant Sulfate      omeprazole  40 MG capsule  Take 1 capsule (40 mg) by mouth once daily In the morning 30 mins before eating  Commonly known as: PriLOSEC     ondansetron  8 MG disintegrating tablet  DISSOLVE 1 TABLET (8 MG) IN THE MOUTH EVERY 8 (EIGHT) HOURS AS NEEDED FOR NAUSEA  Commonly known as: ZOFRAN -ODT     Otezla  30 MG Tabs  Take 1 tablet (30 mg) by mouth 2 (two) times daily  Generic drug: Apremilast      predniSONE  10 MG tablet  Take 1 tablet (10 mg) by mouth once daily  Commonly known as: DELTASONE      valACYclovir  1000 MG tablet  Take 2 tabs at onset; repeat once in 12 hours.  Total 4 tabs per outbreak  Commonly known as: VALTREX      VITAMIN D  PO  Take 1 tablet by mouth once daily Dosage unsure            Allergies[2]  Family History[3]  Social History     Occupational History    Occupation: Pensions Consultant    Tobacco Use    Smoking status: Never     Passive exposure: Never    Smokeless tobacco: Never   Vaping Use    Vaping status: Never Used   Substance and Sexual Activity    Alcohol use: Never    Drug use: Never    Sexual activity: Yes     Partners: Male     Birth control/protection: Surgical     Comment: Hysterectomy       Menstrual History:   LMP / Status  Hysterectomy     No LMP recorded (lmp unknown). Patient has had a hysterectomy.    Tubal Ligation?  No valid surgical or  medical questions entered.  Exam Scores:   SDB score           STBUR score       PONV score       MST score       PEN-FAST score       Frailty score       CHADsVasc            Visit Vitals  LMP  (LMP Unknown)   Obesity class 3 based on BMI.    Recent Labs   CBC (last 180 days) 07/31/24  0800 08/29/24  1003   WBC 11.95* 14.78*   RBC 4.24 4.18   Hemoglobin 14.1 14.1   Hematocrit 42.0 43.0   MCV 99.1* 102.9*   MCH 33.3 33.7*   MCHC 33.6 32.8   RDW 13 13   Platelet Count 295 352*   MPV 8.2* 8.8*   nRBC % 0.0 0.0   Absolute nRBC 0.00 0.00     Recent Labs   BMP (last 180 days) 07/31/24  0800 08/29/24  1003   Glucose 83 78   BUN 11 15   Creatinine 0.8 0.9   Sodium 141 135   Potassium 4.1 4.8   Chloride 106 101   CO2 24 27   Calcium  9.4 9.9   Anion Gap 11.0 7.0   GFR >60.0 >60.0         Recent Labs   Other (last 180 days) 06/05/24  1056 07/31/24  0800   Bilirubin, Total 0.4 0.5   ALT 50 29   AST (SGOT) 30 20   Protein, Total 7.2 7.0                        [1]   Past Surgical History:  Procedure Laterality Date    ABDOMINAL SURGERY  2016; 2012    Umbilical hernia; hysterectomy    ABLATION - PVC'S N/A 06/08/2022    Procedure: Ablation - PVC's;  Surgeon: Von Oh, MD;  Location: [facility];  Service: Cardiovascular;  Laterality: N/A;  SAME DAY DISCHARGE  CARTO NO TEE    ARTHROTOMY, WRIST Right 12/03/2020    Procedure: RIGHT UPPER EXTREMITY SYNOVIAL BIOPSY;  Surgeon: Bolling Oh, MD;  Location: [facility];  Service: Plastics;  Laterality: Right;    BIOPSY, LYMPH NODE N/A 07/23/2018    Procedure: BIOPSY, LYMPH NODE;  Surgeon: Gloris Wilkie FALCON, MD;  Location: [facility];  Service: ENT;  Laterality: N/A;  NECK DEEP LYMPH NODE BIOPSY    BONE MARROW BIOPSY  2020    led to behcet's dx    CARDIAC ABLATION  2005    [facility] for Wolff-Parkinson-White    CARDIAC CATHETERIZATION  2005    CHOLECYSTECTOMY  2009    COLONOSCOPY, DIAGNOSTIC (SCREENING) N/A 03/21/2023    Procedure:  COLONOSCOPY, DIAGNOSTIC (SCREENING);  Surgeon: Leontine Countryman, MD;  Location: [facility];  Service: Gastroenterology;  Laterality: N/A;    DILATION AND CURETTAGE OF UTERUS  2010    EGD N/A 03/21/2023    Procedure: EGD;  Surgeon: Leontine Countryman, MD;  Location: [facility];  Service: Gastroenterology;  Laterality: N/A;    FINGER GANGLION CYST EXCISION  04/2018    FOOT SURGERY  2014    Ganglion cyst removal from Achilles    GANGLION CYST EXCISION  2014    04/2018 cyst removed from index finger-[facility]    HAND SURGERY  January 2022  HERNIA REPAIR  2016    HYSTERECTOMY  2012    HYSTEROSCOPY  2011    indication: Abnormal bleeding    LAPAROSCOPIC, LYSIS, ADHESIONS N/A 11/09/2022    Procedure: LAPAROSCOPIC LYSIS OF ADHESIONS;  Surgeon: Jan, Ambareen G, MD;  Location: [facility];  Service: Gynecology;  Laterality: N/A;    LAPAROSCOPIC, SALPINGECTOMY Left 11/09/2022    Procedure: LAPAROSCOPIC, SALPINGECTOMY LEFT;  Surgeon: Madison Sedalia MATSU, MD;  Location: [facility];  Service: Gynecology;  Laterality: Left;    LAPAROSCOPY, DIAGNOSTIC N/A 11/09/2022    Procedure: LAPAROSCOPY, DIAGNOSTIC;  Surgeon: Madison Sedalia MATSU, MD;  Location: [facility];  Service: Gynecology;  Laterality: N/A;  [id number]    UMBILICAL HERNIA REPAIR  2016    UPPER GASTROINTESTINAL ENDOSCOPY  2009    UPPER GASTROINTESTINAL ENDOSCOPY  2009    WISDOM TOOTH EXTRACTION  1998   [2]   Allergies  Allergen Reactions    Fruit Blend Flavor [Flavoring Agent (Non-Screening)] Anaphylaxis and Swelling     Tropical Fruit     Pineapple Anaphylaxis    Latex Drug-Induced Flushing, Hives, Other (See Comments), Swelling, Itching and Rash    Cyanoacrylate Itching and Rash    Other Itching and Rash     Sutures Monocryl    Oxycodone Hallucinations, Hives and Other (See Comments)     Hallucinations, took 1 time 25 years ago    Wound Dressing Adhesive Itching and Other (See Comments)     adhesive   [3]   Family History  Adopted: Yes

## 2024-09-01 ENCOUNTER — Other Ambulatory Visit: Payer: Self-pay | Admitting: Neurology

## 2024-09-01 ENCOUNTER — Other Ambulatory Visit (INDEPENDENT_AMBULATORY_CARE_PROVIDER_SITE_OTHER): Payer: Self-pay | Admitting: Internal Medicine

## 2024-09-01 DIAGNOSIS — Z79899 Other long term (current) drug therapy: Secondary | ICD-10-CM

## 2024-09-01 DIAGNOSIS — H209 Unspecified iridocyclitis: Secondary | ICD-10-CM

## 2024-09-01 DIAGNOSIS — M352 Behcet's disease: Secondary | ICD-10-CM

## 2024-09-01 DIAGNOSIS — M0600 Rheumatoid arthritis without rheumatoid factor, unspecified site: Secondary | ICD-10-CM

## 2024-09-02 ENCOUNTER — Encounter (INDEPENDENT_AMBULATORY_CARE_PROVIDER_SITE_OTHER): Payer: Self-pay | Admitting: Family Medicine

## 2024-09-02 NOTE — Telephone Encounter (Addendum)
 LOV 06/2024  Follow up 09/2024

## 2024-09-02 NOTE — Telephone Encounter (Signed)
 Medication(s) requested:   Requested Prescriptions     Pending Prescriptions Disp Refills    methotrexate  2.5 MG tablet [Pharmacy Med Name: METHOTREXATE  2.5 MG TABLET] 24 tablet 1     Sig: TAKE 2 TABLETS (5 MG) BY MOUTH ONCE A WEEK        Last Visit: Aug 4,2025    FOLLOW UP:  Return in about 3 months (around 09/10/2024) for in person, 40 minutes.  Patient:  Has an upcoming appointment on Nov 4,2025  Last Labs:  Lab Results   Component Value Date    WBC 14.78 (H) 08/29/2024    HGB 14.1 08/29/2024    HCT 43.0 08/29/2024    PLT 352 (H) 08/29/2024    CHOL 226 (H) 01/23/2023    TRIG 228 (H) 01/23/2023    HDL 49 01/23/2023    LDL 131 (H) 01/23/2023    ALT 29 07/31/2024    AST 20 07/31/2024    NA 135 08/29/2024    K 4.8 08/29/2024    CL 101 08/29/2024    CREAT 0.9 08/29/2024    BUN 15 08/29/2024    CO2 27 08/29/2024    TSH 3.65 12/12/2023    INR 1.0 02/06/2023    GLU 78 08/29/2024    HGBA1C 5.4 01/28/2023

## 2024-09-03 ENCOUNTER — Other Ambulatory Visit: Payer: Self-pay

## 2024-09-03 LAB — HEMOGLOBIN A1C: Hemoglobin A1C: 5.4 % (ref 4.8–5.6)

## 2024-09-04 ENCOUNTER — Other Ambulatory Visit: Payer: Self-pay

## 2024-09-04 NOTE — Progress Notes (Signed)
 Specialty Pharmacy Refill Note    Hailey [name] is a 43 y.o. female, who is being followed by [facility] team for management of: RxSp Migraine (Enrolled) for the following services:  Refill Management  Benefits and PA Management  Refill Management  Clinical Management    Medications monitored by Specialty Pharmacy:   Fremanezumab -vfrm (Ajovy ) 225 MG/1.5ML Solution Auto-injector   Inject 1 pen into the skin every 30 (thirty) days    botulinum toxin type A  (BOTOX ) 200 units injection   INJECT 155 UNITS INTRAMUSCULARLY EVERY 12 WEEKS    Rimegepant Sulfate  75 MG Tablet Dispersible   TAKE 1 TABLET (75 MG) BY MOUTH AS NEEDED (MIGRAINE)      Refill Coordination  Medications Linked to Program: OnabotulinumtoxinA  (BOTOX )  HIPAA verified (patient name & dob or patient name & address) with approved contact: Yes  HIPAA verification: please specify: Self 04-Feb-1981  How has _______ (specialty medication) helped you manage your _______ (condition) from very well to very poor: Very Well  Changes to allergies?: No  Changes to medications, herbals or supplements?: No  New conditions (diagnosis) since last pharmacy outreach: No  Since the last fill, has the patient experienced any unplanned office visit, urgent care, emergency room, or hospital admission: No  Since the last fill, any new or worsened side effects?: No  Financial problems or insurance changes : No  Since the last fill, has the patient missed any doses of their specialty medication : No  Since your last fill, how many doses of your specialty medications were missed?: 0  Why were doses missed?: N/A  Doses left of specialty medications: 0  Does the patient have the correct number of remaining doses: Yes  Patient confirmations: received welcome packet, patient bill of rights, & privacy practices: Patient has received all  Patient confirmation: patient previously received the drug monograph(s) for their specialty medication(s): Yes    Delivery  Information  Delivery confirmation: signature required for delivery: Yes  Delivery method: Deliver to Clinic (Comment)  Delivery confirmation: delivery address: Delivery Address Reviewed & Accurate  Enter delivery address: [address] [place]  Delivery phone number: [phone]  Delivery confirmation: patient informed of and confirms delivery date. Enter Delivery date: : 09/10/24  Preferred time?: Anytime  Number of medications in delivery: 1  Is there any medication that is due not being filled?: No  Supplies needed?: No supplies needed  Does patient have any concerns about safely storing medications (at the correct temperature, away from children/pets,etc.)?: No  Do any medications need mixed or dated?: No  Financial confirmation: patient informed of financial responsibility and copay amount: Yes  Financial responsibility: copay amount: 0  Copay form of payment: Credit card on file  Questions or concerns for the pharmacist?: No  Are any medications first time fills?: No        Medicare Part B Fill? No    Hailey [name]

## 2024-09-06 ENCOUNTER — Encounter: Payer: Self-pay | Admitting: Internal Medicine

## 2024-09-06 ENCOUNTER — Other Ambulatory Visit: Payer: Self-pay

## 2024-09-08 ENCOUNTER — Encounter: Payer: Self-pay | Admitting: Family Nurse Practitioner

## 2024-09-08 ENCOUNTER — Encounter: Payer: Self-pay | Admitting: Neurology

## 2024-09-08 ENCOUNTER — Encounter (INDEPENDENT_AMBULATORY_CARE_PROVIDER_SITE_OTHER): Payer: Self-pay | Admitting: Internal Medicine

## 2024-09-09 ENCOUNTER — Other Ambulatory Visit: Payer: Self-pay

## 2024-09-09 ENCOUNTER — Encounter: Payer: Self-pay | Admitting: Neurology

## 2024-09-09 ENCOUNTER — Ambulatory Visit: Payer: BLUE CROSS/BLUE SHIELD | Attending: Neurology | Admitting: Neurology

## 2024-09-09 VITALS — BP 148/92 | HR 86 | Temp 97.8°F | Resp 12 | Wt 314.0 lb

## 2024-09-09 DIAGNOSIS — R413 Other amnesia: Secondary | ICD-10-CM

## 2024-09-09 DIAGNOSIS — M352 Behcet's disease: Secondary | ICD-10-CM

## 2024-09-09 DIAGNOSIS — R569 Unspecified convulsions: Secondary | ICD-10-CM

## 2024-09-09 DIAGNOSIS — M0600 Rheumatoid arthritis without rheumatoid factor, unspecified site: Secondary | ICD-10-CM

## 2024-09-09 MED ORDER — MODAFINIL 100 MG PO TABS: 50.0000 mg | ORAL_TABLET | Freq: Every morning | ORAL | 1 refills | Status: DC

## 2024-09-09 MED ORDER — LACOSAMIDE 200 MG PO TABS
200.0000 mg | ORAL_TABLET | Freq: Two times a day (BID) | ORAL | 1 refills | Status: AC
Start: 2024-09-09 — End: ?

## 2024-09-09 MED ORDER — CYANOCOBALAMIN 1000 MCG PO TABS
1000.0000 ug | ORAL_TABLET | Freq: Every day | ORAL | 0 refills | Status: AC
Start: 2024-09-09 — End: ?

## 2024-09-09 NOTE — Progress Notes (Signed)
 [facility]  MULT[facility]OIMMUNOLOGY [facility]  Telephone: [phone]  Fax: [phone]  Send us  a mychart or inbasket message for the fastest response  ~~~~~~~~~~~~~~~~~~~~~~~~~~~~~~~~~~~~~~~~~~~~~~~~~~~~~~~~~~~~~~~~~~~~~~~~~~~~~~~~~~~~  Visit Date: 09/09/2024    Referring Neurologist:     CC: neuro Behcet's     HPI:   History was obtained from records review, patient,     43 y.o. year-old female with  HLA-B51 pos Behcet's with neuro Behcet's (CSF matched OCB; neg brain mri).      SHe has 4 seizure like episodes a month on average. Lasting 30 seconds. Staring, some perseverative speech, headache. Not gtc's. Correlates with fatigue.    Energy is ok; some daytime sleepiness. Some difficulty with concentration but falls off after dinner time.     Gets headaches 4x/month.     Still some brief diplopia episodes , near daily.     Nausea is better; was related to strattera .      Overall memory is slowly improving with cognitive therapy. It is on hold and we will be able to restart it early next year.     She has ankle surgery Thursday.     EXAM  Visit Vitals  BP (!) 148/92 (BP Site: Left arm, Patient Position: Sitting, Cuff Size: Large)   Pulse 86   Temp 97.8 F (36.6 C) (Oral)   Resp 12   Wt (!) 142.4 kg (314 lb)   LMP  (LMP Unknown)   SpO2 98%   BMI 49.18 kg/m     General:   Optic Nerves:   Psychiatric.   Mental Status: The patient was awake, alert, appropriate  Cranial Nerves: CN II-XII wnl  Motor: 5/5  Reflex:   Sensation: mild BUE finger nose ataxia  Coordination:   Gait: normal. Tandems 10/10 steps well. Came up to clinic with a cane.       Imaging     Reports:  MRI Cervical Spine W WO Contrast  Result Date: 06/06/2024  Impression:  1.  No cervical cord signal abnormality or abnormal enhancement. 2.  Degenerative changes, greatest at C5-C6 with moderate right neural foraminal stenosis. No significant spinal canal stenosis. Electronically signed by: Gladis Minks M.D. [facility] MK: 06/06/24    MRI Brain W WO Contrast  Result Date: 06/06/2024  Impression:   No intracranial mass, acute hemorrhage, acute infarction, or evidence of hydrocephalus. Unremarkable MRI of the brain. Electronically signed by: Gladis Minks M.D. [facility] MK: 06/06/24    MRI Thoracic Spine W WO Contrast  Result Date: 06/05/2024  Impression:   1. Negative enhanced MR evaluation of the thoracic spinal cord and conus. 2. Mild generalized thoracic spondylosis without central stenosis. 3. Rotatory thoracic dextroscoliosis. 4. Otherwise unremarkable MR evaluation of the thoracic spinal column. No meaningful change in comparison to April 29, 2021. Electronically signed by: Sherlean Flank M.D. [facility] CM: 06/05/24       Testing       Labs  Lab Results   Component Value Date    HGBA1C 5.4 09/02/2024    EGFR >60.0 08/29/2024    CREAT 0.9 08/29/2024    WBC 14.78 (H) 08/29/2024    LYMPHOABS 2.5 07/25/2022    IGG 950 02/20/2023    IGA 216 01/25/2021    IGM 198 01/25/2021    B12 1,652 (H) 12/12/2023    VITD 39 12/12/2023         ASSESSMENT / PLAN  RTC: Return in about 3 months (  around 12/10/2024) for [name].    1. Behcet's disease (CMS/HCC)    2. Neurologic type Behcet syndrome (CMS/HCC)    3. Seronegative rheumatoid arthritis (CMS/HCC)    4. Seizure-like activity (CMS/HCC)    5. Memory loss    6. Seizure (CMS/HCC)      43 y.o. F with neuro-Behcet's (CSF matched OCB's; normal MRI's). Sporadic seizure-like episodes, few times a month, very brief and correlates w/ fatigue. Did not tolerate atomoxetine  due to nausea. Had tremors with 200mg  modafinil  in past; restart at 50mg .     Overall cognition is responding to  therapy.     Restart cognitive rehab when able    Clear for ankle surgery. She also has SI joint discomfort that impedes her walking.      PLAN  - Continue remicade  5mg /kg, mtx 10mg , prednisone  10mg , otezla  30mg  bid per rheum Dr Woodard  - Continue  prednisone  at current dose for now. In future, can consider slow reduction, eg 1 mg per week.   - may consider increasing remicade  dose if needed esp as levesl are low normal (last 6.1) and anti-inflx ab titers are low but increasing  - VPA 500mg  tid , LFT's improved with reduced dose.   - start modafinil  50mg  qam  - vimpat  200mg  bid  - vitamin d3 2000 units daily  - continue /restart cognitive rehab      Orders today (details below)  Orders Placed This Encounter   Procedures    Ambulatory referral to Speech Therapy     Orders Placed This Encounter   Medications    modafinil  (PROVIGIL ) 100 MG tablet     Sig: Take 0.5 tablets (50 mg) by mouth once every morning     Dispense:  30 tablet     Refill:  1    Lacosamide  (VIMPAT ) 200 MG Tablet     Sig: Take 1 tablet (200 mg) by mouth 2 (two) times daily     Dispense:  180 tablet     Refill:  1     This request is for a new prescription for a controlled substance as required by Federal/State law..    cyanocobalamin  1000 MCG tablet     Sig: Take 1 tablet (1,000 mcg) by mouth once daily     Dispense:  30 tablet     Refill:  0       I spent 40 minutes reviewing the records, talking with the patient and developing their care plan. This excludes time for separately billed procedures.      Hailey [name], MD PHD  Director, Neuroimmunology & MS Center  [facility]  [employer]  [address], [address], [place], [place] [address]    Telephone [phone]  Fax [phone]   The fastest response is via national city  ===================================================================  NFL (neurofilament light chain) Insurance Justification  Serum / plasma Neurofilament light chain is a well-established biomarker for monitoring MS, and regular testing has been incorporated into the Consortium of MS Centers [facility]) guidelines for the monitoring and clinical decision making for MS.     1)CMSC guidelines PDF:  https://mscare.http://www.stevens.com/     2) Homer MS, Zora GORMAN Britain RA, [place], [place], Kuhle J, Lycke J, Olsson T; Consortium of Multiple Sclerosis Centers. Guidance for use of neurofilament light chain as a cerebrospinal fluid and blood biomarker in multiple sclerosis management. [employer]. 2024 Mar;101:104970. doi: 10.1016/j.ebiom.7975.895029. Epub 2024 Feb 13. PMID: 61645467; PMCID: [id number].  PATIENT INSTRUCTIONS PROVIDED  Patient was given an After Visit Summary with a copy of the testing orders, medications and the following other instructions:  There are no Patient Instructions on file for this visit.    IMPORTED INFORMATION FROM MEDICAL RECORDS   Diagnosis ICD-10-CM Associated Order   1. Behcet's disease (CMS/HCC)  M35.2 Ambulatory referral to Speech Therapy     modafinil  (PROVIGIL ) 100 MG tablet      2. Neurologic type Behcet syndrome (CMS/HCC)  M35.2       3. Seronegative rheumatoid arthritis (CMS/HCC)  M06.00       4. Seizure-like activity (CMS/HCC)  R56.9       5. Memory loss  R41.3 Ambulatory referral to Speech Therapy     modafinil  (PROVIGIL ) 100 MG tablet      6. Seizure (CMS/HCC)  R56.9 Lacosamide  (VIMPAT ) 200 MG Tablet        Orders Placed This Encounter   Procedures    Ambulatory referral to Speech Therapy     Standing Status:   Future     Expected Date:   09/10/2024     Expiration Date:   09/09/2025     Referral Priority:   Routine     Referral Type:   Consultation     Referral Reason:   Services Requested     Requested Specialty:   Speech Language Pathology     Number of Visits Requested:   1

## 2024-09-10 ENCOUNTER — Other Ambulatory Visit: Payer: Self-pay

## 2024-09-10 ENCOUNTER — Encounter (INDEPENDENT_AMBULATORY_CARE_PROVIDER_SITE_OTHER): Payer: Self-pay | Admitting: Internal Medicine

## 2024-09-10 ENCOUNTER — Ambulatory Visit (INDEPENDENT_AMBULATORY_CARE_PROVIDER_SITE_OTHER): Payer: BLUE CROSS/BLUE SHIELD | Admitting: Internal Medicine

## 2024-09-10 DIAGNOSIS — M0600 Rheumatoid arthritis without rheumatoid factor, unspecified site: Secondary | ICD-10-CM

## 2024-09-10 DIAGNOSIS — K760 Fatty (change of) liver, not elsewhere classified: Secondary | ICD-10-CM

## 2024-09-10 DIAGNOSIS — M25572 Pain in left ankle and joints of left foot: Secondary | ICD-10-CM

## 2024-09-10 DIAGNOSIS — K120 Recurrent oral aphthae: Secondary | ICD-10-CM

## 2024-09-10 DIAGNOSIS — H209 Unspecified iridocyclitis: Secondary | ICD-10-CM

## 2024-09-10 DIAGNOSIS — M352 Behcet's disease: Secondary | ICD-10-CM

## 2024-09-10 DIAGNOSIS — G8929 Other chronic pain: Secondary | ICD-10-CM

## 2024-09-10 DIAGNOSIS — Z79899 Other long term (current) drug therapy: Secondary | ICD-10-CM

## 2024-09-10 MED ORDER — OTEZLA 30 MG PO TABS: 1.0000 | ORAL_TABLET | Freq: Two times a day (BID) | ORAL | 1 refills | Status: DC

## 2024-09-10 MED ORDER — FOLIC ACID 1 MG PO TABS
1000.0000 ug | ORAL_TABLET | Freq: Every day | ORAL | 1 refills | Status: AC
Start: 2024-09-10 — End: ?

## 2024-09-10 NOTE — Progress Notes (Signed)
 RHEUMATOLOGY FOLLOW UP NOTE        PCP: [name], MD    HPI:    This patient is a 43 y.o. year female with Past Medical History of Prior Cholecystectomy, Wold-Parkinson White, Prior Hysterectomy who returns for follow up of Behcet's HLAB51+, recurrent oral ulcers, pathergy, low grade fevers and seronegative arthritis, uveitis  -ablation procedure for arrythmia 06/2022  -Was on Humira -lost efficacy and developed uveitis  -Otezla  for recurrent aphthous ulcers  -She is on dapsone from Dermatology, clindamycin  at night for skin lesions  Dermatologist started her on dapsone-she has had a few scars that are healing-had injections from her dermatologist.  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 Avelina Aspen  Sees Dr. [name] for [facility]-she also developed seizures  MTX added to prevent tolerance to Infliximab  biosimilar given Humira  loss of efficacy and uveitis      Interval History:    History of Present Illness        Bari Elenie Coven is a 43 year old female with Behcet's disease, uveitis, and inflammatory arthritis who presents for follow-up.    She is scheduled for ankle surgery on Thursday and has significant swelling in the left ankle, described as 'huge'. She saw a lymphedema specialist, who told her it was not lymphedema. She is currently on Remicade , with her next dose due in two weeks, and is also taking prednisone , which is not to be tapered before surgery.    She experiences difficulty with gripping and opening things, attributing this to changes in barometric pressure. Her sacroiliac joints have been problematic, but low-dose naltrexone and injections have been somewhat effective. She takes a low dose of methotrexate , four pills once a week, and her liver function tests are normal.    Recent lab work includes a hemoglobin A1c of 5.4, creatinine of 0.9, and a WBC count of 14.78. In September, her ESR was 3, CRP was 0.1, and  infliximab  level was 16. A CT chest high-resolution scan from October showed no acute chest pathology but noted increased hepatic parenchymal density and scarring in the right middle lobe and lingula. An incidental finding of a calcified lesion in the left lower lobe was consistent with a granuloma or hematoma.    Her current medications include Otezla , methotrexate , and folic acid . She is also on muscle relaxers, which she uses cautiously to manage her sacroiliac joint pain.    Results  LABS  CRP: <0.1 (02/06/2024)  ESR: 3 (02/06/2024)  WBC: 11.19 (02/06/2024)  Hb: 15.2 (02/06/2024)  Hct: 44.3 (02/06/2024)  PLT: 285 (02/06/2024)  Cr: 0.9 (02/06/2024)  AST: 27 (02/06/2024)  ALT: 37 (02/06/2024)  Alkaline phosphatase: 43 (02/06/2024)  Infliximab  antibodies: low (02/06/2024)    RADIOLOGY  Lower extremity venous ultrasound: No DVT (03/07/2024)  Ankle MRI: Soft tissue swelling, acute inflammation in tendon, chronic tendon changes (12/2023)      LABS  CRP: <0.1 (06/05/2024)  ESR: 8 (06/05/2024)  Comprehensive metabolic panel: Creatinine 0.7, AST 30, ALT 50, Alkaline phosphatase 48 (06/05/2024)  CBC: WBC 10.6, Hemoglobin 14.4, Hematocrit 42.2, Platelet count 305 (06/05/2024)  Valproic acid : 10.3 (06/05/2024)    RADIOLOGY  MRI cervical spine: No cervical cord signal abnormality or abnormal enhancement. Degenerative changes greatest at C5-C6 with moderate right neuroforaminal stenosis. No significant spinal canal stenosis. (06/04/2024)  MRI brain: No intracranial mass, acute hemorrhage, acute infarction, or evidence of hydrocephalus. Unremarkable. (06/04/2024)  MRI thoracic spine: Negative enhanced MRI evaluation of the thoracic spinal  cord and conus. Mild generalized thoracic spondylosis without central stenosis. Rotatory thoracic dextroscoliosis. Otherwise unremarkable. (06/04/2024)      LABS  Hemoglobin A1c: 5.4 (09/02/2024)  Creatinine: 0.9 (09/02/2024)  WBC: 14.78 (09/02/2024)  Hemoglobin: 14.1  (09/02/2024)  Hematocrit: 43.0 (09/02/2024)  Platelet count: 352 (09/02/2024)  ESR: 3 (07/31/2024)  CRP: 0.1 (07/31/2024)  Valproic acid : 7.8 (07/31/2024)  Infliximab : 16 (07/31/2024)  AST: 20 (07/31/2024)  ALT: 29 (07/31/2024)  Alkaline phosphatase: 44 (07/31/2024)  WBC: 11.9 (07/31/2024)  Hemoglobin: 14.1 (07/31/2024)  Hematocrit: 42.0 (07/31/2024)  Platelet count: 295 (07/31/2024)    RADIOLOGY  CT chest high resolution: No acute chest pathology. Incidental increased hepatic parenchymal density concerning for metal deposition disorder. Scarring of right middle lobe, lingula, 2 cm oval lesion posterior to heart in left lower lobe mildly calcified consistent with granuloma or hematoma. No new lung infiltrate. (08/20/2024)  Left ankle x-ray: Ankle mortise well aligned. (08/12/2024)  CT neck soft tissue: Calcification adjacent to medial posterior aspect of right submandibular gland without stone or enlarged lymph nodes. (07/18/2024)            Previous Labs:  12/12/23  CMP AST/ALT 45/74  ESR wnl  CRP wnl  Infliximab  level 13  CBC WBC 14; H/H 15.5/45.9      Previous Labs:  10/17/23  CMP ALT 69  ESR wnl  CRP wnl  CBC WBC 10        Imaging Reports:  01/03/24  MRI left ankle.    Previous Imaging:  10/16/23  CT L Spine:  FINDINGS:   Scoliotic alignment is present. On sagittal imaging, there is no  significant listhesis. Vertebral body heights are maintained. There is no  fracture. There is no lytic or blastic lesion.     Endplate spurring is present throughout the lumbar spine. Varying degrees  of disc height loss are present. There is no obvious large disc bulge or  protrusion. Facet and spinous process hypertrophy noted.     The surrounding soft tissues are normal.     IMPRESSION:      1. No acute process, stable appearance.  2. No fracture.  3. Degenerative findings are present but there is no appreciable large disc  bulge or protrusion      Previous Labs:  08/22/23  CRP wnl  ESR wnl  CMP AST/ALT 02/08/81  Infliximab  Ab  14  Valproic Acid  9.0      Previous Labs:  06/26/23  Inflizimab level pending  Valproic Acid  level 75  CRP wnl  ESR wnl  CMP AST/ALT 53/93    Previous Labs:  05/01/23  Infliximab  level wnl  CRP wnl  ESR 14  CMP wnl  CBC wnl    Previous Imaging:  07/25/23:  FINDINGS:     Right ankle/foot: No significant joint effusion or synovitis or marginal  erosion is noted in the right ankle, midfoot, or forefoot. No tenosynovitis  noted.     Left ankle/foot: No significant joint effusion, synovitis, or marginal  erosion is noted in the left ankle, midfoot, or forefoot. No tenosynovitis  noted.     IMPRESSION:         1.No joint effusion, synovitis, or obvious joint erosion in either right or  left ankle/foot    Previous Imaging:  03/01/23  MRI Brain:  FINDINGS:      The brain parenchyma demonstrates no significant abnormality. There is no  abnormal parenchymal enhancement. No parenchymal mass lesion, acute  parenchymal hemorrhage, or acute infarction.  The ventricles, sulci, and cisterns are unremarkable in appearance.     The flow voids of the major intracranial vessels appear intact.     The bones and extracranial soft tissues are unremarkable.     IMPRESSION:      No intracranial mass, acute hemorrhage, acute infarction, or evidence of  hydrocephalus. Normal MRI of the brain    02/10/23:  Neck US :  Narrative & Impression   HISTORY: Neck swelling. Enlarged cervical lymph nodes.      COMPARISON: 01/28/2023     TECHNIQUE: Targeted soft tissue ultrasound of the area of concern, in the  area of both parotid glands and at level 1.     FINDINGS:   No enlarged or otherwise abnormal appearing lymph node, other mass, fluid  collection, or other focal abnormality. The parotid glands appear symmetric  and normal in echotexture with no discrete mass.     IMPRESSION:      Normal exam         Previous Labs:  03/21/23  Surgical pathology A.  GASTRIC BIOPSY:          MINIMAL SUPERFICIAL CHRONIC INACTIVE INFLAMMATION, OTHERWISE     UNREMARKABLE;  H. PYLORI NOT IDENTIFIED ON H&E STAIN        B.  COLON RANDOM BIOPS:     UNREMARKABLE COLONIC MUCOSA     03/06/23  CRP wnl  ESR 35  CMP wnl  CBC unremarkable    Previous Labs:  02/06/23  CRP wnl  ESR 24  CMP wnl  CBC WBC 10.96    Previous Labs:  11/25/22  Procalcitonin wnl  CMP wnl  CBC unremarkable      Previous Labs:  08/29/22  CBC platelets 350  BMP unremarkable  ESR wnl  CRP wnl      Previous Imaging:  12/16/22  MRI Lumbar Spine:  IMPRESSION:         1.  No acute osseous or soft tissue abnormality.  2.  Normal MR appearance of the distal spinal cord, conus medullaris and  cauda equina.  3.  Rotatory lumbar levoscoliosis.  4.  Lumbar spondylosis and facet arthrosis without central stenosis or  nerve root compression. Please see full details and segmental analysis  above.    CT Chest Angio:  11/23/22  FINDINGS:      LINES/TUBES: None.     LUNGS: No consolidation, edema or mass. There is linear atelectasis at the  lung bases.  PLEURA: No pleural effusions or pneumothorax.     HEART: Not enlarged.  MEDIASTINUM: No axillary, hilar or mediastinal lymphadenopathy.  PULMONARY ARTERIES: No acute pulmonary emboli.  AORTA: No aneurysm or dissection.     UPPER ABDOMEN: Hepatic steatosis. No acute abnormality.     BONES AND SOFT TISSUES: Unremarkable.     IMPRESSION:      No acute pulmonary embolism.    CT A/P  11/23/22:  FINDINGS:      LINES/TUBES: None.     LOWER THORAX: Bibasilar atelectasis.     LIVER/BILIARY TREE: The gallbladder is absent. There is hepatic steatosis.  No biliary dilation.  SPLEEN: No splenomegaly.  PANCREAS: No pancreatic mass or duct dilatation.     KIDNEYS/URETERS: No hydronephrosis, stones or solid mass lesions.  ADRENALS: No adrenal mass.  PELVIC ORGANS/BLADDER: No pelvic masses. The bladder is partially  distended.     PERITONEUM/RETROPERITONEUM: No free air or fluid.  LYMPH NODES: No lymphadenopathy.  VESSELS: No aortic aneurysm.     GI TRACT:  No bowel wall thickening or dilation. Normal appendix.      BONES AND SOFT TISSUES: There are degenerative changes in the spine. No  suspicious or destructive osseous lesion.     IMPRESSION:      No acute abnormality.    Previous Imaging:      09/19/22:  ECHO:  Left Ventricle    The left ventricle is normal in size. There is normal left ventricular  geometry. Left ventricular systolic function is normal with an ejection  fraction by Biplane Method of Discs of  60 %.  3D LVEF 58%. Left ventricular  segmental wall motion is normal. Left ventricular diastolic filling parameters  are consistent with Grade I diastolic dysfunction (impaired relaxation  pattern).  Right Ventricle    The right ventricular cavity size is normal in size. Normal right  ventricular systolic function.    08/27/22  MRI left Hip:  IMPRESSION:      1.Trace left hip joint effusion without significant synovitis. Tear of the  left anterosuperior acetabular labrum. Otherwise unremarkable left hip  joint.     2.Mild to moderate left distal gluteus minimus tendinosis and mild to  moderate left greater trochanteric bursitis with bursal edema. There is  mild left proximal hamstring tendinosis.      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     4.There has been interval enlargement of a left adnexal cyst since pelvic  ultrasound 03/18/2022, now measuring 3.3 cm. This could be further  evaluated with follow-up pelvic ultrasound.     5.Unchanged 3.8 cm cyst adjacent to the vaginal cuff post hysterectomy  since multiple prior exams favoring a benign etiology.      Previous Labs:  07/25/22  ESR 37  CRP wnl  CMP ALt 39  CBC wnl      Previous Imaging:  04/12/22  Cardiac MRI:  IMPRESSION:       1. No delayed gadolinium enhancement to suggest myocardial fibrosis or  scarring.  2. Normal right ventricular systolic function.  3. Qualitatively mild global left ventricular hypokinesis. Left ventricular  systolic function measures 32% and may be artifactually reduced secondary  to significant motion  artifact.    Previous Labs:  04/19/22  Throat culture no B-hemolytic strep  POCT flu A/B  Abbot -      Previous Labs:  01/14/22  CMP wnl  CRP wnl  ViT D 31  ESR wnl  Lipid panel wnl  TSH wnl      08/18/21  CBC wnl  CMP wnl  ESR wnl  CRP wnl    Previous Labs:  05/25/21  Quant Gold-  HepbSAb wnl  HepbSAg-  HepCAb-  HIV-        Previous Labs:  02/23/21  ESR wnl  CRP wnl  CMP ALT 42, AST normal    CRP wnl  ESR wnl  HLAB51 positive  IgE, IgA, IGG, IgM all wnl  CBC wnl    12/03/20  DIAGNOSIS:          A. Lesion, ulnar wrist, excision:     -Fibroadipose tissue and vasculature          B. Synovial tissue, biopsy:     -Fibroadipose tissue and synovium (see comment)          Comment:     Morphologic features typical of rheumatoid arthritis or infection are     not identified.    12/03/20  Bacterial Cultures: No Growth  Fungal Cultures: NG at  one week  AFB: No growth    11/20/20  ESR 50  CRP wnl  IgG subclasses wnl  ANA comprehensive panel negative        Previous Labs:    [facility] Labs:  10/23/20  C-ANCA, P-ANCA, MPO, PR3 all negative  Mitochondrial Ab-  ANA IFA-  Scl70-  Centromere-  RNP-  SMith-  TPPA Ab-      05/18/20  Ferritin 291  CMP AST/A:T 59/70  CBC elevated lymphocytes    02/06/20  MPO-  PR3-  SSA-  aCL IgM 14  aCL IgG, IgA -  Lupus anticoagulant -  B2IgG, IgM -  ACE wnl  CCP-  RF-  Vit D 31  TSH wnl  ESR wnl  CK wnl  C4 wnl  C3 wnl  CRP wnl    07/2019  ANA Screen-  LDH wnl    07/23/2018 lymph node biopsy:DIAGNOSIS:          Lymph node, deep right neck, excisional biopsy:     -Lymph node with non-specific reactive changes   Wound, AFB and Fungal cultures negative at this time    BONE MARROW 05/03/2019      BONE MARROW, LEFT POSTERIOR ILIAC CREST, CORE BIOPSY, CLOT SECTION,    ASPIRATE SMEAR AND TOUCH PREP:        - NORMOCELLULAR BONE MARROW (approximate50-60%) WITH TRILINEAGE HEMATOPOIESIS,  NORMAL MYELOID TO ERYTHROID RATIO AND ADEQUATE NUMBERS OF    MEGAKARYOCYTES WIT H UNREMARKABLE MORPHOLOGY        - A FEW SMALL REACTIVE  LYMPHOID AGGREGATES IDENTIFIED, AND MILDLY  INCREASED NUMBERS OF EOSINOPHILS        - PENDING CYTOGENETIC STUDIES OF BONE MARROW SAMPLE  - Remain pending as of 05/14/2019.        COMMENT:    The concurrent flow cytometry performed on the bone marrow aspirate  sample shows no evidence of immunophenotypic abnormalities      Imaging Reviewed:    08/16/20  MRI Brain:IMPRESSION:      1. No intracranial mass, hemorrhage, or hydrocephalus is detected.  2. There is normal pattern of enhancement of the brain parenchyma and  meningeal surfaces.    9/18 MRI Abdomen:  IMPRESSION:      1. Diffuse hepatic steatosis. No suspicious focal hepatic lesions.  2. No biliary ductal dilatation or choledocholithiasis.    02/03/20  MRI Pelvis:  IMPRESSION:         Cystic structure at the vaginal cuff, flank by both ovaries not  significantly changed in size compared to 2019. No abnormal internal  enhancement. This favors a benign process such as a peritoneal inclusion  cyst or a paraovarian cyst.    12/14/19 MRI R Hand:  IMPRESSION:      1.  No suspicious soft tissue mass, osseous mass, or masslike  enhancement in the right hand or wrist to correlate with the diffuse FDG  uptake on recent PET/CT.  2.  There is mild enhancing synovitis in the right wrist joint without  active joint erosion or bony destruction.  3.  A small dorsal ganglion cyst overlying the scapholunate interval.    12/06/19  Full Body PET Scan:    Hypermetabolic soft tissue mass in palmar R hand surface at base of the second digit-stable pulmonary nodulke 1.3 cm left lower lobe-recommend follow up Chest CT 3 months         The following sections were reviewed this encounter by the provider:   Tobacco  Allergies  Meds  Problems  Med Hx  Surg Hx  Fam Hx         PMH/PSH:  Past Medical History:   Diagnosis Date    Abdominal hernia 2016    Resolved by surgical repair    Abnormal vision March 2024    reading glasses    Amenorrhea 1999    Resolved by hysterectomy    Anxiety  11/07/18    Arrhythmia     WPW ablation in 2005, 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 Released from cardiology    Atrial flutter (CMS/HCC) 20+ years    Previous history of WPW    Behcet's disease (CMS/HCC)     She sees Dr. 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    02/11/23 stable on medical    Diarrhea     Dizziness 15+ years    Allergies, migraine    Ear, nose and throat disorder     seasonal allergies    Fatigue 2019    Bechet's    Fever 11/09/2022    Recurrent fever due to Behcets Disease    Fracture of hand 2016    Right hand middle finger    Hard to intubate 2019    Headache 2002    migraine    History of adverse reaction to anesthesia 2005    Nausea    History of ulcer disease 2019    Bechets Disease    Hx of ovarian cyst     Lower back pain     not severe    Meningitis 2011    Viral // treated and resolved    Migraine 2002    Near syncope 1995    Previous history of WPW    Other physical therapy March 2023    Neck    Pain in joint 2019    RA part of Bechets Disease    Pain in wrist 11/09/2022    Palpitations 20+ years    Previous history of WPW    Post-operative nausea and vomiting 2005    Rash 2019    Behcets disease - controlled by medication    Rheumatoid arthritis (CMS/HCC) 2019    Part of Behcets Disease    Scoliosis 1985    moderate// used a Brace at a young age    Shingles 2015    Swelling of extremity 2019    Left ankle    Uveitis     using eyedrops        Past Surgical History:   Procedure Laterality Date    ABDOMINAL SURGERY  2016; 2012; 2024    Umbilical hernia; hysterectomy    ABLATION - PVC'S N/A 06/08/2022    Procedure: Ablation - PVC's;  Surgeon: Von Oh, MD;  Location: [facility];  Service: Cardiovascular;  Laterality: N/A;  SAME DAY DISCHARGE  CARTO NO TEE    ARTHROTOMY, WRIST Right 12/03/2020    Procedure: RIGHT UPPER EXTREMITY SYNOVIAL BIOPSY;  Surgeon: Bolling Oh, MD;  Location: [facility];  Service: Plastics;  Laterality: Right;    BIOPSY, LYMPH NODE N/A 07/23/2018    Procedure: BIOPSY, LYMPH NODE;  Surgeon: Gloris Wilkie FALCON, MD;  Location: [facility];  Service: ENT;  Laterality: N/A;  NECK DEEP LYMPH NODE BIOPSY    BONE MARROW BIOPSY  2020  led to behcet's dx    CARDIAC ABLATION  2005    [facility] for Wolff-Parkinson-White    CARDIAC CATHETERIZATION  2005    CHOLECYSTECTOMY  2009    COLONOSCOPY, DIAGNOSTIC (SCREENING) N/A 03/21/2023    Procedure: COLONOSCOPY, DIAGNOSTIC (SCREENING);  Surgeon: Leontine Countryman, MD;  Location: [facility];  Service: Gastroenterology;  Laterality: N/A;    DILATION AND CURETTAGE OF UTERUS  2010    EGD N/A 03/21/2023    Procedure: EGD;  Surgeon: Leontine Countryman, MD;  Location: [facility];  Service: Gastroenterology;  Laterality: N/A;    EXCISION, SOFT TISSUE Left 09/12/2024    Procedure: EXCISION OF LEFT ANTEROLATERAL ANKLE MASS;  Surgeon: Myrle Lenis, MD;  Location: [facility];  Service: Orthopedics;  Laterality: Left;    FINGER GANGLION CYST EXCISION  04/2018    FOOT SURGERY  2014    Ganglion cyst removal from Achilles    GANGLION CYST EXCISION  2014    04/2018 cyst [facility] index finger-[facility]    HAND SURGERY  January 2022    HERNIA REPAIR  2016    HYSTERECTOMY  2012    HYSTEROSCOPY  2011    indication: Abnormal bleeding    LAPAROSCOPIC, LYSIS, ADHESIONS N/A 11/09/2022    Procedure: LAPAROSCOPIC LYSIS OF ADHESIONS;  Surgeon: Jan, Ambareen G, MD;  Location: [facility];  Service: Gynecology;  Laterality: N/A;    LAPAROSCOPIC, SALPINGECTOMY Left 11/09/2022    Procedure: LAPAROSCOPIC, SALPINGECTOMY LEFT;  Surgeon: Madison Sedalia MATSU, MD;  Location: [facility];  Service: Gynecology;  Laterality: Left;    LAPAROSCOPY, DIAGNOSTIC N/A 11/09/2022    Procedure: LAPAROSCOPY, DIAGNOSTIC;  Surgeon: Madison Sedalia MATSU, MD;  Location:  [facility];  Service: Gynecology;   Laterality: N/A;  [id number]    UMBILICAL HERNIA REPAIR  2016    UPPER GASTROINTESTINAL ENDOSCOPY  2009    UPPER GASTROINTESTINAL ENDOSCOPY  2009    WISDOM TOOTH EXTRACTION  1998        FH/SH:  Family History   Adopted: Yes       Social History     Tobacco Use    Smoking status: Never     Passive exposure: Never    Smokeless tobacco: Never   Vaping Use    Vaping status: Never Used   Substance Use Topics    Alcohol use: Never    Drug use: Never        Meds/ Allergies:  Outpatient Medications Marked as Taking for the 09/10/24 encounter (Office Visit) with Woodard Geni HERO, MD   Medication Sig Dispense Refill    Azelastine -Fluticasone  137-50 MCG/ACT Suspension 1 spray by Nasal route 2 (two) times daily 23 g 5    botulinum toxin type A  (BOTOX ) 200 units injection INJECT 155 UNITS INTRAMUSCULARLY EVERY 12 WEEKS 1 each 3    clindamycin  (CLEOCIN  T) 1 % lotion Apply topically once every morning      cyanocobalamin  1000 MCG tablet Take 1 tablet (1,000 mcg) by mouth once daily 30 tablet 0    dicyclomine  (BENTYL ) 20 MG tablet TAKE 1 TABLET (20 MG) BY MOUTH EVERY 6 (SIX) HOURS (Patient taking differently: Take 1 tablet (20 mg) by mouth as needed) 360 tablet 3    divalproex , ER, extended release (DEPAKOTE  ER) 500 MG 24 hr tablet Take 1 tablet (500 mg) by mouth 3 (three) times daily 270 tablet 3    Fremanezumab -vfrm (Ajovy ) 225 MG/1.5ML Solution Auto-injector Inject 1 pen into the  skin every 30 (thirty) days 1.5 mL 11    inFLIXimab  100 MG injection Infuse into the vein once every eight weeks      Lacosamide  (VIMPAT ) 200 MG Tablet Take 1 tablet (200 mg) by mouth 2 (two) times daily 180 tablet 1    lidocaine -prilocaine (EMLA) cream Apply topically as needed Dermatology procedures      Melatonin-Pyridoxine (MELATIN PO) Take 4 mg by mouth once at bedtime (Patient taking differently: Take 1 mg by mouth once at bedtime)      methotrexate  2.5 MG tablet TAKE 2 TABLETS (5 MG) BY MOUTH ONCE A WEEK (Patient taking differently: Take 4 tablets (10  mg) by mouth once a week) 24 tablet 1    modafinil  (PROVIGIL ) 100 MG tablet Take 0.5 tablets (50 mg) by mouth once every morning 30 tablet 1    Multiple Vitamin (MULTIVITAMIN PO) Take by mouth once daily      Naltrexone HCl, Pain, (Naltrex) 1.5 MG Cap Take by mouth      omeprazole  (PriLOSEC) 40 MG capsule Take 1 capsule (40 mg) by mouth once daily In the morning 30 mins before eating 90 capsule 1    predniSONE  (DELTASONE ) 10 MG tablet TAKE 1 TABLET BY MOUTH EVERY DAY 30 tablet 5    Rimegepant Sulfate  75 MG Tablet Dispersible TAKE 1 TABLET (75 MG) BY MOUTH AS NEEDED (MIGRAINE) 16 each 11    VITAMIN D  PO Take 1 tablet by mouth once daily Dosage unsure      [DISCONTINUED] Apremilast  (Otezla ) 30 MG Tablet Take 1 tablet (30 mg) by mouth 2 (two) times daily 180 tablet 1    [DISCONTINUED] busPIRone  (BUSPAR ) 5 MG tablet Take 1 tablet (5 mg) by mouth 2 (two) times daily 180 tablet 3    [DISCONTINUED] colesevelam  (WELCHOL ) 625 MG tablet TAKE 2 TABLETS (1,250 MG) BY MOUTH EVERY MORNING 180 tablet 3    [DISCONTINUED] diazePAM  (VALIUM ) 5 MG tablet Take 1 tablet (5 mg) by mouth every 6 (six) hours as needed for Anxiety (take 1/2 hour prior to the MRI) 5 tablet 0    [DISCONTINUED] eletriptan  (RELPAX ) 40 MG tablet Take 1 tablet (40 mg) by mouth as needed for Migraine .  Wait at least 2 hours prior to taking second dose.  Max 2 tabs/24 hours. Do not use more than 3 days per week. 12 tablet 5    [DISCONTINUED] EPINEPHrine  (EPIPEN  2-PAK) 0.3 MG/0.3ML Solution Auto-injector injection Inject 0.3 mLs (0.3 mg) into the muscle once as needed (Anaphylaxis) 2 each 2    [DISCONTINUED] folic acid  (FOLVITE ) 1 MG tablet Take 1 tablet (1,000 mcg) by mouth once daily 90 tablet 1    [DISCONTINUED] hydrOXYzine  (ATARAX ) 10 MG tablet TAKE 1 TABLET BY MOUTH EVERY DAY NIGHTLY 90 tablet 3    [DISCONTINUED] methocarbamol  (ROBAXIN ) 750 MG tablet Take 1 tablet (750 mg) by mouth 4 (four) times daily (Patient taking differently: Take 1 tablet (750 mg) by  mouth as needed) 30 tablet 2    [DISCONTINUED] ondansetron  (ZOFRAN -ODT) 8 MG disintegrating tablet DISSOLVE 1 TABLET (8 MG) IN THE MOUTH EVERY 8 (EIGHT) HOURS AS NEEDED FOR NAUSEA 90 tablet 3    [DISCONTINUED] valACYclovir  (VALTREX ) 1000 MG tablet Take 2 tabs at onset; repeat once in 12 hours.  Total 4 tabs per outbreak (Patient taking differently: as needed Take 2 tabs at onset; repeat once in 12 hours.  Total 4 tabs per outbreak) 12 tablet 3     Current Facility-Administered Medications for the 09/10/24 encounter (  Office Visit) with Woodard Geni HERO, MD   Medication Dose Route Frequency Provider Last Rate Last Admin    onabotulinumtoxin A (BOTOX ) injection 175 Units  175 Units Intramuscular Q12 Weeks Kastl, Charlotte M, MD   175 Units at 09/16/24 1150     Allergies   Allergen Reactions    Fruit Blend Flavor [Flavoring Agent (Non-Screening)] Anaphylaxis and Swelling     Tropical Fruit     Pineapple Anaphylaxis    Latex Drug-Induced Flushing, Hives, Other (See Comments), Swelling, Itching and Rash    Cyanoacrylate Itching and Rash    Other Itching and Rash     Sutures Monocryl    Oxycodone Hallucinations, Hives and Other (See Comments)     Hallucinations, took 1 time 25 years ago    Wound Dressing Adhesive Itching and Other (See Comments)     adhesive          PHYSICAL EXAM  Vitals:    09/10/24 1615   BP: 149/89   Pulse:      General: In No Apparent Distress, non toxic appearing, Appears Stated Age, Alert and Oriented  HEENT: Normocephalic, Atraumatic, Anicteric Sclerae, Pinna appear normal without erythema or edema  Cardiovascular: Normal S1, S2, no murmurs, rubs or gallops, regular heart rate  Pulmonary: No conversational dyspnea, breathing comfortably on room air, Clear to Auscultation Bilaterally, no wheezing, rhonchi or rales  Neurologic: CN 3-12 Intact, Moving all Extremities Equally, 5/5 Muscle Strength with Hand Grip, Shoulder Abduction/Adduction, Elbow Flexion/Extension, hip flexion, knee flexion/extension,  ankle dorsiflexion/plantar flexion bilaterally, No cerebellar dysmetria, Ambulates Normally  Extremities: Warm, Well Perfused, No edema  Dermatologic: No malar rash, no discoid lesions, No psoriasis plaques, No nail dystrophy    MSK:     DIP:No synovitis and full range of motion  PIP: No synovitis and full range of motion  MCP: No synovitis and full range of motion  Wrists: No synovitis and full range of motion  Elbows: No effusions, bursitis and full range of motion  Shoulders: Full Range of Motion  Cervical: Full Range of Motion  Lumbar: Full Range of Motion  Hips: Full Range of Motion and Negative FABER Sign  Knees: No effusions, No crepitus and Full Range of Motion  Ankles: ankle fullness(left)  MTP: No synovial hypertrophy  Feet: No Pain with Tarsal Squeeze          LABS:  Lab Results   Component Value Date    WBC 11.81 (H) 09/25/2024    HGB 13.5 09/25/2024    HCT 40.4 09/25/2024    MCV 99.5 (H) 09/25/2024    PLT 297 09/25/2024      Lab Results   Component Value Date    CREAT 0.7 09/25/2024    BUN 12 09/25/2024    NA 140 09/25/2024    K 4.3 09/25/2024    CL 105 09/25/2024    CO2 24 09/25/2024      Lab Results   Component Value Date    ALT 43 09/25/2024    AST 25 09/25/2024    ALKPHOS 41 09/25/2024    BILITOTAL 0.5 09/25/2024     Lab Results   Component Value Date    ESR 5 09/25/2024    ESR 3 07/31/2024    ESR 8 06/05/2024    ESR 8 04/02/2024    ESR 3 02/06/2024      Lab Results   Component Value Date    CRP 0.2 09/25/2024    CRP 0.1 07/31/2024  CRP <0.1 06/05/2024    CRP <0.1 04/02/2024    CRP <0.1 02/06/2024      No results found for: ANA   Lab Results   Component Value Date    RHEUMFACTOR <15.0 02/06/2020      Lab Results   Component Value Date    CCP <15.6 02/06/2020     No results found for: URICACID        ASSESSMENT/PLAN:    This patient is a 43 y.o. 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  Behet's syndrome  Fevers  [id number]  Uveitis  Inflamamtory arthritis  Pathergy  Neuro behcets, seizures    Assessment & Plan      Behcet's disease with uveitis, inflammatory arthritis, and oral aphthae  Uveitis stable but risk of exacerbation with prednisone  taper. Inflammatory arthritis affects hands and SI joints. Low dose naltrexone and SI joint injections effective. Methotrexate  discrepancy noted. Monitoring infliximab  antibody levels.  - Continue methotrexate  and low dose naltrexone.  - Monitor infliximab  antibody levels.  - Request more frequent eye visits with Dr. Dion during prednisone  taper.  - Refilled Otezla  and folic acid .  - Consider increasing Remicade  dose if uveitis worsens with prednisone  taper.    Left ankle pain and swelling, status post planned surgery  Significant left ankle pain and swelling with surgery scheduled. Lymphedema ruled out.  - Proceed with scheduled left ankle surgery.  - Continue current medications and follow post-operative instructions.    Fatty liver with incidental hepatic parenchymal density  No acute changes.  - Continue monitoring liver function tests.    High Risk Med Use  Frequent lab monitoring required in the setting of MTX use, at least every 3 months to monitor for cytopenias, renal insufficiency, liver toxicity.  Labs ordered                1. Behcet's disease (CMS/HCC)  - Apremilast  (Otezla ) 30 MG Tablet; Take 1 tablet (30 mg) by mouth 2 (two) times daily  Dispense: 180 tablet; Refill: 1  - folic acid  (FOLVITE ) 1 MG tablet; Take 1 tablet (1,000 mcg) by mouth once daily  Dispense: 90 tablet; Refill: 1    2. Aphthous ulcer  - Apremilast  (Otezla ) 30 MG Tablet; Take 1 tablet (30 mg) by mouth 2 (two) times daily  Dispense: 180 tablet; Refill: 1    3. Fatty liver disease, nonalcoholic  - Apremilast  (Otezla ) 30 MG Tablet;  Take 1 tablet (30 mg) by mouth 2 (two) times daily  Dispense: 180 tablet; Refill: 1    4. Seronegative rheumatoid arthritis (CMS/HCC)  - Apremilast  (Otezla ) 30 MG Tablet; Take 1 tablet (30 mg) by mouth 2 (two) times daily  Dispense: 180 tablet; Refill: 1  - folic acid  (FOLVITE ) 1 MG tablet; Take 1 tablet (1,000 mcg) by mouth once daily  Dispense: 90 tablet; Refill: 1    5. High risk medication use  - folic acid  (FOLVITE ) 1 MG tablet; Take 1 tablet (1,000 mcg) by mouth once daily  Dispense: 90 tablet; Refill: 1    6. Uveitis  - folic acid  (FOLVITE ) 1 MG tablet; Take 1 tablet (1,000 mcg) by mouth once daily  Dispense: 90 tablet; Refill: 1    7. Chronic pain of left ankle  - folic acid  (FOLVITE ) 1 MG tablet;  Take 1 tablet (1,000 mcg) by mouth once daily  Dispense: 90 tablet; Refill: 1                                               FOLLOW UP:  Return in about 6 weeks (around 10/22/2024) for 2-3 months video, 40 minutes.                                                                                  Geni Derby, MD  Rheumatologist  [facility]  [address]  [place], [place] [address]  Phone: [phone]  Fax: [phone]

## 2024-09-10 NOTE — Progress Notes (Signed)
 The prescription has been received by the Pharmacy Care Team. A benefit investigation is currently in process.    To follow up on the outcome of the benefit investigation, please check Episodes, Referrals or specialty pharmacy encounter section in the Patient chart review

## 2024-09-10 NOTE — Progress Notes (Signed)
 Received refill eRx via [facility] and ran test claim via [facility]. PA active on file. RTS until 10/21/24. Insurance lockout as well. Patient currently fills at [facility]     Script released to [facility]

## 2024-09-10 NOTE — Patient Instructions (Addendum)
 Dear Hailey [name] ,     It was lovely to see you in clinic today.    VISIT SUMMARY:  You came in today for a follow-up visit to discuss your Behcet's disease, uveitis, inflammatory arthritis, and upcoming ankle surgery. We reviewed your current symptoms, medications, and recent lab results.    YOUR PLAN:  -BEHCET'S DISEASE WITH UVEITIS, INFLAMMATORY ARTHRITIS, AND ORAL APHTHAE: Behcet's disease is a rare disorder causing blood vessel inflammation throughout your body. Your uveitis (eye inflammation) is stable, but there's a risk it could worsen if we reduce your prednisone . Your inflammatory arthritis affects your hands and sacroiliac joints. We will continue your current medications, including methotrexate  and low-dose naltrexone, and monitor your infliximab  antibody levels. You should have more frequent eye check-ups with Dr. Dion during the prednisone  taper. We have refilled your Otezla  and folic acid  prescriptions. If your uveitis worsens, we may consider increasing your Remicade  dose.    -LEFT ANKLE PAIN AND SWELLING, STATUS POST PLANNED SURGERY: You have significant pain and swelling in your left ankle, and you are scheduled for surgery. Lymphedema has been ruled out. Please proceed with your scheduled surgery and continue taking your current medications. Follow all post-operative instructions carefully.    -FATTY LIVER WITH INCIDENTAL HEPATIC PARENCHYMAL DENSITY: Fatty liver is a condition where fat builds up in your liver. There are no acute changes noted, so we will continue to monitor your liver function tests.    INSTRUCTIONS:  Please proceed with your scheduled left ankle surgery and follow all post-operative instructions. Continue taking your current medications as prescribed. Schedule more frequent eye visits with Dr. Dion during your prednisone  taper. We will monitor your infliximab  antibody levels and liver function tests. If your uveitis worsens, we may consider increasing your Remicade   dose.          We will discuss your results at your next appointment. Please set up that appointment on your way out. If there are any extraordinary abnormal results, you will hear from me or our staff here in the office.    Sincerely,    Geni Derby, MD  Rheumatologist  [facility]  [address]  [place], [place] [address]  Phone: [phone]  Fax: [phone]

## 2024-09-10 NOTE — Progress Notes (Signed)
 Benefits Investigation Summary  Medication: Apremilast  (Otezla )     Prescription: Renewal  Benefits Outcome: Refill Too Soon (until 10/21/24. Insurance lockout as well)     Estimated Rx Copay:  No data recorded   Additional Notes:  Next Step: Prescription has been released to [facility]     :

## 2024-09-12 ENCOUNTER — Ambulatory Visit: Payer: BLUE CROSS/BLUE SHIELD

## 2024-09-12 ENCOUNTER — Ambulatory Visit
Admission: RE | Admit: 2024-09-12 | Discharge: 2024-09-12 | Disposition: A | Discharge status: Home / Self Care | Payer: BLUE CROSS/BLUE SHIELD | Source: Ambulatory Visit

## 2024-09-12 ENCOUNTER — Encounter: Admission: RE | Disposition: A | Discharge status: Home / Self Care | Payer: Self-pay | Source: Ambulatory Visit

## 2024-09-12 DIAGNOSIS — M25472 Effusion, left ankle: Secondary | ICD-10-CM

## 2024-09-12 DIAGNOSIS — G43109 Migraine with aura, not intractable, without status migrainosus: Secondary | ICD-10-CM | POA: Insufficient documentation

## 2024-09-12 DIAGNOSIS — E66813 Obesity, class 3: Secondary | ICD-10-CM | POA: Insufficient documentation

## 2024-09-12 DIAGNOSIS — R2242 Localized swelling, mass and lump, left lower limb: Secondary | ICD-10-CM

## 2024-09-12 DIAGNOSIS — Z6841 Body Mass Index (BMI) 40.0 and over, adult: Secondary | ICD-10-CM | POA: Insufficient documentation

## 2024-09-12 DIAGNOSIS — M0600 Rheumatoid arthritis without rheumatoid factor, unspecified site: Secondary | ICD-10-CM | POA: Insufficient documentation

## 2024-09-12 DIAGNOSIS — G40909 Epilepsy, unspecified, not intractable, without status epilepticus: Secondary | ICD-10-CM | POA: Insufficient documentation

## 2024-09-12 DIAGNOSIS — D1724 Benign lipomatous neoplasm of skin and subcutaneous tissue of left leg: Secondary | ICD-10-CM | POA: Insufficient documentation

## 2024-09-12 DIAGNOSIS — M25572 Pain in left ankle and joints of left foot: Secondary | ICD-10-CM

## 2024-09-12 DIAGNOSIS — I493 Ventricular premature depolarization: Secondary | ICD-10-CM | POA: Insufficient documentation

## 2024-09-12 DIAGNOSIS — M352 Behcet's disease: Secondary | ICD-10-CM | POA: Insufficient documentation

## 2024-09-12 HISTORY — PX: EXCISION, SOFT TISSUE: SHX4032

## 2024-09-12 SURGERY — EXCISION, SOFT TISSUE
Anesthesia: Anesthesia General | Laterality: Left | Wound class: Clean

## 2024-09-12 MED ORDER — SCOPOLAMINE 1 MG/3DAYS TD PT72
MEDICATED_PATCH | TRANSDERMAL | Status: AC
Start: 2024-09-12 — End: 2024-09-12
  Filled 2024-09-12: qty 1

## 2024-09-12 MED ORDER — ONDANSETRON HCL 4 MG/2ML IJ SOLN
4.0000 mg | Freq: Once | INTRAMUSCULAR | Status: DC | PRN
Start: 2024-09-12 — End: 2024-09-12

## 2024-09-12 MED ORDER — MIDAZOLAM HCL 1 MG/ML IJ SOLN (WRAP)
INTRAMUSCULAR | Status: DC | PRN
Start: 2024-09-12 — End: 2024-09-12
  Administered 2024-09-12: 2 mg via INTRAVENOUS

## 2024-09-12 MED ORDER — ONDANSETRON HCL 4 MG/2ML IJ SOLN
INTRAMUSCULAR | Status: AC
Start: 2024-09-12 — End: 2024-09-12
  Filled 2024-09-12: qty 2

## 2024-09-12 MED ORDER — DEXAMETHASONE SODIUM PHOSPHATE 4 MG/ML IJ SOLN (WRAP)
INTRAMUSCULAR | Status: DC | PRN
Start: 2024-09-12 — End: 2024-09-12
  Administered 2024-09-12: 8 mg via INTRAVENOUS

## 2024-09-12 MED ORDER — LIDOCAINE HCL (PF) 1 % IJ SOLN
INTRAMUSCULAR | Status: DC | PRN
Start: 2024-09-12 — End: 2024-09-12
  Administered 2024-09-12: 50 mg via INTRAVENOUS

## 2024-09-12 MED ORDER — FENTANYL CITRATE (PF) 50 MCG/ML IJ SOLN (WRAP)
25.0000 ug | INTRAMUSCULAR | Status: AC | PRN
Start: 2024-09-12 — End: 2024-09-12
  Administered 2024-09-12 (×4): 25 ug via INTRAVENOUS
  Filled 2024-09-12: qty 2

## 2024-09-12 MED ORDER — FENTANYL CITRATE (PF) 50 MCG/ML IJ SOLN (WRAP)
INTRAMUSCULAR | Status: DC | PRN
Start: 2024-09-12 — End: 2024-09-12
  Administered 2024-09-12 (×4): 25 ug via INTRAVENOUS

## 2024-09-12 MED ORDER — PROPOFOL 10 MG/ML IV EMUL (WRAP)
INTRAVENOUS | Status: DC | PRN
Start: 2024-09-12 — End: 2024-09-12
  Administered 2024-09-12: 50 mg via INTRAVENOUS
  Administered 2024-09-12: 30 mg via INTRAVENOUS
  Administered 2024-09-12: 250 mg via INTRAVENOUS

## 2024-09-12 MED ORDER — DEXAMETHASONE SODIUM PHOSPHATE 4 MG/ML IJ SOLN
INTRAMUSCULAR | Status: AC
Start: 2024-09-12 — End: 2024-09-12
  Filled 2024-09-12: qty 2

## 2024-09-12 MED ORDER — CEFAZOLIN SODIUM 1 G IJ SOLR
INTRAMUSCULAR | Status: AC
Start: 2024-09-12 — End: 2024-09-12
  Filled 2024-09-12: qty 3000

## 2024-09-12 MED ORDER — SODIUM CHLORIDE 0.9 % IV SOLN
INTRAVENOUS | Status: DC | PRN
Start: 2024-09-12 — End: 2024-09-12
  Administered 2024-09-12 (×2): 8 ug via INTRAVENOUS

## 2024-09-12 MED ORDER — ONDANSETRON HCL 4 MG/2ML IJ SOLN
INTRAMUSCULAR | Status: DC | PRN
Start: 2024-09-12 — End: 2024-09-12
  Administered 2024-09-12: 4 mg via INTRAVENOUS

## 2024-09-12 MED ORDER — LACTATED RINGERS IV SOLN
INTRAVENOUS | Status: DC | PRN
Start: 2024-09-12 — End: 2024-09-12
  Filled 2024-09-12: qty 1000

## 2024-09-12 MED ORDER — PHENYLEPHRINE HCL 10 MG/ML IV SOLN (WRAP)
Status: DC | PRN
Start: 2024-09-12 — End: 2024-09-12
  Administered 2024-09-12 (×2): 50 ug via INTRAVENOUS

## 2024-09-12 MED ORDER — ACETAMINOPHEN 500 MG PO TABS
1000.0000 mg | ORAL_TABLET | Freq: Once | ORAL | Status: AC | PRN
Start: 2024-09-12 — End: 2024-09-12
  Administered 2024-09-12: 1000 mg via ORAL
  Filled 2024-09-12: qty 2

## 2024-09-12 MED ORDER — PROPOFOL 10 MG/ML IV EMUL (WRAP)
INTRAVENOUS | Status: AC
Start: 2024-09-12 — End: 2024-09-12
  Filled 2024-09-12: qty 40

## 2024-09-12 MED ORDER — MIDAZOLAM HCL 1 MG/ML IJ SOLN (WRAP)
INTRAMUSCULAR | Status: AC
Start: 2024-09-12 — End: 2024-09-12
  Filled 2024-09-12: qty 2

## 2024-09-12 MED ORDER — CEFAZOLIN SODIUM 1 G IJ SOLR
INTRAMUSCULAR | Status: DC | PRN
Start: 2024-09-12 — End: 2024-09-12
  Administered 2024-09-12: 3 g via INTRAVENOUS

## 2024-09-12 MED ORDER — LIDOCAINE HCL (PF) 1 % IJ SOLN
INTRAMUSCULAR | Status: AC
Start: 2024-09-12 — End: 2024-09-12
  Filled 2024-09-12: qty 5

## 2024-09-12 MED ORDER — HYDROMORPHONE HCL 1 MG/ML IJ SOLN
0.5000 mg | INTRAMUSCULAR | Status: DC | PRN
Start: 2024-09-12 — End: 2024-09-12

## 2024-09-12 MED ORDER — SCOPOLAMINE 1 MG/3DAYS TD PT72
1.0000 | MEDICATED_PATCH | TRANSDERMAL | Status: DC
Start: 2024-09-12 — End: 2024-09-12
  Administered 2024-09-12: 1 via TRANSDERMAL

## 2024-09-12 MED ORDER — FENTANYL CITRATE (PF) 50 MCG/ML IJ SOLN (WRAP)
INTRAMUSCULAR | Status: AC
Start: 2024-09-12 — End: 2024-09-12
  Filled 2024-09-12: qty 2

## 2024-09-12 MED ORDER — BUPIVACAINE HCL (PF) 0.5 % IJ SOLN
INTRAMUSCULAR | Status: DC | PRN
Start: 2024-09-12 — End: 2024-09-12
  Administered 2024-09-12: 20 mL

## 2024-09-12 SURGICAL SUPPLY — 30 items
BANDAGE ACE COMPRESSION L5 YD X W6 IN ELASTIC CONTACT CLOSURE OVER (Bandage) ×1 IMPLANT
BANDAGE GAUZE L4.1 YD X W4.5 IN 6 PLY OPEN WEAVE TIGHT FINISH EDGE (Dressing) ×1 IMPLANT
BANDAGE MEDIUM COMPRESSION L5 YD X W4 IN ELASTIC HOOK LOOP CLOSURE (Bandage) ×1 IMPLANT
BANDAGE PLASTER SPECIALIST PLASTER OF PARIS L5 YD X W4 IN SMOOTH (Cast) IMPLANT
BLADE 15 BARD-PARKER RIB-BACK SAFETYLOCK CARBON STEEL SURGICAL (Blade) ×2 IMPLANT
COVER FLEXIBLE MEDLINE LIGHT HANDLE PLASTIC GREEN (Drape) ×1 IMPLANT
CUFF TOURNIQUET CYLINDER L34 IN X W4 IN 2 PORT 1 BLADDER POSITIVE LOCK (Cuff) ×1 IMPLANT
DRAPE SURGICAL ADHESIVE L51 IN X W47 IN STERI-DRAPE CLEAR (Drape) ×1 IMPLANT
DRAPE SURGICAL SHEET L77 IN X W53 IN MEDLINE SMS 3/4 BLUE (Drape) ×1 IMPLANT
DRESSING PETROLATUM L8 IN X W3 IN NONADHESIVE NONOCCLUSIVE IMPREGNATE (Procedure Accessories) ×1 IMPLANT
ELECTRODE ADULT PATIENT RETURN L9 FT REM POLYHESIVE ACRYLIC FOAM (Procedure Accessories) ×1 IMPLANT
GLOVE SURGICAL 6.5 BIOGEL PI POWDER FREE BEAD CUFF MICRO ROUGHENED NON (Glove) ×1 IMPLANT
GLOVE SURGICAL 7 BIOGEL PI INDICATOR UNDERGLOVE POWDER FREE SMOOTH (Glove) ×1 IMPLANT
GLOVE SURGICAL 8 1/2 BIOGEL PI INDICATOR UNDERGLOVE POWDER FREE SMOOTH (Glove) ×1 IMPLANT
GLOVE SURGICAL 8 1/2 BIOGEL PI POWDER FREE BEAD CUFF NONPYROGENIC (Glove) ×2 IMPLANT
GOWN SURGICAL 2XL SMARTGOWN LEVEL 4 BREATHABLE (Gown) ×1 IMPLANT
KIT ROOM TURNOVER NONSTERILE LATEX FREE DISPOSABLE (Kits) ×1 IMPLANT
NEEDLE HYPODERMIC L1.5 IN OD20 GA STANDARD REGULAR BD YALE (Needles) ×1 IMPLANT
PAD ABDOMINAL L9 IN X W5 IN ABSORBENT NONWOVEN HYDROPHOBIC BACK SEAL (Procedure Accessories) ×2 IMPLANT
SOLUTION SURGICAL PREP 26 ML DURAPREP 74% ISOPROPYL ALCOHOL 0.7% (Prep) ×2 IMPLANT
SPONGE GAUZE COTTON L4 IN X W4 IN 12 PLY WOVEN FOLD EDGE STERILE LATEX FREE (Dressing) ×2 IMPLANT
SPONGE GAUZE L4 IN X W4 IN 12 PLY WOVEN FOLD EDGE XRAY DETECT COTTON (Dressing) ×2 IMPLANT
STAPLER SKIN L4.1 MM X W6.5 MM 35 WIDE STAPLE CARTRIDGE APPOSE ULC (Staplers) IMPLANT
STOCKINETTE COTTON WAX L48 IN X W6 IN 2 PLY EXTENSIVE LINE PULL TAB (Patient Supply) ×1 IMPLANT
SUTURE COATED VICRYL 2-0 CT-2 L27 IN BRAID COATED UNDYED ABSORBABLE (Suture) ×1 IMPLANT
SUTURE COATED VICRYL PLUS 0 CT-2 L27 IN BRAID ANTIBACTERIAL COATED (Suture) ×1 IMPLANT
SUTURE ETHILON BLACK 3-0 PS-2 L18 IN MONOFILAMENT NONABSORBABLE (Suture) ×2 IMPLANT
SYRINGE 20 ML BD LUER-LOK MEDICAL (Syringes, Needles) ×1 IMPLANT
TOWEL OR L27 IN X W17 IN STANDARD PREWASH DELINT ABSORBENT COTTON BLUE (Procedure Accessories) ×1 IMPLANT
TRAY SURGICAL EXTREMITY ~~LOC~~ (Pack) ×1 IMPLANT

## 2024-09-12 NOTE — Op Note (Signed)
 FULL OPERATIVE NOTE    Date Time: 09/12/24 12:07 PM  Patient Name: Hailey [name], Hailey [name] (MRN: [id number])  Attending Physician: Myrle Lenis, MD      Date of Operation:   09/12/2024    Providers Performing:   Surgeons and Role:     DEWAINE Myrle Lenis, MD - Primary    Surgical First Assistant(s):   First Assistant: Ed Coria    Operative Procedure:   EXCISION OF LEFT ANTEROLATERAL ANKLE MASS: 27632 (CPT)      Preoperative Diagnosis:   Pre-Op Diagnosis Codes:      * Left ankle swelling [M25.472]     * Left ankle pain, unspecified chronicity [M25.572]     * Ankle mass, left [R22.42]    Postoperative Diagnosis:   Post-Op Diagnosis Codes:     * Left ankle swelling [M25.472]     * Left ankle pain, unspecified chronicity [M25.572]     * Ankle mass, left [R22.42]    Anesthesia:   general    Findings:   Protuberant fat left anterior lateral ankle    Indications:   Painful left anterior lateral ankle mass    Operative Notes:   Patient is a 43 year old female whose had pain in the lateral ankle for approximately 2 years.  She feels that she has had a mass increasing in size in the anterior lateral ankle.  MRI showed no mass but subcutaneous fat only.  Did discuss with her that there is a normal amount of fat in this region however she may have a lipoma as well.  It is causing pain daily.  She has ruled out other causes.  She has failed conservative therapies.  We discussed operative and nonoperative treatments respites alternatives include but not limited to pain bleeding scar infection blood clots heart attack strokes death need for more surgery incomplete resolution of symptoms overcorrection under correction injury nerves blood vessels muscle tendons or other structures.  We discussed as possible that she does not have any relief at all.  We discussed that she could be worse.  We discussed the possibility recurrence.  We did discuss that we would send the tissue to pathology.  After weighing the risk benefits  alternatives operative and nonoperative treatment the patient requested proceed with surgical intervention.    On the day of surgery she presented to [facility] identified the preoperative holding area from consent was reviewed and the left side was noted to be the correct site was marked.  She was taken by anesthesia to the operating theater and placed under general anesthesia positioned supine ensuring all bony prominences were well-padded.  Left lower extremities prepped and draped in usual standard sterile fashion.  Time was performed again on the left side the correct site was confirmed with the marking on the leg.    Leg was exsanguinated with an Esmarch bandage tourniquet was raised.  Incision over the anterior lateral ankle was taken through skin and subcutaneous tissue.  I then bluntly dissected out the prominent fat pad in this area.  I then removed that and passed that off for specimen.  I then confirmed that I had complete removal of the overlying fat pad from the anterior lateral joint.  The wound was then copiously irrigated.  The tourniquet was let down hemostasis was obtained.  Subcutaneous tissue was closed with a 2-0 Vicryl suture and the skin with 3-0 nylon suture.  Half percent Marcaine  plain was injected for postop pain control.  Sterile dressings were applied.  Patient was placed in a well-padded short leg splint awake from anesthesia and taken the recovery.    Plan: Nonweightbearing p.o. pain control antimicrobial prophylaxis DVT prophylaxis follow-up in clinic.          Estimated Blood Loss:   2 mL    Implants:   * No implants in log *     none  Drains:   Drains: no      Specimens:     ID Type Source Tests Collected by Time Destination   1 : LEFT ANKLE MASS Tissue Soft Tissue (Specify if applicable) SURGICAL PATHOLOGY Myrle Lenis, MD 09/12/2024 1135          Complications:   None    Signed by: Lenis Myrle, MD  [facility]

## 2024-09-12 NOTE — Progress Notes (Signed)
 Patient transferred from PACU to phase 2.  On transfer patient easily arousable, MAEs, VSS. Resp even, nonlabored. IV patent . Dsg CDI. Pt stated pain level tolerable. No episodes of apnea or hypopnea witnessed in PACU. Pt's bed clean, dry, and  unsoiled prior to transport. Family updated in PACU. Pt's belongings and chart sent with pt. ISHAPED report given to nurse Seham.

## 2024-09-12 NOTE — Anesthesia Postprocedure Evaluation (Signed)
 Anesthesia Post Evaluation    Patient: Hailey [name]    EXCISION OF LEFT ANTEROLATERAL ANKLE MASS (Left)    Anesthesia type: general    Last Vitals:   Vitals Value Taken Time   BP 108/61 09/12/24 12:10   Temp 36.8 C (98.3 F) 09/12/24 12:10   Pulse 90 09/12/24 12:10   Resp 14 09/12/24 12:10   SpO2 99 % 09/12/24 12:10                 Anesthesia Post Evaluation:     Patient Evaluated: PACU  Patient Participation: complete - patient participated  Level of Consciousness: awake and alert    Pain Management: adequate  Multimodal analgesia pain management approach  Strategies: steroids and local anesthesia  Airway Patency: patent        Anesthetic complications: No      PONV Status: none    Cardiovascular status: acceptable  Respiratory status: acceptable  Hydration status: acceptable          Signed by: Mikki JINNY Snare, MD, 09/12/2024 12:16 PM

## 2024-09-12 NOTE — H&P (Signed)
 HISTORY AND PHYSICAL     Date Time: 09/12/24 10:28 AM  Patient Name: Hailey [name]  Attending Physician: Alm Melnick, MD  Primary Care Physician: [name], MD    Date of Admission:   09/12/2024  7:42 AM      Assessment/Plan:   The patient has the following active problems:  There are no active hospital problems to display for this patient.     Left anterior ankle mass    The patient has had a painful left anterior ankle mass.  MRI did not reveal any aggressive lesions only subcutaneous fat.  The patient feels that this has been getting larger.  She has failed conservative therapies.  Discussed operative versus nonoperative treatments the risk benefits alternatives including but not limited to pain bleeding scarring infection blood clots heart attack strokes death need for more surgery incomplete resolution of symptoms overcorrection under correction injury to nerves blood vessels muscle tendons of the structures.  I discussed quite frankly with her and her husband that I cannot guarantee that this will resolve her pain at all in fact that her pain may get worse.  We discussed about painful scarring or continued symptoms.  After having a discussion of the risk benefits alternatives have the questions answered to their satisfaction the patient requested proceed with surgical intervention.    Plan for excision of left anterior ankle mass today.      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 female who presents to the hospital after left anterior ankle mass      Allergies:   Allergies[1]    Medication:     Facility-Administered Medications Prior to Admission   Medication    onabotulinumtoxin A (BOTOX ) injection 175 Units     Medications Prior to Admission   Medication Sig    Apremilast  (Otezla ) 30 MG Tablet Take  1 tablet (30 mg) by mouth 2 (two) times daily    Azelastine-Fluticasone 137-50 MCG/ACT Suspension 1 spray by Nasal route 2 (two) times daily    botulinum toxin type A  (BOTOX ) 200 units injection INJECT 155 UNITS INTRAMUSCULARLY EVERY 12 WEEKS    busPIRone  (BUSPAR ) 5 MG tablet Take 1 tablet (5 mg) by mouth 2 (two) times daily    colesevelam  (WELCHOL ) 625 MG tablet TAKE 2 TABLETS (1,250 MG) BY MOUTH EVERY MORNING    cyanocobalamin  1000 MCG tablet Take 1 tablet (1,000 mcg) by mouth once daily    divalproex , ER, extended release (DEPAKOTE  ER) 500 MG 24 hr tablet Take 1 tablet (500 mg) by mouth 3 (three) times daily    eletriptan  (RELPAX ) 40 MG tablet Take 1 tablet (40 mg) by mouth as needed for Migraine .  Wait at least 2 hours prior to taking second dose.  Max 2 tabs/24 hours. Do not use more than 3 days per week.    folic acid  (FOLVITE ) 1 MG tablet Take 1 tablet (1,000 mcg) by mouth once daily    hydrOXYzine  (ATARAX ) 10 MG tablet TAKE 1 TABLET BY MOUTH EVERY DAY NIGHTLY    Lacosamide  (VIMPAT ) 200 MG Tablet Take 1 tablet (200 mg) by mouth 2 (two) times daily    Melatonin-Pyridoxine (MELATIN PO) Take 4 mg by mouth once at bedtime (Patient  taking differently: Take 1 mg by mouth once at bedtime)    methocarbamol  (ROBAXIN ) 750 MG tablet Take 1 tablet (750 mg) by mouth 4 (four) times daily (Patient taking differently: Take 1 tablet (750 mg) by mouth as needed)    Multiple Vitamin (MULTIVITAMIN PO) Take by mouth once daily    Naltrexone HCl, Pain, (Naltrex) 1.5 MG Cap Take by mouth    omeprazole  (PriLOSEC) 40 MG capsule Take 1 capsule (40 mg) by mouth once daily In the morning 30 mins before eating (Patient taking differently: Take 1 capsule (40 mg) by mouth once daily In the morning 30 mins before eating)    predniSONE  (DELTASONE ) 10 MG tablet TAKE 1 TABLET BY MOUTH EVERY DAY    Rimegepant Sulfate  75 MG Tablet Dispersible TAKE 1 TABLET (75 MG) BY MOUTH AS NEEDED (MIGRAINE)    VITAMIN D  PO Take 1 tablet by mouth once daily  Dosage unsure    clindamycin  (CLEOCIN  T) 1 % lotion Apply topically once every morning    diazePAM  (VALIUM ) 5 MG tablet Take 1 tablet (5 mg) by mouth every 6 (six) hours as needed for Anxiety (take 1/2 hour prior to the MRI)    dicyclomine  (BENTYL ) 20 MG tablet TAKE 1 TABLET (20 MG) BY MOUTH EVERY 6 (SIX) HOURS (Patient taking differently: Take 1 tablet (20 mg) by mouth as needed)    EPINEPHrine  (EPIPEN  2-PAK) 0.3 MG/0.3ML Solution Auto-injector injection Inject 0.3 mLs (0.3 mg) into the muscle once as needed (Anaphylaxis)    Fremanezumab -vfrm (Ajovy ) 225 MG/1.5ML Solution Auto-injector Inject 1 pen into the skin every 30 (thirty) days    inFLIXimab  100 MG injection Infuse into the vein once every eight weeks    lidocaine -prilocaine (EMLA) cream Apply topically as needed Dermatology procedures    methotrexate  2.5 MG tablet TAKE 2 TABLETS (5 MG) BY MOUTH ONCE A WEEK (Patient taking differently: Take 4 tablets (10 mg) by mouth once a week)    modafinil  (PROVIGIL ) 100 MG tablet Take 0.5 tablets (50 mg) by mouth once every morning    ondansetron  (ZOFRAN -ODT) 8 MG disintegrating tablet DISSOLVE 1 TABLET (8 MG) IN THE MOUTH EVERY 8 (EIGHT) HOURS AS NEEDED FOR NAUSEA    valACYclovir  (VALTREX ) 1000 MG tablet Take 2 tabs at onset; repeat once in 12 hours.  Total 4 tabs per outbreak (Patient taking differently: as needed Take 2 tabs at onset; repeat once in 12 hours.  Total 4 tabs per outbreak)       Past Medical History:   Medical History[2]    Past Surgical History:   Past Surgical History[3]    Family History:   Family History[4]    Social History:   Social History[5]        Review of Systems:   Review of Systems   Constitutional:  Negative for chills and fever.   HENT:  Negative for hearing loss.    Respiratory:  Negative for cough and wheezing.    Cardiovascular:  Negative for chest pain.   Gastrointestinal:  Negative for vomiting.       Physical Exam:   Physical Exam    Vitals:    09/12/24 0907   BP: 145/74   Pulse: 82    Resp: 16   Temp: 98.1 F (36.7 C)   SpO2: 100%   No acute distress  Lying in bed  Well-developed well-nourished female  Wearing glasses  Breathing comfortably with no audible wheezing  Normal heart rate    Left lower extremity:  Tenderness over the anterior lateral ankle where she has prominent subcutaneous fat overlying the ATFL  Skin is intact throughout  Normal capillary refill  Sensation grossly intact throughout    Labs:         Invalid input(s): AGAP      Attending Attestation:          [1]   Allergies  Allergen Reactions    Fruit Blend Flavor [Flavoring Agent (Non-Screening)] Anaphylaxis and Swelling     Tropical Fruit     Pineapple Anaphylaxis    Latex Drug-Induced Flushing, Hives, Other (See Comments), Swelling, Itching and Rash    Cyanoacrylate Itching and Rash    Other Itching and Rash     Sutures Monocryl    Oxycodone Hallucinations, Hives and Other (See Comments)     Hallucinations, took 1 time 25 years ago    Wound Dressing Adhesive Itching and Other (See Comments)     adhesive   [2]   Past Medical History:  Diagnosis Date    Abdominal hernia 2016    Resolved by surgical repair    Abnormal vision March 2024    reading glasses    Amenorrhea 1999    Resolved by hysterectomy    Anxiety 11/07/18    Arrhythmia     WPW ablation in 2005, 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 Released from cardiology    Atrial flutter (CMS/HCC) 20+ years    Previous history of WPW    Behcet's disease (CMS/HCC)     She sees Dr. 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    02/11/23 stable on medical    Diarrhea     Dizziness 15+ years    Allergies, migraine    Ear, nose and throat disorder     seasonal allergies    Fatigue 2019    Bechet's    Fever 11/09/2022    Recurrent fever due to Behcet's Disease    Fracture of hand 2016    Right hand middle finger    Hard to intubate 2019    Headache 2002     migraine    History of adverse reaction to anesthesia 2005    Nausea    History of ulcer disease 2019    Bechets Disease    Hx of ovarian cyst     Lower back pain     not severe    Meningitis 2011    Viral // treated and resolved    Migraine 2002    Near syncope 1995    Previous history of WPW    Other physical therapy March 2023    Neck    Pain in joint 2019    RA part of Bechets Disease    Pain in wrist 11/09/2022    Palpitations 20+ years    Previous history of WPW    Post-operative nausea and vomiting 2005    Rash 2019    Behcets disease - controlled by medication    Rheumatoid arthritis (CMS/HCC) 2019    Part of Behcets Disease    Scoliosis 1985    moderate// used a Brace at a young age    Shingles 2015    Swelling of extremity 2019    Left ankle    Uveitis     using eyedrops   [3]  Past Surgical History:  Procedure Laterality Date    ABDOMINAL SURGERY  2016; 2012; 2024    Umbilical hernia; hysterectomy    ABLATION - PVC'S N/A 06/08/2022    Procedure: Ablation - PVC's;  Surgeon: Von Oh, MD;  Location: [facility];  Service: Cardiovascular;  Laterality: N/A;  SAME DAY DISCHARGE  CARTO NO TEE    ARTHROTOMY, WRIST Right 12/03/2020    Procedure: RIGHT UPPER EXTREMITY SYNOVIAL BIOPSY;  Surgeon: Bolling Oh, MD;  Location: [facility];  Service: Plastics;  Laterality: Right;    BIOPSY, LYMPH NODE N/A 07/23/2018    Procedure: BIOPSY, LYMPH NODE;  Surgeon: Gloris Wilkie FALCON, MD;  Location: [id number];  Service: ENT;  Laterality: N/A;  NECK DEEP LYMPH NODE BIOPSY    BONE MARROW BIOPSY  2020    led to behcet's dx    CARDIAC ABLATION  2005    [facility]    CARDIAC CATHETERIZATION  2005    CHOLECYSTECTOMY  2009    COLONOSCOPY, DIAGNOSTIC (SCREENING) N/A 03/21/2023    Procedure: COLONOSCOPY, DIAGNOSTIC (SCREENING);  Surgeon: Leontine Countryman, MD;  Location: [facility];  Service: Gastroenterology;  Laterality: N/A;    DILATION AND CURETTAGE OF UTERUS  2010     EGD N/A 03/21/2023    Procedure: EGD;  Surgeon: Leontine Countryman, MD;  Location: [facility];  Service: Gastroenterology;  Laterality: N/A;    FINGER GANGLION CYST EXCISION  04/2018    FOOT SURGERY  2014    Ganglion cyst removal from Achilles    GANGLION CYST EXCISION  2014    04/2018 cyst removed [facility] index finger-[facility]    HAND SURGERY  January 2022    HERNIA REPAIR  2016    HYSTERECTOMY  2012    HYSTEROSCOPY  2011    indication: Abnormal bleeding    LAPAROSCOPIC, LYSIS, ADHESIONS N/A 11/09/2022    Procedure: LAPAROSCOPIC LYSIS OF ADHESIONS;  Surgeon: Jan, Ambareen G, MD;  Location: [facility];  Service: Gynecology;  Laterality: N/A;    LAPAROSCOPIC, SALPINGECTOMY Left 11/09/2022    Procedure: LAPAROSCOPIC, SALPINGECTOMY LEFT;  Surgeon: Madison Sedalia MATSU, MD;  Location: [facility];  Service: Gynecology;  Laterality: Left;    LAPAROSCOPY, DIAGNOSTIC N/A 11/09/2022    Procedure: LAPAROSCOPY, DIAGNOSTIC;  Surgeon: Madison Sedalia MATSU, MD;  Location:  [facility];  Service: Gynecology;  Laterality: N/A;  [id number]    UMBILICAL HERNIA REPAIR  2016    UPPER GASTROINTESTINAL ENDOSCOPY  2009    UPPER GASTROINTESTINAL ENDOSCOPY  2009    WISDOM TOOTH EXTRACTION  1998   [4]   Family History  Adopted: Yes   [5]   Social History  Socioeconomic History    Marital status: Married    Number of children: 0   Occupational History    Occupation: Pensions Consultant    Tobacco Use    Smoking status: Never     Passive exposure: Never    Smokeless tobacco: Never   Vaping Use    Vaping status: Never Used   Substance and Sexual Activity    Alcohol use: Never    Drug use: Never    Sexual activity: Yes     Partners: Male     Birth control/protection: Surgical     Comment: Hysterectomy   Social History Narrative    Diet: Non specific         Caffeine per day: tea 1 cup  ACD: N        Medical POA: N        DNR: N        Exercise: N     Social Drivers of Psychologist, Prison And Probation Services Strain: Low Risk (09/09/2024)    Overall  Financial Resource Strain (CARDIA)     Difficulty of Paying Living Expenses: Not hard at all   Food Insecurity: No Food Insecurity (09/09/2024)    Hunger Vital Sign     Worried About Running Out of Food in the Last Year: Never true     Ran Out of Food in the Last Year: Never true   Transportation Needs: No Transportation Needs (09/09/2024)    PRAPARE - Therapist, Art (Medical): No     Lack of Transportation (Non-Medical): No   Physical Activity: Insufficiently Active (09/09/2024)    Exercise Vital Sign     Days of Exercise per Week: 1 day     Minutes of Exercise per Session: 10 min   Stress: Stress Concern Present (09/09/2024)    [employer] of Occupational Health - Occupational Stress Questionnaire     Feeling of Stress : To some extent   Social Connections: Socially Integrated (05/15/2024)    Social Connection and Isolation Panel     Frequency of Communication with Friends and Family: Twice a week     Frequency of Social Gatherings with Friends and Family: Once a week     Attends Religious Services: 1 to 4 times per year     Active Member of [employer] or Organizations: Yes     Attends Banker Meetings: 1 to 4 times per year     Marital Status: Married   Catering Manager Violence: Not At Risk (09/09/2024)    Humiliation, Afraid, Rape, and Kick questionnaire     Fear of Current or Ex-Partner: No     Emotionally Abused: No     Physically Abused: No     Sexually Abused: No   Housing Stability: Not At Risk (09/09/2024)    Housing Stability NCSS     Do you have housing?: Yes     Are you worried about losing your housing?: No

## 2024-09-12 NOTE — Discharge Instr - AVS First Page (Addendum)
 [address] [place]  [place] [address]  Phone: [phone]  Fax: [phone]     September 10, 2024          Patient:  MRN:  Hailey [name]  [id number]  Date of Birth: November 26, 1980     Dear Hailey [name],     You are scheduled for surgery with Dr. Myrle on 09/12/24. Please find below information regarding your post-op instructions. Your pharmacy has received medication that you may pick up ahead of time, but should not be started until after surgery. Medications were sent to:        [id number] - [place], [place] - [address] [facility]  [address] [place] [address]  Phone: [phone] Fax: [phone]        Alm Myrle, MD  Orthopedic Foot and Ankle Surgery      Post Operative Instructions      1. Medications:  ? Your post-op medications have been electronically sent to your pharmacy. Please begin taking the medications starting when you return home after surgery. You have been sent the following medications:  i. Aspirin  81mg , 1 pill once per day with food for 14 days  ? This is a blood thinner to prevent blood clots after surgery  ii. Keflex  500mg , 1 pill every 6 hours for 1 day beginning immediately after surgery  ? This is an antibiotic used to prevent infections. It should be taken the day of surgery until completed  iii. Tramadol  50mg , 1 pill every 6 hours as needed for pain   ? This is a lower dose narcotic pain medication that can be added to the tylenol  and ibuprofen  for severe pain as needed.   iv. Tylenol  500mg , 2 pills every 8 hours for pain  ? This is a pain medication that should be taken regularly to decrease the amount of narcotic pain medication needed.   v. Ibuprofen  600mg , 1 pill every 6 hours for pain and inflammation   ? This is an antiinflammatory that can be taken in addition to tylenol .   vi. Zofran  4mg , 1 pill every 8 hours as needed for nausea  ? This is a medication to help with nausea you may experience after  surgery  vii. Vitamin C 500mg , 2 pills daily for 1 month   ? This is a medication to help prevent nerve related pain after surgery  viii. Colace 100mg , 1 pill every 12 hours as needed for constipation  ? This is a medication to help with constipation you may experience after surgery, or while taking narcotic pain medication  2. Activity and Weight Bearing:   ? It is important to refrain from unnecessary walking during the first few days after surgery. For safety, use your walker or crutches and do not bear any weight until your anesthesia block has worn off completely.  ? DO NOT PUT PRESSURE ON THE TOES. PATIENTS IN CASTS, SPLINTS, OR WALKING BOOTS SHOULD NOT BEAR ANY WEIGHT UNTIL INSTRUCTED.   ? If you are given a post op shoe to wear over your dressing, you can bear weight on the heel and outside of the foot, and it is okay to remove to shoe to sleep.  3. Exercises:  ? Due to the importance of elevation and swelling control it is important to do the following exercises.   i. Straight leg raises to both extremities   ii. Quadriceps sets and ankle pumps on the non-operative extremity  iii.  10 repetitions of each set 5 times a day  4. Ice and Elevation:  ? Swelling occurs after any surgery and can be treated and prevented with elevation. For the first 48-72 hours, elevate as much as possible to keep the swelling down, your extremity should be elevated above the level of your heart (unless instructed otherwise by the doctor or nurse). If you are in a cast, it is important to wiggle your toes and bend your knees on the operative side to promote good circulation.  i. Elevate the operative extremity with the toes above the level of the heart.  ii. DO NOT put any pressure on the heel. Make sure that pillows are under the leg/calf and not the heel to prevent sores.   5. Pain:  ? Foot and ankle surgery can be extremely painful. It is our goal to provide you with information about pain relief and include you in decisions  about the care of your pain. We have found that using a variety of different methods to reduce pain is effective.  ? Pain medication has some common side effects. You may feel dizzy, sleepy or have some nausea. If any type of reaction occurs, stop taking the medicine and call our office. Eating some food before taking pain medicine may decrease the chances of nausea.   ? It is important to stay ahead of the pain  i. Everyone metabolizes the block at a different rate, the time that the block lasts is an estimate only.   6. Bandages and Dressings:  ? Your incisions have been closed with sutures that will be removed in the office.   ? Whether you are in a cast, splint, walking boot or post op shoe it is important to keep it clean and dry.  ? DO NOT remove your dressing unless instructed to do so by the doctor. Dressing and taping is used to maintain the surgical correction.  ? It is uncommon for a bandage to be tight but it if does feel very tight, you can split it with scissors along the outside of the foot. The dressing can be loosened a quarter inch and then re-tape the dressing to secure it. If your cast or splint is too tight, please call the office or page the doctor.   ? Dressings and cast must be securely covered for showers. Commercial cast covers are available or use double plastic bags and secure with heavy tape. If your cast gets damp, use a hair dryer on cool setting to blow dry. If your cast becomes soaked, it will need to be changed as soon as possible. Moisture under the cast or dressing can cause problems and should be attended to ASAP. Call the office to report a problem.   7. Bathing and Showering:  ? If you chose to take a bath you will need someone to help you get in and out of the tub. You should not use a shower protector or put your leg under the water  in the bathtub. The shower protector may sometimes leak.  ? One way to take a bath is to raise the operative foot out of the tub and then to run  the water  into the tub. At the end of the bath, let all the water  out of the tub. Place a terry towel on the bottom of the bathtub and step onto this with your injured foot.   ? Be very careful not to slip. Have someone help you stand up before you  get out of the tub.   8. Observation:  ? Watch for a change in color of the nail beds of your toes. Check the blanching response every two hours.   i. To check blanching response: Apply pressure to the nail bed; the nail bed should become pale.   ii. Release the pressure, and the normal color should return rapidly. If the toe should turn dark colored or remain very white it is important to call our office.   ? If you have had a foot block, your foot will be numb for several hours. It will also be painless during this time. Sensation will return along with pain over 12 to 24 hours.   9. Questions and Follow-Up:   ? Please contact your surgeon or the office by calling [phone] option 3 if you are experiencing any of the following symptoms:  i. If you have an unusually large amount of bleeding or drainage coming from the dressing  ii. Fever greater than 101 degrees F  iii. Red streaking up your leg  ? The above instructions are guideline only. If you are experiencing a problem or have a concern, please call the office [phone]. If the office is closed remain on the line and you will be connected to the answering service.  ? If you are experiencing chest pain, shortness of breath, or have medical emergency, call 911 or go to your local emergency room for immediate attention.   ? DO NOT drive or operate complicated equipment or make any decisions of legal nature FOR AT LEAST 24 HOURS after surgery.  ? Your post-operative appointment is 09/23/24.     Sincerely,           Chiquita Barrio, PA-C  Physician Assistant to Dr. Myrle Scull Anesthesia Discharge Instructions    Although you may be awake and alert in the recovery room, small amounts of anesthetic remain in your  system for about 24 hours.  You may feel tired and sleepy during this time.      You are advised to go directly home from the hospital.    Plan to stay at home and rest for the remainder of the day.    It is advisable to have someone with you at home for 24 hours after surgery.    Do not operate a motor vehicle, or any mechanical or electrical equipment for the next 24 hours.      Be careful when you are walking around, you may become dizzy.  The effects of anesthesia and/or medications are still present and drowsiness may occur    Do not consume alcohol, tranquilizers, sleeping medications, or any other non prescribed medication for the remainder of the day.    Diet:  begin with liquids, progress your diet as tolerated or as directed by your surgeon.  Nausea and vomiting may occur in the next 24 hours.

## 2024-09-12 NOTE — Anesthesia Preprocedure Evaluation (Signed)
 Anesthesia Evaluation    AIRWAY    Mallampati: II    TM distance: >3 FB  Neck ROM: full  Mouth Opening:full  Planned to use difficult airway equipment: No CARDIOVASCULAR    cardiovascular exam normal, regular and normal       DENTAL    no notable dental hx               PULMONARY    pulmonary exam normal and clear to auscultation     OTHER FINDINGS                                        Relevant Problems   NEURO/PSYCH   (+) Migraine with aura and without status migrainosus, not intractable   (+) Post-dural puncture headache   (+) Seizure (CMS/HCC)      CARDIO   (+) Behcet recurrent disease (CMS/HCC)   (+) Migraine with aura and without status migrainosus, not intractable   (+) PVC's (premature ventricular contractions)   (+) Premature ventricular contractions (PVCs) (VPCs)      OTHER   (+) Seronegative rheumatoid arthritis (CMS/HCC)               Anesthesia Plan    ASA 3     general                     intravenous induction   Detailed anesthesia plan: general endotracheal        Post op pain management: per surgeon    informed consent obtained    Plan discussed with CRNA.    ECG reviewed  pertinent labs reviewed               Signed by: Mikki JINNY Snare, MD 09/12/24 8:42 AM    ===============================================================  Inpatient Anesthesia Evaluation    Patient Name: Hailey [name], Hailey [name]  Surgeon: Myrle Lenis, MD  Patient Age / Sex: 43 y.o. / female    Medical History:   Medical History[1]    Past Surgical History[2]      Allergies:   Allergies[3]      Medications:   Current Inpatient Medications with Last Dose Taken[4]           Prescriptions Prior to Admission[5]  Vitals        Wt Readings from Last 3 Encounters:   09/09/24 (!) 142.4 kg (314 lb)   08/30/24 (!) 142.4 kg (314 lb)   08/29/24 (!) 142.8 kg (314 lb 12.8 oz)     BMI (Estimated body mass index is 49.18 kg/m as calculated from the following:    Height as of 08/30/24: 1.702 m (5' 7).    Weight as of 09/09/24: 142.4 kg (314  lb).)  Temp Readings from Last 3 Encounters:   09/09/24 36.6 C (97.8 F) (Oral)   08/29/24 36.8 C (98.2 F) (Temporal)   08/06/24 36.7 C (98.1 F)     BP Readings from Last 3 Encounters:   09/10/24 149/89   09/09/24 (!) 148/92   08/29/24 138/77     Pulse Readings from Last 3 Encounters:   09/10/24 93   09/09/24 86   08/29/24 95           Labs:   CBC:  Lab Results   Component Value Date    WBC 14.78 (H) 08/29/2024    HGB 14.1 08/29/2024    HCT 43.0 08/29/2024  PLT 352 (H) 08/29/2024       Chemistries:  Lab Results   Component Value Date    NA 135 08/29/2024    K 4.8 08/29/2024    CL 101 08/29/2024    CO2 27 08/29/2024    BUN 15 08/29/2024    CREAT 0.9 08/29/2024    GLU 78 08/29/2024    HGBA1C 5.4 09/02/2024    MG 1.9 08/27/2022    CA 9.9 08/29/2024    ALT 29 07/31/2024    AST 20 07/31/2024       Coags:  Lab Results   Component Value Date    PT 11.2 02/06/2023    PTT 33 02/06/2023    INR 1.0 02/06/2023     _____________________      Signed by: Mikki JINNY Snare, MD  09/12/24   8:42 AM    =============================================================                 [1]   Past Medical History:  Diagnosis Date    Abdominal hernia 2016    Resolved by surgical repair    Abnormal vision March 2024    reading glasses    Amenorrhea 1999    Resolved by hysterectomy    Anxiety 11/07/18    Arrhythmia     WPW ablation in 2005, 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 Released from cardiology    Atrial flutter (CMS/HCC) 20+ years    Previous history of WPW    Behcet's disease (CMS/HCC)     She sees Dr. 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    02/11/23 stable on medical    Diarrhea     Dizziness 15+ years    Allergies, migraine    Ear, nose and throat disorder     seasonal allergies    Fatigue 2019    Bechet's    Fever 11/09/2022    Recurrent fever due to Behcet's Disease    Fracture of hand 2016    Right  hand middle finger    Hard to intubate 2019    Headache 2002    migraine    History of adverse reaction to anesthesia 2005    Nausea    History of ulcer disease 2019    Bechets Disease    Hx of ovarian cyst     Lower back pain     not severe    Meningitis 2011    Viral // treated and resolved    Migraine 2002    Near syncope 1995    Previous history of WPW    Other physical therapy March 2023    Neck    Pain in joint 2019    RA part of Bechets Disease    Pain in wrist 11/09/2022    Palpitations 20+ years    Previous history of WPW    Post-operative nausea and vomiting 2005    Rash 2019    Behcets disease - controlled by medication    Rheumatoid arthritis (CMS/HCC) 2019    Part of Behcets Disease    Scoliosis 1985    moderate// used a Brace at a young age    Shingles 2015    Swelling of extremity 2019    Left ankle    Uveitis     using eyedrops   [  2]   Past Surgical History:  Procedure Laterality Date    ABDOMINAL SURGERY  2016; 2012; 2024    Umbilical hernia; hysterectomy    ABLATION - PVC'S N/A 06/08/2022    Procedure: Ablation - PVC's;  Surgeon: Von Oh, MD;  Location: [facility];  Service: Cardiovascular;  Laterality: N/A;  SAME DAY DISCHARGE  CARTO NO TEE    ARTHROTOMY, WRIST Right 12/03/2020    Procedure: RIGHT UPPER EXTREMITY SYNOVIAL BIOPSY;  Surgeon: Bolling Oh, MD;  Location: [facility];  Service: Plastics;  Laterality: Right;    BIOPSY, LYMPH NODE N/A 07/23/2018    Procedure: BIOPSY, LYMPH NODE;  Surgeon: Gloris Wilkie FALCON, MD;  Location: [facility];  Service: ENT;  Laterality: N/A;  NECK DEEP LYMPH NODE BIOPSY    BONE MARROW BIOPSY  2020    led to behcet's dx    CARDIAC ABLATION  2005    [facility]  at [facility]    CARDIAC CATHETERIZATION  2005    CHOLECYSTECTOMY  2009    COLONOSCOPY, DIAGNOSTIC (SCREENING) N/A 03/21/2023    Procedure: COLONOSCOPY, DIAGNOSTIC (SCREENING);  Surgeon: Leontine Countryman, MD;  Location: [facility];  Service:  Gastroenterology;  Laterality: N/A;    DILATION AND CURETTAGE OF UTERUS  2010    EGD N/A 03/21/2023    Procedure: EGD;  Surgeon: Leontine Countryman, MD;  Location: [facility];  Service: Gastroenterology;  Laterality: N/A;    FINGER GANGLION CYST EXCISION  04/2018    FOOT SURGERY  2014    Ganglion cyst removal from Achilles    GANGLION CYST EXCISION  2014    04/2018 cyst removed [facility] index finger-[facility]    HAND SURGERY  January 2022    HERNIA REPAIR  2016    HYSTERECTOMY  2012    HYSTEROSCOPY  2011    indication: Abnormal bleeding    LAPAROSCOPIC, LYSIS, ADHESIONS N/A 11/09/2022    Procedure: LAPAROSCOPIC LYSIS OF ADHESIONS;  Surgeon: Jan, Ambareen G, MD;  Location: [facility];  Service: Gynecology;  Laterality: N/A;    LAPAROSCOPIC, SALPINGECTOMY Left 11/09/2022    Procedure: LAPAROSCOPIC, SALPINGECTOMY LEFT;  Surgeon: Madison Sedalia MATSU, MD;  Location: [facility];  Service: Gynecology;  Laterality: Left;    LAPAROSCOPY, DIAGNOSTIC N/A 11/09/2022    Procedure: LAPAROSCOPY, DIAGNOSTIC;  Surgeon: Madison Sedalia MATSU, MD;  Location:  [facility];  Service: Gynecology;  Laterality: N/A;  [id number]    UMBILICAL HERNIA REPAIR  2016    UPPER GASTROINTESTINAL ENDOSCOPY  2009    UPPER GASTROINTESTINAL ENDOSCOPY  2009    WISDOM TOOTH EXTRACTION  1998   [3]   Allergies  Allergen Reactions    Fruit Blend Flavor [Flavoring Agent (Non-Screening)] Anaphylaxis and Swelling     Tropical Fruit     Pineapple Anaphylaxis    Latex Drug-Induced Flushing, Hives, Other (See Comments), Swelling, Itching and Rash    Cyanoacrylate Itching and Rash    Other Itching and Rash     Sutures Monocryl    Oxycodone Hallucinations, Hives and Other (See Comments)     Hallucinations, took 1 time 25 years ago    Wound Dressing Adhesive Itching and Other (See Comments)     adhesive   [4]   Current Facility-Administered Medications   Medication Dose Route Frequency Last Rate Last Admin    lactated ringers  infusion   Intravenous Continuous PRN        [5]   Facility-Administered Medications Prior to Admission  Medication Dose Route Frequency Provider Last Rate Last Admin    onabotulinumtoxin A (BOTOX ) injection 175 Units  175 Units Intramuscular Q12 Weeks Kastl, Charlotte M, MD   175 Units at 06/11/24 1112     Medications Prior to Admission   Medication Sig Dispense Refill Last Dose/Taking    Apremilast  (Otezla ) 30 MG Tablet Take 1 tablet (30 mg) by mouth 2 (two) times daily 180 tablet 1 09/11/2024 at 10:00 PM    Azelastine-Fluticasone 137-50 MCG/ACT Suspension 1 spray by Nasal route 2 (two) times daily 23 g 5 09/12/2024 at  6:00 AM    botulinum toxin type A  (BOTOX ) 200 units injection INJECT 155 UNITS INTRAMUSCULARLY EVERY 12 WEEKS 1 each 3 Past Month    busPIRone  (BUSPAR ) 5 MG tablet Take 1 tablet (5 mg) by mouth 2 (two) times daily 180 tablet 3 09/11/2024 at 10:00 PM    colesevelam  (WELCHOL ) 625 MG tablet TAKE 2 TABLETS (1,250 MG) BY MOUTH EVERY MORNING 180 tablet 3 09/12/2024 at  6:00 AM    cyanocobalamin  1000 MCG tablet Take 1 tablet (1,000 mcg) by mouth once daily 30 tablet 0 Past Week    divalproex , ER, extended release (DEPAKOTE  ER) 500 MG 24 hr tablet Take 1 tablet (500 mg) by mouth 3 (three) times daily 270 tablet 3 09/11/2024 at 10:00 PM    eletriptan  (RELPAX ) 40 MG tablet Take 1 tablet (40 mg) by mouth as needed for Migraine .  Wait at least 2 hours prior to taking second dose.  Max 2 tabs/24 hours. Do not use more than 3 days per week. 12 tablet 5 Past Week    folic acid  (FOLVITE ) 1 MG tablet Take 1 tablet (1,000 mcg) by mouth once daily 90 tablet 1 Past Week    hydrOXYzine  (ATARAX ) 10 MG tablet TAKE 1 TABLET BY MOUTH EVERY DAY NIGHTLY 90 tablet 3 09/11/2024 at 10:00 PM    Lacosamide  (VIMPAT ) 200 MG Tablet Take 1 tablet (200 mg) by mouth 2 (two) times daily 180 tablet 1 09/11/2024 at 10:00 PM    Melatonin-Pyridoxine (MELATIN PO) Take 4 mg by mouth once at bedtime (Patient taking differently: Take 1 mg by mouth once at bedtime)   Past Week     methocarbamol  (ROBAXIN ) 750 MG tablet Take 1 tablet (750 mg) by mouth 4 (four) times daily (Patient taking differently: Take 1 tablet (750 mg) by mouth as needed) 30 tablet 2 Past Week    Multiple Vitamin (MULTIVITAMIN PO) Take by mouth once daily   Past Week    Naltrexone HCl, Pain, (Naltrex) 1.5 MG Cap Take by mouth   Past Week    omeprazole  (PriLOSEC) 40 MG capsule Take 1 capsule (40 mg) by mouth once daily In the morning 30 mins before eating (Patient taking differently: Take 1 capsule (40 mg) by mouth once daily In the morning 30 mins before eating) 90 capsule 1 09/12/2024 at  6:00 AM    predniSONE  (DELTASONE ) 10 MG tablet TAKE 1 TABLET BY MOUTH EVERY DAY 30 tablet 5 09/11/2024 at 11:55 AM    Rimegepant Sulfate  75 MG Tablet Dispersible TAKE 1 TABLET (75 MG) BY MOUTH AS NEEDED (MIGRAINE) 16 each 11 Past Week    VITAMIN D  PO Take 1 tablet by mouth once daily Dosage unsure   Past Week    clindamycin  (CLEOCIN  T) 1 % lotion Apply topically once every morning   09/10/2024    diazePAM  (VALIUM ) 5 MG tablet Take 1 tablet (5 mg) by mouth every 6 (six)  hours as needed for Anxiety (take 1/2 hour prior to the MRI) 5 tablet 0     dicyclomine  (BENTYL ) 20 MG tablet TAKE 1 TABLET (20 MG) BY MOUTH EVERY 6 (SIX) HOURS (Patient taking differently: Take 1 tablet (20 mg) by mouth as needed) 360 tablet 3     EPINEPHrine  (EPIPEN  2-PAK) 0.3 MG/0.3ML Solution Auto-injector injection Inject 0.3 mLs (0.3 mg) into the muscle once as needed (Anaphylaxis) 2 each 2 More than a month    Fremanezumab -vfrm (Ajovy ) 225 MG/1.5ML Solution Auto-injector Inject 1 pen into the skin every 30 (thirty) days 1.5 mL 11 08/29/2024    inFLIXimab  100 MG injection Infuse into the vein once every eight weeks   More than a month    lidocaine -prilocaine (EMLA) cream Apply topically as needed Dermatology procedures   More than a month    methotrexate  2.5 MG tablet TAKE 2 TABLETS (5 MG) BY MOUTH ONCE A WEEK (Patient taking differently: Take 4 tablets (10 mg) by mouth  once a week) 24 tablet 1 09/08/2024    modafinil  (PROVIGIL ) 100 MG tablet Take 0.5 tablets (50 mg) by mouth once every morning 30 tablet 1 Unknown    ondansetron  (ZOFRAN -ODT) 8 MG disintegrating tablet DISSOLVE 1 TABLET (8 MG) IN THE MOUTH EVERY 8 (EIGHT) HOURS AS NEEDED FOR NAUSEA 90 tablet 3 More than a month    valACYclovir  (VALTREX ) 1000 MG tablet Take 2 tabs at onset; repeat once in 12 hours.  Total 4 tabs per outbreak (Patient taking differently: as needed Take 2 tabs at onset; repeat once in 12 hours.  Total 4 tabs per outbreak) 12 tablet 3 09/10/2024

## 2024-09-16 ENCOUNTER — Encounter: Payer: Self-pay | Admitting: Family Nurse Practitioner

## 2024-09-16 ENCOUNTER — Ambulatory Visit: Payer: BLUE CROSS/BLUE SHIELD | Attending: Neurology | Admitting: Family Nurse Practitioner

## 2024-09-16 VITALS — BP 136/89 | HR 84 | Resp 18 | Ht 67.0 in | Wt 314.0 lb

## 2024-09-16 DIAGNOSIS — G43709 Chronic migraine without aura, not intractable, without status migrainosus: Secondary | ICD-10-CM

## 2024-09-16 DIAGNOSIS — M352 Behcet's disease: Secondary | ICD-10-CM

## 2024-09-16 DIAGNOSIS — R55 Syncope and collapse: Secondary | ICD-10-CM

## 2024-09-16 DIAGNOSIS — F458 Other somatoform disorders: Secondary | ICD-10-CM

## 2024-09-16 DIAGNOSIS — G43711 Chronic migraine without aura, intractable, with status migrainosus: Secondary | ICD-10-CM

## 2024-09-16 LAB — SURGICAL PATHOLOGY

## 2024-09-16 MED ORDER — ELETRIPTAN HYDROBROMIDE 40 MG PO TABS
40.0000 mg | ORAL_TABLET | ORAL | 5 refills | Status: AC | PRN
Start: 2024-09-16 — End: ?

## 2024-09-16 NOTE — Patient Instructions (Signed)
Our plan:     -You were injected with Botox today    -You may have pain and discomfort at the injection sites    -Please do not rub the injection sites    -Common side effects include headache pain, neck pain, or weakness    -If you have any side effects of concerns, please call the office and let me know    -Please track your headache days using a headache log and bring that information to your follow up visit    -Please return in 12 weeks for repeat Botox injection     Discussed the importance of lifestyle modifications:  - sleep hygiene ( keep a regular sleep schedule) maintaining appropriate hydration,   - avoid overuse of caffeine,   - eat regular meals - minimizing processed foods and emphasizing lean proteins and fruits and vegetables.     --Keep headache diary, record frequency of headache, location, duration, intensity, preceding symptoms (aura occurrence), triggers and pain relieving measures.  -- recommend Migraine Buddy app     -Remember to limit acute headache treatment medicines (like triptans or anti-inflammatories) to 2-3 days/week to avoid medication-overuse headache.         For migraine education resources:  American Headache Society: https://americanheadachesociety.org  American Migraine Foundation: https://americanmigrainefoundation.org    Today's Visit:      In today's visit, I reviewed your medications and records relating your health - prior testing, blood work, reports of other health care providers present in your electronic medical record.     If you have pertinent records from any non-[facility] doctors that you would like to review, please have them sent to us or bring to them to your next office visit.     A copy of today's visit will be sent to your referring doctor and/or primary care doctor, if you have one listed in our system.      Patient satisfaction survey:      If you receive a patient satisfaction survey, I would greatly appreciate it if you would complete it. We value your  feedback.     Contact me online:      Patient Portal online - Please sign up for MyChart -- this is the best way to communicate with your team here.  There is a messaging feature, where you can us messages anytime of day.  It is the best way to communicate with us and get test results, medication refills, or ask questions.    If you are having a medical emergency -- call 911, DO NOT SEND A MESSAGE THROUGH MYCHART.     Coupons for medication:      If you have any trouble affording your medications, check out www.goodrx.com for coupons and competitive prices in your area.  If you need further assistance, let us know so we can work with you and your insurance to make sure you get the best care.    Lea Oda  FNP-C  [facility]   [facility]: [phone]  [place]: [phone]

## 2024-09-16 NOTE — Progress Notes (Signed)
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 onset; repeat once in 12 hours.  Total 4 tabs per outbreak) 12 tablet 3    VITAMIN D  PO Take 1 tablet by mouth once daily Dosage unsure       Current Facility-Administered Medications   Medication Dose Route Frequency Provider Last Rate Last Admin    onabotulinumtoxin A (BOTOX ) injection 175 Units  175 Units Intramuscular Q12 Weeks Kastl, Charlotte M, MD   175 Units at 06/11/24 1112

## 2024-09-17 ENCOUNTER — Other Ambulatory Visit: Payer: Self-pay

## 2024-09-18 NOTE — Provider Clarification Note (Signed)
 Patient Name: Hailey [name], Hailey [name]  Account #: [id number]  MR #: [id number]  Discharge Date: 09/12/2024    Dear Dr Myrle,    The medical record reflects the following ,    EXCISION OF LEFT ANTEROLATERAL ANKLE MASS    Leg was exsanguinated with an Esmarch bandage tourniquet was raised. Incision over the anterior lateral ankle was taken through skin and subcutaneous tissue. I then bluntly dissected out the prominent fat pad in this area. I then removed that and passed that off for specimen. I then confirmed that I had complete removal of the overlying fat pad from the anterior lateral joint.     Request to Provider     Please document the size of the Mass removed.      Thank you,    Talupula Begum    Thank you for responding!    Documentation Query sent by: Elnora Pitt  Date:  09/17/2024        PROVIDER RESPONSE Insert a query response from the list above or add free text: 4cm in diameter

## 2024-09-20 ENCOUNTER — Other Ambulatory Visit: Payer: Self-pay

## 2024-09-23 ENCOUNTER — Encounter (INDEPENDENT_AMBULATORY_CARE_PROVIDER_SITE_OTHER): Payer: Self-pay | Admitting: Internal Medicine

## 2024-09-25 ENCOUNTER — Other Ambulatory Visit (INDEPENDENT_AMBULATORY_CARE_PROVIDER_SITE_OTHER): Payer: BLUE CROSS/BLUE SHIELD

## 2024-09-25 ENCOUNTER — Encounter: Payer: Self-pay | Admitting: Internal Medicine

## 2024-09-25 ENCOUNTER — Ambulatory Visit: Payer: BLUE CROSS/BLUE SHIELD | Attending: Internal Medicine

## 2024-09-25 VITALS — BP 127/77 | HR 85 | Temp 98.6°F | Wt 314.0 lb

## 2024-09-25 DIAGNOSIS — M0600 Rheumatoid arthritis without rheumatoid factor, unspecified site: Secondary | ICD-10-CM

## 2024-09-25 DIAGNOSIS — H209 Unspecified iridocyclitis: Secondary | ICD-10-CM | POA: Insufficient documentation

## 2024-09-25 DIAGNOSIS — D84821 Immunodeficiency due to drugs: Secondary | ICD-10-CM

## 2024-09-25 DIAGNOSIS — R569 Unspecified convulsions: Secondary | ICD-10-CM | POA: Insufficient documentation

## 2024-09-25 DIAGNOSIS — M352 Behcet's disease: Secondary | ICD-10-CM

## 2024-09-25 LAB — LAB USE ONLY - CBC WITH DIFFERENTIAL
Absolute Basophils: 0.03 x10 3/uL (ref 0.00–0.08)
Absolute Eosinophils: 0.19 x10 3/uL (ref 0.00–0.44)
Absolute Immature Granulocytes: 0.07 x10 3/uL (ref 0.00–0.07)
Absolute Lymphocytes: 4.66 x10 3/uL — ABNORMAL HIGH (ref 0.42–3.22)
Absolute Monocytes: 0.8 x10 3/uL (ref 0.21–0.85)
Absolute Neutrophils: 6.06 x10 3/uL (ref 1.10–6.33)
Absolute nRBC: 0 x10 3/uL (ref <=0.00)
Basophils %: 0.3 %
Eosinophils %: 1.6 %
Hematocrit: 40.4 % (ref 34.7–43.7)
Hemoglobin: 13.5 g/dL (ref 11.4–14.8)
Immature Granulocytes %: 0.6 %
Lymphocytes %: 39.5 %
MCH: 33.3 pg (ref 25.1–33.5)
MCHC: 33.4 g/dL (ref 31.5–35.8)
MCV: 99.5 fL — ABNORMAL HIGH (ref 78.0–96.0)
MPV: 8.1 fL — ABNORMAL LOW (ref 8.9–12.5)
Monocytes %: 6.8 %
Neutrophils %: 51.2 %
Platelet Count: 297 x10 3/uL (ref 142–346)
Preliminary Absolute Neutrophil Count: 6.06 x10 3/uL (ref 1.10–6.33)
RBC: 4.06 x10 6/uL (ref 3.90–5.10)
RDW: 13 % (ref 11–15)
WBC: 11.81 x10 3/uL — ABNORMAL HIGH (ref 3.10–9.50)
nRBC %: 0 /100{WBCs} (ref <=0.0)

## 2024-09-25 LAB — SEDIMENTATION RATE: Sed Rate: 5 mm/h (ref <=20)

## 2024-09-25 LAB — COMPREHENSIVE METABOLIC PANEL
ALT: 43 U/L (ref <=55)
AST (SGOT): 25 U/L (ref <=41)
Albumin/Globulin Ratio: 1.2 (ref 0.9–2.2)
Albumin: 3.9 g/dL (ref 3.5–4.9)
Alkaline Phosphatase: 41 U/L (ref 37–117)
Anion Gap: 11 (ref 5.0–15.0)
BUN: 12 mg/dL (ref 7–21)
Bilirubin, Total: 0.5 mg/dL (ref 0.2–1.2)
CO2: 24 meq/L (ref 17–29)
Calcium: 9.5 mg/dL (ref 8.5–10.5)
Chloride: 105 meq/L (ref 99–111)
Creatinine: 0.7 mg/dL (ref 0.4–1.0)
GFR: >60 mL/min/1.73 m2 (ref >=60.0)
Globulin: 3.2 g/dL (ref 2.0–3.6)
Glucose: 84 mg/dL (ref 70–100)
Hemolysis Index: 10 {index}
Potassium: 4.3 meq/L (ref 3.5–5.3)
Protein, Total: 7.1 g/dL (ref 6.0–8.3)
Sodium: 140 meq/L (ref 135–145)

## 2024-09-25 LAB — C-REACTIVE PROTEIN: C-Reactive Protein: 0.2 mg/dL (ref 0.0–1.1)

## 2024-09-25 MED ORDER — DIPHENHYDRAMINE HCL 50 MG/ML IJ SOLN
25.0000 mg | Freq: Once | INTRAMUSCULAR | Status: AC
Start: 2024-09-25 — End: 2024-09-25
  Administered 2024-09-25: 25 mg via INTRAVENOUS
  Filled 2024-09-25: qty 1

## 2024-09-25 MED ORDER — SODIUM CHLORIDE 0.9 % IV SOLN
250.0000 mL | INTRAVENOUS | Status: DC
Start: 2024-09-25 — End: 2024-09-25
  Administered 2024-09-25: 250 mL via INTRAVENOUS
  Filled 2024-09-25: qty 250

## 2024-09-25 MED ORDER — ACETAMINOPHEN 325 MG PO TABS
650.0000 mg | ORAL_TABLET | Freq: Once | ORAL | Status: AC
Start: 2024-09-25 — End: 2024-09-25
  Administered 2024-09-25: 650 mg via ORAL
  Filled 2024-09-25: qty 2

## 2024-09-25 MED ORDER — SODIUM CHLORIDE 0.9 % IV SOLN
690.0000 mg | Freq: Once | INTRAVENOUS | Status: AC
Start: 2024-09-25 — End: 2024-09-25
  Administered 2024-09-25: 690 mg via INTRAVENOUS
  Filled 2024-09-25: qty 690

## 2024-09-25 MED ORDER — METHYLPREDNISOLONE SODIUM SUCC 40 MG IJ SOLR (WRAP)
40.0000 mg | Freq: Once | INTRAMUSCULAR | Status: AC
Start: 2024-09-25 — End: 2024-09-25
  Administered 2024-09-25: 40 mg via INTRAVENOUS
  Filled 2024-09-25: qty 1

## 2024-09-25 NOTE — Progress Notes (Signed)
 Hailey [name] is a 43 y.o. female here for Treatment #14 infusion. Pt arrives ambulatory accompanied via wheelchair by self. Pt reports no new complaints today. She reports she had surgery and is currently in a boot. Pt inquiring regarding clarification stating she surgery 6 weeks ago. MD Woodard and MD [name] notified. Per MD ok to proceed with infusion provided she communicates with MD Do. Pt verbalizes understanding and OK to proceed.    >24 G R FA PIV placed by clin tech, +BBR prior to beginning infusion.     Pre-med :  PO Tylenol  650 mg & IV Benadryl  25 mg & IV Solu-Medrol  40 mg    Infliximab  tirtated and infused over 120 min without incident. Initially started @ 40 mL/hr x30 mins, then 80 mL/hr x30 mins, then 150 mL/hr, to a max rate of 250 mL/hr for the duration of the infusion.    PIV +BBR prior to removal, flushed and removed without issue, catheter intact. Occlusive dressing applied to site.    Vitals:    09/25/24 1112 09/25/24 1143 09/25/24 1213 09/25/24 1258   BP: 131/83 120/73 (!) 136/93 127/77   Pulse: 70 83 85    Temp: 98.6 F (37 C)      TempSrc: Oral      SpO2: 99% 98% 99% 99%   Weight:            - MAR ACTION REPORT  (last 24 hrs)           Bronson Bressman N, RN         Medication Name Action Time Action Site Route Rate Dose Reason Comments User     0.9% NaCl infusion 09/25/24 1009 New Bag  Intravenous 20 mL/hr 250 mL   Laporsche Hoeger N, RN     0.9% NaCl infusion 09/25/24 1250 Stopped  Intravenous     Carlos Heber N, RN     acetaminophen  (TYLENOL ) tablet 650 mg 09/25/24 1011 Given  Oral  650 mg   Elizbeth Posa N, RN     diphenhydrAMINE  (BENADRYL ) injection 25 mg 09/25/24 1011 Given  Intravenous  25 mg   Mizael Sagar N, RN     inFLIXimab -axxq (AVSOLA ) 690 mg in sodium chloride  0.9 % 250 mL infusion 09/25/24 1042 New Bag  Intravenous 40 mL/hr 690 mg   Herberto Ledwell N, RN     inFLIXimab -axxq (AVSOLA ) 690 mg in sodium chloride  0.9 % 250 mL infusion 09/25/24 1112 No  Dual Sign - Rate/Dose Change  Intravenous 80 mL/hr    Kaley Jutras N, RN     inFLIXimab -axxq (AVSOLA ) 690 mg in sodium chloride  0.9 % 250 mL infusion 09/25/24 1142 No Dual Sign - Rate/Dose Change  Intravenous 150 mL/hr    Braeden Kennan N, RN     inFLIXimab -axxq (AVSOLA ) 690 mg in sodium chloride  0.9 % 250 mL infusion 09/25/24 1212 No Dual Sign - Rate/Dose Change  Intravenous 250 mL/hr    Shyne Lehrke N, RN     inFLIXimab -axxq (AVSOLA ) 690 mg in sodium chloride  0.9 % 250 mL infusion 09/25/24 1242 Stopped  Intravenous     Jeri Rawlins N, RN     methylPREDNISolone  sodium succinate (Solu-MEDROL ) injection 40 mg 09/25/24 1011 Given  Intravenous  40 mg   Ohana Birdwell N, RN                  See MAR, VS, Doc Flowsheets for more details.  Pt tolerated the infusion well without problems.    Pt  discharged stable, NAD.    RTC 11/20/2024 @ 0815    Arlyne LOISE Drilling, RN BSN

## 2024-09-27 ENCOUNTER — Other Ambulatory Visit: Payer: Self-pay

## 2024-09-27 LAB — VALPROIC ACID LEVEL, TOTAL AND FREE
Valproic Acid Free: 5.7 mg/L (ref 4.8–17.3)
Valproic Acid Level: 61.5 mg/L (ref 50.0–100.0)

## 2024-09-27 NOTE — Progress Notes (Signed)
 Specialty Pharmacy Refill Note    Hailey [name] is a 43 y.o. female, who is being followed by [facility] team for management of: RxSp Migraine (Enrolled) for the following services:  Refill Management  Benefits and PA Management  Refill Management  Clinical Management    Medications monitored by Specialty Pharmacy:   Fremanezumab -vfrm (Ajovy ) 225 MG/1.5ML Solution Auto-injector   Inject 1 pen into the skin every 30 (thirty) days    botulinum toxin type A  (BOTOX ) 200 units injection   INJECT 155 UNITS INTRAMUSCULARLY EVERY 12 WEEKS    Rimegepant Sulfate  75 MG Tablet Dispersible   TAKE 1 TABLET (75 MG) BY MOUTH AS NEEDED (MIGRAINE)      Refill Coordination  Medications Linked to Program: Fremanezumab -vfrm (Ajovy ); Rimegepant Sulfate   HIPAA verified (patient name & dob or patient name & address) with approved contact: Yes  HIPAA verification: please specify: refill questionnaire  How has _______ (specialty medication) helped you manage your _______ (condition) from very well to very poor: Well  Changes to allergies?: No  Changes to medications, herbals or supplements?: No  New conditions (diagnosis) since last pharmacy outreach: No  Since the last fill, has the patient experienced any unplanned office visit, urgent care, emergency room, or hospital admission: No  Since the last fill, any new or worsened side effects?: No  Financial problems or insurance changes : No  Since the last fill, has the patient missed any doses of their specialty medication : No  Since your last fill, how many doses of your specialty medications were missed?: 0  Doses left of specialty medications: 6  Does the patient have the correct number of remaining doses: Yes  Patient confirmations: received welcome packet, patient bill of rights, & privacy practices: Patient has received all  Patient confirmation: patient previously received the drug monograph(s) for their specialty medication(s): Yes    Delivery  Information  Delivery confirmation: signature required for delivery: No  Delivery method: Courier  Delivery confirmation: delivery address: Delivery Address Reviewed & Accurate  Enter delivery address: [address]  [place], [place] [address]  Delivery phone number: [phone]  Delivery confirmation: patient informed of and confirms delivery date. Enter Delivery date: : 10/04/24  Preferred time?: Anytime  Is there any medication that is due not being filled?: No  Supplies needed?: No supplies needed  Does patient have any concerns about safely storing medications (at the correct temperature, away from children/pets,etc.)?: No  Do any medications need mixed or dated?: No  Financial confirmation: patient informed of financial responsibility and copay amount: Yes  Financial responsibility: copay amount: $0  Copay form of payment: Credit card on file  Questions or concerns for the pharmacist?: No  Are any medications first time fills?: No  Additional assessment notes : 54ml/30 $0, 16/30 $0        Medicare Part B Fill? No    Lisseth Cuellar

## 2024-09-29 ENCOUNTER — Other Ambulatory Visit: Payer: Self-pay

## 2024-09-29 LAB — INFLIXIMAB LEVEL AND ANTI-DRUG ANTIBODY FOR IBD
Infliximab Antibody: <10 [AU] (ref <10)
Infliximab Level: 7.9 ug/mL

## 2024-10-02 ENCOUNTER — Other Ambulatory Visit: Payer: Self-pay

## 2024-10-02 ENCOUNTER — Ambulatory Visit (INDEPENDENT_AMBULATORY_CARE_PROVIDER_SITE_OTHER): Payer: BLUE CROSS/BLUE SHIELD | Admitting: Family Medicine

## 2024-10-02 ENCOUNTER — Encounter (INDEPENDENT_AMBULATORY_CARE_PROVIDER_SITE_OTHER): Payer: Self-pay | Admitting: Family Medicine

## 2024-10-02 VITALS — BP 142/98 | HR 89 | Temp 99.9°F | Wt 314.0 lb

## 2024-10-02 DIAGNOSIS — J3089 Other allergic rhinitis: Secondary | ICD-10-CM

## 2024-10-02 DIAGNOSIS — F419 Anxiety disorder, unspecified: Secondary | ICD-10-CM

## 2024-10-02 DIAGNOSIS — Z0000 Encounter for general adult medical examination without abnormal findings: Secondary | ICD-10-CM

## 2024-10-02 DIAGNOSIS — M461 Sacroiliitis, not elsewhere classified: Secondary | ICD-10-CM

## 2024-10-02 DIAGNOSIS — Z889 Allergy status to unspecified drugs, medicaments and biological substances status: Secondary | ICD-10-CM

## 2024-10-02 DIAGNOSIS — R110 Nausea: Secondary | ICD-10-CM

## 2024-10-02 DIAGNOSIS — Z1331 Encounter for screening for depression: Secondary | ICD-10-CM

## 2024-10-02 DIAGNOSIS — Z23 Encounter for immunization: Secondary | ICD-10-CM

## 2024-10-02 DIAGNOSIS — F40243 Fear of flying: Secondary | ICD-10-CM

## 2024-10-02 DIAGNOSIS — B001 Herpesviral vesicular dermatitis: Secondary | ICD-10-CM

## 2024-10-02 DIAGNOSIS — K811 Chronic cholecystitis: Secondary | ICD-10-CM

## 2024-10-02 MED ORDER — EPIPEN 2-PAK 0.3 MG/0.3ML IJ SOAJ
0.3000 mg | Freq: Once | INTRAMUSCULAR | 2 refills | Status: AC | PRN
Start: 2024-10-02 — End: ?

## 2024-10-02 MED ORDER — VALACYCLOVIR HCL 1 G PO TABS
ORAL_TABLET | ORAL | 3 refills | Status: AC
Start: 2024-10-02 — End: ?

## 2024-10-02 MED ORDER — DIAZEPAM 5 MG PO TABS: 5.0000 mg | ORAL_TABLET | Freq: Four times a day (QID) | ORAL | 0 refills | Status: AC | PRN

## 2024-10-02 MED ORDER — COLESEVELAM HCL 625 MG PO TABS
1250.0000 mg | ORAL_TABLET | Freq: Every morning | ORAL | 3 refills | Status: AC
Start: 2024-10-02 — End: ?

## 2024-10-02 MED ORDER — METHOCARBAMOL 750 MG PO TABS
750.0000 mg | ORAL_TABLET | Freq: Three times a day (TID) | ORAL | 2 refills | Status: AC | PRN
Start: 2024-10-02 — End: ?

## 2024-10-02 MED ORDER — ONDANSETRON 8 MG PO TBDP
8.0000 mg | ORAL_TABLET | Freq: Three times a day (TID) | ORAL | 3 refills | Status: AC | PRN
Start: 2024-10-02 — End: ?

## 2024-10-02 MED ORDER — BUSPIRONE HCL 5 MG PO TABS
5.0000 mg | ORAL_TABLET | Freq: Two times a day (BID) | ORAL | 3 refills | Status: AC
Start: 2024-10-02 — End: ?

## 2024-10-02 MED ORDER — HYDROXYZINE HCL 10 MG PO TABS
10.0000 mg | ORAL_TABLET | Freq: Every evening | ORAL | 3 refills | Status: AC
Start: 2024-10-02 — End: ?

## 2024-10-02 NOTE — Progress Notes (Signed)
 Subjective:      Patient ID: Hailey [name] is a 43 y.o. female.    Chief Complaint:  Chief Complaint   Patient presents with    Annual Exam     Fasting.        HPI  Visit Type: Health Maintenance Visit  Work Status: working full-time  Reported Health: good health  Reported Diet: moderate compliance with well-balanced diet  Reported Exercise: none  Dental: regular dental visits twice a year  Vision: glasses, contact lenses, and regular eye exams   Hearing: normal hearing  Immunization Status: immunizations up to date  Reproductive Health: sexually active and s/p hysterectomy  Prior Screening Tests: no previous colorectal cancer screening, pap smear 1 year ago, and mammogram 1 year ago  General Health Risks: Adopted, no fhx  Safety Elements Used: uses seat belts and smoke detectors in household  Depression Screening: Little interest or pleasure in doing things: 0 (10/02/2024  1:08 PM)  Feeling down, depressed, or hopeless: 0 (10/02/2024  1:08 PM)  PHQ Total Score: 0 (10/02/2024  1:08 PM)      Problem List:  Patient Active Problem List   Diagnosis    Morbid obesity (CMS/HCC)    Herpes labialis    Non-seasonal allergic rhinitis, unspecified trigger    Migraine with aura and without status migrainosus, not intractable    Post-cholecystectomy syndrome    History of Wolff-Parkinson-White (WPW) syndrome    Scoliosis deformity of spine    Behcet recurrent disease (CMS/HCC)    Immunocompromised state due to drug therapy    S/P hysterectomy    Premature ventricular contractions (PVCs) (VPCs)    Palpitations    NICM (nonischemic cardiomyopathy) (CMS/HCC)    PVC's (premature ventricular contractions)    Abdominal pain, unspecified abdominal location    Seronegative rheumatoid arthritis (CMS/HCC)    Anterior uveitis    Generalized abdominal pain    Speech abnormality    Post-dural puncture headache    Diarrhea, unspecified type    Nausea    Altered bowel habits    High risk medication use    Seizure (CMS/HCC)    Anxiety        Current Medications:  Outpatient Medications Marked as Taking for the 10/02/24 encounter (Office Visit) with [name], MD   Medication Sig Dispense Refill    Apremilast  (Otezla ) 30 MG Tablet Take 1 tablet (30 mg) by mouth 2 (two) times daily 180 tablet 1    Azelastine -Fluticasone  137-50 MCG/ACT Suspension 1 spray by Nasal route 2 (two) times daily 23 g 5    botulinum toxin type A  (BOTOX ) 200 units injection INJECT 155 UNITS INTRAMUSCULARLY EVERY 12 WEEKS 1 each 3    clindamycin  (CLEOCIN  T) 1 % lotion Apply topically once every morning      cyanocobalamin  1000 MCG tablet Take 1 tablet (1,000 mcg) by mouth once daily 30 tablet 0    dicyclomine  (BENTYL ) 20 MG tablet TAKE 1 TABLET (20 MG) BY MOUTH EVERY 6 (SIX) HOURS (Patient taking differently: Take 1 tablet (20 mg) by mouth as needed) 360 tablet 3    divalproex , ER, extended release (DEPAKOTE  ER) 500 MG 24 hr tablet Take 1 tablet (500 mg) by mouth 3 (three) times daily 270 tablet 3    eletriptan  (RELPAX ) 40 MG tablet Take 1 tablet (40 mg) by mouth as needed for Migraine .  Wait at least 2 hours prior to taking second dose.  Max 2 tabs/24 hours. Do not use more than 3 days  per week. 12 tablet 5    folic acid  (FOLVITE ) 1 MG tablet Take 1 tablet (1,000 mcg) by mouth once daily 90 tablet 1    Fremanezumab -vfrm (Ajovy ) 225 MG/1.5ML Solution Auto-injector Inject 1 pen into the skin every 30 (thirty) days 1.5 mL 11    inFLIXimab  100 MG injection Infuse into the vein once every eight weeks      Lacosamide  (VIMPAT ) 200 MG Tablet Take 1 tablet (200 mg) by mouth 2 (two) times daily 180 tablet 1    lidocaine -prilocaine (EMLA) cream Apply topically as needed Dermatology procedures      Melatonin-Pyridoxine (MELATIN PO) Take 4 mg by mouth once at bedtime (Patient taking differently: Take 1 mg by mouth once at bedtime)      methotrexate  2.5 MG tablet TAKE 2 TABLETS (5 MG) BY MOUTH ONCE A WEEK (Patient taking differently: Take 4 tablets (10 mg) by mouth once a week)  24 tablet 1    modafinil  (PROVIGIL ) 100 MG tablet Take 0.5 tablets (50 mg) by mouth once every morning 30 tablet 1    Multiple Vitamin (MULTIVITAMIN PO) Take by mouth once daily      Naltrexone HCl, Pain, (Naltrex) 1.5 MG Cap Take by mouth      omeprazole  (PriLOSEC) 40 MG capsule Take 1 capsule (40 mg) by mouth once daily In the morning 30 mins before eating 90 capsule 1    predniSONE  (DELTASONE ) 10 MG tablet TAKE 1 TABLET BY MOUTH EVERY DAY 30 tablet 5    Rimegepant Sulfate  75 MG Tablet Dispersible TAKE 1 TABLET (75 MG) BY MOUTH AS NEEDED (MIGRAINE) 16 each 11    VITAMIN D  PO Take 1 tablet by mouth once daily Dosage unsure      [DISCONTINUED] busPIRone  (BUSPAR ) 5 MG tablet Take 1 tablet (5 mg) by mouth 2 (two) times daily 180 tablet 3    [DISCONTINUED] colesevelam  (WELCHOL ) 625 MG tablet TAKE 2 TABLETS (1,250 MG) BY MOUTH EVERY MORNING 180 tablet 3    [DISCONTINUED] diazePAM  (VALIUM ) 5 MG tablet Take 1 tablet (5 mg) by mouth every 6 (six) hours as needed for Anxiety (take 1/2 hour prior to the MRI) 5 tablet 0    [DISCONTINUED] EPINEPHrine  (EPIPEN  2-PAK) 0.3 MG/0.3ML Solution Auto-injector injection Inject 0.3 mLs (0.3 mg) into the muscle once as needed (Anaphylaxis) 2 each 2    [DISCONTINUED] hydrOXYzine  (ATARAX ) 10 MG tablet TAKE 1 TABLET BY MOUTH EVERY DAY NIGHTLY 90 tablet 3    [DISCONTINUED] methocarbamol  (ROBAXIN ) 750 MG tablet Take 1 tablet (750 mg) by mouth 4 (four) times daily (Patient taking differently: Take 1 tablet (750 mg) by mouth as needed) 30 tablet 2    [DISCONTINUED] ondansetron  (ZOFRAN -ODT) 8 MG disintegrating tablet DISSOLVE 1 TABLET (8 MG) IN THE MOUTH EVERY 8 (EIGHT) HOURS AS NEEDED FOR NAUSEA 90 tablet 3    [DISCONTINUED] valACYclovir  (VALTREX ) 1000 MG tablet Take 2 tabs at onset; repeat once in 12 hours.  Total 4 tabs per outbreak (Patient taking differently: as needed Take 2 tabs at onset; repeat once in 12 hours.  Total 4 tabs per outbreak) 12 tablet 3     Current Facility-Administered  Medications for the 10/02/24 encounter (Office Visit) with [name], MD   Medication Dose Route Frequency Provider Last Rate Last Admin    onabotulinumtoxin A (BOTOX ) injection 175 Units  175 Units Intramuscular Q12 Weeks Kastl, Charlotte M, MD   175 Units at 09/16/24 1150       Allergies:  Allergies  Allergen Reactions    Fruit Blend Flavor [Flavoring Agent (Non-Screening)] Anaphylaxis and Swelling     Tropical Fruit     Pineapple Anaphylaxis    Latex Drug-Induced Flushing, Hives, Other (See Comments), Swelling, Itching and Rash    Cyanoacrylate Itching and Rash    Other Itching and Rash     Sutures Monocryl    Oxycodone Hallucinations, Hives and Other (See Comments)     Hallucinations, took 1 time 25 years ago    Wound Dressing Adhesive Itching and Other (See Comments)     adhesive       Past Medical History:  Past Medical History:   Diagnosis Date    Abdominal hernia 2016    Resolved by surgical repair    Abnormal vision March 2024    reading glasses    Amenorrhea 1999    Resolved by hysterectomy    Anxiety 11/07/18    Arrhythmia     WPW ablation in 2005, 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 Released from cardiology    Atrial flutter (CMS/HCC) 20+ years    Previous history of WPW    Behcet's disease (CMS/HCC)     She sees Dr. 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    02/11/23 stable on medical    Diarrhea     Dizziness 15+ years    Allergies, migraine    Ear, nose and throat disorder     seasonal allergies    Fatigue 2019    Bechet's    Fever 11/09/2022    Recurrent fever due to Behcet's Disease    Fracture of hand 2016    Right hand middle finger    Hard to intubate 2019    Headache 2002    migraine    History of adverse reaction to anesthesia 2005    Nausea    History of ulcer disease 2019    Bechets Disease    Hx of ovarian cyst     Lower back pain     not severe     Meningitis 2011    Viral // treated and resolved    Migraine 2002    Near syncope 1995    Previous history of WPW    Other physical therapy March 2023    Neck    Pain in joint 2019    RA part of Bechets Disease    Pain in wrist 11/09/2022    Palpitations 20+ years    Previous history of WPW    Post-operative nausea and vomiting 2005    Rash 2019    Behcets disease - controlled by medication    Rheumatoid arthritis (CMS/HCC) 2019    Part of Behcets Disease    Scoliosis 1985    moderate// used a Brace at a young age    Shingles 2015    Swelling of extremity 2019    Left ankle    Uveitis     using eyedrops       Past Surgical History:  Past Surgical History:   Procedure Laterality Date    ABDOMINAL SURGERY  2016; 2012; 2024    Umbilical hernia; hysterectomy    ABLATION - PVC'S N/A 06/08/2022    Procedure: Ablation - PVC's;  Surgeon: Von Oh, MD;  Location: [facility];  Service: Cardiovascular;  Laterality: N/A;  SAME DAY DISCHARGE  CARTO NO TEE    ARTHROTOMY, WRIST Right 12/03/2020    Procedure: RIGHT UPPER EXTREMITY SYNOVIAL BIOPSY;  Surgeon: Bolling Oh, MD;  Location: [place] [facility];  Service: Plastics;  Laterality: Right;    BIOPSY, LYMPH NODE N/A 07/23/2018    Procedure: BIOPSY, LYMPH NODE;  Surgeon: Gloris Wilkie FALCON, MD;  Location: [place] [facility];  Service: ENT;  Laterality: N/A;  NECK DEEP LYMPH NODE BIOPSY    BONE MARROW BIOPSY  2020    led to behcet's dx    CARDIAC ABLATION  2005    [facility]  at [facility] for Wolff-Parkinson-White    CARDIAC CATHETERIZATION  2005    CHOLECYSTECTOMY  2009    COLONOSCOPY, DIAGNOSTIC (SCREENING) N/A 03/21/2023    Procedure: COLONOSCOPY, DIAGNOSTIC (SCREENING);  Surgeon: Leontine Countryman, MD;  Location: [facility];  Service: Gastroenterology;  Laterality: N/A;    DILATION AND CURETTAGE OF UTERUS  2010    EGD N/A 03/21/2023    Procedure: EGD;  Surgeon: Leontine Countryman, MD;  Location: [facility];  Service: Gastroenterology;  Laterality: N/A;     EXCISION, SOFT TISSUE Left 09/12/2024    Procedure: EXCISION OF LEFT ANTEROLATERAL ANKLE MASS;  Surgeon: Myrle Lenis, MD;  Location: [facility];  Service: Orthopedics;  Laterality: Left;    FINGER GANGLION CYST EXCISION  04/2018    FOOT SURGERY  2014    Ganglion cyst removal from Achilles    GANGLION CYST EXCISION  2014    04/2018 cyst removed [facility] index finger[facility]    HAND SURGERY  January 2022    HERNIA REPAIR  2016    HYSTERECTOMY  2012    HYSTEROSCOPY  2011    indication: Abnormal bleeding    LAPAROSCOPIC, LYSIS, ADHESIONS N/A 11/09/2022    Procedure: LAPAROSCOPIC LYSIS OF ADHESIONS;  Surgeon: Jan, Ambareen G, MD;  Location: [facility];  Service: Gynecology;  Laterality: N/A;    LAPAROSCOPIC, SALPINGECTOMY Left 11/09/2022    Procedure: LAPAROSCOPIC, SALPINGECTOMY LEFT;  Surgeon: Madison Sedalia MATSU, MD;  Location: [facility];  Service: Gynecology;  Laterality: Left;    LAPAROSCOPY, DIAGNOSTIC N/A 11/09/2022    Procedure: LAPAROSCOPY, DIAGNOSTIC;  Surgeon: Madison Sedalia MATSU, MD;  Location: [facility];  Service: Gynecology;  Laterality: N/A;  [id number]    UMBILICAL HERNIA REPAIR  2016    UPPER GASTROINTESTINAL ENDOSCOPY  2009    UPPER GASTROINTESTINAL ENDOSCOPY  2009    WISDOM TOOTH EXTRACTION  1998       Family History:  Family History   Adopted: Yes       Social History:  Social History     Tobacco Use    Smoking status: Never     Passive exposure: Never    Smokeless tobacco: Never   Vaping Use    Vaping status: Never Used   Substance Use Topics    Alcohol use: Never    Drug use: Never          The following sections were reviewed this encounter by the provider:   Tobacco  Allergies  Meds  Problems  Med Hx  Surg Hx  Fam Hx           Review of Systems   Constitutional:  Negative for chills, fatigue and fever.   HENT:  Negative for congestion, ear pain, rhinorrhea and sore throat.    Eyes:  Negative for visual disturbance.   Respiratory:  Negative for cough, shortness of breath  and  wheezing.    Cardiovascular:  Negative for chest pain, palpitations and leg swelling.   Gastrointestinal:  Positive for nausea. Negative for abdominal pain, blood in stool, diarrhea and vomiting.   Genitourinary:  Negative for difficulty urinating, dysuria, hematuria, menstrual problem, vaginal bleeding, vaginal discharge and vaginal pain.   Musculoskeletal:  Negative for arthralgias and myalgias.   Skin:  Negative for rash.   Neurological:  Positive for headaches. Negative for dizziness, weakness, light-headedness and numbness.   Hematological:  Negative for adenopathy.   Psychiatric/Behavioral:  Negative for confusion.         Objective:   Vitals:  BP (!) 142/98 (BP Site: Left arm, Patient Position: Sitting, Cuff Size: Large)   Pulse 89   Temp 99.9 F (37.7 C) (Temporal)   Wt (!) 142.4 kg (314 lb)   LMP  (LMP Unknown)   SpO2 98%   BMI 49.18 kg/m     Physical Exam  Vitals reviewed.   Constitutional:       Appearance: Normal appearance.   HENT:      Head: Normocephalic.      Nose: Nose normal.      Mouth/Throat:      Mouth: Mucous membranes are moist.   Eyes:      Extraocular Movements: Extraocular movements intact.      Pupils: Pupils are equal, round, and reactive to light.   Cardiovascular:      Rate and Rhythm: Normal rate and regular rhythm.      Pulses: Normal pulses.      Heart sounds: Normal heart sounds. No murmur heard.  Pulmonary:      Effort: Pulmonary effort is normal. No respiratory distress.      Breath sounds: Normal breath sounds. No wheezing or rales.   Abdominal:      General: Bowel sounds are normal. There is no distension.      Palpations: Abdomen is soft.      Tenderness: There is no abdominal tenderness.   Musculoskeletal:         General: Normal range of motion.      Cervical back: Normal range of motion.   Lymphadenopathy:      Cervical: No cervical adenopathy.   Skin:     General: Skin is warm.   Neurological:      General: No focal deficit present.      Mental Status: She is alert.       Cranial Nerves: No cranial nerve deficit.   Psychiatric:         Mood and Affect: Mood normal.          Assessment/Plan:       1. Annual physical exam    2. Anxiety  - busPIRone  (BUSPAR ) 5 MG tablet; Take 1 tablet (5 mg) by mouth 2 (two) times daily  Dispense: 180 tablet; Refill: 3    3. Chronic cholecystitis  - colesevelam  (WELCHOL ) 625 MG tablet; Take 2 tablets (1,250 mg) by mouth once every morning  Dispense: 180 tablet; Refill: 3    4. History of allergic reaction  - EPINEPHrine  (EPIPEN  2-PAK) 0.3 MG/0.3ML Solution Auto-injector injection; Inject 0.3 mLs (0.3 mg) into the muscle once as needed (Anaphylaxis)  Dispense: 2 each; Refill: 2    5. Non-seasonal allergic rhinitis, unspecified trigger  - hydrOXYzine  (ATARAX ) 10 MG tablet; Take 1 tablet (10 mg) by mouth once every evening  Dispense: 90 tablet; Refill: 3    6. Sacroiliitis  - methocarbamol  (ROBAXIN ) 750 MG  tablet; Take 1 tablet (750 mg) by mouth 3 (three) times daily as needed (muscle cramps)  Dispense: 30 tablet; Refill: 2    7. Nausea  - ondansetron  (ZOFRAN -ODT) 8 MG disintegrating tablet; Dissolve 1 tablet (8 mg) in the mouth every 8 (eight) hours as needed for Nausea  Dispense: 90 tablet; Refill: 3    8. Herpes labialis  - valACYclovir  (VALTREX ) 1000 MG tablet; Take 2 tabs at onset; repeat once in 12 hours.  Total 4 tabs per outbreak  Dispense: 12 tablet; Refill: 3    9. Fear of flying  - diazePAM  (VALIUM ) 5 MG tablet; Take 1 tablet (5 mg) by mouth every 6 (six) hours as needed for Anxiety (take 1/2 hour prior to the MRI)  Dispense: 5 tablet; Refill: 0    10. Immunization due  - Tdap vaccine greater than or equal to 7yo IM    11. Encounter for screening for depression  -Negative screening, repeat next year.      Health Maintenance:  High BMI follow-up: Encouragement to Exercise. Recommend optimizing low carbohydrate diet efforts and obtaining at least 150 minutes of aerobic exercise per week. Recommend 20-25 grams of dietary fiber daily. Recommend  drinking at least 60-80 ounces of water  per day.  Immunizations UTD. Vision screening UTD. Dental Screening UTD. Mammogram screening is UTD. Gynecologist surveillance is UTD.      Return in about 1 year (around 10/02/2025) for Annual Physical.    Sherlean Ferrier, MD

## 2024-10-02 NOTE — Progress Notes (Signed)
 Have you seen any specialists/other providers since your last visit with us ?    Yes, [facility], Neurology, rheumatology, pain management, dermatology, ophthalmology, infusion.       The patient was informed that the following HM items are still outstanding:   There are no preventive care reminders to display for this patient.

## 2024-10-17 ENCOUNTER — Encounter: Payer: Self-pay | Admitting: Internal Medicine

## 2024-10-17 DIAGNOSIS — D84821 Immunodeficiency due to drugs: Secondary | ICD-10-CM

## 2024-10-17 DIAGNOSIS — H209 Unspecified iridocyclitis: Secondary | ICD-10-CM

## 2024-10-17 DIAGNOSIS — R569 Unspecified convulsions: Secondary | ICD-10-CM

## 2024-10-17 DIAGNOSIS — M352 Behcet's disease: Secondary | ICD-10-CM

## 2024-10-17 DIAGNOSIS — M0600 Rheumatoid arthritis without rheumatoid factor, unspecified site: Secondary | ICD-10-CM

## 2024-10-20 ENCOUNTER — Encounter (INDEPENDENT_AMBULATORY_CARE_PROVIDER_SITE_OTHER): Payer: Self-pay

## 2024-10-20 ENCOUNTER — Encounter (INDEPENDENT_AMBULATORY_CARE_PROVIDER_SITE_OTHER): Payer: BLUE CROSS/BLUE SHIELD | Admitting: Internal Medicine

## 2024-10-20 ENCOUNTER — Ambulatory Visit (INDEPENDENT_AMBULATORY_CARE_PROVIDER_SITE_OTHER): Payer: BLUE CROSS/BLUE SHIELD

## 2024-10-20 VITALS — BP 150/84 | HR 105 | Temp 98.6°F | Resp 20 | Ht 67.0 in | Wt 315.0 lb

## 2024-10-20 DIAGNOSIS — J029 Acute pharyngitis, unspecified: Secondary | ICD-10-CM

## 2024-10-20 DIAGNOSIS — M352 Behcet's disease: Secondary | ICD-10-CM

## 2024-10-20 DIAGNOSIS — J069 Acute upper respiratory infection, unspecified: Secondary | ICD-10-CM

## 2024-10-20 DIAGNOSIS — R051 Acute cough: Secondary | ICD-10-CM

## 2024-10-20 DIAGNOSIS — Z79899 Other long term (current) drug therapy: Secondary | ICD-10-CM

## 2024-10-20 LAB — STREP A POCT GOHEALTH: Rapid Strep A Screen POCT: NEGATIVE

## 2024-10-20 LAB — POCT INFLUENZA A/B
POCT Rapid Influenza A AG: NEGATIVE
POCT Rapid Influenza B AG: NEGATIVE

## 2024-10-20 LAB — POC COVID QUICKVUE ANTIGEN: QuickVue SARS COV2 Antigen POCT: NEGATIVE

## 2024-10-20 NOTE — Progress Notes (Unsigned)
  [facility]  OFFICE NOTE         Subjective   Historian: {Historian:72660}  {The patient needed interpreter services to provide history (Optional):29381}    Chief Complaint   Patient presents with    Sore Throat     Pt c/o sore throat, fever, cough, sinus congestion and  body ache X 3 days. She is an immunodeficiency patient.        History of Present Illness      History:  Medications and Allergies reviewed.   Pertinent Past Medical, Surgical, Family and Social History were reviewed.  {  Disappearing Text  Click a link below to be taken to that activity or part of the chart   Chart Review  Order Review  Review Flowsheets  Labs  Health Maintenance  Immunizations  Allergies  Medications  Problem List  History  Synopsis   :55325}      Objective     Vitals:    10/20/24 1312 10/20/24 1317   BP: (!) 139/108 150/84   BP Site: Left arm Right arm   Patient Position: Sitting Sitting   Cuff Size: Large Large   Pulse: (!) 108 (!) 105   Resp: 20    Temp: 98.6 F (37 C)    TempSrc: Oral    SpO2: 98%    Weight: (!) 142.9 kg (315 lb)    Height: 1.702 m (5' 7)       Body mass index is 49.34 kg/m.          Physical Exam   Physical Exam    Urgent Care Course   LABS  The following POCT tests were ordered, reviewed and discussed with the patient/family.     Results for orders placed or performed in visit on 10/20/24 (from the past 24 hours)   QuickVue SARS-COV-2 Antigen POCT    Collection Time: 10/20/24  1:46 PM   Result Value    QuickVue SARS COV2 Antigen POCT Negative   POCT Influenza A/B    Collection Time: 10/20/24  1:46 PM   Result Value    POCT QC Pass    POCT Rapid Influenza A AG Negative    POCT Rapid Influenza B AG Negative   Rapid Strep A POCT    Collection Time: 10/20/24  1:46 PM   Result Value    POCT QC Pass    Rapid Strep A Screen POCT Negative     There were no x-rays reviewed with this patient during the visit.  {Preliminary Read Documentation (Optional):71470}    Procedures    Procedures     Assessment / Plan     Differential Diagnoses including but not limited to: ***    Assessment & Plan         Hailey [name] was seen today for sore throat.    Diagnoses and all orders for this visit:    Sore throat  -     Rapid Strep A POCT; Future  -     Rapid Strep A POCT    Acute cough  -     QuickVue SARS-COV-2 Antigen POCT; Future  -     POCT Influenza A/B; Future  -     QuickVue SARS-COV-2 Antigen POCT  -     POCT Influenza A/B         The indications for early follow-up with PCP and return to UC were discussed. Patient/family received education on the working diagnosis, diagnostic uncertainties, and proposed treatment plan.  Indications for emergency evaluation in the ED were reviewed. Written and verbal discharge instructions were provided and discussed and all questions from the patient/family were addressed, with no apparent barriers.      Verbal consent obtained to record this visit.

## 2024-10-21 NOTE — Progress Notes (Signed)
 Patient emssaged the following:    Hi Dr. Woodard,     I went to urgent care today for sore throat and coughing and tested negative for flu, Covid, and strep but was diagnosed with a viral infection, most likely parainfluenza. The NP recommended I continue all medications but I wanted to check if I should skip the methotrexate  dose this week?     Thank you,  Hailey [name]    Response:    Hi Hailey [name],    If no fever and you are feeling better you do not necessarily need to hold your methtorexate or remicade  infusion if that is coming up.  If any fevers or progressively worsening, it is a good idea to hold or delay MTX at least a few days or a week.    Hope you feel better and enjoy the holidays!    -EM    Based on cumulative time spent addressing messages for an established patient up to 7 days, online digital evaluation and management service for 5 minutes has been performed.

## 2024-10-22 ENCOUNTER — Encounter: Payer: Self-pay | Admitting: Internal Medicine

## 2024-10-23 ENCOUNTER — Other Ambulatory Visit: Payer: Self-pay

## 2024-10-23 ENCOUNTER — Encounter: Payer: Self-pay | Admitting: Internal Medicine

## 2024-10-23 ENCOUNTER — Encounter (INDEPENDENT_AMBULATORY_CARE_PROVIDER_SITE_OTHER): Payer: Self-pay | Admitting: Internal Medicine

## 2024-10-24 ENCOUNTER — Encounter (INDEPENDENT_AMBULATORY_CARE_PROVIDER_SITE_OTHER): Payer: Self-pay

## 2024-10-24 ENCOUNTER — Ambulatory Visit (INDEPENDENT_AMBULATORY_CARE_PROVIDER_SITE_OTHER): Payer: BLUE CROSS/BLUE SHIELD | Admitting: Family Medicine

## 2024-10-24 ENCOUNTER — Ambulatory Visit (INDEPENDENT_AMBULATORY_CARE_PROVIDER_SITE_OTHER): Payer: BLUE CROSS/BLUE SHIELD

## 2024-10-24 VITALS — BP 148/88 | HR 89 | Temp 99.4°F | Resp 14 | Ht 67.0 in | Wt 315.0 lb

## 2024-10-24 DIAGNOSIS — J208 Acute bronchitis due to other specified organisms: Secondary | ICD-10-CM

## 2024-10-24 DIAGNOSIS — R051 Acute cough: Secondary | ICD-10-CM

## 2024-10-24 DIAGNOSIS — B9689 Other specified bacterial agents as the cause of diseases classified elsewhere: Secondary | ICD-10-CM

## 2024-10-24 MED ORDER — ALBUTEROL-BUDESONIDE 90-80 MCG/ACT IN AERO: 2.0000 | INHALATION_SPRAY | RESPIRATORY_TRACT | 0 refills | Status: AC | PRN

## 2024-10-24 MED ORDER — DOXYCYCLINE HYCLATE 100 MG PO TABS: 100.0000 mg | ORAL_TABLET | Freq: Two times a day (BID) | ORAL | 0 refills | Status: AC

## 2024-10-24 NOTE — Progress Notes (Signed)
 Hailey [name]  OFFICE NOTE         Subjective   Historian: Patient      Chief Complaint   Patient presents with    Cough     Was here on Sunday and tested for everything and was told to come back if it did not get better       Cough      Hailey [name] is a 43 y.o. female who presents for persistent cough and congestion x 1 week. Hx of immunocompromise. Denies fever or shortness of breath.     History:  Medications and Allergies reviewed.   Pertinent Past Medical, Surgical, Family and Social History were reviewed.        Objective     Vitals:    10/24/24 1040   BP: 148/88   BP Site: Right arm   Patient Position: Sitting   Cuff Size: X-Large   Pulse: 89   Resp: 14   Temp: 99.4 F (37.4 C)   TempSrc: Tympanic   SpO2: 99%   Weight: (!) 142.9 kg (315 lb)   Height: 1.702 m (5' 7)      Body mass index is 49.34 kg/m.          Physical Exam  Constitutional:       Appearance: Normal appearance.   HENT:      Head: Normocephalic and atraumatic.   Cardiovascular:      Rate and Rhythm: Normal rate and regular rhythm.   Pulmonary:      Breath sounds: Examination of the right-lower field reveals rales. Examination of the left-lower field reveals rales. Rales present.   Musculoskeletal:      Cervical back: Normal range of motion and neck supple.   Neurological:      Mental Status: She is alert.     Urgent [name] Course   There were no labs reviewed with this patient during the visit.    X-Ray  The following X-ray studies were ordered, visualized and independently interpreted by me. Results were discussed with the patient/family.     XR Chest 2 Views  Result Date: 10/24/2024  HISTORY: Cough and congestion. COMPARISON: 05/30/2024. FINDINGS: Support apparatus: None. Lungs / pleural space: No new focal lung opacity. No visible pleural effusion or pneumothorax. Mediastinum: Unchanged silhouette. Bones and other soft tissues: Degenerative changes of the imaged spine. No acute osseous abnormality.      No acute abnormality  within the chest. Elwood IVAR Miyamoto, MD 10/24/2024 11:38 AM    X-ray ordered and images reviewed. Preliminary reading: 2 view XR of CXRAY reveals no acute infiltrate. Results of x-ray discussed with the patient/family. Radiologist reading also reviewed and copy of report provided to patient.      Procedures   Procedures     Assessment / Plan     Differential Diagnoses including but not limited to: No evidence of pneumonia. Likely bacterial bronchitis given duration of sxs with worsening.     Doxy for bacterial coverage. Increased risk for pneumonia or bacterial infection with hx of immunocompromise. AirSupra  for bronchospasm and inflammation. Return to clinic in 3 days if symptoms fail to improve or worsen.       Hailey [name] was seen today for cough.    Diagnoses and all orders for this visit:    Acute bacterial bronchitis  -     doxycycline  (VIBRA -TABS) 100 MG tablet; Take 1 tablet (100 mg) by mouth 2 (two) times daily for 7 days  -  Albuterol -Budesonide  90-80 MCG/ACT Aerosol; Inhale 2 puffs into the lungs every 4 (four) hours as needed (cough, wheezing or shortness of breath.)    Acute cough  -     XR Chest 2 Views         The indications for early follow-up with PCP and return to UC were discussed. Patient/family received education on the working diagnosis, diagnostic uncertainties, and proposed treatment plan. Indications for emergency evaluation in the ED were reviewed. Written and verbal discharge instructions were provided and discussed and all questions from the patient/family were addressed, with no apparent barriers.

## 2024-10-29 ENCOUNTER — Other Ambulatory Visit: Payer: Self-pay

## 2024-10-29 NOTE — Progress Notes (Signed)
 Specialty Pharmacy Refill Note    Hailey [name] is a 43 y.o. female, who is being followed by [facility] team for management of: RxSp Migraine (Enrolled) for the following services:  Refill Management  Benefits and PA Management  Refill Management  Clinical Management    Medications monitored by Specialty Pharmacy:   Fremanezumab -vfrm (Ajovy ) 225 MG/1.5ML Solution Auto-injector   Inject 1 pen into the skin every 30 (thirty) days    botulinum toxin type A  (BOTOX ) 200 units injection   INJECT 155 UNITS INTRAMUSCULARLY EVERY 12 WEEKS    Rimegepant Sulfate  75 MG Tablet Dispersible   TAKE 1 TABLET (75 MG) BY MOUTH AS NEEDED (MIGRAINE)      Refill Coordination  Medications Linked to Program: Fremanezumab -vfrm (Ajovy ); Rimegepant Sulfate   HIPAA verified (patient name & dob or patient name & address) with approved contact: Yes  HIPAA verification: please specify: Christy L. Nease  How has _______ (specialty medication) helped you manage your _______ (condition) from very well to very poor: Well  Changes to allergies?: No  Changes to medications, herbals or supplements?: No  New conditions (diagnosis) since last pharmacy outreach: Yes  Since the last fill, has the patient experienced any unplanned office visit, urgent care, emergency room, or hospital admission: Yes  Since the last fill, any new or worsened side effects?: No  Explain any yes answers for changes in meds, allergies, dx, unplanned visits or new/worsening SDE or questions/concerns for the pharmacist: Urgent care visits 12/14 & 12/18 fo bronchitis. Will finish antibiotics this week.  Financial problems or insurance changes : No  Since the last fill, has the patient missed any doses of their specialty medication : No  Since your last fill, how many doses of your specialty medications were missed?: 0  Doses left of specialty medications: 0  Does the patient have the correct number of remaining doses: Yes  Patient confirmations: received  welcome packet, patient bill of rights, & privacy practices: Patient has received all  Patient confirmation: patient previously received the drug monograph(s) for their specialty medication(s): Yes    Delivery Information  Delivery confirmation: signature required for delivery: No  Delivery method: Courier  Delivery confirmation: delivery address: Delivery Address Reviewed & Accurate  Enter delivery address: [address]  [place], [place] [address]  Delivery phone number: [phone]  Delivery confirmation: patient informed of and confirms delivery date. Enter Delivery date: : 11/01/24  Preferred time?: Anytime  Along with the specialty medication listed in this request, are there any additional medication(s) you need refilled now?: No  Number of medications in delivery: 2  Supplies needed?: No supplies needed  Does patient have any concerns about safely storing medications (at the correct temperature, away from children/pets,etc.)?: No  Do any medications need mixed or dated?: No  Financial confirmation: patient informed of financial responsibility and copay amount: Yes  Financial responsibility: copay amount: $0  Copay form of payment: Credit card on file  Questions or concerns for the pharmacist?: No  Explain any yes answers for changes in meds, allergies, dx, unplanned visits or new/worsening SDE or questions/concerns for the pharmacist: Urgent care visits 12/14 & 12/18 fo bronchitis. Will finish antibiotics this week.  Are any medications first time fills?: No  Additional assessment notes : Ajovy  225mg /1.3ml 1.5/30 $0 , Nurtec 75mg  16/30 $0        Medicare Part B Fill? No    Krystofer Hevener

## 2024-10-30 ENCOUNTER — Other Ambulatory Visit: Payer: Self-pay

## 2024-11-03 NOTE — Progress Notes (Signed)
 HISTORY OF PRESENT ILLNESS:    Chief Complaint: Post-op of the Left Ankle    Patient ID: Hailey [name] is a 43 y.o. female who presents with Post-op of the Left Ankle    Surgery: Excision of left anterolateral ankle mass     DOS: 09/12/2024    Hailey [name] presents today 7.5 weeks status post surgery, performed 09/12/2024. She is doing well today post-operatively with no major complaints to report. She reports that she is feeling much better and no longer having the pain she experienced prior to surgery.  Patient is ambulating in shoes.    VAS PAIN SCORE: 1     OBJECTIVE:    Constitutional: No acute distress. Well nourished. Well developed. Body mass index is 46.07 kg/m.  Eyes: Sclera are nonicteric.  Psychiatric: Alert and oriented x3.  Respiratory: No labored breathing.  Skin: No marked skin ulcers.  Lymphatic: No significant distal lymphangitis.  Cardiovascular: Reveals no varicosities or pitting edema of either lower extremity.    Left Ankle     Left Ankle:  Her incisions are well healed. No drainage or erythema.   5/5 EHL, FHL, gastrocsoleus, anterior tibial, posterior tibial, and peroneal motor groups.   Intact sensation throughout the deep peroneal, superficial peroneal, tibial, sural, and saphenous nerve distributions.   Palpable dorsalis pedis and posterior tibial pulses.     IMAGING / STUDIES:           No imaging obtained     ASSESSMENT:    (R22.42) Ankle mass, left  (primary encounter diagnosis)  (M25.472) Left ankle swelling    PLAN:    She is 7.5 weeks status post excision of left ankle mass today. I reviewed the post-operative protocol with her. At this time, she appears to be progressing well.  She may progress activity as tolerated without restriction.  She is okay to submerge her foot.  Follow up PRN.     The findings of today's encounter have been reviewed with the patient. Questions have been invited and answered to her satisfaction. She has an excellent understanding and agrees with the  proposed plan.    No orders of the defined types were placed in this encounter.      IAlm Melnick, MD, personally, performed the services described in this documentation, as scribed in my presence, and it is both accurate and complete.  Scribed by: Thad Eagle

## 2024-11-10 ENCOUNTER — Other Ambulatory Visit (INDEPENDENT_AMBULATORY_CARE_PROVIDER_SITE_OTHER): Payer: Self-pay | Admitting: Physician Assistant

## 2024-11-11 ENCOUNTER — Telehealth (INDEPENDENT_AMBULATORY_CARE_PROVIDER_SITE_OTHER): Payer: BLUE CROSS/BLUE SHIELD | Admitting: Internal Medicine

## 2024-11-11 ENCOUNTER — Other Ambulatory Visit (INDEPENDENT_AMBULATORY_CARE_PROVIDER_SITE_OTHER): Payer: Self-pay | Admitting: Physician Assistant

## 2024-11-12 ENCOUNTER — Encounter: Payer: Self-pay | Admitting: Internal Medicine

## 2024-11-12 DIAGNOSIS — H209 Unspecified iridocyclitis: Secondary | ICD-10-CM

## 2024-11-12 DIAGNOSIS — M352 Behcet's disease: Secondary | ICD-10-CM

## 2024-11-12 DIAGNOSIS — D84821 Immunodeficiency due to drugs: Secondary | ICD-10-CM

## 2024-11-12 DIAGNOSIS — M0600 Rheumatoid arthritis without rheumatoid factor, unspecified site: Secondary | ICD-10-CM

## 2024-11-12 DIAGNOSIS — R569 Unspecified convulsions: Secondary | ICD-10-CM

## 2024-11-17 ENCOUNTER — Other Ambulatory Visit: Payer: Self-pay

## 2024-11-18 ENCOUNTER — Encounter (INDEPENDENT_AMBULATORY_CARE_PROVIDER_SITE_OTHER): Payer: Self-pay | Admitting: Internal Medicine

## 2024-11-18 ENCOUNTER — Other Ambulatory Visit: Payer: Self-pay

## 2024-11-18 ENCOUNTER — Telehealth (INDEPENDENT_AMBULATORY_CARE_PROVIDER_SITE_OTHER): Payer: BLUE CROSS/BLUE SHIELD | Admitting: Internal Medicine

## 2024-11-18 ENCOUNTER — Encounter: Payer: Self-pay | Admitting: Internal Medicine

## 2024-11-18 DIAGNOSIS — M0600 Rheumatoid arthritis without rheumatoid factor, unspecified site: Secondary | ICD-10-CM

## 2024-11-18 DIAGNOSIS — K120 Recurrent oral aphthae: Secondary | ICD-10-CM

## 2024-11-18 DIAGNOSIS — Z79899 Other long term (current) drug therapy: Secondary | ICD-10-CM

## 2024-11-18 DIAGNOSIS — D84821 Immunodeficiency due to drugs: Secondary | ICD-10-CM

## 2024-11-18 DIAGNOSIS — Z7185 Encounter for immunization safety counseling: Secondary | ICD-10-CM

## 2024-11-18 DIAGNOSIS — H209 Unspecified iridocyclitis: Secondary | ICD-10-CM

## 2024-11-18 DIAGNOSIS — R569 Unspecified convulsions: Secondary | ICD-10-CM

## 2024-11-18 DIAGNOSIS — M352 Behcet's disease: Secondary | ICD-10-CM

## 2024-11-18 MED ORDER — OTEZLA 30 MG PO TABS: 1.0000 | ORAL_TABLET | Freq: Two times a day (BID) | ORAL | 1 refills | Status: AC

## 2024-11-18 NOTE — Patient Instructions (Addendum)
 Dear Hailey [name] ,     Thanks for arranging a video visit with me.    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 We also reviewed your recent lab results and addressed your inquiry about the meningitis vaccine.    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 We will increase your Remicade  infusion frequency to every six weeks to help manage your joint pain and stiffness. Please continue your current methotrexate  regimen. We will also consult with Dr. [name] regarding the Remicade  dosing interval.    -SERONEGATIVE RHEUMATOID ARTHRITIS: Seronegative rheumatoid arthritis is a type of arthritis that causes joint pain and inflammation but does not show certain antibodies in the blood. We will increase your Remicade  infusion frequency to every six weeks to help manage your symptoms. Please continue your current methotrexate  regimen.    -POSTOPERATIVE CARE FOLLOWING LEFT ANKLE LIPOMA EXCISION: You are recovering well from your left ankle lipoma excision with improved mobility and reduced pain and swelling. The incision site is still tender but healing. The pathology confirmed it was a benign lipoma. Please follow up with your surgeon if any issues arise.    -GENERAL HEALTH MAINTENANCE: Your recent lab results from September 25, 2024, were reviewed and are within normal ranges. 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    INSTRUCTIONS:  Increase your Remicade  infusion frequency to every six weeks. Continue your current methotrexate  regimen. Follow up with Dr. [name] regarding the Remicade  dosing interval. Follow up  with your surgeon if any issues arise with your left ankle. Consider getting the meningitis vaccine, especially given your past episode of viral meningitis.            You may retrieve any orders from this visit in Mychart by using these steps:    After signing into Mychart  1) click Messaging  2) click Letters  3) select the Requisition  4) Review and click print    Please contact the clinic at [phone] with any questions and to schedule your follow up visit.    Sincerely,    Geni Derby, MD  Rheumatologist  [facility]  [address]  [place], [place] [address]  Phone: [phone]  Fax: [phone]

## 2024-11-18 NOTE — Progress Notes (Signed)
 RHEUMATOLOGY FOLLOW UP NOTE  Telemedicine Documentation Requirements  Originating site (Patient location): home  Distant site (Provider location): Office  Provider and Title: Dr. Woodard  Type of Visit: Video Visit  Verbal Consent has been obtained to conduct a telemedicine visit on this date in order to minimize exposure to COVID-19.          PCP: [name], MD    HPI:    This patient is a 44 y.o. year female with Past Medical History of Prior Cholecystectomy, Wold-Parkinson White, Prior Hysterectomy who returns for follow up of Behcet's HLAB51+, recurrent oral ulcers, pathergy, low grade fevers and seronegative arthritis, uveitis  -ablation procedure for arrythmia 06/2022  -Was on Humira -lost efficacy and developed uveitis  -Otezla  for recurrent aphthous ulcers  -She is on dapsone from Dermatology, clindamycin  at night for skin lesions  Dermatologist started her on dapsone-she has had a few scars that are healing-had injections from her dermatologist.  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 Avelina Aspen  Sees Dr. 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    She is managing Behet's disease with Remicade  infusions, low dose methotrexate , and Otezla . Her last Remicade  infusion was on September 25, 2024. She experiences worsening joint pain and stiffness in the week before her Remicade  infusion, and reports improvement after the infusion. Joint symptoms also improved while on doxycycline  for bronchitis, possibly due to its anti-inflammatory properties.    In November, she underwent surgery for the excision of a left ankle mass, identified as a lipoma. Recovery was prolonged, requiring  a boot for a month, including during travel. She is now out of the boot and discharged by the surgeon, with improved pain and mobility, though the incision site remains tender.    In December, she developed a viral illness after traveling, which progressed to bacterial bronchitis. Treatment with doxycycline  and an albuterol -budesonide  inhaler improved her symptoms significantly, though a residual cough persists. She is scheduled to see a pulmonologist at the end of January.    Recent lab work from September 25, 2024, showed an infliximab  level of 7.9, negative infliximab  antibodies, a valproate acid level of 5.7, CRP of 0.2, ESR of 5, and a comprehensive metabolic panel with creatinine at 0.7, AST at 25, ALT at 43, and alkaline phosphatase at 41. Her CBC showed a WBC of 11.81, hemoglobin of 13.5, hematocrit of 40.4, and platelet count of 297.    She inquires about the meningitis vaccine, noting she never received it, and mentions a past episode of viral meningitis. She is unsure if she received the vaccine during her school years.      She has joint symptom sreturn a couple weeks prior to her next remicade  dose, is concerned it is wearing off prior to her next dose.  Results  LABS  CRP: <0.1 (02/06/2024)  ESR: 3 (02/06/2024)  WBC: 11.19 (02/06/2024)  Hb: 15.2 (02/06/2024)  Hct: 44.3 (02/06/2024)  PLT: 285 (02/06/2024)  Cr: 0.9 (02/06/2024)  AST: 27 (02/06/2024)  ALT: 37 (02/06/2024)  Alkaline phosphatase: 43 (02/06/2024)  Infliximab  antibodies: low (02/06/2024)    RADIOLOGY  Lower extremity venous ultrasound: No DVT (03/07/2024)  Ankle MRI: Soft tissue swelling,  acute inflammation in tendon, chronic tendon changes (12/2023)      LABS  CRP: <0.1 (06/05/2024)  ESR: 8 (06/05/2024)  Comprehensive metabolic panel: Creatinine 0.7, AST 30, ALT 50, Alkaline phosphatase 48 (06/05/2024)  CBC: WBC 10.6, Hemoglobin 14.4, Hematocrit 42.2, Platelet count 305 (06/05/2024)  Valproic acid : 10.3 (06/05/2024)    RADIOLOGY  MRI cervical  spine: No cervical cord signal abnormality or abnormal enhancement. Degenerative changes greatest at C5-C6 with moderate right neuroforaminal stenosis. No significant spinal canal stenosis. (06/04/2024)  MRI brain: No intracranial mass, acute hemorrhage, acute infarction, or evidence of hydrocephalus. Unremarkable. (06/04/2024)  MRI thoracic spine: Negative enhanced MRI evaluation of the thoracic spinal cord and conus. Mild generalized thoracic spondylosis without central stenosis. Rotatory thoracic dextroscoliosis. Otherwise unremarkable. (06/04/2024)      LABS  Hemoglobin A1c: 5.4 (09/02/2024)  Creatinine: 0.9 (09/02/2024)  WBC: 14.78 (09/02/2024)  Hemoglobin: 14.1 (09/02/2024)  Hematocrit: 43.0 (09/02/2024)  Platelet count: 352 (09/02/2024)  ESR: 3 (07/31/2024)  CRP: 0.1 (07/31/2024)  Valproic acid : 7.8 (07/31/2024)  Infliximab : 16 (07/31/2024)  AST: 20 (07/31/2024)  ALT: 29 (07/31/2024)  Alkaline phosphatase: 44 (07/31/2024)  WBC: 11.9 (07/31/2024)  Hemoglobin: 14.1 (07/31/2024)  Hematocrit: 42.0 (07/31/2024)  Platelet count: 295 (07/31/2024)    RADIOLOGY  CT chest high resolution: No acute chest pathology. Incidental increased hepatic parenchymal density concerning for metal deposition disorder. Scarring of right middle lobe, lingula, 2 cm oval lesion posterior to heart in left lower lobe mildly calcified consistent with granuloma or hematoma. No new lung infiltrate. (08/20/2024)  Left ankle x-ray: Ankle mortise well aligned. (08/12/2024)  CT neck soft tissue: Calcification adjacent to medial posterior aspect of right submandibular gland without stone or enlarged lymph nodes. (07/18/2024)      Labs  Infliximab  level (09/25/2024): 7.9  Infliximab  antibody (09/25/2024): Negative  Valproic acid  (09/25/2024): 5.7  CRP (09/25/2024): 0.2  ESR (09/25/2024): 5  Comprehensive metabolic panel (09/25/2024): Creatinine 0.7, AST 25, ALT 43, Alkaline phosphatase 41  CBC (09/25/2024): WBC 11.81, hemoglobin 13.5, hematocrit  40.4, platelet count 297    Pathology  Left ankle mass pathology (09/2024): Lipoma, no evidence of malignancy            Previous Labs:  12/12/23  CMP AST/ALT 45/74  ESR wnl  CRP wnl  Infliximab  level 13  CBC WBC 14; H/H 15.5/45.9      Previous Labs:  10/17/23  CMP ALT 69  ESR wnl  CRP wnl  CBC WBC 10        Imaging Reports:  01/03/24  MRI left ankle.    Previous Imaging:  10/16/23  CT L Spine:  FINDINGS:   Scoliotic alignment is present. On sagittal imaging, there is no  significant listhesis. Vertebral body heights are maintained. There is no  fracture. There is no lytic or blastic lesion.     Endplate spurring is present throughout the lumbar spine. Varying degrees  of disc height loss are present. There is no obvious large disc bulge or  protrusion. Facet and spinous process hypertrophy noted.     The surrounding soft tissues are normal.     IMPRESSION:      1. No acute process, stable appearance.  2. No fracture.  3. Degenerative findings are present but there is no appreciable large disc  bulge or protrusion      Previous Labs:  08/22/23  CRP wnl  ESR wnl  CMP AST/ALT 02/08/81  Infliximab  Ab 14  Valproic Acid  9.0      Previous Labs:  06/26/23  Inflizimab level pending  Valproic Acid  level 75  CRP wnl  ESR wnl  CMP AST/ALT 53/93    Previous Labs:  05/01/23  Infliximab  level wnl  CRP wnl  ESR 14  CMP wnl  CBC wnl    Previous Imaging:  07/25/23:  FINDINGS:     Right ankle/foot: No significant joint effusion or synovitis or marginal  erosion is noted in the right ankle, midfoot, or forefoot. No tenosynovitis  noted.     Left ankle/foot: No significant joint effusion, synovitis, or marginal  erosion is noted in the left ankle, midfoot, or forefoot. No tenosynovitis  noted.     IMPRESSION:         1.No joint effusion, synovitis, or obvious joint erosion in either right or  left ankle/foot    Previous Imaging:  03/01/23  MRI Brain:  FINDINGS:      The brain parenchyma demonstrates no significant abnormality. There is  no  abnormal parenchymal enhancement. No parenchymal mass lesion, acute  parenchymal hemorrhage, or acute infarction.     The ventricles, sulci, and cisterns are unremarkable in appearance.     The flow voids of the major intracranial vessels appear intact.     The bones and extracranial soft tissues are unremarkable.     IMPRESSION:      No intracranial mass, acute hemorrhage, acute infarction, or evidence of  hydrocephalus. Normal MRI of the brain    02/10/23:  Neck US :  Narrative & Impression   HISTORY: Neck swelling. Enlarged cervical lymph nodes.      COMPARISON: 01/28/2023     TECHNIQUE: Targeted soft tissue ultrasound of the area of concern, in the  area of both parotid glands and at level 1.     FINDINGS:   No enlarged or otherwise abnormal appearing lymph node, other mass, fluid  collection, or other focal abnormality. The parotid glands appear symmetric  and normal in echotexture with no discrete mass.     IMPRESSION:      Normal exam         Previous Labs:  03/21/23  Surgical pathology A.  GASTRIC BIOPSY:          MINIMAL SUPERFICIAL CHRONIC INACTIVE INFLAMMATION, OTHERWISE     UNREMARKABLE; H. PYLORI NOT IDENTIFIED ON H&E STAIN        B.  COLON RANDOM BIOPS:     UNREMARKABLE COLONIC MUCOSA     03/06/23  CRP wnl  ESR 35  CMP wnl  CBC unremarkable    Previous Labs:  02/06/23  CRP wnl  ESR 24  CMP wnl  CBC WBC 10.96    Previous Labs:  11/25/22  Procalcitonin wnl  CMP wnl  CBC unremarkable      Previous Labs:  08/29/22  CBC platelets 350  BMP unremarkable  ESR wnl  CRP wnl      Previous Imaging:  12/16/22  MRI Lumbar Spine:  IMPRESSION:         1.  No acute osseous or soft tissue abnormality.  2.  Normal MR appearance of the distal spinal cord, conus medullaris and  cauda equina.  3.  Rotatory lumbar levoscoliosis.  4.  Lumbar spondylosis and facet arthrosis without central stenosis or  nerve root compression. Please see full details and segmental analysis  above.    CT Chest Angio:  11/23/22  FINDINGS:       LINES/TUBES: None.     LUNGS: No consolidation, edema or mass. There is linear  atelectasis at the  lung bases.  PLEURA: No pleural effusions or pneumothorax.     HEART: Not enlarged.  MEDIASTINUM: No axillary, hilar or mediastinal lymphadenopathy.  PULMONARY ARTERIES: No acute pulmonary emboli.  AORTA: No aneurysm or dissection.     UPPER ABDOMEN: Hepatic steatosis. No acute abnormality.     BONES AND SOFT TISSUES: Unremarkable.     IMPRESSION:      No acute pulmonary embolism.    CT A/P  11/23/22:  FINDINGS:      LINES/TUBES: None.     LOWER THORAX: Bibasilar atelectasis.     LIVER/BILIARY TREE: The gallbladder is absent. There is hepatic steatosis.  No biliary dilation.  SPLEEN: No splenomegaly.  PANCREAS: No pancreatic mass or duct dilatation.     KIDNEYS/URETERS: No hydronephrosis, stones or solid mass lesions.  ADRENALS: No adrenal mass.  PELVIC ORGANS/BLADDER: No pelvic masses. The bladder is partially  distended.     PERITONEUM/RETROPERITONEUM: No free air or fluid.  LYMPH NODES: No lymphadenopathy.  VESSELS: No aortic aneurysm.     GI TRACT: No bowel wall thickening or dilation. Normal appendix.     BONES AND SOFT TISSUES: There are degenerative changes in the spine. No  suspicious or destructive osseous lesion.     IMPRESSION:      No acute abnormality.    Previous Imaging:      09/19/22:  ECHO:  Left Ventricle    The left ventricle is normal in size. There is normal left ventricular  geometry. Left ventricular systolic function is normal with an ejection  fraction by Biplane Method of Discs of  60 %.  3D LVEF 58%. Left ventricular  segmental wall motion is normal. Left ventricular diastolic filling parameters  are consistent with Grade I diastolic dysfunction (impaired relaxation  pattern).  Right Ventricle    The right ventricular cavity size is normal in size. Normal right  ventricular systolic function.    08/27/22  MRI left Hip:  IMPRESSION:      1.Trace left hip joint effusion without significant  synovitis. Tear of the  left anterosuperior acetabular labrum. Otherwise unremarkable left hip  joint.     2.Mild to moderate left distal gluteus minimus tendinosis and mild to  moderate left greater trochanteric bursitis with bursal edema. There is  mild left proximal hamstring tendinosis.      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     4.There has been interval enlargement of a left adnexal cyst since pelvic  ultrasound 03/18/2022, now measuring 3.3 cm. This could be further  evaluated with follow-up pelvic ultrasound.     5.Unchanged 3.8 cm cyst adjacent to the vaginal cuff post hysterectomy  since multiple prior exams favoring a benign etiology.      Previous Labs:  07/25/22  ESR 37  CRP wnl  CMP ALt 39  CBC wnl      Previous Imaging:  04/12/22  Cardiac MRI:  IMPRESSION:       1. No delayed gadolinium enhancement to suggest myocardial fibrosis or  scarring.  2. Normal right ventricular systolic function.  3. Qualitatively mild global left ventricular hypokinesis. Left ventricular  systolic function measures 32% and may be artifactually reduced secondary  to significant motion artifact.    Previous Labs:  04/19/22  Throat culture no B-hemolytic strep  POCT flu A/B  Abbot -      Previous Labs:  01/14/22  CMP wnl  CRP wnl  ViT D 31  ESR  wnl  Lipid panel wnl  TSH wnl      08/18/21  CBC wnl  CMP wnl  ESR wnl  CRP wnl    Previous Labs:  05/25/21  Quant Gold-  HepbSAb wnl  HepbSAg-  HepCAb-  HIV-        Previous Labs:  02/23/21  ESR wnl  CRP wnl  CMP ALT 42, AST normal    CRP wnl  ESR wnl  HLAB51 positive  IgE, IgA, IGG, IgM all wnl  CBC wnl    12/03/20  DIAGNOSIS:          A. Lesion, ulnar wrist, excision:     -Fibroadipose tissue and vasculature          B. Synovial tissue, biopsy:     -Fibroadipose tissue and synovium (see comment)          Comment:     Morphologic features typical of rheumatoid arthritis or infection are     not identified.    12/03/20  Bacterial Cultures: No Growth  Fungal  Cultures: NG at one week  AFB: No growth    11/20/20  ESR 50  CRP wnl  IgG subclasses wnl  ANA comprehensive panel negative        Previous Labs:    [facility] Labs:  10/23/20  C-ANCA, P-ANCA, MPO, PR3 all negative  Mitochondrial Ab-  ANA IFA-  Scl70-  Centromere-  RNP-  SMith-  TPPA Ab-      05/18/20  Ferritin 291  CMP AST/A:T 59/70  CBC elevated lymphocytes    02/06/20  MPO-  PR3-  SSA-  aCL IgM 14  aCL IgG, IgA -  Lupus anticoagulant -  B2IgG, IgM -  ACE wnl  CCP-  RF-  Vit D 31  TSH wnl  ESR wnl  CK wnl  C4 wnl  C3 wnl  CRP wnl    07/2019  ANA Screen-  LDH wnl    07/23/2018 lymph node biopsy:DIAGNOSIS:          Lymph node, deep right neck, excisional biopsy:     -Lymph node with non-specific reactive changes   Wound, AFB and Fungal cultures negative at this time    BONE MARROW 05/03/2019      BONE MARROW, LEFT POSTERIOR ILIAC CREST, CORE BIOPSY, CLOT SECTION,    ASPIRATE SMEAR AND TOUCH PREP:        - NORMOCELLULAR BONE MARROW (approximate50-60%) WITH TRILINEAGE HEMATOPOIESIS,  NORMAL MYELOID TO ERYTHROID RATIO AND ADEQUATE NUMBERS OF    MEGAKARYOCYTES WIT H UNREMARKABLE MORPHOLOGY        - A FEW SMALL REACTIVE LYMPHOID AGGREGATES IDENTIFIED, AND MILDLY  INCREASED NUMBERS OF EOSINOPHILS        - PENDING CYTOGENETIC STUDIES OF BONE MARROW SAMPLE  - Remain pending as of 05/14/2019.        COMMENT:    The concurrent flow cytometry performed on the bone marrow aspirate  sample shows no evidence of immunophenotypic abnormalities      Imaging Reviewed:    08/16/20  MRI Brain:IMPRESSION:      1. No intracranial mass, hemorrhage, or hydrocephalus is detected.  2. There is normal pattern of enhancement of the brain parenchyma and  meningeal surfaces.    9/18 MRI Abdomen:  IMPRESSION:      1. Diffuse hepatic steatosis. No suspicious focal hepatic lesions.  2. No biliary ductal dilatation or choledocholithiasis.    02/03/20  MRI Pelvis:  IMPRESSION:  Cystic structure at the vaginal cuff, flank by both ovaries not  significantly  changed in size compared to 2019. No abnormal internal  enhancement. This favors a benign process such as a peritoneal inclusion  cyst or a paraovarian cyst.    12/14/19 MRI R Hand:  IMPRESSION:      1.  No suspicious soft tissue mass, osseous mass, or masslike  enhancement in the right hand or wrist to correlate with the diffuse FDG  uptake on recent PET/CT.  2.  There is mild enhancing synovitis in the right wrist joint without  active joint erosion or bony destruction.  3.  A small dorsal ganglion cyst overlying the scapholunate interval.    12/06/19  Full Body PET Scan:    Hypermetabolic soft tissue mass in palmar R hand surface at base of the second digit-stable pulmonary nodulke 1.3 cm left lower lobe-recommend follow up Chest CT 3 months         The following sections were reviewed this encounter by the provider:   Tobacco  Allergies  Meds  Problems  Med Hx  Surg Hx  Fam Hx         PMH/PSH:  Past Medical History:   Diagnosis Date    Abdominal hernia 2016    Resolved by surgical repair    Abnormal vision March 2024    reading glasses    Amenorrhea 1999    Resolved by hysterectomy    Anxiety 11/07/18    Arrhythmia     WPW ablation in 2005, 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 Released from cardiology    Atrial flutter (CMS/HCC) 20+ years    Previous history of WPW    Behcet's disease (CMS/HCC)     She sees Dr. 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    02/11/23 stable on medical    Diarrhea     Dizziness 15+ years    Allergies, migraine    Ear, nose and throat disorder     seasonal allergies    Fatigue 2019    Bechet's    Fever 11/09/2022    Recurrent fever due to Behcets Disease    Fracture of hand 2016    Right hand middle finger    Hard to intubate 2019    Headache 2002    migraine    History of adverse reaction to anesthesia 2005    Nausea    History of ulcer disease 2019     Bechets Disease    Hx of ovarian cyst     Lower back pain     not severe    Meningitis 2011    Viral // treated and resolved    Migraine 2002    Near syncope 1995    Previous history of WPW    Other physical therapy March 2023    Neck    Pain in joint 2019    RA part of Bechets Disease    Pain in wrist 11/09/2022    Palpitations 20+ years    Previous history of WPW    Post-operative nausea and vomiting 2005    Rash 2019    Behcets disease - controlled by medication    Rheumatoid arthritis (CMS/HCC) 2019    Part of Behcets Disease    Scoliosis 1985    moderate//  used a Brace at a young age    Shingles 2015    Swelling of extremity 2019    Left ankle    Uveitis     using eyedrops        Past Surgical History:   Procedure Laterality Date    ABDOMINAL SURGERY  2016; 2012; 2024    Umbilical hernia; hysterectomy    ABLATION - PVC'S N/A 06/08/2022    Procedure: Ablation - PVC's;  Surgeon: Von Oh, MD;  Location: [facility];  Service: Cardiovascular;  Laterality: N/A;  SAME DAY DISCHARGE  CARTO NO TEE    ARTHROTOMY, WRIST Right 12/03/2020    Procedure: RIGHT UPPER EXTREMITY SYNOVIAL BIOPSY;  Surgeon: Bolling Oh, MD;  Location: [facility];  Service: Plastics;  Laterality: Right;    BIOPSY, LYMPH NODE N/A 07/23/2018    Procedure: BIOPSY, LYMPH NODE;  Surgeon: Gloris Wilkie FALCON, MD;  Location: [facility];  Service: ENT;  Laterality: N/A;  NECK DEEP LYMPH NODE BIOPSY    BONE MARROW BIOPSY  2020    led to behcet's dx    CARDIAC ABLATION  2005    [facility]  at [facility] for Wolff-Parkinson-White    CARDIAC CATHETERIZATION  2005    CHOLECYSTECTOMY  2009    COLONOSCOPY, DIAGNOSTIC (SCREENING) N/A 03/21/2023    Procedure: COLONOSCOPY, DIAGNOSTIC (SCREENING);  Surgeon: Leontine Countryman, MD;  Location: [facility];  Service: Gastroenterology;  Laterality: N/A;    DILATION AND CURETTAGE OF UTERUS  2010    EGD N/A 03/21/2023    Procedure: EGD;  Surgeon: Leontine Countryman, MD;  Location: [facility];   Service: Gastroenterology;  Laterality: N/A;    EXCISION, SOFT TISSUE Left 09/12/2024    Procedure: EXCISION OF LEFT ANTEROLATERAL ANKLE MASS;  Surgeon: Myrle Lenis, MD;  Location: [facility];  Service: Orthopedics;  Laterality: Left;    FINGER GANGLION CYST EXCISION  04/2018    FOOT SURGERY  2014    Ganglion cyst removal from Achilles    GANGLION CYST EXCISION  2014    04/2018 cyst removed from index finger-[facility]    HAND SURGERY  January 2022    HERNIA REPAIR  2016    HYSTERECTOMY  2012    HYSTEROSCOPY  2011    indication: Abnormal bleeding    LAPAROSCOPIC, LYSIS, ADHESIONS N/A 11/09/2022    Procedure: LAPAROSCOPIC LYSIS OF ADHESIONS;  Surgeon: Jan, Ambareen G, MD;  Location: [facility];  Service: Gynecology;  Laterality: N/A;    LAPAROSCOPIC, SALPINGECTOMY Left 11/09/2022    Procedure: LAPAROSCOPIC, SALPINGECTOMY LEFT;  Surgeon: Madison Sedalia MATSU, MD;  Location: [facility];  Service: Gynecology;  Laterality: Left;    LAPAROSCOPY, DIAGNOSTIC N/A 11/09/2022    Procedure: LAPAROSCOPY, DIAGNOSTIC;  Surgeon: Madison Sedalia MATSU, MD;  Location: [facility];  Service: Gynecology;  Laterality: N/A;  [id number]    UMBILICAL HERNIA REPAIR  2016    UPPER GASTROINTESTINAL ENDOSCOPY  2009    UPPER GASTROINTESTINAL ENDOSCOPY  2009    WISDOM TOOTH EXTRACTION  1998        FH/SH:  Family History   Adopted: Yes       Social History     Tobacco Use    Smoking status: Never     Passive exposure: Never    Smokeless tobacco: Never   Vaping Use    Vaping status: Never Used   Substance Use Topics    Alcohol use: Never    Drug use: Never  Meds/ Allergies:  No outpatient medications have been marked as taking for the 11/18/24 encounter (Telemedicine Visit) with Woodard Geni HERO, MD.     Current Facility-Administered Medications for the 11/18/24 encounter (Telemedicine Visit) with Woodard Geni HERO, MD   Medication Dose Route Frequency Provider Last Rate Last Admin    onabotulinumtoxin A (BOTOX ) injection 175 Units  175  Units Intramuscular Q12 Weeks Kastl, Charlotte M, MD   175 Units at 09/16/24 1150     Allergies   Allergen Reactions    Fruit Blend Flavor [Flavoring Agent (Non-Screening)] Anaphylaxis and Swelling     Tropical Fruit     Pineapple Anaphylaxis    Latex Drug-Induced Flushing, Hives, Other (See Comments), Swelling, Itching and Rash    Cyanoacrylate Itching and Rash    Other Itching and Rash     Sutures Monocryl    Oxycodone Hallucinations, Hives and Other (See Comments)     Hallucinations, took 1 time 25 years ago    Wound Dressing Adhesive Itching and Other (See Comments)     adhesive          PHYSICAL EXAM  General- WNWD, alert and oriented, NAD  Eyes- EOMI, PERRL, no conjunctival injection, no scleral icterus  ENMT- face symmetric without lesions  Skin- no rash, no alopecia  Pulm: No conversational dyspnea, breathing comfortable on room air  Neuro - no notable neurological deficits, no facial asymmetry            LABS:  Lab Results   Component Value Date    WBC 11.81 (H) 09/25/2024    HGB 13.5 09/25/2024    HCT 40.4 09/25/2024    MCV 99.5 (H) 09/25/2024    PLT 297 09/25/2024      Lab Results   Component Value Date    CREAT 0.7 09/25/2024    BUN 12 09/25/2024    NA 140 09/25/2024    K 4.3 09/25/2024    CL 105 09/25/2024    CO2 24 09/25/2024      Lab Results   Component Value Date    ALT 43 09/25/2024    AST 25 09/25/2024    ALKPHOS 41 09/25/2024    BILITOTAL 0.5 09/25/2024     Lab Results   Component Value Date    ESR 5 09/25/2024    ESR 3 07/31/2024    ESR 8 06/05/2024    ESR 8 04/02/2024    ESR 3 02/06/2024      Lab Results   Component Value Date    CRP 0.2 09/25/2024    CRP 0.1 07/31/2024    CRP <0.1 06/05/2024    CRP <0.1 04/02/2024    CRP <0.1 02/06/2024      No results found for: ANA   Lab Results   Component Value Date    RHEUMFACTOR <15.0 02/06/2020      Lab Results   Component Value Date    CCP <15.6 02/06/2020     No results found for: URICACID        ASSESSMENT/PLAN:    This patient is a 44 y.o. 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 Recent joint pain and stiffness exacerbation pre-Remicade . Considered increasing Remicade  frequency due to symptom exacerbation. Discussed off-label Remicade  use every six weeks, referencing a Japanese trial. She prefers avoiding prednisone  and is open to adjusting Remicade .  - Increase Remicade  infusion frequency to every six weeks.  There are documented observational trials with Behcets patient's beging given remicade  dosing every 6-8 weeks with good response.  She is aware of increased risk for opportunistic infections  - Consult with Dr. [name] regarding Remicade  dosing interval.  Messaged him to discuss  - Continue current methotrexate  regimen.  - Documented joint pain and stiffness exacerbation pre-Remicade  infusion.    High Risk Med Use  Frequent lab monitoring required in the setting of MTX use, at least every 3 months to monitor for cytopenias, renal insufficiency, liver toxicity.  Labs ordered    Uveitis  -On remicade , will increase interval, following with ophthlmology    Seronegative rheumatoid arthritis  Managed with methotrexate  and Remicade . Recent joint pain and stiffness exacerbation pre-Remicade . Considered increasing methotrexate  dose but not preferred due to long-term risks. She prefers avoiding prednisone  and is open to adjusting Remicade .  - Continue current methotrexate  regimen.  - Increase Remicade  infusion frequency to every six weeks.    Postoperative care  following left ankle lipoma excision  Recovery progressing well with improved mobility and reduced pain and swelling. Incision site tender but healing. Pathology confirmed lipoma with no malignancy.  - Follow up with surgeon if any issues arise.    Vaccine Counseling  -No clear CDC guidance that she falls in an at risk category for bacterial meningitis vaccine.  She does not have a documented complement defiency nor on a complement inhibitiring medications and is not planning to enture high risk situation-military, college residence or travel to an endemic area      Total of 41 minutes spent reviewing patient records, interviewing patient and coordinating plan of care.        1. Behcet's disease (CMS/HCC)  - Apremilast  (Otezla ) 30 MG Tablet; Take 1 tablet (30 mg) by mouth 2 (two) times daily  Dispense: 180 tablet; Refill: 1    2. Aphthous ulcer  - Apremilast  (Otezla ) 30 MG Tablet; Take 1 tablet (30 mg) by mouth 2 (two) times daily  Dispense: 180 tablet; Refill: 1    3. Seronegative rheumatoid arthritis (CMS/HCC)  - Apremilast  (Otezla ) 30 MG Tablet; Take 1 tablet (30 mg) by mouth 2 (two) times daily  Dispense: 180 tablet; Refill: 1    4. Behcet recurrent disease (CMS/HCC)    5. Seizure (CMS/HCC)    6. 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 High risk medication use    9. Vaccine counseling                                                 FOLLOW UP:  Return in about 3 months (around 02/16/2025) for video OK, 40 minutes.  Geni Derby, MD  Rheumatologist  [facility]  [address]  [place], [place] [address]  Phone: [phone]  Fax: [phone]

## 2024-11-18 NOTE — Progress Notes (Signed)
 Received refill eRx via [facility] and ran test claim via [facility]. PA active on file. RTS until 01/23/2025. Insurance lockout as well. Patient currently fills at [facility]      Benefits Investigation Summary  Medication: Apremilast  (Otezla )     Prescription: Renewal  Benefits Outcome: Refill Too Soon (until 01/23/2025. Insurance lockout as well)     Estimated Rx Copay:  No data recorded   Additional Notes:PA active until 11/16/2025    Next Step: Prescription has been released to [facility]     :

## 2024-11-19 ENCOUNTER — Ambulatory Visit: Payer: BLUE CROSS/BLUE SHIELD | Attending: Neurology

## 2024-11-19 DIAGNOSIS — R413 Other amnesia: Secondary | ICD-10-CM | POA: Insufficient documentation

## 2024-11-19 DIAGNOSIS — M352 Behcet's disease: Secondary | ICD-10-CM | POA: Insufficient documentation

## 2024-11-19 DIAGNOSIS — R41840 Attention and concentration deficit: Secondary | ICD-10-CM | POA: Insufficient documentation

## 2024-11-19 DIAGNOSIS — R41844 Frontal lobe and executive function deficit: Secondary | ICD-10-CM | POA: Insufficient documentation

## 2024-11-19 DIAGNOSIS — R41841 Cognitive communication deficit: Secondary | ICD-10-CM | POA: Insufficient documentation

## 2024-11-19 NOTE — SLP Eval Note (Signed)
 [facility]  [address] [place] [address]  Phone: [phone]      Fax: [phone]    SPEECH THERAPY EVALUATION AND PLAN OF CARE    *I agree to the below stated plan of care*                  Physician Signature      Date    REFERRING MD:  [name] Sammi DEL, MD PhD    PATIENT: Hailey [name] DOB: Apr 01, 1981   MR #: [id number]  AGE: 44 y.o.    FACILITY PROVIDER #: [id number] PRIMARY MD: [name], MD    CERTIFICATION DATES:     SLP Received On: 11/19/24 to 02/15/2025 START OF CARE:  SLP Received On: 11/19/24   DIAGNOSES:  Behcet's disease (CMS/HCC) [M35.2];Memory loss [R41.3]  TREATMENT DIAGNOSIS: {SLP TX DIAGNOSIS:32202}     Date of Service SLP Received On: 11/19/24   Treatment Time Start Time: 1400 to Stop Time: 1500   Time Calculation Time Calculation (min): 60 min   Visit # SLP Visit Number: 1   Units Billed SLP Evaluations  $ Eval Speech Sound Prod w/Compreh/Express [id number]): 1 Procedure       Order received for Speech-Language Evaluation and Treatment.    Number of visits authorized  ***    Precautions:   Seizure like episodes; Occurring approximately every 2 weeks      Allergies[1]    Medications:  has a current medication list which includes the following prescription(s): albuterol -budesonide , otezla , azelastine -fluticasone , botulinum toxin type a , buspirone , clindamycin , colesevelam , cyanocobalamin , diazepam , dicyclomine , divalproex  (er) extended release, eletriptan , epinephrine , folic acid , ajovy , hydroxyzine , infliximab , lacosamide , lidocaine -prilocaine, melatonin-pyridoxine, methocarbamol , methotrexate , modafinil , multiple vitamin, naltrex, omeprazole , ondansetron , prednisone , rimegepant sulfate , valacyclovir , and vitamin d , and the following Facility-Administered Medications: onabotulinumtoxin a.    Past Medical/Surgical History:  Medical History[2]  Past Surgical History[3]     Social History:   Pt lives with spouse. Pt  reports medical retirement. Pt reports enjoying crafting, as much as my joints will let me.     History of Present Illness: Hailey [name] is a 44 y.o. female who presents to outpatient SLP evaluation ***. Pt was seen at this facility ***. Pt pneumonia, pt is seeing a pulmonologist. Infusions every 6 weeks.  Word finding present during evaluation ***. 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 ***Attention zoning out ***, thinking and responding thinking of words and phrases and concepts***      ***SLP evaluation with a diagnosis of Behcet's disease that has resulted in word finding, memory, reading comprehension, attention, planning, and vision deficits. Pt was diagnosed with Behcet's disease in 2019 and was well managing on Humira  and other medications until the beginning of 2023 when she felt she had a status change. At this time, pt reports everything went haywire and deficits noted above began. She reports a history of possible seizures that occur every 2-3 weeks. Seizures have not been seen during EEG. Pt also reports increased fatigue which impacts daily functioning. She reports a lumbar puncture was performed which confirmed the Behcet's disease was affecting brain functioning. 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     Prior Level of Function:    {OSR LEVEL OF FUNCTION:21092000}    Current Level of Function:  Pt reports memory, word finding aren't better but skills are getting better. Pt reports still feeling like  taking a long time to find words. Significantly better in public, possibly spouse assistance with word retrieval making it feel easier. Recognizing fatigue. Feels like word finding is a strong indicator of upcoming seizure. Pt reports continued difficulty with reading longer passages, with average readying length a paragraph or two. Reading more than once to assist with comprehension.  Reading certain words, I know the word, but I can't read it. Reading forms. Some time pressure. Pt reports improved recall of short term memory tasks such as recipes; utilizing calendar on phone, notes on phone, alarm, post it notes. Distractions are still hard- background noise with conversation is difficult.    Patient Goal:    Pt reports I want to relearn strategies to be as good at covering the word finding as I can be, because that gives me the most stress. I want to be able to think faster.    SUBJECTIVE REPORT:  Patient arrives alone.  ***    MENTAL HEALTH SCREEN:  PHQ-9 is a self-report tool to screen, diagnose, and monitor depression severity. It consists of nine questions with scores ranging from 0 to 3, and a total score of 0 to 27.    Results:     Action taken:  {PHQ9 actions:69517}      Interpretation of Total Score   1-4 Minimal depression    5-9 Mild depression    10-14 Moderate depression    15-19 Moderately severe depression    20-27 Severe depression      PHQ9 Copyright  [employer]. All rights reserved. Reproduced with permission. PRIME-MD  is a Designer, Multimedia      Pain:    6 on a scale of 0 - 10, location: joint paint    OBJECTIVE MEASUREMENTS AND CLINICAL OBSERVATIONS:  Observations:    ***    The following tests were performed today:  {SLP SPECIAL TESTS:70534}  Results listed below.    Receptive Language:   1-step directions: {SLP impairment:40997}  2-step directions: {SLP impairment:40997}  3-step directions: {SLP impairment:40997}  Auditory comprehension: {SLP impairment:40997}  Body part identification-right/left: {SLP impairment:40997}  Object identification: {SLP impairment:40997}  Photo identification: {SLP impairment:40997}  Line drawing recognition:    Simple: {SLP impairment:40997}   Complex: {SLP impairment:40997}    Expressive Language:    Automatic sequences: {SLP impairment:40997}  Phrase completion: {SLP impairment:40997}  Sentence completion: {SLP  impairment:40997}  Responsive naming: {SLP impairment:40997}  Confrontational naming: {SLP impairment:40997}  Repetition:    Words: {SLP impairment:40997}   Phrases: {SLP impairment:40997}   Sentences: {SLP impairment:40997}  Object naming: {SLP impairment:40997}  Verbal fluency: {SLP impairment:40997}  Conversational/spontaneous speech: {SLP impairment:40997}    Orientation:  Time: {SLP impairment:40997}  Person: {SLP impairment:40997}  Place: {SLP impairment:40997}  Biographical information: {SLP impairment:40997}      Cognition:  ***        Velum: {Velar impairment:40998}  Gag: {SLP present/absent:41001}  Dentition: {dentition:32693}        Word Fluency Test:  MDH*** 11,12, 7    Dysarthria:  WFL - Pt is 100% intelligible to this unfamiliar ***      ASSESSMENT:: Hailey [name] is a 44 y.o. female presents to outpatient therapy today ***    The following impairments were noted:  {SLP ASSESSMENT JILOU:66262}    Patient presents with the following functional limitations:  {OSR ADULT SLP FUNCTIONAL LIMITATIONS:34343}    Without intervention, patient is at risk for:  {OSR ADULT SLP DISABILITY:34337}    It is medically necessary for [employer]  Villella to recieve skilled speech therapy intervention in order to address the above impairments and functional limitations.      Patient Education:  Patient educated on ***. Pt verbalized an understanding of this education, as well as goals and benefits of skilled speech therapy.     Rehabilitation Potential:    {SLP Rehab potential:40993}     Treatment Today:  {TX TODAY ENR:59004}    PLAN:    Patient/Caregiver Education and Training  07492 Speech / Language Treatment    Frequency of treatment:    1 times per week for 12 weeks.    Short Term Goals:  To be met by 6 weeks from 11/19/2024  Word Finding  Memory  Strategies  Auditory Processing    Long Term Goals:  To be met by 12 weeks from 11/19/2024      It has been a pleasure to evaluate Hailey [name].   Please contact me with any questions or concerns regarding this patients evaluation or ongoing therapy.    Note:  Please pardon any potential grammatical errors or typos as aspects of this note may have been created through speech-to-text software.      Therapist Signature:    Bernarda Hench M.A., CCC-SLP  Speech Language Pathologist  [facility]  Outpatient Specialty Rehabilitation  [email or web address] [email or web address]    SLP Received On: 11/19/24               [1]   Allergies  Allergen Reactions    Fruit Blend Flavor [Flavoring Agent (Non-Screening)] Anaphylaxis and Swelling     Tropical Fruit     Pineapple Anaphylaxis    Latex Drug-Induced Flushing, Hives, Other (See Comments), Swelling, Itching and Rash    Cyanoacrylate Itching and Rash    Other Itching and Rash     Sutures Monocryl    Oxycodone Hallucinations, Hives and Other (See Comments)     Hallucinations, took 1 time 25 years ago    Wound Dressing Adhesive Itching and Other (See Comments)     adhesive   [2]   Past Medical History:  Diagnosis Date    Abdominal hernia 2016    Resolved by surgical repair    Abnormal vision March 2024    reading glasses    Amenorrhea 1999    Resolved by hysterectomy    Anxiety 11/07/18    Arrhythmia     WPW ablation in 2005, 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 Released from cardiology    Atrial flutter (CMS/HCC) 20+ years    Previous history of WPW    Behcet's disease (CMS/HCC)     She sees Dr. 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    02/11/23 stable on medical    Diarrhea     Dizziness 15+ years    Allergies, migraine    Ear, nose and throat disorder     seasonal allergies    Fatigue 2019    Bechet's    Fever 11/09/2022    Recurrent fever due to Behcets Disease    Fracture of hand 2016    Right hand middle finger    Hard to intubate 2019    Headache 2002    migraine    History of adverse reaction to  anesthesia 2005    Nausea    History of ulcer disease  2019    Bechets Disease    Hx of ovarian cyst     Lower back pain     not severe    Meningitis 2011    Viral // treated and resolved    Migraine 2002    Near syncope 1995    Previous history of WPW    Other physical therapy March 2023    Neck    Pain in joint 2019    RA part of Bechets Disease    Pain in wrist 11/09/2022    Palpitations 20+ years    Previous history of WPW    Post-operative nausea and vomiting 2005    Rash 2019    Behcets disease - controlled by medication    Rheumatoid arthritis (CMS/HCC) 2019    Part of Behcets Disease    Scoliosis 1985    moderate// used a Brace at a young age    Shingles 2015    Swelling of extremity 2019    Left ankle    Uveitis     using eyedrops   [3]   Past Surgical History:  Procedure Laterality Date    ABDOMINAL SURGERY  2016; 2012; 2024    Umbilical hernia; hysterectomy    ABLATION - PVC'S N/A 06/08/2022    Procedure: Ablation - PVC's;  Surgeon: Von Oh, MD;  Location: [facility];  Service: Cardiovascular;  Laterality: N/A;  SAME DAY DISCHARGE  CARTO NO TEE    ARTHROTOMY, WRIST Right 12/03/2020    Procedure: RIGHT UPPER EXTREMITY SYNOVIAL BIOPSY;  Surgeon: Bolling Oh, MD;  Location: [facility];  Service: Plastics;  Laterality: Right;    BIOPSY, LYMPH NODE N/A 07/23/2018    Procedure: BIOPSY, LYMPH NODE;  Surgeon: Gloris Wilkie FALCON, MD;  [facility];  Service: ENT;  Laterality: N/A;  NECK DEEP LYMPH NODE BIOPSY    BONE MARROW BIOPSY  2020    led to behcet's dx    CARDIAC ABLATION  2005    [facility]  at [facility] for [facility]    CARDIAC CATHETERIZATION  2005    CHOLECYSTECTOMY  2009    COLONOSCOPY, DIAGNOSTIC (SCREENING) N/A 03/21/2023    Procedure: COLONOSCOPY, DIAGNOSTIC (SCREENING);  Surgeon: Leontine Countryman, MD;  Location: [facility];  Service: Gastroenterology;  Laterality: N/A;    DILATION AND CURETTAGE OF UTERUS  2010    EGD N/A 03/21/2023    Procedure: EGD;   Surgeon: Leontine Countryman, MD;  Location: [facility];  Service: Gastroenterology;  Laterality: N/A;    EXCISION, SOFT TISSUE Left 09/12/2024    Procedure: EXCISION OF LEFT ANTEROLATERAL ANKLE MASS;  Surgeon: Myrle Lenis, MD;  Location: [facility];  Service: Orthopedics;  Laterality: Left;    FINGER GANGLION CYST EXCISION  04/2018    FOOT SURGERY  2014    Ganglion cyst removal from Achilles    GANGLION CYST EXCISION  2014    04/2018 cyst [facility] index finger-[facility]    HAND SURGERY  January 2022    HERNIA REPAIR  2016    HYSTERECTOMY  2012    HYSTEROSCOPY  2011    indication: Abnormal bleeding    LAPAROSCOPIC, LYSIS, ADHESIONS N/A 11/09/2022    Procedure: LAPAROSCOPIC LYSIS OF ADHESIONS;  Surgeon: Jan, Ambareen G, MD;  Location: [facility];  Service: Gynecology;  Laterality: N/A;    LAPAROSCOPIC, SALPINGECTOMY Left 11/09/2022    Procedure: LAPAROSCOPIC, SALPINGECTOMY LEFT;  Surgeon: Madison Sedalia MATSU, MD;  Location: [facility];  Service: Gynecology;  Laterality: Left;    LAPAROSCOPY, DIAGNOSTIC N/A 11/09/2022    Procedure: LAPAROSCOPY, DIAGNOSTIC;  Surgeon: Madison Sedalia MATSU, MD;  Location: [facility];  Service: Gynecology;  Laterality: N/A;  [id number]    UMBILICAL HERNIA REPAIR  2016    UPPER GASTROINTESTINAL ENDOSCOPY  2009    UPPER GASTROINTESTINAL ENDOSCOPY  2009    WISDOM TOOTH EXTRACTION  1998

## 2024-11-20 ENCOUNTER — Other Ambulatory Visit (INDEPENDENT_AMBULATORY_CARE_PROVIDER_SITE_OTHER): Payer: BLUE CROSS/BLUE SHIELD

## 2024-11-20 ENCOUNTER — Ambulatory Visit: Payer: BLUE CROSS/BLUE SHIELD | Attending: Internal Medicine

## 2024-11-20 VITALS — BP 134/83 | HR 88 | Temp 98.4°F | Wt 323.1 lb

## 2024-11-20 DIAGNOSIS — M0600 Rheumatoid arthritis without rheumatoid factor, unspecified site: Secondary | ICD-10-CM | POA: Insufficient documentation

## 2024-11-20 DIAGNOSIS — R569 Unspecified convulsions: Secondary | ICD-10-CM | POA: Insufficient documentation

## 2024-11-20 DIAGNOSIS — M352 Behcet's disease: Secondary | ICD-10-CM | POA: Insufficient documentation

## 2024-11-20 DIAGNOSIS — H209 Unspecified iridocyclitis: Secondary | ICD-10-CM

## 2024-11-20 DIAGNOSIS — D84821 Immunodeficiency due to drugs: Secondary | ICD-10-CM

## 2024-11-20 LAB — LAB USE ONLY - CBC WITH DIFFERENTIAL
Absolute Basophils: 0.03 x10 3/uL (ref 0.00–0.08)
Absolute Eosinophils: 0.13 x10 3/uL (ref 0.00–0.44)
Absolute Immature Granulocytes: 0.07 x10 3/uL (ref 0.00–0.07)
Absolute Lymphocytes: 5.3 x10 3/uL — ABNORMAL HIGH (ref 0.42–3.22)
Absolute Monocytes: 0.71 x10 3/uL (ref 0.21–0.85)
Absolute Neutrophils: 5.3 x10 3/uL (ref 1.10–6.33)
Absolute nRBC: 0 x10 3/uL (ref <=0.00)
Basophils %: 0.3 %
Eosinophils %: 1.1 %
Hematocrit: 39.9 % (ref 34.7–43.7)
Hemoglobin: 13.5 g/dL (ref 11.4–14.8)
Immature Granulocytes %: 0.6 %
Lymphocytes %: 45.9 %
MCH: 32.8 pg (ref 25.1–33.5)
MCHC: 33.8 g/dL (ref 31.5–35.8)
MCV: 96.8 fL — ABNORMAL HIGH (ref 78.0–96.0)
MPV: 8 fL — ABNORMAL LOW (ref 8.9–12.5)
Monocytes %: 6.2 %
Neutrophils %: 45.9 %
Platelet Count: 285 x10 3/uL (ref 142–346)
Preliminary Absolute Neutrophil Count: 5.3 x10 3/uL (ref 1.10–6.33)
RBC: 4.12 x10 6/uL (ref 3.90–5.10)
RDW: 13 % (ref 11–15)
WBC: 11.54 x10 3/uL — ABNORMAL HIGH (ref 3.10–9.50)
nRBC %: 0 /100{WBCs} (ref <=0.0)

## 2024-11-20 LAB — COMPREHENSIVE METABOLIC PANEL
ALT: 45 U/L (ref <=55)
AST (SGOT): 28 U/L (ref <=41)
Albumin/Globulin Ratio: 1.2 (ref 0.9–2.2)
Albumin: 3.9 g/dL (ref 3.5–4.9)
Alkaline Phosphatase: 44 U/L (ref 37–117)
Anion Gap: 9 (ref 5.0–15.0)
BUN: 11 mg/dL (ref 7–21)
Bilirubin, Total: 0.3 mg/dL (ref 0.2–1.2)
CO2: 23 meq/L (ref 17–29)
Calcium: 9.6 mg/dL (ref 8.5–10.5)
Chloride: 107 meq/L (ref 99–111)
Creatinine: 0.6 mg/dL (ref 0.4–1.0)
GFR: >60 mL/min/1.73 m2 (ref >=60.0)
Globulin: 3.3 g/dL (ref 2.0–3.6)
Glucose: 90 mg/dL (ref 70–100)
Hemolysis Index: 9 {index}
Potassium: 4 meq/L (ref 3.5–5.3)
Protein, Total: 7.2 g/dL (ref 6.0–8.3)
Sodium: 139 meq/L (ref 135–145)

## 2024-11-20 LAB — C-REACTIVE PROTEIN: C-Reactive Protein: 0.1 mg/dL (ref 0.0–1.1)

## 2024-11-20 LAB — SEDIMENTATION RATE: Sed Rate: 7 mm/h (ref <=20)

## 2024-11-20 MED ORDER — DIPHENHYDRAMINE HCL 50 MG/ML IJ SOLN
25.0000 mg | Freq: Once | INTRAMUSCULAR | Status: AC
  Administered 2024-11-20: 25 mg via INTRAVENOUS
  Filled 2024-11-20: qty 1

## 2024-11-20 MED ORDER — SODIUM CHLORIDE 0.9 % IV SOLN
5.0000 mg/kg | Freq: Once | INTRAVENOUS | Status: AC
  Administered 2024-11-20: 733 mg via INTRAVENOUS
  Filled 2024-11-20: qty 733

## 2024-11-20 MED ORDER — SODIUM CHLORIDE 0.9 % IV SOLN
250.0000 mL | INTRAVENOUS | Status: DC
  Administered 2024-11-20: 250 mL via INTRAVENOUS
  Filled 2024-11-20: qty 250

## 2024-11-20 MED ORDER — ACETAMINOPHEN 325 MG PO TABS
650.0000 mg | ORAL_TABLET | Freq: Once | ORAL | Status: AC
  Administered 2024-11-20: 650 mg via ORAL
  Filled 2024-11-20: qty 2

## 2024-11-20 MED ORDER — METHYLPREDNISOLONE SODIUM SUCC 40 MG IJ SOLR (WRAP)
40.0000 mg | Freq: Once | INTRAMUSCULAR | Status: AC
  Administered 2024-11-20: 40 mg via INTRAVENOUS
  Filled 2024-11-20: qty 1

## 2024-11-20 NOTE — Progress Notes (Signed)
 Hailey [name] is a 44 y.o. female here for Infliximab  infusion. Pt was seen by Dr. Woodard on 1/12, and will be receiving Infliximab  every 6 weeks now. Pt reports fatigue and 7/10 joint pain; she is hoping the pain will improve after her infusion. She had bronchitis last month, and continues to have residual productive cough but Dr. Woodard is aware & ok w/pt receiving tx today, and pt has a f/u with pulmonology at the end of this month. She has no other concerns.    22G left forearm PIV placed by clin tech Bri, +BBR prior to beginning infusion.     Pre-meds:  Tylenol , Benadryl , & Solu-medrol     Infliximab -axxq (Avsola ) 733mg  infused over 2 hours at a rate of 125 ml/hr through filtered IV line without incident.    PIV +BBR prior to removal, flushed and removed without issue, catheter intact. Occlusive dressing applied to site.    Administrations This Visit       0.9% NaCl infusion       Admin Date  11/20/2024 Action  New Bag Dose  250 mL Rate  20 mL/hr Route  Intravenous Documented By  Kaylise Blakeley Chelsey, RN              acetaminophen  (TYLENOL ) tablet 650 mg       Admin Date  11/20/2024 Action  Given Dose  650 mg Route  Oral Documented By  Rafferty Postlewait Chelsey, RN              diphenhydrAMINE  (BENADRYL ) injection 25 mg       Admin Date  11/20/2024 Action  Given Dose  25 mg Route  Intravenous Documented By  Dominic Mahaney Chelsey, RN              inFLIXimab -axxq (AVSOLA ) 733 mg in sodium chloride  0.9 % 250 mL infusion       Admin Date  11/20/2024 Action  New Bag Dose  733 mg Rate  125 mL/hr Route  Intravenous Documented By  Ramirez Fullbright Chelsey, RN              methylPREDNISolone  sodium succinate (Solu-MEDROL ) injection 40 mg       Admin Date  11/20/2024 Action  Given Dose  40 mg Route  Intravenous Documented By  Darina Geni Pons, RN                    Vitals:    11/20/24 0804   BP: (!) 148/92   Pulse: 90   Temp: 98.4 F (36.9 C)   TempSrc: Oral   SpO2: 95%   Weight: (!) 146.6 kg (323 lb 1.6 oz)        See MAR, VS, Doc Flowsheets for more details.  Pt tolerated the infusion well without problems.    Pt discharged stable, NAD.    RTC 01/03/25 for infliximab  (date requested by pt). Appts requested for 2/27 & 4/10.    Misty Foutz Chelsey Shonteria Abeln, RN

## 2024-11-21 ENCOUNTER — Other Ambulatory Visit: Payer: Self-pay

## 2024-11-21 ENCOUNTER — Encounter: Payer: Self-pay | Admitting: Internal Medicine

## 2024-11-22 ENCOUNTER — Other Ambulatory Visit: Payer: Self-pay

## 2024-11-22 LAB — VALPROIC ACID LEVEL, TOTAL AND FREE
Valproic Acid Free: 8 mg/L (ref 4.8–17.3)
Valproic Acid Level: 65.7 mg/L (ref 50.0–100.0)

## 2024-11-23 ENCOUNTER — Other Ambulatory Visit: Payer: Self-pay

## 2024-11-23 NOTE — Progress Notes (Signed)
 Specialty Pharmacy Refill Note    Hailey [name] is a 44 y.o. female, who is being followed by [facility] team for management of: RxSp Migraine (Enrolled) for the following services:  Refill Management  Benefits and PA Management  Refill Management  Clinical Management    Medications monitored by Specialty Pharmacy:   Fremanezumab -vfrm (Ajovy ) 225 MG/1.5ML Solution Auto-injector   Inject 1 pen into the skin every 30 (thirty) days    botulinum toxin type A  (BOTOX ) 200 units injection   INJECT 155 UNITS INTRAMUSCULARLY EVERY 12 WEEKS    Rimegepant Sulfate  75 MG Tablet Dispersible   TAKE 1 TABLET (75 MG) BY MOUTH AS NEEDED (MIGRAINE)      Refill Coordination  Medications Linked to Program: OnabotulinumtoxinA  (BOTOX )  HIPAA verified (patient name & dob or patient name & address) with approved contact: Yes  HIPAA verification: please specify: RQ  How has _______ (specialty medication) helped you manage your _______ (condition) from very well to very poor: Well  Changes to allergies?: No  Changes to medications, herbals or supplements?: No  New conditions (diagnosis) since last pharmacy outreach: No  Since the last fill, has the patient experienced any unplanned office visit, urgent care, emergency room, or hospital admission: Yes  Since the last fill, any new or worsened side effects?: No  Financial problems or insurance changes : No  Since your last fill, how many doses of your specialty medications were missed?: 0  Doses left of specialty medications: 0  Does the patient have the correct number of remaining doses: Yes  Patient confirmations: received welcome packet, patient bill of rights, & privacy practices: Patient has received all  Patient confirmation: patient previously received the drug monograph(s) for their specialty medication(s): Yes    Delivery Information  Delivery confirmation: signature required for delivery: No  Delivery method: [place]  Postal Service  Delivery confirmation: delivery  address: Delivery Address Reviewed & Accurate  Enter delivery address: [facility]  Delivery phone number: [phone]  Delivery confirmation: patient informed of and confirms delivery date. Enter Delivery date: : 12/05/24  Preferred time?: Anytime  Along with the specialty medication listed in this request, are there any additional medication(s) you need refilled now?: No  Number of medications in delivery: 2  Supplies needed?: No supplies needed  Does patient have any concerns about safely storing medications (at the correct temperature, away from children/pets,etc.)?: No  Do any medications need mixed or dated?: No  Financial confirmation: patient informed of financial responsibility and copay amount: Yes  Financial responsibility: copay amount: $0.00  Copay form of payment: Credit card on file  Questions or concerns for the pharmacist?: No  Are any medications first time fills?: No  Additional assessment notes : 1/84, $0.00        Medicare Part B Fill? No    Hailey [name]

## 2024-11-26 ENCOUNTER — Encounter (INDEPENDENT_AMBULATORY_CARE_PROVIDER_SITE_OTHER): Payer: Self-pay | Admitting: Family Medicine

## 2024-11-26 ENCOUNTER — Other Ambulatory Visit: Payer: Self-pay

## 2024-11-26 ENCOUNTER — Ambulatory Visit: Payer: BLUE CROSS/BLUE SHIELD

## 2024-11-26 ENCOUNTER — Ambulatory Visit (INDEPENDENT_AMBULATORY_CARE_PROVIDER_SITE_OTHER): Payer: BLUE CROSS/BLUE SHIELD | Admitting: Family Medicine

## 2024-11-26 VITALS — BP 145/92 | HR 99 | Temp 99.9°F | Resp 18 | Ht 67.0 in | Wt 315.0 lb

## 2024-11-26 DIAGNOSIS — R030 Elevated blood-pressure reading, without diagnosis of hypertension: Secondary | ICD-10-CM

## 2024-11-26 DIAGNOSIS — J111 Influenza due to unidentified influenza virus with other respiratory manifestations: Secondary | ICD-10-CM

## 2024-11-26 LAB — STREP A POCT GOHEALTH: Rapid Strep A Screen POCT: NEGATIVE

## 2024-11-26 LAB — POC COVID QUICKVUE ANTIGEN: QuickVue SARS COV2 Antigen POCT: NEGATIVE

## 2024-11-26 LAB — POCT INFLUENZA A/B
POCT Rapid Influenza A AG: POSITIVE — AB
POCT Rapid Influenza B AG: NEGATIVE

## 2024-11-26 MED ORDER — BENZONATATE 200 MG PO CAPS: 200.0000 mg | ORAL_CAPSULE | Freq: Three times a day (TID) | ORAL | 0 refills | Status: AC | PRN

## 2024-11-26 MED ORDER — OSELTAMIVIR PHOSPHATE 75 MG PO CAPS: 75.0000 mg | ORAL_CAPSULE | Freq: Two times a day (BID) | ORAL | 0 refills | Status: AC

## 2024-11-26 NOTE — Progress Notes (Signed)
 [facility]  OFFICE NOTE         Subjective   Historian: Patient      Chief Complaint   Patient presents with    Illness     CO/ sore throat , lost the voice, cough( productive) , chest and nasal feels congested , nausea and diarrhea and denied vomiting , fever, cold sweat , throat looks red x 4 days        Illness      Hailey [name] is a 44 y.o. female who presents for sore throat, cough and congestion that started yesterday. Denies abdominal pain. Having several episodes of diarrhea. No vomiting.    History:  Medications and Allergies reviewed.   Pertinent Past Medical, Surgical, Family and Social History were reviewed.        Objective     Vitals:    11/26/24 1110   BP: (!) 145/92   Pulse: 99   Resp: 18   Temp: 99.9 F (37.7 C)   TempSrc: Oral   SpO2: 97%   Weight: (!) 142.9 kg (315 lb)   Height: 1.702 m (5' 7)      Body mass index is 49.34 kg/m.          Physical Exam  Constitutional:       Appearance: Normal appearance.   HENT:      Head: Normocephalic and atraumatic.      Mouth/Throat:      Pharynx: Posterior oropharyngeal erythema present. No oropharyngeal exudate.      Tonsils: No tonsillar exudate or tonsillar abscesses.      Comments: Mucous membranes moist and perfused   Cardiovascular:      Rate and Rhythm: Normal rate and regular rhythm.   Pulmonary:      Effort: Pulmonary effort is normal. No respiratory distress.      Breath sounds: Normal breath sounds. No stridor. No wheezing, rhonchi or rales.   Musculoskeletal:      Cervical back: Normal range of motion and neck supple.   Neurological:      Mental Status: She is alert.     Urgent Care Course   LABS  The following POCT tests were ordered, reviewed and discussed with the patient/family.     Results for orders placed or performed in visit on 11/26/24 (from the past 24 hours)   POCT Influenza A/B    Collection Time: 11/26/24 11:40 AM   Result Value    POCT QC Pass    POCT Rapid Influenza A AG Positive (A)    POCT Rapid Influenza B AG  Negative   QuickVue SARS-COV-2 Antigen POCT    Collection Time: 11/26/24 11:40 AM   Result Value    QuickVue SARS COV2 Antigen POCT Negative   Rapid Strep A POCT    Collection Time: 11/26/24 11:41 AM   Result Value    POCT QC Pass    Rapid Strep A Screen POCT Negative     There were no x-rays reviewed with this patient during the visit.      Procedures   Procedures     Assessment / Plan     Differential Diagnoses including but not limited to: Consistent with influenza A. No evidence of acute abdomen, bacterial infection or pneumonia.     Tamiflu  asap. Tessalon  PRN for cough. Hx of reactive airway. Start AirSupra . Return to clinic in 3 days if symptoms fail to improve or worsen. Push fluids. Imodium PRN for diarrhea.  Blood pressure is elevated in center today. Monitor at home and follow up with primary care physician.       Diagnoses and all orders for this visit:  Flu  -     Rapid Strep A POCT; Future  -     POCT Influenza A/B; Future  -     QuickVue SARS-COV-2 Antigen POCT; Future  -     oseltamivir  (TAMIFLU ) 75 MG capsule; Take 1 capsule (75 mg) by mouth 2 (two) times daily for 5 days  -     benzonatate  (TESSALON ) 200 MG capsule; Take 1 capsule (200 mg) by mouth 3 (three) times daily as needed for Cough  Elevated BP without diagnosis of hypertension        The indications for early follow-up with PCP and return to UC were discussed. Patient/family received education on the working diagnosis, diagnostic uncertainties, and proposed treatment plan. Indications for emergency evaluation in the ED were reviewed. Written and verbal discharge instructions were provided and discussed and all questions from the patient/family were addressed, with no apparent barriers.

## 2024-11-26 NOTE — Patient Instructions (Signed)
Return to normal activities when, for at least 24 hours, both are true:  -Your symptoms are getting better overall.  -You have not had a fever without use of fever reducing medications.    When you go back to your normal activities, take added precaution over the next 5 days, such as taking additional steps for cleaner air, hygiene, masks, physical distancing, and/or testing when you will be around other people indoors.   https://www.cdc.gov/media/releases/2024/p0301-respiratory-virus.html    If you develop difficulty breathing or chest pain, call 911 or go to the ER.

## 2024-11-27 ENCOUNTER — Encounter (INDEPENDENT_AMBULATORY_CARE_PROVIDER_SITE_OTHER): Payer: Self-pay | Admitting: Internal Medicine

## 2024-11-27 ENCOUNTER — Other Ambulatory Visit: Payer: Self-pay

## 2024-11-27 LAB — INFLIXIMAB LEVEL AND ANTI-DRUG ANTIBODY FOR IBD
Infliximab Antibody: 13 [AU] — ABNORMAL HIGH (ref <10)
Infliximab Level: 5.1 ug/mL

## 2024-12-01 ENCOUNTER — Other Ambulatory Visit: Payer: Self-pay

## 2024-12-03 ENCOUNTER — Ambulatory Visit: Payer: BLUE CROSS/BLUE SHIELD

## 2024-12-04 ENCOUNTER — Other Ambulatory Visit: Payer: Self-pay

## 2024-12-05 ENCOUNTER — Encounter: Payer: Self-pay | Admitting: Internal Medicine

## 2024-12-05 ENCOUNTER — Other Ambulatory Visit: Payer: Self-pay

## 2024-12-06 ENCOUNTER — Ambulatory Visit (INDEPENDENT_AMBULATORY_CARE_PROVIDER_SITE_OTHER): Payer: BLUE CROSS/BLUE SHIELD | Admitting: Internal Medicine

## 2024-12-06 ENCOUNTER — Encounter (INDEPENDENT_AMBULATORY_CARE_PROVIDER_SITE_OTHER): Payer: Self-pay | Admitting: Internal Medicine

## 2024-12-06 ENCOUNTER — Ambulatory Visit (INDEPENDENT_AMBULATORY_CARE_PROVIDER_SITE_OTHER): Payer: BLUE CROSS/BLUE SHIELD

## 2024-12-06 VITALS — BP 140/90 | HR 94 | Temp 97.3°F | Ht 66.8 in | Wt 315.0 lb

## 2024-12-06 DIAGNOSIS — M352 Behcet's disease: Secondary | ICD-10-CM

## 2024-12-06 DIAGNOSIS — M0600 Rheumatoid arthritis without rheumatoid factor, unspecified site: Secondary | ICD-10-CM

## 2024-12-06 DIAGNOSIS — R0602 Shortness of breath: Secondary | ICD-10-CM

## 2024-12-06 DIAGNOSIS — R0600 Dyspnea, unspecified: Secondary | ICD-10-CM

## 2024-12-06 DIAGNOSIS — R053 Chronic cough: Secondary | ICD-10-CM

## 2024-12-06 MED ORDER — DOXYCYCLINE MONOHYDRATE 100 MG PO CAPS: 100.0000 mg | ORAL_CAPSULE | Freq: Two times a day (BID) | ORAL | 0 refills | Status: AC

## 2024-12-06 MED ORDER — AZELASTINE-FLUTICASONE 137-50 MCG/ACT NA SUSP: 1.0000 | Freq: Two times a day (BID) | NASAL | 11 refills | Status: AC

## 2024-12-06 NOTE — Six Minute Walk (Signed)
 Six Minute Walk Test     Date: 12/06/2024    Clinician: Giovany Cosby el bakkouri 6 Minute Walk (select one):   O2Sensor: Forehead   O2 Delivery Device:       Supportive Device:         Time FiO2/Liters per Minute (LPM) Distance Blood Pressure SpO2 Heart Rate Borg Pause   Baseline 0.21 Feet Meter    140/90 98 90 1 0   1 min 0.21 235 71.628     97 109 2 0   2 min 0.21       97 111 2 0   3 min 0.21 375 114.3     98 115 2 0   6 min 0.21 895 272.796     97 117 3 0   Early Termination Time:                   1 min Recovery 0.21      131/82 98 95 1 0       Comments:    Provider Interpretation:   Distance: Reduced for patient's age and height   Saturation: Normal at rest and Patient did not desaturate during    Heart Rate: Elevated response    Borg: Patient had mild dyspnea during 

## 2024-12-07 ENCOUNTER — Encounter: Payer: Self-pay | Admitting: Internal Medicine

## 2024-12-09 ENCOUNTER — Encounter: Payer: Self-pay | Admitting: Family Nurse Practitioner

## 2024-12-09 ENCOUNTER — Ambulatory Visit: Payer: BLUE CROSS/BLUE SHIELD | Admitting: Family Nurse Practitioner

## 2024-12-09 ENCOUNTER — Other Ambulatory Visit: Payer: Self-pay

## 2024-12-09 VITALS — BP 152/97 | Ht 66.0 in

## 2024-12-09 DIAGNOSIS — G43109 Migraine with aura, not intractable, without status migrainosus: Secondary | ICD-10-CM

## 2024-12-09 DIAGNOSIS — F458 Other somatoform disorders: Secondary | ICD-10-CM

## 2024-12-09 DIAGNOSIS — G43711 Chronic migraine without aura, intractable, with status migrainosus: Secondary | ICD-10-CM

## 2024-12-09 DIAGNOSIS — M352 Behcet's disease: Secondary | ICD-10-CM

## 2024-12-09 MED ORDER — UBRELVY 100 MG PO TABS: 100.0000 mg | ORAL_TABLET | ORAL | 11 refills | Status: AC | PRN

## 2024-12-09 NOTE — Progress Notes (Signed)
 Prior Authorization Details    Which medication is the prior authorization for? ubrogepant  (Ubrelvy ) 100 MG tablet       What is the patient's pharmacy benefit? FEP BC/BS       Prior Auth Status: Submitted       Submission Method: Fax

## 2024-12-09 NOTE — Patient Instructions (Signed)
Our plan:     -You were injected with Botox today    -You may have pain and discomfort at the injection sites    -Please do not rub the injection sites    -Common side effects include headache pain, neck pain, or weakness    -If you have any side effects of concerns, please call the office and let me know    -Please track your headache days using a headache log and bring that information to your follow up visit    -Please return in 12 weeks for repeat Botox injection     Discussed the importance of lifestyle modifications:  - sleep hygiene ( keep a regular sleep schedule) maintaining appropriate hydration,   - avoid overuse of caffeine,   - eat regular meals - minimizing processed foods and emphasizing lean proteins and fruits and vegetables.     --Keep headache diary, record frequency of headache, location, duration, intensity, preceding symptoms (aura occurrence), triggers and pain relieving measures.  -- recommend Migraine Buddy app     -Remember to limit acute headache treatment medicines (like triptans or anti-inflammatories) to 2-3 days/week to avoid medication-overuse headache.         For migraine education resources:  American Headache Society: https://americanheadachesociety.org  American Migraine Foundation: https://americanmigrainefoundation.org    Today's Visit:      In today's visit, I reviewed your medications and records relating your health - prior testing, blood work, reports of other health care providers present in your electronic medical record.     If you have pertinent records from any non-[facility] doctors that you would like to review, please have them sent to us or bring to them to your next office visit.     A copy of today's visit will be sent to your referring doctor and/or primary care doctor, if you have one listed in our system.      Patient satisfaction survey:      If you receive a patient satisfaction survey, I would greatly appreciate it if you would complete it. We value your  feedback.     Contact me online:      Patient Portal online - Please sign up for MyChart -- this is the best way to communicate with your team here.  There is a messaging feature, where you can us messages anytime of day.  It is the best way to communicate with us and get test results, medication refills, or ask questions.    If you are having a medical emergency -- call 911, DO NOT SEND A MESSAGE THROUGH MYCHART.     Coupons for medication:      If you have any trouble affording your medications, check out www.goodrx.com for coupons and competitive prices in your area.  If you need further assistance, let us know so we can work with you and your insurance to make sure you get the best care.    Lea Oda  FNP-C  [facility]   [facility]: [phone]  [place]: [phone]

## 2024-12-09 NOTE — Progress Notes (Signed)
 The prescription has been received by the Pharmacy Care Team. A benefit investigation is currently in process.    To follow up on the outcome of the benefit investigation, please check Episodes, Referrals or specialty pharmacy encounter section in the Patient chart review

## 2024-12-10 ENCOUNTER — Encounter: Payer: Self-pay | Admitting: Neurology

## 2024-12-10 ENCOUNTER — Ambulatory Visit: Payer: BLUE CROSS/BLUE SHIELD | Admitting: Neurology

## 2024-12-10 ENCOUNTER — Ambulatory Visit: Payer: BLUE CROSS/BLUE SHIELD

## 2024-12-10 ENCOUNTER — Other Ambulatory Visit: Payer: Self-pay

## 2024-12-10 VITALS — BP 143/92 | HR 90 | Temp 97.8°F | Resp 14

## 2024-12-10 DIAGNOSIS — D84821 Immunodeficiency due to drugs: Secondary | ICD-10-CM

## 2024-12-10 DIAGNOSIS — G4711 Idiopathic hypersomnia with long sleep time: Secondary | ICD-10-CM

## 2024-12-10 DIAGNOSIS — M352 Behcet's disease: Secondary | ICD-10-CM

## 2024-12-10 DIAGNOSIS — R413 Other amnesia: Secondary | ICD-10-CM

## 2024-12-10 DIAGNOSIS — G4726 Circadian rhythm sleep disorder, shift work type: Secondary | ICD-10-CM

## 2024-12-10 DIAGNOSIS — G43709 Chronic migraine without aura, not intractable, without status migrainosus: Secondary | ICD-10-CM

## 2024-12-10 MED ORDER — MODAFINIL 200 MG PO TABS: ORAL_TABLET | ORAL | 1 refills | Status: AC

## 2024-12-10 MED ORDER — PREDNISONE 1 MG PO TABS: ORAL_TABLET | ORAL | 3 refills | Status: AC

## 2024-12-10 NOTE — Patient Instructions (Signed)
 [facility]  MULTIPLE SCLEROSIS & NEUROIMMUNOLOGY CENTER  Telephone: [phone]  Fax: [phone]  Send us  a mychart or inbasket message for the fastest response  ~~~~~~~~~~~~~~~~~~~~~~~~~~~~~~~~~~~~~~~~~~~~~~~~~~~~~~~~~~~~~~~~~~    WELCOME TO THE NEUROIMMUNOLOGY CLINIC!    MULTIPLE SCLEROSIS AND NEUROIMMUNE DISEASE UPDATE  (Updated January 16, 2024)     1) Clinic Procedures  We only do in-person visits   For over 2000 years, medicine has been a face to face, personal enterprise -- and the best medicine happens face to face. Although telemedicine is very convenient (and was necessary during the pandemic), I've seen poor outcomes with telemedicine care. In-person evaluations are critical part of you doing well. As such we do not offer video visits. Anything minor that does not require a visit can be handled over the MyChart patient portal.     Regular, ongoing, follow up visits is also important. Each person is different, but we see the average patient every 3-6 months. We may not refill controlled substances if you haven't been seen within 3-6 months, and we may not refill other medications if you haven't been seen within 12 months.    Let us  know if your insurance changes. If you lose insurance and live in [place], [place] offers [facility] via the [employer]. [place] [facility] [place] , [place] & [facility] offer similar for patients living in other areas). See [email or web address]    Reaching us   - Mychart is best. We will respond within 2-3 business days. (We do not use regular email as policy)  - My nurses' direct phone [phone]. The main neurology line is [phone]. Leave a message and we will get back to you in a few days.  - After hours, the main line [phone]) will page the on-call neurologist.    You will probably hear back from my nurses. I communicate with them throughout the day. The advice they give you is coming  from me - please take it seriously.    There is a 2 week turnaround time for any forms.     Emergency / After Hours Care.   If you have questions after hours, call the main number [phone]) and you will reach the on-call neurologist who can help you.    If you have severe, acute or debilitating symptoms, go to the emergency room. But also let us  know via mychart or a phone call. I can best help you if you are at an [facility] hospital. You might not have a choice where the ambulance takes you; if you are not at an [facility] hospital, I'll try to help you the best I can. But I probably can't see everything that's going on.    Urgent appointments are available within a few days. My nurses have access to more appointment slots than the call center does.     Testing. Details matter.  The tests I order are specific and high-quality MS care requires advanced technology    - MRI's: we prefer to work with [facility] radiology or [place] imaging. Some of our patients are enrolled in studies at [facility], and that works as well. If you have had outside MRI's done previously, please bring the CD's so that we can upload them.    - LABS: Go to the lab indicated on the top of your lab order. Do not take an [facility] lab slip to [facility]- they will do it incorrectly. Do not go to [facility] or your primary care doctor. Most patients are sent to [facility]  lab.    - Results of testing will be communicated once they all return at follow-up visits. Many of the tests are nuanced and must be interpreted within the overall clinical picture. I do not call for results.      2) Testing Phone Numbers  MRI & CT:  [facility] at [phone]  [facility] Fax: [phone]  [facility] at [phone]  For insurance approval, reach out to them first. If there is a denial, let us  know ASAP as we can help.     LP / Spinal Tap: Call [phone] for Specialized Imaging Scheduling. Backup is [facility] at [phone]    Labs: Any [place] location.  [email or web address]    3) Doing well with Neuroimmune Disease  Symptoms don't tell you how the disease is doing.  You can experience more symptoms even if the disease isn't worsening. Fluctuating symptoms are expected. Environmental factors like infections, extremes of heat or cold, stress (emotional or physiologic), fatigue, infections, other medical diseases, and certain foods may make you feel more symptoms. That's not the disease worsening, it's not more neurologic damage, and it's not a relapse. Those are worsening symptoms. Most neuroimmune patients have good days & bad days. You might need symptomatic medications to help you function well.    Also, some diseases worsen without producing more symptoms.     In general, if you have a question about your symptoms or notice any new symptoms, please reach out.      Monitoring the Disease and Medication Response:  MS and neuroimmune diseases are caused by your body's immune system attacking the nervous system (brain, spinal cord, nerves or muscles). We use immune treatments to prevent more damage. We can measure whether those treatments are working through objective testing - labs, MRI's, EMGs, etc.    For MS, NMO or MOG, we know that everyone develop more lesions, neurologic problems on exam and symptoms unless they are treated (Dr Oneil Alas in [place] proved this). MRI is one of the best ways of detecting more damage - it shows up as a new lesion. The clinical exam and labs can also help us . In MS and MOG, a relapse without symptoms is very common, so we do frequent labs and yearly MRIs to monitor the disease. This is very important to detect subclinical progression of disease.     For other neuroimmune diseases -- eg,  myasthenia, CIDP/GBS, or rheumatologic diseases -- not everyone requires treatment. But we use labs, exam, MRI's and/or EMG to monitor the diseases    Medications are the start, but they aren't  everything. They won't repair damage that already happened, and they may not help symptoms. It is to prevent more damage.     Physical and Cognitive Activity Promote Neurologic Recovery:   In order to improve the disease, the brain has to rewire around the damaged nerves. The best way to do this is by being active - physically and mentally. Physical, occupational or cognitive therapy can teach you exercises to address specific problems. Physical activity has been shown to improve energy, mood, and reduce pain. Be as physically and mentally active as you can; learn to pace yourself.    Diet:   There is no such thing as an MS diet or anti-inflammatory diet. Foods don't affect MRI lesions or prevent neurologic damage, but a good or bad diet can markedly impact symptoms. My suggestion is to eat healthy - we all know what that is! Diets like  the Wahl's protocol or Swank diet have ideas worth trying, but don't feel obligated to follow them precisely. See if adding fiber, reducing gluten, reducing dairy or eliminating processed foods makes a difference in how you feel.    Smoking makes MS worse - it causes more lesions & bigger lesions.     Cannabis worsens cognitive symptoms of MS (Brain fog).    Vitamins and Supplements  Vitamins can affect neurologic functioning:  - Vitaimn D and MS. High vit D levels correlate with better outcomes in MS patients. Target a total (25-OH) Vit D level between 50-100. Vitamin D3 (CHOLEcalciferol; white pill) works better than Vitamin D2 (ERGOcalciferol ; green pill). You can get high doses of vitamin D3 from amazon (eg, 10,000 or 50,000 units).     - Target vit B12 level above 500. Levels below 400 can cause nerve & spinal cord degeneration. Levels above the normal range are perfectly safe.    - Vitamin B6 (pyridoxine) and zinc toxicity cause neuropathy. Do not take B-complex or zinc. Other sources include multi-vitamins, some energy drinks, and other supplements. Check the back of the  bottle, not just the front. Candance Cary CD. Pyridoxine toxicity courtesy of your local health food store Ann Rheum Dis. 2006 Dec;65(12):1666-7]    - marked copper deficiency also causes nerve & spinal cord degeneration    - the data around other supplements like biotin, alpha-lipoic acid, etc is unclear, so I generally don't recommend them    Support Groups  NMSS [place] (via Zoom) Elveria Sever. Her cell is [phone].  MS Alliance of [place] - [place] / [place] / Donneta GLENWOOD Elvie Cherilyn [phone]   NMSS [place] Kim Kiggins. [phone]. [email or web address]  NMSS [place]  / online Speak MS To Me. Odella Geralynn Blush. [phone]. [email or web address]. Monday 2 PM    National MS Society [facility]): malpracticeboard.com.au  Myasthenia Gravis Foundation of America (MGFA): moralgame.si  [employer] for NMO: https://www.sumairafoundation.org/  [employer] for NMO: https://guthyjacksonfoundation.org/  Foundation for [employer] Research  discfull.com.cy    3) Neurologic Disease, Immune Suppressants and Infections  - MS, NMO, MOG, and GBS/CIDP themselves do not increase your risk of infection (including COVID). Severe myasthenia might increase your risk for lung infections    - Call us  if you develop a fever more than 101 F, have a green-yellow cough longer than 1 week, or if you have recurring or difficult-to-treat infections. Immune suppressants can increase infection risk. In this situation, you will probably require antibiotics (and stronger and longer than normal).    - None of the MS medications increase your COVID risk above that of an untreated patient (COVIMS study)    - Infections can cause more symptoms. That's usually not disease worsening or neurologic damage from infection. That is symptom worsening, similar to what happens with stress or the heat. You will get better a few days after  the infection improves.    - Rarely, COVID can cause neurologic complications. Early on, with severe COVID, we saw a lot of strokes and seizures. We see less of that now. But, we are seeing the emergence of a post-COVID neurologic syndrome that looks like Guillain-Barre syndrome or transverse myelitis. The way to test for this is with a spinal tap (lumbar puncture). Neuro-COVID is treatable.    4) Vaccines  You don't need to time your vaccinations with infusion (or mavenclad) dates. There have been theoretical concerns about immunosuppression affecting vaccine effectiveness, but recent research has shown this not to be  true.  Re Mavenclad: The contraindication and timing requirements do not apply to most vaccines. They are only for live virus vaccines, which are contraindicated anwyays    Safe Vaccines, regardless of immunosuppressant  - Pfizer, Moderna, Novavax and Booster COVID vaccines are safe in MS and with all immunosuppressants. (There were issues with J&J and Astra-Zeneca).    - Flu vaccine. Use Flublok brand only (recombinant hemagluttin quadrivalent). This is per FDA guidance for all neurologic patients. It can be difficult to find and we can give you an RX for it.     - Shingles vaccine. Use Shingrix (RZV, recombinant zoster) vaccine. This is per FDA guidance for all neurologic patients.     - All pneumonia vaccines are safe (Prevnar & Capvaxive )    - Tetanus toxoid and TDAP are ok (TDAP is preferred over DTAP).     Unsafe Vaccines (for any neuroimmune patients)  - Live virus or inactivated virus vaccines are contraindicated in neuro-immunologic patients (Including measles vaccine - this is contraindicated)    - monkeypox vaccines (JYNNEOS and ACAM2000) are contraindicated in MS and patients on immunosupressants.     - RSV vaccines are associated with cases of Guillian Barre syndrome, so I would avoid them.     - J&J and Astra Zeneca COVID vaccine were associated with transverse myelitis    5) Other  Notes:  - MS is compatible with pregnancy but we need to know if you plan on getting pregnant or become pregnant as we may need to adjust your treatment. Certain disease modifying treatments for MS are better than others for those seeking to become pregnant. Please also let me know when you find out you're pregnant.     - I do not prescribe long term opioids for nerve pain. Prescription opioids lead to worse MS symptoms, with studies showing significantly worse fatigue scores and worse pain scores compared to non-opioid users. Neurological pain is best managed by evidence-based non-opiate neuropathic pain medications. Complicated chronic pain management or non-neurological pain management will be referred to pain clinic.     - I do not recommend the use of marijuana or ingestion of THC /CBD containing products. Studies show marijuana worsen cognitive scores in MS and contribute to worsened brain fog and fatigue. Studies do not support the use of medical marijuana for spasticity or pain given a lack of objective efficacy to relieving spasticity. Topical CBD creams and ointments available over hte counter are safer as very little gets into your bloodstream. Instead, we use a comprehensive evidence-based approach in managing spasticity and pain that is individualized to the patient.      6) Phone Numbers  Drug Patient Assistance Programs  If you run into insurance / supply issues with a branded DMT, you may be able to get a temporary supply from the manufacturer. Also call us  & your infusion center.     Briumvi ([employer]). [phone]. Fax [phone]    Soliris & Ultomiris ([employer] One Source). [phone]. Fax [phone]. [email or web address]    Uplizna ([employer] / [employer] By Your Side) [phone]. Fax [phone]    Enspryng [facility] Access Solution) [phone]. Fax [phone]. Text [phone]    Zilbrysq ([facility], Onward). [phone]. Fax [phone].     Kesimpta ([employer] Alongside).  [phone]. Fax [phone]    Mavenclad (EMD Serono MS Lifelines). [phone]. Fax [phone]    Ocrevus  & Ocrevus  Zunovo [facility] [employer]). [phone]. Fax [phone]. Text [phone]    Zeposia ([employer] One Source) [phone]; Fax [phone]  Vyvgart & Vyvgart Hytrulo (Ar[employer]x MyPath) [phone]. Fax [phone]    Infusion Centers  [facility]  [address] [place], [place] [address]  Phone: [phone]    [facility] Fax [phone]    Devonne Perry @ [employer] Phone Number: [phone]. Fax [phone]    [facility]  Address: [address] [place], [place] [address] and other locations  Phone: [phone]    Fax: [phone]    [facility]  Fax: [phone]  Phone: [phone] or [phone]    [facility]  Phone: [phone]  Fax: [phone]    Physical Therapy  [facility] [phone]   [facility]   [phone]   [facility] [phone]   [facility]   [phone]   [facility]    [phone]     Dr Truman Hopkins has home-PT tips for MS  [email or web address]    Thank you,      Sammi Lockwood, MD PHD  Director, Neuroimmunology & MS Center  [facility]  [address], [place], [place] [address]    Telephone [phone]  Fax [phone]   The fastest response is via national city

## 2024-12-11 ENCOUNTER — Telehealth: Payer: Self-pay

## 2024-12-11 LAB — SPIROMETRY
FEF 25-75% (Pre-Bronch) %Pred: 70 %
FEF 25-75% (Pre-Bronch) Actual: 2.23 L/s
FEF 25-75% (Pre-Bronch) Pred: 3.15 L/s
FEF Max (Pre-Bronch) %Pred: 89 %
FEF Max (Pre-Bronch) Actual: 5.14 L/s
FEF Max (Pre-Bronch) Pred: 5.73 L/s
FEF2575 (Lower Limit of Normal): 1.84 L/s
FEF2575 (Standard Deviation): 0.89 L/s
FEFMax (Lower Limit of Normal): 3.87 L/s
FEFMax (Standard Deviation): 1.13 L/s
FEV1 (Lower Limit of Normal): 2.5 L
FEV1 (Pre-Bronch) %Pred: 70 %
FEV1 (Pre-Bronch) Actual: 2.23 L
FEV1 (Pre-Bronch) Pred: 3.17 L
FEV1 (Standard Deviation): 0.4 L
FEV1/FVC (Pre-Bronch) %Pred: 100 %
FEV1/FVC (Pre-Bronch) Actual: 81 %
FEV1/FVC (Pre-Bronch) Pred: 81 %
FVC (Lower Limit of Normal): 3.12 L
FVC (Pre-Bronch) %Pred: 69 %
FVC (Pre-Bronch) Actual: 2.74 L
FVC (Pre-Bronch) Pred: 3.94 L
FVC (Standard Deviation): 0.51 L
PEF (Pre-Actual): 308.2 L/min

## 2024-12-11 NOTE — Telephone Encounter (Signed)
 Jennyfer Matto (KeyBETHA AVERS)  PA Case ID #: [id number]  Rx #: [id number]  Need Help? Call us  at [phone]  Outcome  Approved on December 04, 2023 by [employer] NCPDP 2017  Your PA request has been approved. Additional information will be provided in the approval communication. (Message 1145)  Authorization Expiration Date: 12/03/2024  Drug  Modafinil  200MG  tablets    Form  [employer] Electronic PA Form [phone] NCPDP)  Original Claim Info  73 FOR OVERRIDE HAVE MD CALL [phone] REQUIRES PRIOR AUTHORIZATION

## 2024-12-12 ENCOUNTER — Encounter (INDEPENDENT_AMBULATORY_CARE_PROVIDER_SITE_OTHER): Payer: Self-pay | Admitting: Family Medicine

## 2024-12-17 ENCOUNTER — Ambulatory Visit: Payer: BLUE CROSS/BLUE SHIELD

## 2024-12-24 ENCOUNTER — Ambulatory Visit: Payer: BLUE CROSS/BLUE SHIELD

## 2024-12-25 ENCOUNTER — Ambulatory Visit (INDEPENDENT_AMBULATORY_CARE_PROVIDER_SITE_OTHER): Payer: BLUE CROSS/BLUE SHIELD | Admitting: Family Medicine

## 2024-12-31 ENCOUNTER — Ambulatory Visit: Payer: BLUE CROSS/BLUE SHIELD

## 2025-01-01 ENCOUNTER — Ambulatory Visit: Payer: BLUE CROSS/BLUE SHIELD

## 2025-01-01 ENCOUNTER — Other Ambulatory Visit (INDEPENDENT_AMBULATORY_CARE_PROVIDER_SITE_OTHER): Payer: BLUE CROSS/BLUE SHIELD

## 2025-01-03 ENCOUNTER — Ambulatory Visit: Payer: BLUE CROSS/BLUE SHIELD

## 2025-01-03 ENCOUNTER — Other Ambulatory Visit (INDEPENDENT_AMBULATORY_CARE_PROVIDER_SITE_OTHER): Payer: BLUE CROSS/BLUE SHIELD

## 2025-01-07 ENCOUNTER — Ambulatory Visit: Payer: BLUE CROSS/BLUE SHIELD

## 2025-01-14 ENCOUNTER — Ambulatory Visit: Payer: BLUE CROSS/BLUE SHIELD

## 2025-01-21 ENCOUNTER — Ambulatory Visit: Payer: BLUE CROSS/BLUE SHIELD

## 2025-01-21 ENCOUNTER — Ambulatory Visit (INDEPENDENT_AMBULATORY_CARE_PROVIDER_SITE_OTHER): Payer: BLUE CROSS/BLUE SHIELD | Admitting: Residents

## 2025-01-28 ENCOUNTER — Ambulatory Visit: Payer: BLUE CROSS/BLUE SHIELD

## 2025-02-04 ENCOUNTER — Ambulatory Visit: Payer: BLUE CROSS/BLUE SHIELD

## 2025-02-10 ENCOUNTER — Ambulatory Visit: Payer: BLUE CROSS/BLUE SHIELD | Admitting: Neurology

## 2025-02-12 ENCOUNTER — Ambulatory Visit: Payer: BLUE CROSS/BLUE SHIELD

## 2025-02-12 ENCOUNTER — Other Ambulatory Visit (INDEPENDENT_AMBULATORY_CARE_PROVIDER_SITE_OTHER): Payer: BLUE CROSS/BLUE SHIELD

## 2025-02-14 ENCOUNTER — Other Ambulatory Visit (INDEPENDENT_AMBULATORY_CARE_PROVIDER_SITE_OTHER): Payer: BLUE CROSS/BLUE SHIELD

## 2025-02-14 ENCOUNTER — Ambulatory Visit: Payer: BLUE CROSS/BLUE SHIELD

## 2025-02-17 ENCOUNTER — Telehealth (INDEPENDENT_AMBULATORY_CARE_PROVIDER_SITE_OTHER): Payer: BLUE CROSS/BLUE SHIELD | Admitting: Internal Medicine

## 2025-03-05 ENCOUNTER — Ambulatory Visit: Payer: BLUE CROSS/BLUE SHIELD | Admitting: Family Nurse Practitioner

## 2025-03-21 ENCOUNTER — Ambulatory Visit (INDEPENDENT_AMBULATORY_CARE_PROVIDER_SITE_OTHER): Payer: BLUE CROSS/BLUE SHIELD | Admitting: Obstetrics and Gynecology

## 2025-05-28 ENCOUNTER — Ambulatory Visit: Payer: BLUE CROSS/BLUE SHIELD | Admitting: No Specialty

## 2025-08-20 ENCOUNTER — Ambulatory Visit: Payer: BLUE CROSS/BLUE SHIELD | Admitting: Family Nurse Practitioner
